# Patient Record
Sex: Male | Born: 1945 | Race: Black or African American | Hispanic: No | Marital: Single | Smoking: Former smoker
Health system: Southern US, Community
[De-identification: ages and names within clinical notes are randomized; demographics above are authoritative.]

## PROBLEM LIST (undated history)

## (undated) DIAGNOSIS — F518 Other sleep disorders not due to a substance or known physiological condition: Secondary | ICD-10-CM

## (undated) DIAGNOSIS — G20A1 Parkinson's disease without dyskinesia, without mention of fluctuations: Secondary | ICD-10-CM

## (undated) DIAGNOSIS — H8110 Benign paroxysmal vertigo, unspecified ear: Secondary | ICD-10-CM

## (undated) DIAGNOSIS — G6189 Other inflammatory polyneuropathies: Secondary | ICD-10-CM

## (undated) DIAGNOSIS — E559 Vitamin D deficiency, unspecified: Secondary | ICD-10-CM

## (undated) DIAGNOSIS — E785 Hyperlipidemia, unspecified: Secondary | ICD-10-CM

## (undated) DIAGNOSIS — M719 Bursopathy, unspecified: Secondary | ICD-10-CM

## (undated) DIAGNOSIS — M67919 Unspecified disorder of synovium and tendon, unspecified shoulder: Secondary | ICD-10-CM

## (undated) DIAGNOSIS — G4752 REM sleep behavior disorder: Secondary | ICD-10-CM

## (undated) DIAGNOSIS — R0602 Shortness of breath: Secondary | ICD-10-CM

## (undated) DIAGNOSIS — G20 Parkinson's disease: Secondary | ICD-10-CM

## (undated) DIAGNOSIS — G622 Polyneuropathy due to other toxic agents: Secondary | ICD-10-CM

## (undated) DIAGNOSIS — R739 Hyperglycemia, unspecified: Secondary | ICD-10-CM

## (undated) HISTORY — PX: ROTATOR CUFF REPAIR: SHX139

## (undated) HISTORY — DX: Benign paroxysmal vertigo, unspecified ear: H81.10

## (undated) HISTORY — DX: Hyperlipidemia, unspecified: E78.5

## (undated) HISTORY — DX: Unspecified disorder of synovium and tendon, unspecified shoulder: M67.919

## (undated) HISTORY — DX: Other inflammatory polyneuropathies: G61.89

## (undated) HISTORY — DX: Vitamin D deficiency, unspecified: E55.9

## (undated) HISTORY — DX: Other inflammatory polyneuropathies: G62.2

## (undated) HISTORY — DX: Shortness of breath: R06.02

## (undated) HISTORY — DX: Hyperglycemia, unspecified: R73.9

## (undated) HISTORY — DX: Unspecified disorder of synovium and tendon, unspecified shoulder: M71.9

## (undated) HISTORY — DX: Parkinson's disease: G20

## (undated) HISTORY — DX: Parkinson's disease without dyskinesia, without mention of fluctuations: G20.A1

## (undated) HISTORY — PX: APPENDECTOMY: SHX54

## (undated) HISTORY — DX: REM sleep behavior disorder: G47.52

## (undated) HISTORY — DX: Other sleep disorders not due to a substance or known physiological condition: F51.8

---

## 2005-12-11 ENCOUNTER — Emergency Department (HOSPITAL_COMMUNITY): Admission: EM | Admit: 2005-12-11 | Discharge: 2005-12-11 | Payer: Self-pay | Admitting: Emergency Medicine

## 2008-02-21 ENCOUNTER — Emergency Department (HOSPITAL_COMMUNITY): Admission: EM | Admit: 2008-02-21 | Discharge: 2008-02-21 | Payer: Self-pay | Admitting: Family Medicine

## 2008-10-14 ENCOUNTER — Emergency Department (HOSPITAL_COMMUNITY): Admission: EM | Admit: 2008-10-14 | Discharge: 2008-10-14 | Payer: Self-pay | Admitting: Family Medicine

## 2008-12-10 ENCOUNTER — Emergency Department (HOSPITAL_COMMUNITY): Admission: EM | Admit: 2008-12-10 | Discharge: 2008-12-10 | Payer: Self-pay | Admitting: Emergency Medicine

## 2009-03-22 ENCOUNTER — Emergency Department (HOSPITAL_COMMUNITY): Admission: EM | Admit: 2009-03-22 | Discharge: 2009-03-22 | Payer: Self-pay | Admitting: Family Medicine

## 2010-02-07 ENCOUNTER — Emergency Department (HOSPITAL_COMMUNITY): Admission: EM | Admit: 2010-02-07 | Discharge: 2010-02-07 | Payer: Self-pay | Admitting: Family Medicine

## 2010-03-24 ENCOUNTER — Emergency Department (HOSPITAL_COMMUNITY): Admission: EM | Admit: 2010-03-24 | Discharge: 2010-03-24 | Payer: Self-pay | Admitting: Emergency Medicine

## 2010-04-02 ENCOUNTER — Emergency Department (HOSPITAL_COMMUNITY): Admission: EM | Admit: 2010-04-02 | Discharge: 2010-04-02 | Payer: Self-pay | Admitting: Family Medicine

## 2010-06-18 ENCOUNTER — Encounter
Admission: RE | Admit: 2010-06-18 | Discharge: 2010-06-18 | Payer: Self-pay | Source: Home / Self Care | Attending: Sports Medicine | Admitting: Sports Medicine

## 2011-03-08 LAB — POCT URINALYSIS DIP (DEVICE)
Glucose, UA: NEGATIVE
Hgb urine dipstick: NEGATIVE
Ketones, ur: 15 — AB
Nitrite: POSITIVE — AB
Operator id: 247071
Protein, ur: >=300 — AB
Specific Gravity, Urine: 1.025
Urobilinogen, UA: 4 — ABNORMAL HIGH
pH: 5.5

## 2011-03-08 LAB — DIFFERENTIAL
Basophils Absolute: 0
Basophils Relative: 0
Eosinophils Absolute: 0
Eosinophils Relative: 0
Lymphocytes Relative: 10 — ABNORMAL LOW
Lymphs Abs: 0.3 — ABNORMAL LOW
Monocytes Absolute: 0.2
Monocytes Relative: 6
Neutro Abs: 2.2
Neutrophils Relative %: 84 — ABNORMAL HIGH

## 2011-03-08 LAB — CBC
HCT: 44.1
Hemoglobin: 15
MCHC: 34.1
MCV: 81.6
Platelets: 104 — ABNORMAL LOW
RBC: 5.41
RDW: 13
WBC: 2.7 — ABNORMAL LOW

## 2011-03-08 LAB — URINE CULTURE: Colony Count: 10000

## 2011-03-29 ENCOUNTER — Inpatient Hospital Stay (INDEPENDENT_AMBULATORY_CARE_PROVIDER_SITE_OTHER)
Admission: RE | Admit: 2011-03-29 | Discharge: 2011-03-29 | Disposition: A | Discharge status: Home / Self Care | Payer: Medicare Other | Source: Ambulatory Visit | Attending: Emergency Medicine | Admitting: Emergency Medicine

## 2011-03-29 DIAGNOSIS — R259 Unspecified abnormal involuntary movements: Secondary | ICD-10-CM

## 2011-08-09 ENCOUNTER — Other Ambulatory Visit: Payer: Self-pay | Admitting: Diagnostic Neuroimaging

## 2011-08-09 DIAGNOSIS — G20 Parkinson's disease: Secondary | ICD-10-CM

## 2011-09-09 ENCOUNTER — Ambulatory Visit
Admission: RE | Admit: 2011-09-09 | Discharge: 2011-09-09 | Disposition: A | Discharge status: Home / Self Care | Payer: Medicare Other | Source: Ambulatory Visit | Attending: Diagnostic Neuroimaging | Admitting: Diagnostic Neuroimaging

## 2011-09-09 DIAGNOSIS — G20 Parkinson's disease: Secondary | ICD-10-CM

## 2012-08-14 ENCOUNTER — Encounter: Payer: Self-pay | Admitting: Internal Medicine

## 2012-09-21 ENCOUNTER — Encounter: Payer: Self-pay | Admitting: Internal Medicine

## 2012-09-21 ENCOUNTER — Ambulatory Visit (AMBULATORY_SURGERY_CENTER): Payer: Medicare Other | Admitting: *Deleted

## 2012-09-21 VITALS — Ht 69.0 in | Wt 253.2 lb

## 2012-09-21 DIAGNOSIS — Z1211 Encounter for screening for malignant neoplasm of colon: Secondary | ICD-10-CM

## 2012-09-21 MED ORDER — PEG-KCL-NACL-NASULF-NA ASC-C 100 G PO SOLR: ORAL | Status: DC

## 2012-09-21 NOTE — Progress Notes (Signed)
No egg or soy allergy 

## 2012-10-05 ENCOUNTER — Encounter: Payer: Medicare Other | Admitting: Internal Medicine

## 2012-10-12 ENCOUNTER — Encounter: Payer: Medicare Other | Admitting: Internal Medicine

## 2012-10-18 ENCOUNTER — Encounter: Payer: Self-pay | Admitting: Internal Medicine

## 2012-10-18 ENCOUNTER — Ambulatory Visit (AMBULATORY_SURGERY_CENTER): Payer: Medicare Other | Admitting: Internal Medicine

## 2012-10-18 VITALS — BP 109/74 | HR 53 | Temp 96.2°F | Resp 13 | Ht 69.0 in | Wt 253.0 lb

## 2012-10-18 DIAGNOSIS — D126 Benign neoplasm of colon, unspecified: Secondary | ICD-10-CM

## 2012-10-18 DIAGNOSIS — Z1211 Encounter for screening for malignant neoplasm of colon: Secondary | ICD-10-CM

## 2012-10-18 MED ORDER — SODIUM CHLORIDE 0.9 % IV SOLN: 500.0000 mL | INTRAVENOUS | Status: DC

## 2012-10-18 MED ORDER — FLEET ENEMA 7-19 GM/118ML RE ENEM
1.0000 | ENEMA | Freq: Once | RECTAL | Status: AC
  Administered 2012-10-18: 1 via RECTAL

## 2012-10-18 NOTE — Progress Notes (Signed)
Patient did not have preoperative order for IV antibiotic SSI prophylaxis. (G8918)  Patient did not experience any of the following events: a burn prior to discharge; a fall within the facility; wrong site/side/patient/procedure/implant event; or a hospital transfer or hospital admission upon discharge from the facility. (G8907)  

## 2012-10-18 NOTE — Progress Notes (Signed)
Called to room to assist during endoscopic procedure.  Patient ID and intended procedure confirmed with present staff. Received instructions for my participation in the procedure from the performing physician.  

## 2012-10-18 NOTE — Progress Notes (Signed)
Pt. admininstered Fleets enema for further evacuation of intestinal contents, light green liquid expelled with no fecal matter present.

## 2012-10-18 NOTE — Patient Instructions (Signed)
YOU HAD AN ENDOSCOPIC PROCEDURE TODAY AT THE [facility]: Refer to the procedure report that was given to you for any specific questions about what was found during the examination.  If the procedure report does not answer your questions, please call your gastroenterologist to clarify.  If you requested that your care partner not be given the details of your procedure findings, then the procedure report has been included in a sealed envelope for you to review at your convenience later.  YOU SHOULD EXPECT: Some feelings of bloating in the abdomen. Passage of more gas than usual.  Walking can help get rid of the air that was put into your GI tract during the procedure and reduce the bloating. If you had a lower endoscopy (such as a colonoscopy or flexible sigmoidoscopy) you may notice spotting of blood in your stool or on the toilet paper. If you underwent a bowel prep for your procedure, then you may not have a normal bowel movement for a few days.  DIET: Your first meal following the procedure should be a light meal and then it is ok to progress to your normal diet.  A half-sandwich or bowl of soup is an example of a good first meal.  Heavy or fried foods are harder to digest and may make you feel nauseous or bloated.  Likewise meals heavy in dairy and vegetables can cause extra gas to form and this can also increase the bloating.  Drink plenty of fluids but you should avoid alcoholic beverages for 24 hours.  ACTIVITY: Your care partner should take you home directly after the procedure.  You should plan to take it easy, moving slowly for the rest of the day.  You can resume normal activity the day after the procedure however you should NOT DRIVE or use heavy machinery for 24 hours (because of the sedation medicines used during the test).    SYMPTOMS TO REPORT IMMEDIATELY: A gastroenterologist can be reached at any hour.  During normal business hours, 8:30 AM to 5:00 PM Monday through Friday,  call [phone].  After hours and on weekends, please call the GI answering service at [phone] who will take a message and have the physician on call contact you.   Following lower endoscopy (colonoscopy or flexible sigmoidoscopy):  Excessive amounts of blood in the stool  Significant tenderness or worsening of abdominal pains  Swelling of the abdomen that is new, acute  Fever of 100F or higher    FOLLOW UP: If any biopsies were taken you will be contacted by phone or by letter within the next 1-3 weeks.  Call your gastroenterologist if you have not heard about the biopsies in 3 weeks.  Our staff will call the home number listed on your records the next business day following your procedure to check on you and address any questions or concerns that you may have at that time regarding the information given to you following your procedure. This is a courtesy call and so if there is no answer at the home number and we have not heard from you through the emergency physician on call, we will assume that you have returned to your regular daily activities without incident.  SIGNATURES/CONFIDENTIALITY: You and/or your care partner have signed paperwork which will be entered into your electronic medical record.  These signatures attest to the fact that that the information above on your After Visit Summary has been reviewed and is understood.  Full responsibility of the confidentiality   of this discharge information lies with you and/or your care-partner.     Hold aspirin products and antiinflammatory products for one week  Information on polyps,diverticulosis,& high fiber diet given to you today

## 2012-10-18 NOTE — Op Note (Signed)
 [facility] [address] [place], [address]   COLONOSCOPY PROCEDURE REPORT  PATIENT: Theodore King, Theodore King  MR#: [id number] BIRTHDATE: 01-12-1946 , 67  yrs. old GENDER: Male ENDOSCOPIST: Beverley Fiedler, MD REFERRED AO:[id number] Reed, M.D. PROCEDURE DATE:  10/18/2012 PROCEDURE:   Colonoscopy with snare polypectomy ASA CLASS:   Class II INDICATIONS:average risk screening and first colonoscopy. MEDICATIONS: MAC sedation, administered by CRNA and propofol (Diprivan) 250mg  IV  DESCRIPTION OF PROCEDURE:   After the risks benefits and alternatives of the procedure were thoroughly explained, informed consent was obtained.  A digital rectal exam revealed no rectal mass.   The LB CF-[id number]  endoscope was introduced through the anus and advanced to the cecum, which was identified by both the appendix and ileocecal valve. No adverse events experienced. The quality of the prep was good, using MoviPrep  The instrument was then slowly withdrawn as the colon was fully examined.  COLON FINDINGS: Two sessile polyps measuring 8 and 4 mm in size were found in the ascending colon and at the hepatic flexure. Polypectomy was performed using cold snare.  All resections were complete and all polyp tissue was completely retrieved.   Moderate diverticulosis was noted in the descending colon and sigmoid colon. Retroflexed views revealed no abnormalities. The time to cecum=2 minutes 26 seconds.  Withdrawal time=10 minutes 00 seconds.  The scope was withdrawn and the procedure completed. COMPLICATIONS: There were no complications.  ENDOSCOPIC IMPRESSION: 1.   Two sessile polyps measuring 8 and 4 mm in size were found in the ascending colon and at the hepatic flexure; Polypectomy was performed using cold snare 2.   Moderate diverticulosis was noted in the descending colon and sigmoid colon  RECOMMENDATIONS: 1.  Hold aspirin, aspirin products, and anti-inflammatory medication for 1  week. 2.  Await pathology results 3.  High fiber diet 4.  If the polyps removed today are proven to be adenomatous (pre-cancerous) polyps, you will need a repeat colonoscopy in 5 years.  Otherwise you should continue to follow colorectal cancer screening guidelines for "routine risk" patients with colonoscopy in 10 years.  You will receive a letter within 1-2 weeks with the results of your biopsy as well as final recommendations.  Please call my office if you have not received a letter after 3 weeks.   eSigned:  Beverley Fiedler, MD 10/18/2012 10:18 AM  cc: The Patient

## 2012-10-19 ENCOUNTER — Telehealth: Payer: Self-pay | Admitting: *Deleted

## 2012-10-19 NOTE — Telephone Encounter (Signed)
  Follow up Call-  Call back number 10/18/2012  Post procedure Call Back phone  # [phone]  Permission to leave phone message Yes     Patient questions:  Do you have a fever, pain , or abdominal swelling? no Pain Score  0 *  Have you tolerated food without any problems? yes  Have you been able to return to your normal activities? yes  Do you have any questions about your discharge instructions: Diet   no Medications  no Follow up visit  no  Do you have questions or concerns about your Care? no  Actions: * If pain score is 4 or above: No action needed, pain <4.

## 2012-10-24 ENCOUNTER — Encounter: Payer: Self-pay | Admitting: Internal Medicine

## 2012-10-26 ENCOUNTER — Ambulatory Visit (INDEPENDENT_AMBULATORY_CARE_PROVIDER_SITE_OTHER): Payer: Medicare Other | Admitting: Diagnostic Neuroimaging

## 2012-10-26 ENCOUNTER — Encounter: Payer: Self-pay | Admitting: Diagnostic Neuroimaging

## 2012-10-26 VITALS — BP 151/74 | HR 71 | Temp 98.1°F | Ht 71.5 in | Wt 255.0 lb

## 2012-10-26 DIAGNOSIS — G20A1 Parkinson's disease without dyskinesia, without mention of fluctuations: Secondary | ICD-10-CM | POA: Insufficient documentation

## 2012-10-26 DIAGNOSIS — G20 Parkinson's disease: Secondary | ICD-10-CM

## 2012-10-26 NOTE — Progress Notes (Signed)
[facility]  PATIENT: Theodore King DOB: 1946/05/15  REFERRING CLINICIAN:  HISTORY FROM: patient  REASON FOR VISIT: routine follow up   HISTORICAL  CHIEF COMPLAINT:  Chief Complaint  Patient presents with  . Follow-up    tremors#6    HISTORY OF PRESENT ILLNESS:   UPDATE 10/26/12: Patient is doing extremity well since last visit. Increased carbidopa/levodopa dosing has essentially removed his tremors. He very rarely feels tremor in his left arm. Patient is competed in the senior games over past few months and won [employer] (including several gold). He won the [employer] after competing for the first time in his life. 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 Overall doing great.  UPDATE 05/10/12: Feels just a little better, continues with mild tremors.  Denies any falls.  He continues to work out 3-4 times per week.  His only complaint today is he continues to feel embarassed with his tremors.    UPDATE 03/06/12:  Feels like doing better with the Pramipixole.  Continues to have mild tremor.  Denies any falls.  Works out 3-4 days per week. He does feel that his tremors can improve more to help him to not feel embarrased.    UPDATE 01/04/12: Couldn't afford meds in end of june and for last 3 weeks of June was taking less meds than rx'd. Tremor worsened. Now has insurance and getting better tremor control, but still present. Still with int drooling. No constipation. No wearing off. No dyskinesias. No constipation.  UPDATE 10/27/11: Doing about the same. Not much benefit with carb/levo. No side effects. Still with tremor (rest and action). No falls. Works out 3x per week. Recent gold medal at [employer].  PRIOR HPI: 67 year old left-handed male with no past medical history, here for evaluation of tremor.  Since November 2012, patient has developed progressive tremor in his left hand greater than  right hand.  He also feels slower with fine finger movements and coordination.  His girlfriend is noticed short shuffling steps with walking.  Symptoms have been progressive over last few months.  Patient had an injury in 2012 with rotator cuff tear.  He thinks that since that time his symptoms have been worse.  He denies change in smell or taste, vivid dreams, hallucinations, constipation or swallowing difficulty.   REVIEW OF SYSTEMS: Full 14 system review of systems performed and notable only for nothing.  ALLERGIES: No Known Allergies  HOME MEDICATIONS: Outpatient Prescriptions Prior to Visit  Medication Sig Dispense Refill  . CARBIDOPA-LEVODOPA PO Take 1.5 tablets by mouth 3 (three) times daily.      . Cholecalciferol (VITAMIN D PO) Take by mouth daily.      Marland Kitchen PRAMIPEXOLE DIHYDROCHLORIDE PO Take by mouth. Takes 2 tablets in the a.m., 2 tablets at bedtime       No facility-administered medications prior to visit.     PAST MEDICAL HISTORY: Past Medical History  Diagnosis Date  . Parkinson's disease     PAST SURGICAL HISTORY: Past Surgical History  Procedure Laterality Date  . Appendectomy    . Rotator cuff repair      right    FAMILY HISTORY: Family History  Problem Relation Age of Onset  . Colon cancer Neg Hx   . Esophageal cancer Neg Hx   . Rectal cancer Neg Hx   . Stomach cancer Neg Hx   . Cancer Mother   . Cancer  Father   . Stroke Brother     SOCIAL HISTORY:  History   Social History  . Marital Status: Legally Separated    Spouse Name: N/A    Number of Children: N/A  . Years of Education: N/A   Occupational History  . Not on file.   Social History Main Topics  . Smoking status: Former Games developer  . Smokeless tobacco: Never Used  . Alcohol Use: No  . Drug Use: No  . Sexually Active: Not on file   Other Topics Concern  . Not on file   Social History Narrative  . No narrative on file     PHYSICAL EXAM  Filed Vitals:   10/26/12 1353  BP:  151/74  Pulse: 71  Temp: 98.1 F (36.7 C)  TempSrc: Oral  Height: 5' 11.5" (1.816 m)  Weight: 255 lb (115.667 kg)    Not recorded    Body mass index is 35.07 kg/(m^2).  GENERAL EXAM: Patient is in no distress  CARDIOVASCULAR: Regular rate and rhythm, no murmurs, no carotid bruits  NEUROLOGIC: MENTAL STATUS: awake, alert, language fluent, comprehension intact, naming intact;   MILD MASKED FACIES. BORDERLINE MYERSONS. CRANIAL NERVE: pupils equal and reactive to light, visual fields full to confrontation, extraocular muscles intact, no nystagmus, facial sensation and strength symmetric, uvula midline, shoulder shrug symmetric, tongue midline. MOTOR: NO TREMOR. MILD RIGIDITY IN LUE. NO BRADYKINESIA. Normal bulk and tone, full strength in the BUE, BLE. SENSORY: normal and symmetric to light touch. COORDINATION: finger-nose-finger, fine finger movements normal REFLEXES: deep tendon reflexes present and symmetric GAIT/STATION: SLIGHTLY STOOPED POSTURE. GOOD ARM SWING. GOOD TURN. SMOOTH. Narrow based gait.  DIAGNOSTIC DATA (LABS, IMAGING, TESTING) - I reviewed patient records, labs, notes, testing and imaging myself where available.  Lab Results  Component Value Date   WBC 2.7* 02/21/2008   HGB 15.0 02/21/2008   HCT 44.1 02/21/2008   MCV 81.6 02/21/2008   PLT 104* 02/21/2008   No results found for this basename: na,  k,  cl,  co2,  glucose,  bun,  creatinine,  calcium,  prot,  albumin,  ast,  alt,  alkphos,  bilitot,  gfrnonaa,  gfraa   No results found for this basename: CHOL,  HDL,  LDLCALC,  LDLDIRECT,  TRIG,  CHOLHDL   No results found for this basename: HGBA1C   No results found for this basename: VITAMINB12   No results found for this basename: TSH     ASSESSMENT AND PLAN  67 y.o. male with progressive resting tremor, bradykinesia, cogwheel rigidity, postural instability.  Doing well.   Dx: idiopathic Parkinson's disease.  PLAN: 1. Continue carbidopa/levodopa  25/100mg  1.5 tab PO TID 2. Continue pramipexole 2mg  BID    Suanne Marker, MD 10/26/2012, 2:35 PM Certified in Neurology, Neurophysiology and Neuroimaging  [facility] [address], [address] [place], [place] [address] [phone]

## 2012-10-26 NOTE — Patient Instructions (Signed)
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

## 2012-11-15 ENCOUNTER — Encounter: Payer: Self-pay | Admitting: *Deleted

## 2012-11-15 ENCOUNTER — Other Ambulatory Visit: Payer: Self-pay | Admitting: *Deleted

## 2012-11-15 DIAGNOSIS — G20 Parkinson's disease: Secondary | ICD-10-CM

## 2012-11-15 DIAGNOSIS — E559 Vitamin D deficiency, unspecified: Secondary | ICD-10-CM

## 2012-11-15 DIAGNOSIS — E785 Hyperlipidemia, unspecified: Secondary | ICD-10-CM

## 2012-11-16 ENCOUNTER — Other Ambulatory Visit: Payer: Medicare Other

## 2012-11-16 DIAGNOSIS — E785 Hyperlipidemia, unspecified: Secondary | ICD-10-CM

## 2012-11-16 DIAGNOSIS — G20 Parkinson's disease: Secondary | ICD-10-CM

## 2012-11-16 DIAGNOSIS — E559 Vitamin D deficiency, unspecified: Secondary | ICD-10-CM

## 2012-11-17 ENCOUNTER — Encounter: Payer: Self-pay | Admitting: *Deleted

## 2012-11-17 LAB — LIPID PANEL
Chol/HDL Ratio: 3.1 ratio units (ref 0.0–5.0)
Cholesterol, Total: 194 mg/dL (ref 100–199)
HDL: 63 mg/dL (ref >39)
LDL Calculated: 106 mg/dL — ABNORMAL HIGH (ref 0–99)
Triglycerides: 125 mg/dL (ref 0–149)
VLDL Cholesterol Cal: 25 mg/dL (ref 5–40)

## 2012-11-17 LAB — VITAMIN D 25 HYDROXY (VIT D DEFICIENCY, FRACTURES): Vit D, 25-Hydroxy: 35.5 ng/mL (ref 30.0–100.0)

## 2012-11-17 LAB — BASIC METABOLIC PANEL
BUN/Creatinine Ratio: 11 (ref 10–22)
BUN: 13 mg/dL (ref 8–27)
CO2: 27 mmol/L (ref 19–28)
Calcium: 9.5 mg/dL (ref 8.6–10.2)
Chloride: 103 mmol/L (ref 97–108)
Creatinine, Ser: 1.17 mg/dL (ref 0.76–1.27)
GFR calc Af Amer: 73 mL/min/{1.73_m2} (ref >59)
GFR calc non Af Amer: 63 mL/min/{1.73_m2} (ref >59)
Glucose: 100 mg/dL — ABNORMAL HIGH (ref 65–99)
Potassium: 4.2 mmol/L (ref 3.5–5.2)
Sodium: 141 mmol/L (ref 134–144)

## 2012-11-17 LAB — HEMOGLOBIN A1C
Est. average glucose Bld gHb Est-mCnc: 120 mg/dL
Hgb A1c MFr Bld: 5.8 % — ABNORMAL HIGH (ref 4.8–5.6)

## 2012-11-20 ENCOUNTER — Encounter: Payer: Self-pay | Admitting: Internal Medicine

## 2012-11-20 ENCOUNTER — Ambulatory Visit (INDEPENDENT_AMBULATORY_CARE_PROVIDER_SITE_OTHER): Payer: Medicare Other | Admitting: Internal Medicine

## 2012-11-20 VITALS — BP 136/82 | HR 68 | Temp 98.3°F | Resp 13 | Ht 71.5 in | Wt 251.0 lb

## 2012-11-20 DIAGNOSIS — G20A1 Parkinson's disease without dyskinesia, without mention of fluctuations: Secondary | ICD-10-CM

## 2012-11-20 DIAGNOSIS — R7309 Other abnormal glucose: Secondary | ICD-10-CM

## 2012-11-20 DIAGNOSIS — E559 Vitamin D deficiency, unspecified: Secondary | ICD-10-CM

## 2012-11-20 DIAGNOSIS — R739 Hyperglycemia, unspecified: Secondary | ICD-10-CM

## 2012-11-20 DIAGNOSIS — G20 Parkinson's disease: Secondary | ICD-10-CM

## 2012-11-20 DIAGNOSIS — E785 Hyperlipidemia, unspecified: Secondary | ICD-10-CM

## 2012-11-20 MED ORDER — GLUCOSE BLOOD VI STRP: ORAL_STRIP | Status: DC

## 2012-11-20 MED ORDER — ACCU-CHEK MULTICLIX LANCET DEV KIT: 1.0000 | PACK | Freq: Every day | Status: DC

## 2012-11-20 NOTE — Progress Notes (Signed)
Patient ID: Theodore King, male   DOB: 08-31-1945, 67 y.o.   MRN: [id number] Location:  [facility]   No Known Allergies  Chief Complaint  Patient presents with  . Medical Managment of Chronic Issues    HPI: Patient is a 67 y.o. black male seen in the office today for f/u of chronic diseases including his parkinson's and hyperglycemia. Having shakes in his left arm one time in 4-5 days Takes meds as directed Is taking vitamin D 1000 units daily.  Usually takes it in the morning with other pills.   Is watching diet and hba1c is improving.  Is going to [place].  LDL a little higher, but HDL is good.   He needs a meter to monitor his glucose.    Review of Systems:  Review of Systems  Constitutional: Negative for fever, chills and weight loss.  HENT: Negative for congestion.   Eyes: Negative for blurred vision.  Respiratory: Negative for shortness of breath.   Cardiovascular: Negative for chest pain.  Gastrointestinal: Negative for abdominal pain, constipation and blood in stool.  Genitourinary: Negative for dysuria.  Musculoskeletal: Negative for falls and myalgias.  Skin: Negative for rash.  Neurological: Positive for tremors. Negative for weakness.  Psychiatric/Behavioral: Negative for depression and memory loss. The patient is not nervous/anxious.      Past Medical History  Diagnosis Date  . Parkinson's disease   . Other and unspecified hyperlipidemia   . Unspecified vitamin D deficiency   . Other and unspecified hyperlipidemia   . 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 Unspecified vitamin D deficiency   . Disorders of bursae and tendons in shoulder region, unspecified     Past Surgical History  Procedure Laterality Date  . Appendectomy    . Rotator cuff repair      right    Social History:   reports that he has quit smoking. He has never used smokeless tobacco. He reports that he does not drink alcohol or use  illicit drugs.  Family History  Problem Relation Age of Onset  . Colon cancer Neg Hx   . Esophageal cancer Neg Hx   . Rectal cancer Neg Hx   . Stomach cancer Neg Hx   . Cancer Mother   . Cancer Father   . Stroke Brother   . Cancer Sister   . Cancer Sister     Medications: Patient's Medications  New Prescriptions   No medications on file  Previous Medications   CARBIDOPA-LEVODOPA PO    Take 1.5 tablets by mouth 3 (three) times daily.   CHOLECALCIFEROL (VITAMIN D PO)    Take by mouth daily.   PRAMIPEXOLE (MIRAPEX) 1 MG TABLET    Take 2 mg by mouth 2 (two) times daily.  Modified Medications   No medications on file  Discontinued Medications   No medications on file     Physical Exam: Filed Vitals:   11/20/12 1315  BP: 136/82  Pulse: 68  Temp: 98.3 F (36.8 C)  TempSrc: Oral  Resp: 13  Height: 5' 11.5" (1.816 m)  Weight: 251 lb (113.853 kg)   Physical Exam  Constitutional: He is oriented to person, place, and time. He appears well-developed and well-nourished. No distress.  HENT:  Head: Normocephalic and atraumatic.  Cardiovascular: Normal rate, regular rhythm, normal heart sounds and intact distal pulses.   Pulmonary/Chest: Effort normal and breath sounds normal. No respiratory distress.  Abdominal: Soft. Bowel sounds are normal.  He exhibits no distension. There is no tenderness.  Musculoskeletal: Normal range of motion. He exhibits no edema and no tenderness.  Neurological: He is alert and oriented to person, place, and time. He exhibits abnormal muscle tone.  No identifiable resting tremor today  Skin: Skin is warm and dry.     Labs reviewed: Basic Metabolic Panel:  Recent Labs  45/40/98 1104  NA 141  K 4.2  CL 103  CO2 27  GLUCOSE 100*  BUN 13  CREATININE 1.17  CALCIUM 9.5  Lipid Panel:  Recent Labs  11/16/12 1104  HDL 63  LDLCALC 106*  TRIG 125  CHOLHDL 3.1   Lab Results  Component Value Date   HGBA1C 5.8* 11/16/2012    Assessment/Plan 1. Hyperglycemia -reviewed diet and exercise recommendations - Lancets Misc. (ACCU-CHEK MULTICLIX LANCET DEV) KIT; 1 Device by Does not apply route daily with breakfast.  Dispense: 100 each; Refill: 3 - glucose blood (ACCU-CHEK AVIVA) test strip; Use as instructed  Dispense: 100 each; Refill: 12 - Hemoglobin A1c; Future - Basic metabolic panel; Future  2.  Parkinson's disease -cont sinemet through neurology -doing very well with diet, exercise and sinemet  3.  Hyperlipidemia goal LDL <100 -above goal but dieting and exercising and HDL is protective  4.  Vitamin D deficiency:  Cont vitamin D supplement  Labs/tests ordered: Orders Placed This Encounter  Procedures  . Hemoglobin A1c    Standing Status: Future     Number of Occurrences: 1     Standing Expiration Date: 08/20/2013  . Basic metabolic panel    Standing Status: Future     Number of Occurrences: 1     Standing Expiration Date: 08/20/2013   Next appt:  6 mos

## 2012-11-20 NOTE — Patient Instructions (Addendum)
2000 Calorie Diabetic Diet The 2000 calorie diabetic diet is designed for eating up to 2000 calories each day. Following this diet and making healthy meal choices can help improve overall health. It controls blood glucose (sugar) levels. It can also lower blood pressure and cholesterol. SERVING SIZES Measuring foods and serving sizes helps to make sure you are getting the right amount of food. The list below tells how big or small some common serving sizes are.  1 oz.........4 stacked dice.  3 oz.........Deck of cards.  1 tsp........Tip of little finger.  1 tbs........Thumb.  2 tbs........Golf ball.   cup.......Half of a fist.  1 cup........A fist. GUIDELINES FOR CHOOSING FOODS The goal of this diet is to eat a variety of foods and limit calories to 2000 each day. This can be done by choosing foods that are low in calories and fat. The diet also suggests eating small amounts of food often. Doing this helps control your blood glucose levels so they do not get too high or too low. Each meal or snack should contain a protein food source to help you feel more satisfied and to stabilize your blood glucose. Try to eat about the same amount of food around the same time each day. This includes weekend days, travel days, and days off work. Space your meals about 4 to 5 hours apart and add a snack between them if you wish. For example, a daily food plan could include breakfast, a morning snack, lunch, dinner, and an evening snack. Healthy meals and snacks include whole grains, vegetables, fruits, lean meats, poultry, fish, and dairy products. As you plan your meals, choose a variety of foods. Choose from the bread and starches, vegetables, fruit, dairy, and meat/protein groups. Examples of foods from each group are listed below with their suggested serving sizes. Use measuring cups and spoons to become familiar with what a healthy portion looks like. Bread and Starches Each serving equals 15 grams of  carbohydrates.  1 slice bread.   bagel.   cup or 1 cup cold cereal (unsweetened).   cup hot cereal or mashed potatoes.  1 small potato (size of a computer mouse).   cup cooked pasta or rice.   English muffin.  1 cup broth-based soup.  3 cups popcorn.  4 to 6 whole-wheat crackers.   cup cooked beans, peas, or corn. Vegetables Each serving equals 5 grams of carbohydrates.   cup cooked vegetables.  1 cup raw vegetables.   cup tomato juice. Fruit Each serving equals 15 grams of carbohydrates.  1 small apple, banana, or orange.  1  cup watermelon or strawberries.   cup applesauce (no sugar added).  2 tbs raisins.   banana.   cup unsweetened canned fruit.   cup unsweetened fruit juice. Dairy Each serving equals 12 to 15 grams of carbohydrates.  1 cup fat-free milk.  6 oz artificially sweetened yogurt.  1 cup buttermilk.  1 cup soy milk. Meat/Protein  1 large egg.  2 to 3 oz meat, poultry, or fish.   cup cottage cheese.  1 tbs peanut butter.   cup tofu.  1 oz cheese.   cup tuna canned in water. SAMPLE 2000 CALORIE DIET PLAN Breakfast  1 English muffin (2 carb servings).  Reduced fat cream cheese, 1 tbs.  1 scrambled egg.   grapefruit (1 carb serving).  Fat-free milk, 1 cup (1 carb serving). Morning Snack  Artificially sweetened yogurt, 6 oz (1 carb serving).  2 tbs chopped nuts.  1   small peach (1 carb serving). Lunch  Grilled chicken sandwich.  1 hamburger bun (2 carb servings).  2 oz chicken breast.  1 lettuce leaf.  2 slices tomato.  Reduced fat mayonnaise, 1 tbs.  Carrot sticks, 1 cup.  Celery, 1 cup.  1 small apple (1 carb serving).  Fat-free milk, 1 cup (1 carb serving). Afternoon Snack   cup low-fat cottage cheese.  1  cups strawberries (1 carb serving). Dinner  Steak fajitas.  2 oz lean steak.  1 whole-wheat tortilla, 8 inches (1  carb servings).  Shredded lettuce,   cup.  2 slices tomato.  Salsa,  cup.  Low-fat sour cream, 2 tbs.  Brown rice,  cup (1 carb serving).  1 small orange (1 carb serving). Evening Snack  4 reduced fat whole-wheat crackers (1 carb serving).  1 tbs peanut butter.  12 to 15 grapes (1 carb serving). MEAL PLAN Use this worksheet to help you make a daily meal plan based on the 2000 calorie diabetic diet suggestions. The total amount of carbohydrates in your meal or snack is more important than making sure you include all of the food groups at every meal or snack. If you are using this plan to help you control your blood glucose, you may interchange carbohydrate containing foods (dairy, starches, and fruits). Choose a variety of fresh foods of varying colors and flavors. You can choose from the following foods to build your day's meals:  11 Starches.  4 Vegetables.  3 Fruits.  3 Dairy.  8 oz Meat.  Up to 6 Fats. Your dietician can use this worksheet to help you decide how many servings and what types of foods are right for you. BREAKFAST Food Group and Servings / Food Choice Starches ___________________________________________ Dairy ______________________________________________ Fruit ______________________________________________ Meat ______________________________________________ Fat________________________________________________ LUNCH Food Group and Servings / Food Choice Starch _____________________________________________ Meat ______________________________________________ Vegetables _________________________________________ Fruit ______________________________________________ Dairy______________________________________________ Fat________________________________________________ Theodore King Food Group and Servings / Food  Choice Starch________________________________________________ Meat_________________________________________________ Fruit__________________________________________________ Theodore King Group and Servings / Food Choice Starches ____________________________________________ Meat _______________________________________________ Dairy _______________________________________________ Vegetables __________________________________________ Fruit ________________________________________________ Fat_________________________________________________ Lollie Sails Food Group and Servings / Food Choice Fruit _______________________________________________ Meat _______________________________________________ Starch ______________________________________________ DAILY TOTALS Starches ________________________ Vegetables ______________________ Fruit ___________________________ Dairy ___________________________ Meat ___________________________ Fat _____________________________ Document Released: 12/14/2004 Document Revised: 08/16/2011 Document Reviewed: 12/30/2008 [employer] Patient Information 2014 [employer]. Low Blood Sugar Low blood sugar (hypoglycemia) means that the level of sugar in your blood is lower than it should be. Signs of low blood sugar include:  Getting sweaty.  Feeling hungry.  Feeling dizzy or weak.  Feeling sleepier than normal.  Feeling nervous.  Headaches.  Having a fast heartbeat. Low blood sugar can happen fast and can be an emergency. Your doctor can do tests to check your blood sugar level. You can have low blood sugar and not have diabetes. HOME CARE  Check your blood sugar as told by your doctor. If it is less than 70 mg/dl or as told by your doctor, take 1 of the following:  3 to 4 glucose tablets.   cup clear juice.   cup soda pop, not diet.  1 cup milk.  5 to 6 hard candies.  Recheck blood sugar after 15 minutes. Repeat until it is at the right  level.  Eat a snack if it is more than 1 hour until the next meal.  Only take medicine as told by your doctor.  Do not skip meals. Eat on time.  Do not drink alcohol  except with meals.  Check your blood glucose before driving.  Check your blood glucose before and after exercise.  Always carry treatment with you, such as glucose pills.  Always wear a medical alert bracelet if you have diabetes. GET HELP RIGHT AWAY IF:   Your blood glucose goes below 70 mg/dl or as told by your doctor, and you:  Are confused.  Are not able to swallow.  Pass out (faint).  You cannot treat yourself. You may need someone to help you.  You have low blood sugar problems often.  You have problems from your medicines.  You are not feeling better after 3 to 4 days.  You have vision changes. MAKE SURE YOU:   Understand these instructions.  Will watch this condition.  Will get help right away if you are not doing well or get worse. Document Released: 08/18/2009 Document Revised: 08/16/2011 Document Reviewed: 08/18/2009 [facility] Patient Information 2014 [place], [place].

## 2013-05-17 ENCOUNTER — Other Ambulatory Visit: Payer: Medicare Other

## 2013-05-17 DIAGNOSIS — R739 Hyperglycemia, unspecified: Secondary | ICD-10-CM

## 2013-05-18 DIAGNOSIS — G20 Parkinson's disease: Secondary | ICD-10-CM | POA: Insufficient documentation

## 2013-05-18 DIAGNOSIS — E559 Vitamin D deficiency, unspecified: Secondary | ICD-10-CM | POA: Insufficient documentation

## 2013-05-18 DIAGNOSIS — G20A1 Parkinson's disease without dyskinesia, without mention of fluctuations: Secondary | ICD-10-CM

## 2013-05-18 DIAGNOSIS — R739 Hyperglycemia, unspecified: Secondary | ICD-10-CM | POA: Insufficient documentation

## 2013-05-18 DIAGNOSIS — E785 Hyperlipidemia, unspecified: Secondary | ICD-10-CM | POA: Insufficient documentation

## 2013-05-18 HISTORY — DX: Hyperlipidemia, unspecified: E78.5

## 2013-05-18 HISTORY — DX: Parkinson's disease without dyskinesia, without mention of fluctuations: G20.A1

## 2013-05-18 HISTORY — DX: Vitamin D deficiency, unspecified: E55.9

## 2013-05-18 HISTORY — DX: Parkinson's disease: G20

## 2013-05-18 HISTORY — DX: Hyperglycemia, unspecified: R73.9

## 2013-05-18 LAB — BASIC METABOLIC PANEL
BUN/Creatinine Ratio: 13 (ref 10–22)
BUN: 18 mg/dL (ref 8–27)
CO2: 23 mmol/L (ref 18–29)
Calcium: 9.6 mg/dL (ref 8.6–10.2)
Chloride: 100 mmol/L (ref 97–108)
Creatinine, Ser: 1.41 mg/dL — ABNORMAL HIGH (ref 0.76–1.27)
GFR calc Af Amer: 58 mL/min/{1.73_m2} — ABNORMAL LOW (ref >59)
GFR calc non Af Amer: 50 mL/min/{1.73_m2} — ABNORMAL LOW (ref >59)
Glucose: 104 mg/dL — ABNORMAL HIGH (ref 65–99)
Potassium: 4.8 mmol/L (ref 3.5–5.2)
Sodium: 141 mmol/L (ref 134–144)

## 2013-05-18 LAB — HEMOGLOBIN A1C
Est. average glucose Bld gHb Est-mCnc: 123 mg/dL
Hgb A1c MFr Bld: 5.9 % — ABNORMAL HIGH (ref 4.8–5.6)

## 2013-05-21 ENCOUNTER — Encounter: Payer: Self-pay | Admitting: Internal Medicine

## 2013-05-21 ENCOUNTER — Ambulatory Visit (INDEPENDENT_AMBULATORY_CARE_PROVIDER_SITE_OTHER): Payer: Medicare Other | Admitting: Internal Medicine

## 2013-05-21 VITALS — BP 134/80 | HR 76 | Temp 98.2°F | Resp 16 | Wt 246.6 lb

## 2013-05-21 DIAGNOSIS — G20 Parkinson's disease: Secondary | ICD-10-CM

## 2013-05-21 DIAGNOSIS — R0600 Dyspnea, unspecified: Secondary | ICD-10-CM

## 2013-05-21 DIAGNOSIS — R739 Hyperglycemia, unspecified: Secondary | ICD-10-CM

## 2013-05-21 DIAGNOSIS — E785 Hyperlipidemia, unspecified: Secondary | ICD-10-CM

## 2013-05-21 DIAGNOSIS — R7309 Other abnormal glucose: Secondary | ICD-10-CM

## 2013-05-21 DIAGNOSIS — R0609 Other forms of dyspnea: Secondary | ICD-10-CM

## 2013-05-21 DIAGNOSIS — Z23 Encounter for immunization: Secondary | ICD-10-CM

## 2013-05-21 DIAGNOSIS — G20A1 Parkinson's disease without dyskinesia, without mention of fluctuations: Secondary | ICD-10-CM

## 2013-05-21 MED ORDER — ALBUTEROL SULFATE HFA 108 (90 BASE) MCG/ACT IN AERS: 2.0000 | INHALATION_SPRAY | Freq: Four times a day (QID) | RESPIRATORY_TRACT | Status: DC | PRN

## 2013-05-21 MED ORDER — ZOSTER VACCINE LIVE 19400 UNT/0.65ML ~~LOC~~ SOLR: 0.6500 mL | Freq: Once | SUBCUTANEOUS | Status: DC

## 2013-05-21 NOTE — Progress Notes (Signed)
Patient ID: Theodore King, male   DOB: July 20, 1945, 67 y.o.   MRN: [id number]   Location:  [facility]  No Known Allergies  Chief Complaint  Patient presents with  . Medical Managment of Chronic Issues    6 month f/u with labs printed  . other    gets SOB when walking up hills  . Immunizations    will get RX for shingles.    HPI: Patient is a 67 y.o. black male seen in the office today for f/u of chronic medical conditions.   Moved since last visit.    Admits to dyspnea on exertion going up hills or if walks fast. This is new.  Started right before thanksgiving.  Brother uses inhaler.  Feels tight in his lungs.  No correlation with cold.  Heart pumps fast.  Still exercising daily to every other day.    No difficulty sleeping at night, but does wake up and urinate.  Gets up early am.  Has to watch tv then and can't go back to sleep.    Review of Systems:  ROS  Past Medical History  Diagnosis Date  . Parkinson's disease   . Other and unspecified hyperlipidemia   . Unspecified vitamin D deficiency   . Other and unspecified hyperlipidemia   . 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 Unspecified vitamin D deficiency   . Disorders of bursae and tendons in shoulder region, unspecified     Past Surgical History  Procedure Laterality Date  . Appendectomy    . Rotator cuff repair      right    Social History:   reports that he has quit smoking. He has never used smokeless tobacco. He reports that he does not drink alcohol or use illicit drugs.  Family History  Problem Relation Age of Onset  . Colon cancer Neg Hx   . Esophageal cancer Neg Hx   . Rectal cancer Neg Hx   . Stomach cancer Neg Hx   . Cancer Mother   . Cancer Father   . Stroke Brother   . Cancer Sister   . Cancer Sister     Medications: Patient's Medications  New Prescriptions   No medications on file  Previous Medications   CARBIDOPA-LEVODOPA PO     Take 1.5 tablets by mouth 3 (three) times daily.   CHOLECALCIFEROL (VITAMIN D PO)    Take by mouth daily.   GLUCOSE BLOOD (ACCU-CHEK AVIVA) TEST STRIP    Use as instructed   LANCETS MISC. (ACCU-CHEK MULTICLIX LANCET DEV) KIT    1 Device by Does not apply route daily with breakfast.   PRAMIPEXOLE (MIRAPEX) 1 MG TABLET    Take 2 mg by mouth 2 (two) times daily.   ZOSTER VACCINE LIVE, PF, (ZOSTAVAX) 30865 UNT/0.65ML INJECTION    Inject 0.65 mLs into the skin once.  Modified Medications   No medications on file  Discontinued Medications   No medications on file   Physical Exam: Filed Vitals:   05/21/13 1603  BP: 134/80  Pulse: 76  Temp: 98.2 F (36.8 C)  TempSrc: Oral  Resp: 16  Weight: 246 lb 9.6 oz (111.857 kg)  SpO2: 96%  Physical Exam  Labs reviewed: Basic Metabolic Panel:  Recent Labs  78/46/96 1104 05/17/13 0807  NA 141 141  K 4.2 4.8  CL 103 100  CO2 27 23  GLUCOSE 100* 104*  BUN 13 18  CREATININE 1.17 1.41*  CALCIUM 9.5 9.6  Lipid Panel:  Recent Labs  11/16/12 1104  HDL 63  LDLCALC 106*  TRIG 125  CHOLHDL 3.1   Lab Results  Component Value Date   HGBA1C 5.9* 05/17/2013   Assessment/Plan 1. Need for prophylactic vaccination and inoculation against other combinations of diseases - zoster vaccine live, PF, (ZOSTAVAX) 56213 UNT/0.65ML injection; Inject 19,400 Units into the skin once.  Dispense: 1 each  2. Paralysis agitans -tremors doing great, had a touch today in right hand--took midday pills slightly late and thinks that was why  3. Hyperlipidemia LDL goal < 100 -last LDL above goal, will recheck before next appt  4. Hyperglycemia -sugar average quite good right now, but will continue to monitor--is exercising  -renal function slightly worse, will check for protein in urine  5.  Dyspnea on exertion -try albuterol inhaler, but if not getting better, will send for stress test  Labs/tests ordered:  Urine microalbumin today;  Hba1c, bmp, FLP  before next visit in 6 mos Next appt:  6 mos

## 2013-05-22 LAB — MICROALBUMIN / CREATININE URINE RATIO
Creatinine, Ur: 158.7 mg/dL (ref 22.0–328.0)
MICROALB/CREAT RATIO: 2 mg/g creat (ref 0.0–30.0)
Microalbumin, Urine: 3.2 ug/mL (ref 0.0–17.0)

## 2013-05-24 ENCOUNTER — Encounter: Payer: Self-pay | Admitting: *Deleted

## 2013-06-04 ENCOUNTER — Other Ambulatory Visit: Payer: Self-pay | Admitting: *Deleted

## 2013-06-04 MED ORDER — ALBUTEROL SULFATE HFA 108 (90 BASE) MCG/ACT IN AERS: 2.0000 | INHALATION_SPRAY | Freq: Four times a day (QID) | RESPIRATORY_TRACT | Status: DC | PRN

## 2013-06-12 ENCOUNTER — Other Ambulatory Visit: Payer: Self-pay | Admitting: *Deleted

## 2013-06-12 DIAGNOSIS — R0609 Other forms of dyspnea: Principal | ICD-10-CM

## 2013-06-12 MED ORDER — ALBUTEROL SULFATE HFA 108 (90 BASE) MCG/ACT IN AERS: 2.0000 | INHALATION_SPRAY | Freq: Four times a day (QID) | RESPIRATORY_TRACT | Status: DC | PRN

## 2013-06-22 ENCOUNTER — Other Ambulatory Visit: Payer: Self-pay | Admitting: *Deleted

## 2013-06-22 DIAGNOSIS — R0609 Other forms of dyspnea: Principal | ICD-10-CM

## 2013-06-22 MED ORDER — ALBUTEROL SULFATE HFA 108 (90 BASE) MCG/ACT IN AERS: INHALATION_SPRAY | RESPIRATORY_TRACT | Status: DC

## 2013-07-04 ENCOUNTER — Telehealth: Payer: Self-pay | Admitting: Diagnostic Neuroimaging

## 2013-07-04 ENCOUNTER — Encounter: Payer: Self-pay | Admitting: Nurse Practitioner

## 2013-07-04 ENCOUNTER — Ambulatory Visit (INDEPENDENT_AMBULATORY_CARE_PROVIDER_SITE_OTHER): Payer: Medicare Other | Admitting: Nurse Practitioner

## 2013-07-04 VITALS — BP 130/82 | HR 62 | Temp 97.6°F | Wt 251.6 lb

## 2013-07-04 DIAGNOSIS — G20 Parkinson's disease: Secondary | ICD-10-CM

## 2013-07-04 DIAGNOSIS — G20A1 Parkinson's disease without dyskinesia, without mention of fluctuations: Secondary | ICD-10-CM

## 2013-07-04 MED ORDER — ALBUTEROL SULFATE HFA 108 (90 BASE) MCG/ACT IN AERS: 2.0000 | INHALATION_SPRAY | Freq: Four times a day (QID) | RESPIRATORY_TRACT | Status: DC | PRN

## 2013-07-04 NOTE — Telephone Encounter (Signed)
Patient's wife called to ask if patient can be seen sooner by Dr. [name] because he has been having sleeping problems. Please call patient back.

## 2013-07-04 NOTE — Telephone Encounter (Signed)
Patient's wife requesting sooner appt. Than 11/05/13, which is yearly f/u. Last OV was 10/26/12. Patient's wife states that patient is having sleeping problems. You see him for Parkinson disease.  Would you like for [place] to set up an appt.? Please advise.

## 2013-07-04 NOTE — Telephone Encounter (Signed)
Yes, please setup appt sooner with me or Jeani Hawking. -VRP

## 2013-07-04 NOTE — Patient Instructions (Signed)
May be a side effect from your neurology medications; speak with them about this at your next visit

## 2013-07-04 NOTE — Progress Notes (Signed)
Patient ID: Theodore King, male   DOB: 08-29-1945, 68 y.o.   MRN: [id number]    No Known Allergies  Chief Complaint  Patient presents with  . Acute Visit    problems with nerves that is affecting his sleep (talking in sleep & waking up during the night)    HPI: Patient is a 68 y.o. male seen in the office today for "a nervous problem" Dr Mariea Clonts is his PCP; was having problems with shortness of breath and was called in albuterol but this has not been approved from insurance  Reports he falls asleep okay but will up startled (like he has to fight someone) in the middle of the night and then he is able to go back to sleep right away-- this happens 2-3 times a week for a couple of months; thought it was going to get better but it has not.  No medication changes, no changes in routine or activities Denies anxiety or nervousness; no depression.  Drinks caffeine during the day but none in the evening; no beer or wine at night No PTSD or flashbacks Tremor is better  Review of Systems:  Review of Systems  Constitutional: Negative for fever, chills and malaise/fatigue.  Respiratory: Negative for cough.   Cardiovascular: Negative for chest pain and palpitations.  Gastrointestinal: Negative for heartburn, abdominal pain, diarrhea and constipation.  Genitourinary: Negative for dysuria, urgency and frequency.  Musculoskeletal: Negative for myalgias.  Skin: Negative.   Neurological: Negative for dizziness, tingling, weakness and headaches.  Psychiatric/Behavioral: Negative for depression. The patient is not nervous/anxious and does not have insomnia.      Past Medical History  Diagnosis Date  . Parkinson's disease   . Other and unspecified hyperlipidemia   . Unspecified vitamin D deficiency   . Other and unspecified hyperlipidemia   . 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 Unspecified vitamin D deficiency   . Disorders of bursae and tendons in shoulder region, unspecified     Past Surgical History  Procedure Laterality Date  . Appendectomy    . Rotator cuff repair      right   Social History:   reports that he has quit smoking. He has never used smokeless tobacco. He reports that he does not drink alcohol or use illicit drugs.  Family History  Problem Relation Age of Onset  . Colon cancer Neg Hx   . Esophageal cancer Neg Hx   . Rectal cancer Neg Hx   . Stomach cancer Neg Hx   . Cancer Mother   . Cancer Father   . Stroke Brother   . Cancer Sister   . Cancer Sister     Medications: Patient's Medications  New Prescriptions   No medications on file  Previous Medications   ALBUTEROL (PROAIR HFA) 108 (90 BASE) MCG/ACT INHALER    Inhale two puffs into lungs every 6 hours as needed for wheezing or shortness of breath   CARBIDOPA-LEVODOPA PO    Take 1.5 tablets by mouth 3 (three) times daily.   CHOLECALCIFEROL (VITAMIN D PO)    Take by mouth daily.   GLUCOSE BLOOD (ACCU-CHEK AVIVA) TEST STRIP    Use as instructed   LANCETS MISC. (ACCU-CHEK MULTICLIX LANCET DEV) KIT    1 Device by Does not apply route daily with breakfast.   PRAMIPEXOLE (MIRAPEX) 1 MG TABLET    Take 2 mg by mouth 2 (two) times daily.  Modified Medications   No medications on file  Discontinued Medications  ZOSTER VACCINE LIVE, PF, (ZOSTAVAX) 48185 UNT/0.65ML INJECTION    Inject 19,400 Units into the skin once.     Physical Exam:  Filed Vitals:   07/04/13 1301  BP: 130/82  Pulse: 62  Temp: 97.6 F (36.4 C)  TempSrc: Oral  Weight: 251 lb 9.6 oz (114.125 kg)  SpO2: 98%    Physical Exam  Constitutional: He is well-developed, well-nourished, and in no distress.  HENT:  Head: Normocephalic and atraumatic.  Mouth/Throat: Oropharynx is clear and moist. No oropharyngeal exudate.  Eyes: Conjunctivae and EOM are normal. Pupils are equal, round, and reactive to light.  Neck: Normal range of motion. Neck supple. No thyromegaly present.  Cardiovascular: Normal rate, regular rhythm  and normal heart sounds.   Pulmonary/Chest: Effort normal and breath sounds normal.  Abdominal: Soft. Bowel sounds are normal.  Musculoskeletal: He exhibits no edema and no tenderness.  Neurological: He is alert.  Skin: Skin is warm and dry.  Psychiatric: Affect normal.     Labs reviewed: Basic Metabolic Panel:  Recent Labs  11/16/12 1104 05/17/13 0807  NA 141 141  K 4.2 4.8  CL 103 100  CO2 27 23  GLUCOSE 100* 104*  BUN 13 18  CREATININE 1.17 1.41*  CALCIUM 9.5 9.6   Lipid Panel:  Recent Labs  11/16/12 1104  HDL 63  LDLCALC 106*  TRIG 125  CHOLHDL 3.1   TSH: No results found for this basename: TSH,  in the last 8760 hours A1C: No components found with this basename: A1C,    Assessment/Plan 1. Parkinson's disease -possible nightmares causing startle affect -not effecting every day life- able to go to sleep good and fall back asleep after episodes -question if this is a side effect from medication -will monitor -to discuss with neurologist at follow up appt or if this gets worse and effects sleep

## 2013-07-05 NOTE — Telephone Encounter (Signed)
Patient has appt on 07/06/13 with Jeani Hawking, NP.

## 2013-07-06 ENCOUNTER — Encounter: Payer: Self-pay | Admitting: Nurse Practitioner

## 2013-07-06 ENCOUNTER — Ambulatory Visit (INDEPENDENT_AMBULATORY_CARE_PROVIDER_SITE_OTHER): Payer: Medicare Other | Admitting: Nurse Practitioner

## 2013-07-06 ENCOUNTER — Encounter (INDEPENDENT_AMBULATORY_CARE_PROVIDER_SITE_OTHER): Payer: Self-pay

## 2013-07-06 VITALS — BP 130/72 | HR 60 | Ht 71.5 in | Wt 253.0 lb

## 2013-07-06 DIAGNOSIS — G4752 REM sleep behavior disorder: Secondary | ICD-10-CM

## 2013-07-06 DIAGNOSIS — G20 Parkinson's disease: Secondary | ICD-10-CM

## 2013-07-06 DIAGNOSIS — G20A1 Parkinson's disease without dyskinesia, without mention of fluctuations: Secondary | ICD-10-CM

## 2013-07-06 HISTORY — DX: REM sleep behavior disorder: G47.52

## 2013-07-06 MED ORDER — CLONAZEPAM 0.5 MG PO TABS: 0.5000 mg | ORAL_TABLET | Freq: Every day | ORAL | Status: DC

## 2013-07-06 NOTE — Patient Instructions (Signed)
Start with 1 tablet at bedtime.  If needed, after a few days you may increase to 2 tablets.  Side effects could be dizziness and morning drowsiness.  Keep follow up appointment already scheduled.

## 2013-07-06 NOTE — Progress Notes (Signed)
PATIENT: Theodore King DOB: August 22, 1945   REASON FOR VISIT: acute follow up for sleep problem HISTORY FROM: patient  HISTORY OF PRESENT ILLNESS: UPDATE 07/06/13 (LL):  Patient's gf requesting sooner appt than 11/05/13, which is yearly f/u. Last OV was 10/26/12. Patient's girlfriend states that patient is having sleeping problems, moving and talking in his sleep.  This is causing patient concern, he is worried that he may accidentally hurt girlfriend during sleep.  He would like treatment. Otherwise no changes, minor tremor in hand, not bothersome. Tolerating medications well.  UPDATE 10/26/12: Patient is doing extremity well since last visit. Increased carbidopa/levodopa dosing has essentially removed his tremors. He very rarely feels tremor in his left arm. Patient is competed in the senior games over past few months and won [employer] (including several gold). He won the [employer] after competing for the first time in his life. 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 Overall doing great.  UPDATE 05/10/12: Feels just a little better, continues with mild tremors. Denies any falls. He continues to work out 3-4 times per week. His only complaint today is he continues to feel embarassed with his tremors.  UPDATE 03/06/12: Feels like doing better with the Pramipixole. Continues to have mild tremor. Denies any falls. Works out 3-4 days per week. He does feel that his tremors can improve more to help him to not feel embarrased.  UPDATE 01/04/12: Couldn't afford meds in end of june and for last 3 weeks of June was taking less meds than rx'd. Tremor worsened. Now has insurance and getting better tremor control, but still present. Still with int drooling. No constipation. No wearing off. No dyskinesias. No constipation.  UPDATE 10/27/11: Doing about the same. Not much benefit with carb/levo. No side effects. Still with tremor (rest and  action). No falls. Works out 3x per week. Recent gold medal at [employer].  PRIOR HPI: 68 year old left-handed male with no past medical history, here for evaluation of tremor.  Since November 2012, patient has developed progressive tremor in his left hand greater than right hand. He also feels slower with fine finger movements and coordination. His girlfriend is noticed short shuffling steps with walking. Symptoms have been progressive over last few months. Patient had an injury in 2012 with rotator cuff tear. He thinks that since that time his symptoms have been worse. He denies change in smell or taste, vivid dreams, hallucinations, constipation or swallowing difficulty.   REVIEW OF SYSTEMS: Full 14 system review of systems performed and notable only for shortness of breath, apnea, snoring, sleep talking, tremors  ALLERGIES: No Known Allergies  HOME MEDICATIONS: Outpatient Prescriptions Prior to Visit  Medication Sig Dispense Refill  . albuterol (PROVENTIL HFA;VENTOLIN HFA) 108 (90 BASE) MCG/ACT inhaler Inhale 2 puffs into the lungs every 6 (six) hours as needed for wheezing or shortness of breath.  1 Inhaler  2  . CARBIDOPA-LEVODOPA PO Take 1.5 tablets by mouth 3 (three) times daily.      . Cholecalciferol (VITAMIN D PO) Take by mouth daily.      Marland Kitchen glucose blood (ACCU-CHEK AVIVA) test strip Use as instructed  100 each  12  . Lancets Misc. (ACCU-CHEK MULTICLIX LANCET DEV) KIT 1 Device by Does not apply route daily with breakfast.  100 each  3  . pramipexole (MIRAPEX) 1 MG tablet Take 2 mg by mouth 2 (two) times daily.  No facility-administered medications prior to visit.    PAST MEDICAL HISTORY: Past Medical History  Diagnosis Date  . Parkinson's disease   . Other and unspecified hyperlipidemia   . Unspecified vitamin D deficiency   . Other and unspecified hyperlipidemia   . 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 Unspecified vitamin D deficiency   . Disorders of  bursae and tendons in shoulder region, unspecified     PAST SURGICAL HISTORY: Past Surgical History  Procedure Laterality Date  . Appendectomy    . Rotator cuff repair      right    FAMILY HISTORY: Family History  Problem Relation Age of Onset  . Colon cancer Neg Hx   . Esophageal cancer Neg Hx   . Rectal cancer Neg Hx   . Stomach cancer Neg Hx   . Cancer Mother   . Cancer Father   . Stroke Brother   . Cancer Sister   . Cancer Sister     SOCIAL HISTORY: History   Social History  . Marital Status: Legally Separated    Spouse Name: N/A    Number of Children: 3  . Years of Education: 12th   Occupational History  .     Social History Main Topics  . Smoking status: Former Research scientist (life sciences)  . Smokeless tobacco: Never Used  . Alcohol Use: No  . Drug Use: No  . Sexual Activity: No   Other Topics Concern  . Not on file   Social History Narrative  . No narrative on file     PHYSICAL EXAM  Filed Vitals:   07/06/13 1126  BP: 130/72  Pulse: 60  Height: 5' 11.5" (1.816 m)  Weight: 253 lb (114.76 kg)   Body mass index is 34.8 kg/(m^2).  Generalized: Well developed, in no acute distress  Head: normocephalic and atraumatic. Oropharynx benign  Neck: Supple, no carotid bruits  Cardiac: Regular rate rhythm, no murmur  Musculoskeletal: No deformity   Neurological examination  MENTAL STATUS: awake, alert, language fluent, comprehension intact, naming intact; MILD MASKED FACIES. BORDERLINE MYERSONS.  CRANIAL NERVE: pupils equal and reactive to light, visual fields full to confrontation, extraocular muscles intact, no nystagmus, facial sensation and strength symmetric, uvula midline, shoulder shrug symmetric, tongue midline.  MOTOR: NO TREMOR. MILD RIGIDITY IN LUE. NO BRADYKINESIA. Normal bulk and tone, full strength in the BUE, BLE.  SENSORY: normal and symmetric to light touch.  COORDINATION: finger-nose-finger, fine finger movements normal  REFLEXES: deep tendon reflexes  present and symmetric  GAIT/STATION: SLIGHTLY STOOPED POSTURE. GOOD ARM SWING. GOOD TURN. SMOOTH. Narrow based gait.  ASSESSMENT AND PLAN 68 y.o. male with progressive resting tremor, bradykinesia, cogwheel rigidity, postural instability. Requested acute visit today for sleep disorder, he is worried for girlfriend's safety during sleep due to moving, kicking during sleep.  He also talks in his sleep. Dx: idiopathic Parkinson's disease.   PLAN:  Start Klonopin 0.5 mg at bedtime.  May increase to 1 mg after 1 week if needed. Continue carbidopa/levodopa 25/147m 1.5 tab PO TID  Continue pramipexole 274mBID  Keep yearly follow up appointment already scheduled.  Meds ordered this encounter  Medications  . clonazePAM (KLONOPIN) 0.5 MG tablet    Sig: Take 1 tablet (0.5 mg total) by mouth at bedtime. After 1 week you may increase to 2 tablets if needed.    Dispense:  60 tablet    Refill:  5    Order Specific Question:  Supervising Provider    Answer:  PELeta Baptist  [facility] R [[id number]]   Philmore Pali, MSN, NP-C 07/06/2013, 12:20 PM [facility] [address] [place], [place] [address] [phone]  Note: This document was prepared with digital dictation and possible smart phrase technology. Any transcriptional errors that result from this process are unintentional.

## 2013-07-23 ENCOUNTER — Other Ambulatory Visit: Payer: Self-pay | Admitting: Diagnostic Neuroimaging

## 2013-07-24 ENCOUNTER — Telehealth: Payer: Self-pay | Admitting: Diagnostic Neuroimaging

## 2013-07-24 NOTE — Telephone Encounter (Signed)
Error

## 2013-08-08 ENCOUNTER — Other Ambulatory Visit: Payer: Self-pay

## 2013-08-08 MED ORDER — PRAMIPEXOLE DIHYDROCHLORIDE 1 MG PO TABS: 2.0000 mg | ORAL_TABLET | Freq: Two times a day (BID) | ORAL | Status: DC

## 2013-09-06 ENCOUNTER — Encounter: Payer: Self-pay | Admitting: Internal Medicine

## 2013-09-06 ENCOUNTER — Ambulatory Visit (INDEPENDENT_AMBULATORY_CARE_PROVIDER_SITE_OTHER): Payer: Medicare Other | Admitting: Internal Medicine

## 2013-09-06 VITALS — BP 134/80 | HR 64 | Temp 97.2°F | Wt 248.6 lb

## 2013-09-06 DIAGNOSIS — G20A1 Parkinson's disease without dyskinesia, without mention of fluctuations: Secondary | ICD-10-CM

## 2013-09-06 DIAGNOSIS — IMO0002 Reserved for concepts with insufficient information to code with codable children: Secondary | ICD-10-CM

## 2013-09-06 DIAGNOSIS — E559 Vitamin D deficiency, unspecified: Secondary | ICD-10-CM

## 2013-09-06 DIAGNOSIS — R739 Hyperglycemia, unspecified: Secondary | ICD-10-CM

## 2013-09-06 DIAGNOSIS — R7309 Other abnormal glucose: Secondary | ICD-10-CM

## 2013-09-06 DIAGNOSIS — G20 Parkinson's disease: Secondary | ICD-10-CM

## 2013-09-06 DIAGNOSIS — F518 Other sleep disorders not due to a substance or known physiological condition: Secondary | ICD-10-CM

## 2013-09-06 DIAGNOSIS — E785 Hyperlipidemia, unspecified: Secondary | ICD-10-CM

## 2013-09-06 HISTORY — DX: Other sleep disorders not due to a substance or known physiological condition: F51.8

## 2013-09-06 NOTE — Patient Instructions (Signed)
Come in next week for your labs FASTING.  Only water or black coffee.

## 2013-09-06 NOTE — Progress Notes (Signed)
Patient ID: Theodore King, male   DOB: 1945-12-14, 68 y.o.   MRN: [id number]   Location:  [facility]  Code Status: full code   No Known Allergies  Chief Complaint  Patient presents with  . Follow-up    F/U on sleep problems, medication given at last OV is slightly helping     HPI: Patient is a 68 y.o. black male seen in the office today for medical mgt chronic diseases.  Only new problem is feeling off balance--happens every once in a while.  Is not predictable or full time.  Not lightheadedness, not a feeling of room spinning. Feels a little wobbly right when he gets started to walk.  Started on klonopin at bedtime for his anxiety and sleep by neurology--helping some.  Is up to 107m at bedtime.  Discussed trying 3 at bedtime.  Discussed that parkinson's meds may cause the dreams.  Tremors only come once in a while for a couple of mins then go away.    Still working out and playing basketball at [employer]  Still bowling also.  Says he doesn't feel a day over 89yo.  Senior games start 4/7--[facility] [employer] [employer]  Lost 5 lbs and proud of that.  Avoids salt.  BP is good.  Has gym where he lives and uses it--treadmill and weights.    Review of Systems:  Review of Systems  Constitutional: Negative for fever, chills and weight loss.  HENT: Negative for congestion.   Eyes: Negative for blurred vision.  Respiratory: Negative for shortness of breath.   Cardiovascular: Negative for chest pain.  Gastrointestinal: Negative for abdominal pain and constipation.  Genitourinary: Negative for dysuria, urgency and frequency.  Musculoskeletal: Negative for falls.  Skin: Negative for rash.  Neurological: Positive for tremors. Negative for weakness.  Psychiatric/Behavioral: Negative for depression and memory loss. The patient has insomnia.      Past Medical History  Diagnosis Date  . Parkinson's disease   . Other and unspecified  hyperlipidemia   . Unspecified vitamin D deficiency   . Other and unspecified hyperlipidemia   . 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 Unspecified vitamin D deficiency   . Disorders of bursae and tendons in shoulder region, unspecified     Past Surgical History  Procedure Laterality Date  . Appendectomy    . Rotator cuff repair      right    Social History:   reports that he has quit smoking. He has never used smokeless tobacco. He reports that he does not drink alcohol or use illicit drugs.  Family History  Problem Relation Age of Onset  . Colon cancer Neg Hx   . Esophageal cancer Neg Hx   . Rectal cancer Neg Hx   . Stomach cancer Neg Hx   . Cancer Mother   . Cancer Father   . Stroke Brother   . Cancer Sister   . Cancer Sister     Medications: Patient's Medications  New Prescriptions   No medications on file  Previous Medications   ALBUTEROL (PROVENTIL HFA;VENTOLIN HFA) 108 (90 BASE) MCG/ACT INHALER    Inhale 2 puffs into the lungs every 6 (six) hours as needed for wheezing or shortness of breath.   CARBIDOPA-LEVODOPA (SINEMET IR) 25-100 MG PER TABLET    TAKE ONE AND ONE-HALF TABLETS BY MOUTH THREE TIMES DAILY 30 MINUTES BEFORE MEALS   CARBIDOPA-LEVODOPA PO    Take 1.5 tablets  by mouth 3 (three) times daily.   CHOLECALCIFEROL (VITAMIN D PO)    Take by mouth daily.   CLONAZEPAM (KLONOPIN) 0.5 MG TABLET    Take 1 tablet (0.5 mg total) by mouth at bedtime. After 1 week you may increase to 2 tablets if needed.   GLUCOSE BLOOD (ACCU-CHEK AVIVA) TEST STRIP    Use as instructed   LANCETS MISC. (ACCU-CHEK MULTICLIX LANCET DEV) KIT    1 Device by Does not apply route daily with breakfast.   PRAMIPEXOLE (MIRAPEX) 1 MG TABLET    Take 2 tablets (2 mg total) by mouth 2 (two) times daily.  Modified Medications   No medications on file  Discontinued Medications   No medications on file     Physical Exam: Filed Vitals:   09/06/13 1049  BP: 134/80  Pulse: 64    Temp: 97.2 F (36.2 C)  TempSrc: Oral  Weight: 248 lb 9.6 oz (112.764 kg)  SpO2: 98%  Physical Exam  Constitutional: He is oriented to person, place, and time. He appears well-developed and well-nourished. No distress.  Cardiovascular: Normal rate, regular rhythm, normal heart sounds and intact distal pulses.   Pulmonary/Chest: Effort normal and breath sounds normal. No respiratory distress.  Abdominal: Soft. Bowel sounds are normal. He exhibits no distension and no mass. There is no tenderness.  Musculoskeletal: Normal range of motion.  Neurological: He is alert and oriented to person, place, and time.  cogwheeling rigidity  Psychiatric: He has a normal mood and affect.     Labs reviewed: Basic Metabolic Panel:  Recent Labs  11/16/12 1104 05/17/13 0807  NA 141 141  K 4.2 4.8  CL 103 100  CO2 27 23  GLUCOSE 100* 104*  BUN 13 18  CREATININE 1.17 1.41*  CALCIUM 9.5 9.6  Lipid Panel:  Recent Labs  11/16/12 1104  HDL 63  LDLCALC 106*  TRIG 125  CHOLHDL 3.1   Lab Results  Component Value Date   HGBA1C 5.9* 05/17/2013     Assessment/Plan 1. Parkinson's disease -continues klonopin, sinemet, mirapex for this -follows with Dr. Rexene Alberts  2. Hyperlipidemia LDL goal < 100 -cont diet and exercise for this  3. Hyperglycemia -f/u hba1c, cont diet and exercise--was counseled  4. Unspecified vitamin D deficiency -cont vitamin D  5. Abnormal dreams -suspect due to mirapex  Next appt:  3 mos

## 2013-09-12 ENCOUNTER — Other Ambulatory Visit: Payer: Medicare Other

## 2013-09-12 DIAGNOSIS — E785 Hyperlipidemia, unspecified: Secondary | ICD-10-CM

## 2013-09-12 DIAGNOSIS — R739 Hyperglycemia, unspecified: Secondary | ICD-10-CM

## 2013-09-12 DIAGNOSIS — G20 Parkinson's disease: Secondary | ICD-10-CM

## 2013-09-13 ENCOUNTER — Telehealth: Payer: Self-pay | Admitting: *Deleted

## 2013-09-13 LAB — BASIC METABOLIC PANEL
BUN/Creatinine Ratio: 15 (ref 10–22)
BUN: 15 mg/dL (ref 8–27)
CO2: 21 mmol/L (ref 18–29)
Calcium: 9.4 mg/dL (ref 8.6–10.2)
Chloride: 104 mmol/L (ref 97–108)
Creatinine, Ser: 1.03 mg/dL (ref 0.76–1.27)
GFR calc Af Amer: 85 mL/min/{1.73_m2} (ref >59)
GFR calc non Af Amer: 74 mL/min/{1.73_m2} (ref >59)
Glucose: 115 mg/dL — ABNORMAL HIGH (ref 65–99)
Potassium: 4.2 mmol/L (ref 3.5–5.2)
Sodium: 141 mmol/L (ref 134–144)

## 2013-09-13 LAB — LIPID PANEL
Chol/HDL Ratio: 3 ratio units (ref 0.0–5.0)
Cholesterol, Total: 176 mg/dL (ref 100–199)
HDL: 59 mg/dL (ref >39)
LDL Calculated: 100 mg/dL — ABNORMAL HIGH (ref 0–99)
Triglycerides: 84 mg/dL (ref 0–149)
VLDL Cholesterol Cal: 17 mg/dL (ref 5–40)

## 2013-09-13 LAB — HEMOGLOBIN A1C
Est. average glucose Bld gHb Est-mCnc: 117 mg/dL
Hgb A1c MFr Bld: 5.7 % — ABNORMAL HIGH (ref 4.8–5.6)

## 2013-09-13 NOTE — Telephone Encounter (Signed)
Spoke with patient regarding labs, informed him that labs has improved some and he stated that he did'nt have any questions at this time.

## 2013-09-13 NOTE — Telephone Encounter (Signed)
Message copied by RICE, SHARON L on Thu Sep 13, 2013  8:20 AM ------      Message from: [place], [place] L      Created: Thu Sep 13, 2013  7:59 AM       All labs have improved a little bit since last time.  I know he has lost weight which has helped him.  No changes ------

## 2013-10-30 ENCOUNTER — Ambulatory Visit: Payer: Medicare Other | Admitting: Diagnostic Neuroimaging

## 2013-11-05 ENCOUNTER — Ambulatory Visit: Payer: Medicare Other | Admitting: Diagnostic Neuroimaging

## 2013-11-15 ENCOUNTER — Encounter: Payer: Self-pay | Admitting: Diagnostic Neuroimaging

## 2013-11-15 ENCOUNTER — Telehealth: Payer: Self-pay | Admitting: Diagnostic Neuroimaging

## 2013-11-15 NOTE — Telephone Encounter (Signed)
Left message for patient regarding rescheduling 12/03/13 appointment per Dr. Gladstone Lighter schedule, printed and mailed letter with new appointment time.

## 2013-11-20 ENCOUNTER — Other Ambulatory Visit: Payer: Self-pay | Admitting: Diagnostic Neuroimaging

## 2013-11-27 ENCOUNTER — Telehealth: Payer: Self-pay | Admitting: Diagnostic Neuroimaging

## 2013-11-27 NOTE — Telephone Encounter (Signed)
Patient came to the office today requesting refills of pramipexole. He only has 6 tablets left. Please call to advise.

## 2013-11-27 NOTE — Telephone Encounter (Signed)
A one year supply was sent to the pharmacy in March.  I called the patient back.  Got no answer, left message asking that he follow up with the pharmacy regarding refill and call us back if needed.

## 2013-12-03 ENCOUNTER — Ambulatory Visit: Payer: Medicare Other | Admitting: Diagnostic Neuroimaging

## 2013-12-06 ENCOUNTER — Ambulatory Visit: Payer: Medicare Other | Admitting: Internal Medicine

## 2013-12-06 DIAGNOSIS — Z0289 Encounter for other administrative examinations: Secondary | ICD-10-CM

## 2013-12-10 ENCOUNTER — Ambulatory Visit (INDEPENDENT_AMBULATORY_CARE_PROVIDER_SITE_OTHER): Payer: Medicare Other | Admitting: Internal Medicine

## 2013-12-10 ENCOUNTER — Encounter: Payer: Self-pay | Admitting: Internal Medicine

## 2013-12-10 VITALS — BP 132/78 | HR 70 | Temp 98.1°F | Resp 10 | Wt 250.0 lb

## 2013-12-10 DIAGNOSIS — S2341XA Sprain of ribs, initial encounter: Secondary | ICD-10-CM

## 2013-12-10 DIAGNOSIS — S29011A Strain of muscle and tendon of front wall of thorax, initial encounter: Secondary | ICD-10-CM

## 2013-12-10 NOTE — Patient Instructions (Signed)
Muscle Strain A muscle strain (pulled muscle) happens when a muscle is stretched beyond normal length. It happens when a sudden, violent force stretches your muscle too far. Usually, a few of the fibers in your muscle are torn. Muscle strain is common in athletes. Recovery usually takes 1-2 weeks. Complete healing takes 5-6 weeks.  HOME CARE   Follow the PRICE method of treatment to help your injury get better. Do this the first 2-3 days after the injury:  Protect. Protect the muscle to keep it from getting injured again.  Rest. Limit your activity and rest the injured body part.  Ice. Put ice in a plastic bag. Place a towel between your skin and the bag. Then, apply the ice and leave it on from 15-20 minutes each hour. After the third day, switch to moist heat packs.  Compression. Use a splint or elastic bandage on the injured area for comfort. Do not put it on too tightly.  Elevate. Keep the injured body part above the level of your heart.  Only take medicine as told by your doctor.  Warm up before doing exercise to prevent future muscle strains. GET HELP IF:   You have more pain or puffiness (swelling) in the injured area.  You feel numbness, tingling, or notice a loss of strength in the injured area. MAKE SURE YOU:   Understand these instructions.  Will watch your condition.  Will get help right away if you are not doing well or get worse. Document Released: 03/02/2008 Document Revised: 03/14/2013 Document Reviewed: 12/21/2012 [facility] Patient Information 2015 [place], [place]. This information is not intended to replace advice given to you by your health care provider. Make sure you discuss any questions you have with your health care provider.  Other options:  Muscle rub like theragesic or generic of it.                            Aleve daily is ok for a couple of weeks, but then stop so it doesn't affect your kidneys.   Use heated rice bag or heating pad for pain as this has  helped so far.                         Doing stretches of your right side should help.

## 2013-12-10 NOTE — Progress Notes (Signed)
Patient ID: Theodore King, male   DOB: Jul 31, 1945, 68 y.o.   MRN: [id number]   Location:  [facility] [place]  No Known Allergies  Chief Complaint  Patient presents with  . Acute Visit    Right side pain x 3-4 days no known injury     HPI: Patient is a 68 y.o.  seen in the office today for acute visit right side pain.  Over right lower abdomen, worst in the morning and could hardly get out of bed.  Eased off a little today.  No known injury.  Cut back on exercise to 2-3 days per week now.  Worsened by movements side to side and when tries to get out of shower and out of chair.  Has to bend over a little before he straightens out.  Has taken some aleve for this with a little relief.  Bending over and twisting to the right side make pain worse.  No correlation with food intake.  No falls.  Not bad pain.  Labels it 4-5/10.   Did put heat on it with rice in a sock--did ease pain well.    Review of Systems:  Review of Systems  Constitutional: Negative for fever and malaise/fatigue.  HENT: Negative for congestion.   Eyes: Negative for blurred vision.  Respiratory: Negative for shortness of breath.   Cardiovascular: Negative for chest pain and leg swelling.  Gastrointestinal: Negative for heartburn.  Genitourinary: Negative for dysuria.  Musculoskeletal: Positive for myalgias. Negative for falls.       Right lower ribs  Skin: Negative for rash.  Neurological: Positive for tremors.  Endo/Heme/Allergies: Does not bruise/bleed easily.  Psychiatric/Behavioral: Negative for depression and memory loss.    Past Medical History  Diagnosis Date  . Parkinson's disease   . Other and unspecified hyperlipidemia   . Unspecified vitamin D deficiency   . Other and unspecified hyperlipidemia   . 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 Unspecified vitamin D deficiency   . Disorders of bursae and tendons in shoulder region, unspecified     Past  Surgical History  Procedure Laterality Date  . Appendectomy    . Rotator cuff repair      right    Social History:   reports that he has quit smoking. He has never used smokeless tobacco. He reports that he does not drink alcohol or use illicit drugs.  Family History  Problem Relation Age of Onset  . Colon cancer Neg Hx   . Esophageal cancer Neg Hx   . Rectal cancer Neg Hx   . Stomach cancer Neg Hx   . Cancer Mother   . Cancer Father   . Stroke Brother   . Cancer Sister   . Cancer Sister     Medications: Patient's Medications  New Prescriptions   No medications on file  Previous Medications   ALBUTEROL (PROVENTIL HFA;VENTOLIN HFA) 108 (90 BASE) MCG/ACT INHALER    Inhale 2 puffs into the lungs every 6 (six) hours as needed for wheezing or shortness of breath.   CARBIDOPA-LEVODOPA (SINEMET IR) 25-100 MG PER TABLET    TAKE ONE AND ONE-HALF TABLETS BY MOUTH THREE TIMES DAILY 30 MINUTES BEFORE MEALS   CARBIDOPA-LEVODOPA PO    Take 1.5 tablets by mouth 3 (three) times daily.   CHOLECALCIFEROL (VITAMIN D PO)    Take by mouth daily.   CLONAZEPAM (KLONOPIN) 0.5 MG TABLET    Take 1 tablet (0.5 mg total) by  mouth at bedtime. After 1 week you may increase to 2 tablets if needed.   GLUCOSE BLOOD (ACCU-CHEK AVIVA) TEST STRIP    Use as instructed   LANCETS MISC. (ACCU-CHEK MULTICLIX LANCET DEV) KIT    1 Device by Does not apply route daily with breakfast.   PRAMIPEXOLE (MIRAPEX) 1 MG TABLET    Take 2 tablets (2 mg total) by mouth 2 (two) times daily.  Modified Medications   No medications on file  Discontinued Medications   No medications on file     Physical Exam: Filed Vitals:   12/10/13 1515  BP: 132/78  Pulse: 70  Temp: 98.1 F (36.7 C)  TempSrc: Oral  Resp: 10  Weight: 250 lb (113.399 kg)  SpO2: 96%  Physical Exam  Constitutional: He is oriented to person, place, and time. He appears well-developed and well-nourished. No distress.  Cardiovascular: Normal rate, regular  rhythm, normal heart sounds and intact distal pulses.   Pulmonary/Chest: Effort normal and breath sounds normal. No respiratory distress.  Abdominal: Soft. Bowel sounds are normal. He exhibits no distension and no mass. There is no tenderness.  Musculoskeletal: He exhibits tenderness. He exhibits no edema.  Right sided rib tenderness worse with sidebending and rotation right and flexion  Neurological: He is alert and oriented to person, place, and time.  Skin: Skin is warm and dry.     Labs reviewed: Basic Metabolic Panel:  Recent Labs  05/17/13 0807 09/12/13 0952  NA 141 141  K 4.8 4.2  CL 100 104  CO2 23 21  GLUCOSE 104* 115*  BUN 18 15  CREATININE 1.41* 1.03  CALCIUM 9.6 9.4  Lipid Panel:  Recent Labs  09/12/13 0952  HDL 59  LDLCALC 100*  TRIG 84  CHOLHDL 3.0   Lab Results  Component Value Date   HGBA1C 5.7* 09/12/2013    Assessment/Plan 1. Intercostal muscle strain, initial encounter -see pt instructions -advised stretching, heat, ice, short term aleve  Labs/tests ordered:  None today Next appt:  3 mos.

## 2013-12-13 NOTE — Progress Notes (Signed)
Patient ID: Theodore King, male   DOB: 08-19-1945, 68 y.o.   MRN: [id number] CPT code was incorrect. Pt was seen in the office. Proper cpt code:  [id number].

## 2014-01-10 NOTE — Telephone Encounter (Signed)
Noted  

## 2014-01-29 ENCOUNTER — Encounter: Payer: Self-pay | Admitting: Diagnostic Neuroimaging

## 2014-01-29 ENCOUNTER — Ambulatory Visit (INDEPENDENT_AMBULATORY_CARE_PROVIDER_SITE_OTHER): Payer: Medicare Other | Admitting: Diagnostic Neuroimaging

## 2014-01-29 VITALS — BP 125/76 | HR 62 | Ht 71.5 in | Wt 247.8 lb

## 2014-01-29 DIAGNOSIS — G20 Parkinson's disease: Secondary | ICD-10-CM

## 2014-01-29 DIAGNOSIS — G4752 REM sleep behavior disorder: Secondary | ICD-10-CM

## 2014-01-29 MED ORDER — CARBIDOPA-LEVODOPA 25-100 MG PO TABS: 1.5000 | ORAL_TABLET | Freq: Three times a day (TID) | ORAL | Status: DC

## 2014-01-29 MED ORDER — CLONAZEPAM 0.5 MG PO TABS: 1.5000 mg | ORAL_TABLET | Freq: Every day | ORAL | Status: DC

## 2014-01-29 MED ORDER — PRAMIPEXOLE DIHYDROCHLORIDE 1 MG PO TABS: 2.0000 mg | ORAL_TABLET | Freq: Two times a day (BID) | ORAL | Status: DC

## 2014-01-29 NOTE — Patient Instructions (Signed)
INCREASE clonazepam to 1.5mg  at bedtime.  Continue carbidopa/levodopa and pramipexole.

## 2014-01-29 NOTE — Progress Notes (Signed)
PATIENT: Theodore King DOB: 11-21-1945   REASON FOR VISIT: acute follow up for sleep problem HISTORY FROM: patient  HISTORY OF PRESENT ILLNESS:  UPDATE 01/29/14: Since last visit, doing well. 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 Tremor is stable. Tolerating carb/levo and pramipexole.  UPDATE 07/06/13 (LL):  Patient's gf requesting sooner appt than 11/05/13, which is yearly f/u. Last OV was 10/26/12. Patient's girlfriend states that patient is having sleeping problems, moving and talking in his sleep.  This is causing patient concern, he is worried that he may accidentally hurt girlfriend during sleep.  He would like treatment. Otherwise no changes, minor tremor in hand, not bothersome. Tolerating medications well.  UPDATE 10/26/12: Patient is doing extremity well since last visit. Increased carbidopa/levodopa dosing has essentially removed his tremors. He very rarely feels tremor in his left arm. Patient is competed in the senior games over past few months and won [employer] several gold). He won the [employer] competing for the first time in his life. 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 Overall doing great.   UPDATE 05/10/12: Feels just a little better, continues with mild tremors. Denies any falls. He continues to work out 3-4 times per week. His only complaint today is he continues to feel embarassed with his tremors.   UPDATE 03/06/12: Feels like doing better with the Pramipixole. Continues to have mild tremor. Denies any falls. Works out 3-4 days per week. He does feel that his tremors can improve more to help him to not feel embarrased.   UPDATE 01/04/12: Couldn't afford meds in end of june and for last 3 weeks of June was taking less meds than rx'd. Tremor worsened. Now has insurance and getting better tremor control, but still  present. Still with int drooling. No constipation. No wearing off. No dyskinesias. No constipation.   UPDATE 10/27/11: Doing about the same. Not much benefit with carb/levo. No side effects. Still with tremor (rest and action). No falls. Works out 3x per week. Recent gold medal at [employer]  PRIOR HPI: [age]ear old left-handed male with no past medical history, here for evaluation of tremor. Since November 2012, patient has developed progressive tremor in his left hand greater than right hand. He also feels slower with fine finger movements and coordination. His girlfriend is noticed short shuffling steps with walking. Symptoms have been progressive over last few months. Patient had an injury in 2012 with rotator cuff tear. He thinks that since that time his symptoms have been worse. He denies change in smell or taste, vivid dreams, hallucinations, constipation or swallowing difficulty.   REVIEW OF SYSTEMS: Full 14 system review of systems performed and notable only for  apnea, snoring, acting out dreams shift work sleep talking.  ALLERGIES: No Known Allergies  HOME MEDICATIONS: Outpatient Prescriptions Prior to Visit  Medication Sig Dispense Refill  . albuterol (PROVENTIL HFA;VENTOLIN HFA) 108 (90 BASE) MCG/ACT inhaler Inhale 2 puffs into the lungs every 6 (six) hours as needed for wheezing or shortness of breath.  1 Inhaler  2  . Cholecalciferol (VITAMIN D PO) Take by mouth daily.      . Marland Kitchenlucose blood (ACCU-CHEK AVIVA) test strip Use as instructed  100 each  12  . Lancets Misc. (ACCU-CHEK MULTICLIX LANCET DEV) KIT 1 Device by Does not apply route daily with breakfast.  100 each  3  . carbidopa-levodopa (SINEMET IR) 25-100 MG per tablet TAKE ONE AND ONE-HALF TABLETS BY MOUTH THREE TIMES DAILY 30 MINUTES BEFORE MEALS  405 tablet  0  . CARBIDOPA-LEVODOPA PO Take 1.5 tablets by mouth 3 (three) times daily.      . clonazePAM (KLONOPIN) 0.5 MG tablet Take 1 tablet (0.5 mg total) by mouth at  bedtime. After 1 week you may increase to 2 tablets if needed.  60 tablet  5  . pramipexole (MIRAPEX) 1 MG tablet Take 2 tablets (2 mg total) by mouth 2 (two) times daily.  360 tablet  3   No facility-administered medications prior to visit.    PAST MEDICAL HISTORY: Past Medical History  Diagnosis Date  . Parkinson's disease   . Other and unspecified hyperlipidemia   . Unspecified vitamin D deficiency   . Other and unspecified hyperlipidemia   . 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 Unspecified vitamin D deficiency   . Disorders of bursae and tendons in shoulder region, unspecified     PAST SURGICAL HISTORY: Past Surgical History  Procedure Laterality Date  . Appendectomy    . Rotator cuff repair      right    FAMILY HISTORY: Family History  Problem Relation Age of Onset  . Colon cancer Neg Hx   . Esophageal cancer Neg Hx   . Rectal cancer Neg Hx   . Stomach cancer Neg Hx   . Cancer Mother   . Cancer Father   . Stroke Brother   . Cancer Sister   . Cancer Sister     SOCIAL HISTORY: History   Social History  . Marital Status: Legally Separated    Spouse Name: N/A    Number of Children: 3  . Years of Education: 12th   Occupational History  .     Social History Main Topics  . Smoking status: Former Research scientist (life sciences)  . Smokeless tobacco: Never Used  . Alcohol Use: No  . Drug Use: No  . Sexual Activity: No   Other Topics Concern  . Not on file   Social History Narrative  . No narrative on file     PHYSICAL EXAM  Filed Vitals:   01/29/14 1435  BP: 125/76  Pulse: 62  Height: 5' 11.5" (1.816 m)  Weight: 247 lb 12.8 oz (112.401 kg)   Body mass index is 34.08 kg/(m^2).  Generalized: Well developed, in no acute distress  Neck: Supple, no carotid bruits  Cardiac: Regular rate rhythm, no murmur   Neurological examination  MENTAL STATUS: awake, alert, language fluent, comprehension intact, naming intact; MASKED FACIES. BORDERLINE MYERSONS.    CRANIAL NERVE: pupils equal and reactive to light, visual fields full to confrontation, extraocular muscles intact, no nystagmus, facial sensation and strength symmetric, uvula midline, shoulder shrug symmetric, tongue midline.  MOTOR: POSTURAL TREMOR IN RUE; NO RIGIDITY. NO BRADYKINESIA. Normal bulk and tone, full strength in the BUE, BLE.  SENSORY: normal and symmetric to light touch.  COORDINATION: finger-nose-finger, fine finger movements normal  REFLEXES: deep tendon reflexes present and symmetric  GAIT/STATION: SLIGHTLY STOOPED POSTURE. DECR ARM SWING. MILD TREMOR IN LUE WITH WALKING. SMOOTH TURING. Narrow based gait.  ASSESSMENT AND PLAN 68 y.o. male with progressive resting tremor, bradykinesia, cogwheel rigidity, postural instability, consistent with parkinson's disease (with favorable response to carb/levo and pramipexole). Also with REM behavior sleep disorder sxs and some benefit with clonazepam.   PLAN:  - increase clonazepam to 1.3m qhs - continue carbidopa/levodopa  25/164m 1.5 tab PO TID  - continue pramipexole 210mBID   Meds ordered this encounter  Medications  . carbidopa-levodopa (SINEMET IR) 25-100 MG per tablet    Sig: Take 1.5 tablets by mouth 3 (three) times daily.    Dispense:  540 tablet    Refill:  4  . clonazePAM (KLONOPIN) 0.5 MG tablet    Sig: Take 3 tablets (1.5 mg total) by mouth at bedtime.    Dispense:  90 tablet    Refill:  5  . pramipexole (MIRAPEX) 1 MG tablet    Sig: Take 2 tablets (2 mg total) by mouth 2 (two) times daily.    Dispense:  360 tablet    Refill:  4   Return in about 6 months (around 08/01/2014).   VIPenni BombardMD 01/09/62/89373:3:42M Certified in Neurology, Neurophysiology and Neuroimaging  [facility] [address] River[place] [phone]

## 2014-02-07 NOTE — Patient Instructions (Signed)
Patient and spouse came in office with meds. They are confused about medication and dosage. I went over med instructions with patient and spouse per Dr. Gladstone Lighter last office notes. Spouse stated patient was taking just 1 tab of carbidopa-levodopa (SINEMET IR) 25-100 MG per tablet not 1.5 tab as instructed. Patient and spouse agreed they now understand how med should be taken.

## 2014-03-14 ENCOUNTER — Ambulatory Visit: Payer: Medicare Other | Admitting: Internal Medicine

## 2014-03-14 DIAGNOSIS — Z0289 Encounter for other administrative examinations: Secondary | ICD-10-CM

## 2014-08-01 ENCOUNTER — Ambulatory Visit: Payer: Self-pay | Admitting: Diagnostic Neuroimaging

## 2014-08-02 ENCOUNTER — Telehealth: Payer: Self-pay | Admitting: *Deleted

## 2014-08-02 ENCOUNTER — Encounter: Payer: Self-pay | Admitting: *Deleted

## 2014-08-02 NOTE — Telephone Encounter (Signed)
Called and left a message for the pt letting him know that his appt would be cancelled since Dr. [name] will not be here 08/08/14 and he needed to call back and reschedule his appt. Letter was sent as well. If he calls back, please reschedule him

## 2014-08-08 ENCOUNTER — Ambulatory Visit: Payer: Self-pay | Admitting: Diagnostic Neuroimaging

## 2014-08-13 ENCOUNTER — Encounter: Payer: Self-pay | Admitting: Diagnostic Neuroimaging

## 2014-08-13 ENCOUNTER — Ambulatory Visit (INDEPENDENT_AMBULATORY_CARE_PROVIDER_SITE_OTHER): Payer: Medicare Other | Admitting: Diagnostic Neuroimaging

## 2014-08-13 VITALS — BP 124/70 | HR 80 | Wt 253.2 lb

## 2014-08-13 DIAGNOSIS — R29898 Other symptoms and signs involving the musculoskeletal system: Secondary | ICD-10-CM

## 2014-08-13 DIAGNOSIS — R1032 Left lower quadrant pain: Secondary | ICD-10-CM

## 2014-08-13 DIAGNOSIS — G20 Parkinson's disease: Secondary | ICD-10-CM

## 2014-08-13 DIAGNOSIS — G4752 REM sleep behavior disorder: Secondary | ICD-10-CM | POA: Diagnosis not present

## 2014-08-13 DIAGNOSIS — R1030 Lower abdominal pain, unspecified: Secondary | ICD-10-CM

## 2014-08-13 MED ORDER — CARBIDOPA-LEVODOPA 25-100 MG PO TABS: 1.5000 | ORAL_TABLET | Freq: Three times a day (TID) | ORAL | Status: DC

## 2014-08-13 MED ORDER — CLONAZEPAM 0.5 MG PO TABS: 1.5000 mg | ORAL_TABLET | Freq: Every day | ORAL | Status: DC

## 2014-08-13 MED ORDER — PRAMIPEXOLE DIHYDROCHLORIDE 1 MG PO TABS: 2.0000 mg | ORAL_TABLET | Freq: Two times a day (BID) | ORAL | Status: DC

## 2014-08-13 MED ORDER — CYCLOBENZAPRINE HCL 10 MG PO TABS: 10.0000 mg | ORAL_TABLET | Freq: Three times a day (TID) | ORAL | Status: DC | PRN

## 2014-08-13 NOTE — Progress Notes (Signed)
PATIENT: Theodore King DOB: 01-11-1946   REASON FOR VISIT: follow up HISTORY FROM: patient  Chief Complaint  Patient presents with  . Follow-up    Parkinson's Disease     HISTORY OF PRESENT ILLNESS:  UPDATE 08/13/14: Since last visit, doing well from PD standpoint. Tremor and gait stable. Some new left groin pulling sensation x 3 weeks, without reported injury. Also pain/weakness in legs with standing for long time.  UPDATE 01/29/14: Since last visit, doing well. 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 Tremor is stable. Tolerating carb/levo and pramipexole.  UPDATE 07/06/13 (LL):  Patient's gf requesting sooner appt than 11/05/13, which is yearly f/u. Last OV was 10/26/12. Patient's girlfriend states that patient is having sleeping problems, moving and talking in his sleep.  This is causing patient concern, he is worried that he may accidentally hurt girlfriend during sleep.  He would like treatment. Otherwise no changes, minor tremor in hand, not bothersome. Tolerating medications well.  UPDATE 10/26/12: Patient is doing extremity well since last visit. Increased carbidopa/levodopa dosing has essentially removed his tremors. He very rarely feels tremor in his left arm. Patient is competed in the senior games over past few months and won [employer] several gold). He won the [employer] competing for the first time in his life. 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 Overall doing great.   UPDATE 05/10/12: Feels just a little better, continues with mild tremors. Denies any falls. He continues to work out 3-4 times per week. His only complaint today is he continues to feel embarassed with his tremors.   UPDATE 03/06/12: Feels like doing better with the Pramipixole. Continues to have mild tremor. Denies any falls. Works out 3-4 days per week. He  does feel that his tremors can improve more to help him to not feel embarrased.   UPDATE 01/04/12: Couldn't afford meds in end of june and for last 3 weeks of June was taking less meds than rx'd. Tremor worsened. Now has insurance and getting better tremor control, but still present. Still with int drooling. No constipation. No wearing off. No dyskinesias. No constipation.   UPDATE 10/27/11: Doing about the same. Not much benefit with carb/levo. No side effects. Still with tremor (rest and action). No falls. Works out 3x per week. Recent gold medal at [employer]  PRIOR HPI: 69-year old left-handed male with no past medical history, here for evaluation of tremor. Since November 2012, patient has developed progressive tremor in his left hand greater than right hand. He also feels slower with fine finger movements and coordination. His girlfriend is noticed short shuffling steps with walking. Symptoms have been progressive over last few months. Patient had an injury in 2012 with rotator cuff tear. He thinks that since that time his symptoms have been worse. He denies change in smell or taste, vivid dreams, hallucinations, constipation or swallowing difficulty.   REVIEW OF SYSTEMS: Full 14 system review of systems performed and notable only for as per HPI.   ALLERGIES: No Known Allergies  HOME MEDICATIONS: Outpatient Prescriptions Prior to Visit  Medication Sig Dispense Refill  . Cholecalciferol (VITAMIN D PO) Take by mouth daily.    . carbidopa-levodopa (SINEMET IR) 25-100 MG per tablet Take 1.5 tablets by mouth 3 (three) times daily. 540 tablet 4  . clonazePAM (KLONOPIN) 0.5 MG  tablet Take 3 tablets (1.5 mg total) by mouth at bedtime. 90 tablet 5  . pramipexole (MIRAPEX) 1 MG tablet Take 2 tablets (2 mg total) by mouth 2 (two) times daily. 360 tablet 4  . albuterol (PROVENTIL HFA;VENTOLIN HFA) 108 (90 BASE) MCG/ACT inhaler Inhale 2 puffs into the lungs every 6 (six) hours as needed for wheezing  or shortness of breath. (Patient not taking: Reported on 08/13/2014) 1 Inhaler 2  . glucose blood (ACCU-CHEK AVIVA) test strip Use as instructed (Patient not taking: Reported on 08/13/2014) 100 each 12  . Lancets Misc. (ACCU-CHEK MULTICLIX LANCET DEV) KIT 1 Device by Does not apply route daily with breakfast. (Patient not taking: Reported on 08/13/2014) 100 each 3   No facility-administered medications prior to visit.    PAST MEDICAL HISTORY: Past Medical History  Diagnosis Date  . Parkinson's disease   . Other and unspecified hyperlipidemia   . Unspecified vitamin D deficiency   . Other and unspecified hyperlipidemia   . 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 Unspecified vitamin D deficiency   . Disorders of bursae and tendons in shoulder region, unspecified     PAST SURGICAL HISTORY: Past Surgical History  Procedure Laterality Date  . Appendectomy    . Rotator cuff repair      right    FAMILY HISTORY: Family History  Problem Relation Age of Onset  . Colon cancer Neg Hx   . Esophageal cancer Neg Hx   . Rectal cancer Neg Hx   . Stomach cancer Neg Hx   . Cancer Mother   . Cancer Father   . Stroke Brother   . Cancer Sister   . Cancer Sister     SOCIAL HISTORY: History   Social History  . Marital Status: Legally Separated    Spouse Name: N/A  . Number of Children: 3  . Years of Education: 12th   Occupational History  .     Social History Main Topics  . Smoking status: Former Research scientist (life sciences)  . Smokeless tobacco: Never Used  . Alcohol Use: No  . Drug Use: No  . Sexual Activity: No   Other Topics Concern  . Not on file   Social History Narrative     PHYSICAL EXAM  Filed Vitals:   08/13/14 1434  BP: 124/70  Pulse: 80  Weight: 253 lb 3.2 oz (114.851 kg)   Body mass index is 34.83 kg/(m^2).  Generalized: Well developed, in no acute distress  Neck: Supple, no carotid bruits  Cardiac: Regular rate rhythm, no murmur   Neurological examination    MENTAL STATUS: awake, alert, language fluent, comprehension intact, naming intact; MASKED FACIES. BORDERLINE MYERSONS.  CRANIAL NERVE: pupils equal and reactive to light, visual fields full to confrontation, extraocular muscles intact, no nystagmus, facial sensation and strength symmetric, uvula midline, shoulder shrug symmetric, tongue midline.  MOTOR: POSTURAL TREMOR IN RUE; NO RIGIDITY. MILD BRADYKINESIA IN LUE. Normal bulk and tone, full strength in the BUE, BLE.  SENSORY: normal and symmetric to light touch.  COORDINATION: finger-nose-finger, fine finger movements normal  REFLEXES: deep tendon reflexes present and symmetric  GAIT/STATION: SLIGHTLY STOOPED POSTURE. DECR ARM SWING. MILD TREMOR IN LUE WITH WALKING. SMOOTH TURING. Narrow based gait.  ASSESSMENT AND PLAN 69 y.o. male with progressive resting tremor, bradykinesia, cogwheel rigidity, postural instability, consistent with parkinson's disease (with favorable response to carb/levo and pramipexole). Also with REM behavior sleep disorder sxs and some benefit with clonazepam.   PLAN:  - MRI lumbar spine  for LBP, left groin pain, BLE weakness/pain with standing; r/o lumbar spinal stenosis  - trial of flexeril for left groin pulling/spasms - continue clonazepam to 1.70m qhs - continue carbidopa/levodopa 25/1089m1.5 tab PO TID  - continue pramipexole 63m1mID   Meds ordered this encounter  Medications  . cyclobenzaprine (FLEXERIL) 10 MG tablet    Sig: Take 1 tablet (10 mg total) by mouth 3 (three) times daily as needed for muscle spasms.    Dispense:  30 tablet    Refill:  0  . carbidopa-levodopa (SINEMET IR) 25-100 MG per tablet    Sig: Take 1.5 tablets by mouth 3 (three) times daily.    Dispense:  540 tablet    Refill:  4  . clonazePAM (KLONOPIN) 0.5 MG tablet    Sig: Take 3 tablets (1.5 mg total) by mouth at bedtime.    Dispense:  90 tablet    Refill:  5  . pramipexole (MIRAPEX) 1 MG tablet    Sig: Take 2 tablets (2 mg  total) by mouth 2 (two) times daily.    Dispense:  360 tablet    Refill:  4   Return in about 6 months (around 02/13/2015).   VIKPenni BombardD 3/85/10/156:16:82 Certified in Neurology, Neurophysiology and Neuroimaging  [facility] [address] [phone]

## 2014-09-02 DIAGNOSIS — G20 Parkinson's disease: Secondary | ICD-10-CM | POA: Diagnosis not present

## 2014-09-05 DIAGNOSIS — M7612 Psoas tendinitis, left hip: Secondary | ICD-10-CM | POA: Diagnosis not present

## 2014-09-05 DIAGNOSIS — I10 Essential (primary) hypertension: Secondary | ICD-10-CM | POA: Diagnosis not present

## 2014-09-09 ENCOUNTER — Ambulatory Visit
Admission: RE | Admit: 2014-09-09 | Discharge: 2014-09-09 | Disposition: A | Discharge status: Home / Self Care | Payer: Medicare Other | Source: Ambulatory Visit | Attending: Diagnostic Neuroimaging | Admitting: Diagnostic Neuroimaging

## 2014-09-09 DIAGNOSIS — R29898 Other symptoms and signs involving the musculoskeletal system: Secondary | ICD-10-CM

## 2014-09-09 DIAGNOSIS — G4752 REM sleep behavior disorder: Secondary | ICD-10-CM

## 2014-09-09 DIAGNOSIS — R1030 Lower abdominal pain, unspecified: Secondary | ICD-10-CM | POA: Diagnosis not present

## 2014-09-09 DIAGNOSIS — R1032 Left lower quadrant pain: Secondary | ICD-10-CM

## 2014-09-09 DIAGNOSIS — G20 Parkinson's disease: Secondary | ICD-10-CM | POA: Diagnosis not present

## 2014-09-16 ENCOUNTER — Telehealth: Payer: Self-pay | Admitting: Diagnostic Neuroimaging

## 2014-09-16 DIAGNOSIS — M48062 Spinal stenosis, lumbar region with neurogenic claudication: Secondary | ICD-10-CM

## 2014-09-16 NOTE — Telephone Encounter (Signed)
Patient requesting MRI results.  Please call and advise. °

## 2014-09-16 NOTE — Telephone Encounter (Signed)
-----   Message from Bradly Chris, RN sent at 09/16/2014 11:36 AM EDT ----- He wants his MRI results. They were abnormal but looked like some bulging discs or age changes. What would you like me to call him and tell him? Aldona Bar

## 2014-09-16 NOTE — Telephone Encounter (Signed)
I called pt with MRI results. Advised conservative mgmt for now with PT. May consider pain mgmt / epidural injections or spine surgeon eval in future if symptoms are persistent or worsen.   Penni Bombard, MD 7/74/1287, 86:76 PM Certified in Neurology, Neurophysiology and Neuroimaging  [facility] [address], [place], [place] [address] [phone]

## 2014-09-18 DIAGNOSIS — I70219 Atherosclerosis of native arteries of extremities with intermittent claudication, unspecified extremity: Secondary | ICD-10-CM | POA: Diagnosis not present

## 2014-09-18 DIAGNOSIS — Z0000 Encounter for general adult medical examination without abnormal findings: Secondary | ICD-10-CM | POA: Diagnosis not present

## 2014-09-18 DIAGNOSIS — Z79899 Other long term (current) drug therapy: Secondary | ICD-10-CM | POA: Diagnosis not present

## 2014-09-18 DIAGNOSIS — Z131 Encounter for screening for diabetes mellitus: Secondary | ICD-10-CM | POA: Diagnosis not present

## 2014-09-18 DIAGNOSIS — Z6835 Body mass index (BMI) 35.0-35.9, adult: Secondary | ICD-10-CM | POA: Diagnosis not present

## 2014-09-18 DIAGNOSIS — I10 Essential (primary) hypertension: Secondary | ICD-10-CM | POA: Diagnosis not present

## 2014-09-18 DIAGNOSIS — R42 Dizziness and giddiness: Secondary | ICD-10-CM | POA: Diagnosis not present

## 2014-09-18 DIAGNOSIS — E784 Other hyperlipidemia: Secondary | ICD-10-CM | POA: Diagnosis not present

## 2014-09-18 DIAGNOSIS — R002 Palpitations: Secondary | ICD-10-CM | POA: Diagnosis not present

## 2014-09-18 DIAGNOSIS — R5383 Other fatigue: Secondary | ICD-10-CM | POA: Diagnosis not present

## 2014-09-18 DIAGNOSIS — E780 Pure hypercholesterolemia: Secondary | ICD-10-CM | POA: Diagnosis not present

## 2014-09-18 DIAGNOSIS — H9113 Presbycusis, bilateral: Secondary | ICD-10-CM | POA: Diagnosis not present

## 2014-09-18 DIAGNOSIS — Z23 Encounter for immunization: Secondary | ICD-10-CM | POA: Diagnosis not present

## 2014-09-18 DIAGNOSIS — Z1389 Encounter for screening for other disorder: Secondary | ICD-10-CM | POA: Diagnosis not present

## 2014-09-18 DIAGNOSIS — Z1211 Encounter for screening for malignant neoplasm of colon: Secondary | ICD-10-CM | POA: Diagnosis not present

## 2014-09-18 DIAGNOSIS — Z125 Encounter for screening for malignant neoplasm of prostate: Secondary | ICD-10-CM | POA: Diagnosis not present

## 2014-09-18 DIAGNOSIS — R1032 Left lower quadrant pain: Secondary | ICD-10-CM | POA: Diagnosis not present

## 2015-01-10 ENCOUNTER — Ambulatory Visit: Payer: Medicare Other | Attending: Diagnostic Neuroimaging

## 2015-01-10 DIAGNOSIS — M48062 Spinal stenosis, lumbar region with neurogenic claudication: Secondary | ICD-10-CM

## 2015-01-10 DIAGNOSIS — R269 Unspecified abnormalities of gait and mobility: Secondary | ICD-10-CM | POA: Insufficient documentation

## 2015-01-10 DIAGNOSIS — R2681 Unsteadiness on feet: Secondary | ICD-10-CM | POA: Diagnosis present

## 2015-01-10 DIAGNOSIS — M4806 Spinal stenosis, lumbar region: Secondary | ICD-10-CM | POA: Insufficient documentation

## 2015-01-10 NOTE — Therapy (Signed)
[facility] [address] [place], [place], [address] Phone: [phone]   Fax:  [phone]  Physical Therapy Evaluation  Patient Details  Name: Theodore King MRN: [id number] Date of Birth: 1946/02/16 Referring Provider:  Penni Bombard, MD  Encounter Date: 01/10/2015      PT End of Session - 01/10/15 1046    Visit Number 1   Number of Visits 9   Date for PT Re-Evaluation 02/09/15   Authorization Type G-code every 10th visit.   PT Start Time [phone]   PT Stop Time 0932   PT Time Calculation (min) 46 min   Equipment Utilized During Treatment Gait belt   Activity Tolerance Patient tolerated treatment well   Behavior During Therapy WFL for tasks assessed/performed      Past Medical History  Diagnosis Date  . Parkinson's disease   . Other and unspecified hyperlipidemia   . Unspecified vitamin D deficiency   . Other and unspecified hyperlipidemia   . 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 Unspecified vitamin D deficiency   . Disorders of bursae and tendons in shoulder region, unspecified     Past Surgical History  Procedure Laterality Date  . Appendectomy    . Rotator cuff repair      right    There were no vitals filed for this visit.  Visit Diagnosis:  Abnormality of gait - Plan: PT plan of care cert/re-cert  Spinal stenosis, lumbar region, with neurogenic claudication - Plan: PT plan of care cert/re-cert  Unsteadiness - Plan: PT plan of care cert/re-cert      Subjective Assessment - 01/10/15 0853    Subjective Pt was diagnosed with parkinson's disease 1.5 years ago. Pt reported his balance has become worse during ambulation and when standing he feels as though he will fall forward. Pt reports he stumbles when walking and is worried he's going to fall. Pt stumbles 1-2 times a day. Pt had imaging which found spinal stenosis, but pt reports his primary concern is his balance. Pt denied  back pain today but reports his back hurts "a little" after playing basketball. Pt reported Dr. Mariea Clonts prescribed an inhaler (he gets SOB but states he does not have lung disease), but he could not afford inhaler.   Pertinent History Parkinson's disease, history of R rotator cuff  surgery, assess SaO2 as pt reports intermittent SOB   Patient Stated Goals Pt would like to walk without fear of falling   Currently in Pain? Yes   Pain Score 4    Pain Location Groin   Pain Orientation Left   Pain Descriptors / Indicators Sore   Pain Type Chronic pain   Pain Onset More than a month ago   Pain Frequency Intermittent   Aggravating Factors  stretching   Pain Relieving Factors sit and rest            [facility] PT Assessment - 01/10/15 0902    Assessment   Medical Diagnosis Spinal stenosis   Onset Date/Surgical Date 07/08/14   Prior Therapy after R rotator cuff surgery   Precautions   Precautions Fall   Restrictions   Weight Bearing Restrictions No   Balance Screen   Has the patient fallen in the past 6 months No  but stumbles 1-2 times/day   Has the patient had a decrease in activity level because of a fear of falling?  No   Is the patient reluctant to leave their home because of a fear  of falling?  No   Home Ecologist residence   Living Arrangements Children  dtr   Available Help at Discharge Family   Type of [place] to enter   Entrance Stairs-Number of Steps 4-5   Entrance Stairs-Rails Right   [place] One level   [place] Crutches;Grab bars - tub/shower   Prior Function   Level of [place] Retired   Leisure swimming, playing basketball   Cognition   Overall Cognitive Status --   Memory Impaired   Memory Impairment Decreased short term memory   Sensation   Light Touch Appears Intact   Additional Comments No N/T    Coordination   Gross Motor Movements are Fluid and Coordinated Yes    Fine Motor Movements are Fluid and Coordinated Yes   Posture/Postural Control   Posture/Postural Control Postural limitations   Postural Limitations Forward head   Tone   Assessment Location --  no tone noted   ROM / Strength   AROM / PROM / Strength AROM;Strength   AROM   Overall AROM  Within functional limits for tasks performed   Strength   Overall Strength Within functional limits for tasks performed   Overall Strength Comments B LE WFL, but need to formally assess B hip abd next session due to occasional trendelenbury gait.   Transfers   Transfers Sit to Stand;Stand to Sit   Sit to Stand 5: Supervision;Without upper extremity assist;From chair/3-in-1   Stand to Sit 5: Supervision;Without upper extremity assist;To chair/3-in-1   Ambulation/Gait   Ambulation/Gait Yes   Ambulation/Gait Assistance 5: Supervision   Ambulation/Gait Assistance Details Pt noted to amb. with wide BOS but did experience narrow BOS when traversing turns, and had to stop to maintain balance around turn. Occasional trendelenburg gait, will formally assess B hip abd strength next session.   Ambulation Distance (Feet) 100 Feet   Assistive device None   Gait Pattern Step-through pattern;Decreased dorsiflexion - right;Decreased dorsiflexion - left;Wide base of support;Decreased trunk rotation  but narrow BOS when traversing turns   Ambulation Surface Level;Indoor   Gait velocity 3.73ft/sec.   Standardized Balance Assessment   Standardized Balance Assessment Berg Balance Test;Timed Up and Go Test   Berg Balance Test   Sit to Stand Able to stand without using hands and stabilize independently   Standing Unsupported Able to stand safely 2 minutes   Sitting with Back Unsupported but Feet Supported on Floor or Stool Able to sit safely and securely 2 minutes   Stand to Sit Sits safely with minimal use of hands   Transfers Able to transfer safely, minor use of hands   Standing Unsupported with Eyes Closed Able to  stand 10 seconds with supervision   Standing Ubsupported with Feet Together Able to place feet together independently and stand for 1 minute with supervision   From Standing, Reach Forward with Outstretched Arm Can reach confidently >25 cm (10")   From Standing Position, Pick up Object from Floor Able to pick up shoe safely and easily   From Standing Position, Turn to Look Behind Over each Shoulder Looks behind from both sides and weight shifts well   Turn 360 Degrees Able to turn 360 degrees safely one side only in 4 seconds or less   Standing Unsupported, Alternately Place Feet on Step/Stool Able to stand independently and safely and complete 8 steps in 20 seconds   Standing Unsupported, One Foot in [employer]  Able to plae foot ahead of the other independently and hold 30 seconds   Standing on One Leg Tries to lift leg/unable to hold 3 seconds but remains standing independently   Total Score 49   Timed Up and Go Test   TUG Normal TUG;Cognitive TUG   Normal TUG (seconds) 10.29   Cognitive TUG (seconds) 16.24                           PT Education - 01-20-2015 1045    Education provided Yes   Education Details Results of outcome measures. PT duration/frequency.   Person(s) Educated Patient   Methods Explanation   Comprehension Verbalized understanding          PT Short Term Goals - 20-Jan-2015 1054    PT SHORT TERM GOAL #1   Title Same as LTGs.           PT Long Term Goals - 2015/01/20 1054    PT LONG TERM GOAL #1   Title Pt will be independent in HEP to improve balance and endurance. Target date: 02/07/15   Status New   PT LONG TERM GOAL #2   Title Pt will improve BERG to >/=53/56 to decrease falls risk. Target date: 02/07/15.   Status New   PT LONG TERM GOAL #3   Title Have pt finish FOTO and write goal. Target date: 02/07/15.   Status New   PT LONG TERM GOAL #4   Title Perform FGA and write goal if appropriate. Target date: 02/07/15.   Status New   PT LONG TERM  GOAL #5   Title Pt will perform cognitive TUG in <15 seconds to decr. falls risk. Target date: 02/07/15.   Status New   Additional Long Term Goals   Additional Long Term Goals Yes   PT LONG TERM GOAL #6   Title Pt will ambulate 600' over even/uneven terrain, independently, to improve functional mobility. Target date: 02/07/15.   Status New               Plan - Jan 20, 2015 1046    Clinical Impression Statement Pt is a pleasant 69y/o male presenting to OPPT neuro with spinal stenosis and history of Parkinson's Disease. Pt presented with decreased endurance, impaired balance and risk for falls based on BERG and cognitive TUG results. PT will assess B hip abductors next session due to occasional Trendelenberg gait, which could indicate weak hip abductors. Pt also experiences pain when stretching L LE adductor muscle groups.    Pt will benefit from skilled therapeutic intervention in order to improve on the following deficits Abnormal gait;Decreased endurance;Pain;Decreased knowledge of use of DME;Decreased balance;Decreased mobility;Decreased strength;Decreased cognition;Impaired flexibility   Rehab Potential Good   PT Frequency 2x / week   PT Duration 4 weeks   PT Treatment/Interventions ADLs/Self Care Home Management;Balance training;Neuromuscular re-education;DME Instruction;Gait training;Stair training;Functional mobility training;Therapeutic activities;Therapeutic exercise;Manual techniques;Vestibular;Patient/family education;Orthotic Fit/Training   PT Next Visit Plan Initiate PWR HEP, perform FGA and write goal if appropriate.   Consulted and Agree with Plan of Care Patient          G-Codes - 01/20/15 1226    Functional Assessment Tool Used BERG: 49/56; cognitive TUG: 16.24sec. no AD; normal TUG: 10.29sec. no AD; gait speed: 3.63ft/sec. no AD   Functional Limitation Mobility: Walking and moving around   Mobility: Walking and Moving Around Current Status [phone]) At least 20 percent but  less than 40 percent impaired, limited or restricted  Mobility: Walking and Moving Around Goal Status [id number]) At least 1 percent but less than 20 percent impaired, limited or restricted       Problem List Patient Active Problem List   Diagnosis Date Noted  . Abnormal dreams 09/06/2013  . 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 Hyperglycemia 05/18/2013  . Paralysis agitans 05/18/2013  . Unspecified vitamin D deficiency 05/18/2013  . Hyperlipidemia LDL goal < 100 05/18/2013  . Parkinson's disease 10/26/2012    Yizel Canby L 01/10/2015, 12:31 PM  [facility] [address] [place], [place], [address] Phone: [phone]   Fax:  [phone]    Geoffry Paradise, PT,DPT 01/10/2015 12:31 PM Phone: [phone] Fax: [phone]

## 2015-01-16 ENCOUNTER — Ambulatory Visit: Payer: Medicare Other

## 2015-01-16 DIAGNOSIS — R269 Unspecified abnormalities of gait and mobility: Secondary | ICD-10-CM

## 2015-01-16 DIAGNOSIS — R2681 Unsteadiness on feet: Secondary | ICD-10-CM

## 2015-01-16 NOTE — Therapy (Signed)
[facility] [address] [place], [place], [address] Phone: [phone]   Fax:  [phone]  Physical Therapy Treatment  Patient Details  Name: Theodore King MRN: [id number] Date of Birth: Aug 20, 1945 Referring Provider:  [name], MD  Encounter Date: 01/16/2015      PT End of Session - 01/16/15 1133    Visit Number 2   Number of Visits 9   Date for PT Re-Evaluation 02/09/15   Authorization Type G-code every 10th visit.   PT Start Time [phone]   PT Stop Time 0927   PT Time Calculation (min) 41 min   Equipment Utilized During Treatment Gait belt   Activity Tolerance Patient tolerated treatment well   Behavior During Therapy WFL for tasks assessed/performed      Past Medical History  Diagnosis Date  . Parkinson's disease   . Other and unspecified hyperlipidemia   . Unspecified vitamin D deficiency   . Other and unspecified hyperlipidemia   . 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 Unspecified vitamin D deficiency   . Disorders of bursae and tendons in shoulder region, unspecified     Past Surgical History  Procedure Laterality Date  . Appendectomy    . Rotator cuff repair      right    There were no vitals filed for this visit.  Visit Diagnosis:  Abnormality of gait  Unsteadiness      Subjective Assessment - 01/16/15 0848    Subjective Pt denied falls or changes since last visit.    Pertinent History Parkinson's disease, history of R rotator cuff  surgery, assess SaO2 as pt reports intermittent SOB   Patient Stated Goals Pt would like to walk without fear of falling   Currently in Pain? No/denies            [facility] PT Assessment - 01/16/15 0850    Functional Gait  Assessment   Gait assessed  Yes   Gait Level Surface Walks 20 ft in less than 7 sec but greater than 5.5 sec, uses assistive device, slower speed, mild gait deviations, or deviates 6-10 in outside of the 12 in  walkway width.  6.1 seconds   Change in Gait Speed Able to change speed, demonstrates mild gait deviations, deviates 6-10 in outside of the 12 in walkway width, or no gait deviations, unable to achieve a major change in velocity, or uses a change in velocity, or uses an assistive device.   Gait with Horizontal Head Turns Performs head turns smoothly with slight change in gait velocity (eg, minor disruption to smooth gait path), deviates 6-10 in outside 12 in walkway width, or uses an assistive device.   Gait with Vertical Head Turns Performs head turns with no change in gait. Deviates no more than 6 in outside 12 in walkway width.   Gait and Pivot Turn Pivot turns safely within 3 sec and stops quickly with no loss of balance.   Step Over Obstacle Is able to step over 2 stacked shoe boxes taped together (9 in total height) without changing gait speed. No evidence of imbalance.   Gait with Narrow Base of Support Ambulates 4-7 steps.  5 without LOB   Gait with Eyes Closed Walks 20 ft, no assistive devices, good speed, no evidence of imbalance, normal gait pattern, deviates no more than 6 in outside 12 in walkway width. Ambulates 20 ft in less than 7 sec.   Ambulating Backwards Walks 20 ft, uses assistive device,  slower speed, mild gait deviations, deviates 6-10 in outside 12 in walkway width.  13.8 seconds   Steps Alternating feet, no rail.   Total Score 24         Neuro re-ed: PWR! HEP in standing (4 exercises total: posture, weight shifting, trunk rotation and transition): x25 reps/exercise. Pt required cues and demonstration for technique. Pt performed 5 reps, took a seated rest break and then performed 20 reps. Standing at counter with UE support: L adductor stretch 3x30 second hold. Cues for technique and to maintain upright posture.                    PT Education - 01/16/15 1133    Education provided Yes   Education Details L adductor stretch. PWR! HEP   Person(s)  Educated Patient   Methods Explanation;Demonstration;Tactile cues;Verbal cues;Handout   Comprehension Verbalized understanding;Returned demonstration;Need further instruction          PT Short Term Goals - 01/10/15 1054    PT SHORT TERM GOAL #1   Title Same as LTGs.           PT Long Term Goals - 01/16/15 1135    PT LONG TERM GOAL #1   Title Pt will be independent in HEP to improve balance and endurance. Target date: 02/07/15   Status On-going   PT LONG TERM GOAL #2   Title Pt will improve BERG to >/=53/56 to decrease falls risk. Target date: 02/07/15.   Status On-going   PT LONG TERM GOAL #3   Title Have pt finish FOTO and write goal. Target date: 02/07/15.   Status On-going   PT LONG TERM GOAL #4   Title Perform FGA and write goal if appropriate. Target date: 02/07/15.   Status Achieved   PT LONG TERM GOAL #5   Title Pt will perform cognitive TUG in <15 seconds to decr. falls risk. Target date: 02/07/15.   Status On-going   Additional Long Term Goals   Additional Long Term Goals Yes   PT LONG TERM GOAL #6   Title Pt will ambulate 600' over even/uneven terrain, independently, to improve functional mobility. Target date: 02/07/15.   Status On-going   PT LONG TERM GOAL #7   Title Pt will improve FGA score to >/=28/30 to decrease falls risk. Target date: 02/07/15.   Status New               Plan - 01/16/15 1133    Clinical Impression Statement Pt's FGA score of 24/30, indicates pt is at risk for falls. Pt was able to return demonstration of PWR! HEP, but required VC's and tactile cues during last 4 reps of each exercise. Pt would continue to benefit from skilled PT to improve safety during fucntional mobility. Continue with POC.   Pt will benefit from skilled therapeutic intervention in order to improve on the following deficits Abnormal gait;Decreased endurance;Pain;Decreased knowledge of use of DME;Decreased balance;Decreased mobility;Decreased strength;Decreased  cognition;Impaired flexibility   Rehab Potential Good   PT Frequency 2x / week   PT Duration 4 weeks   PT Treatment/Interventions ADLs/Self Care Home Management;Balance training;Neuromuscular re-education;DME Instruction;Gait training;Stair training;Functional mobility training;Therapeutic activities;Therapeutic exercise;Manual techniques;Vestibular;Patient/family education;Orthotic Fit/Training   PT Next Visit Plan Review PWR HEP. Dynamic gait and balance over uneven/compliant surfaces. Have pt finish FOTO survey.   PT Home Exercise Plan PWR! and adductor stretch   Consulted and Agree with Plan of Care Patient        Problem List Patient Active  Problem List   Diagnosis Date Noted  . Abnormal dreams 09/06/2013  . 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 Hyperglycemia 05/18/2013  . Paralysis agitans 05/18/2013  . Unspecified vitamin D deficiency 05/18/2013  . Hyperlipidemia LDL goal < 100 05/18/2013  . Parkinson's disease 10/26/2012    Miller,Jennifer L 01/16/2015, 11:39 AM  [facility] [address] [place], [place], [address] Phone: [phone]   Fax:  [phone]     Geoffry Paradise, PT,DPT 01/16/2015 11:39 AM Phone: [phone] Fax: [phone]

## 2015-01-16 NOTE — Patient Instructions (Signed)
Parkinson's Basic 4 PWR! Moves in standing-handout provided. Each exercise performed 20 times.  **Standing hip adductor stretch: Standing at the counter, with hands on counter, bring Left leg out to the side (while bending right knee) and hold for 20-30 seconds. Perform 2-3 reps, 3 times a day.

## 2015-01-17 ENCOUNTER — Ambulatory Visit: Payer: Medicare Other

## 2015-01-17 DIAGNOSIS — R2681 Unsteadiness on feet: Secondary | ICD-10-CM

## 2015-01-17 DIAGNOSIS — R269 Unspecified abnormalities of gait and mobility: Secondary | ICD-10-CM | POA: Diagnosis not present

## 2015-01-17 NOTE — Therapy (Addendum)
[facility] [address] [place], [place], [address] Phone: [phone]   Fax:  [phone]  Physical Therapy Treatment  Patient Details  Name: Theodore King MRN: [id number] Date of Birth: [birth year] Referring Provider:  [name], MD  Encounter Date: 01/17/2015      PT End of Session - 01/17/15 1656    Visit Number 3   Number of Visits 9   Date for PT Re-Evaluation 02/09/15   Authorization Type G-code every 10th visit.   PT Start Time 1016   PT Stop Time 1057   PT Time Calculation (min) 41 min   Equipment Utilized During Treatment Gait belt   Activity Tolerance Patient tolerated treatment well   Behavior During Therapy WFL for tasks assessed/performed      Past Medical History  Diagnosis Date  . Parkinson's disease   . Other and unspecified hyperlipidemia   . Unspecified vitamin D deficiency   . Other and unspecified hyperlipidemia   . 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 Unspecified vitamin D deficiency   . Disorders of bursae and tendons in shoulder region, unspecified     Past Surgical History  Procedure Laterality Date  . Appendectomy    . Rotator cuff repair      right    There were no vitals filed for this visit.  Visit Diagnosis:  Abnormality of gait  Unsteadiness      Subjective Assessment - 01/17/15 1018    Subjective Pt denied falls or changes since last visit.   Pertinent History Parkinson's disease, history of R rotator cuff  surgery, assess SaO2 as pt reports intermittent SOB   Patient Stated Goals Pt would like to walk without fear of falling   Currently in Pain? No/denies                         [facility] Adult PT Treatment/Exercise - 01/17/15 1652    Ambulation/Gait   Ambulation/Gait Yes   Ambulation/Gait Assistance 5: Supervision   Ambulation/Gait Assistance Details Pt ambulated over even/uneven terrain outdoors/indoors. Supervision  required during head turns outdoors due to incr. postural sway but no overt LOB. Cues for weight shifting when traversing incline/decline.   Ambulation Distance (Feet) 400 Feet   Assistive device None   Gait Pattern Step-through pattern;Decreased dorsiflexion - right;Decreased dorsiflexion - left;Wide base of support;Decreased trunk rotation   Ambulation Surface Level;Unlevel;Indoor;Outdoor;Paved;Gravel;Grass   High Level Balance   High Level Balance Activities Negotitating around obstacles;Negotiating over obstacles;Figure 8 turns   High Level Balance Comments Min guard-supervision. Pt negotiated hurdles and cones over mulch and grass, cues to improve step height over hurdles. Pt also performed cone taps in grass and over mulch: single and triple taps, knock over/right cones. Cues to improve lat. weight shifting. Min A during 2 LOB episodes.       FOTO: PT had to assist pt in completing FOTO survey, per pt request. Time not billed to pt.(11 minutes)         PT Education - 01/17/15 1656    Education provided Yes   Education Details Reviewed PWR! HEP 5-10 reps/activity with cues for technique.   Person(s) Educated Patient   Methods Explanation;Demonstration;Verbal cues;Handout   Comprehension Returned demonstration;Verbalized understanding          PT Short Term Goals - 01/10/15 1054    PT SHORT TERM GOAL #1   Title Same as LTGs.  PT Long Term Goals - 01/17/15 1658    PT LONG TERM GOAL #1   Title Pt will be independent in HEP to improve balance and endurance. Target date: 02/07/15   Status On-going   PT LONG TERM GOAL #2   Title Pt will improve BERG to >/=53/56 to decrease falls risk. Target date: 02/07/15.   Status On-going   PT LONG TERM GOAL #3   Title Have pt finish FOTO and write goal. Target date: 02/07/15.   Status On-going   PT LONG TERM GOAL #4   Title Perform FGA and write goal if appropriate. Target date: 02/07/15.   Status Achieved   PT LONG TERM GOAL  #5   Title Pt will perform cognitive TUG in <15 seconds to decr. falls risk. Target date: 02/07/15.   Status On-going   Additional Long Term Goals   Additional Long Term Goals Yes   PT LONG TERM GOAL #6   Title Pt will ambulate 600' over even/uneven terrain, independently, to improve functional mobility. Target date: 02/07/15.   Status On-going   PT LONG TERM GOAL #7   Title Pt will improve FGA score to >/=28/30 to decrease falls risk. Target date: 02/07/15.   Status On-going   PT LONG TERM GOAL #8   Title Pt will improve FOTO physical fear score by 10 points to improve quality of life. Target date: 02/07/15.   Baseline adjusted by FOTO: [place] - 01/17/15 1656    Clinical Impression Statement Pt demonstrated progress as he required less cues during PWR! exercises today. Pt continues to experience increase postural sway when ambulating and performing head turns (especially over uneven terrain). Continue with POC.   Pt will benefit from skilled therapeutic intervention in order to improve on the following deficits Abnormal gait;Decreased endurance;Pain;Decreased knowledge of use of DME;Decreased balance;Decreased mobility;Decreased strength;Decreased cognition;Impaired flexibility   Rehab Potential Good   PT Frequency 2x / week   PT Duration 4 weeks   PT Treatment/Interventions ADLs/Self Care Home Management;Balance training;Neuromuscular re-education;DME Instruction;Gait training;Stair training;Functional mobility training;Therapeutic activities;Therapeutic exercise;Manual techniques;Vestibular;Patient/family education;Orthotic Fit/Training   PT Next Visit Plan Dynamic gait and balance over uneven/compliant surfaces. Add external pertubations as appropriate.   PT Home Exercise Plan PWR! and adductor stretch   Consulted and Agree with Plan of Care Patient        Problem List Patient Active Problem List   Diagnosis Date Noted  . Abnormal dreams 09/06/2013   . 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 Hyperglycemia 05/18/2013  . Paralysis agitans 05/18/2013  . Unspecified vitamin D deficiency 05/18/2013  . Hyperlipidemia LDL goal < 100 05/18/2013  . Parkinson's disease 10/26/2012    Mikalyn Hermida L 01/17/2015, 5:00 PM  [facility] [address] [place], [place], [address] Phone: [phone]   Fax:  [phone]     Geoffry Paradise, PT,DPT 01/17/2015 5:01 PM Phone: [phone] Fax: [phone]

## 2015-01-23 ENCOUNTER — Ambulatory Visit: Payer: Medicare Other

## 2015-01-24 ENCOUNTER — Ambulatory Visit: Payer: Medicare Other | Admitting: Rehabilitative and Restorative Service Providers"

## 2015-01-28 ENCOUNTER — Ambulatory Visit: Payer: Medicare Other | Admitting: Physical Therapy

## 2015-01-28 DIAGNOSIS — R269 Unspecified abnormalities of gait and mobility: Secondary | ICD-10-CM

## 2015-01-28 NOTE — Therapy (Signed)
[facility] [address] [place], [place], [address] Phone: [phone]   Fax:  [phone]  Physical Therapy Treatment  Patient Details  Name: Theodore [place] MRN: [id number] Date of Birth: July 12, 1945 Referring Provider:  [name], MD  Encounter Date: 01/28/2015      PT End of Session - 01/29/15 0746    Visit Number 4   Number of Visits 9   Date for PT Re-Evaluation 02/09/15   Authorization Type G-code every 10th visit.   PT Start Time 0806   PT Stop Time 0845   PT Time Calculation (min) 39 min   Activity Tolerance Patient tolerated treatment well   Behavior During Therapy [facility] for tasks assessed/performed      Past Medical History  Diagnosis Date  . Parkinson's disease   . Other and unspecified hyperlipidemia   . Unspecified vitamin D deficiency   . Other and unspecified hyperlipidemia   . 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 Unspecified vitamin D deficiency   . Disorders of bursae and tendons in shoulder region, unspecified     Past Surgical History  Procedure Laterality Date  . Appendectomy    . Rotator cuff repair      right    There were no vitals filed for this visit.  Visit Diagnosis:  Abnormality of gait      Subjective Assessment - 01/28/15 0809    Subjective Doing exercises several times per day.   Currently in Pain? No/denies                            PWR [facility]) - 01/28/15 9678    PWR! exercises Moves in standing   PWR! Up x 20   PWR! Rock x 20    PWR! Twist x 20   PWR Step x 20  each:  forward, side, back   Comments Cues for technique, cues for intensity and amplitude of movement     PWR! Moves performed in standing for the following:  PWR! Up for posture, PWR! Rock for [employer], [place]! Twist for trunk rotation and PWR! Step for initial stepping.   Dynamic Balance and gait activities:  On ramp: Pt stands on solid surface on  incline/decline-Marching x 20 reps, forward step taps x 10 reps.  Stepping up onto ramp/back onto floor, x 20 reps. PT provides supervision assistance for balance and cues for wide base of support and increased amplitude. Pt performs the following dynamic balance exercises, 30 ft, 4 reps, on solid surface:  -Forward/back walking with exaggerated arm swing and step length -Forward marching with coordinated reciprocal UE movements -Sidestepping with coordinated UE movements   Pt requires cues for high amplitude, deliberate movement patterns  with the above exercises         PT Education - 01/29/15 0746    Education provided Yes   Education Details Intensity, amplitude, deliberate movement patterns with exercises   Person(s) Educated Patient   Methods Explanation;Demonstration   Comprehension Verbalized understanding;Returned demonstration          PT Short Term Goals - 01/10/15 1054    PT SHORT TERM GOAL #1   Title Same as LTGs.           PT Long Term Goals - 01/17/15 1658    PT LONG TERM GOAL #1   Title Pt will be independent in HEP to improve balance and endurance. Target date: 02/07/15   Status  On-going   PT LONG TERM GOAL #2   Title Pt will improve BERG to >/=53/56 to decrease falls risk. Target date: 02/07/15.   Status On-going   PT LONG TERM GOAL #3   Title Have pt finish FOTO and write goal. Target date: 02/07/15.   Status On-going   PT LONG TERM GOAL #4   Title Perform FGA and write goal if appropriate. Target date: 02/07/15.   Status Achieved   PT LONG TERM GOAL #5   Title Pt will perform cognitive TUG in <15 seconds to decr. falls risk. Target date: 02/07/15.   Status On-going   Additional Long Term Goals   Additional Long Term Goals Yes   PT LONG TERM GOAL #6   Title Pt will ambulate 600' over even/uneven terrain, independently, to improve functional mobility. Target date: 02/07/15.   Status On-going   PT LONG TERM GOAL #7   Title Pt will improve FGA score to  >/=28/30 to decrease falls risk. Target date: 02/07/15.   Status On-going   PT LONG TERM GOAL #8   Title Pt will improve FOTO physical fear score by 10 points to improve quality of life. Target date: 02/07/15.   Baseline adjusted by FOTO: [place] - 01/29/15 0746    Clinical Impression Statement Pt very motivated for therapy and seems to enjoy exercises.  Discussed and practiced exercises as means of improved functional mobility, including using PWR! Rock for improved weightshifting first thing in the morning.  Pt responds well to cues for the deliberate aspect of movement patterns, but he does have difficulty with coordination of reciprocal arm movement with dynamic gait and balance activities.   Pt will benefit from skilled therapeutic intervention in order to improve on the following deficits Abnormal gait;Decreased endurance;Pain;Decreased knowledge of use of DME;Decreased balance;Decreased mobility;Decreased strength;Decreased cognition;Impaired flexibility   Rehab Potential Good   PT Frequency 2x / week   PT Duration 4 weeks   PT Treatment/Interventions ADLs/Self Care Home Management;Balance training;Neuromuscular re-education;DME Instruction;Gait training;Stair training;Functional mobility training;Therapeutic activities;Therapeutic exercise;Manual techniques;Vestibular;Patient/family education;Orthotic Fit/Training   PT Next Visit Plan Dynamic gait and balance over uneven/compliant surfaces. Add external pertubations as appropriate.  Add forward/backward stepping activities to HEP   Consulted and Agree with Plan of Care Patient        Problem List Patient Active Problem List   Diagnosis Date Noted  . Abnormal dreams 09/06/2013  . 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 Hyperglycemia 05/18/2013  . Paralysis agitans 05/18/2013  . Unspecified vitamin D deficiency 05/18/2013  . Hyperlipidemia LDL goal < 100 05/18/2013  . Parkinson's disease  10/26/2012    MARRIOTT,[name] W. 01/29/2015, 7:50 AM  Frazier Butt., PT  [facility] [address] [place], [place], [address] Phone: [phone]   Fax:  [phone]

## 2015-01-30 ENCOUNTER — Ambulatory Visit: Payer: Medicare Other | Admitting: Physical Therapy

## 2015-01-30 DIAGNOSIS — R269 Unspecified abnormalities of gait and mobility: Secondary | ICD-10-CM

## 2015-01-30 NOTE — Therapy (Signed)
[facility] [address] [place], [place], [address] Phone: [phone]   Fax:  [phone]  Physical Therapy Treatment  Patient Details  Name: [place] MRN: [id number] Date of Birth: [birth year] Referring Provider:  [name], MD  Encounter Date: 01/30/2015      PT End of Session - 01/31/15 0850    Visit Number 5   Number of Visits 9   Date for PT Re-Evaluation 02/09/15   Authorization Type G-code every 10th visit.   PT Start Time 0805   PT Stop Time 0845   PT Time Calculation (min) 40 min   Activity Tolerance Patient tolerated treatment well   Behavior During Therapy [facility] for tasks assessed/performed      Past Medical History  Diagnosis Date  . Parkinson's disease   . Other and unspecified hyperlipidemia   . Unspecified vitamin D deficiency   . Other and unspecified hyperlipidemia   . 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 Unspecified vitamin D deficiency   . Disorders of bursae and tendons in shoulder region, unspecified     Past Surgical History  Procedure Laterality Date  . Appendectomy    . Rotator cuff repair      right    There were no vitals filed for this visit.  Visit Diagnosis:  Abnormality of gait      Subjective Assessment - 01/30/15 0808    Subjective No changes since last visit   Currently in Pain? No/denies          Neuro Re-education: Dynamic gait and balance activities:   Pt performs the following dynamic balance exercises, 30 ft, 4 reps, on solid surface:  -Forward/back walking with exaggerated arm swing and step length -Forward marching with coordinated reciprocal UE movements -Sidestepping with coordinated UE movements -Cues provided for deliberate movement patterns; pt has initial difficulty sequencing reciprocal arm movements.  Forward step and weightshift, backward step and weightshift (added to HEP)  On ramp: Pt stands on solid  surface on incline/decline-Marching x 10 reps, forward step taps x 10 reps. Standing with feet apart,  head turns x  10 reps, head nods x 10 reps, with eyes open.  PT provides supervision assistance for balance and cues for posture.  Trunk rotation activities in corner, 2 sets of 10 reps, reaching across body to touch wall, then UE diagonal lifts x 10 reps, all for improved trunk rotation in standing and gait.  Sit<>stand from 20 inch mat surface with cues for improved momentum and forward lean as well as upright best posture in standing.  Gait training:  Gait x 400 ft around gym, indoor surfaces with cues for upright posture, large amplitude arm swing and step length.  Attempted gait training with walking poles; however, with multiple attempts, pt is not able to sequence reciprocal pattern of arm movement.                     PT Education - 01/31/15 0849    Education provided Yes   Education Details HEP addition-forward step and weigthshift, back step and weightshift   Person(s) Educated Patient   Methods Explanation;Demonstration;Handout   Comprehension Verbalized understanding          PT Short Term Goals - 01/10/15 1054    PT SHORT TERM GOAL #1   Title Same as LTGs.           PT Long Term Goals - 01/17/15 1658    PT LONG  TERM GOAL #1   Title Pt will be independent in HEP to improve balance and endurance. Target date: 02/07/15   Status On-going   PT LONG TERM GOAL #2   Title Pt will improve BERG to >/=53/56 to decrease falls risk. Target date: 02/07/15.   Status On-going   PT LONG TERM GOAL #3   Title Have pt finish FOTO and write goal. Target date: 02/07/15.   Status On-going   PT LONG TERM GOAL #4   Title Perform FGA and write goal if appropriate. Target date: 02/07/15.   Status Achieved   PT LONG TERM GOAL #5   Title Pt will perform cognitive TUG in <15 seconds to decr. falls risk. Target date: 02/07/15.   Status On-going   Additional Long Term Goals    Additional Long Term Goals Yes   PT LONG TERM GOAL #6   Title Pt will ambulate 600' over even/uneven terrain, independently, to improve functional mobility. Target date: 02/07/15.   Status On-going   PT LONG TERM GOAL #7   Title Pt will improve FGA score to >/=28/30 to decrease falls risk. Target date: 02/07/15.   Status On-going   PT LONG TERM GOAL #8   Title Pt will improve FOTO physical fear score by 10 points to improve quality of life. Target date: 02/07/15.   Baseline adjusted by FOTO: [place] - 01/31/15 0851    Clinical Impression Statement Pt continues to have difficulty with coordination of reciprocal movement patterns between upper and lower extremity with dynamic balance and gait activities.  Pt continues to benefit from skilled PT to address balance, gait, functional strength and mobiltiy activities.   Pt will benefit from skilled therapeutic intervention in order to improve on the following deficits Abnormal gait;Decreased endurance;Pain;Decreased knowledge of use of DME;Decreased balance;Decreased mobility;Decreased strength;Decreased cognition;Impaired flexibility   Rehab Potential Good   PT Frequency 2x / week   PT Duration 4 weeks   PT Treatment/Interventions ADLs/Self Care Home Management;Balance training;Neuromuscular re-education;DME Instruction;Gait training;Stair training;Functional mobility training;Therapeutic activities;Therapeutic exercise;Manual techniques;Vestibular;Patient/family education;Orthotic Fit/Training   PT Next Visit Plan Dynamic gait and balance over uneven/compliant surfaces. Add external pertubations as appropriate.  Review forward/backward stepping activities to HEP   Consulted and Agree with Plan of Care Patient        Problem List Patient Active Problem List   Diagnosis Date Noted  . Abnormal dreams 09/06/2013  . 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 Hyperglycemia 05/18/2013  . Paralysis agitans 05/18/2013   . Unspecified vitamin D deficiency 05/18/2013  . Hyperlipidemia LDL goal < 100 05/18/2013  . Parkinson's disease 10/26/2012    Rigel Filsinger W. 01/31/2015, 8:53 AM  Frazier Butt., PT [facility] [address] [place], [place], [address] Phone: [phone]   Fax:  [phone]

## 2015-01-31 NOTE — Patient Instructions (Signed)
Provided patient with handout for large amplitude step and weightshift in forward direction, then in backward direction -To be performed 1-2 times per day -10 reps each side

## 2015-02-04 ENCOUNTER — Ambulatory Visit: Payer: Medicare Other

## 2015-02-04 DIAGNOSIS — R2681 Unsteadiness on feet: Secondary | ICD-10-CM

## 2015-02-04 DIAGNOSIS — R269 Unspecified abnormalities of gait and mobility: Secondary | ICD-10-CM | POA: Diagnosis not present

## 2015-02-04 DIAGNOSIS — M48062 Spinal stenosis, lumbar region with neurogenic claudication: Secondary | ICD-10-CM

## 2015-02-04 NOTE — Patient Instructions (Signed)
Pt performed large amplitude step and weightshift in forward direction, then in backward direction -To be performed 1-2 times per day -10 reps each side

## 2015-02-04 NOTE — Therapy (Signed)
[facility] [address] [place], [place], [address] Phone: [phone]   Fax:  [phone]  Physical Therapy Treatment  Patient Details  Name: [place] MRN: [id number] Date of Birth: 12-May-1946 Referring Provider:  [name], MD  Encounter Date: 02/04/2015      PT End of Session - 02/04/15 0853    Visit Number 6   Number of Visits 9   Date for PT Re-Evaluation 02/09/15   Authorization Type G-code every 10th visit.   PT Start Time 0803   PT Stop Time 0846   PT Time Calculation (min) 43 min   Equipment Utilized During Treatment Gait belt   Activity Tolerance Patient tolerated treatment well   Behavior During Therapy WFL for tasks assessed/performed      Past Medical History  Diagnosis Date  . Parkinson's disease   . Other and unspecified hyperlipidemia   . Unspecified vitamin D deficiency   . Other and unspecified hyperlipidemia   . 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 Unspecified vitamin D deficiency   . Disorders of bursae and tendons in shoulder region, unspecified     Past Surgical History  Procedure Laterality Date  . Appendectomy    . Rotator cuff repair      right    There were no vitals filed for this visit.  Visit Diagnosis:  Abnormality of gait  Unsteadiness  Spinal stenosis, lumbar region, with neurogenic claudication      Subjective Assessment - 02/04/15 0807    Subjective Pt denied falls or changes since last visit. Pt reported he performed exercises about 2-3 times this weekend.   Pertinent History Parkinson's disease, history of R rotator cuff  surgery, assess SaO2 as pt reports intermittent SOB   Patient Stated Goals Pt would like to walk without fear of falling   Currently in Pain? No/denies                         [facility] Adult PT Treatment/Exercise - 02/04/15 0834    Ambulation/Gait   Ambulation/Gait Yes   Ambulation/Gait  Assistance 5: Supervision   Ambulation/Gait Assistance Details Pt ambulated with pertubation belt placed around torso, with PT providing resistance and intermittent pertubations. VC's and demonstration for improved reciprocal arm swing, erect posture, and stride length.   Ambulation Distance (Feet) 345 Feet   Assistive device None   Gait Pattern Step-through pattern;Decreased dorsiflexion - right;Decreased dorsiflexion - left;Wide base of support;Decreased trunk rotation;Decreased arm swing - right;Decreased arm swing - left   Ambulation Surface Level;Indoor   Standardized Balance Assessment   Standardized Balance Assessment Berg Balance Test   Berg Balance Test   Sit to Stand Able to stand without using hands and stabilize independently   Standing Unsupported Able to stand safely 2 minutes   Sitting with Back Unsupported but Feet Supported on Floor or Stool Able to sit safely and securely 2 minutes   Stand to Sit Sits safely with minimal use of hands   Transfers Able to transfer safely, minor use of hands   Standing Unsupported with Eyes Closed Able to stand 10 seconds safely   Standing Ubsupported with Feet Together Able to place feet together independently and stand 1 minute safely   From Standing, Reach Forward with Outstretched Arm Can reach confidently >25 cm (10")   From Standing Position, Pick up Object from Floor Able to pick up shoe safely and easily   From Standing Position, Turn  to Look Behind Over each Shoulder Looks behind from both sides and weight shifts well   Turn 360 Degrees Able to turn 360 degrees safely in 4 seconds or less   Standing Unsupported, Alternately Place Feet on Step/Stool Able to stand independently and safely and complete 8 steps in 20 seconds   Standing Unsupported, One Foot in Front Able to plae foot ahead of the other independently and hold 30 seconds   Standing on One Leg Able to lift leg independently and hold equal to or more than 3 seconds   Total  Score 53                PT Education - 02/04/15 2694    Education provided Yes   Education Details PT reviewed stepping HEP. PT provided pt with PWR! exercise class information and registration.   Person(s) Educated Patient   Methods Explanation;Demonstration;Verbal cues;Handout;Tactile cues   Comprehension Verbalized understanding;Returned demonstration          PT Short Term Goals - 01/10/15 1054    PT SHORT TERM GOAL #1   Title Same as LTGs.           PT Long Term Goals - 02/04/15 0855    PT LONG TERM GOAL #1   Title Pt will be independent in HEP to improve balance and endurance. Target date: 02/07/15   Status On-going   PT LONG TERM GOAL #2   Title Pt will improve BERG to >/=53/56 to decrease falls risk. Target date: 02/07/15.   Status Achieved   PT LONG TERM GOAL #3   Title Have pt finish FOTO and write goal. Target date: 02/07/15.   Status On-going   PT LONG TERM GOAL #4   Title Perform FGA and write goal if appropriate. Target date: 02/07/15.   Status Achieved   PT LONG TERM GOAL #5   Title Pt will perform cognitive TUG in <15 seconds to decr. falls risk. Target date: 02/07/15.   Status On-going   PT LONG TERM GOAL #6   Title Pt will ambulate 600' over even/uneven terrain, independently, to improve functional mobility. Target date: 02/07/15.   Status On-going   PT LONG TERM GOAL #7   Title Pt will improve FGA score to >/=28/30 to decrease falls risk. Target date: 02/07/15.   Status On-going   PT LONG TERM GOAL #8   Title Pt will improve FOTO physical fear score by 10 points to improve quality of life. Target date: 02/07/15.   Baseline adjusted by FOTO: 57   Status On-going               Plan - 02/04/15 0853    Clinical Impression Statement Pt progressing towards goals, as he met LTG 2. Pt is now at low falls risk according to improved BERG score of 53/56. Pt continues to experience difficulty with reciprocal arm swing during gait. Pt would continue to  benefit from skilled PT to improve safety during functional mobility.   Pt will benefit from skilled therapeutic intervention in order to improve on the following deficits Abnormal gait;Decreased endurance;Pain;Decreased knowledge of use of DME;Decreased balance;Decreased mobility;Decreased strength;Decreased cognition;Impaired flexibility   Rehab Potential Good   PT Frequency 2x / week   PT Duration 4 weeks   PT Treatment/Interventions ADLs/Self Care Home Management;Balance training;Neuromuscular re-education;DME Instruction;Gait training;Stair training;Functional mobility training;Therapeutic activities;Therapeutic exercise;Manual techniques;Vestibular;Patient/family education;Orthotic Fit/Training   PT Next Visit Plan G-CODE. Finish assessing LTGs and d/c pt.   PT Home Exercise Plan PWR! and adductor  stretch   Consulted and Agree with Plan of Care Patient        Problem List Patient Active Problem List   Diagnosis Date Noted  . Abnormal dreams 09/06/2013  . 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 Hyperglycemia 05/18/2013  . Paralysis agitans 05/18/2013  . Unspecified vitamin D deficiency 05/18/2013  . Hyperlipidemia LDL goal < 100 05/18/2013  . Parkinson's disease 10/26/2012    Sahil Milner L 02/04/2015, 8:59 AM  [facility] [address] [place], [place], [address] Phone: [phone]   Fax:  [phone]     Geoffry Paradise, PT,DPT 02/04/2015 8:59 AM Phone: [phone] Fax: [phone]

## 2015-02-06 ENCOUNTER — Ambulatory Visit: Payer: Medicare Other | Attending: Diagnostic Neuroimaging

## 2015-02-06 DIAGNOSIS — R269 Unspecified abnormalities of gait and mobility: Secondary | ICD-10-CM | POA: Diagnosis not present

## 2015-02-06 DIAGNOSIS — M48062 Spinal stenosis, lumbar region with neurogenic claudication: Secondary | ICD-10-CM

## 2015-02-06 DIAGNOSIS — R2681 Unsteadiness on feet: Secondary | ICD-10-CM

## 2015-02-06 DIAGNOSIS — M4806 Spinal stenosis, lumbar region: Secondary | ICD-10-CM | POA: Insufficient documentation

## 2015-02-06 NOTE — Therapy (Signed)
[facility] [address] [place], [place], [address] Phone: [phone]   Fax:  [phone]  Physical Therapy Treatment  Patient Details  Name: Theodore King MRN: [id number] Date of Birth: [birth year] Referring Provider:  [name], MD  Encounter Date: 02/06/2015      PT End of Session - 02/06/15 0833    Visit Number 7   Number of Visits 9   Date for PT Re-Evaluation 02/09/15   Authorization Type G-code every 10th visit.   PT Start Time 0802   PT Stop Time 0829   PT Time Calculation (min) 27 min   Activity Tolerance Patient tolerated treatment well   Behavior During Therapy [facility] for tasks assessed/performed      Past Medical History  Diagnosis Date  . Parkinson's disease   . Other and unspecified hyperlipidemia   . Unspecified vitamin D deficiency   . Other and unspecified hyperlipidemia   . 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 Unspecified vitamin D deficiency   . Disorders of bursae and tendons in shoulder region, unspecified     Past Surgical History  Procedure Laterality Date  . Appendectomy    . Rotator cuff repair      right    There were no vitals filed for this visit.  Visit Diagnosis:  Abnormality of gait  Unsteadiness  Spinal stenosis, lumbar region, with neurogenic claudication      Subjective Assessment - 02/06/15 0805    Subjective Pt denied falls or changes. Pt reported he can't participate in the Parkinson's class at this time but would like to participate in November.    Pertinent History Parkinson's disease, history of R rotator cuff  surgery, assess SaO2 as pt reports intermittent SOB   Patient Stated Goals Pt would like to walk without fear of falling   Currently in Pain? No/denies            [facility] PT Assessment - 02/06/15 0809    Functional Gait  Assessment   Gait assessed  Yes   Gait Level Surface Walks 20 ft in less than 5.5 sec, no assistive  devices, good speed, no evidence for imbalance, normal gait pattern, deviates no more than 6 in outside of the 12 in walkway width.  5.2 seconds   Change in Gait Speed Able to smoothly change walking speed without loss of balance or gait deviation. Deviate no more than 6 in outside of the 12 in walkway width.   Gait with Horizontal Head Turns Performs head turns smoothly with no change in gait. Deviates no more than 6 in outside 12 in walkway width   Gait with Vertical Head Turns Performs head turns with no change in gait. Deviates no more than 6 in outside 12 in walkway width.   Gait and Pivot Turn Pivot turns safely within 3 sec and stops quickly with no loss of balance.   Step Over Obstacle Is able to step over 2 stacked shoe boxes taped together (9 in total height) without changing gait speed. No evidence of imbalance.   Gait with Narrow Base of Support Ambulates 7-9 steps.   Gait with Eyes Closed Walks 20 ft, no assistive devices, good speed, no evidence of imbalance, normal gait pattern, deviates no more than 6 in outside 12 in walkway width. Ambulates 20 ft in less than 7 sec.   Ambulating Backwards Walks 20 ft, uses assistive device, slower speed, mild gait deviations, deviates 6-10 in outside 12 in walkway  width.   Steps Alternating feet, no rail.   Total Score 28                     [facility] Adult PT Treatment/Exercise - 25-Feb-2015 0809    Ambulation/Gait   Ambulation/Gait Yes   Ambulation/Gait Assistance 7: Independent   Ambulation/Gait Assistance Details Pt was able to amb. over even/uneven terrain without LOB.   Ambulation Distance (Feet) 700 Feet   Assistive device None   Gait Pattern Step-through pattern;Decreased dorsiflexion - right;Decreased dorsiflexion - left;Wide base of support;Decreased trunk rotation  improved arm swing   Ambulation Surface Level;Unlevel;Indoor;Outdoor;Paved;Grass   Gait velocity 3.56f/sec.  no AD   Curb 7: Independent   Standardized  Balance Assessment   Standardized Balance Assessment Timed Up and Go Test   Timed Up and Go Test   TUG Normal TUG;Cognitive TUG   Normal TUG (seconds) 8.6  no AD   Cognitive TUG (seconds) 10.7  no AD     GROC: +7, pt reports "I feel better than 7, I feel fantastic and can sleep at night and I feel more positive that things will be all right".           PT Education - [id number]   Education provided Yes   Education Details PT educated pt on the importance of continuing to perform HEP and to join [facility]! class when he is able to.   Person(s) Educated Patient   Methods Explanation   Comprehension Verbalized understanding          PT Short Term Goals - 01/10/15 1054    PT SHORT TERM GOAL #1   Title Same as LTGs.           PT Long Term Goals - 0Sep 20, [id number]    PT LONG TERM GOAL #1   Title Pt will be independent in HEP to improve balance and endurance. Target date: 02/07/15   Status Achieved   PT LONG TERM GOAL #2   Title Pt will improve BERG to >/=53/56 to decrease falls risk. Target date: 02/07/15.   Status Achieved   PT LONG TERM GOAL #3   Title Have pt finish FOTO and write goal. Target date: 02/07/15.   Status Achieved   PT LONG TERM GOAL #4   Title Perform FGA and write goal if appropriate. Target date: 02/07/15.   Status Achieved   PT LONG TERM GOAL #5   Title Pt will perform cognitive TUG in <15 seconds to decr. falls risk. Target date: 02/07/15.   Status Achieved   PT LONG TERM GOAL #6   Title Pt will ambulate 600' over even/uneven terrain, independently, to improve functional mobility. Target date: 02/07/15.   Status Achieved   PT LONG TERM GOAL #7   Title Pt will improve FGA score to >/=28/30 to decrease falls risk. Target date: 02/07/15.   Status Achieved   PT LONG TERM GOAL #8   Title Pt will improve FOTO physical fear score by 10 points to improve quality of life. Target date: 02/07/15.   Baseline adjusted by FOTO: 57   Status Deferred  pt had difficulty  completing on tablet               Plan - [id number]   Clinical Impression Statement Pt met all LTGs. Pt is discharging, please see d/c summary for details.          G-Codes - [id number]   Functional Assessment Tool Used  BERG: 53/56; cognitive TUG: 10.7 sec. no AD; normal TUG: 8.6 sec. no AD; gait speed: 3.37f/sec. no AD   Functional Limitation Mobility: Walking and moving around   Mobility: Walking and Moving Around Goal Status [phone] At least 1 percent but less than 20 percent impaired, limited or restricted   Mobility: Walking and Moving Around Discharge Status [phone] At least 1 percent but less than 20 percent impaired, limited or restricted      Problem List Patient Active Problem List   Diagnosis Date Noted  . Abnormal dreams 09/06/2013  . 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 Hyperglycemia 05/18/2013  . Paralysis agitans 05/18/2013  . Unspecified vitamin D deficiency 05/18/2013  . Hyperlipidemia LDL goal < 100 05/18/2013  . Parkinson's disease 10/26/2012    Miller,Jennifer L 02/06/2015, 8:36 AM  [place] [place] [place] [address]Phone: [phone]  Fax:  [phone] PHYSICAL THERAPY DISCHARGE SUMMARY  Visits from Start of Care: 7  Current functional level related to goals / functional outcomes:     PT Long Term Goals - 02/06/15 0834    PT LONG TERM GOAL #1   Title Pt will be independent in HEP to improve balance and endurance. Target date: 02/07/15   Status Achieved   PT LONG TERM GOAL #2   Title Pt will improve BERG to >/=53/56 to decrease falls risk. Target date: 02/07/15.   Status Achieved   PT LONG TERM GOAL #3   Title Have pt finish FOTO and write goal. Target date: 02/07/15.   Status Achieved   PT LONG TERM GOAL #4   Title Perform FGA and write goal if appropriate. Target date: 02/07/15.   Status Achieved   PT LONG TERM GOAL #5   Title Pt will perform  cognitive TUG in <15 seconds to decr. falls risk. Target date: 02/07/15.   Status Achieved   PT LONG TERM GOAL #6   Title Pt will ambulate 600' over even/uneven terrain, independently, to improve functional mobility. Target date: 02/07/15.   Status Achieved   PT LONG TERM GOAL #7   Title Pt will improve FGA score to >/=28/30 to decrease falls risk. Target date: 02/07/15.   Status Achieved   PT LONG TERM GOAL #8   Title Pt will improve FOTO physical fear score by 10 points to improve quality of life. Target date: 02/07/15.   Baseline adjusted by FOTO: 57   Status Deferred  pt had difficulty completing on tablet        Remaining deficits: Decreased trunk rotation   Education / Equipment: HEP and information on how to join Parkinson's class  Plan: Patient agrees to discharge.  Patient goals were not met. Patient is being discharged due to meeting the stated rehab goals.  ?????        JGeoffry Paradise PT,DPT 02/06/2015 8:36 AM Phone: [phone]: [phone]

## 2015-02-17 ENCOUNTER — Encounter: Payer: Self-pay | Admitting: Diagnostic Neuroimaging

## 2015-02-17 ENCOUNTER — Ambulatory Visit (INDEPENDENT_AMBULATORY_CARE_PROVIDER_SITE_OTHER): Payer: Medicare Other | Admitting: Diagnostic Neuroimaging

## 2015-02-17 VITALS — BP 129/70 | HR 65 | Ht 71.5 in | Wt 245.0 lb

## 2015-02-17 DIAGNOSIS — G4752 REM sleep behavior disorder: Secondary | ICD-10-CM

## 2015-02-17 DIAGNOSIS — M48062 Spinal stenosis, lumbar region with neurogenic claudication: Secondary | ICD-10-CM

## 2015-02-17 DIAGNOSIS — G20 Parkinson's disease: Secondary | ICD-10-CM

## 2015-02-17 DIAGNOSIS — M4806 Spinal stenosis, lumbar region: Secondary | ICD-10-CM | POA: Diagnosis not present

## 2015-02-17 MED ORDER — CARBIDOPA-LEVODOPA 25-100 MG PO TABS: 1.5000 | ORAL_TABLET | Freq: Three times a day (TID) | ORAL | Status: DC

## 2015-02-17 MED ORDER — PRAMIPEXOLE DIHYDROCHLORIDE 1 MG PO TABS: 2.0000 mg | ORAL_TABLET | Freq: Two times a day (BID) | ORAL | Status: DC

## 2015-02-17 NOTE — Patient Instructions (Signed)
Try melatonin at bedtime to help sleep.  Continue carbidopa and pramipexole.

## 2015-02-17 NOTE — Progress Notes (Signed)
PATIENT: Theodore King DOB: [birth year]   REASON FOR VISIT: follow up HISTORY FROM: patient and GF Romie Minus)  Chief Complaint  Patient presents with  . Parkinson's Disease    rm 6, girl friend - Romie Minus  . Follow-up    6 month    HISTORY OF PRESENT ILLNESS:  UPDATE 02/17/15: RBD symptoms worsening; has stopped clonazepam because it is not working. Left groin pull is better. PD overall stable. No wearing off. Tremor stable. No fall.s  UPDATE 08/13/14: Since last visit, doing well from PD standpoint. Tremor and gait stable. Some new left groin pulling sensation x 3 weeks, without reported injury. Also pain/weakness in legs with standing for long time.  UPDATE 01/29/14: Since last visit, doing well. Clonazepam is helping his RBD symptoms, but he would like to to increase from 67m qhs up to 1.567mqhs. Tremor is stable. Tolerating carb/levo and pramipexole.  UPDATE 07/06/13 (LL):  Patient's gf requesting sooner appt than 11/05/13, which is yearly f/u. Last OV was 10/26/12. Patient's girlfriend states that patient is having sleeping problems, moving and talking in his sleep.  This is causing patient concern, he is worried that he may accidentally hurt girlfriend during sleep.  He would like treatment. Otherwise no changes, minor tremor in hand, not bothersome. Tolerating medications well.  UPDATE 10/26/12: Patient is doing extremity well since last visit. Increased carbidopa/levodopa dosing has essentially removed his tremors. He very rarely feels tremor in his left arm. Patient is competed in the senior games over past few months and won [employer] several gold). He won the [employer] competing for the first time in his life. 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 Overall doing great.   UPDATE 05/10/12: Feels just a little better, continues with mild tremors. Denies any falls. He continues to work out 3-4 times  per week. His only complaint today is he continues to feel embarassed with his tremors.   UPDATE 03/06/12: Feels like doing better with the Pramipixole. Continues to have mild tremor. Denies any falls. Works out 3-4 days per week. He does feel that his tremors can improve more to help him to not feel embarrased.   UPDATE 01/04/12: Couldn't afford meds in end of june and for last 3 weeks of June was taking less meds than rx'd. Tremor worsened. Now has insurance and getting better tremor control, but still present. Still with int drooling. No constipation. No wearing off. No dyskinesias. No constipation.   UPDATE 10/27/11: Doing about the same. Not much benefit with carb/levo. No side effects. Still with tremor (rest and action). No falls. Works out 3x per week. Recent gold medal at [employer]  PRIOR HPI: [age]ear old left-handed male with no past medical history, here for evaluation of tremor. Since November 2012, patient has developed progressive tremor in his left hand greater than right hand. He also feels slower with fine finger movements and coordination. His girlfriend is noticed short shuffling steps with walking. Symptoms have been progressive over last few months. Patient had an injury in 2012 with rotator cuff tear. He thinks that since that time his symptoms have been worse. He denies change in smell or taste, vivid dreams, hallucinations, constipation or swallowing difficulty.    REVIEW OF SYSTEMS: Full 14 system review of systems performed and notable only for as per HPI.   ALLERGIES: No Known Allergies  HOME MEDICATIONS: Outpatient Prescriptions Prior to  Visit  Medication Sig Dispense Refill  . albuterol (PROVENTIL HFA;VENTOLIN HFA) 108 (90 BASE) MCG/ACT inhaler Inhale 2 puffs into the lungs every 6 (six) hours as needed for wheezing or shortness of breath. (Patient not taking: Reported on 02/17/2015) 1 Inhaler 2  . carbidopa-levodopa (SINEMET IR) 25-100 MG per tablet Take 1.5 tablets  by mouth 3 (three) times daily. 540 tablet 4  . Cholecalciferol (VITAMIN D PO) Take by mouth daily.    . clonazePAM (KLONOPIN) 0.5 MG tablet Take 3 tablets (1.5 mg total) by mouth at bedtime. (Patient not taking: Reported on 02/17/2015) 90 tablet 5  . cyclobenzaprine (FLEXERIL) 10 MG tablet Take 1 tablet (10 mg total) by mouth 3 (three) times daily as needed for muscle spasms. (Patient not taking: Reported on 02/17/2015) 30 tablet 0  . glucose blood (ACCU-CHEK AVIVA) test strip Use as instructed 100 each 12  . Lancets Misc. (ACCU-CHEK MULTICLIX LANCET DEV) KIT 1 Device by Does not apply route daily with breakfast. 100 each 3  . pramipexole (MIRAPEX) 1 MG tablet Take 2 tablets (2 mg total) by mouth 2 (two) times daily. 360 tablet 4   No facility-administered medications prior to visit.    PAST MEDICAL HISTORY: Past Medical History  Diagnosis Date  . Parkinson's disease   . Other and unspecified hyperlipidemia   . Unspecified vitamin D deficiency   . Other and unspecified hyperlipidemia   . 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 Unspecified vitamin D deficiency   . Disorders of bursae and tendons in shoulder region, unspecified     PAST SURGICAL HISTORY: Past Surgical History  Procedure Laterality Date  . Appendectomy    . Rotator cuff repair      right    FAMILY HISTORY: Family History  Problem Relation Age of Onset  . Colon cancer Neg Hx   . Esophageal cancer Neg Hx   . Rectal cancer Neg Hx   . Stomach cancer Neg Hx   . Cancer Mother   . Cancer Father   . Stroke Brother   . Cancer Sister   . Cancer Sister     SOCIAL HISTORY: Social History   Social History  . Marital Status: Legally Separated    Spouse Name: N/A  . Number of Children: 3  . Years of Education: 12th   Occupational History  .     Social History Main Topics  . Smoking status: Former Research scientist (life sciences)  . Smokeless tobacco: Never Used  . Alcohol Use: Yes     Comment: occasionally ("a sip every  now and then")  . Drug Use: No  . Sexual Activity: No   Other Topics Concern  . Not on file   Social History Narrative     PHYSICAL EXAM  Filed Vitals:   02/17/15 1557  BP: 129/70  Pulse: 65  Height: 5' 11.5" (1.816 m)  Weight: 245 lb (111.131 kg)   Body mass index is 33.7 kg/(m^2).  Generalized: Well developed, in no acute distress  Neck: Supple, no carotid bruits  Cardiac: Regular rate rhythm, no murmur   Neurological examination  MENTAL STATUS: awake, alert, language fluent, comprehension intact, naming intact; MASKED FACIES. BORDERLINE MYERSONS.  CRANIAL NERVE: pupils equal and reactive to light, visual fields full to confrontation, extraocular muscles intact, no nystagmus, facial sensation and strength symmetric, uvula midline, shoulder shrug symmetric, tongue midline. HOARSE VOICE  MOTOR: POSTURAL TREMOR IN RUE; NO RIGIDITY. MILD BRADYKINESIA IN LUE. Normal bulk and tone, full strength in the BUE,  BLE.  SENSORY: normal and symmetric to light touch.  COORDINATION: finger-nose-finger, fine finger movements normal  REFLEXES: deep tendon reflexes present and symmetric  GAIT/STATION: SLIGHTLY STOOPED POSTURE. DECR ARM SWING. MILD TREMOR IN LUE WITH WALKING. SMOOTH TURING. Narrow based gait.    DIAGNOSTICS  09/09/14 MRI lumbar  1. At L4-5: facet hypertrophy, left facet bone spurring, disc bulging with mild spinal stenosis and mild biforaminal stenosis; left lateral recess stenosis noted as well. 2. At L5-S1: facet hypertrophy and spondylitic spurring with mild biforaminal stenosis. 3. Multi-level degenerative changes from L3-4 to L5-S1, including mild endplate marrow edema at L4-5 on degenerative basis. 4. Sacralization of L5 with transitional features. If surgical/procedural intervention     ASSESSMENT AND PLAN 69 y.o. male with progressive resting tremor, bradykinesia, cogwheel rigidity, postural instability, consistent with parkinson's disease (with favorable response  to carb/levo and pramipexole). Also with REM behavior sleep disorder sxs but not good benefit with clonazepam.   PLAN:  - continue carbidopa/levodopa 25/146m 1.5 tab PO TID  - continue pramipexole 258mBID  - trial of melatonin  Return in about 6 months (around 08/17/2015).     VIPenni BombardMD 02/13/06/07214:7:11M Certified in Neurology, Neurophysiology and Neuroimaging  [facility] [id number]SuDe [place]rer[place] [phone]

## 2015-06-04 ENCOUNTER — Ambulatory Visit: Payer: Self-pay | Admitting: Diagnostic Neuroimaging

## 2015-06-23 ENCOUNTER — Ambulatory Visit: Payer: Self-pay | Admitting: Diagnostic Neuroimaging

## 2015-06-24 ENCOUNTER — Encounter: Payer: Self-pay | Admitting: Diagnostic Neuroimaging

## 2015-07-07 ENCOUNTER — Ambulatory Visit: Payer: Medicare Other | Admitting: Diagnostic Neuroimaging

## 2015-07-15 ENCOUNTER — Ambulatory Visit: Payer: Self-pay | Admitting: Diagnostic Neuroimaging

## 2015-07-16 ENCOUNTER — Encounter: Payer: Self-pay | Admitting: Diagnostic Neuroimaging

## 2015-07-23 ENCOUNTER — Ambulatory Visit: Payer: Self-pay | Admitting: Diagnostic Neuroimaging

## 2015-07-24 ENCOUNTER — Encounter: Payer: Self-pay | Admitting: Diagnostic Neuroimaging

## 2015-08-18 ENCOUNTER — Ambulatory Visit (INDEPENDENT_AMBULATORY_CARE_PROVIDER_SITE_OTHER): Payer: Medicare Other | Admitting: Diagnostic Neuroimaging

## 2015-08-18 ENCOUNTER — Encounter: Payer: Self-pay | Admitting: Diagnostic Neuroimaging

## 2015-08-18 VITALS — BP 150/86 | HR 85 | Ht 71.0 in | Wt 253.0 lb

## 2015-08-18 DIAGNOSIS — G20 Parkinson's disease: Secondary | ICD-10-CM | POA: Diagnosis not present

## 2015-08-18 DIAGNOSIS — M4806 Spinal stenosis, lumbar region: Secondary | ICD-10-CM | POA: Diagnosis not present

## 2015-08-18 DIAGNOSIS — M48062 Spinal stenosis, lumbar region with neurogenic claudication: Secondary | ICD-10-CM

## 2015-08-18 MED ORDER — RASAGILINE MESYLATE 0.5 MG PO TABS: 0.5000 mg | ORAL_TABLET | Freq: Every day | ORAL | Status: DC

## 2015-08-18 MED ORDER — PRAMIPEXOLE DIHYDROCHLORIDE 1 MG PO TABS: 2.0000 mg | ORAL_TABLET | Freq: Two times a day (BID) | ORAL | Status: DC

## 2015-08-18 MED ORDER — CARBIDOPA-LEVODOPA 25-100 MG PO TABS: 2.0000 | ORAL_TABLET | Freq: Three times a day (TID) | ORAL | Status: DC

## 2015-08-18 NOTE — Patient Instructions (Signed)
-   INCREASE carbidopa/levodopa 25/100mg  to 2 tab three times per day - continue pramipexole 2mg  twice a day - ADD azilect 0.5mg  daily - physical therapy evaluation - use rollator walker with bench and brakes  - consider deep brain stimulation for parkinson's disease - see websites: https://long-stone.com/ or [email or web address]

## 2015-08-18 NOTE — Progress Notes (Signed)
PATIENT: Theodore King DOB: 07/04/1945   REASON FOR VISIT: follow up HISTORY FROM: patient and GF Romie Minus)  Chief Complaint  Patient presents with  . Parkinson's disease    rm 7, girlfriend - Kirk Ruths, "tremors are worse in arms/hands, mostly on L side"  . Follow-up    6 month    HISTORY OF PRESENT ILLNESS:  UPDATE 08/18/15: Since last visit, has had decline in movement and walking in last 1 month. More bradykinesia, gait diff and tremors. Meds not working as well as before. More back pain.   UPDATE 02/17/15: RBD symptoms worsening; has stopped clonazepam because it is not working. Left groin pull is better. PD overall stable. No wearing off. Tremor stable. No falls.  UPDATE 08/13/14: Since last visit, doing well from PD standpoint. Tremor and gait stable. Some new left groin pulling sensation x 3 weeks, without reported injury. Also pain/weakness in legs with standing for long time.  UPDATE 01/29/14: Since last visit, doing well. Clonazepam is helping his RBD symptoms, but he would like to to increase from 1mg  qhs up to 1.5mg  qhs. Tremor is stable. Tolerating carb/levo and pramipexole.  UPDATE 07/06/13 (LL):  Patient's gf requesting sooner appt than 11/05/13, which is yearly f/u. Last OV was 10/26/12. Patient's girlfriend states that patient is having sleeping problems, moving and talking in his sleep.  This is causing patient concern, he is worried that he may accidentally hurt girlfriend during sleep.  He would like treatment. Otherwise no changes, minor tremor in hand, not bothersome. Tolerating medications well.  UPDATE 10/26/12: Patient is doing extremity well since last visit. Increased carbidopa/levodopa dosing has essentially removed his tremors. He very rarely feels tremor in his left arm. Patient is competed in the senior games over past few months and won [employer] (including several gold). He won the [employer] after competing for the first time in his life. 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 Overall doing great.   UPDATE 05/10/12: Feels just a little better, continues with mild tremors. Denies any falls. He continues to work out 3-4 times per week. His only complaint today is he continues to feel embarassed with his tremors.   UPDATE 03/06/12: Feels like doing better with the Pramipixole. Continues to have mild tremor. Denies any falls. Works out 3-4 days per week. He does feel that his tremors can improve more to help him to not feel embarrased.   UPDATE 01/04/12: Couldn't afford meds in end of june and for last 3 weeks of June was taking less meds than rx'd. Tremor worsened. Now has insurance and getting better tremor control, but still present. Still with int drooling. No constipation. No wearing off. No dyskinesias. No constipation.   UPDATE 10/27/11: Doing about the same. Not much benefit with carb/levo. No side effects. Still with tremor (rest and action). No falls. Works out 3x per week. Recent gold medal at [employer].   PRIOR HPI: 70 year old left-handed male with no past medical history, here for evaluation of tremor. Since November 2012, patient has developed progressive tremor in his left hand greater than right hand. He also feels slower with fine finger movements and coordination. His girlfriend is noticed short shuffling steps with walking. Symptoms have been progressive over last few months. Patient had an injury in 2012 with rotator cuff tear. He thinks that since that time his symptoms have been worse. He denies change in  smell or taste, vivid dreams, hallucinations, constipation or swallowing difficulty.    REVIEW OF SYSTEMS: Full 14 system review of systems performed and negative except for eye itchign fatigue pain tremors sleep talking nervous.    ALLERGIES: No Known Allergies  HOME MEDICATIONS: Outpatient Prescriptions Prior to Visit  Medication Sig Dispense Refill    . carbidopa-levodopa (SINEMET IR) 25-100 MG per tablet Take 1.5 tablets by mouth 3 (three) times daily. 540 tablet 4  . Cholecalciferol (VITAMIN D PO) Take by mouth daily.    . pramipexole (MIRAPEX) 1 MG tablet Take 2 tablets (2 mg total) by mouth 2 (two) times daily. 360 tablet 4  . albuterol (PROVENTIL HFA;VENTOLIN HFA) 108 (90 BASE) MCG/ACT inhaler Inhale 2 puffs into the lungs every 6 (six) hours as needed for wheezing or shortness of breath. (Patient not taking: Reported on 02/17/2015) 1 Inhaler 2   No facility-administered medications prior to visit.    PAST MEDICAL HISTORY: Past Medical History  Diagnosis Date  . Parkinson's disease ([place])   . Other and unspecified hyperlipidemia   . Unspecified vitamin D deficiency   . Other and unspecified hyperlipidemia   . 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 Unspecified vitamin D deficiency   . Disorders of bursae and tendons in shoulder region, unspecified     PAST SURGICAL HISTORY: Past Surgical History  Procedure Laterality Date  . Appendectomy    . Rotator cuff repair      right    FAMILY HISTORY: Family History  Problem Relation Age of Onset  . Colon cancer Neg Hx   . Esophageal cancer Neg Hx   . Rectal cancer Neg Hx   . Stomach cancer Neg Hx   . Cancer Mother   . Cancer Father   . Stroke Brother   . Cancer Sister   . Cancer Sister     SOCIAL HISTORY: Social History   Social History  . Marital Status: Legally Separated    Spouse Name: N/A  . Number of Children: 3  . Years of Education: 12th   Occupational History  .     Social History Main Topics  . Smoking status: Former Research scientist (life sciences)  . Smokeless tobacco: Never Used  . Alcohol Use: Yes     Comment: occasionally ("a sip every now and then")  . Drug Use: No  . Sexual Activity: No   Other Topics Concern  . Not on file   Social History Narrative     PHYSICAL EXAM  Filed Vitals:   08/18/15 1253  BP: 150/86  Pulse: 85  Height: 5\' 11"   (1.803 m)  Weight: 253 lb (114.76 kg)   Body mass index is 35.3 kg/(m^2).  Generalized: Well developed, in no acute distress  Neck: Supple, no carotid bruits  Cardiac: Regular rate rhythm, no murmur   Neurological examination  MENTAL STATUS: awake, alert, language fluent, comprehension intact, naming intact; MASKED FACIES. BORDERLINE MYERSONS.  CRANIAL NERVE: pupils equal and reactive to light, visual fields full to confrontation, extraocular muscles intact, no nystagmus, facial sensation and strength symmetric, uvula midline, shoulder shrug symmetric, tongue midline. HOARSE VOICE  MOTOR: POSTURAL AND RESTING TREMOR IN RUE; NO RIGIDITY. MODERATE BRADYKINESIA IN BLE > BUE. Normal bulk and tone, full strength in the BUE, BLE.  SENSORY: normal and symmetric to light touch.  COORDINATION: finger-nose-finger, fine finger movements normal  REFLEXES: deep tendon reflexes present and symmetric  GAIT/STATION: SLIGHTLY STOOPED POSTURE. DECR ARM SWING. MILD TREMOR IN LUE WITH WALKING. SMOOTH TURING.  IN WHEEL CHAIR; DIFF TIME STANDING; STOOPED, FALLING BACK; LEFT HAND REST TREMOR WITH STANDING    DIAGNOSTICS  Lab Results  Component Value Date   WBC 2.7* 02/21/2008   HGB 15.0 02/21/2008   HCT 44.1 02/21/2008   MCV 81.6 02/21/2008   PLT 104* 02/21/2008   CMP Latest Ref Rng 09/12/2013 05/17/2013 11/16/2012  Glucose 65 - 99 mg/dL 115(H) 104(H) 100(H)  BUN 8 - 27 mg/dL 15 18 13   Creatinine 0.76 - 1.27 mg/dL 1.03 1.41(H) 1.17  Sodium 134 - 144 mmol/L 141 141 141  Potassium 3.5 - 5.2 mmol/L 4.2 4.8 4.2  Chloride 97 - 108 mmol/L 104 100 103  CO2 18 - 29 mmol/L 21 23 27   Calcium 8.6 - 10.2 mg/dL 9.4 9.6 9.5    09/09/14 MRI lumbar  1. At L4-5: facet hypertrophy, left facet bone spurring, disc bulging with mild spinal stenosis and mild biforaminal stenosis; left lateral recess stenosis noted as well. 2. At L5-S1: facet hypertrophy and spondylitic spurring with mild biforaminal stenosis. 3.  Multi-level degenerative changes from L3-4 to L5-S1, including mild endplate marrow edema at L4-5 on degenerative basis. 4. Sacralization of L5 with transitional features. If surgical/procedural intervention     ASSESSMENT AND PLAN 70 y.o. male with progressive resting tremor, bradykinesia, cogwheel rigidity, postural instability, consistent with parkinson's disease (with favorable response to carb/levo and pramipexole). Also with REM behavior sleep disorder sxs but not good benefit with clonazepam.   Dx:  Parkinson's disease ([place]) - Plan: For home use only DME 4 wheeled rolling walker with seat, Ambulatory referral to Physical Therapy  Spinal stenosis, lumbar region, with neurogenic claudication - Plan: For home use only DME 4 wheeled rolling walker with seat, Ambulatory referral to Physical Therapy    PLAN:  - INCREASE carbidopa/levodopa 25/100mg  to 2 tab PO TID  - continue pramipexole 2mg  BID  - ADD azilect 0.5mg  daily - PT eval - rollator walker  Orders Placed This Encounter  Procedures  . For home use only DME 4 wheeled rolling walker with seat  . Ambulatory referral to Physical Therapy   Meds ordered this encounter  Medications  . pramipexole (MIRAPEX) 1 MG tablet    Sig: Take 2 tablets (2 mg total) by mouth 2 (two) times daily.    Dispense:  360 tablet    Refill:  4  . carbidopa-levodopa (SINEMET IR) 25-100 MG tablet    Sig: Take 2 tablets by mouth 3 (three) times daily.    Dispense:  540 tablet    Refill:  4  . rasagiline (AZILECT) 0.5 MG TABS tablet    Sig: Take 1 tablet (0.5 mg total) by mouth daily.    Dispense:  30 tablet    Refill:  12   Return in about 6 weeks (around 09/29/2015).     Penni Bombard, MD [id number], [id number] PM Certified in Neurology, Neurophysiology and Neuroimaging  [facility] [address], [place], [place] [address] [phone]

## 2015-08-24 ENCOUNTER — Emergency Department (HOSPITAL_COMMUNITY): Payer: Medicare Other

## 2015-08-24 ENCOUNTER — Encounter (HOSPITAL_COMMUNITY): Payer: Self-pay | Admitting: Emergency Medicine

## 2015-08-24 ENCOUNTER — Emergency Department (HOSPITAL_COMMUNITY)
Admission: EM | Admit: 2015-08-24 | Discharge: 2015-08-24 | Disposition: A | Discharge status: Home / Self Care | Payer: Medicare Other | Attending: Emergency Medicine | Admitting: Emergency Medicine

## 2015-08-24 DIAGNOSIS — G20 Parkinson's disease: Secondary | ICD-10-CM | POA: Diagnosis not present

## 2015-08-24 DIAGNOSIS — M545 Low back pain, unspecified: Secondary | ICD-10-CM

## 2015-08-24 DIAGNOSIS — Z8739 Personal history of other diseases of the musculoskeletal system and connective tissue: Secondary | ICD-10-CM | POA: Insufficient documentation

## 2015-08-24 DIAGNOSIS — E559 Vitamin D deficiency, unspecified: Secondary | ICD-10-CM | POA: Diagnosis not present

## 2015-08-24 DIAGNOSIS — Z79899 Other long term (current) drug therapy: Secondary | ICD-10-CM | POA: Insufficient documentation

## 2015-08-24 DIAGNOSIS — Z87891 Personal history of nicotine dependence: Secondary | ICD-10-CM | POA: Insufficient documentation

## 2015-08-24 MED ORDER — OXYCODONE-ACETAMINOPHEN 5-325 MG PO TABS
1.0000 | ORAL_TABLET | Freq: Once | ORAL | Status: AC
  Administered 2015-08-24: 1 via ORAL
  Filled 2015-08-24: qty 1

## 2015-08-24 NOTE — ED Provider Notes (Signed)
CSN: [id number]     Arrival date & time 08/24/15  1009 History   First MD Initiated Contact with Patient 08/24/15 1156     Chief Complaint  Patient presents with  . Back Pain     (Consider location/radiation/quality/duration/timing/severity/associated sxs/prior Treatment) HPI   Patient is a 70 year old male with past medical history of Parkinson's presents to the ED with complaint of low back pain, onset 2 weeks. Patient reports having waxing and waning sharp/burning pain to his low back that radiates down both legs with associated tingly. He notes the pain is worse with walking or bending. Patient states he has been taking oxycodone at home intermittently with relief. Denies any recent fall, trauma, injury. Pt denies fever, numbness, saddle anesthesia, loss of bowel or bladder, urinary retention, abdominal pain, nausea, vomiting, diarrhea, chest pain, shortness of breath, cough weakness, IVDU, cancer or recent spinal manipulation.   Past Medical History  Diagnosis Date  . Parkinson's disease ([place])   . Other and unspecified hyperlipidemia   . Unspecified vitamin D deficiency   . Other and unspecified hyperlipidemia   . 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 Unspecified vitamin D deficiency   . Disorders of bursae and tendons in shoulder region, unspecified    Past Surgical History  Procedure Laterality Date  . Appendectomy    . Rotator cuff repair      right   Family History  Problem Relation Age of Onset  . Colon cancer Neg Hx   . Esophageal cancer Neg Hx   . Rectal cancer Neg Hx   . Stomach cancer Neg Hx   . Cancer Mother   . Cancer Father   . Stroke Brother   . Cancer Sister   . Cancer Sister    Social History  Substance Use Topics  . Smoking status: Former Research scientist (life sciences)  . Smokeless tobacco: Never Used  . Alcohol Use: Yes     Comment: occasionally ("a sip every now and then")    Review of Systems  Musculoskeletal: Positive for back pain.  All other  systems reviewed and are negative.     Allergies  Review of patient's allergies indicates no known allergies.  Home Medications   Prior to Admission medications   Medication Sig Start Date End Date Taking? Authorizing Provider  carbidopa-levodopa (SINEMET IR) 25-100 MG tablet Take 2 tablets by mouth 3 (three) times daily. Patient taking differently: Take 1.5 tablets by mouth 3 (three) times daily.  08/18/15  Yes Penni Bombard, MD  Cholecalciferol (VITAMIN D PO) Take 1,000 Units by mouth daily.    Yes Historical Provider, MD  diazepam (VALIUM) 5 MG tablet Take 5 mg by mouth 2 (two) times daily as needed for anxiety.  06/25/15  Yes Historical Provider, MD  oxyCODONE-acetaminophen (PERCOCET/ROXICET) 5-325 MG tablet Take 1 tablet by mouth every 6 (six) hours as needed. pain 08/19/15  Yes Historical Provider, MD  pramipexole (MIRAPEX) 1 MG tablet Take 2 tablets (2 mg total) by mouth 2 (two) times daily. 08/18/15  Yes Penni Bombard, MD  predniSONE (DELTASONE) 10 MG tablet Take 10 mg by mouth 2 (two) times daily. 15 Day Course: Start Date 08/12/15 & End Date 08/27/15. 08/12/15  Yes Historical Provider, MD  rasagiline (AZILECT) 0.5 MG TABS tablet Take 1 tablet (0.5 mg total) by mouth daily. 08/18/15  Yes Penni Bombard, MD  thiamine (VITAMIN B-1) 100 MG tablet Take 100 mg by mouth daily.   Yes Historical Provider, MD  albuterol (PROVENTIL HFA;VENTOLIN  HFA) 108 (90 BASE) MCG/ACT inhaler Inhale 2 puffs into the lungs every 6 (six) hours as needed for wheezing or shortness of breath. Patient not taking: Reported on 02/17/2015 07/04/13   Lauree Chandler, NP   BP 123/56 mmHg  Pulse 59  Temp(Src) 97.7 F (36.5 C) (Oral)  Resp 18  SpO2 97% Physical Exam  Constitutional: He is oriented to person, place, and time. He appears well-developed and well-nourished. No distress.  HENT:  Head: Normocephalic and atraumatic.  Mouth/Throat: Oropharynx is clear and moist. No oropharyngeal exudate.  Eyes:  Conjunctivae and EOM are normal. Right eye exhibits no discharge. Left eye exhibits no discharge. No scleral icterus.  Neck: Normal range of motion. Neck supple.  Cardiovascular: Normal rate, regular rhythm, normal heart sounds and intact distal pulses.   Pulmonary/Chest: Effort normal and breath sounds normal. No respiratory distress. He has no wheezes. He has no rales. He exhibits no tenderness.  Abdominal: Soft. Bowel sounds are normal. He exhibits no distension and no mass. There is no tenderness. There is no rebound and no guarding.  Musculoskeletal: He exhibits no edema.  No midline C, T tenderness. Midline lumbar tenderness. Full range of motion of neck and back. Full range of motion of bilateral upper and lower extremities, with 5/5 strength. Sensation intact. 2+ radial and PT pulses. Cap refill <2 seconds. Patient able to stand and ambulate without assistance.  Negative straight leg raise bilaterally.  Neurological: He is alert and oriented to person, place, and time. He has normal strength and normal reflexes. No sensory deficit.  Skin: Skin is warm and dry. He is not diaphoretic.  Nursing note and vitals reviewed.   ED Course  Procedures (including critical care time) Labs Review Labs Reviewed - No data to display  Imaging Review Dg Lumbar Spine Complete  08/24/2015  CLINICAL DATA:  Low back pain radiating into both lower extremities. No reported injury. EXAM: LUMBAR SPINE - COMPLETE 4+ VIEW COMPARISON:  09/09/2014 lumbar spine MRI FINDINGS: This report assumes 5 non rib-bearing lumbar vertebrae. Please note that the L5 level is transitional and sacralized. This numbering system is different than the numbering system used on the 09/09/2014 lumbar spine MRI report (the L1 level described on the 09/09/2014 lumbar spine MRI report demonstrates bilateral ribs on these radiographs and is considered the T12 level on this report) . Lumbar vertebral body heights are preserved, with no fracture.  Moderate to severe degenerative disc disease at L3-4 and L4-5, unchanged. Mild-to-moderate spondylosis throughout the remaining lumbar levels. No spondylolisthesis. Mild bilateral facet arthropathy in the lower lumbar spine. Probable moderate foraminal stenosis bilaterally at L4-5. No aggressive appearing focal osseous lesions. Atherosclerotic abdominal aorta. IMPRESSION: Moderate to severe degenerative disc disease at L3-4 and L4-5, unchanged. Probable moderate foraminal stenosis bilaterally at L4-5. Please see comments regarding lumbar level numbering. Electronically Signed   By: Ilona Sorrel M.D.   On: 08/24/2015 12:57   I have personally reviewed and evaluated these images and lab results as part of my medical decision-making.   EKG Interpretation None      MDM   Final diagnoses:  Bilateral low back pain without sciatica    Patient with back pain.  No neurological deficits and normal neuro exam.  Patient can walk but states is painful.  No loss of bowel or bladder control.  No concern for cauda equina.  No fever, night sweats, weight loss, h/o cancer, IVDU.  Lumbar spine x-ray revealed degenerative disc disease, no acute abnormalities. Patient given  Percocet in the ED. On reevaluation patient reports his pain has improved. Discussed results and plan for discharge with patient. RICE protocol and pain medicine indicated and discussed with patient.  Advised patient to follow up with his PCP.    Chesley Noon [place], [place] 08/24/15 1433  Nat Christen, MD 08/24/15 1524

## 2015-08-24 NOTE — ED Notes (Signed)
Pt c/o low back pain radiating down both legs, onset x 2 weeks ago without injury. Pt states he saw his doctor last Monday and was given oxycodone for pain but pain persists.

## 2015-08-24 NOTE — Discharge Instructions (Signed)
Continue taking her 5 mg of Percocet every 6 hours as needed for pain relief. I recommend refraining from doing any heavy lifting, bending or repetitive movements that exacerbate your pain for the next few days. Follow-up with your primary care provider in 4-5 days. Please return to the Emergency Department if symptoms worsen or new onset of fever, numbness, tingling, groin anesthesia, loss of bowel or bladder, weakness.

## 2015-08-24 NOTE — ED Notes (Signed)
Pt states lower back pain beginning x 2 weeks ago radiating down right leg, denies any difficulty urinating. States he wasn't doing anything specific when it started, did not injure it that he's aware of.

## 2015-09-30 ENCOUNTER — Ambulatory Visit: Payer: Medicare Other | Admitting: Diagnostic Neuroimaging

## 2016-01-08 ENCOUNTER — Ambulatory Visit: Payer: Self-pay | Admitting: Neurology

## 2016-01-09 ENCOUNTER — Encounter: Payer: Self-pay | Admitting: Neurology

## 2016-02-23 ENCOUNTER — Other Ambulatory Visit: Payer: Self-pay | Admitting: Diagnostic Neuroimaging

## 2016-02-29 ENCOUNTER — Other Ambulatory Visit: Payer: Self-pay | Admitting: Diagnostic Neuroimaging

## 2016-03-01 ENCOUNTER — Telehealth: Payer: Self-pay | Admitting: Diagnostic Neuroimaging

## 2016-03-01 NOTE — Telephone Encounter (Signed)
Received incoming call from Kirk Ruths who stated she was with patient this morning when he came in asking for medication refills. Advised her of refills available today at [facility], [place] , also gave her all information regarding Azilect. Advised she speak with Oley Balm to advise where pt wants to get Azilect refill. Advised she have patient call and schedule follow up in this office with Dr [name]. She verbalized understanding, appreciation.

## 2016-03-01 NOTE — Telephone Encounter (Signed)
LVM advising patient he needs to call back and schedule follow up. He was last seen in March 2017 and was to return for 6 week follow up.  Called patient's pharmacy, [facility], spoke with Langston. She stated patient has refills until March 2018 for carbidopa-levodopa and mirapex; she will refill today. She stated the patient transfered out the prescription for azilect. She requested the patient call her to let her know if he wants azilect transferred back to [facility] or if he will request refill from the other pharmacy.  Called patient back and left detailed VM informing him to call Dominique at [facility] re: where he wants to get refill of  Azilect. Gave him her phone number.  Also advised him Oley Balm is refilling the other two medications he requested, carbi-levo, pramipexole. Advised he call this office to schedule follow up with Dr [name], left number again.

## 2016-03-01 NOTE — Telephone Encounter (Signed)
Patient came in saying that he needs a refill on 3 of his medications.  Pramipexole 1 mg, Carbidopa 25-100 mg and Azilect 0.5 mg. The best number to contact patient is [phone]

## 2016-04-26 ENCOUNTER — Other Ambulatory Visit: Payer: Self-pay | Admitting: Internal Medicine

## 2016-04-26 DIAGNOSIS — R1030 Lower abdominal pain, unspecified: Secondary | ICD-10-CM

## 2016-04-27 ENCOUNTER — Ambulatory Visit
Admission: RE | Admit: 2016-04-27 | Discharge: 2016-04-27 | Disposition: A | Discharge status: Home / Self Care | Payer: Medicare HMO | Source: Ambulatory Visit | Attending: Internal Medicine | Admitting: Internal Medicine

## 2016-04-27 DIAGNOSIS — R1030 Lower abdominal pain, unspecified: Secondary | ICD-10-CM

## 2016-06-13 ENCOUNTER — Encounter (HOSPITAL_COMMUNITY): Payer: Self-pay | Admitting: Emergency Medicine

## 2016-06-13 ENCOUNTER — Emergency Department (HOSPITAL_COMMUNITY): Payer: Medicare HMO

## 2016-06-13 ENCOUNTER — Emergency Department (HOSPITAL_COMMUNITY)
Admission: EM | Admit: 2016-06-13 | Discharge: 2016-06-13 | Disposition: A | Discharge status: Home / Self Care | Payer: Medicare HMO | Attending: Emergency Medicine | Admitting: Emergency Medicine

## 2016-06-13 DIAGNOSIS — M4807 Spinal stenosis, lumbosacral region: Secondary | ICD-10-CM | POA: Diagnosis not present

## 2016-06-13 DIAGNOSIS — Z79899 Other long term (current) drug therapy: Secondary | ICD-10-CM | POA: Diagnosis not present

## 2016-06-13 DIAGNOSIS — Z7982 Long term (current) use of aspirin: Secondary | ICD-10-CM | POA: Insufficient documentation

## 2016-06-13 DIAGNOSIS — Z7984 Long term (current) use of oral hypoglycemic drugs: Secondary | ICD-10-CM | POA: Insufficient documentation

## 2016-06-13 DIAGNOSIS — M5440 Lumbago with sciatica, unspecified side: Secondary | ICD-10-CM | POA: Diagnosis not present

## 2016-06-13 DIAGNOSIS — Z87891 Personal history of nicotine dependence: Secondary | ICD-10-CM | POA: Insufficient documentation

## 2016-06-13 DIAGNOSIS — G20 Parkinson's disease: Secondary | ICD-10-CM | POA: Diagnosis not present

## 2016-06-13 DIAGNOSIS — M545 Low back pain: Secondary | ICD-10-CM | POA: Diagnosis present

## 2016-06-13 MED ORDER — NAPROXEN 375 MG PO TABS: 375.0000 mg | ORAL_TABLET | Freq: Two times a day (BID) | ORAL | 0 refills | Status: AC | PRN

## 2016-06-13 MED ORDER — HYDROMORPHONE HCL 1 MG/ML IJ SOLN
1.0000 mg | Freq: Once | INTRAMUSCULAR | Status: AC
  Administered 2016-06-13: 1 mg via INTRAMUSCULAR
  Filled 2016-06-13: qty 1

## 2016-06-13 MED ORDER — HYDROCODONE-ACETAMINOPHEN 5-325 MG PO TABS: 1.0000 | ORAL_TABLET | ORAL | 0 refills | Status: DC | PRN

## 2016-06-13 MED ORDER — KETOROLAC TROMETHAMINE 60 MG/2ML IM SOLN
60.0000 mg | Freq: Once | INTRAMUSCULAR | Status: AC
  Administered 2016-06-13: 60 mg via INTRAMUSCULAR
  Filled 2016-06-13: qty 2

## 2016-06-13 MED ORDER — PREDNISONE 20 MG PO TABS: 40.0000 mg | ORAL_TABLET | Freq: Every day | ORAL | 0 refills | Status: AC

## 2016-06-13 NOTE — ED Triage Notes (Signed)
Patient c/o lower back pain x 2 weeks. Patient taking OTC and Dr Mancel Bale prescribed him something for the pain but not working. Patient states that pain is worse with movement.

## 2016-06-13 NOTE — ED Provider Notes (Signed)
[facility] DEPT Provider Note   CSN: [id number] Arrival date & time: 06/13/16  [id number]     History   Chief Complaint Chief Complaint  Patient presents with  . Back Pain    HPI Theodore King is a 71 y.o. male.  HPI   71 yo M with PMHx as below here with back pain x several weeks. Pt states he was bending down several weeks ago when he developed an aching, throbbing, cramping b/l lower back and paraspinal pain. Pain made worse with movement, palpation, and lifting. Pan is alleviated by leaning forward and lying flat. No direct trauma. No fever, chills, weight loss, or night sweats. No loss of bowel or bladder function. No hematuria, urinary frequency. No abdominal pain.  Past Medical History:  Diagnosis Date  . Disorders of bursae and tendons in shoulder region, unspecified   . Other and unspecified hyperlipidemia   . Other and unspecified hyperlipidemia   . 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 Parkinson's disease ([place])   . Unspecified vitamin D deficiency   . Unspecified vitamin D deficiency     Patient Active Problem List   Diagnosis Date Noted  . Abnormal dreams 09/06/2013  . 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 Hyperglycemia 05/18/2013  . Paralysis agitans ([place]) 05/18/2013  . Unspecified vitamin D deficiency 05/18/2013  . Hyperlipidemia LDL goal < 100 05/18/2013  . Parkinson's disease ([place]) 10/26/2012    Past Surgical History:  Procedure Laterality Date  . APPENDECTOMY    . ROTATOR CUFF REPAIR     right       Home Medications    Prior to Admission medications   Medication Sig Start Date End Date Taking? Authorizing Provider  albuterol (PROVENTIL HFA;VENTOLIN HFA) 108 (90 BASE) MCG/ACT inhaler Inhale 2 puffs into the lungs every 6 (six) hours as needed for wheezing or shortness of breath. 07/04/13  Yes Lauree Chandler, NP  aspirin 325 MG tablet Take 325 mg by mouth daily.   Yes Historical Provider, MD  carbidopa-levodopa (SINEMET  IR) 25-100 MG tablet Take 2 tablets by mouth 3 (three) times daily. 08/18/15  Yes Penni Bombard, MD  Cholecalciferol (VITAMIN D PO) Take 1,000 Units by mouth daily.    Yes Historical Provider, MD  diazepam (VALIUM) 5 MG tablet Take 5 mg by mouth 2 (two) times daily as needed for anxiety.  06/25/15  Yes Historical Provider, MD  ibuprofen (ADVIL,MOTRIN) 200 MG tablet Take 200 mg by mouth every 6 (six) hours as needed.   Yes Historical Provider, MD  meclizine (ANTIVERT) 25 MG tablet Take 25 mg by mouth daily. 05/11/16  Yes Historical Provider, MD  metFORMIN (GLUCOPHAGE) 500 MG tablet Take 500 mg by mouth daily. 03/24/16  Yes Historical Provider, MD  oxyCODONE-acetaminophen (PERCOCET/ROXICET) 5-325 MG tablet Take 1 tablet by mouth every 6 (six) hours as needed. pain 08/19/15  Yes Historical Provider, MD  pramipexole (MIRAPEX) 1 MG tablet Take 2 tablets (2 mg total) by mouth 2 (two) times daily. 08/18/15  Yes Penni Bombard, MD  rasagiline (AZILECT) 0.5 MG TABS tablet Take 1 tablet (0.5 mg total) by mouth daily. 08/18/15  Yes Penni Bombard, MD  thiamine (VITAMIN B-1) 100 MG tablet Take 100 mg by mouth daily.   Yes Historical Provider, MD  HYDROcodone-acetaminophen (NORCO/VICODIN) 5-325 MG tablet Take 1-2 tablets by mouth every 4 (four) hours as needed for severe pain. 06/13/16   Duffy Bruce, MD  naproxen (NAPROSYN) 375 MG tablet Take 1 tablet (  375 mg total) by mouth 2 (two) times daily as needed for moderate pain. 06/13/16 06/20/16  Duffy Bruce, MD  predniSONE (DELTASONE) 20 MG tablet Take 2 tablets (40 mg total) by mouth daily. 06/13/16 06/18/16  Duffy Bruce, MD    Family History Family History  Problem Relation Age of Onset  . Cancer Mother   . Cancer Father   . Stroke Brother   . Cancer Sister   . Cancer Sister   . Colon cancer Neg Hx   . Esophageal cancer Neg Hx   . Rectal cancer Neg Hx   . Stomach cancer Neg Hx     Social History Social History  Substance Use Topics  .  Smoking status: Former Research scientist (life sciences)  . Smokeless tobacco: Never Used  . Alcohol use Yes     Comment: occasionally ("a sip every now and then")     Allergies   Patient has no known allergies.   Review of Systems Review of Systems  Constitutional: Negative for chills, fatigue and fever.  HENT: Negative for congestion and rhinorrhea.   Eyes: Negative for visual disturbance.  Respiratory: Negative for cough, shortness of breath and wheezing.   Cardiovascular: Negative for chest pain and leg swelling.  Gastrointestinal: Negative for abdominal pain, diarrhea, nausea and vomiting.  Genitourinary: Negative for dysuria and flank pain.  Musculoskeletal: Positive for back pain. Negative for neck pain and neck stiffness.  Skin: Negative for rash and wound.  Allergic/Immunologic: Negative for immunocompromised state.  Neurological: Negative for syncope, weakness and headaches.  All other systems reviewed and are negative.    Physical Exam Updated Vital Signs BP 145/74 (BP Location: Left Arm)   Pulse (!) 56   Temp 97.4 F (36.3 C) (Oral)   Resp 15   Ht 5' 11.5" (1.816 m)   Wt 245 lb (111.1 kg)   SpO2 100%   BMI 33.69 kg/m   Physical Exam  Constitutional: He is oriented to person, place, and time. He appears well-developed and well-nourished. No distress.  HENT:  Head: Normocephalic and atraumatic.  Eyes: Conjunctivae are normal.  Neck: Neck supple.  Cardiovascular: Normal rate, regular rhythm and normal heart sounds.   Pulmonary/Chest: Effort normal. No respiratory distress. He has no wheezes.  Abdominal: He exhibits no distension.  Musculoskeletal: He exhibits no edema.  Neurological: He is alert and oriented to person, place, and time. He exhibits normal muscle tone.  Skin: Skin is warm. Capillary refill takes less than 2 seconds. No rash noted.  Nursing note and vitals reviewed.   Spine Exam: Inspection/Palpation: Bilateral moderate TTP along lower lumbar spine, with no  erythema, midline stepoffs/deformity, or other abnormalities. Strength: 5/5 throughout LE bilaterally (hip flexion/extension, adduction/abduction; knee flexion/extension; foot dorsiflexion/plantarflexion, inversion/eversion; great toe inversion) Sensation: Intact to light touch in proximal and distal LE bilaterally Reflexes: 2+ quadriceps and achilles reflexes  ED Treatments / Results  Labs (all labs ordered are listed, but only abnormal results are displayed) Labs Reviewed - No data to display  EKG  EKG Interpretation None       Radiology Dg Lumbar Spine Complete  Result Date: 06/13/2016 CLINICAL DATA:  Lower back pain for 2 weeks EXAM: LUMBAR SPINE - COMPLETE 4+ VIEW COMPARISON:  08/24/2015 FINDINGS: No acute fracture, endplate erosion, or evidence of focal bone lesion. Transitional lumbosacral anatomy with rudimentary L5-S1 disc based on the lowest visible ribs. Advanced L3-4 and L4-5 disc narrowing and endplate ridging. Facet arthropathy with spurring from L2-3 to L5-S1, greater inferiorly. Extensive atherosclerotic calcification of  the aorta and branch vessels. IMPRESSION: 1. Transitional lumbosacral anatomy based on the lowest ribs, with rudimentary L5-S1 disc. This numbering differs from MRI numbering 09/09/2014. 2. No acute finding. 3. Advanced disc degeneration at L3-4 and L4-5. Lower lumbar facet arthropathy. Electronically Signed   By: Monte Fantasia M.D.   On: 06/13/2016 10:56    Procedures Procedures (including critical care time)  Medications Ordered in ED Medications  HYDROmorphone (DILAUDID) injection 1 mg (1 mg Intramuscular Given 06/13/16 1029)  ketorolac (TORADOL) injection 60 mg (60 mg Intramuscular Given 06/13/16 1030)    EMERGENCY DEPARTMENT [place] ABD/AORTA EXAM Study: Limited Ultrasound of the Abdominal Aorta.  INDICATIONS:Back pain and Age>55 Indication: Multiple views of the abdominal aorta are obtained from the diaphragmatic hiatus to the aortic bifurcation in  transverse and sagittal planes with a multi- Frequency probe.  PERFORMED BY: Myself  IMAGES ARCHIVED?: Yes  FINDINGS: Maximum aortic dimensions are <3 x 3 cm  LIMITATIONS:  Body habitus and Bowel gas  INTERPRETATION:  No abdominal aortic aneurysm  COMMENT:  Exam somewhat limited 2/2 bowel gas as well as abdominal scarring from previous laparotomy.  Initial Impression / Assessment and Plan / ED Course  I have reviewed the triage vital signs and the nursing notes.  Pertinent labs & imaging results that were available during my care of the patient were reviewed by me and considered in my medical decision making (see chart for details).  Clinical Course     71 yo M with PMHx as above here with positional, reproducible midline and paraspinal LBP. History, exam as above and is most c/w likely mild spinal stenosis versus lumbar radiculopathy. Paraspinal strain also on ddx. Pt has no leg weakness/numbness, loss of bowel or bladder fxn, or signs of cauda equina or significant radiculopathy. No fever, chills, or signs of epidural abscess or osteo. Plain films without significant abnormality. BSUS shows no AAA as possible referred etiology of pain. No urinary symptoms. Will treat with brief course of analgesia, advise PT/OT and PCP f/u.  Final Clinical Impressions(s) / ED Diagnoses   Final diagnoses:  Spinal stenosis of lumbosacral region  Acute bilateral low back pain with sciatica, sciatica laterality unspecified    New Prescriptions Discharge Medication List as of 06/13/2016  1:06 PM    START taking these medications   Details  HYDROcodone-acetaminophen (NORCO/VICODIN) 5-325 MG tablet Take 1-2 tablets by mouth every 4 (four) hours as needed for severe pain., Starting Sun 06/13/2016, Print    naproxen (NAPROSYN) 375 MG tablet Take 1 tablet (375 mg total) by mouth 2 (two) times daily as needed for moderate pain., Starting Sun 06/13/2016, Until Sun 06/20/2016, Print         Duffy Bruce,  MD 06/14/16 0700

## 2016-09-05 ENCOUNTER — Emergency Department (HOSPITAL_COMMUNITY)
Admission: EM | Admit: 2016-09-05 | Discharge: 2016-09-05 | Disposition: A | Discharge status: Home / Self Care | Payer: Medicare HMO | Attending: Emergency Medicine | Admitting: Emergency Medicine

## 2016-09-05 ENCOUNTER — Encounter (HOSPITAL_COMMUNITY): Payer: Self-pay

## 2016-09-05 DIAGNOSIS — G20 Parkinson's disease: Secondary | ICD-10-CM | POA: Diagnosis not present

## 2016-09-05 DIAGNOSIS — Z87891 Personal history of nicotine dependence: Secondary | ICD-10-CM | POA: Insufficient documentation

## 2016-09-05 DIAGNOSIS — Z79899 Other long term (current) drug therapy: Secondary | ICD-10-CM | POA: Diagnosis not present

## 2016-09-05 DIAGNOSIS — Z7982 Long term (current) use of aspirin: Secondary | ICD-10-CM | POA: Insufficient documentation

## 2016-09-05 DIAGNOSIS — M79604 Pain in right leg: Secondary | ICD-10-CM | POA: Diagnosis present

## 2016-09-05 DIAGNOSIS — M5410 Radiculopathy, site unspecified: Secondary | ICD-10-CM

## 2016-09-05 MED ORDER — HYDROCODONE-ACETAMINOPHEN 5-325 MG PO TABS: 1.0000 | ORAL_TABLET | ORAL | 0 refills | Status: DC | PRN

## 2016-09-05 NOTE — ED Provider Notes (Signed)
[facility] DEPT Provider Note   CSN: [id number] Arrival date & time: 09/05/16  1337     History   Chief Complaint Chief Complaint  Patient presents with  . Leg Pain  . Leg Swelling    HPI Theodore King is a 71 y.o. male man who presents to the emergency department with chief complaint of right leg pain. Patient has a past medical history of spinal stenosis, Parkinson's disease, hyperglycemia and hyperlipidemia. History is limited secondary to patient's insight and patient is a poor historian. Apparently this is a chronic problem. Patient states that he was "on some kind of medicine" given by his primary care doctor that was helpful but he is unsure what it was. Patient went to [facility] services this morning but states he came immediately to the emergency department because of the pain in his leg. He describes "stinging" pain in the right frontal thigh. He cannot think of any movements or positions that make it worse. The patient also has chronic swelling in the right knee which is old. Patient is able to ambulate he did not take anything for the pain prior to arrival. Review of the MR shows previous history of lumbar pain and previous MRI that showed bulging disc and foraminal stenosis at L4-5 and L5-S1. Patient denies weakness, seizure. He denies urinary symptoms.       Leg Pain      Past Medical History:  Diagnosis Date  . Disorders of bursae and tendons in shoulder region, unspecified   . Other and unspecified hyperlipidemia   . Other and unspecified hyperlipidemia   . 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 Parkinson's disease ([place])   . Unspecified vitamin D deficiency   . Unspecified vitamin D deficiency     Patient Active Problem List   Diagnosis Date Noted  . Abnormal dreams 09/06/2013  . 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 Hyperglycemia 05/18/2013  . Paralysis agitans ([place]) 05/18/2013  . Unspecified vitamin D deficiency 05/18/2013  .  Hyperlipidemia LDL goal < 100 05/18/2013  . Parkinson's disease ([place]) 10/26/2012    Past Surgical History:  Procedure Laterality Date  . APPENDECTOMY    . ROTATOR CUFF REPAIR     right       Home Medications    Prior to Admission medications   Medication Sig Start Date End Date Taking? Authorizing Provider  albuterol (PROVENTIL HFA;VENTOLIN HFA) 108 (90 BASE) MCG/ACT inhaler Inhale 2 puffs into the lungs every 6 (six) hours as needed for wheezing or shortness of breath. 07/04/13   Lauree Chandler, NP  aspirin 325 MG tablet Take 325 mg by mouth daily.    Historical Provider, MD  carbidopa-levodopa (SINEMET IR) 25-100 MG tablet Take 2 tablets by mouth 3 (three) times daily. 08/18/15   Penni Bombard, MD  Cholecalciferol (VITAMIN D PO) Take 1,000 Units by mouth daily.     Historical Provider, MD  diazepam (VALIUM) 5 MG tablet Take 5 mg by mouth 2 (two) times daily as needed for anxiety.  06/25/15   Historical Provider, MD  HYDROcodone-acetaminophen (NORCO/VICODIN) 5-325 MG tablet Take 1-2 tablets by mouth every 4 (four) hours as needed for severe pain. 06/13/16   Duffy Bruce, MD  ibuprofen (ADVIL,MOTRIN) 200 MG tablet Take 200 mg by mouth every 6 (six) hours as needed.    Historical Provider, MD  meclizine (ANTIVERT) 25 MG tablet Take 25 mg by mouth daily. 05/11/16   Historical Provider, MD  metFORMIN (GLUCOPHAGE) 500 MG tablet  Take 500 mg by mouth daily. 03/24/16   Historical Provider, MD  oxyCODONE-acetaminophen (PERCOCET/ROXICET) 5-325 MG tablet Take 1 tablet by mouth every 6 (six) hours as needed. pain 08/19/15   Historical Provider, MD  pramipexole (MIRAPEX) 1 MG tablet Take 2 tablets (2 mg total) by mouth 2 (two) times daily. 08/18/15   Penni Bombard, MD  rasagiline (AZILECT) 0.5 MG TABS tablet Take 1 tablet (0.5 mg total) by mouth daily. 08/18/15   Penni Bombard, MD  thiamine (VITAMIN B-1) 100 MG tablet Take 100 mg by mouth daily.    Historical Provider, MD    Family  History Family History  Problem Relation Age of Onset  . Cancer Mother   . Cancer Father   . Stroke Brother   . Cancer Sister   . Cancer Sister   . Colon cancer Neg Hx   . Esophageal cancer Neg Hx   . Rectal cancer Neg Hx   . Stomach cancer Neg Hx     Social History Social History  Substance Use Topics  . Smoking status: Former Research scientist (life sciences)  . Smokeless tobacco: Never Used  . Alcohol use Yes     Comment: occasionally ("a sip every now and then")     Allergies   Patient has no known allergies.   Review of Systems Review of Systems   Physical Exam Updated Vital Signs BP (!) 145/71 (BP Location: Left Arm)   Pulse 68   Temp 97.7 F (36.5 C) (Oral)   Resp 17   Ht 6' (1.829 m)   Wt 108.9 kg   SpO2 95%   BMI 32.55 kg/m   Physical Exam  Constitutional: He appears well-developed and well-nourished. No distress.  HENT:  Head: Normocephalic and atraumatic.  Eyes: Conjunctivae are normal. No scleral icterus.  Neck: Normal range of motion. Neck supple.  Cardiovascular: Normal rate, regular rhythm, normal heart sounds and intact distal pulses.   Tabs are equal and sore conference bilaterally, bilateral pitting edema noted in the feet however this is equal bilaterally. Strong DP and PT pulses palpable.  Pulmonary/Chest: Effort normal and breath sounds normal. No respiratory distress.  Abdominal: Soft. There is no tenderness.  Musculoskeletal: Normal range of motion. He exhibits no edema.  Full passive range of motion of the right hip without pain, normal strength. Full active range of motion of the right knee without pain. Mild effusion is present.    Neurological: He is alert.  Tremors, bradykinesia   Normal strength bilaterally with hip flexion and extension abduction and abduction. Plantar and dorsiflexion is normal bilaterally patient has a shuffling gait consistent with parkinsonism  Skin: Skin is warm and dry. He is not diaphoretic.  Psychiatric: His behavior is  normal.  Nursing note and vitals reviewed.   Ten systems reviewed and are negative for acute change, except as noted in the HPI.   ED Treatments / Results  Labs (all labs ordered are listed, but only abnormal results are displayed) Labs Reviewed - No data to display  EKG  EKG Interpretation None       Radiology No results found.  Procedures Procedures (including critical care time)  Medications Ordered in ED Medications - No data to display   Initial Impression / Assessment and Plan / ED Course  I have reviewed the triage vital signs and the nursing notes.  Pertinent labs & imaging results that were available during my care of the patient were reviewed by me and considered in my medical decision making (  see chart for details).    The patient was seen in service with Dr. Bobby Rumpf. Patient has no red flag symptoms. There is no evidence of vascular disease or blood clot in that leg. He has a chronic right knee effusion. The patient's girlfriend is present and gives added history stating that this has been present since January of this year and he has a history of chronic back pain. Given his MRI results this is most likely radiculopathy of the right leg. I see no evidence of rashes suggestive of new zoster infection. I discussed reasons to seek immediate medical care and have asked the patient to keep an eye on the area for any lesion development. He is to follow-up with his PCP for further management and evaluation. We'll give the patient a prescription for hydrocodone for pain management at this time.  Final Clinical Impressions(s) / ED Diagnoses   Final diagnoses:  Radiculopathy of leg    New Prescriptions New Prescriptions   No medications on file     Margarita Mail, PA-C 09/05/16 [place], MD 09/05/16 [phone]

## 2016-09-05 NOTE — ED Triage Notes (Signed)
Patient c/o right thigh pain and states right leg "feels tighter" x 1 week. Patient states OTC pain meds with no relief.

## 2016-09-05 NOTE — ED Provider Notes (Addendum)
Medical screening examination/treatment/procedure(s) were conducted as a shared visit with non-physician practitioner(s) and myself.  I personally evaluated the patient during the encounter.   EKG Interpretation None        The patient seen by me along with a a physician assistant. Patient with several week to months complaint of right anterior thigh pain. No history of fall. Patient's wife states that he was started on pain medication for this by his primary care doctor back in January. That this complaint is not a new complaint.  On exam right thigh without any muscle spasm no significant swelling. No redness. Good range of motion at the knee and hip without pain. No sensory deficits distally to the right foot. No sensory deficits to the right thigh.  Capillary refill to the right foot is less than 2 seconds.  The patient does have a history of Parkinson's. But this should not cause pain is probably responsible for his unstable gait.  Patient will be treated with pain medicine and follow-up with primary care doctor they can decide whether they want to do an MRI.  Patient symptoms may very well be a femoral nerve radiculopathy.   Fredia Sorrow, MD 09/05/16 [place], MD 09/05/16 1705

## 2016-09-05 NOTE — Discharge Instructions (Signed)
Contact a health care provider if: °Your pain and other symptoms get worse. °Your pain medicine is not helping. °Your pain has not improved after a few weeks of home care. °You have a fever. °Get help right away if: °You have severe pain, weakness, or numbness. °You have difficulty with bladder or bowel control. °

## 2016-09-09 ENCOUNTER — Telehealth: Payer: Self-pay | Admitting: Diagnostic Neuroimaging

## 2016-09-09 NOTE — Telephone Encounter (Signed)
pls inform pt of policy. Ok to allow patient 1 more trial to come in and see me or NP. -VRP

## 2016-09-09 NOTE — Telephone Encounter (Signed)
Patient has 3 no shows for 2017 are we able to schedule an appointment.

## 2016-09-09 NOTE — Telephone Encounter (Addendum)
This pt has a balance, $200.00 per Elson Clan in billing.  3 no shows then one left.  Give one more try? Or dismiss, these were back in 09/30/15

## 2016-09-15 ENCOUNTER — Encounter (HOSPITAL_COMMUNITY): Payer: Self-pay | Admitting: *Deleted

## 2016-09-15 ENCOUNTER — Emergency Department (HOSPITAL_COMMUNITY)
Admission: EM | Admit: 2016-09-15 | Discharge: 2016-09-15 | Disposition: A | Discharge status: Home / Self Care | Payer: Medicare HMO | Attending: Emergency Medicine | Admitting: Emergency Medicine

## 2016-09-15 DIAGNOSIS — Y929 Unspecified place or not applicable: Secondary | ICD-10-CM | POA: Diagnosis not present

## 2016-09-15 DIAGNOSIS — Z87891 Personal history of nicotine dependence: Secondary | ICD-10-CM | POA: Insufficient documentation

## 2016-09-15 DIAGNOSIS — G20 Parkinson's disease: Secondary | ICD-10-CM | POA: Insufficient documentation

## 2016-09-15 DIAGNOSIS — Y9389 Activity, other specified: Secondary | ICD-10-CM | POA: Diagnosis not present

## 2016-09-15 DIAGNOSIS — M62838 Other muscle spasm: Secondary | ICD-10-CM | POA: Diagnosis not present

## 2016-09-15 DIAGNOSIS — X509XXA Other and unspecified overexertion or strenuous movements or postures, initial encounter: Secondary | ICD-10-CM | POA: Diagnosis not present

## 2016-09-15 DIAGNOSIS — Y999 Unspecified external cause status: Secondary | ICD-10-CM | POA: Insufficient documentation

## 2016-09-15 LAB — I-STAT CHEM 8, ED
BUN: 13 mg/dL (ref 6–20)
Calcium, Ion: 1.22 mmol/L (ref 1.15–1.40)
Chloride: 104 mmol/L (ref 101–111)
Creatinine, Ser: 1.1 mg/dL (ref 0.61–1.24)
Glucose, Bld: 64 mg/dL — ABNORMAL LOW (ref 65–99)
HCT: 38 % — ABNORMAL LOW (ref 39.0–52.0)
Hemoglobin: 12.9 g/dL — ABNORMAL LOW (ref 13.0–17.0)
Potassium: 4 mmol/L (ref 3.5–5.1)
Sodium: 143 mmol/L (ref 135–145)
TCO2: 30 mmol/L (ref 0–100)

## 2016-09-15 MED ORDER — METHOCARBAMOL 500 MG PO TABS: 500.0000 mg | ORAL_TABLET | Freq: Every day | ORAL | 0 refills | Status: AC

## 2016-09-15 NOTE — ED Notes (Signed)
Bed: WA09 Expected date:  Expected time:  Means of arrival:  Comments: No bed or monitor

## 2016-09-15 NOTE — ED Triage Notes (Addendum)
Per EMS, pt complains of muscle spasms. Pt was playing horse shoes at the time spasms occured. Pt complains of muscle spasms bilaterally in legs. Pt was seen 4 days ago for same. Pt has hx of Parkinson's. Pt spasms went away en route to hospital.

## 2016-09-15 NOTE — ED Provider Notes (Signed)
[facility] DEPT Provider Note   CSN: [id number] Arrival date & time: 09/15/16  1420     History   Chief Complaint Chief Complaint  Patient presents with  . Spasms    HPI Theodore King is a 71 y.o. male.  The history is provided by the patient.   CC: bilateral leg cramps  Onset/Duration: approx 1 month Timing: intermittent Location: bilateral medial thigh Quality: cramps Severity: moderate Modifying Factors:  Improved by: self resolving  Worsened by: nothing Associated Signs/Symptoms:  Pertinent (+): at times slowed to initiate ambulation in the morning; this resolves after ambulating for a while and warming up muscles.  Pertinent (-): fall, back pain, bowel or bladder incontinence Context: h/o Parkinson's   Past Medical History:  Diagnosis Date  . Disorders of bursae and tendons in shoulder region, unspecified   . Other and unspecified hyperlipidemia   . Other and unspecified hyperlipidemia   . 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 Parkinson's disease ([place])   . Unspecified vitamin D deficiency   . Unspecified vitamin D deficiency     Patient Active Problem List   Diagnosis Date Noted  . Abnormal dreams 09/06/2013  . 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 Hyperglycemia 05/18/2013  . Paralysis agitans ([place]) 05/18/2013  . Unspecified vitamin D deficiency 05/18/2013  . Hyperlipidemia LDL goal < 100 05/18/2013  . Parkinson's disease ([place]) 10/26/2012    Past Surgical History:  Procedure Laterality Date  . APPENDECTOMY    . ROTATOR CUFF REPAIR     right       Home Medications    Prior to Admission medications   Medication Sig Start Date End Date Taking? Authorizing Provider  albuterol (PROVENTIL HFA;VENTOLIN HFA) 108 (90 BASE) MCG/ACT inhaler Inhale 2 puffs into the lungs every 6 (six) hours as needed for wheezing or shortness of breath. 07/04/13  Yes Lauree Chandler, NP  carbidopa-levodopa (SINEMET IR) 25-100 MG  tablet Take 2 tablets by mouth 3 (three) times daily. 08/18/15  Yes Penni Bombard, MD  Cholecalciferol (VITAMIN D PO) Take 1,000 Units by mouth daily.    Yes Historical Provider, MD  HYDROcodone-acetaminophen (NORCO/VICODIN) 5-325 MG tablet Take 1-2 tablets by mouth every 4 (four) hours as needed for severe pain. 09/05/16  Yes Margarita Mail, PA-C  ibuprofen (ADVIL,MOTRIN) 200 MG tablet Take 200 mg by mouth every 6 (six) hours as needed for headache, mild pain or moderate pain.    Yes Historical Provider, MD  meclizine (ANTIVERT) 25 MG tablet Take 25 mg by mouth daily. 05/11/16  Yes Historical Provider, MD  oxyCODONE-acetaminophen (PERCOCET/ROXICET) 5-325 MG tablet Take 1 tablet by mouth every 6 (six) hours as needed for moderate pain or severe pain. pain 08/19/15  Yes Historical Provider, MD  pramipexole (MIRAPEX) 1 MG tablet Take 2 tablets (2 mg total) by mouth 2 (two) times daily. 08/18/15  Yes Penni Bombard, MD  methocarbamol (ROBAXIN) 500 MG tablet Take 1 tablet (500 mg total) by mouth at bedtime. 09/15/16 09/29/16  Fatima Blank, MD  rasagiline (AZILECT) 0.5 MG TABS tablet Take 1 tablet (0.5 mg total) by mouth daily. Patient not taking: Reported on 09/05/2016 08/18/15   Penni Bombard, MD    Family History Family History  Problem Relation Age of Onset  . Cancer Mother   . Cancer Father   . Stroke Brother   . Cancer Sister   . Cancer Sister   . Colon cancer Neg Hx   . Esophageal cancer Neg  Hx   . Rectal cancer Neg Hx   . Stomach cancer Neg Hx     Social History Social History  Substance Use Topics  . Smoking status: Former Research scientist (life sciences)  . Smokeless tobacco: Never Used  . Alcohol use Yes     Comment: occasionally ("a sip every now and then")     Allergies   Patient has no known allergies.   Review of Systems Review of Systems All other systems are reviewed and are negative for acute change except as noted in the HPI   Physical Exam Updated Vital Signs BP 137/62  (BP Location: Right Arm)   Pulse 62   Temp 97.6 F (36.4 C) (Oral)   Resp 16   Ht 5' 11.5" (1.816 m)   Wt 230 lb (104.3 kg)   SpO2 99%   BMI 31.63 kg/m   Physical Exam  Constitutional: He is oriented to person, place, and time. He appears well-developed and well-nourished. No distress.  HENT:  Head: Normocephalic and atraumatic.  Nose: Nose normal.  Eyes: Conjunctivae and EOM are normal. Pupils are equal, round, and reactive to light. Right eye exhibits no discharge. Left eye exhibits no discharge. No scleral icterus.  Neck: Normal range of motion. Neck supple.  Cardiovascular: Normal rate and regular rhythm.  Exam reveals no gallop and no friction rub.   No murmur heard. Pulmonary/Chest: Effort normal and breath sounds normal. No stridor. No respiratory distress. He has no rales.  Abdominal: Soft. He exhibits no distension. There is no tenderness.  Musculoskeletal: He exhibits no edema or tenderness.  Neurological: He is alert and oriented to person, place, and time.   Strength: 5/5 throughout LE bilaterally (hip flexion/extension, adduction/abduction; knee flexion/extension; foot dorsiflexion/plantarflexion, inversion/eversion; great toe inversion) Sensation: Intact to light touch in proximal and distal LE bilaterally Reflexes: 1+ quadriceps and achilles reflexes    Skin: Skin is warm and dry. No rash noted. He is not diaphoretic. No erythema.  Psychiatric: He has a normal mood and affect.  Vitals reviewed.    ED Treatments / Results  Labs (all labs ordered are listed, but only abnormal results are displayed) Labs Reviewed  I-STAT CHEM 8, ED - Abnormal; Notable for the following:       Result Value   Glucose, Bld 64 (*)    Hemoglobin 12.9 (*)    HCT 38.0 (*)    All other components within normal limits    EKG  EKG Interpretation None       Radiology No results found.  Procedures Procedures (including critical care time)  Medications Ordered in  ED Medications - No data to display   Initial Impression / Assessment and Plan / ED Course  I have reviewed the triage vital signs and the nursing notes.  Pertinent labs & imaging results that were available during my care of the patient were reviewed by me and considered in my medical decision making (see chart for details).     No suspicion for cauda equina. Possible parkinson's recurrence after surgical intervention. Possible polymyalgia rheumatica. Will assess potassium and calcium for possible derangement.  Calcium and potassium within normal limits. He is currently asymptomatic at this time.  The patient is safe for discharge with strict return precautions.   Final Clinical Impressions(s) / ED Diagnoses   Final diagnoses:  Muscle spasm   Disposition: Discharge  Condition: Good  I have discussed the results, Dx and Tx plan with the patient who expressed understanding and agree(s) with the plan. Discharge  instructions discussed at great length. The patient was given strict return precautions who verbalized understanding of the instructions. No further questions at time of discharge.    New Prescriptions   METHOCARBAMOL (ROBAXIN) 500 MG TABLET    Take 1 tablet (500 mg total) by mouth at bedtime.    Follow Up: [name], MD [address] [place]  [id number] [phone]  Schedule an appointment as soon as possible for a visit  in 5-7 days, If symptoms do not improve or  worsen      Fatima Blank, MD 09/15/16 [phone]

## 2016-10-01 ENCOUNTER — Telehealth: Payer: Self-pay | Admitting: *Deleted

## 2016-10-01 NOTE — Telephone Encounter (Signed)
Needs refill of azilect.  I attempted to call pt - busy. Will try later.

## 2016-10-04 ENCOUNTER — Other Ambulatory Visit: Payer: Self-pay | Admitting: Diagnostic Neuroimaging

## 2016-10-04 NOTE — Telephone Encounter (Signed)
Attempted to call and busy, home/mobile #.

## 2016-10-06 ENCOUNTER — Other Ambulatory Visit: Payer: Self-pay | Admitting: Diagnostic Neuroimaging

## 2016-10-07 NOTE — Telephone Encounter (Signed)
Attempted to call pt from his cell/home # and busy concerning refill x 3.

## 2016-10-15 ENCOUNTER — Encounter (HOSPITAL_COMMUNITY): Payer: Self-pay

## 2016-10-15 ENCOUNTER — Emergency Department (HOSPITAL_COMMUNITY)
Admission: EM | Admit: 2016-10-15 | Discharge: 2016-10-15 | Disposition: A | Discharge status: Home / Self Care | Payer: Medicare HMO | Attending: Emergency Medicine | Admitting: Emergency Medicine

## 2016-10-15 DIAGNOSIS — Z87891 Personal history of nicotine dependence: Secondary | ICD-10-CM | POA: Insufficient documentation

## 2016-10-15 DIAGNOSIS — M79605 Pain in left leg: Secondary | ICD-10-CM | POA: Diagnosis not present

## 2016-10-15 DIAGNOSIS — M4800 Spinal stenosis, site unspecified: Secondary | ICD-10-CM | POA: Diagnosis not present

## 2016-10-15 DIAGNOSIS — G20 Parkinson's disease: Secondary | ICD-10-CM | POA: Diagnosis not present

## 2016-10-15 DIAGNOSIS — M79604 Pain in right leg: Secondary | ICD-10-CM | POA: Insufficient documentation

## 2016-10-15 DIAGNOSIS — Z79899 Other long term (current) drug therapy: Secondary | ICD-10-CM | POA: Insufficient documentation

## 2016-10-15 LAB — CBC
HCT: 40.3 % (ref 39.0–52.0)
Hemoglobin: 13.4 g/dL (ref 13.0–17.0)
MCH: 27.3 pg (ref 26.0–34.0)
MCHC: 33.3 g/dL (ref 30.0–36.0)
MCV: 82.2 fL (ref 78.0–100.0)
Platelets: 168 10*3/uL (ref 150–400)
RBC: 4.9 MIL/uL (ref 4.22–5.81)
RDW: 13.3 % (ref 11.5–15.5)
WBC: 6.9 10*3/uL (ref 4.0–10.5)

## 2016-10-15 LAB — BASIC METABOLIC PANEL
Anion gap: 9 (ref 5–15)
BUN: 17 mg/dL (ref 6–20)
CO2: 23 mmol/L (ref 22–32)
Calcium: 9.3 mg/dL (ref 8.9–10.3)
Chloride: 108 mmol/L (ref 101–111)
Creatinine, Ser: 1.13 mg/dL (ref 0.61–1.24)
GFR calc Af Amer: >60 mL/min (ref >60)
GFR calc non Af Amer: >60 mL/min (ref >60)
Glucose, Bld: 156 mg/dL — ABNORMAL HIGH (ref 65–99)
Potassium: 3.8 mmol/L (ref 3.5–5.1)
Sodium: 140 mmol/L (ref 135–145)

## 2016-10-15 MED ORDER — KETOROLAC TROMETHAMINE 30 MG/ML IJ SOLN
30.0000 mg | Freq: Once | INTRAMUSCULAR | Status: AC
  Administered 2016-10-15: 30 mg via INTRAMUSCULAR
  Filled 2016-10-15: qty 1

## 2016-10-15 MED ORDER — IBUPROFEN 400 MG PO TABS: 600.0000 mg | ORAL_TABLET | Freq: Four times a day (QID) | ORAL | 0 refills | Status: DC | PRN

## 2016-10-15 NOTE — ED Notes (Signed)
Pt c/o acute on chronic leg pain, R worse than left. Pt states pain worse last 5-6 days. Pt states he has been taking medication as prescribed. Pt was advised to have outpatient MRI for spinal stenosis but has not had follow up.

## 2016-10-15 NOTE — ED Provider Notes (Signed)
[facility] DEPT Provider Note   CSN: [id number] Arrival date & time: 10/15/16  1257  By signing my name below, I, Evelene Croon, attest that this documentation has been prepared under the direction and in the presence of Eliezer Mccoy, PA-C. Electronically Signed: Evelene Croon, Scribe. 10/15/2016. 4:04 PM.  History   Chief Complaint Chief Complaint  Patient presents with  . Leg Pain   The history is provided by the patient. No language interpreter was used.   Subjective:  Theodore King is a 71 y.o. male with a history of Parkinson's disease, who presents to the Emergency Department complaining of chronic BLE  pain x ~ 1 year. His pain has worsened in the last 3-4 months, states it was the worse its been this AM. He notes his pain is often exacerated after he exercises. It is also worse with standing up. He takes percocet for his pain with mild relief. He reports associated swelling to the legs, back pain, and intermittent tingling in the extremities. He has been evaluated in the past for the same and was advised to follow up and get an outpatient MRI which he has not done. He denies bowel/bladder incontinence or saddle anesthesia. No other acute complaints at this time. Patient denies any chest pain, shortness of breath, abdominal pain, nausea, vomiting, recent travel or injury, urinary symptoms.   Past Medical History:  Diagnosis Date  . Disorders of bursae and tendons in shoulder region, unspecified   . Other and unspecified hyperlipidemia   . Other and unspecified hyperlipidemia   . 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 Parkinson's disease ([place])   . Unspecified vitamin D deficiency   . Unspecified vitamin D deficiency     Patient Active Problem List   Diagnosis Date Noted  . Abnormal dreams 09/06/2013  . 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 Hyperglycemia 05/18/2013  . Paralysis agitans ([place]) 05/18/2013  . Unspecified vitamin D deficiency  05/18/2013  . Hyperlipidemia LDL goal < 100 05/18/2013  . Parkinson's disease ([place]) 10/26/2012    Past Surgical History:  Procedure Laterality Date  . APPENDECTOMY    . ROTATOR CUFF REPAIR     right       Home Medications    Prior to Admission medications   Medication Sig Start Date End Date Taking? Authorizing Provider  albuterol (PROVENTIL HFA;VENTOLIN HFA) 108 (90 BASE) MCG/ACT inhaler Inhale 2 puffs into the lungs every 6 (six) hours as needed for wheezing or shortness of breath. 07/04/13   Lauree Chandler, NP  carbidopa-levodopa (SINEMET IR) 25-100 MG tablet Take 2 tablets by mouth 3 (three) times daily. 08/18/15   Penumalli, Earlean Polka, MD  Cholecalciferol (VITAMIN D PO) Take 1,000 Units by mouth daily.     [provider]  HYDROcodone-acetaminophen (NORCO/VICODIN) 5-325 MG tablet Take 1-2 tablets by mouth every 4 (four) hours as needed for severe pain. 09/05/16   Margarita Mail, PA-C  ibuprofen (ADVIL,MOTRIN) 400 MG tablet Take 1.5 tablets (600 mg total) by mouth every 6 (six) hours as needed. 10/15/16   Lucciano Vitali, Bea Graff, PA-C  meclizine (ANTIVERT) 25 MG tablet Take 25 mg by mouth daily. 05/11/16   [provider]  oxyCODONE-acetaminophen (PERCOCET/ROXICET) 5-325 MG tablet Take 1 tablet by mouth every 6 (six) hours as needed for moderate pain or severe pain. pain 08/19/15   [provider]  pramipexole (MIRAPEX) 1 MG tablet Take 2 tablets (2 mg total) by mouth 2 (two) times daily. 08/18/15   Penumalli, Bonnita Levan  R, MD  rasagiline (AZILECT) 0.5 MG TABS tablet Take 1 tablet (0.5 mg total) by mouth daily. Patient not taking: Reported on 09/05/2016 08/18/15   Penni Bombard, MD    Family History Family History  Problem Relation Age of Onset  . Cancer Mother   . Cancer Father   . Stroke Brother   . Cancer Sister   . Cancer Sister   . Colon cancer Neg Hx   . Esophageal cancer Neg Hx   . Rectal cancer Neg Hx   . Stomach cancer Neg Hx     Social  History Social History  Substance Use Topics  . Smoking status: Former Research scientist (life sciences)  . Smokeless tobacco: Never Used  . Alcohol use Yes     Comment: occasionally ("a sip every now and then")     Allergies   Patient has no known allergies.   Review of Systems Review of Systems  Constitutional: Negative for chills and fever.  Respiratory: Negative for shortness of breath.   Cardiovascular: Negative for chest pain.  Musculoskeletal: Positive for back pain and myalgias.  All other systems reviewed and are negative.    Physical Exam Updated Vital Signs BP 140/63 (BP Location: Right Arm)   Pulse 91   Temp 98.6 F (37 C) (Oral)   Resp 18   SpO2 96%   Physical Exam  Constitutional: He appears well-developed and well-nourished. No distress.  HENT:  Head: Normocephalic and atraumatic.  Mouth/Throat: Oropharynx is clear and moist. No oropharyngeal exudate.  Eyes: Conjunctivae are normal. Pupils are equal, round, and reactive to light. Right eye exhibits no discharge. Left eye exhibits no discharge. No scleral icterus.  Neck: Normal range of motion. Neck supple. No thyromegaly present.  Cardiovascular: Normal rate, regular rhythm, normal heart sounds and intact distal pulses.  Exam reveals no gallop and no friction rub.   No murmur heard. Pulmonary/Chest: Effort normal and breath sounds normal. No stridor. No respiratory distress. He has no wheezes. He has no rales.  Abdominal: Soft. Bowel sounds are normal. He exhibits no distension. There is no tenderness. There is no rebound and no guarding.  Musculoskeletal: He exhibits no edema.  Paraspinal lumbar and midline tenderness. Right gluteal tenderness.mild tenderness to quadriceps. No calf tenderness bilaterally  Lymphadenopathy:    He has no cervical adenopathy.  Neurological: He is alert. Coordination normal.  Normal sensation to lower extremities. 5/5 strength to lower extremities.  Skin: Skin is warm and dry. No rash noted. He is  not diaphoretic. No pallor.  Psychiatric: He has a normal mood and affect.  Nursing note and vitals reviewed.    ED Treatments / Results  DIAGNOSTIC STUDIES:  Oxygen Saturation is 96% on RA, normal by my interpretation.    COORDINATION OF CARE:  4:00 PM Discussed treatment plan with pt at bedside and pt agreed to plan.  Labs (all labs ordered are listed, but only abnormal results are displayed) Labs Reviewed  BASIC METABOLIC PANEL - Abnormal; Notable for the following:       Result Value   Glucose, Bld 156 (*)    All other components within normal limits  CBC    EKG  EKG Interpretation None       Radiology No results found.  Procedures Procedures (including critical care time)  Medications Ordered in ED Medications  ketorolac (TORADOL) 30 MG/ML injection 30 mg (30 mg Intramuscular Given 10/15/16 1640)     Initial Impression / Assessment and Plan / ED Course  I have  reviewed the triage vital signs and the nursing notes.  Pertinent labs & imaging results that were available during my care of the patient were reviewed by me and considered in my medical decision making (see chart for details).     Patient with known spinal stenosis. CBC, BMP unremarkable. Patient's pain is chronic. No signs of emergent need for MRI or surgery today. Neuro exam is unremarkable, showing no focal deficits. No bowel or bladder incontinence or saddle anesthesia. We will treat patient's pain with Toradol in the ED. We will initiate ibuprofen in addition to Percocet. Patient to follow-up with neurosurgery for further evaluation and potential outpatient MRI. Return precautions discussed. Patient understands and agrees with plan. Patient vitals stable throughout ED course and discharged in satisfactory condition. Patient also evaluated by Dr. Billy Fischer who guided the patient's management and agrees with plan.  Final Clinical Impressions(s) / ED Diagnoses   Final diagnoses:  Bilateral leg  pain  Spinal stenosis, unspecified spinal region    New Prescriptions New Prescriptions   IBUPROFEN (ADVIL,MOTRIN) 400 MG TABLET    Take 1.5 tablets (600 mg total) by mouth every 6 (six) hours as needed.   I personally performed the services described in this documentation, which was scribed in my presence. The recorded information has been reviewed and is accurate.     Frederica Kuster, PA-C 10/15/16 1642    Gareth Morgan, MD 10/15/16 2248

## 2016-10-15 NOTE — ED Triage Notes (Signed)
Pt reports he has had leg pain and swelling X3 weeks. Pt states this morning the pain was so bad he could barely stand. Pt denies any injury or long travel.

## 2016-10-15 NOTE — Discharge Instructions (Signed)
Medications: Ibuprofen  Treatment: Take ibuprofen every 4-6 hours as needed for your back pain. Take this instead of Naprosyn. Do not combine. You can continue taking your Percocet at home as prescribed.  Follow-up: Please follow-up with Dr. Cyndy Freeze, a spine doctor, for further evaluation and treatment of your back pain. Please follow-up with your primary care provider as well for further evaluation and treatment of your pain. Please return to emergency department if you develop any new or worsening symptoms including loss of bowel or bladder control, numbness in your groin, inability to walk, or any other new or concerning symptoms.

## 2016-10-18 ENCOUNTER — Emergency Department (HOSPITAL_COMMUNITY)
Admission: EM | Admit: 2016-10-18 | Discharge: 2016-10-20 | Disposition: A | Discharge status: Home / Self Care | Payer: Medicare HMO | Attending: Emergency Medicine | Admitting: Emergency Medicine

## 2016-10-18 ENCOUNTER — Encounter (HOSPITAL_COMMUNITY): Payer: Self-pay | Admitting: Emergency Medicine

## 2016-10-18 DIAGNOSIS — G20 Parkinson's disease: Secondary | ICD-10-CM | POA: Insufficient documentation

## 2016-10-18 DIAGNOSIS — R531 Weakness: Secondary | ICD-10-CM | POA: Diagnosis not present

## 2016-10-18 DIAGNOSIS — Z87891 Personal history of nicotine dependence: Secondary | ICD-10-CM | POA: Insufficient documentation

## 2016-10-18 DIAGNOSIS — R200 Anesthesia of skin: Secondary | ICD-10-CM | POA: Diagnosis present

## 2016-10-18 DIAGNOSIS — R1310 Dysphagia, unspecified: Secondary | ICD-10-CM | POA: Diagnosis not present

## 2016-10-18 DIAGNOSIS — Z8669 Personal history of other diseases of the nervous system and sense organs: Secondary | ICD-10-CM

## 2016-10-18 LAB — BASIC METABOLIC PANEL
Anion gap: 9 (ref 5–15)
BUN: 18 mg/dL (ref 6–20)
CO2: 23 mmol/L (ref 22–32)
Calcium: 9.6 mg/dL (ref 8.9–10.3)
Chloride: 108 mmol/L (ref 101–111)
Creatinine, Ser: 1.22 mg/dL (ref 0.61–1.24)
GFR calc Af Amer: >60 mL/min (ref >60)
GFR calc non Af Amer: 57 mL/min — ABNORMAL LOW (ref >60)
Glucose, Bld: 94 mg/dL (ref 65–99)
Potassium: 3.7 mmol/L (ref 3.5–5.1)
Sodium: 140 mmol/L (ref 135–145)

## 2016-10-18 LAB — CBC
HCT: 41.5 % (ref 39.0–52.0)
Hemoglobin: 14.1 g/dL (ref 13.0–17.0)
MCH: 27.9 pg (ref 26.0–34.0)
MCHC: 34 g/dL (ref 30.0–36.0)
MCV: 82.2 fL (ref 78.0–100.0)
Platelets: 166 10*3/uL (ref 150–400)
RBC: 5.05 MIL/uL (ref 4.22–5.81)
RDW: 13.5 % (ref 11.5–15.5)
WBC: 6.5 10*3/uL (ref 4.0–10.5)

## 2016-10-18 NOTE — ED Triage Notes (Signed)
Pt and his gf report numbness in bilateral legs that has been ongoing for the past few months, however, pts gf states numbness in legs got significantly worse on Saturday, pt also began having difficulty swallowing. Pt a/ox4. Hx of parkinsons.

## 2016-10-18 NOTE — ED Notes (Addendum)
Pt states that his body was trembling and he could not control it. Pt also can not form words well. Pt speech sounds different but cannot note any slurring, pt states he speech is different. Pt reports this all started about 3 days ago. Pt also reports that he has been having problems swallowing food.

## 2016-10-19 ENCOUNTER — Emergency Department (HOSPITAL_COMMUNITY): Payer: Medicare HMO

## 2016-10-19 LAB — URINALYSIS, ROUTINE W REFLEX MICROSCOPIC
Bilirubin Urine: NEGATIVE
Glucose, UA: NEGATIVE mg/dL
Hgb urine dipstick: NEGATIVE
Ketones, ur: NEGATIVE mg/dL
Nitrite: NEGATIVE
Protein, ur: NEGATIVE mg/dL
Specific Gravity, Urine: 1.016 (ref 1.005–1.030)
pH: 5 (ref 5.0–8.0)

## 2016-10-19 MED ORDER — METHOCARBAMOL 500 MG PO TABS: 500.0000 mg | ORAL_TABLET | Freq: Once | ORAL | Status: DC

## 2016-10-19 NOTE — ED Notes (Signed)
Waiting on SW

## 2016-10-19 NOTE — ED Notes (Signed)
SW at bedside.

## 2016-10-19 NOTE — Progress Notes (Signed)
CSW consult acknowledged re: "Patient having progressive decreased mobility secondary to Parkinson's, culminating in being unable to ambulate yesterday stating he had no strength to be able to stand."  CSW engaged with Patient at his bedside. CSW introduced role of CSW and intiated discussion on discharge plan. Patient reports concern with what has caused him to decline and be unable to ambulate since yesterday. Patient requesting more information regarding his medical condition and reports feeling like no one has explained to him what happened. CSW to staff with attending and RN regarding Patient's medical concerns.   Patient with [employer] [facility] HMO which requires prior authorization before coverage of SNF placement. CSW discussed the process of obtaining authorization for SNF including that Patient must make his decision on where he wants to go to SNF prior to authorization and that CSW will fax appropriate clinicals to insurance company for review. Per [facility], authorization can take 24-48 hours. Private pay or home health care would be required until insurance approves authorization as Patient cannot remain in the Emergency Department awaiting insurance authorization once medically stable for discharge to next point of care.   CSW provided Patient with list of SNF facilities that are in-network with [facility]. Patient would like [facility] if he needs SNF placement. CSW requested RN place a PT order for PT evaluation. CSW to make referral to local SNFs. CSW continues to follow for disposition.    Lorrine Kin, MSW, LCSW [facility] ED/23M Clinical Social Worker [phone]

## 2016-10-19 NOTE — NC FL2 (Signed)
[place] LEVEL OF CARE SCREENING TOOL     IDENTIFICATION  Patient Name: Theodore King Birthdate: Nov 12, 1945 Sex: male Admission Date (Current Location): 10/18/2016  [facility] and [place] Number:  Herbalist and Address:  [facility], [place] [address], [place], [place] [address]      Provider Number: [id number]  Attending Physician Name and Address:  Default, Provider, MD  Relative Name and Phone Number:  Daughter ([name] Howard-Wickham)     Current Level of Care: Hospital Recommended Level of Care: [place] Prior Approval Number:    Date Approved/Denied:   PASRR Number:   [id number]  Discharge Plan: SNF    Current Diagnoses: Patient Active Problem List   Diagnosis Date Noted  . Abnormal dreams 09/06/2013  . 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 Hyperglycemia 05/18/2013  . Paralysis agitans ([place]) 05/18/2013  . Unspecified vitamin D deficiency 05/18/2013  . Hyperlipidemia LDL goal < 100 05/18/2013  . Parkinson's disease ([place]) 10/26/2012    Orientation RESPIRATION BLADDER Height & Weight     Self, Time, Situation, Place  Normal Continent Weight:   Height:     BEHAVIORAL SYMPTOMS/MOOD NEUROLOGICAL BOWEL NUTRITION STATUS   (NONE)   Continent  (regular diet, thin liquids)  AMBULATORY STATUS COMMUNICATION OF NEEDS Skin   Extensive Assist Verbally Normal                       Personal Care Assistance Level of Assistance  Bathing, Feeding, Dressing Bathing Assistance: Maximum assistance Feeding assistance: Independent Dressing Assistance: Maximum assistance     Functional Limitations Info  Sight Sight Info: Adequate Hearing Info: Adequate Speech Info: Adequate    SPECIAL CARE FACTORS FREQUENCY  PT (By licensed PT)     PT Frequency: MIN 5X/WEEK               Contractures Contractures Info: Not present    Additional Factors Info  Code Status, Allergies Code Status Info:  Not on File Allergies Info: No Known Allergies           Current Medications (10/19/2016):  This is the current hospital active medication list Current Facility-Administered Medications  Medication Dose Route Frequency Provider Last Rate Last Dose  . methocarbamol (ROBAXIN) tablet 500 mg  500 mg Oral Once Charlann Lange, PA-C       Current Outpatient Prescriptions  Medication Sig Dispense Refill  . albuterol (PROVENTIL HFA;VENTOLIN HFA) 108 (90 BASE) MCG/ACT inhaler Inhale 2 puffs into the lungs every 6 (six) hours as needed for wheezing or shortness of breath. 1 Inhaler 2  . carbidopa-levodopa (SINEMET IR) 25-100 MG tablet Take 2 tablets by mouth 3 (three) times daily. 540 tablet 4  . Cholecalciferol (VITAMIN D PO) Take 1,000 Units by mouth daily.     Marland Kitchen HYDROcodone-acetaminophen (NORCO/VICODIN) 5-325 MG tablet Take 1-2 tablets by mouth every 4 (four) hours as needed for severe pain. 16 tablet 0  . ibuprofen (ADVIL,MOTRIN) 400 MG tablet Take 1.5 tablets (600 mg total) by mouth every 6 (six) hours as needed. 30 tablet 0  . meclizine (ANTIVERT) 25 MG tablet Take 25 mg by mouth daily.    . Multiple Vitamins-Minerals (MULTIVITAMIN WITH MINERALS) tablet Take 1 tablet by mouth daily.    Marland Kitchen oxyCODONE-acetaminophen (PERCOCET/ROXICET) 5-325 MG tablet Take 1 tablet by mouth every 6 (six) hours as needed for moderate pain or severe pain. pain    . pramipexole (MIRAPEX) 1 MG tablet Take  2 tablets (2 mg total) by mouth 2 (two) times daily. 360 tablet 4  . rasagiline (AZILECT) 0.5 MG TABS tablet Take 1 tablet (0.5 mg total) by mouth daily. (Patient not taking: Reported on 09/05/2016) 30 tablet 12     Discharge Medications: Please see discharge summary for a list of discharge medications.  Relevant Imaging Results:  Relevant Lab Results:   Additional Information SS [id number]  DRAKE, Miachel Roux, LCSW

## 2016-10-19 NOTE — ED Notes (Signed)
Waiting on Speech Therapy. Speech therapy contacted and states they will assign the case.

## 2016-10-19 NOTE — Evaluation (Signed)
Physical Therapy Evaluation Patient Details Name: Theodore King MRN: [id number] DOB: 02-Dec-1945 Today's Date: 10/19/2016   History of Present Illness  71 yo presented to the ED with progressive weakness and numbness bil LE with inability to walk. MRI with L4-S1 stenosis. PMHx: Parkinson's, Right RCR  Clinical Impression  Pt pleasant, soft spoken with difficulty recalling events immediately preceding presentation to ER. Pt and girlfriend state he was walking, working and caring for himself until only a few days ago. Pt currently very slow to respond to commands and initiate movement with assist needed for all transfers and gait. Pt with decreased balance, strength, functional mobility and gait who does not have 24hr assist and will benefit from acute therapy to maximize mobility, gait and independence to decrease burden of care and return pt to mod I level.   HR 71 sats 96% on RA    Follow Up Recommendations SNF;Supervision/Assistance - 24 hour    Equipment Recommendations  Wheelchair (measurements PT);Wheelchair cushion (measurements PT);3in1 (PT)    Recommendations for Other Services       Precautions / Restrictions Precautions Precautions: Fall      Mobility  Bed Mobility Overal bed mobility: Needs Assistance Bed Mobility: Supine to Sit;Sit to Supine     Supine to sit: Mod assist;HOB elevated Sit to supine: Mod assist;+2 for safety/equipment   General bed mobility comments: cues for sequence with decreased initiation and assist to bring both legs off of bed and fully elevate trunk. with return to bed assist to lift legs to surface and control trunk  Transfers Overall transfer level: Needs assistance   Transfers: Sit to/from Stand Sit to Stand: Min assist;+2 safety/equipment         General transfer comment: cues for hand placement and foot positioning prior to standing  Ambulation/Gait Ambulation/Gait assistance: Mod assist;+2  safety/equipment Ambulation Distance (Feet): 10 Feet Assistive device: Rolling walker (2 wheeled) Gait Pattern/deviations: Shuffle;Narrow base of support;Trunk flexed   Gait velocity interpretation: Below normal speed for age/gender General Gait Details: pt with difficulty advancing feet and requiring assist grossly 20% of the time to shift weight to advance feet. Required sequential cues, max assist to direct and manage RW movement. Pt x 3 stepping on left foot with right foot, unaware. Cues for posture and looking up as pt maintaining flexed posture  Stairs            Wheelchair Mobility    Modified Rankin (Stroke Patients Only)       Balance Overall balance assessment: Needs assistance   Sitting balance-Leahy Scale: Fair       Standing balance-Leahy Scale: Poor                               Pertinent Vitals/Pain Pain Assessment: No/denies pain    Home Living Family/patient expects to be discharged to:: Private residence Living Arrangements: Children Available Help at Discharge: Family;Friend(s);Available PRN/intermittently Type of Home: House Home Access: Level entry     Home Layout: One level Home Equipment: Walker - 2 wheels      Prior Function Level of Independence: Independent         Comments: pt reports he cares for himself and 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 Girlfriend present and confirms he has only required assist the last few days     Hand Dominance        Extremity/Trunk Assessment   Upper Extremity Assessment Upper  Extremity Assessment: Generalized weakness (grossly 2/5)    Lower Extremity Assessment Lower Extremity Assessment: Generalized weakness (grossly 2/5)    Cervical / Trunk Assessment Cervical / Trunk Assessment: Kyphotic  Communication   Communication: Expressive difficulties  Cognition Arousal/Alertness: Awake/alert Behavior During Therapy: Flat affect Overall Cognitive Status:  Impaired/Different from baseline Area of Impairment: Memory;Following commands;Safety/judgement                     Memory: Decreased short-term memory Following Commands: Follows one step commands inconsistently;Follows one step commands with increased time Safety/Judgement: Decreased awareness of deficits     General Comments: pt with slow speech with difficulty getting words out at times and deferring some questions to his girlfriend as he has difficulty answering      General Comments      Exercises     Assessment/Plan    PT Assessment Patient needs continued PT services  PT Problem List Decreased strength;Decreased mobility;Decreased safety awareness;Decreased coordination;Decreased activity tolerance;Decreased cognition;Decreased balance;Decreased knowledge of use of DME       PT Treatment Interventions Gait training;Therapeutic exercise;Patient/family education;Cognitive remediation;Therapeutic activities;DME instruction;Balance training;Functional mobility training;Neuromuscular re-education    PT Goals (Current goals can be found in the Care Plan section)  Acute Rehab PT Goals Patient Stated Goal: return home and to work PT Goal Formulation: With patient/family Time For Goal Achievement: 11/02/16 Potential to Achieve Goals: Fair    Frequency Min 3X/week   Barriers to discharge Decreased caregiver support girlfriend reports all family and she work and cannot provide 24hr care    Co-evaluation               AM-PAC PT "6 Clicks" Daily Activity  Outcome Measure Difficulty turning over in bed (including adjusting bedclothes, sheets and blankets)?: Total Difficulty moving from lying on back to sitting on the side of the bed? : Total Difficulty sitting down on and standing up from a chair with arms (e.g., wheelchair, bedside commode, etc,.)?: Total Help needed moving to and from a bed to chair (including a wheelchair)?: A Lot Help needed walking in  hospital room?: A Lot Help needed climbing 3-5 steps with a railing? : Total 6 Click Score: 8    End of Session Equipment Utilized During Treatment: Gait belt Activity Tolerance: Patient tolerated treatment well Patient left: in bed;with call bell/phone within reach;with family/visitor present Nurse Communication: Mobility status PT Visit Diagnosis: Unsteadiness on feet (R26.81);Other abnormalities of gait and mobility (R26.89);Difficulty in walking, not elsewhere classified (R26.2);Muscle weakness (generalized) (M62.81)    Time: 2800-3491 PT Time Calculation (min) (ACUTE ONLY): 24 min   Charges:   PT Evaluation $PT Eval Moderate Complexity: 1 Procedure     PT G Codes:   PT G-Codes **NOT FOR INPATIENT CLASS** Functional Assessment Tool Used: AM-PAC 6 Clicks Basic Mobility Functional Limitation: Mobility: Walking and moving around Mobility: Walking and Moving Around Current Status (P9150): At least 80 percent but less than 100 percent impaired, limited or restricted Mobility: Walking and Moving Around Goal Status [phone]): At least 40 percent but less than 60 percent impaired, limited or restricted    Elwyn Reach, [place]   Sandy Salaam Charmaine Placido 10/19/2016, 2:08 PM

## 2016-10-19 NOTE — Progress Notes (Addendum)
Bed search has been initiated for pt.  Dayshift CSW will facilitate insurance authorization and SNF tx.

## 2016-10-19 NOTE — ED Notes (Signed)
Speech therapy at bedside.

## 2016-10-19 NOTE — ED Notes (Addendum)
Pt stated he needed to have a bm, pt assisted onto bedpan.

## 2016-10-19 NOTE — ED Notes (Signed)
Pt unable to ambulate. While getting out of wheelchair to get to the bed pt required maximum assistance to transfer from chair to bed. Pt unable to lift legs or turn. Pt required EMT to lift legs for him and bare all of his weight on this EMT. RN and MD notified.

## 2016-10-19 NOTE — ED Notes (Signed)
PT at bedside.

## 2016-10-19 NOTE — ED Provider Notes (Signed)
[facility] DEPT Provider Note   CSN: [id number] Arrival date & time: 10/18/16  1823     History   Chief Complaint Chief Complaint  Patient presents with  . Numbness  . Dysphagia    HPI Theodore King is a 70 y.o. male.  Patient with a history of Parkinson's presents with progressive weakness, numbness and pain in bilateral LE's, now to the point where he cannot ambulate. He has used a walker in the past but states he is unsafe ambulating with it as well. When he could walk, he reports getting to a point where if he leaned forward he would not be able to stop himself from falling forward. No injuries, fever, vomiting, diarrhea. Per girlfriend he is having difficulty swallowing, however, the patient denies this. The patient also complains of spasm type pain in bilateral anterior thighs.    The history is provided by the patient and a significant other. No language interpreter was used.    Past Medical History:  Diagnosis Date  . Disorders of bursae and tendons in shoulder region, unspecified   . Other and unspecified hyperlipidemia   . Other and unspecified hyperlipidemia   . 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 Parkinson's disease ([place])   . Unspecified vitamin D deficiency   . Unspecified vitamin D deficiency     Patient Active Problem List   Diagnosis Date Noted  . Abnormal dreams 09/06/2013  . 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 Hyperglycemia 05/18/2013  . Paralysis agitans ([place]) 05/18/2013  . Unspecified vitamin D deficiency 05/18/2013  . Hyperlipidemia LDL goal < 100 05/18/2013  . Parkinson's disease ([place]) 10/26/2012    Past Surgical History:  Procedure Laterality Date  . APPENDECTOMY    . ROTATOR CUFF REPAIR     right       Home Medications    Prior to Admission medications   Medication Sig Start Date End Date Taking? Authorizing Provider  albuterol (PROVENTIL HFA;VENTOLIN HFA) 108 (90 BASE) MCG/ACT inhaler Inhale 2  puffs into the lungs every 6 (six) hours as needed for wheezing or shortness of breath. 07/04/13   Lauree Chandler, NP  carbidopa-levodopa (SINEMET IR) 25-100 MG tablet Take 2 tablets by mouth 3 (three) times daily. 08/18/15   Penumalli, Earlean Polka, MD  Cholecalciferol (VITAMIN D PO) Take 1,000 Units by mouth daily.     [provider]  HYDROcodone-acetaminophen (NORCO/VICODIN) 5-325 MG tablet Take 1-2 tablets by mouth every 4 (four) hours as needed for severe pain. 09/05/16   Margarita Mail, PA-C  ibuprofen (ADVIL,MOTRIN) 400 MG tablet Take 1.5 tablets (600 mg total) by mouth every 6 (six) hours as needed. 10/15/16   Law, Bea Graff, PA-C  meclizine (ANTIVERT) 25 MG tablet Take 25 mg by mouth daily. 05/11/16   [provider]  oxyCODONE-acetaminophen (PERCOCET/ROXICET) 5-325 MG tablet Take 1 tablet by mouth every 6 (six) hours as needed for moderate pain or severe pain. pain 08/19/15   [provider]  pramipexole (MIRAPEX) 1 MG tablet Take 2 tablets (2 mg total) by mouth 2 (two) times daily. 08/18/15   Penumalli, Earlean Polka, MD  rasagiline (AZILECT) 0.5 MG TABS tablet Take 1 tablet (0.5 mg total) by mouth daily. Patient not taking: Reported on 09/05/2016 08/18/15   Penni Bombard, MD    Family History Family History  Problem Relation Age of Onset  . Cancer Mother   . Cancer Father   . Stroke Brother   . Cancer Sister   .  Cancer Sister   . Colon cancer Neg Hx   . Esophageal cancer Neg Hx   . Rectal cancer Neg Hx   . Stomach cancer Neg Hx     Social History Social History  Substance Use Topics  . Smoking status: Former Research scientist (life sciences)  . Smokeless tobacco: Never Used  . Alcohol use Yes     Comment: occasionally ("a sip every now and then")     Allergies   Patient has no known allergies.   Review of Systems Review of Systems  Constitutional: Negative for chills and fever.  HENT: Negative.   Respiratory: Negative.   Cardiovascular: Negative.     Gastrointestinal: Negative.   Musculoskeletal:       See HPI.  Skin: Negative.   Neurological:       See HPI.     Physical Exam Updated Vital Signs BP (!) 144/80   Pulse 65   Temp 98.4 F (36.9 C)   Resp 15   SpO2 99%   Physical Exam  Constitutional: He is oriented to person, place, and time. He appears well-developed and well-nourished.  HENT:  Head: Normocephalic.  Neck: Normal range of motion. Neck supple.  Cardiovascular: Normal rate and regular rhythm.   Pulmonary/Chest: Effort normal and breath sounds normal. He has no wheezes. He has no rales.  Abdominal: Soft. Bowel sounds are normal. There is no tenderness. There is no rebound and no guarding.  Musculoskeletal: Normal range of motion.  Neurological: He is alert and oriented to person, place, and time.  The patient has delayed, slow movements without significant tremor. His speech is clear, oriented but delayed. Full strength of bilateral LE's while supine.   Skin: Skin is warm and dry. No rash noted.  Psychiatric: He has a normal mood and affect.     ED Treatments / Results  Labs (all labs ordered are listed, but only abnormal results are displayed) Labs Reviewed  BASIC METABOLIC PANEL - Abnormal; Notable for the following:       Result Value   GFR calc non Af Amer 57 (*)    All other components within normal limits  URINALYSIS, ROUTINE W REFLEX MICROSCOPIC - Abnormal; Notable for the following:    Leukocytes, UA SMALL (*)    Bacteria, UA RARE (*)    Squamous Epithelial / LPF 0-5 (*)    All other components within normal limits  CBC   Results for orders placed or performed during the hospital encounter of 24/23/53  Basic metabolic panel  Result Value Ref Range   Sodium 140 135 - 145 mmol/L   Potassium 3.7 3.5 - 5.1 mmol/L   Chloride 108 101 - 111 mmol/L   CO2 23 22 - 32 mmol/L   Glucose, Bld 94 65 - 99 mg/dL   BUN 18 6 - 20 mg/dL   Creatinine, Ser 1.22 0.61 - 1.24 mg/dL   Calcium 9.6 8.9 - 10.3  mg/dL   GFR calc non Af Amer 57 (L) >60 mL/min   GFR calc Af Amer >60 >60 mL/min   Anion gap 9 5 - 15  CBC  Result Value Ref Range   WBC 6.5 4.0 - 10.5 K/uL   RBC 5.05 4.22 - 5.81 MIL/uL   Hemoglobin 14.1 13.0 - 17.0 g/dL   HCT 41.5 39.0 - 52.0 %   MCV 82.2 78.0 - 100.0 fL   MCH 27.9 26.0 - 34.0 pg   MCHC 34.0 30.0 - 36.0 g/dL   RDW 13.5 11.5 - 15.5 %  Platelets 166 150 - 400 K/uL  Urinalysis, Routine w reflex microscopic  Result Value Ref Range   Color, Urine YELLOW YELLOW   APPearance CLEAR CLEAR   Specific Gravity, Urine 1.016 1.005 - 1.030   pH 5.0 5.0 - 8.0   Glucose, UA NEGATIVE NEGATIVE mg/dL   Hgb urine dipstick NEGATIVE NEGATIVE   Bilirubin Urine NEGATIVE NEGATIVE   Ketones, ur NEGATIVE NEGATIVE mg/dL   Protein, ur NEGATIVE NEGATIVE mg/dL   Nitrite NEGATIVE NEGATIVE   Leukocytes, UA SMALL (A) NEGATIVE   RBC / HPF 0-5 0 - 5 RBC/hpf   WBC, UA 0-5 0 - 5 WBC/hpf   Bacteria, UA RARE (A) NONE SEEN   Squamous Epithelial / LPF 0-5 (A) NONE SEEN   Mucous PRESENT    Hyaline Casts, UA PRESENT    EKG  EKG Interpretation None       Radiology No results found.  Procedures Procedures (including critical care time)  Medications Ordered in ED Medications  methocarbamol (ROBAXIN) tablet 500 mg (500 mg Oral Not Given 10/19/16 0059)     Initial Impression / Assessment and Plan / ED Course  I have reviewed the triage vital signs and the nursing notes.  Pertinent labs & imaging results that were available during my care of the patient were reviewed by me and considered in my medical decision making (see chart for details).     Patient with a history of Parkinson Disease with progressive movement difficulty and weakness of LE's. He described numbness in lower legs and pain in anterior thighs bilaterally. Per patient and girlfriend, he has gradually become weaker over the last 2 weeks to where he cannot ambulate now.   MR lumbar spine done to determine cause of  weakness. MR negative for acute changes. He is examined by Dr. Christy Gentles and symptoms felt more likely to represent progression of Parkinson's. Social work consultation will be requested to evaluate the patient for either rehab placement for strength therapy, or assistance for in-home help - nursing, PT, OT.   Of note the patient did not pass a swallow screen. Swallow evaluation ordered and is pending.     Final Clinical Impressions(s) / ED Diagnoses   Final diagnoses:  None   1. Progressive weakness 2. Dysphagia 3. History of parkinson's disease.   New Prescriptions New Prescriptions   No medications on file     Charlann Lange, Hershal Coria 10/19/16 [id number]    Ripley Fraise, MD 10/19/16 [phone]

## 2016-10-19 NOTE — ED Provider Notes (Signed)
Patient seen/examined in the Emergency Department in conjunction with Midlevel Provider [place] Patient reports progressive dysphagia, generalized weakness, bilateral LE pain and difficulty walking Exam : awake/alert, no distress, no focal extremity weakness Plan: pt unable to ambulate He has known h/o spinal stenosis with increasing weakness Plan to get MRI lumbar spine to compare to prior If no emergent findings, plan to consult social work/case management      Ripley Fraise, MD 10/19/16 [phone]

## 2016-10-19 NOTE — Evaluation (Signed)
Clinical/Bedside Swallow Evaluation Patient Details  Name: Theodore King MRN: [id number] Date of Birth: [birth year]  Today's Date: 10/19/2016 Time: SLP Start Time (ACUTE ONLY): 1430 SLP Stop Time (ACUTE ONLY): 1450 SLP Time Calculation (min) (ACUTE ONLY): 20 min  Past Medical History:  Past Medical History:  Diagnosis Date  . Disorders of bursae and tendons in shoulder region, unspecified   . Other and unspecified hyperlipidemia   . Other and unspecified hyperlipidemia   . 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 Parkinson's disease ([place])   . Unspecified vitamin D deficiency   . Unspecified vitamin D deficiency    Past Surgical History:  Past Surgical History:  Procedure Laterality Date  . APPENDECTOMY    . ROTATOR CUFF REPAIR     right   HPI:  71 y.o. male withhistory of Parkinson Disease with progressive movement difficulty and weakness of LEs, presented to ED with inability to walk.  MRI with L4-S1 stenosis.  Per chart, girlfriend reports difficulty swallowing.  Followed by [facility].  Pt denies any issues with swallowing.    Assessment / Plan / Recommendation Clinical Impression  Pt presents with s/s of a Parkinson's-based dysphagia.  Phonation c/b hoarse, wet quality; low volume.  Hypomimia.  Consumption of water led to coughing on initial trials, but subsequent swallows (the remainder of six oz cup) did not elicit cough.  Pt needed assist with self-feeding.  He consumed pureed and regular solids with slowed but functional mastication.  Per RN, pt is likely to D/C to SNF.  Pt would benefit from an MBS, but scheduling in radiology would not allow for the procedure to be completed today. For now, recommend resuming a regular diet, thin liquids; give meds whole in puree.  Please order an outpatient MBS to objectively assess function, determine baseline performance.  D/W pt, RN.    SLP Visit Diagnosis: Dysphagia, unspecified (R13.10)    Aspiration Risk  Mild  aspiration risk    Diet Recommendation   regular diet, thin liquids  Medication Administration: Whole meds with puree    Other  Recommendations Oral Care Recommendations: Oral care BID   Follow up Recommendations        Frequency and Duration            Prognosis        Swallow Study   General HPI: 71 y.o. male withhistory of Parkinson Disease with progressive movement difficulty and weakness of LEs, presented to ED with inability to walk.  MRI with L4-S1 stenosis.  Per chart, girlfriend reports difficulty swallowing.  Followed by [facility].  Pt denies any issues with swallowing.  Type of Study: Bedside Swallow Evaluation Previous Swallow Assessment: no Diet Prior to this Study: NPO Temperature Spikes Noted: No Respiratory Status: Room air History of Recent Intubation: No Behavior/Cognition: Alert;Cooperative Oral Cavity Assessment: Within Functional Limits Oral Care Completed by SLP: No Oral Cavity - Dentition: Adequate natural dentition Vision: Functional for self-feeding Self-Feeding Abilities: Able to feed self;Needs assist Patient Positioning: Upright in bed Baseline Vocal Quality: Wet;Hoarse Volitional Cough: Weak Volitional Swallow: Able to elicit    Oral/Motor/Sensory Function Overall Oral Motor/Sensory Function: Generalized oral weakness (hypomimia)   Ice Chips Ice chips: Within functional limits   Thin Liquid Thin Liquid: Impaired Presentation: Cup;Straw Pharyngeal  Phase Impairments: Wet Vocal Quality;Cough - Immediate (coughed initial two boluses; no further coughing)    Nectar Thick Nectar Thick Liquid: Not tested   Honey Thick Honey Thick Liquid: Not tested   Puree Puree: Within functional  limits   Solid   GO   Solid: Within functional limits        Juan Quam Laurice 10/19/2016,3:17 PM  Estill Bamberg L. Tivis Ringer, [place] CCC/SLP Pager [phone]

## 2016-10-20 MED ORDER — CARBIDOPA-LEVODOPA 25-100 MG PO TABS
2.0000 | ORAL_TABLET | Freq: Three times a day (TID) | ORAL | Status: DC
  Administered 2016-10-20 (×2): 2 via ORAL
  Filled 2016-10-20 (×4): qty 2

## 2016-10-20 MED ORDER — ALBUTEROL SULFATE HFA 108 (90 BASE) MCG/ACT IN AERS: 2.0000 | INHALATION_SPRAY | Freq: Four times a day (QID) | RESPIRATORY_TRACT | Status: DC | PRN

## 2016-10-20 MED ORDER — HYDROCODONE-ACETAMINOPHEN 5-325 MG PO TABS: 1.0000 | ORAL_TABLET | Freq: Four times a day (QID) | ORAL | Status: DC | PRN

## 2016-10-20 MED ORDER — PRAMIPEXOLE DIHYDROCHLORIDE 1 MG PO TABS
2.0000 mg | ORAL_TABLET | Freq: Two times a day (BID) | ORAL | Status: DC
  Administered 2016-10-20: 2 mg via ORAL
  Filled 2016-10-20 (×2): qty 2

## 2016-10-20 NOTE — ED Notes (Signed)
Meal tray ordered by Network engineer. Case worker speaking with facility about placement.

## 2016-10-20 NOTE — Progress Notes (Signed)
CSW has initiated insurance authorization and faxed over clinicals. CSW has also contacted Admissions Director at facility of Patient's choice regarding bed availability. HIPAA compliant voice message left requesting return phone call. CSW continues to follow for SNF placement.    Lorrine Kin, MSW, LCSW [facility] ED/60M Clinical Social Worker [phone]

## 2016-10-20 NOTE — ED Notes (Signed)
Pt finished breakfast, pt accepted to [facility] per SW at bedside, awaiting insurance acceptance per SW, family & pt up to date on plan

## 2016-10-20 NOTE — Discharge Instructions (Signed)
Family will transfer patient to nursing facility. Patient has been accepted at nursing facility.

## 2016-10-20 NOTE — ED Notes (Signed)
Pt sitting in recliner in room with education about call bell. Also spoke with pt about waiting for placement for rehab center.

## 2016-10-20 NOTE — Progress Notes (Signed)
Speech Pathology Late entry from 10/19/16  10/19/16 1500  SLP Visit Information  SLP Received On 10/19/16  Subjective  Subjective communicative; no family present  General Information  HPI 71 y.o. male withhistory of Parkinson Disease with progressive movement difficulty and weakness of LEs, presented to ED with inability to walk.  MRI with L4-S1 stenosis.  Per chart, girlfriend reports difficulty swallowing.  Followed by [facility].  Pt denies any issues with swallowing.   Type of Study Bedside Swallow Evaluation  Previous Swallow Assessment no  Diet Prior to this Study NPO  Temperature Spikes Noted No  Respiratory Status Room air  History of Recent Intubation No  Behavior/Cognition Alert;Cooperative  Oral Cavity Assessment WFL  Oral Care Completed by SLP No  Oral Cavity - Dentition Adequate natural dentition  Vision Functional for self-feeding  Self-Feeding Abilities Able to feed self;Needs assist  Patient Positioning Upright in bed  Baseline Vocal Quality Wet;Hoarse  Volitional Cough Weak  Volitional Swallow Able to elicit  Oral Motor/Sensory Function  Overall Oral Motor/Sensory Function Generalized oral weakness (hypomimia)  Ice Chips  Ice chips WFL  Thin Liquid  Thin Liquid Impaired  Presentation Cup;Straw  Pharyngeal  Phase Impairments Wet Vocal Quality;Cough - Immediate (coughed initial two boluses; no further coughing)  Nectar Thick Liquid  Nectar Thick Liquid NT  Honey Thick Liquid  Honey Thick Liquid NT  Puree  Puree WFL  Solid  Solid WFL  SLP - End of Session  Patient left in bed;with call bell/phone within reach  Nurse Communication Diet recommendation  SLP Assessment  Clinical Impression Statement (ACUTE ONLY) Pt presents with s/s of a Parkinson's-based dysphagia.  Phonation c/b hoarse, wet quality; low volume.  Hypomimia.  Consumption of water led to coughing on initial trials, but subsequent swallows (the remainder of six oz cup) did not elicit cough.  Pt needed  assist with self-feeding.  He consumed pureed and regular solids with slowed but functional mastication.  For now, recommend resuming a regular diet, thin liquids; give meds whole in puree.  Please order an outpatient MBS to objectively assess function, determine baseline performance.  D/W pt, RN.   SLP Visit Diagnosis Dysphagia, unspecified (R13.10)  Impact on safety and function Mild aspiration risk  Swallow Evaluation Recommendations  SLP Diet Recommendations Thin;Age appropriate regular  Liquid Administration via Cup  Medication Administration Whole meds with puree  Supervision Full assist for feeding  Compensations Slow rate;Small sips/bites  Postural Changes Seated upright at 90 degrees  Treatment Plan  Oral Care Recommendations Oral care BID  Treatment Recommendations Other (Comment) (return for OP MBS)  Individuals Consulted  Consulted and Agree with Results and Recommendations Patient;RN  SLP Time Calculation  SLP Start Time (ACUTE ONLY) 1430  SLP Stop Time (ACUTE ONLY) 1450  SLP Time Calculation (min) (ACUTE ONLY) 20 min  SLP G-Codes **NOT FOR INPATIENT CLASS**  Functional Assessment Tool Used clinical judgment  Functional Limitations Swallowing  Swallow Current Status (X2119) CI  Swallow Goal Status (E1740) CI  Swallow Discharge Status (C1448) CI  SLP Evaluations  $ SLP Speech Visit 1 Procedure  SLP Evaluations  $BSS Swallow 1 Procedure

## 2016-10-20 NOTE — Progress Notes (Addendum)
Authorization received. Reference number [id number]; [place]- RUB 601 minutes. Next review date 10/22/2016.   Patient will DC to: [facility] Anticipated DC date: 10/20/2016 Family notified: Patient and daughter Shamond Skelton at bedside Transport by: Research officer, political party to call report to: [phone]  CSW signing off. Please contact if new need(s) arise.   Lorrine Kin, MSW, LCSW [facility] ED/31M Clinical Social Worker [phone]

## 2016-10-21 ENCOUNTER — Non-Acute Institutional Stay (SKILLED_NURSING_FACILITY): Payer: Medicare HMO | Admitting: Internal Medicine

## 2016-10-21 ENCOUNTER — Encounter: Payer: Self-pay | Admitting: Internal Medicine

## 2016-10-21 DIAGNOSIS — G20 Parkinson's disease: Secondary | ICD-10-CM | POA: Diagnosis not present

## 2016-10-21 DIAGNOSIS — M5442 Lumbago with sciatica, left side: Secondary | ICD-10-CM

## 2016-10-21 DIAGNOSIS — H8113 Benign paroxysmal vertigo, bilateral: Secondary | ICD-10-CM

## 2016-10-21 DIAGNOSIS — R531 Weakness: Secondary | ICD-10-CM | POA: Diagnosis not present

## 2016-10-21 DIAGNOSIS — E559 Vitamin D deficiency, unspecified: Secondary | ICD-10-CM

## 2016-10-21 DIAGNOSIS — M5441 Lumbago with sciatica, right side: Secondary | ICD-10-CM

## 2016-10-21 NOTE — Progress Notes (Signed)
LOCATION: [facility]  PCP: [name], MD   Code Status: DNR  Goals of care: Advanced Directive information Advanced Directives 10/15/2016  Does Patient Have a Medical Advance Directive? No  Would patient like information on creating a medical advance directive? No - Patient declined       Extended Emergency Contact Information Primary Emergency Contact: March [place] Mobile Phone: [phone] Relation: Daughter Secondary Emergency Contact: Kreamer,Betty Address: [place] [address] [address] of [place] Phone: [phone] Work Phone: [phone] Relation: Sister   No Known Allergies  Chief Complaint  Patient presents with  . New Admit To SNF    New Admission Visit      HPI:  Patient is a 71 y.o. male seen today for short term rehabilitation post ED visit for generalized weakness with difficulty to ambulate. He has medical history of parkinson's disease, vitamin d def, neuropathy. He is seen in his room today. Of note, he had a near fall 2 weeks back following which he started having back and leg pain and weakness that progressed. He was not using any assistive device prior to this.   Review of Systems:  Constitutional: Negative for fever, chills.  HENT: Negative for headache, congestion, nasal discharge, sore throat, difficulty swallowing.   Eyes: Negative for eye pain, blurred vision, double vision and discharge. Wears glasses. Respiratory: Negative for cough, shortness of breath and wheezing.   Cardiovascular: Negative for chest pain, palpitations, leg swelling.  Gastrointestinal: Negative for heartburn, nausea, vomiting, abdominal pain, loss of appetite. Last bowel movement was yesterday.  Genitourinary: Negative for dysuria and flank pain.  Musculoskeletal: Negative for fall in the facility. Positive for lower back pain radiating to his groin and leg. Positive for lower extremity weakness. Skin: Negative  for itching, rash.  Neurological: Negative for dizziness. Positive for tingling to his legs.  Psychiatric/Behavioral: Negative for depression.   Past Medical History:  Diagnosis Date  . Disorders of bursae and tendons in shoulder region, unspecified   . Other and unspecified hyperlipidemia   . Other and unspecified hyperlipidemia   . 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 Parkinson's disease ([facility])   . Unspecified vitamin D deficiency   . Unspecified vitamin D deficiency    Past Surgical History:  Procedure Laterality Date  . APPENDECTOMY    . ROTATOR CUFF REPAIR     right   Social History:   reports that he has quit smoking. He has never used smokeless tobacco. He reports that he drinks alcohol. He reports that he does not use drugs.  Family History  Problem Relation Age of Onset  . Cancer Mother   . Cancer Father   . Stroke Brother   . Cancer Sister   . Cancer Sister   . Colon cancer Neg Hx   . Esophageal cancer Neg Hx   . Rectal cancer Neg Hx   . Stomach cancer Neg Hx     Medications: Allergies as of 10/21/2016   No Known Allergies     Medication List       Accurate as of 10/21/16  9:34 AM. Always use your most recent med list.          albuterol 108 (90 Base) MCG/ACT inhaler Commonly known as:  PROVENTIL HFA;VENTOLIN HFA Inhale 2 puffs into the lungs every 6 (six) hours as needed for wheezing or shortness of breath.   carbidopa-levodopa 25-100 MG tablet Commonly known as:  SINEMET IR Take 2  tablets by mouth 3 (three) times daily.   HYDROcodone-acetaminophen 5-325 MG tablet Commonly known as:  NORCO/VICODIN Take 1-2 tablets by mouth every 4 (four) hours as needed for severe pain.   ibuprofen 400 MG tablet Commonly known as:  ADVIL,MOTRIN Take 1.5 tablets (600 mg total) by mouth every 6 (six) hours as needed.   meclizine 25 MG tablet Commonly known as:  ANTIVERT Take 25 mg by mouth daily.   multivitamin with minerals tablet Take 1  tablet by mouth daily.   oxyCODONE-acetaminophen 5-325 MG tablet Commonly known as:  PERCOCET/ROXICET Take 1 tablet by mouth every 6 (six) hours as needed for moderate pain or severe pain. pain   pramipexole 1 MG tablet Commonly known as:  MIRAPEX Take 2 tablets (2 mg total) by mouth 2 (two) times daily.   rasagiline 0.5 MG Tabs tablet Commonly known as:  AZILECT Take 1 tablet (0.5 mg total) by mouth daily.   VITAMIN D PO Take 1,000 Units by mouth daily.       Immunizations: Immunization History  Administered Date(s) Administered  . Influenza Whole 03/07/2010, 03/29/2013  . Influenza-Unspecified 02/10/2014  . PPD Test 10/20/2016  . Pneumococcal Polysaccharide-23 07/12/2011  . Tdap 07/12/2011     Physical Exam: Vitals:   10/21/16 0922  BP: 128/77  Pulse: 69  Resp: 18  Temp: (!) 96.3 F (35.7 C)  TempSrc: Oral  SpO2: 97%  Weight: 230 lb (104.3 kg)  Height: 5' 11.5" (1.816 m)   Body mass index is 31.63 kg/m.  General- elderly male, obese, in no acute distress Head- normocephalic, atraumatic Nose- no nasal discharge Throat- moist mucus membrane, normal oropharynx  Eyes- PERRLA, EOMI, no pallor, no icterus, no discharge, normal conjunctiva, normal sclera Neck- no cervical lymphadenopathy Cardiovascular- normal s1,s2, no murmur Respiratory- bilateral clear to auscultation, no wheeze, no rhonchi, no crackles, no use of accessory muscles Abdomen- bowel sounds present, soft, non tender, no guarding or rigidity Musculoskeletal- able to move all 4 extremities, weakness to his lower extremities Neurological- alert and oriented to person, place and time, resting tremor Skin- warm and dry Psychiatry- normal mood and affect    Labs reviewed: Basic Metabolic Panel:  Recent Labs  09/15/16 1854 10/15/16 1307 10/18/16 1909  NA 143 140 140  K 4.0 3.8 3.7  CL 104 108 108  CO2  --  23 23  GLUCOSE 64* 156* 94  BUN 13 17 18   CREATININE 1.10 1.13 1.22  CALCIUM  --   9.3 9.6   CBC:  Recent Labs  09/15/16 1854 10/15/16 1307 10/18/16 1909  WBC  --  6.9 6.5  HGB 12.9* 13.4 14.1  HCT 38.0* 40.3 41.5  MCV  --  82.2 82.2  PLT  --  168 166     Radiological Exams: Mr Lumbar Spine Wo Contrast  Result Date: 10/19/2016 CLINICAL DATA:  Bilateral leg numbness EXAM: MRI LUMBAR SPINE WITHOUT CONTRAST TECHNIQUE: Multiplanar, multisequence MR imaging of the lumbar spine was performed. No intravenous contrast was administered. 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 Numbering is preserved from the prior studies. Alignment:  No static subluxation. Vertebrae: No acute compression fracture. Unchanged hemangioma within the L2 vertebral body. Conus medullaris: Extends to the L2 level and appears normal. Paraspinal and other soft tissues: The visualized aorta, IVC and iliac vessels are normal. The visualized retroperitoneal organs and paraspinal soft tissues are normal. Disc levels: T12-L1: Normal disc space and facets. No  spinal canal or neuroforaminal stenosis. L1-L2: Normal disc space and facets. No spinal canal or neuroforaminal stenosis. L2-L3: Normal disc space and facets. No spinal canal or neuroforaminal stenosis. L3-L4: There is disc desiccation with a small bulge. No spinal canal stenosis or neural foraminal stenosis. Mild facet arthropathy. L4-L5: Disc desiccation and diffuse bulge. No spinal canal stenosis. Mild bilateral neural foraminal stenosis, slightly worse on the left. Moderate facet hypertrophy. L5-S1: Severe disc space narrowing with endplate spurring. Moderate facet hypertrophy. No spinal canal stenosis. Bilateral moderate foraminal stenosis, unchanged. Visualized sacrum: Normal. IMPRESSION: 1. No acute abnormality of the lumbar spine. 2. Moderate bilateral L5-S1 neural foraminal stenosis, unchanged. 3. Bilateral mild L4-L5 foraminal stenosis, left  slightly worse than right, unchanged. 4. No spinal canal stenosis. 5. Re-demonstration of transitional lumbosacral anatomy. Electronically Signed   By: Ulyses Jarred M.D.   On: 10/19/2016 04:23    Assessment/Plan  Generalized weakness To his lower extremities. Will have him work with physical therapy and occupational therapy team to help with gait training and muscle strengthening exercises.fall precautions. Skin care. Encourage to be out of bed.   Low back pain with radiculopathy From Lumbar foraminal stenosis noted on MRI lumbar spine. His trauma could have caused some inflammation as well. Continue ibuprofen 600 mg q6h prn pain, hydrocodone-apap 5-325 mg 1-2 tab q4h prn and oxycodone-acetaminophen 5-325 mg q6h prn pain. PMR consult. D/c oxycodone apap for now. Will have patient work with PT/OT as tolerated to regain strength and restore function.  Fall precautions are in place.   Parkinson's disease Continue pramipexole bid, rasagiline daily with carbidopa-levodopa tid.   BPV On meclizine on daily basis, denies dizziness, monitor  Vit d def Continue vit d supplement   Goals of care: short term rehabilitation   Labs/tests ordered: cbc, cmp 10/25/16  Family/ staff Communication: reviewed care plan with patient and nursing supervisor    Blanchie Serve, MD Internal Medicine [address] [place], [place] [address] Cell Phone (Monday-Friday 8 am - 5 pm): [phone] On Call: [phone] and follow prompts after 5 pm and on weekends Office Phone: [phone] Office Fax: [phone]

## 2016-10-22 LAB — HEPATIC FUNCTION PANEL
ALT: 17 U/L (ref 10–40)
AST: 16 U/L (ref 14–40)
Alkaline Phosphatase: 62 U/L (ref 25–125)
Bilirubin, Total: 1.5 mg/dL

## 2016-10-22 LAB — BASIC METABOLIC PANEL
BUN: 14 mg/dL (ref 4–21)
Creatinine: 0.9 mg/dL (ref 0.6–1.3)
Glucose: 112 mg/dL
Potassium: 3.9 mmol/L (ref 3.4–5.3)
Sodium: 141 mmol/L (ref 137–147)

## 2016-10-22 LAB — CBC AND DIFFERENTIAL
HCT: 39 % — AB (ref 41–53)
Hemoglobin: 13.2 g/dL — AB (ref 13.5–17.5)
Platelets: 151 10*3/uL (ref 150–399)
WBC: 6.6 10^3/mL

## 2016-11-02 ENCOUNTER — Telehealth: Payer: Self-pay | Admitting: Diagnostic Neuroimaging

## 2016-11-02 MED ORDER — PRAMIPEXOLE DIHYDROCHLORIDE 1 MG PO TABS: 2.0000 mg | ORAL_TABLET | Freq: Two times a day (BID) | ORAL | 0 refills | Status: DC

## 2016-11-02 NOTE — Telephone Encounter (Signed)
One month's refill sent to pharmacy with note that patient must come in for FU on 12/03/16 to receive further refills. Last seen 08/08/15.

## 2016-11-02 NOTE — Telephone Encounter (Signed)
Pt. Requesting refill of Mirapex. Best call back [phone]

## 2016-11-04 ENCOUNTER — Non-Acute Institutional Stay (SKILLED_NURSING_FACILITY): Payer: Medicare HMO | Admitting: Family

## 2016-11-04 ENCOUNTER — Encounter: Payer: Self-pay | Admitting: Family

## 2016-11-04 DIAGNOSIS — E559 Vitamin D deficiency, unspecified: Secondary | ICD-10-CM | POA: Diagnosis not present

## 2016-11-04 DIAGNOSIS — G8929 Other chronic pain: Secondary | ICD-10-CM | POA: Diagnosis not present

## 2016-11-04 DIAGNOSIS — G20 Parkinson's disease: Secondary | ICD-10-CM | POA: Diagnosis not present

## 2016-11-04 DIAGNOSIS — M545 Low back pain: Secondary | ICD-10-CM | POA: Diagnosis not present

## 2016-11-04 DIAGNOSIS — H8110 Benign paroxysmal vertigo, unspecified ear: Secondary | ICD-10-CM | POA: Diagnosis not present

## 2016-11-04 DIAGNOSIS — R2681 Unsteadiness on feet: Secondary | ICD-10-CM | POA: Diagnosis not present

## 2016-11-04 NOTE — Progress Notes (Signed)
Location:  [facility] Room Number: [phone] Place of Service:  SNF [phone])  Provider: Marlowe Sax FNP-C   PCP: [name], MD Patient Care Team: [name], MD as PCP - General (Internal Medicine)  Extended Emergency Contact Information Primary Emergency Contact: [place] of [place] Mobile Phone: [phone] Relation: Daughter Secondary Emergency Contact: Schake,Betty Address: [place] [address] [address] of [place] Phone: [phone] Work Phone: [phone] Relation: Sister  Code Status: Full code  Goals of care:  Advanced Directive information Advanced Directives 11/04/2016  Does Patient Have a Medical Advance Directive? No  Would patient like information on creating a medical advance directive? -     No Known Allergies  Chief Complaint  Patient presents with  . Discharge Note    Discharge from [facility]    HPI:  71 y.o. male seen today at [facility] for discharge home. He was here for short term rehabilitation for post ED visit for generalized weakness with difficulty to ambulate. He has a medical history of Parkinson's disease, Neuropathy and Vit D deficiency. He is seen in his room today. He denies any acute issues. He states ready to go home today. He has had unremarkable stay here in rehab.He has worked well with PT/OT now stable for discharge home.He will be discharged home with Home health PT/OT to continue with ROM, Exercise, Gait stability and muscle strengthening. He does not require any DME he states has own FWW at home. Home health services will be arranged by facility social worker prior to discharge.He will be discharged home with his medication from the facility.He will discharge home with his medications from the facility. Prescription medication will be written x 1 month then patient to follow up with PCP in 1-2 weeks.Facility staff report no new  concerns.   Past Medical History:  Diagnosis Date  . Disorders of bursae and tendons in shoulder region, unspecified   . Other and unspecified hyperlipidemia   . Other and unspecified hyperlipidemia   . 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 Parkinson's disease ([facility])   . Unspecified vitamin D deficiency   . Unspecified vitamin D deficiency     Past Surgical History:  Procedure Laterality Date  . APPENDECTOMY    . ROTATOR CUFF REPAIR     right      reports that he has quit smoking. He has never used smokeless tobacco. He reports that he drinks alcohol. He reports that he does not use drugs. Social History   Social History  . Marital status: Legally Separated    Spouse name: N/A  . Number of children: 3  . Years of education: 12th   Occupational History  .  Retired   Social History Main Topics  . Smoking status: Former Research scientist (life sciences)  . Smokeless tobacco: Never Used  . Alcohol use Yes     Comment: occasionally ("a sip every now and then")  . Drug use: No  . Sexual activity: No   Other Topics Concern  . Not on file   Social History Narrative  . No narrative on file   No Known Allergies  Pertinent  Health Maintenance Due  Topic Date Due  . PNA vac Low Risk Adult (2 of 2 - PCV13) 07/11/2012  . INFLUENZA VACCINE  01/05/2017  . COLONOSCOPY  10/18/2017    Medications: Allergies as of 11/04/2016   No Known Allergies     Medication List  Accurate as of 11/04/16  5:54 PM. Always use your most recent med list.          albuterol 108 (90 Base) MCG/ACT inhaler Commonly known as:  PROVENTIL HFA;VENTOLIN HFA Inhale 2 puffs into the lungs every 6 (six) hours as needed for wheezing or shortness of breath.   carbidopa-levodopa 25-100 MG tablet Commonly known as:  SINEMET IR Take 2 tablets by mouth 3 (three) times daily.   HYDROcodone-acetaminophen 5-325 MG tablet Commonly known as:  NORCO/VICODIN Take 1-2 tablets by mouth every 4 (four) hours as  needed for severe pain.   ibuprofen 400 MG tablet Commonly known as:  ADVIL,MOTRIN Take 1.5 tablets (600 mg total) by mouth every 6 (six) hours as needed.   meclizine 25 MG tablet Commonly known as:  ANTIVERT Take 25 mg by mouth daily.   multivitamin with minerals tablet Take 1 tablet by mouth daily.   pramipexole 1 MG tablet Commonly known as:  MIRAPEX Take 2 tablets (2 mg total) by mouth 2 (two) times daily.   rasagiline 0.5 MG Tabs tablet Commonly known as:  AZILECT Take 1 tablet (0.5 mg total) by mouth daily.   VITAMIN D PO Take 1,000 Units by mouth daily.       Review of Systems  Constitutional: Negative for activity change, appetite change, chills, fatigue and fever.  HENT: Negative for congestion, rhinorrhea, sinus pain, sinus pressure, sneezing and sore throat.   Eyes: Negative.   Respiratory: Negative for cough, chest tightness, shortness of breath and wheezing.   Cardiovascular: Negative for chest pain, palpitations and leg swelling.  Gastrointestinal: Negative for abdominal pain, constipation, diarrhea, nausea and vomiting.  Endocrine: Negative.   Genitourinary: Negative for dysuria, flank pain, frequency and urgency.  Musculoskeletal: Positive for back pain and gait problem.  Skin: Negative for color change, pallor, rash and wound.  Neurological: Positive for tremors. Negative for dizziness, seizures, syncope and light-headedness.  Hematological: Does not bruise/bleed easily.  Psychiatric/Behavioral: Negative for agitation, confusion, hallucinations and sleep disturbance. The patient is not nervous/anxious.     Vitals:   11/04/16 1029  BP: (!) 156/81  Pulse: 74  Resp: 19  Temp: 98.4 F (36.9 C)  SpO2: 99%  Weight: 230 lb (104.3 kg)  Height: 5' 11.5" (1.816 m)   Body mass index is 31.63 kg/m. Physical Exam  Constitutional: He is oriented to person, place, and time. He appears well-developed and well-nourished. No distress.  HENT:  Head:  Normocephalic.  Mouth/Throat: Oropharynx is clear and moist. No oropharyngeal exudate.  Eyes: Conjunctivae and EOM are normal. Pupils are equal, round, and reactive to light. Right eye exhibits no discharge. Left eye exhibits no discharge. No scleral icterus.  Neck: Normal range of motion. No JVD present. No thyromegaly present.  Cardiovascular: Normal rate, regular rhythm, normal heart sounds and intact distal pulses.  Exam reveals no gallop and no friction rub.   No murmur heard. Pulmonary/Chest: Effort normal and breath sounds normal. No respiratory distress. He has no wheezes. He has no rales.  Abdominal: Soft. Bowel sounds are normal. He exhibits no distension. There is no tenderness. There is no rebound and no guarding.  Musculoskeletal: He exhibits no edema, tenderness or deformity.  Moves x 4 extremities. Decreased strength to lower extremities.   Lymphadenopathy:    He has no cervical adenopathy.  Neurological: He is oriented to person, place, and time.  Resting tremors on both hands  Skin: Skin is warm and dry. No rash noted. No erythema. No pallor.  Psychiatric: He has a normal mood and affect.    Labs reviewed: Basic Metabolic Panel:  Recent Labs  09/15/16 1854 10/15/16 1307 10/18/16 1909 10/22/16  NA 143 140 140 141  K 4.0 3.8 3.7 3.9  CL 104 108 108  --   CO2  --  23 23  --   GLUCOSE 64* 156* 94  --   BUN 13 17 18 14   CREATININE 1.10 1.13 1.22 0.9  CALCIUM  --  9.3 9.6  --    Liver Function Tests:  Recent Labs  10/22/16  AST 16  ALT 17  ALKPHOS 62   CBC:  Recent Labs  10/15/16 1307 10/18/16 1909 10/22/16  WBC 6.9 6.5 6.6  HGB 13.4 14.1 13.2*  HCT 40.3 41.5 39*  MCV 82.2 82.2  --   PLT 168 166 151   Assessment/Plan:   1. Unsteady gait   Has worked well with PT/ OT.Will discharge home PT/OT to continue with ROM, Exercise, Gait stability and muscle strengthening.No DME required has own FWW. Fall and safety precautions.   2. Parkinson's  disease Continue on Rasagiline 0.5 mg daily,pramipexole 2 mg twice daily and carbidopa-levodopa 25-100 mg 2 tablets three times daily. Continue to follow up with Neuro as directed.   3. Vitamin D deficiency Continue vit D supplements.   4. Vertigo Asymptomatic during rehab stay.continue on meclizine 25 mg tablet daily  5. Lumbago  Pain under control with current regimen. Continue on Hydrocodone/APAP 5/325 mg tablet 1-2 tablets every 4 hours as needed. Wean off narcotic as tolerated. Fall and safety precautions.   Patient is being discharged with the following home health services:   -PT/OT for ROM, exercise, gait stability and muscle strengthening  Patient is being discharged with the following durable medical equipment:   - None required  Patient has been advised to f/u with their PCP in 1-2 weeks to for a transitions of care visit.Social services at their facility was responsible for arranging this appointment.  Pt was provided with adequate prescriptions of noncontrolled medications to reach the scheduled appointment.For controlled substances, a limited supply was provided as appropriate for the individual patient. If the pt normally receives these medications from a pain clinic or has a contract with another physician, these medications should be received from that clinic or physician only).    Future labs/tests needed:  CBC, BMP in 1-2 weeks PCP

## 2016-11-05 DIAGNOSIS — H8110 Benign paroxysmal vertigo, unspecified ear: Secondary | ICD-10-CM | POA: Insufficient documentation

## 2016-11-05 HISTORY — DX: Benign paroxysmal vertigo, unspecified ear: H81.10

## 2016-11-08 ENCOUNTER — Other Ambulatory Visit: Payer: Self-pay | Admitting: *Deleted

## 2016-11-08 MED ORDER — RASAGILINE MESYLATE 0.5 MG PO TABS: 0.5000 mg | ORAL_TABLET | Freq: Every day | ORAL | 12 refills | Status: DC

## 2016-12-03 ENCOUNTER — Encounter: Payer: Self-pay | Admitting: Diagnostic Neuroimaging

## 2016-12-03 ENCOUNTER — Ambulatory Visit (INDEPENDENT_AMBULATORY_CARE_PROVIDER_SITE_OTHER): Payer: Medicare HMO | Admitting: Diagnostic Neuroimaging

## 2016-12-03 VITALS — BP 123/62 | HR 59 | Wt 240.0 lb

## 2016-12-03 DIAGNOSIS — G20 Parkinson's disease: Secondary | ICD-10-CM

## 2016-12-03 DIAGNOSIS — G4752 REM sleep behavior disorder: Secondary | ICD-10-CM

## 2016-12-03 DIAGNOSIS — M48062 Spinal stenosis, lumbar region with neurogenic claudication: Secondary | ICD-10-CM | POA: Diagnosis not present

## 2016-12-03 MED ORDER — RASAGILINE MESYLATE 0.5 MG PO TABS: 0.5000 mg | ORAL_TABLET | Freq: Every day | ORAL | 4 refills | Status: DC

## 2016-12-03 MED ORDER — CLONAZEPAM 0.5 MG PO TABS: 0.5000 mg | ORAL_TABLET | Freq: Every day | ORAL | 5 refills | Status: DC

## 2016-12-03 MED ORDER — PRAMIPEXOLE DIHYDROCHLORIDE 1 MG PO TABS: 2.0000 mg | ORAL_TABLET | Freq: Two times a day (BID) | ORAL | 4 refills | Status: DC

## 2016-12-03 MED ORDER — CARBIDOPA-LEVODOPA 25-100 MG PO TABS: 2.0000 | ORAL_TABLET | Freq: Three times a day (TID) | ORAL | 4 refills | Status: DC

## 2016-12-03 MED ORDER — PRAMIPEXOLE DIHYDROCHLORIDE 1 MG PO TABS: 2.0000 mg | ORAL_TABLET | Freq: Two times a day (BID) | ORAL | 0 refills | Status: DC

## 2016-12-03 NOTE — Progress Notes (Signed)
PATIENT: Theodore King DOB: 17-Nov-1945   REASON FOR VISIT: follow up HISTORY FROM: patient and GF Romie Minus)  Chief Complaint  Patient presents with  . Parkinson's disease    rm 7, sig other- Romie Minus, "need refill of Azilect; hospital- couldn't walk, went to rehab x 10 days"     HISTORY OF PRESENT ILLNESS:  UPDATE 12/03/16: Since last visit, had progressive decline in walking, increased back pain, increased right leg pain. Had multiple ER visits (May 2018), then admitted, then went to rehab ([place] x 8 days). Also had run out of rasagiline in April 2018. Also had missed appts here due to financial limitations.   UPDATE 08/18/15: Since last visit, has had decline in movement and walking in last 1 month. More bradykinesia, gait diff and tremors. Meds not working as well as before. More back pain.   UPDATE 02/17/15: RBD symptoms worsening; has stopped clonazepam because it is not working. Left groin pull is better. PD overall stable. No wearing off. Tremor stable. No falls.  UPDATE 08/13/14: Since last visit, doing well from PD standpoint. Tremor and gait stable. Some new left groin pulling sensation x 3 weeks, without reported injury. Also pain/weakness in legs with standing for long time.  UPDATE 01/29/14: Since last visit, doing well. Clonazepam is helping his RBD symptoms, but he would like to to increase from 1mg  qhs up to 1.5mg  qhs. Tremor is stable. Tolerating carb/levo and pramipexole.  UPDATE 07/06/13 (LL):  Patient's gf requesting sooner appt than 11/05/13, which is yearly f/u. Last OV was 10/26/12. Patient's girlfriend states that patient is having sleeping problems, moving and talking in his sleep.  This is causing patient concern, he is worried that he may accidentally hurt girlfriend during sleep.  He would like treatment. Otherwise no changes, minor tremor in hand, not bothersome. Tolerating medications well.  UPDATE 10/26/12: Patient is doing extremity well since last  visit. Increased carbidopa/levodopa dosing has essentially removed his tremors. He very rarely feels tremor in his left arm. Patient is competed in the senior games over past few months and won [employer] (including several gold). He won the [employer] after competing for the first time in his life. 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 Overall doing great.   UPDATE 05/10/12: Feels just a little better, continues with mild tremors. Denies any falls. He continues to work out 3-4 times per week. His only complaint today is he continues to feel embarassed with his tremors.   UPDATE 03/06/12: Feels like doing better with the Pramipixole. Continues to have mild tremor. Denies any falls. Works out 3-4 days per week. He does feel that his tremors can improve more to help him to not feel embarrased.   UPDATE 01/04/12: Couldn't afford meds in end of june and for last 3 weeks of June was taking less meds than rx'd. Tremor worsened. Now has insurance and getting better tremor control, but still present. Still with int drooling. No constipation. No wearing off. No dyskinesias. No constipation.   UPDATE 10/27/11: Doing about the same. Not much benefit with carb/levo. No side effects. Still with tremor (rest and action). No falls. Works out 3x per week. Recent gold medal at [employer].   PRIOR HPI: 71 year old left-handed male with no past medical history, here for evaluation of tremor. Since November 2012, patient has developed progressive tremor in his left hand greater than right hand. He also  feels slower with fine finger movements and coordination. His girlfriend is noticed short shuffling steps with walking. Symptoms have been progressive over last few months. Patient had an injury in 2012 with rotator cuff tear. He thinks that since that time his symptoms have been worse. He denies change in smell or taste, vivid dreams, hallucinations,  constipation or swallowing difficulty.    REVIEW OF SYSTEMS: Full 14 system review of systems performed and negative except: freq urination speech diff weakness restless legs joint pain speech diff weakness snoring back pain.    ALLERGIES: No Known Allergies  HOME MEDICATIONS: Outpatient Medications Prior to Visit  Medication Sig Dispense Refill  . albuterol (PROVENTIL HFA;VENTOLIN HFA) 108 (90 BASE) MCG/ACT inhaler Inhale 2 puffs into the lungs every 6 (six) hours as needed for wheezing or shortness of breath. 1 Inhaler 2  . carbidopa-levodopa (SINEMET IR) 25-100 MG tablet Take 2 tablets by mouth 3 (three) times daily. 540 tablet 4  . Cholecalciferol (VITAMIN D PO) Take 1,000 Units by mouth daily.     . pramipexole (MIRAPEX) 1 MG tablet Take 2 tablets (2 mg total) by mouth 2 (two) times daily. 120 tablet 0  . HYDROcodone-acetaminophen (NORCO/VICODIN) 5-325 MG tablet Take 1-2 tablets by mouth every 4 (four) hours as needed for severe pain. (Patient not taking: Reported on 12/03/2016) 16 tablet 0  . ibuprofen (ADVIL,MOTRIN) 400 MG tablet Take 1.5 tablets (600 mg total) by mouth every 6 (six) hours as needed. (Patient not taking: Reported on 12/03/2016) 30 tablet 0  . meclizine (ANTIVERT) 25 MG tablet Take 25 mg by mouth daily.    . Multiple Vitamins-Minerals (MULTIVITAMIN WITH MINERALS) tablet Take 1 tablet by mouth daily.    . rasagiline (AZILECT) 0.5 MG TABS tablet Take 1 tablet (0.5 mg total) by mouth daily. (Patient not taking: Reported on 12/03/2016) 30 tablet 12   No facility-administered medications prior to visit.     PAST MEDICAL HISTORY: Past Medical History:  Diagnosis Date  . Disorders of bursae and tendons in shoulder region, unspecified   . Other and unspecified hyperlipidemia   . Other and unspecified hyperlipidemia   . 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 Parkinson's disease ([place])   . Unspecified vitamin D deficiency   . Unspecified vitamin D deficiency       PAST SURGICAL HISTORY: Past Surgical History:  Procedure Laterality Date  . APPENDECTOMY    . ROTATOR CUFF REPAIR     right    FAMILY HISTORY: Family History  Problem Relation Age of Onset  . Cancer Mother   . Cancer Father   . Stroke Brother   . Cancer Sister   . Cancer Sister   . Colon cancer Neg Hx   . Esophageal cancer Neg Hx   . Rectal cancer Neg Hx   . Stomach cancer Neg Hx     SOCIAL HISTORY: Social History   Social History  . Marital status: Legally Separated    Spouse name: N/A  . Number of children: 3  . Years of education: 12th   Occupational History  .  Retired   Social History Main Topics  . Smoking status: Former Research scientist (life sciences)  . Smokeless tobacco: Never Used  . Alcohol use Yes     Comment: occasionally ("a sip every now and then")  . Drug use: No  . Sexual activity: No   Other Topics Concern  . Not on file   Social History Narrative  . No narrative on file  PHYSICAL EXAM  Vitals:   12/03/16 1005  BP: 123/62  Pulse: (!) 59  Weight: 240 lb (108.9 kg)   Body mass index is 33.01 kg/m.  Generalized: Well developed, in no acute distress  Neck: Supple, no carotid bruits  Cardiac: Regular rate rhythm, no murmur   Neurological examination  MENTAL STATUS: awake, alert, language fluent, comprehension intact, naming intact; MASKED FACIES. BORDERLINE MYERSONS.  CRANIAL NERVE: pupils equal and reactive to light, visual fields full to confrontation, extraocular muscles intact, no nystagmus, facial sensation and strength symmetric, uvula midline, shoulder shrug symmetric, tongue midline. HOARSE VOICE  MOTOR: NO TREMOR. NO RIGIDITY. MILD BRADYKINESIA IN BLE > BUE. Normal bulk and tone, full strength in the BUE, BLE.  SENSORY: normal and symmetric to light touch.  COORDINATION: finger-nose-finger, fine finger movements normal  REFLEXES: deep tendon reflexes present and symmetric  GAIT/STATION: SLIGHTLY STOOPED POSTURE. DECR ARM LEFT ARM SWING.  ABLE TO WALK INDEPENDENTLY.    DIAGNOSTICS  Lab Results  Component Value Date   WBC 6.6 10/22/2016   HGB 13.2 (A) 10/22/2016   HCT 39 (A) 10/22/2016   MCV 82.2 10/18/2016   PLT 151 10/22/2016   CMP Latest Ref Rng & Units 10/22/2016 10/18/2016 10/15/2016  Glucose 65 - 99 mg/dL - 94 156(H)  BUN 4 - 21 mg/dL 14 18 17   Creatinine 0.6 - 1.3 mg/dL 0.9 1.22 1.13  Sodium 137 - 147 mmol/L 141 140 140  Potassium 3.4 - 5.3 mmol/L 3.9 3.7 3.8  Chloride 101 - 111 mmol/L - 108 108  CO2 22 - 32 mmol/L - 23 23  Calcium 8.9 - 10.3 mg/dL - 9.6 9.3  Alkaline Phos 25 - 125 U/L 62 - -  AST 14 - 40 U/L 16 - -  ALT 10 - 40 U/L 17 - -    09/09/14 MRI lumbar [I reviewed images myself and agree with interpretation. -VRP]  1. At L4-5: facet hypertrophy, left facet bone spurring, disc bulging with mild spinal stenosis and mild biforaminal stenosis; left lateral recess stenosis noted as well. 2. At L5-S1: facet hypertrophy and spondylitic spurring with mild biforaminal stenosis. 3. Multi-level degenerative changes from L3-4 to L5-S1, including mild endplate marrow edema at L4-5 on degenerative basis. 4. Sacralization of L5 with transitional features. If surgical/procedural intervention   10/19/16 MRI lumbar [I reviewed images myself and disagree with interpretation. Overall stable imaging, but there is mild spinal stenosis at L4-5. -VRP]  1. No acute abnormality of the lumbar spine. 2. Moderate bilateral L5-S1 neural foraminal stenosis, unchanged. 3. Bilateral mild L4-L5 foraminal stenosis, left slightly worse than right, unchanged. 4. No spinal canal stenosis. 5. Re-demonstration of transitional lumbosacral anatomy.   ASSESSMENT AND PLAN 71 y.o. male with progressive resting tremor, bradykinesia, cogwheel rigidity, postural instability, consistent with parkinson's disease (with favorable response to carb/levo and pramipexole). Also with REM behavior sleep disorder sxs.  Now with progression of PD and  lumbar spine symptoms in 2017-2018.   Dx:  Parkinson's disease ([place])  Spinal stenosis, lumbar region, with neurogenic claudication  REM sleep behavior disorder    PLAN:   PARKINSON'S DISEASE (established problem, worsening) - continue carbidopa/levodopa 25/100mg  to 2 tabs three times per day - continue pramipexole 2mg  twice a day - continue rasagiline 0.5mg  daily - discussed DBS options; patient will think about it, but not ready to try at this time due to financial and logistics considerations.  REM BEHAVIOR SLEEP DISORDER (established problem, worsening) - trial of clonazepam 0.5mg  at bedtime  LUMBAR  DEGENERATIVE SPINE, LUMBAR SPINAL STENOSIS, LUMBAR RADICULOPATHY (established problem, worsening) - consider pain mgmt referral for consideration of epidural steroid injection and other therapies - use cane or rollator walker as needed  Meds ordered this encounter  Medications  . rasagiline (AZILECT) 0.5 MG TABS tablet    Sig: Take 1 tablet (0.5 mg total) by mouth daily.    Dispense:  90 tablet    Refill:  4  . DISCONTD: pramipexole (MIRAPEX) 1 MG tablet    Sig: Take 2 tablets (2 mg total) by mouth 2 (two) times daily.    Dispense:  120 tablet    Refill:  0  . carbidopa-levodopa (SINEMET IR) 25-100 MG tablet    Sig: Take 2 tablets by mouth 3 (three) times daily.    Dispense:  540 tablet    Refill:  4  . pramipexole (MIRAPEX) 1 MG tablet    Sig: Take 2 tablets (2 mg total) by mouth 2 (two) times daily.    Dispense:  360 tablet    Refill:  4  . clonazePAM (KLONOPIN) 0.5 MG tablet    Sig: Take 1 tablet (0.5 mg total) by mouth at bedtime.    Dispense:  30 tablet    Refill:  5   Return in about 3 months (around 03/05/2017).     Penni Bombard, MD 4/70/9295, 74:73 AM Certified in Neurology, Neurophysiology and Neuroimaging  [facility] [address], [place], [place] [address] [phone]

## 2017-02-23 ENCOUNTER — Telehealth: Payer: Self-pay | Admitting: Diagnostic Neuroimaging

## 2017-02-23 NOTE — Telephone Encounter (Signed)
Pt's "friend"(she is not on DPR but was at the last OV with him) called request refill for rasagiline (AZILECT) 0.5 MG TABS tablet to be sent to [facility]. Pt has been out of the medication for 3 weeks.

## 2017-02-24 MED ORDER — RASAGILINE MESYLATE 0.5 MG PO TABS: 0.5000 mg | ORAL_TABLET | Freq: Every day | ORAL | 3 refills | Status: DC

## 2017-02-24 NOTE — Telephone Encounter (Signed)
Done

## 2017-03-30 ENCOUNTER — Ambulatory Visit: Payer: Medicare HMO | Admitting: Diagnostic Neuroimaging

## 2017-03-30 ENCOUNTER — Telehealth: Payer: Self-pay

## 2017-03-30 NOTE — Telephone Encounter (Signed)
Patient no showed his OV with Dr. [name] 03/30/17

## 2017-03-31 ENCOUNTER — Encounter: Payer: Self-pay | Admitting: Diagnostic Neuroimaging

## 2017-04-04 ENCOUNTER — Telehealth: Payer: Self-pay | Admitting: Diagnostic Neuroimaging

## 2017-04-04 NOTE — Telephone Encounter (Addendum)
Note: patient was no show for follow up on 03/30/17.  Attempted to reach patient to discuss call from Ms Evette Doffing. Phone immediately rang busy. Per Dr Gladstone Lighter last office note, for his lumbar issues, the patient was to consider pain management referral for consideration of epidural steroid injection and other therapies. He is to use a cane or rollator walker as needed. Will attempt to reach patient at later time.

## 2017-04-04 NOTE — Telephone Encounter (Signed)
Pt friend(Ms Evette Doffing, not on DPR) calling asking if there is any way for pt to be seen before March due to pt not being able to properly move.  Pt wife also states she does not feel pt medication is not working. If there is a way for pt to be seen before March 19th, please call pt @ [phone].  Ms Evette Doffing has already been made aware of not being on [facility]

## 2017-04-05 NOTE — Telephone Encounter (Signed)
LVM on patient's mobile # advising him this office received a call yesterday from Ms Evette Doffing. Advised this office cannot speak with her because she is not listed on his DPR. Advised if he would like to discuss her call, please call this RN; left number.

## 2017-05-08 ENCOUNTER — Emergency Department (HOSPITAL_COMMUNITY): Payer: Medicare HMO

## 2017-05-08 ENCOUNTER — Emergency Department (HOSPITAL_COMMUNITY)
Admission: EM | Admit: 2017-05-08 | Discharge: 2017-05-09 | Disposition: A | Discharge status: Home / Self Care | Payer: Medicare HMO | Attending: Emergency Medicine | Admitting: Emergency Medicine

## 2017-05-08 ENCOUNTER — Encounter (HOSPITAL_COMMUNITY): Payer: Self-pay

## 2017-05-08 DIAGNOSIS — G20 Parkinson's disease: Secondary | ICD-10-CM | POA: Diagnosis not present

## 2017-05-08 DIAGNOSIS — Z87891 Personal history of nicotine dependence: Secondary | ICD-10-CM | POA: Insufficient documentation

## 2017-05-08 DIAGNOSIS — Z79899 Other long term (current) drug therapy: Secondary | ICD-10-CM | POA: Diagnosis not present

## 2017-05-08 DIAGNOSIS — M62838 Other muscle spasm: Secondary | ICD-10-CM | POA: Diagnosis present

## 2017-05-08 LAB — BASIC METABOLIC PANEL
Anion gap: 5 (ref 5–15)
BUN: 15 mg/dL (ref 6–20)
CO2: 28 mmol/L (ref 22–32)
Calcium: 9.4 mg/dL (ref 8.9–10.3)
Chloride: 106 mmol/L (ref 101–111)
Creatinine, Ser: 1.09 mg/dL (ref 0.61–1.24)
GFR calc Af Amer: >60 mL/min (ref >60)
GFR calc non Af Amer: >60 mL/min (ref >60)
Glucose, Bld: 101 mg/dL — ABNORMAL HIGH (ref 65–99)
Potassium: 4.2 mmol/L (ref 3.5–5.1)
Sodium: 139 mmol/L (ref 135–145)

## 2017-05-08 LAB — CBC
HCT: 43.2 % (ref 39.0–52.0)
Hemoglobin: 14.4 g/dL (ref 13.0–17.0)
MCH: 27.9 pg (ref 26.0–34.0)
MCHC: 33.3 g/dL (ref 30.0–36.0)
MCV: 83.7 fL (ref 78.0–100.0)
Platelets: 137 10*3/uL — ABNORMAL LOW (ref 150–400)
RBC: 5.16 MIL/uL (ref 4.22–5.81)
RDW: 14.1 % (ref 11.5–15.5)
WBC: 5.9 10*3/uL (ref 4.0–10.5)

## 2017-05-08 LAB — BRAIN NATRIURETIC PEPTIDE: B Natriuretic Peptide: 6 pg/mL (ref 0.0–100.0)

## 2017-05-08 MED ORDER — PRAMIPEXOLE DIHYDROCHLORIDE 1 MG PO TABS
2.0000 mg | ORAL_TABLET | Freq: Two times a day (BID) | ORAL | Status: DC
Start: 2017-05-08 — End: 2017-05-10
  Administered 2017-05-08 – 2017-05-09 (×2): 2 mg via ORAL
  Filled 2017-05-08 (×3): qty 2

## 2017-05-08 MED ORDER — RASAGILINE MESYLATE 0.5 MG PO TABS
0.5000 mg | ORAL_TABLET | Freq: Every day | ORAL | Status: DC
  Administered 2017-05-08 – 2017-05-09 (×2): 0.5 mg via ORAL
  Filled 2017-05-08 (×2): qty 1

## 2017-05-08 MED ORDER — CLONAZEPAM 0.5 MG PO TABS
0.5000 mg | ORAL_TABLET | Freq: Every day | ORAL | Status: DC
  Administered 2017-05-08: 0.5 mg via ORAL
  Filled 2017-05-08: qty 1

## 2017-05-08 MED ORDER — METHOCARBAMOL 500 MG PO TABS
500.0000 mg | ORAL_TABLET | Freq: Once | ORAL | Status: AC
  Administered 2017-05-08: 500 mg via ORAL
  Filled 2017-05-08: qty 1

## 2017-05-08 MED ORDER — CARBIDOPA-LEVODOPA 25-100 MG PO TABS
2.0000 | ORAL_TABLET | Freq: Three times a day (TID) | ORAL | Status: DC
  Administered 2017-05-08 – 2017-05-09 (×2): 2 via ORAL
  Filled 2017-05-08 (×5): qty 2

## 2017-05-08 NOTE — ED Notes (Signed)
Pt transferred over to hospital bed.  

## 2017-05-08 NOTE — ED Provider Notes (Cosign Needed)
Theodore King Provider Note   CSN: [id number] Arrival date & time: 05/08/17  4627     History   Chief Complaint Chief Complaint  Patient presents with  . Spasms    HPI Theodore King is a 70 y.o. male with past medical history significant for Parkinson's disease presenting with 3-4 months of leg spasm/ pain which he reports has been getting worse over the last couple of days.  He reports that it is worse when he attempts to put weight on it and has tremors which make it difficult for him to ambulate.  States that the pain is from his hip to his knee anterior and posterior.  He has been followed by Neurology and seen by PCP about this wand  prescribed baclofen at the last visit. He started taking it 2 days ago and has not noticed improvement yet. He has not taken his medications today.  HPI  Past Medical History:  Diagnosis Date  . Disorders of bursae and tendons in shoulder region, unspecified   . Other and unspecified hyperlipidemia   . Other and unspecified hyperlipidemia   . 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 Parkinson's disease ([place])   . Unspecified vitamin D deficiency   . Unspecified vitamin D deficiency     Patient Active Problem List   Diagnosis Date Noted  . Vertigo, benign paroxysmal 11/05/2016  . Abnormal dreams 09/06/2013  . 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 Hyperglycemia 05/18/2013  . Paralysis agitans ([place]) 05/18/2013  . Vitamin D deficiency 05/18/2013  . Hyperlipidemia LDL goal < 100 05/18/2013  . Parkinson's disease ([place]) 10/26/2012    Past Surgical History:  Procedure Laterality Date  . APPENDECTOMY    . ROTATOR CUFF REPAIR     right       Home Medications    Prior to Admission medications   Medication Sig Start Date End Date Taking? Authorizing Provider  albuterol (PROVENTIL HFA;VENTOLIN HFA) 108 (90 BASE) MCG/ACT inhaler Inhale 2 puffs into the lungs every 6 (six) hours  as needed for wheezing or shortness of breath. 07/04/13  Yes Lauree Chandler, NP  baclofen (LIORESAL) 20 MG tablet Take 20 mg by mouth 4 (four) times daily.   Yes [provider]  carbidopa-levodopa (SINEMET IR) 25-100 MG tablet Take 2 tablets by mouth 3 (three) times daily. 12/03/16  Yes Penumalli, Earlean Polka, MD  Cholecalciferol (VITAMIN D PO) Take 1,000 Units by mouth daily.    Yes [provider]  clonazePAM (KLONOPIN) 0.5 MG tablet Take 1 tablet (0.5 mg total) by mouth at bedtime. 12/03/16  Yes Penumalli, Earlean Polka, MD  meclizine (ANTIVERT) 25 MG tablet Take 25 mg by mouth daily as needed for dizziness.  05/11/16  Yes [provider]  Multiple Vitamins-Minerals (MULTIVITAMIN WITH MINERALS) tablet Take 1 tablet by mouth daily.   Yes [provider]  pramipexole (MIRAPEX) 1 MG tablet Take 2 tablets (2 mg total) by mouth 2 (two) times daily. 12/03/16  Yes Penumalli, Earlean Polka, MD  rasagiline (AZILECT) 0.5 MG TABS tablet Take 1 tablet (0.5 mg total) by mouth daily. 02/24/17  Yes Penumalli, Earlean Polka, MD  HYDROcodone-acetaminophen (NORCO/VICODIN) 5-325 MG tablet Take 1-2 tablets by mouth every 4 (four) hours as needed for severe pain. Patient not taking: Reported on 12/03/2016 09/05/16   Margarita Mail, PA-C  ibuprofen (ADVIL,MOTRIN) 400 MG tablet Take 1.5 tablets (600 mg total) by mouth every 6 (six) hours as needed. Patient not taking:  Reported on 12/03/2016 10/15/16   Frederica Kuster, PA-C    Family History Family History  Problem Relation Age of Onset  . Cancer Mother   . Cancer Father   . Stroke Brother   . Cancer Sister   . Cancer Sister   . Colon cancer Neg Hx   . Esophageal cancer Neg Hx   . Rectal cancer Neg Hx   . Stomach cancer Neg Hx     Social History Social History   Tobacco Use  . Smoking status: Former Research scientist (life sciences)  . Smokeless tobacco: Never Used  Substance Use Topics  . Alcohol use: Yes    Comment: occasionally ("a sip every now and then")  .  Drug use: No     Allergies   Patient has no known allergies.   Review of Systems Review of Systems  Constitutional: Negative for chills and fever.  Respiratory: Negative for cough, shortness of breath, wheezing and stridor.   Cardiovascular: Negative for chest pain, palpitations and leg swelling.  Gastrointestinal: Negative for abdominal pain, nausea and vomiting.       No loss of bowel function  Genitourinary: Negative for difficulty urinating, dysuria, flank pain, frequency and hematuria.       No loss of bladder function  Musculoskeletal: Positive for gait problem and myalgias. Negative for arthralgias, back pain, joint swelling, neck pain and neck stiffness.  Skin: Negative for color change, pallor, rash and wound.  Neurological: Positive for tremors. Negative for dizziness, seizures, syncope, facial asymmetry, speech difficulty, light-headedness, numbness and headaches.     Physical Exam Updated Vital Signs BP 135/73   Pulse 74   Temp 98 F (36.7 C) (Oral)   Resp 19   SpO2 98%   Physical Exam  Constitutional: He appears well-developed and well-nourished. No distress.  Afebrile, nontoxic, lying comfortably in bed in no acute distress  HENT:  Head: Normocephalic and atraumatic.  Eyes: Conjunctivae and EOM are normal.  Neck: Normal range of motion. Neck supple.  Cardiovascular: Normal rate, regular rhythm, normal heart sounds and intact distal pulses.  No murmur heard. Pulmonary/Chest: Effort normal and breath sounds normal. No stridor. No respiratory distress. He has no wheezes. He has no rales.  Musculoskeletal: Normal range of motion. He exhibits no edema, tenderness or deformity.  No ttp of lower extremities bilaterally, no edema, full rom.  Neurological: He is alert. No sensory deficit. He exhibits normal muscle tone.  4/5 strength to lower extremities bilaterally with poor effort  Skin: Skin is warm and dry. No rash noted. He is not diaphoretic. No erythema. No  pallor.  Psychiatric: He has a normal mood and affect.  Nursing note and vitals reviewed.    ED Treatments / Results  Labs (all labs ordered are listed, but only abnormal results are displayed) Labs Reviewed  BASIC METABOLIC PANEL - Abnormal; Notable for the following components:      Result Value   Glucose, Bld 101 (*)    All other components within normal limits  CBC - Abnormal; Notable for the following components:   Platelets 137 (*)    All other components within normal limits  BRAIN NATRIURETIC PEPTIDE    EKG  EKG Interpretation  Date/Time:  Sunday May 08 2017 11:34:31 EST Ventricular Rate:  73 PR Interval:    QRS Duration: 96 QT Interval:  376 QTC Calculation: 415 R Axis:   15 Text Interpretation:  Sinus rhythm Atrial premature complexes Prolonged PR interval No significant change since last tracing Confirmed  by Addison Lank [phone]) on 05/08/2017 3:12:11 PM       Radiology Dg Chest 2 View  Result Date: 05/08/2017 CLINICAL DATA:  Dry cough for 2 days EXAM: CHEST  2 VIEW COMPARISON:  None. FINDINGS: Mild elevation of the left hemidiaphragm. Cardiomegaly. Left base atelectasis. No effusions or edema. No acute bony abnormality. IMPRESSION: Cardiomegaly. Mild elevation of the left hemidiaphragm with left base atelectasis. Electronically Signed   By: Rolm Baptise M.D.   On: 05/08/2017 14:09    Procedures Procedures (including critical care time)  Medications Ordered in ED Medications  carbidopa-levodopa (SINEMET IR) 25-100 MG per tablet immediate release 2 tablet (not administered)  clonazePAM (KLONOPIN) tablet 0.5 mg (not administered)  pramipexole (MIRAPEX) tablet 2 mg (not administered)  rasagiline (AZILECT) tablet 0.5 mg (not administered)  methocarbamol (ROBAXIN) tablet 500 mg (500 mg Oral Given 05/08/17 1804)     Initial Impression / Assessment and Plan / ED Course  I have reviewed the triage vital signs and the nursing notes.  Pertinent labs &  imaging results that were available during my care of the patient were reviewed by me and considered in my medical decision making (see chart for details).    During exam patient reporting that he doesn't have pain but more of a trembling tremor when putting weight on his right leg. No resting tremor. No ttp.   Reviewed Neurology notes from Dr. [name] (last visit 12/03/16) Describing progression of disease and worsening symptoms with right leg pain and low back pain as well as increased difficulties with ambulation.  Patient also has history of noncompliance with medications and poor reliability for follow up appointments.  Patient lives with family member but she is at school during the day.  Consulted case management for assistance with patient disposition and possibly home health.   Will need PT consult to evaluate patient's mobility support needs.  Patient appears to be in denial related to disease progression and may have unrealistic expectations regarding treatment.  I had a long discussion with patient regarding the progressive nature of his disease and likely requirement for increased assistance in the near future. Family appear unable to care for patient during the day at home.  Patient discussed with Dr. Leonette Monarch who has also seen patient and agrees with assessment and plan. Family member mentionned to him that he had been experiencing a dry cough over the last month. CXR negative for pneumonia or infiltrate. Negative BNP. EKG unchanged. Labs unremarkable.  Reordered patient's home medications while in ED. Patient was non-compliant with his medications today as he wanted to come to the ED and did not take any medications prior to arrival.  Transferred patient care at end of shift to Dr Johnney Killian and Wyn Quaker, PA-C pending PT eval, case management and social work input for disposition.  Final Clinical Impressions(s) / ED Diagnoses   Final diagnoses:  Parkinson's disease  (tremor, stiffness, slow motion, unstable posture) [facility])    ED Discharge Orders    None       Alexandru, [place] 05/08/17 2009

## 2017-05-08 NOTE — ED Notes (Signed)
Dinner tray provided

## 2017-05-08 NOTE — ED Notes (Signed)
Hospital bed ordered for pt.

## 2017-05-08 NOTE — ED Triage Notes (Signed)
Patient complains of 2 weeks of right leg muscles, has seen MD for same and took meds as prescribed with no relief. This am reports difficulty ambulating, alert and oriented, denies trauma

## 2017-05-08 NOTE — ED Notes (Signed)
Pt used urinal at 14:32 and a UA was collected just in case.

## 2017-05-08 NOTE — Care Management Note (Signed)
Case Management Note  Patient Details  Name: Theodore King MRN: [id number] Date of Birth: 06-Feb-1946  Subjective/Objective: CM received consult for this 71 y.o. M who suffers from Parkinson's Dz  And has had difficulty walking today. Tells me he was in the hospital in June of this yr, received steroids and went to [facility] for [employer]. Girlfriend at bedside says he "just cannot walk" and needs rehab. PA-C finds no acute issue warranting admit but has ordered PT eval to direct care; [place] vs SNF. CM spoke frankly to pt and GF about need to begin to plan for long term effects of Dz process. Both agree yet have made no plans.CM suggested assist of family, friends, faith community to assist with this patient until more permanent plans can be put into place. Will hand off to [facility] CM. Alerted CSW to possible placement needs.                 Action/Plan:CM will sign off for now but will be available should additional discharge needs arise or disposition change.    Expected Discharge Date:                  Expected Discharge Plan:     In-House Referral:  Clinical Social Work  Discharge planning Services  CM Consult  Post Acute Care Choice:  Durable Medical Equipment, Home Health Choice offered to:  Patient(Girlfriend at Bedside)  DME Arranged:    DME Agency:     HH Arranged:    [place] Agency:     Status of Service:  In process, will continue to follow  If discussed at Long Length of Stay Meetings, dates discussed:    Additional Comments:  Delrae Sawyers, RN 05/08/2017, 4:21 PM

## 2017-05-08 NOTE — ED Provider Notes (Signed)
Patient non-complaint parkinsons, disease progression, social issues, boarder,  [id number] ordered, Case management has seen him.   Clinical Course as of May 10 611  Nancy Fetter May 08, 2017  2255 I introduced myself to patient, and explained my role in his care.  He is currently resting comfortably in no distress.  No needs at this time.  Will continue to monitor.   [EH]  Mon May 09, 2017  0553 Patient re-evaluated, he notes significant improvement in his symptoms and is better able to lift his legs, most likely to being re-started on his home meds.   [EH]    Clinical Course User Index [EH] Lorin Glass, PA-C     Patient is here for progression of his parkinson's and non compliance with medications.  His home meds were ordered and re-started by Avie Echevaria PA-C, see her note for full history and physical. Patient was monitored while in the department.  He had no needs or no complaints.  At time of shift change he was resting comfortably, awake and alert with his motor function reportedly significantly improved from when he came in.  He is awake and alert and able to carry on a normal conversation.  No obvious respiratory distress.  Patient will be signed out to [employer].  He is pending PT evaluation.  No unaddressed needs were discovered over night.  Patient remained hemodynamically stable.     Lorin Glass, PA-C 05/09/17 3254    Charlesetta Shanks, MD 05/12/17 [phone]

## 2017-05-09 MED ORDER — CARBIDOPA-LEVODOPA 25-100 MG PO TABS
2.0000 | ORAL_TABLET | Freq: Three times a day (TID) | ORAL | Status: DC
  Filled 2017-05-09: qty 2

## 2017-05-09 NOTE — Evaluation (Signed)
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  Clinical Impression   Pt presents with above. Pt currently with functional limitations due to the deficits listed below (see PT Problem List). 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       Follow Up Recommendations SNF;Supervision/Assistance - 24 hour;Other (comment)(Has had good results at [facility])    Equipment Recommendations  Rolling walker with 5" wheels;3in1 (PT)    Recommendations for Other Services OT consult     Precautions / Restrictions Precautions Precautions: Fall      Mobility  Bed Mobility Overal bed mobility: Needs Assistance Bed Mobility: Supine to Sit     Supine to sit: Mod assist     General bed mobility comments: Very slow moving; mod assist, handheld assist to pull to sit; once sitting, noted difficulty reciprocally scooting to EOB; very inefficient scoot with decr ability to weight shift smoothly  Transfers Overall transfer level: Needs assistance Equipment used:  Rolling walker (2 wheeled) Transfers: Sit to/from Stand Sit to Stand: +2 physical assistance;Mod assist         General transfer comment: Difficulty moving feet to directly underneath himself in prep for standing up; Heavy mod assist to power up; slow anterior weight shift  Ambulation/Gait Ambulation/Gait assistance: +2 safety/equipment;Mod assist Ambulation Distance (Feet): 5 Feet Assistive device: Rolling walker (2 wheeled) Gait Pattern/deviations: Trunk flexed;Festinating     General Gait Details: Decr weight shift fully into single limb stance R and L resulting in short, staccato steps and festination  Stairs            Wheelchair Mobility    Modified Rankin (Stroke Patients Only)       Balance Overall balance assessment: Needs assistance Sitting-balance support: Bilateral upper extremity supported;No upper extremity supported Sitting balance-Leahy Scale: Fair       Standing balance-Leahy Scale: Poor                               Pertinent Vitals/Pain Pain Assessment: No/denies pain    Home Living Family/patient expects to be discharged to:: Private residence Living Arrangements: Children Available Help at Discharge: Family;Friend(s);Available PRN/intermittently Type of Home: House Home Access: Level entry     Home Layout: One level Home Equipment: Walker - 2 wheels      Prior Function Level of Independence: Independent         Comments: Usually doesn't need RW, but has one     Hand Dominance        Extremity/Trunk Assessment   Upper Extremity Assessment Upper Extremity Assessment: (Bradykinetic)    Lower Extremity Assessment Lower Extremity Assessment: Generalized weakness(Bilaterally with  bradykinetic, uncoordinated movements)       Communication   Communication: Expressive difficulties(slow to respond)  Cognition Arousal/Alertness: Awake/alert Behavior During Therapy: WFL for tasks assessed/performed Overall  Cognitive Status: Within Functional Limits for tasks assessed                                        General Comments      Exercises     Assessment/Plan    PT Assessment Patient needs continued PT services  PT Problem List Decreased strength;Decreased range of motion;Decreased activity tolerance;Decreased balance;Decreased mobility;Decreased coordination;Decreased knowledge of use of DME;Decreased safety awareness;Decreased knowledge of precautions;Impaired tone       PT Treatment Interventions DME instruction;Gait training;Functional mobility training;Therapeutic activities;Therapeutic exercise;Balance training;Neuromuscular re-education;Cognitive remediation;Patient/family education    PT Goals (Current goals can be found in the Care Plan section)  Acute Rehab PT Goals Patient Stated Goal: Wants to walk better and be safe and independent at home PT Goal Formulation: With patient Time For Goal Achievement: 05/23/17 Potential to Achieve Goals: Good    Frequency Min 3X/week   Barriers to discharge Decreased caregiver support Is at home alone large portions of the day    Co-evaluation               AM-PAC PT "6 Clicks" Daily Activity  Outcome Measure Difficulty turning over in bed (including adjusting bedclothes, sheets and blankets)?: A Lot Difficulty moving from lying on back to sitting on the side of the bed? : Unable Difficulty sitting down on and standing up from a chair with arms (e.g., wheelchair, bedside commode, etc,.)?: Unable Help needed moving to and from a bed to chair (including a wheelchair)?: A Lot Help needed walking in hospital room?: A Lot Help needed climbing 3-5 steps with a railing? : Total 6 Click Score: 9    End of Session Equipment Utilized During Treatment: Gait belt Activity Tolerance: Patient tolerated treatment well Patient left: in chair;with call bell/phone within reach;with family/visitor present Nurse Communication:  Mobility status PT Visit Diagnosis: Unsteadiness on feet (R26.81);Other abnormalities of gait and mobility (R26.89);Muscle weakness (generalized) (M62.81)    Time: 8341-9622 PT Time Calculation (min) (ACUTE ONLY): 28 min   Charges:   PT Evaluation $PT Eval Moderate Complexity: 1 Mod PT Treatments $Gait Training: 8-22 mins   PT G Codes:        Roney Marion, PT  Acute Rehabilitation Services Pager [phone] Office [phone]   Colletta [place] 05/09/2017, 11:37 AM

## 2017-05-09 NOTE — ED Notes (Signed)
Pt able to stand with one assist to use urinal, tolerated well.

## 2017-05-09 NOTE — ED Notes (Signed)
Pt eating dinner at this time

## 2017-05-09 NOTE — ED Provider Notes (Signed)
Bed available abdomen pelvis.  Patient brought back down to the emergency department and upon my reassessment he is hemodynamically stable to be  discharge and taken to [facility].  His daughter is here for transportation.   Fatima Blank, MD 05/09/17 2026

## 2017-05-09 NOTE — ED Notes (Signed)
Pt stea that he is ready t return to bed. Assisted to bed.

## 2017-05-09 NOTE — ED Notes (Signed)
Pt able to use bedside toilet, tolerated well with one assist.

## 2017-05-09 NOTE — Clinical Social Work Note (Signed)
Clinical Social Worker facilitated patient discharge including contacting patient family and facility to confirm patient discharge plans.  Clinical information faxed to facility and family agreeable with plan.  RN arranged transportation with patient daughter to [place].  RN to call report prior to discharge.  Clinical Social Worker will sign off for now as social work intervention is no longer needed. Please consult [place] again if new need arises.  Barbette Or, [place]

## 2017-05-09 NOTE — ED Notes (Signed)
Patient assisted with dinner tray, urinal emptied, bedside cleaning done

## 2017-05-09 NOTE — ED Notes (Signed)
Pt able to stand and use urinal with one assist, tolerated well.

## 2017-05-09 NOTE — NC FL2 (Signed)
[place] LEVEL OF CARE SCREENING TOOL     IDENTIFICATION  Patient Name: Theodore King Birthdate: 07-10-1945 Sex: male Admission Date (Current Location): 05/08/2017  [facility] and [place] Number:  Herbalist and Address:  [facility], [place] [address] [place], [place] [address]      Provider Number: [id number]  Attending Physician Name and Address:  Default, Provider, MD  Relative Name and Phone Number:       Current Level of Care: Hospital Recommended Level of Care: [place] Prior Approval Number:    Date Approved/Denied:   PASRR Number: [id number]  Discharge Plan: SNF    Current Diagnoses: Patient Active Problem List   Diagnosis Date Noted  . Vertigo, benign paroxysmal 11/05/2016  . Abnormal dreams 09/06/2013  . 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 Hyperglycemia 05/18/2013  . Paralysis agitans ([place]) 05/18/2013  . Vitamin D deficiency 05/18/2013  . Hyperlipidemia LDL goal < 100 05/18/2013  . Parkinson's disease ([place]) 10/26/2012    Orientation RESPIRATION BLADDER Height & Weight     Self, Time, Situation, Place  Normal Continent Weight:   Height:     BEHAVIORAL SYMPTOMS/MOOD NEUROLOGICAL BOWEL NUTRITION STATUS      Continent Diet  AMBULATORY STATUS COMMUNICATION OF NEEDS Skin   Limited Assist Verbally Normal                       Personal Care Assistance Level of Assistance  Bathing, Dressing Bathing Assistance: Limited assistance   Dressing Assistance: Limited assistance     Functional Limitations Info             SPECIAL CARE FACTORS FREQUENCY  PT (By licensed PT), OT (By licensed OT)     PT Frequency: 5/wk OT Frequency: 5/wk            Contractures      Additional Factors Info  Code Status, Allergies, Psychotropic Code Status Info: FULL Allergies Info: NKA Psychotropic Info: klonopin         Current Medications (05/09/2017):  This is the current  hospital active medication list Current Facility-Administered Medications  Medication Dose Route Frequency Provider Last Rate Last Dose  . carbidopa-levodopa (SINEMET IR) 25-100 MG per tablet immediate release 2 tablet  2 tablet Oral TID Avie Echevaria B, PA-C   2 tablet at 05/08/17 2123  . clonazePAM (KLONOPIN) tablet 0.5 mg  0.5 mg Oral QHS Jolon, [place] B, PA-C   0.5 mg at 05/08/17 2123  . pramipexole (MIRAPEX) tablet 2 mg  2 mg Oral BID Garvey, [place] B, PA-C   2 mg at 05/08/17 2122  . rasagiline (AZILECT) tablet 0.5 mg  0.5 mg Oral Daily Kaysan, [place] B, PA-C   0.5 mg at 05/08/17 2778   Current Outpatient Medications  Medication Sig Dispense Refill  . albuterol (PROVENTIL HFA;VENTOLIN HFA) 108 (90 BASE) MCG/ACT inhaler Inhale 2 puffs into the lungs every 6 (six) hours as needed for wheezing or shortness of breath. 1 Inhaler 2  . baclofen (LIORESAL) 20 MG tablet Take 20 mg by mouth 4 (four) times daily.    . carbidopa-levodopa (SINEMET IR) 25-100 MG tablet Take 2 tablets by mouth 3 (three) times daily. 540 tablet 4  . Cholecalciferol (VITAMIN D PO) Take 1,000 Units by mouth daily.     . clonazePAM (KLONOPIN) 0.5 MG tablet Take 1 tablet (0.5 mg total) by mouth at bedtime. 30 tablet 5  . meclizine (ANTIVERT) 25  MG tablet Take 25 mg by mouth daily as needed for dizziness.     . Multiple Vitamins-Minerals (MULTIVITAMIN WITH MINERALS) tablet Take 1 tablet by mouth daily.    . pramipexole (MIRAPEX) 1 MG tablet Take 2 tablets (2 mg total) by mouth 2 (two) times daily. 360 tablet 4  . rasagiline (AZILECT) 0.5 MG TABS tablet Take 1 tablet (0.5 mg total) by mouth daily. 90 tablet 3  . HYDROcodone-acetaminophen (NORCO/VICODIN) 5-325 MG tablet Take 1-2 tablets by mouth every 4 (four) hours as needed for severe pain. (Patient not taking: Reported on 12/03/2016) 16 tablet 0  . ibuprofen (ADVIL,MOTRIN) 400 MG tablet Take 1.5 tablets (600 mg total) by mouth every 6 (six) hours as needed.  (Patient not taking: Reported on 12/03/2016) 30 tablet 0     Discharge Medications: Please see discharge summary for a list of discharge medications.  Relevant Imaging Results:  Relevant Lab Results:   Additional Information SS # [id number]  Jorge Ny, LCSW

## 2017-05-09 NOTE — ED Notes (Signed)
PT at bedside.

## 2017-05-09 NOTE — Progress Notes (Signed)
Will DC to [facility] under LOG- pt dtr informed of DC  [facility] to fax paperwork to CSW to complete with pt prior to transfer  After hours CSW to follow up with DC [phone] or [phone]  Jorge Ny, [place] Social Worker [phone]

## 2017-05-09 NOTE — ED Notes (Signed)
Pt eating breakfast 

## 2017-05-09 NOTE — ED Provider Notes (Signed)
PROGRESS NOTE                                                                                                                 This is a sign-out from [place]  at shift change: Theodore King is a 71 y.o. male presenting with guilty walking and lower extremity weakness, he has been noncompliant with his Parkinson's medication and feels much improved after taking his normal dosage.  Patient is pending PT OT evaluation in the a.m. Please refer to previous note for full HPI, ROS, PMH and PE.   PT recommends rehab for deconditioning.  Discussed with social worker who is aware and is trying to find appropriate placement for this gentleman.  Patient has been moved to [facility], holding area outside of the ED.     Waynetta Pean 05/09/17 1154    Jola Schmidt, MD 05/09/17 [phone]

## 2017-05-11 NOTE — Progress Notes (Signed)
Physical Therapy Note  (Late entry for G Code correction)    05/22/2017 1100  PT G-Codes **NOT FOR INPATIENT CLASS**  Functional Assessment Tool Used Clinical judgement  Functional Limitation Mobility: Walking and moving around  Mobility: Walking and Moving Around Current Status (M3846) CK  Mobility: Walking and Moving Around Goal Status (K5993) CI   Roney Marion, [place] Pager [phone] Office [phone]

## 2017-06-25 ENCOUNTER — Encounter (HOSPITAL_COMMUNITY): Payer: Self-pay

## 2017-06-25 ENCOUNTER — Emergency Department (HOSPITAL_COMMUNITY): Payer: Medicare HMO

## 2017-06-25 ENCOUNTER — Emergency Department (HOSPITAL_COMMUNITY)
Admission: EM | Admit: 2017-06-25 | Discharge: 2017-06-25 | Disposition: A | Discharge status: Home / Self Care | Payer: Medicare HMO | Attending: Emergency Medicine | Admitting: Emergency Medicine

## 2017-06-25 ENCOUNTER — Other Ambulatory Visit: Payer: Self-pay

## 2017-06-25 DIAGNOSIS — M5441 Lumbago with sciatica, right side: Secondary | ICD-10-CM | POA: Insufficient documentation

## 2017-06-25 DIAGNOSIS — M5442 Lumbago with sciatica, left side: Secondary | ICD-10-CM | POA: Diagnosis not present

## 2017-06-25 DIAGNOSIS — G8929 Other chronic pain: Secondary | ICD-10-CM

## 2017-06-25 DIAGNOSIS — Z79899 Other long term (current) drug therapy: Secondary | ICD-10-CM | POA: Diagnosis not present

## 2017-06-25 DIAGNOSIS — Z87891 Personal history of nicotine dependence: Secondary | ICD-10-CM | POA: Insufficient documentation

## 2017-06-25 DIAGNOSIS — G20 Parkinson's disease: Secondary | ICD-10-CM | POA: Diagnosis not present

## 2017-06-25 DIAGNOSIS — M6281 Muscle weakness (generalized): Secondary | ICD-10-CM | POA: Diagnosis present

## 2017-06-25 LAB — CBC WITH DIFFERENTIAL/PLATELET
Basophils Absolute: 0 10*3/uL (ref 0.0–0.1)
Basophils Relative: 0 %
Eosinophils Absolute: 0 10*3/uL (ref 0.0–0.7)
Eosinophils Relative: 1 %
HCT: 41.4 % (ref 39.0–52.0)
Hemoglobin: 13.9 g/dL (ref 13.0–17.0)
Lymphocytes Relative: 29 %
Lymphs Abs: 1.6 10*3/uL (ref 0.7–4.0)
MCH: 28 pg (ref 26.0–34.0)
MCHC: 33.6 g/dL (ref 30.0–36.0)
MCV: 83.3 fL (ref 78.0–100.0)
Monocytes Absolute: 0.3 10*3/uL (ref 0.1–1.0)
Monocytes Relative: 5 %
Neutro Abs: 3.6 10*3/uL (ref 1.7–7.7)
Neutrophils Relative %: 65 %
Platelets: 142 10*3/uL — ABNORMAL LOW (ref 150–400)
RBC: 4.97 MIL/uL (ref 4.22–5.81)
RDW: 13.5 % (ref 11.5–15.5)
WBC: 5.6 10*3/uL (ref 4.0–10.5)

## 2017-06-25 LAB — BASIC METABOLIC PANEL
Anion gap: 5 (ref 5–15)
BUN: 17 mg/dL (ref 6–20)
CO2: 29 mmol/L (ref 22–32)
Calcium: 9.4 mg/dL (ref 8.9–10.3)
Chloride: 106 mmol/L (ref 101–111)
Creatinine, Ser: 0.98 mg/dL (ref 0.61–1.24)
GFR calc Af Amer: >60 mL/min (ref >60)
GFR calc non Af Amer: >60 mL/min (ref >60)
Glucose, Bld: 88 mg/dL (ref 65–99)
Potassium: 4 mmol/L (ref 3.5–5.1)
Sodium: 140 mmol/L (ref 135–145)

## 2017-06-25 MED ORDER — OXYCODONE-ACETAMINOPHEN 5-325 MG PO TABS
2.0000 | ORAL_TABLET | Freq: Once | ORAL | Status: AC
  Administered 2017-06-25: 2 via ORAL
  Filled 2017-06-25: qty 2

## 2017-06-25 NOTE — ED Provider Notes (Signed)
[facility] DEPT Provider Note   CSN: [id number] Arrival date & time: 06/25/17  6333     History   Chief Complaint Chief Complaint  Patient presents with  . Leg Pain  . Leg Swelling    HPI Theodore King is a 72 y.o. male.  Patient is a 72 year old male who presents with generalized weakness.  He has a history of Parkinson's.  He states that he feels weak all over and is having trouble walking and sitting up.  He was here about a month ago for similar symptoms.  He had a PT and OT evaluation and was transferred to [facility] for rehabilitation.  He states he was doing better and went home about 2 weeks ago.  He states over the last few days he feels like he has been getting weak again.  He has not made any changes to his medications.  He states that he has been taking his medications.  He does have some chronic low back pain that radiates to both of his thighs, more on the right.  He states he is having some pain currently.  He denies any change in symptoms.  He denies any recent injuries.  On the triage note it was documented that he hurt it while bowling although he tells me that he has not been bowling for a long time.  He denies any numbness to his legs.  He denies any problems with bowel or bladder control.  He has a mild nonproductive cough.  No fevers.  No other recent illnesses.      Past Medical History:  Diagnosis Date  . Disorders of bursae and tendons in shoulder region, unspecified   . Other and unspecified hyperlipidemia   . Other and unspecified hyperlipidemia   . 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 Parkinson's disease ([place])   . Unspecified vitamin D deficiency   . Unspecified vitamin D deficiency     Patient Active Problem List   Diagnosis Date Noted  . Vertigo, benign paroxysmal 11/05/2016  . Abnormal dreams 09/06/2013  . 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 Hyperglycemia 05/18/2013  .  Paralysis agitans ([place]) 05/18/2013  . Vitamin D deficiency 05/18/2013  . Hyperlipidemia LDL goal < 100 05/18/2013  . Parkinson's disease ([place]) 10/26/2012    Past Surgical History:  Procedure Laterality Date  . APPENDECTOMY    . ROTATOR CUFF REPAIR     right       Home Medications    Prior to Admission medications   Medication Sig Start Date End Date Taking? Authorizing Provider  albuterol (PROVENTIL HFA;VENTOLIN HFA) 108 (90 BASE) MCG/ACT inhaler Inhale 2 puffs into the lungs every 6 (six) hours as needed for wheezing or shortness of breath. 07/04/13  Yes Lauree Chandler, NP  baclofen (LIORESAL) 20 MG tablet Take 20 mg by mouth 4 (four) times daily.   Yes [provider]  carbidopa-levodopa (SINEMET IR) 25-100 MG tablet Take 2 tablets by mouth 3 (three) times daily. 12/03/16  Yes Penumalli, Earlean Polka, MD  Cholecalciferol (VITAMIN D PO) Take 1,000 Units by mouth daily.    Yes [provider]  clonazePAM (KLONOPIN) 0.5 MG tablet Take 1 tablet (0.5 mg total) by mouth at bedtime. 12/03/16  Yes Penumalli, Earlean Polka, MD  meclizine (ANTIVERT) 25 MG tablet Take 25 mg by mouth daily as needed for dizziness.  05/11/16  Yes [provider]  Multiple Vitamins-Minerals (MULTIVITAMIN WITH MINERALS) tablet Take 1 tablet  by mouth daily.   Yes [provider]  pramipexole (MIRAPEX) 1 MG tablet Take 2 tablets (2 mg total) by mouth 2 (two) times daily. 12/03/16  Yes Penumalli, Earlean Polka, MD  rasagiline (AZILECT) 0.5 MG TABS tablet Take 1 tablet (0.5 mg total) by mouth daily. 02/24/17  Yes Penumalli, Earlean Polka, MD    Family History Family History  Problem Relation Age of Onset  . Cancer Mother   . Cancer Father   . Stroke Brother   . Cancer Sister   . Cancer Sister   . Colon cancer Neg Hx   . Esophageal cancer Neg Hx   . Rectal cancer Neg Hx   . Stomach cancer Neg Hx     Social History Social History   Tobacco Use  . Smoking status: Former Research scientist (life sciences)  .  Smokeless tobacco: Never Used  Substance Use Topics  . Alcohol use: Yes    Comment: occasionally ("a sip every now and then")  . Drug use: No     Allergies   Patient has no known allergies.   Review of Systems Review of Systems  Constitutional: Negative for chills, diaphoresis, fatigue and fever.  HENT: Negative for congestion, rhinorrhea and sneezing.   Eyes: Negative.   Respiratory: Positive for cough. Negative for chest tightness and shortness of breath.   Cardiovascular: Negative for chest pain and leg swelling.  Gastrointestinal: Negative for abdominal pain, blood in stool, diarrhea, nausea and vomiting.  Genitourinary: Negative for difficulty urinating, flank pain, frequency and hematuria.  Musculoskeletal: Positive for back pain and myalgias. Negative for arthralgias.  Skin: Negative for rash.  Neurological: Positive for weakness (Generalized). Negative for dizziness, speech difficulty, numbness and headaches.     Physical Exam Updated Vital Signs BP (!) 141/78 (BP Location: Left Arm)   Pulse (!) 58   Temp (!) 97.4 F (36.3 C) (Oral)   Resp 18   SpO2 100%   Physical Exam  Constitutional: He is oriented to person, place, and time. He appears well-developed and well-nourished.  HENT:  Head: Normocephalic and atraumatic.  Eyes: Pupils are equal, round, and reactive to light.  Neck: Normal range of motion. Neck supple.  Cardiovascular: Normal rate, regular rhythm and normal heart sounds.  Pulmonary/Chest: Effort normal and breath sounds normal. No respiratory distress. He has no wheezes. He has no rales. He exhibits no tenderness.  Abdominal: Soft. Bowel sounds are normal. There is no tenderness. There is no rebound and no guarding.  Musculoskeletal: Normal range of motion. He exhibits no edema.  Positive mild tenderness to the lower lumbar spine.  No step-offs or deformities.  Negative straight leg raise bilaterally.  Patellar reflex is symmetric bilaterally.    Lymphadenopathy:    He has no cervical adenopathy.  Neurological: He is alert and oriented to person, place, and time.  Patient has 5 out of 5 strength in all extremities.  He is able to raise both legs off the bed.  He has normal flexion and extension.  Normal dorsiflexion and plantarflexion of the feet.  He has normal sensation to light touch in both lower extremities.  Pedal pulses are intact.  No swelling of the legs.  Skin: Skin is warm and dry. No rash noted.  Psychiatric: He has a normal mood and affect.     ED Treatments / Results  Labs (all labs ordered are listed, but only abnormal results are displayed) Labs Reviewed  CBC WITH DIFFERENTIAL/PLATELET - Abnormal; Notable for the following components:  Result Value   Platelets 142 (*)    All other components within normal limits  BASIC METABOLIC PANEL    EKG  EKG Interpretation None       Radiology Dg Chest 2 View  Result Date: 06/25/2017 CLINICAL DATA:  Cough for 3 weeks. EXAM: CHEST  2 VIEW COMPARISON:  05/08/2017 FINDINGS: Enlarged cardiac silhouette. Calcific atherosclerotic disease and tortuosity of the aorta. Marked elevation of the left hemidiaphragm There is no evidence of focal airspace consolidation, pleural effusion or pneumothorax. Left lung base atelectasis. Osseous structures are without acute abnormality. Soft tissues are grossly normal. IMPRESSION: No evidence of lobar consolidation. Marked elevation of the left hemidiaphragm with left lower lobe atelectasis. Enlarged cardiac silhouette and calcific atherosclerotic disease of the aorta. Electronically Signed   By: Fidela Salisbury M.D.   On: 06/25/2017 18:19    Procedures Procedures (including critical care time)  Medications Ordered in ED Medications  oxyCODONE-acetaminophen (PERCOCET/ROXICET) 5-325 MG per tablet 2 tablet (2 tablets Oral Given 06/25/17 1807)     Initial Impression / Assessment and Plan / ED Course  I have reviewed the triage  vital signs and the nursing notes.  Pertinent labs & imaging results that were available during my care of the patient were reviewed by me and considered in my medical decision making (see chart for details).     Patient is a 72 year old male who has a history of Parkinson's who presents with weakness.  He has generalized weakness.  There is no unilateral weakness or other suggestions of an acute stroke.  He has a slight tremor which seems to be baseline for him.  He does have good strength in all extremities.  He is able to ambulate in the room without assistance.  He does seem to have some general decline related to his Parkinson's and he also has some chronic low back pain that radiates to both of his legs, more commonly on the right.  He has been seen for this multiple times in the past.  He had an MRI several months ago for the symptoms.  There is no suggestions of cauda equina.  It does not seem to be any significant change in his baseline.  He is continuing to get home physical therapy.  His pain improved after 1 dose of pain medication in the emergency room.  I feel that he is safe for discharge.  He seems to be at his baseline.  He was encouraged to follow-up with his primary care physician regarding ongoing care for his Parkinson's.  I did give him a prescription for a walker as he does not have a walker at home to use.  I also gave him a referral to follow-up with an orthopedist regarding his back pain.  He has hydrocodone at home to use for the pain so I did not give him a different prescription.  Return precautions were given.  Final Clinical Impressions(s) / ED Diagnoses   Final diagnoses:  Parkinson disease ([place])  Chronic midline low back pain with bilateral sciatica    ED Discharge Orders        Ordered    Walker standard     06/25/17 2236       Malvin Johns, MD 06/25/17 2241

## 2017-06-25 NOTE — ED Triage Notes (Addendum)
Pt c/o anterior R upper leg pain and swelling starting last night while bowling.  Pain score 7/10.  Pt reports taking prescription pain medication w/o relief.  Currently, no redness, warmth or swelling noted.    Pt walked into and out of triage w/o difficulty.

## 2017-07-04 ENCOUNTER — Encounter: Payer: Self-pay | Admitting: Neurology

## 2017-07-08 NOTE — Progress Notes (Deleted)
Theodore King was seen today in the movement disorders clinic for neurologic consultation at the request of [name], MD.  The consultation is for the evaluation of PD.  Pt currently under the care of Dr. [name].  The records that were made available to me were reviewed.  His first symptom was left greater than right upper extremity tremor in 2012.  It started after a tear in the rotator cuff, which he thought was the initial cause.  Around that time, he also developed shuffling gait.  It appears that he started levodopa around that time and was not sure that it was beneficial.  In 2013, he started on pramipexole and did think it was beneficial for tremor.  He did notice, however, that with the course of time when he increased levodopa that it did seem to be helpful.  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  He last saw Dr. [name] in June, 2018.  He has been seen at the emergency room several times since then.  He went to the emergency room on December 2 with complaints of leg spasm and increasing tremors.  He admitted he had not taken his Parkinson's medication the day he was seen in the emergency room (seen around noon).  He had also been given baclofen by his primary care physician, but has not picked that up from the pharmacy.  The biggest issue, according to the emergency room, was noncompliance with medication (also noted in Dr. Gladstone Lighter notes).  Once given medication in the emergency room he felt better.  He was discharged to [facility].  He came back to the emergency room on June 25, 2017 for similar complaints of generalized weakness.  No weakness was found on examination and he was discharged home.  He was given a walker.   Specific Symptoms:  Tremor: {yes no:314532} Family hx of similar:  {yes no:314532} Voice: *** Sleep: ***  Vivid Dreams:  {yes no:314532}  Acting out dreams:  {yes no:314532} Wet Pillows: {yes no:314532} Postural  symptoms:  {yes no:314532}  Falls?  {yes no:314532} Bradykinesia symptoms: {parkinson brady:18041} Loss of smell:  {yes no:314532} Loss of taste:  {yes no:314532} Urinary Incontinence:  {yes no:314532} Difficulty Swallowing:  {yes no:314532} Handwriting, micrographia: {yes no:314532} Trouble with ADL's:  {yes no:314532}  Trouble buttoning clothing: {yes no:314532} Depression:  {yes no:314532} Memory changes:  {yes no:314532} Hallucinations:  {yes no:314532}  visual distortions: {yes no:314532} N/V:  {yes no:314532} Lightheaded:  {yes no:314532}  Syncope: {yes no:314532} Diplopia:  {yes no:314532} Dyskinesia:  {yes no:314532}  Neuroimaging of the brain has *** previously been performed.  It *** available for my review today.  PREVIOUS MEDICATIONS: {Parkinson's RX:18200}  ALLERGIES:  No Known Allergies  CURRENT MEDICATIONS:  Outpatient Encounter Medications as of 07/12/2017  Medication Sig  . albuterol (PROVENTIL HFA;VENTOLIN HFA) 108 (90 BASE) MCG/ACT inhaler Inhale 2 puffs into the lungs every 6 (six) hours as needed for wheezing or shortness of breath.  . baclofen (LIORESAL) 20 MG tablet Take 20 mg by mouth 4 (four) times daily.  . carbidopa-levodopa (SINEMET IR) 25-100 MG tablet Take 2 tablets by mouth 3 (three) times daily.  . Cholecalciferol (VITAMIN D PO) Take 1,000 Units by mouth daily.   . clonazePAM (KLONOPIN) 0.5 MG tablet Take 1 tablet (0.5 mg total) by mouth at bedtime.  . meclizine (ANTIVERT) 25 MG tablet Take 25 mg by mouth daily as needed for dizziness.   . Multiple Vitamins-Minerals (MULTIVITAMIN WITH MINERALS) tablet  Take 1 tablet by mouth daily.  . pramipexole (MIRAPEX) 1 MG tablet Take 2 tablets (2 mg total) by mouth 2 (two) times daily.  . rasagiline (AZILECT) 0.5 MG TABS tablet Take 1 tablet (0.5 mg total) by mouth daily.   No facility-administered encounter medications on file as of 07/12/2017.     PAST MEDICAL HISTORY:   Past Medical History:  Diagnosis  Date  . Disorders of bursae and tendons in shoulder region, unspecified   . Other and unspecified hyperlipidemia   . Other and unspecified hyperlipidemia   . 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 Parkinson's disease ([place])   . Unspecified vitamin D deficiency   . Unspecified vitamin D deficiency     PAST SURGICAL HISTORY:   Past Surgical History:  Procedure Laterality Date  . APPENDECTOMY    . ROTATOR CUFF REPAIR     right    SOCIAL HISTORY:   Social History   Socioeconomic History  . Marital status: Legally Separated    Spouse name: Not on file  . Number of children: 3  . Years of education: 12th  . Highest education level: Not on file  Social Needs  . Financial resource strain: Not on file  . Food insecurity - worry: Not on file  . Food insecurity - inability: Not on file  . Transportation needs - medical: Not on file  . Transportation needs - non-medical: Not on file  Occupational History    Employer: RETIRED  Tobacco Use  . Smoking status: Former Research scientist (life sciences)  . Smokeless tobacco: Never Used  Substance and Sexual Activity  . Alcohol use: Yes    Comment: occasionally ("a sip every now and then")  . Drug use: No  . Sexual activity: No  Other Topics Concern  . Not on file  Social History Narrative  . Not on file    FAMILY HISTORY:   Family Status  Relation Name Status  . Mother  Deceased  . Father  Deceased  . Brother Collins [place] Deceased  . Sister Kyung Bacca Deceased  . Daughter [employer]  . Son Tessa Lerner  . Sister Hulda Marin Deceased  . Brother [employer]  . Brother [employer]  . Brother [employer]  . Brother [employer]  . Brother [employer]  . Brother [employer]  . Daughter [name] Alive  . Neg Hx  (Not Specified)    ROS:  A complete 10 system review of systems was obtained and was unremarkable apart from what is mentioned above.  PHYSICAL EXAMINATION:    VITALS:  There were no vitals filed for this visit.  GEN:  The  patient appears stated age and is in NAD. HEENT:  Normocephalic, atraumatic.  The mucous membranes are moist. The superficial temporal arteries are without ropiness or tenderness. CV:  RRR Lungs:  CTAB Neck/HEME:  There are no carotid bruits bilaterally.  Neurological examination:  Orientation: The patient is alert and oriented x3. Fund of knowledge is appropriate.  Recent and remote memory are intact.  Attention and concentration are normal.    Able to name objects and repeat phrases. Cranial nerves: There is good facial symmetry. Pupils are equal round and reactive to light bilaterally. Fundoscopic exam reveals clear margins bilaterally. Extraocular muscles are intact. The visual fields are full to confrontational testing. The speech is fluent and clear. Soft palate rises symmetrically and there is no tongue deviation. Hearing is intact to conversational tone. Sensation: Sensation is intact to light and pinprick  throughout (facial, trunk, extremities). Vibration is intact at the bilateral big toe. There is no extinction with double simultaneous stimulation. There is no sensory dermatomal level identified. Motor: Strength is 5/5 in the bilateral upper and lower extremities.   Shoulder shrug is equal and symmetric.  There is no pronator drift. Deep tendon reflexes: Deep tendon reflexes are 2/4 at the bilateral biceps, triceps, brachioradialis, patella and achilles. Plantar responses are downgoing bilaterally.  Movement examination: Tone: There is ***tone in the bilateral upper extremities.  The tone in the lower extremities is ***.  Abnormal movements: *** Coordination:  There is *** decremation with RAM's, *** Gait and Station: The patient has *** difficulty arising out of a deep-seated chair without the use of the hands. The patient's stride length is ***.  The patient has a *** pull test.      ASSESSMENT/PLAN:  ***Idiopathic Parkinson's disease.    -We discussed the diagnosis as well as  pathophysiology of the disease.  We discussed treatment options as well as prognostic indicators.  Patient education was provided.  -We discussed that it used to be thought that levodopa would increase risk of melanoma but now it is believed that Parkinsons itself likely increases risk of melanoma. he is to get regular skin checks.  -Greater than 50% of the 80 minute visit was spent in counseling answering questions and talking about what to expect now as well as in the future.  We talked about medication options as well as potential future surgical options.  We talked about safety in the home.  -I will refer the patient to the Parkinson's program [facility], for PT/OT and ST.  We talked about the importance of safe, cardiovascular exercise in Parkinson's disease.  -We discussed community resources in the area including patient support groups and community exercise programs for PD and pt education was provided to the patient.  -Discussed the importance of compliance with medication as well as with visits.  I think that support is going to be a huge issue here, and I am not sure that he has the support necessary.  He met with our social worker today and we will see what we can do from our end to provide him a support network.  ***This did not include the 40 min of record review which was detailed above, which was non face to face time.   Cc:  [name], MD

## 2017-07-12 ENCOUNTER — Ambulatory Visit: Payer: Self-pay | Admitting: Neurology

## 2017-07-12 ENCOUNTER — Telehealth: Payer: Self-pay | Admitting: Neurology

## 2017-07-12 NOTE — Telephone Encounter (Signed)
Received call from patient at 8:37 am that too sick to come to 8:45 am appointment and needed to r/s.  Family called back not long thereafter (called at his appt time) and said they would be here but would be at least 15 min more.  Told to go ahead and r/s appointment.

## 2017-07-15 NOTE — Progress Notes (Signed)
Theodore King was seen today in the movement disorders clinic for neurologic consultation at the request of [name], MD.  The consultation is for the evaluation of PD.  Pt currently under the care of Dr. [name].  The records that were made available to me were reviewed.  Pts girlfriend in the waiting room but he refused to allow her in (I asked few times).  His first symptom was left greater than right upper extremity tremor in 2012.  It started after a tear in the rotator cuff, which he thought was the initial cause.  Around that time, he also developed shuffling gait.  It appears that he started levodopa around that time and was not sure that it was beneficial.  In 2013, he started on pramipexole and did think it was beneficial for tremor.  He did notice, however, that with the course of time when he increased levodopa that it did seem to be helpful.  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  He last saw Dr. [name] in June, 2018.  He has been seen at the emergency room several times since then.  He went to the emergency room on December 2 with complaints of leg spasm and increasing tremors.  He admitted he had not taken his Parkinson's medication the day he was seen in the emergency room (seen around noon).  He had also been given baclofen by his primary care physician, but has not picked that up from the pharmacy.  The biggest issue, according to the emergency room, was noncompliance with medication (also noted in Dr. Gladstone Lighter notes).  Once given medication in the emergency room he felt better.  He was discharged to [facility].  He came back to the emergency room on June 25, 2017 for similar complaints of generalized weakness.  No weakness was found on examination and he was discharged home.  He was given a walker.  Pt reports that he is on carbidopa/levodopa 25/100, 1 three times per day.  He is supposed to be on 2 po tid.  He doesn't know other  meds as daughter takes care of them.  Pramipexole and azilect are listed.    Specific Symptoms:  Tremor: Yes.   Family hx of similar:  No. Voice: "I have a tremor in my voice." Sleep: trouble getting to sleep  Vivid Dreams:  Yes.   - "Its like I see things"  Acting out dreams:  No. Wet Pillows: Yes.   Postural symptoms:  Yes.    Falls?  Yes.  , states that he has had 2 falls.  Last fall was about 2 weeks ago.  He was bending over to put on shoes and he kept going.  Didn't get hurt Bradykinesia symptoms: shuffling gait, slow movements and difficulty getting out of a chair Loss of smell:  No. Loss of taste:  No. Urinary Incontinence:  yes but has urinary urgency and occasionally will wear depends if goes out of town Difficulty Swallowing:  No. Handwriting, micrographia: Yes.   Trouble with ADL's:  No.  Trouble buttoning clothing: No. Depression:  No., but "I get angry at myself" Memory changes:  No. - patient denies memory change.  Daughter (23y/o) lives with the patient.  Has a girlfriend.  Puts med in pillbox and girlfriend prepares it.  Daughter does grocery shopping and pays bills.  Pt does not "but I haven't gotten my license back yet."  He cannot tell me why he lost it but may  be from memory. Hallucinations:  Yes (initially denies and then says "I see people but I know there are not people."  visual distortions: yes - "I think something is crawling on the floor and then I realize nothing is there" N/V:  No. Lightheaded:  No.  Syncope: No. Diplopia:  Yes.   Dyskinesia:  No.  Neuroimaging of the brain has previously been performed.  It is available for my review today.  Had MRI brain in 2013 and it showed rare T2 hyperintensity.    PREVIOUS MEDICATIONS: Sinemet  ALLERGIES:  No Known Allergies  CURRENT MEDICATIONS:  Outpatient Encounter Medications as of 07/19/2017  Medication Sig  . albuterol (PROVENTIL HFA;VENTOLIN HFA) 108 (90 BASE) MCG/ACT inhaler Inhale 2 puffs into the  lungs every 6 (six) hours as needed for wheezing or shortness of breath.  . baclofen (LIORESAL) 20 MG tablet Take 20 mg by mouth 4 (four) times daily.  . carbidopa-levodopa (SINEMET IR) 25-100 MG tablet Take 2 tablets by mouth 3 (three) times daily. (Patient taking differently: Take 1 tablet by mouth 3 (three) times daily. )  . Cholecalciferol (VITAMIN D PO) Take 1,000 Units by mouth daily.   . clonazePAM (KLONOPIN) 0.5 MG tablet Take 1 tablet (0.5 mg total) by mouth at bedtime.  Marland Kitchen HYDROcodone-acetaminophen (NORCO/VICODIN) 5-325 MG tablet Take 1 tablet by mouth every 6 (six) hours as needed for moderate pain.  . meclizine (ANTIVERT) 25 MG tablet Take 25 mg by mouth daily as needed for dizziness.   . Multiple Vitamins-Minerals (MULTIVITAMIN WITH MINERALS) tablet Take 1 tablet by mouth daily.  . pramipexole (MIRAPEX) 1 MG tablet Take 2 tablets (2 mg total) by mouth 2 (two) times daily.  . rasagiline (AZILECT) 0.5 MG TABS tablet Take 1 tablet (0.5 mg total) by mouth daily.  Marland Kitchen Pimavanserin Tartrate (NUPLAZID) 34 MG CAPS Take 1 capsule by mouth daily.   No facility-administered encounter medications on file as of 07/19/2017.     PAST MEDICAL HISTORY:   Past Medical History:  Diagnosis Date  . Disorders of bursae and tendons in shoulder region, unspecified   . Other and unspecified hyperlipidemia   . Other and unspecified hyperlipidemia   . 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 Parkinson's disease ([place])   . Unspecified vitamin D deficiency   . Unspecified vitamin D deficiency     PAST SURGICAL HISTORY:   Past Surgical History:  Procedure Laterality Date  . APPENDECTOMY    . ROTATOR CUFF REPAIR     right    SOCIAL HISTORY:   Social History   Socioeconomic History  . Marital status: Legally Separated    Spouse name: Not on file  . Number of children: 3  . Years of education: 12th  . Highest education level: Not on file  Social Needs  . Financial resource strain: Not  on file  . Food insecurity - worry: Not on file  . Food insecurity - inability: Not on file  . Transportation needs - medical: Not on file  . Transportation needs - non-medical: Not on file  Occupational History    Employer: RETIRED  Tobacco Use  . Smoking status: Former Research scientist (life sciences)  . Smokeless tobacco: Never Used  Substance and Sexual Activity  . Alcohol use: Yes    Comment: occasionally ("a sip every now and then")  . Drug use: No  . Sexual activity: No  Other Topics Concern  . Not on file  Social History Narrative  . Not on file  FAMILY HISTORY:   Family Status  Relation Name Status  . Mother  Deceased  . Father  Deceased  . Brother Collins Scotland Deceased  . Sister Kyung Bacca Deceased  . Daughter [employer]  . Son Tessa Lerner  . Sister Hulda Marin Deceased  . Brother [employer]  . Brother [employer]  . Brother [employer]  . Brother [employer]  . Brother [employer]  . Brother [employer]  . Daughter [name] Alive  . Neg Hx  (Not Specified)    ROS:  A complete 10 system review of systems was obtained and was unremarkable apart from what is mentioned above.  PHYSICAL EXAMINATION:    VITALS:   Vitals:   07/19/17 0825  BP: 124/66  Pulse: 60  SpO2: 97%  Weight: 235 lb (106.6 kg)  Height: 5' 11.5" (1.816 m)    GEN:  The patient appears stated age and is in NAD. HEENT:  Normocephalic, atraumatic.  The mucous membranes are moist. The superficial temporal arteries are without ropiness or tenderness. CV:  RRR Lungs:  CTAB Neck/HEME:  There are no carotid bruits bilaterally.  Neurological examination:  Orientation:  Montreal Cognitive Assessment  07/19/2017  Visuospatial/ Executive (0/5) 1  Naming (0/3) 2  Attention: Read list of digits (0/2) 1  Attention: Read list of letters (0/1) 0  Attention: Serial 7 subtraction starting at 100 (0/3) 1  Language: Repeat phrase (0/2) 2  Language : Fluency (0/1) 1  Abstraction (0/2) 0  Delayed Recall (0/5) 0    Orientation (0/6) 4  Total 12  Adjusted Score (based on education) 13   Cranial nerves: There is good facial symmetry. Pupils are equal round and reactive to light bilaterally. Fundoscopic exam is attempted but the disc margins are not well visualized bilaterally. Extraocular muscles are intact. The visual fields are full to confrontational testing. The speech is fluent and clear. Soft palate rises symmetrically and there is no tongue deviation. Hearing is intact to conversational tone. Sensation: Sensation is intact to light and pinprick throughout (facial, trunk, extremities). Vibration is absent at the right big toe and decreased at the L big toe.  Vibration is decreased distally. There is no extinction with double simultaneous stimulation. There is no sensory dermatomal level identified. Motor: Strength is 5/5 in the bilateral upper and lower extremities.   Shoulder shrug is equal and symmetric.  There is no pronator drift.  Is some apraxia when asked to perform motor commands Deep tendon reflexes: Deep tendon reflexes are 0/4 at the bilateral biceps, triceps, brachioradialis, patella and achilles. Plantar responses are downgoing bilaterally.  Movement examination: Tone: There is normal tone in the bilateral upper extremities.  The tone in the lower extremities is normal.  Abnormal movements: no tremor noted today.   Coordination:  There is no decremation with RAM's, with any form of RAMS, including alternating supination and pronation of the forearm, hand opening and closing, finger taps, heel taps and toe taps. Gait and Station: The patient has mild difficulty arising out of a deep-seated chair without the use of the hands. The patient's stride length is good. No shuffling.  He is just mildly unstable without the cane.       ASSESSMENT/PLAN:  1.  Idiopathic Parkinson's disease, by history  -He does not appear particularly parkinsonian today, but is on medication which may be covering up  symptoms.  He does report he has tremor if he does not take medication.  The only medication that he felt  confident he was on was carbidopa/levodopa 25/100 and is only taking 1 tablet 3 times per day.  He reports taking the last one at bedtime.  I told him to instead take 1 tablet at 7 AM/11 AM/4 PM.  -Patient is unsure of other medications.  Pramipexole is listed at a total of 4 mg daily (2 mg twice per day).  This is somewhat of an odd dosing regimen and I am not sure if he is on this.  If so, the pramipexole needs to be tapered because of memory change.  -He reports he is not sure if he is still on the Azilect.  We will need to check on this as well.  -We discussed the diagnosis as well as pathophysiology of the disease.  We discussed treatment options as well as prognostic indicators.  Patient education was provided.  -We discussed that it used to be thought that levodopa would increase risk of melanoma but now it is believed that Parkinsons itself likely increases risk of melanoma. he is to get regular skin checks.  -Greater than 50% of the 60 minute visit was spent in counseling answering questions and talking about what to expect now as well as in the future.  We talked about medication options as well as potential future surgical options.  We talked about safety in the home.  I did have him sign a release regarding his daughter so we can see if she will come in to see social work with the patient.  -We discussed community resources in the area including patient support groups and community exercise programs for PD and pt education was provided to the patient.  -Discussed the importance of compliance with medication as well as with visits.  I think that support is going to be a huge issue here, and I am not sure that he has the support necessary.  He was given patient literature to read and we will follow-up with him with our Education officer, museum.  2.  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  This is a common part of Parkinson's disease and we discussed this today.  We talked about the nature, etiology and pathophysiology as well as the degenerative nature.  We discussed medications for this and what the expectations with medication would be.  The patient ultimately decided to add Nuplazid for the hallucinations.  We discussed the black box warning.  We discussed risks, benefits, and side effects.    We offered our PD social worker as part of a support system.  3.  Follow up is anticipated in the next few months, sooner should new neurologic issues arise. Visit time did not include the 40 min of record review which was detailed above, which was non face to face time.   Cc:  [name], MD

## 2017-07-19 ENCOUNTER — Encounter: Payer: Self-pay | Admitting: Neurology

## 2017-07-19 ENCOUNTER — Ambulatory Visit (INDEPENDENT_AMBULATORY_CARE_PROVIDER_SITE_OTHER): Payer: Medicare HMO | Admitting: Neurology

## 2017-07-19 VITALS — BP 124/66 | HR 60 | Ht 71.5 in | Wt 235.0 lb

## 2017-07-19 DIAGNOSIS — G20 Parkinson's disease: Secondary | ICD-10-CM | POA: Diagnosis not present

## 2017-07-19 DIAGNOSIS — R441 Visual hallucinations: Secondary | ICD-10-CM

## 2017-07-19 DIAGNOSIS — F0280 Dementia in other diseases classified elsewhere without behavioral disturbance: Secondary | ICD-10-CM | POA: Diagnosis not present

## 2017-07-19 MED ORDER — PIMAVANSERIN TARTRATE 34 MG PO CAPS: 1.0000 | ORAL_CAPSULE | Freq: Every day | ORAL | 0 refills | Status: DC

## 2017-07-19 NOTE — Patient Instructions (Signed)
1. Change Carbidopa Levodopa 25/100 to one tablet at 7 am, one tablet at 11 am, and one tablet at 4 pm.   2. We will send information to Nupl[facility]. They will contact you directly about starting medication. They will call from a "1-800" number. Make sure you answer this call. Samples given.   3. See Janett Billow next week.   4. Follow up 3 months.

## 2017-07-22 ENCOUNTER — Telehealth: Payer: Self-pay | Admitting: Psychology

## 2017-07-22 NOTE — Telephone Encounter (Signed)
Telephone call with patient's daughter to schedule a meeting with both she and the patient to come up with strategies to help patient live well with his disease including medication compliance.  She shared that she is in school full-time but has availability on Monday Wednesday Friday between 1 and 3 PM.  She lives with her father full-time.  However, he has a girlfriend that he goes and stays with for several days at a time.  From a medication compliance perspective it will be important to account that he lives in multiple places when we are discussing strategies.    We scheduled time to meet on Friday, February 22 at 1:30pm.

## 2017-07-29 ENCOUNTER — Ambulatory Visit (INDEPENDENT_AMBULATORY_CARE_PROVIDER_SITE_OTHER): Payer: Medicare HMO | Admitting: Psychology

## 2017-07-29 ENCOUNTER — Other Ambulatory Visit: Payer: Self-pay

## 2017-07-29 DIAGNOSIS — G20 Parkinson's disease: Secondary | ICD-10-CM

## 2017-07-29 NOTE — Progress Notes (Signed)
I met with the patient and his daughter today with the purpose of talking about Parkinson's disease in general, medication compliance and resources and services in the area that he can participate in.  It was important for his daughter to be included as the patient has some memory issues and his daughter is involved in his care.  The patient's girlfriend, Romie Minus, was not present.  The patient reports that he is not sleeping well.  I will make sure that the doctor gets this information.  However, we discussed sleep hygiene and things that he could do that would help create better sleep habits.  We identified that the patient will wake up and eat snacks, that he goes to sleep at various hours of the evening ranging from 7 PM and as late as midnight and he sometimes will take long naps during the day.  We talked about some strategies to help him in terms of developing a sleep routine, reducing or eliminating naps during the day, exercise (which is recommended for his Parkinson's disease) and not eating when he wakes up.  We also talked about resources that he would benefit from.  The patient and his daughter both expressed interest in rock steady boxing.  I will start the referral for this program in addition to getting in touch with the program that manages scholarships for this exercise.  In addition, I will start to complete a SCAT application for the patient.  He relies on transportation from his daughter and/or girlfriend.  I provided information about the senior wheels program as they may help with transportation.   Patient and patient's daughter were interested in learning more about the healthcare power of attorney process.  I printed off information and gave them that with also resources for legal assistance for seniors.  We discussed that it would be good that going forward that the patient has a care partner with him at his medical appointments.  This was mostly discussed due to the concern that the  patient may not retain all the information and the visit by himself.  Over 60 minutes was spent with the patient and his daughter on the above items.

## 2017-08-01 ENCOUNTER — Telehealth: Payer: Self-pay | Admitting: Neurology

## 2017-08-01 MED ORDER — PIMAVANSERIN TARTRATE 34 MG PO CAPS: 1.0000 | ORAL_CAPSULE | Freq: Every day | ORAL | 0 refills | Status: DC

## 2017-08-01 NOTE — Telephone Encounter (Signed)
Patient daughter called and states that patient is out of the Nuplazid and would like [place] to call it in to the [facility] [place]

## 2017-08-01 NOTE — Telephone Encounter (Signed)
Spoke with daughter. Made aware Nuplazid has to be shipped from a mail order pharmacy. Given the number to [facility] to contact them (she states they have not called). Samples given.

## 2017-08-02 ENCOUNTER — Telehealth: Payer: Self-pay | Admitting: Diagnostic Neuroimaging

## 2017-08-02 NOTE — Telephone Encounter (Signed)
Theodore King called again checking on status. She is aware RN was in a meeting and she has not spoken with pharmacy. She was appreciative

## 2017-08-02 NOTE — Telephone Encounter (Signed)
Discussed with Dr [name] re: patient has seen another neurologist and has FU with her.  Dr [name] advised that the patient may continue care here or with Dr Tat, and see one neurologist  to be consistent with his care. Dr [name] will be glad to continue to see patient, or he may see Dr Tat to get stabilized and come back to be followed here if he wishes. Called patient to discuss. Advised him of above and then he put his friend, Romie Minus on phone. Advised her of the same. She was concerned about his rasagiline. Informed her that this RN will have to reach [facility] to see why they are not refilling rasagaline. Romie Minus stated he has been out of medication for a week. This RN advised will call pharmacy again tomorrow. Informed her that patient has FU here in March and with Dr Tat in May. Advised her again that he would be best served to consolidate his care under one neurologist.   She stated okay and kept repeating "okay".  She then ended the call. Will try to reach [facility] tomorrow.

## 2017-08-02 NOTE — Telephone Encounter (Signed)
Romie Minus called checking on status of refill. She is aware of the msg below and RN will try back again today

## 2017-08-02 NOTE — Telephone Encounter (Signed)
Pts friend Romie Minus calling stating the pharmacy cant refill rasagiline (AZILECT) 0.5 MG TABS tablet till the 9th of March but pt has been out of the medication for about a week. Romie Minus isnt sure why he isnt able to get them now since he has been taking 1 a day since receiving medication

## 2017-08-02 NOTE — Telephone Encounter (Addendum)
Called [facility], unable to continue holding. Will call back later.   Attempted to reach [facility] again, on hold 10 minutes, will have to call back.

## 2017-08-03 ENCOUNTER — Telehealth: Payer: Self-pay | Admitting: Neurology

## 2017-08-03 NOTE — Telephone Encounter (Addendum)
Called [facility], spoke with Earline to discuss medication issue patient's friend reported. Earline stated the medication was last refilled on 05/30/17 for 90 days. Therefore the patient should have enough medication until August 28, 2017. Insurance will not allow a refill until Aug 13, 2017. She also stated that when patient refills it will generate a PA.   This RN thanked her for her assistance. Called and spoke with patient and advised him of above. He then put his daughter, [name] on phone and this RN advised her of conversation with pharmacist. Also advised her of conversation with patient and Romie Minus yesterday re: seeing two neurologists. [name] stated she is trying to get POA for her dad. She stated she puts mediations in pill boxes, but Romie Minus keeps all bottles at her home. [name] was unaware that he was out of rasagiline. She will discuss medication shortage with her father and Romie Minus, and will also discuss which neurologist he would like to have manage his care. She is aware he has FU with Dr [name] 08/23/17 and FU with Dr Carles Collet, in May. She will call this office back when a decision is made about his FU next month. She understands that he cannot get medication refilled until 08/13/17 and at that time a PA will be required. This RN advised that patient and family talk about the best way to manage his medications and care.  She verbalized understanding of call, appreciation.            kim

## 2017-08-03 NOTE — Telephone Encounter (Signed)
Received approval from [facility] for Nuplazid valid through 06/06/18.

## 2017-08-10 ENCOUNTER — Telehealth: Payer: Self-pay | Admitting: Neurology

## 2017-08-10 NOTE — Telephone Encounter (Signed)
Patient called and needed to let you know that he will be running out of his Nuplazid medication and needs a refill. Please Call. Thanks

## 2017-08-10 NOTE — Telephone Encounter (Signed)
Patient should be getting through [facility] pharmacy. I spoke with daughter about this a couple weeks ago. He should have plenty of refills. Called back to find out how I could help- but patient is the only one home and he wasn't aware someone called me. Will await call back.

## 2017-08-11 ENCOUNTER — Telehealth: Payer: Self-pay | Admitting: Neurology

## 2017-08-11 MED ORDER — PIMAVANSERIN TARTRATE 34 MG PO CAPS: 34.0000 mg | ORAL_CAPSULE | Freq: Every day | ORAL | 0 refills | Status: DC

## 2017-08-11 NOTE — Telephone Encounter (Signed)
Patient still has not received Nuplazid. Made him aware that I spoke with daughter about contacting specialty pharmacy/[facility]. They will follow up with that, but aware I can not keep giving samples. Gave two more weeks.

## 2017-08-17 ENCOUNTER — Encounter: Payer: Self-pay | Admitting: Psychology

## 2017-08-17 NOTE — Progress Notes (Signed)
Theodore King has finished his [employer] assessment(accompanied by his daughter, [name]).  [name] has already applied for SCAT transportation(shared ride service operated by [employer] authority)-once this is approved, she will be able to schedule his rides to class. He will need a corner person during class- and the RSB coach thinks that they can find someone, but first he has to be cleared for exercise(he just had cataract surgery) and transportation established. Lots of hurdles, but he really wants to participate. Daughter hopes to bring him and be his corner this summer. He is ready to go once he has cleared the logistical hurdles. He shoul have no problems with obtaining a scholarship for this program.

## 2017-08-23 ENCOUNTER — Ambulatory Visit: Payer: Medicare HMO | Admitting: Diagnostic Neuroimaging

## 2017-08-31 ENCOUNTER — Telehealth: Payer: Self-pay | Admitting: Neurology

## 2017-08-31 NOTE — Telephone Encounter (Signed)
Spoke with Romie Minus.   She states patient is doing a lot worse since he was seen in the office. He is having a lot more episodes of freezing (that's what it sounds like). He is using a walker, which doesn't seem to be helping the walking. Having trouble getting out of a chair. Also complaints of no appetite - she is unsure if having issues with swallowing.   She isn't sure what medications he is taking and when. She will get a list of medications and times he is taking them and call me back so we can go over this information.

## 2017-08-31 NOTE — Telephone Encounter (Signed)
Romie Minus pt's girlfriend called and said pt's medication that Dr [name] prescribed is not working and is requesting a sooner appointment with Dr Tat to discuss medication, pt is having trouble walking and moving, not eating,coughing a lot at night, complaining of legs, pt not doing well CB# [phone]

## 2017-09-01 NOTE — Telephone Encounter (Signed)
Spoke with Dr. Carles Collet - she states patient should take medication as follows:  Pramipexole - decrease to 1 tablet BID for one week Then one tablet daily for one week Then stop medication  At the same time increase Carbidopa Levodopa 25/100 IR to: 2 in the morning, one in the afternoon, one in the evening for one week Then 2 in the morning, 2 in the afternoon, one in the evening and stay there.  LMOM for Romie Minus to call me back to give her medication directions.

## 2017-09-01 NOTE — Telephone Encounter (Signed)
Find out if still on pramipexole.  If so, dosage?  I may try to decrease that and increase carbidopa/levodopa

## 2017-09-01 NOTE — Telephone Encounter (Signed)
Is he taking proper times of day (last one he was taking at bedtime).  Is someone else distributing meds?  It would be helpful to him and me if she (or whomever managing meds) could come back next visit

## 2017-09-01 NOTE — Telephone Encounter (Signed)
Correct.  carbidopa/levodopa 25/100 1 po tid

## 2017-09-01 NOTE — Telephone Encounter (Signed)
Patient's girlfriend came into the office this morning to go over medication list. When I printed list it stated patient was to be taking the following:  Carbidopa Levodopa 25/100 IR - 2 tablets TID Pramipexole 1 mg - 2 tablets BID Azilect - 0.5 mg - 1 tablet daily Nuplazid 34 mg - 1 daily Clonazepam 0.5 mg - 1 daily  She stated that he was taking the above except only taking Carbidopa Levodopa 1 - TID and not taking Clonazepam.   I advised them to take 2 tablets TID as instructed to help with symptoms of rigidity and freezing. After she left I reviewed the last office note and realized it does not match medication list and it does state patient should take Carbidopa Levodopa 1 TID. Please advise dosage patient should be taking given symptoms.

## 2017-09-01 NOTE — Telephone Encounter (Signed)
Please advise on patient's symptoms and what I should instruct family.

## 2017-09-01 NOTE — Telephone Encounter (Signed)
Yes, they are being given at appropriate times - AM, noon, 4-5 pm. Patient has someone administering medications.

## 2017-09-02 NOTE — Addendum Note (Signed)
Addended byAnnamaria Helling on: 09/02/2017 09:56 AM   Modules accepted: Orders

## 2017-09-02 NOTE — Telephone Encounter (Signed)
Spoke with Romie Minus and gave her directions for medication. She was confused, so I made a calendar for his titration scheduled and left it at the front for her to pick up.

## 2017-09-18 ENCOUNTER — Other Ambulatory Visit: Payer: Self-pay

## 2017-09-18 ENCOUNTER — Emergency Department (HOSPITAL_COMMUNITY)
Admission: EM | Admit: 2017-09-18 | Discharge: 2017-09-19 | Disposition: A | Discharge status: Home / Self Care | Payer: Medicare HMO | Attending: Physician Assistant | Admitting: Physician Assistant

## 2017-09-18 DIAGNOSIS — M79605 Pain in left leg: Secondary | ICD-10-CM

## 2017-09-18 DIAGNOSIS — Z87891 Personal history of nicotine dependence: Secondary | ICD-10-CM | POA: Insufficient documentation

## 2017-09-18 DIAGNOSIS — Z79899 Other long term (current) drug therapy: Secondary | ICD-10-CM | POA: Diagnosis not present

## 2017-09-18 DIAGNOSIS — M79669 Pain in unspecified lower leg: Secondary | ICD-10-CM

## 2017-09-18 DIAGNOSIS — M79604 Pain in right leg: Secondary | ICD-10-CM | POA: Diagnosis present

## 2017-09-19 ENCOUNTER — Emergency Department (HOSPITAL_BASED_OUTPATIENT_CLINIC_OR_DEPARTMENT_OTHER)
Admit: 2017-09-19 | Discharge: 2017-09-19 | Disposition: A | Discharge status: Home / Self Care | Payer: Medicare HMO | Attending: Emergency Medicine | Admitting: Emergency Medicine

## 2017-09-19 ENCOUNTER — Emergency Department (HOSPITAL_COMMUNITY): Payer: Medicare HMO

## 2017-09-19 ENCOUNTER — Encounter (HOSPITAL_COMMUNITY): Payer: Self-pay | Admitting: Emergency Medicine

## 2017-09-19 DIAGNOSIS — M79609 Pain in unspecified limb: Secondary | ICD-10-CM | POA: Diagnosis not present

## 2017-09-19 MED ORDER — ONDANSETRON 4 MG PO TBDP
4.0000 mg | ORAL_TABLET | Freq: Once | ORAL | Status: AC
Start: 2017-09-19 — End: 2017-09-19
  Administered 2017-09-19: 4 mg via ORAL
  Filled 2017-09-19: qty 1

## 2017-09-19 MED ORDER — HYDROCODONE-ACETAMINOPHEN 5-325 MG PO TABS: 1.0000 | ORAL_TABLET | Freq: Four times a day (QID) | ORAL | 0 refills | Status: DC | PRN

## 2017-09-19 MED ORDER — HYDROCODONE-ACETAMINOPHEN 5-325 MG PO TABS
2.0000 | ORAL_TABLET | Freq: Once | ORAL | Status: AC
  Administered 2017-09-19: 2 via ORAL
  Filled 2017-09-19: qty 2

## 2017-09-19 NOTE — ED Triage Notes (Signed)
Pt c/o R knee pain/swelling x 1 week radiating up mid thigh, denies injury.  Pt also c/o muscle spasms to L hand for "a long time" pt states he has been seen for this a few times but felt he would not be able to sleep tonight.

## 2017-09-19 NOTE — ED Provider Notes (Signed)
TIME SEEN: 4:08 AM  CHIEF COMPLAINT: Right leg pain  HPI: Patient is a 72 year old male with history of Parkinson's disease, hyperlipidemia who presents to the emergency department several days of right leg pain.  Describes it as an achy pain is worse with walking.  He feels his right leg is more swollen compared to the left.  No history of PE or DVT.  Denies chest pain or shortness of breath.  No redness, warmth.  No numbness or tingling.  Able to ambulate with a cane.  ROS: See HPI Constitutional: no fever  Eyes: no drainage  ENT: no runny nose   Cardiovascular:  no chest pain  Resp: no SOB  GI: no vomiting GU: no dysuria Integumentary: no rash  Allergy: no hives  Musculoskeletal: no leg swelling  Neurological: no slurred speech ROS otherwise negative  PAST MEDICAL HISTORY/PAST SURGICAL HISTORY:  Past Medical History:  Diagnosis Date  . Disorders of bursae and tendons in shoulder region, unspecified   . Other and unspecified hyperlipidemia   . Other and unspecified hyperlipidemia   . 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 Parkinson's disease ([place])   . Unspecified vitamin D deficiency   . Unspecified vitamin D deficiency     MEDICATIONS:  Prior to Admission medications   Medication Sig Start Date End Date Taking? Authorizing Provider  albuterol (PROVENTIL HFA;VENTOLIN HFA) 108 (90 BASE) MCG/ACT inhaler Inhale 2 puffs into the lungs every 6 (six) hours as needed for wheezing or shortness of breath. 07/04/13   Lauree Chandler, NP  baclofen (LIORESAL) 20 MG tablet Take 20 mg by mouth 4 (four) times daily.    [provider]  carbidopa-levodopa (SINEMET IR) 25-100 MG tablet Take 2 tablets by mouth 3 (three) times daily. Patient taking differently: 2 in the morning, 2 in the afternoon, 1 in the evening 12/03/16   Penumalli, Earlean Polka, MD  Cholecalciferol (VITAMIN D PO) Take 1,000 Units by mouth daily.     [provider]  clonazePAM (KLONOPIN) 0.5 MG  tablet Take 1 tablet (0.5 mg total) by mouth at bedtime. 12/03/16   Penumalli, Earlean Polka, MD  HYDROcodone-acetaminophen (NORCO/VICODIN) 5-325 MG tablet Take 1 tablet by mouth every 6 (six) hours as needed for moderate pain.    [provider]  meclizine (ANTIVERT) 25 MG tablet Take 25 mg by mouth daily as needed for dizziness.  05/11/16   [provider]  Multiple Vitamins-Minerals (MULTIVITAMIN WITH MINERALS) tablet Take 1 tablet by mouth daily.    [provider]  Pimavanserin Tartrate (NUPLAZID) 34 MG CAPS Take 34 mg by mouth daily. 08/11/17   Tat, Eustace Quail, DO  rasagiline (AZILECT) 0.5 MG TABS tablet Take 1 tablet (0.5 mg total) by mouth daily. 02/24/17   Penumalli, Earlean Polka, MD    ALLERGIES:  No Known Allergies  SOCIAL HISTORY:  Social History   Tobacco Use  . Smoking status: Former Research scientist (life sciences)  . Smokeless tobacco: Never Used  Substance Use Topics  . Alcohol use: Yes    Comment: occasionally ("a sip every now and then")    FAMILY HISTORY: Family History  Problem Relation Age of Onset  . Cancer Mother   . Cancer Father   . Stroke Brother   . Cancer Sister   . Cancer Sister   . Colon cancer Neg Hx   . Esophageal cancer Neg Hx   . Rectal cancer Neg Hx   . Stomach cancer Neg Hx     EXAM: BP Marland Kitchen)  141/68 (BP Location: Right Arm)   Pulse 72   Temp 98.6 F (37 C) (Oral)   Resp 18   Wt 106.6 kg (235 lb)   SpO2 100%   BMI 32.32 kg/m  CONSTITUTIONAL: Alert and oriented and responds appropriately to questions. Well-appearing; well-nourished HEAD: Normocephalic EYES: Conjunctivae clear, pupils appear equal, EOMI ENT: normal nose; moist mucous membranes NECK: Supple, no meningismus, no nuchal rigidity, no LAD  CARD: RRR; S1 and S2 appreciated; no murmurs, no clicks, no rubs, no gallops RESP: Normal chest excursion without splinting or tachypnea; breath sounds clear and equal bilaterally; no wheezes, no rhonchi, no rales, no hypoxia or respiratory distress,  speaking full sentences ABD/GI: Normal bowel sounds; non-distended; soft, non-tender, no rebound, no guarding, no peritoneal signs, no hepatosplenomegaly BACK:  The back appears normal and is non-tender to palpation, there is no CVA tenderness EXT: Tender to palpation over the right anterior and lateral thigh without redness, warmth.  I do not notice any appreciable swelling compared to the left side.  No tenderness over the right knee, tibia or fibula, ankle, foot.  2+ DP pulses bilaterally.  Normal ROM in all joints; otherwise extremities are non-tender to palpation; no edema; normal capillary refill; no cyanosis, no calf tenderness or swelling    SKIN: Normal color for age and race; warm; no rash NEURO: Moves all extremities equally PSYCH: The patient's mood and manner are appropriate. Grooming and personal hygiene are appropriate.  MEDICAL DECISION MAKING: Patient here with right leg pain.  Will obtain x-rays to rule out any occult fracture given he is elderly.  We will also obtain venous Doppler to rule out DVT.  Nothing to suggest cellulitis, arterial obstruction, gout at this time.  No signs of septic arthritis.  Will give Vicodin for pain control and reassess.  ED PROGRESS: Pain is improved.  X-rays show no acute abnormality.  Doppler pending.  Signed out to oncoming EDP to follow up on [place] results.  If negative, patient to follow up with PCP for further evaluation.   I reviewed all nursing notes, vitals, pertinent previous records, EKGs, lab and urine results, imaging (as available).      Amica Harron, Delice Bison, DO 09/19/17 [phone]

## 2017-09-19 NOTE — Progress Notes (Signed)
VASCULAR LAB PRELIMINARY  PRELIMINARY  PRELIMINARY  PRELIMINARY  Right lower extremity venous duplex completed.    Preliminary report:  There is no DVT or SVT noted in the right lower extremity.   Adriana Lina, RVT 09/19/2017, 8:36 AM

## 2017-09-19 NOTE — ED Notes (Signed)
Dr. Thomasene Lot at bedside.

## 2017-09-19 NOTE — ED Notes (Signed)
Male in room is stating that patient needs to be admitted to facility, and that he is unable to "do for himself." Notified EDP.

## 2017-09-19 NOTE — Progress Notes (Signed)
CSW spoke with pt and friend at bedside. CSW explained to both that CSW would have RNCM to set up [facility] services for pt. Pt's friend expressed that she needed to be somewhere else at this time therefore CSW took information down to expedite the process. CSW to speak with RNCM about [facility] services.     Virgie Dad Cathyann Kilfoyle, MSW, [place] Clinical Social Worker [phone]

## 2017-09-19 NOTE — Discharge Planning (Signed)
Theodore Winnick J. Clydene Laming, RN, BSN, [employer] [phone] Spoke with pt at bedside regarding discharge planning for [facility]. Offered pt list of home health agencies to choose from.  Pt chose [facility] to render services. Theodore King of [facility] notified. Patient made aware that [facility] will be in contact in 24-48 hours.  No DME needs identified at this time.

## 2017-09-19 NOTE — ED Provider Notes (Signed)
Signed out pending DVT study.  DVT study was negative.  Amatory with normal vital signs at time of discharge.  Will discharge home.   Macarthur Critchley, MD 09/19/17 [phone]

## 2017-09-19 NOTE — Discharge Instructions (Signed)
Very happy to report that your DVT study and your x-rays were normal.  Please follow-up with your primary care for further concerns.  Please keep a close eye on your symptoms and return with any change, concerning, increased pain, swelling, or other concerns.

## 2017-09-27 NOTE — Progress Notes (Signed)
Theodore King was seen today in the movement disorders clinic for neurologic consultation at the request of Theodore Dy, MD.  The consultation is for the evaluation of PD.  Pt currently under the care of Dr. Leta King.  The records that were made available to me were reviewed.  Pts girlfriend in the waiting room but he refused to allow her in (I asked few times).  His first symptom was left greater than right upper extremity tremor in 2012.  It started after a tear in the rotator cuff, which he thought was the initial cause.  Around that time, he also developed shuffling gait.  It appears that he started levodopa around that time and was not sure that it was beneficial.  In 2013, he started on pramipexole and did think it was beneficial for tremor.  He did notice, however, that with the course of time when he increased levodopa that it did seem to be helpful.  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  He last saw Dr. Leta King in June, 2018.  He has been seen at the emergency room several times since then.  He went to the emergency room on December 2 with complaints of leg spasm and increasing tremors.  He admitted he had not taken his Parkinson's medication the day he was seen in the emergency room (seen around noon).  He had also been given baclofen by his primary care physician, but has not picked that up from the pharmacy.  The biggest issue, according to the emergency room, was noncompliance with medication (also noted in Theodore King notes).  Once given medication in the emergency room he felt better.  He was discharged to Theodore King.  He came back to the emergency room on June 25, 2017 for similar complaints of generalized weakness.  No weakness was found on examination and he was discharged home.  He was given a walker.  Pt reports that he is on carbidopa/levodopa 25/100, 1 three times per day.  He is supposed to be on 2 po tid.  He doesn't know other  meds as daughter takes care of them.  Pramipexole and azilect are listed.    Specific Symptoms:  Tremor: Yes.   Family hx of similar:  No. Voice: "I have a tremor in my voice." Sleep: trouble getting to sleep  Vivid Dreams:  Yes.   - "Its like I see things"  Acting out dreams:  No. Wet Pillows: Yes.   Postural symptoms:  Yes.    Falls?  Yes.  , states that he has had 2 falls.  Last fall was about 2 weeks ago.  He was bending over to put on shoes and he kept going.  Didn't get hurt Bradykinesia symptoms: shuffling gait, slow movements and difficulty getting out of a chair Loss of smell:  No. Loss of taste:  No. Urinary Incontinence:  yes but has urinary urgency and occasionally will wear depends if goes out of town Difficulty Swallowing:  No. Handwriting, micrographia: Yes.   Trouble with ADL's:  No.  Trouble buttoning clothing: No. Depression:  No., but "I get angry at myself" Memory changes:  No. - patient denies memory change.  Daughter (23y/o) lives with the patient.  Has a girlfriend.  Puts med in pillbox and girlfriend prepares it.  Daughter does grocery shopping and pays bills.  Pt does not "but I haven't gotten my license back yet."  He cannot tell me why he lost it but may  be from memory. Hallucinations:  Yes (initially denies and then says "I see people but I know there are not people."  visual distortions: yes - "I think something is crawling on the floor and then I realize nothing is there" N/V:  No. Lightheaded:  No.  Syncope: No. Diplopia:  Yes.   Dyskinesia:  No.  Neuroimaging of the brain has previously been performed.  It is available for my review today.  Had MRI brain in 2013 and it showed rare T2 hyperintensity.    09/29/17 update: Patient was worked in today for a follow-up.  He is seen sooner than expected.  He is accompanied by his girlfriend who supplements the history.  He is also on Azilect.  Nuplazid was started last visit.  There has been much confusion over  this medication and he/his daughter were both advised to call Theodore King and we have given the many samples.  I received a call at the end of March complaining about more difficulty walking and episodes of freezing.  Confusion about what he was actually taking and how it was prescribed persisted.  Ultimately, it turns out that the patient was still on pramipexole 1 mg, 2 tablets twice per day.  I decreased that to 1 tablet twice a day for a week and then once per day for a week and then discontinued that medication. He is off of that and has been for 2 weeks.   At the same time, I increased his carbidopa/levodopa 25/1 100 to 2 tablets in the morning, 2 in the afternoon (having trouble remembering that and taking it at 2-3pm instead of 11am-12pm)  and 1 in the evening.  Girlfriend reports that he couldn't even get dressed on his own this AM but now that medication has kicked in he can walk normally.  He has trouble at night and she has to lift his legs in the bed.  Sounds like he has wearing off of medication.  He went into a restaurant bathroom and then could not get out of the restaurant bathroom and she did not know what to do since it was a men's bathroom.  She calls these "spells."  Reports that he is taking baclofen 4 times per day, 20 mg each.  Been on that for "at least 2-3 months."  He is having hallucination once a day or once every 2 days.  He never mentions it to his girlfriend.  He recently had a hallucination when he looked out the window and thought someone was outside taking something apart.  He is very active at night.  Supposed to be on clonazepam, but really has not been taking it.  He is screaming at night and jerking.  Lives with daughter who is at school but stays with girlfriend 3-4 days per week.     He was seen in the emergency room on September 19, 2017.  Those records are reviewed.  He was seen for leg pain and swelling.  Ultrasound was negative for DVT and he was discharged home.    PREVIOUS  MEDICATIONS: Sinemet  ALLERGIES:  No Known Allergies  CURRENT MEDICATIONS:  Outpatient Encounter Medications as of 09/29/2017  Medication Sig  . albuterol (PROVENTIL HFA;VENTOLIN HFA) 108 (90 BASE) MCG/ACT inhaler Inhale 2 puffs into the lungs every 6 (six) hours as needed for wheezing or shortness of breath.  . Aspirin-Acetaminophen (GOODYS BODY PAIN PO) Take by mouth.  . baclofen (LIORESAL) 20 MG tablet Take 20 mg by mouth 4 (four) times daily  as needed for muscle spasms.   . carbidopa-levodopa (SINEMET IR) 25-100 MG tablet Take 2 tablets by mouth 3 (three) times daily. (Patient taking differently: Take 1-2 tablets by mouth 3 (three) times daily. 2 in the morning, 2 in the afternoon, 1 in the evening)  . HYDROcodone-acetaminophen (NORCO/VICODIN) 5-325 MG tablet Take 1 tablet by mouth every 6 (six) hours as needed.  . meclizine (ANTIVERT) 25 MG tablet Take 25 mg by mouth daily as needed for dizziness.   . metFORMIN (GLUCOPHAGE) 500 MG tablet Take 500 mg by mouth daily with breakfast.   . Pimavanserin Tartrate (NUPLAZID) 34 MG CAPS Take 34 mg by mouth daily.  . rasagiline (AZILECT) 0.5 MG TABS tablet Take 1 tablet (0.5 mg total) by mouth daily.  . [DISCONTINUED] Cholecalciferol (VITAMIN D PO) Take 1,000 Units by mouth daily.   . [DISCONTINUED] clonazePAM (KLONOPIN) 0.5 MG tablet Take 1 tablet (0.5 mg total) by mouth at bedtime.  . [DISCONTINUED] Multiple Vitamins-Minerals (MULTIVITAMIN WITH MINERALS) tablet Take 1 tablet by mouth daily.  . carbidopa-levodopa (SINEMET CR) 50-200 MG tablet Take 1 tablet by mouth at bedtime.   No facility-administered encounter medications on file as of 09/29/2017.     PAST MEDICAL HISTORY:   Past Medical History:  Diagnosis Date  . Disorders of bursae and tendons in shoulder region, unspecified   . Other and unspecified hyperlipidemia   . Other and unspecified hyperlipidemia   . 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 Parkinson's disease ([place])    . Unspecified vitamin D deficiency   . Unspecified vitamin D deficiency     PAST SURGICAL HISTORY:   Past Surgical History:  Procedure Laterality Date  . APPENDECTOMY    . ROTATOR CUFF REPAIR     right    SOCIAL HISTORY:   Social History   Socioeconomic History  . Marital status: Legally Separated    Spouse name: Not on file  . Number of children: 3  . Years of education: 12th  . Highest education level: Not on file  Occupational History    Employer: RETIRED  Social Needs  . Financial resource strain: Not on file  . Food insecurity:    Worry: Not on file    Inability: Not on file  . Transportation needs:    Medical: Not on file    Non-medical: Not on file  Tobacco Use  . Smoking status: Former Research scientist (life sciences)  . Smokeless tobacco: Never Used  Substance and Sexual Activity  . Alcohol use: Yes    Comment: occasionally ("a sip every now and then")  . Drug use: No  . Sexual activity: Never  Lifestyle  . Physical activity:    Days per week: Not on file    Minutes per session: Not on file  . Stress: Not on file  Relationships  . Social connections:    Talks on phone: Not on file    Gets together: Not on file    Attends religious service: Not on file    Active member of club or organization: Not on file    Attends meetings of clubs or organizations: Not on file    Relationship status: Not on file  . Intimate partner violence:    Fear of current or ex partner: Not on file    Emotionally abused: Not on file    Physically abused: Not on file    Forced sexual activity: Not on file  Other Topics Concern  . Not on file  Social History Narrative  .  Not on file    FAMILY HISTORY:   Family Status  Relation Name Status  . Mother  Deceased  . Father  Deceased  . Brother Collins [place] Deceased  . Sister Kyung Bacca Deceased  . Daughter [employer]  . Son Tessa Lerner  . Sister Hulda Marin Deceased  . Brother [employer]  . Brother [employer]  . Brother [employer]    . Brother [employer]  . Brother [employer]  . Brother [employer]  . Daughter [name] Alive  . Neg Hx  (Not Specified)    ROS:  A complete 10 system review of systems was obtained and was unremarkable apart from what is mentioned above.  PHYSICAL EXAMINATION:    VITALS:   Vitals:   09/29/17 0848  BP: 132/66  Pulse: 74  SpO2: 94%  Weight: 227 lb (103 kg)  Height: 6' (1.829 m)    GEN:  The patient appears stated age and is in NAD. HEENT:  Normocephalic, atraumatic.  The mucous membranes are moist. The superficial temporal arteries are without ropiness or tenderness. CV:  RRR Lungs:  CTAB Neck/HEME:  There are no carotid bruits bilaterally.  Neurological examination:  Orientation: The patient is alert and oriented to person and place. Montreal Cognitive Assessment  07/19/2017  Visuospatial/ Executive (0/5) 1  Naming (0/3) 2  Attention: Read list of digits (0/2) 1  Attention: Read list of letters (0/1) 0  Attention: Serial 7 subtraction starting at 100 (0/3) 1  Language: Repeat phrase (0/2) 2  Language : Fluency (0/1) 1  Abstraction (0/2) 0  Delayed Recall (0/5) 0  Orientation (0/6) 4  Total 12  Adjusted Score (based on education) 13   Cranial nerves: There is good facial symmetry. Extraocular muscles are intact. The visual fields are full to confrontational testing. The speech is fluent and clear. Soft palate rises symmetrically and there is no tongue deviation. Hearing is intact to conversational tone. Sensation: Sensation is intact to light and pinprick throughout (facial, trunk, extremities). Vibration is absent at the right big toe and decreased at the L big toe.  Vibration is decreased distally. There is no extinction with double simultaneous stimulation. There is no sensory dermatomal level identified. Motor: Strength is at least antigravity x4.  Movement examination: Tone: There is normal tone in the bilateral upper extremities.  The tone in the lower extremities  is normal.  Abnormal movements: no tremor noted today.   Coordination:  There is decremation, with any form of RAMS, including alternating supination and pronation of the forearm, hand opening and closing, finger taps, heel taps and toe taps on the right Gait and Station: The patient has mild difficulty arising out of a deep-seated chair without the use of the hands. The patient's stride length is good. No shuffling.  He is just mildly unstable without the cane.     He, however, reports that he had much more trouble walking earlier this morning and he feels great right now.  ASSESSMENT/PLAN:  1.  Idiopathic Parkinson's disease, by history  -He and I discussed the importance of taking his medication on time.  We set an alarm on his phone.  He will take carbidopa/levodopa 25/100, 2 tablets at 7 AM, 2 tablets at 11 AM and 1 tablet at 4 PM.  -We will add carbidopa/levodopa 50/200 at bedtime.  -he is having freezing.  He may be an Inbrija candidate, but I am changing some of the other things that I do not want to  change this.  I did tell the patient's girlfriend that she can have him chew an extra carbidopa/levodopa 25/100 prn  -Patient is unsure of other medications.  Pramipexole is listed at a total of 4 mg daily (2 mg twice per day).  This is somewhat of an odd dosing regimen and I am not sure if he is on this.  If so, the pramipexole needs to be tapered because of memory change.  -continue azilect for now  -Discussed social situation again.  He should not be left alone.  He needs absolute assistance with medications.  He does not live with his girlfriend, but stays with her a few nights per week.  She works 2 days at a week and cannot help him on those days.  His daughter does live with him, but she and the girlfriend do not necessarily get along manage things together.  Daughter also goes to college.  Met with our social worker today to help see if we can navigate the situation.  2.  PDD with  hallucinations  -continue Nuplazid, 34 mg daily.  Risks, benefits, side effects and alternative therapies were discussed.  The opportunity to ask questions was given and they were answered to the best of my ability.  The patient expressed understanding and willingness to follow the outlined treatment protocols.  -going to decrease the bacofen.  He is on 20 mg qid.  Will contribute to confusion/hallucinations.  Decrease to bid for 2 weeks and the q hs for 2 weeks and then d/c.  I think he was using this for rigidity.  3.  REM behavior disorder  -This is commonly associated with PD and the patient is experiencing this.  We discussed that this can be very serious and even harmful.  We talked about medications as well as physical barriers to put in the bed (particularly soft bed rails, pillow barriers).  We talked about moving the night stand so that it is not so close to the side of the bed.  He has Klonopin but turns out not taking this.  Told him to take it regularly.  Told him to try 0.25 mg and see if that works.  Risks, benefits, side effects and alternative therapies were discussed.  The opportunity to ask questions was given and they were answered to the best of my ability.  The patient expressed understanding and willingness to follow the outlined treatment protocols.  4.  I will see him in about 6 weeks, sooner should new neurologic issues arise.  Much greater than 50% of this visit was spent in counseling and coordinating care.  Total face to face time:  25 min   Cc:  Theodore Dy, MD

## 2017-09-29 ENCOUNTER — Encounter: Payer: Self-pay | Admitting: Neurology

## 2017-09-29 ENCOUNTER — Encounter: Payer: Self-pay | Admitting: Psychology

## 2017-09-29 ENCOUNTER — Ambulatory Visit (INDEPENDENT_AMBULATORY_CARE_PROVIDER_SITE_OTHER): Payer: Medicare HMO | Admitting: Neurology

## 2017-09-29 VITALS — BP 132/66 | HR 74 | Ht 72.0 in | Wt 227.0 lb

## 2017-09-29 DIAGNOSIS — G20 Parkinson's disease: Secondary | ICD-10-CM

## 2017-09-29 DIAGNOSIS — F0280 Dementia in other diseases classified elsewhere without behavioral disturbance: Secondary | ICD-10-CM

## 2017-09-29 DIAGNOSIS — G4752 REM sleep behavior disorder: Secondary | ICD-10-CM | POA: Diagnosis not present

## 2017-09-29 DIAGNOSIS — R441 Visual hallucinations: Secondary | ICD-10-CM

## 2017-09-29 MED ORDER — CARBIDOPA-LEVODOPA ER 50-200 MG PO TBCR: 1.0000 | EXTENDED_RELEASE_TABLET | Freq: Every day | ORAL | 1 refills | Status: DC

## 2017-09-29 NOTE — Patient Instructions (Addendum)
1. Decrease Baclofen as follows:   Take 20 mg twice daily for two weeks.  Then 20 mg at night for two weeks.   Then stop.   2. Change Carbidopa Levodopa 25/100 IR to: 2 tablets at 7 am, 2 tablets at 11 am, 1 tablet at 4 pm.   3. Add Carbidopa Levodopa 50/200 CR- 1 tablet one hour before bedtime.   4. Restart Clonazepam 0.5 mg tablets - 1/2 tablet at bedtime.

## 2017-09-29 NOTE — Progress Notes (Signed)
I met with the patient and his girlfriend today while they were in the clinic.  We worked on creating a reminder system using his cell phone and setting alarms so he had the opportunity to be more compliant with his carbidopa levodopa.  He reports that he is went to rock steady boxing 3 times and was planning to go today.  The patient has an adult daughter that he lives with some of the time and the alternate time he stays with his girlfriend.    There seems to be some discord between the girlfriend and the patient's daughter which is leading to some uncoordinated care for the patient.  The patient could benefit from having some with him every day during the day.  This is not possible if the daughter goes to school full-time and works.  In addition, his girlfriend works 2 times a week.  I will continue to look into other alternatives to help the patient with medication compliance and coordination of care.

## 2017-10-03 ENCOUNTER — Other Ambulatory Visit: Payer: Self-pay | Admitting: Neurology

## 2017-10-03 NOTE — Telephone Encounter (Signed)
*  STAT* If patient is at the pharmacy, call can be transferred to refill team.  1.     Which medications need to be refilled? (please list name of each medication and dose if know) clonazepam0.5mg    2.     Which pharmacy/location (including street and city if local pharmacy) is medication to be sent to? [facility] [place]   3.     Do they need a 30 or 90 day supply? [place]

## 2017-10-04 MED ORDER — CLONAZEPAM 0.5 MG PO TABS
0.2500 mg | ORAL_TABLET | Freq: Every day | ORAL | 5 refills | Status: DC
Start: 2017-10-04 — End: 2018-01-16

## 2017-10-04 NOTE — Telephone Encounter (Signed)
Dr. Carles Collet - can you send order please?

## 2017-10-12 NOTE — Progress Notes (Signed)
Theodore King was seen today in the movement disorders clinic for neurologic consultation at the request of [name], MD.  The consultation is for the evaluation of PD.  Pt currently under the care of Dr. [name].  The records that were made available to me were reviewed.  Pts girlfriend in the waiting room but he refused to allow her in (I asked few times).  His first symptom was left greater than right upper extremity tremor in 2012.  It started after a tear in the rotator cuff, which he thought was the initial cause.  Around that time, he also developed shuffling gait.  It appears that he started levodopa around that time and was not sure that it was beneficial.  In 2013, he started on pramipexole and did think it was beneficial for tremor.  He did notice, however, that with the course of time when he increased levodopa that it did seem to be helpful.  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  He last saw Dr. [name] in June, 2018.  He has been seen at the emergency room several times since then.  He went to the emergency room on December 2 with complaints of leg spasm and increasing tremors.  He admitted he had not taken his Parkinson's medication the day he was seen in the emergency room (seen around noon).  He had also been given baclofen by his primary care physician, but has not picked that up from the pharmacy.  The biggest issue, according to the emergency room, was noncompliance with medication (also noted in Dr. Gladstone Lighter notes).  Once given medication in the emergency room he felt better.  He was discharged to [facility].  He came back to the emergency room on June 25, 2017 for similar complaints of generalized weakness.  No weakness was found on examination and he was discharged home.  He was given a walker.  Pt reports that he is on carbidopa/levodopa 25/100, 1 three times per day.  He is supposed to be on 2 po tid.  He doesn't know other  meds as daughter takes care of them.  Pramipexole and azilect are listed.    Specific Symptoms:  Tremor: Yes.   Family hx of similar:  No. Voice: "I have a tremor in my voice." Sleep: trouble getting to sleep  Vivid Dreams:  Yes.   - "Its like I see things"  Acting out dreams:  No. Wet Pillows: Yes.   Postural symptoms:  Yes.    Falls?  Yes.  , states that he has had 2 falls.  Last fall was about 2 weeks ago.  He was bending over to put on shoes and he kept going.  Didn't get hurt Bradykinesia symptoms: shuffling gait, slow movements and difficulty getting out of a chair Loss of smell:  No. Loss of taste:  No. Urinary Incontinence:  yes but has urinary urgency and occasionally will wear depends if goes out of town Difficulty Swallowing:  No. Handwriting, micrographia: Yes.   Trouble with ADL's:  No.  Trouble buttoning clothing: No. Depression:  No., but "I get angry at myself" Memory changes:  No. - patient denies memory change.  Daughter (23y/o) lives with the patient.  Has a girlfriend.  Puts med in pillbox and girlfriend prepares it.  Daughter does grocery shopping and pays bills.  Pt does not "but I haven't gotten my license back yet."  He cannot tell me why he lost it but may  be from memory. Hallucinations:  Yes (initially denies and then says "I see people but I know there are not people."  visual distortions: yes - "I think something is crawling on the floor and then I realize nothing is there" N/V:  No. Lightheaded:  No.  Syncope: No. Diplopia:  Yes.   Dyskinesia:  No.  Neuroimaging of the brain has previously been performed.  It is available for my review today.  Had MRI brain in 2013 and it showed rare T2 hyperintensity.    09/29/17 update: Patient was worked in today for a follow-up.  He is seen sooner than expected.  He is accompanied by his girlfriend who supplements the history.  He is also on Azilect.  Nuplazid was started last visit.  There has been much confusion over  this medication and he/his daughter were both advised to call Val[facility] Hoar and we have given the many samples.  I received a call at the end of March complaining about more difficulty walking and episodes of freezing.  Confusion about what he was actually taking and how it was prescribed persisted.  Ultimately, it turns out that the patient was still on pramipexole 1 mg, 2 tablets twice per day.  I decreased that to 1 tablet twice a day for a week and then once per day for a week and then discontinued that medication. He is off of that and has been for 2 weeks.   At the same time, I increased his carbidopa/levodopa 25/1 100 to 2 tablets in the morning, 2 in the afternoon (having trouble remembering that and taking it at 2-3pm instead of 11am-12pm)  and 1 in the evening.  Girlfriend reports that he couldn't even get dressed on his own this AM but now that medication has kicked in he can walk normally.  He has trouble at night and she has to lift his legs in the bed.  Sounds like he has wearing off of medication.  He went into a restaurant bathroom and then could not get out of the restaurant bathroom and she did not know what to do since it was a men's bathroom.  She calls these "spells."  Reports that he is taking baclofen 4 times per day, 20 mg each.  Been on that for "at least 2-3 months."  He is having hallucination once a day or once every 2 days.  He never mentions it to his girlfriend.  He recently had a hallucination when he looked out the window and thought someone was outside taking something apart.  He is very active at night.  Supposed to be on clonazepam, but really has not been taking it.  He is screaming at night and jerking.  Lives with daughter who is at school but stays with girlfriend 3-4 days per week.     He was seen in the emergency room on September 19, 2017.  Those records are reviewed.  He was seen for leg pain and swelling.  Ultrasound was negative for DVT and he was discharged home.    10/13/17  update: The patient is seen today in follow-up.  His girlfriend drove him here but refused to come back to the room when asked by staff members.   He is on carbidopa/levodopa 25/100, 2 tablets at 7 AM/2 tablets at 11 AM and 1 tablet at 4 PM and last visit we added carbidopa/levodopa 50/200 at bedtime.  It is difficult to assess efficacy of this one.   He does state that the alarm  that we sit on his phone last visit has really helped him remember taking his medication.  I asked the patient last visit to check and see whether or not he was on pramipexole, 2 mg twice daily. He brings a med list in today and he is not on that.  He is still on Azilect.  We added very low-dose clonazepam last visit, 0.25 mg, for REM behavior disorder.  "I believe the dreams are gone."  He had one fall since our last visit where he tripped up step.  Didn't get hurt.  He states that hallucinations are gone (baclofen d/c at our last visit but he brings that bottle in today and he is not sure if he is on it).  He still has occasional freezing but not bad per patient.  He asks me about the "new medication" we discussed last visit.   He is going to [facility] 2 days per week.  States that he got in shoulder yesterday and feels better.    PREVIOUS MEDICATIONS: Sinemet  ALLERGIES:  No Known Allergies  CURRENT MEDICATIONS:  Outpatient Encounter Medications as of 10/13/2017  Medication Sig  . albuterol (PROVENTIL HFA;VENTOLIN HFA) 108 (90 BASE) MCG/ACT inhaler Inhale 2 puffs into the lungs every 6 (six) hours as needed for wheezing or shortness of breath.  . Aspirin-Acetaminophen (GOODYS BODY PAIN PO) Take by mouth.  . baclofen (LIORESAL) 20 MG tablet Take 20 mg by mouth 4 (four) times daily as needed for muscle spasms.   . carbidopa-levodopa (SINEMET CR) 50-200 MG tablet Take 1 tablet by mouth at bedtime.  . carbidopa-levodopa (SINEMET IR) 25-100 MG tablet Take 2 tablets by mouth 3 (three) times daily. (Patient taking differently: Take 1-2  tablets by mouth 3 (three) times daily. 2 in the morning, 2 in the afternoon, 1 in the evening)  . clonazePAM (KLONOPIN) 0.5 MG tablet Take 0.5 tablets (0.25 mg total) by mouth at bedtime.  Marland Kitchen HYDROcodone-acetaminophen (NORCO/VICODIN) 5-325 MG tablet Take 1 tablet by mouth every 6 (six) hours as needed.  . meclizine (ANTIVERT) 25 MG tablet Take 25 mg by mouth daily as needed for dizziness.   . metFORMIN (GLUCOPHAGE) 500 MG tablet Take 500 mg by mouth daily with breakfast.   . Pimavanserin Tartrate (NUPLAZID) 34 MG CAPS Take 34 mg by mouth daily.  Marland Kitchen Propylene Glycol-Glycerin (SOOTHE) 0.6-0.6 % SOLN Apply to eye.  . rasagiline (AZILECT) 0.5 MG TABS tablet Take 1 tablet (0.5 mg total) by mouth daily.   No facility-administered encounter medications on file as of 10/13/2017.     PAST MEDICAL HISTORY:   Past Medical History:  Diagnosis Date  . Disorders of bursae and tendons in shoulder region, unspecified   . Other and unspecified hyperlipidemia   . Other and unspecified hyperlipidemia   . 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 Parkinson's disease ([place])   . Unspecified vitamin D deficiency   . Unspecified vitamin D deficiency     PAST SURGICAL HISTORY:   Past Surgical History:  Procedure Laterality Date  . APPENDECTOMY    . ROTATOR CUFF REPAIR     right    SOCIAL HISTORY:   Social History   Socioeconomic History  . Marital status: Legally Separated    Spouse name: Not on file  . Number of children: 3  . Years of education: 12th  . Highest education level: Not on file  Occupational History    Employer: RETIRED  Social Needs  . Financial resource strain: Not on  file  . Food insecurity:    Worry: Not on file    Inability: Not on file  . Transportation needs:    Medical: Not on file    Non-medical: Not on file  Tobacco Use  . Smoking status: Former Research scientist (life sciences)  . Smokeless tobacco: Never Used  Substance and Sexual Activity  . Alcohol use: Yes    Comment:  occasionally ("a sip every now and then")  . Drug use: No  . Sexual activity: Never  Lifestyle  . Physical activity:    Days per week: Not on file    Minutes per session: Not on file  . Stress: Not on file  Relationships  . Social connections:    Talks on phone: Not on file    Gets together: Not on file    Attends religious service: Not on file    Active member of club or organization: Not on file    Attends meetings of clubs or organizations: Not on file    Relationship status: Not on file  . Intimate partner violence:    Fear of current or ex partner: Not on file    Emotionally abused: Not on file    Physically abused: Not on file    Forced sexual activity: Not on file  Other Topics Concern  . Not on file  Social History Narrative  . Not on file    FAMILY HISTORY:   Family Status  Relation Name Status  . Mother  Deceased  . Father  Deceased  . Brother Collins [place] Deceased  . Sister Kyung Bacca Deceased  . Daughter [employer]  . Son Tessa Lerner  . Sister Hulda Marin Deceased  . Brother [employer]  . Brother [employer]  . Brother [employer]  . Brother [employer]  . Brother [employer]  . Brother [employer]  . Daughter [name] Alive  . Neg Hx  (Not Specified)    ROS: Having some right proximal leg pain.  A complete 10 system review of systems was obtained and was unremarkable apart from what is mentioned above.  PHYSICAL EXAMINATION:    VITALS:   Vitals:   10/13/17 0839  BP: 124/76  Pulse: 68  SpO2: 94%  Weight: 224 lb (101.6 kg)  Height: 5' 11.5" (1.816 m)    GEN:  The patient appears stated age and is in NAD. HEENT:  Normocephalic, atraumatic.  The mucous membranes are moist. The superficial temporal arteries are without ropiness or tenderness. CV:  RRR Lungs:  CTAB Neck/HEME:  There are no carotid bruits bilaterally.  Neurological examination:  Orientation: The patient is alert and oriented to person and place. Montreal Cognitive Assessment   07/19/2017  Visuospatial/ Executive (0/5) 1  Naming (0/3) 2  Attention: Read list of digits (0/2) 1  Attention: Read list of letters (0/1) 0  Attention: Serial 7 subtraction starting at 100 (0/3) 1  Language: Repeat phrase (0/2) 2  Language : Fluency (0/1) 1  Abstraction (0/2) 0  Delayed Recall (0/5) 0  Orientation (0/6) 4  Total 12  Adjusted Score (based on education) 13   Cranial nerves: There is good facial symmetry. Extraocular muscles are intact. The visual fields are full to confrontational testing. The speech is fluent and clear. Soft palate rises symmetrically and there is no tongue deviation. Hearing is intact to conversational tone. Sensation: Sensation is intact to light and pinprick throughout (facial, trunk, extremities). Vibration is absent at the right big toe and decreased at the L  big toe.  Vibration is decreased distally. There is no extinction with double simultaneous stimulation. There is no sensory dermatomal level identified. Motor: Strength is at least antigravity x4.  GEN:  The patient appears stated age and is in NAD. HEENT:  Normocephalic, atraumatic.  The mucous membranes are moist. The superficial temporal arteries are without ropiness or tenderness. CV:  RRR Lungs:  CTAB Neck/HEME:  There are no carotid bruits bilaterally.    Movement examination: Tone: There is normal tone in the RUE.  There is normal tone in the LUE.  There is normal tone in the RLE.  There is normal tone in the LLE.  Abnormal movements: None Coordination:  There is decremation with RAM's, with any form of RAMS, including alternating supination and pronation of the forearm, hand opening and closing, finger taps, heel taps and toe taps on the right Gait and Station: The patient has mild difficulty arising out of a deep-seated chair without the use of the hands.  He has mild start hesitation.  After that, he actually walks very well down the hall with good stride length.      ASSESSMENT/PLAN:  1.  Idiopathic Parkinson's disease, by history  -Patient will continue carbidopa/levodopa, 2 tablets at 7 AM, 2 tablets at 11 AM and 1 tablet at 4 PM.  -continue Carbidopa/levodopa 50/200 at bedtime.  -he is having some freezing, but reports it is better than previous.  No family here to support that.  He and I previously discussed inbrija but given lack of social support, I'm not sure that he would be able to administer this to himself or know when to use it.  He did ask me about this drug today, however, and I told him that I am not sure that I want to start that until he has better social support.  -continue azilect for now  -he is going to [facility] two days per week.  I congratulated him and encouraged him to continue.  2.  PDD with hallucinations  -continue Nuplazid, 34 mg daily.  Risks, benefits, side effects and alternative therapies were discussed.  The opportunity to ask questions was given and they were answered to the best of my ability.  The patient expressed understanding and willingness to follow the outlined treatment protocols.  -He brings in his baclofen today with the bottle that says 20 mg 4 times per day.  I had written him a weaning schedule last visit and we will try to find out from his family if he is still on this or not.  If he is, we will wean it again.  3.  REM behavior disorder  -He reports that clonazepam 0.25 mg at night has helped and has not affected daytime function or caused sleepiness.    4.  He met with our social worker again today.  She will be calling his daughter later, as visits are very difficult when his girlfriend does not wish to come back to the room and give updates that are necessary.  5.Follow up is anticipated in the next few months, sooner should new neurologic issues arise.  Much greater than 50% of this visit was spent in counseling and coordinating care.  Total face to face time:  25 min   Cc:  [name], MD

## 2017-10-13 ENCOUNTER — Encounter: Payer: Self-pay | Admitting: Neurology

## 2017-10-13 ENCOUNTER — Ambulatory Visit (INDEPENDENT_AMBULATORY_CARE_PROVIDER_SITE_OTHER): Payer: Medicare HMO | Admitting: Neurology

## 2017-10-13 ENCOUNTER — Encounter: Payer: Self-pay | Admitting: Psychology

## 2017-10-13 VITALS — BP 124/76 | HR 68 | Ht 71.5 in | Wt 224.0 lb

## 2017-10-13 DIAGNOSIS — F0280 Dementia in other diseases classified elsewhere without behavioral disturbance: Secondary | ICD-10-CM

## 2017-10-13 DIAGNOSIS — G20 Parkinson's disease: Secondary | ICD-10-CM

## 2017-10-13 NOTE — Progress Notes (Signed)
Met with patient while he was in the clinic.  He reports that the reminder on his telephone is working well to help him take the carbidopa levodopa.  It is unclear if the patient has been taking less baclofen to wean off of it or not.  The patient's girlfriend would not come into the appointment today.  After his appointment, I contacted the patient's daughter [name].  The intention of this call was to find out if the patient had been appropriately weaned off of his baclofen.  She reports that she did not know because she did not see the last AVS from his last visit.  We discussed the need for 1 person to be the leader and making sure that the patient is taking his medications appropriately and to relay any necessary information during the patient appointments.  I reviewed the plan to wean off of baclofen and will provide her with copies of the patient's AVS that has these instructions.  The plan is that she is going to bring a copy of her healthcare power of attorney.  We will place a copy on file.  I have printed out copies of his AVS from his last patient appointment on 25 April and today's appointment so the patient's daughter is up-to-date with what the recommendations are.  In addition, I provided her with the next appointment in September.  She had requested a copy of the patient's current medication list which I provided as well.  These will be left in the envelope for her to pick up.

## 2017-10-13 NOTE — Patient Instructions (Addendum)
1.  You should be off of your baclofen but it is still in your medication bag you brought to me.  Ask your girlfriend or daughter if this med is still being given in the pill box.  It should have been weaned already.

## 2017-10-28 ENCOUNTER — Encounter

## 2017-10-28 ENCOUNTER — Ambulatory Visit: Payer: Self-pay | Admitting: Neurology

## 2017-11-02 ENCOUNTER — Ambulatory Visit: Payer: Self-pay | Admitting: Neurology

## 2017-11-18 ENCOUNTER — Ambulatory Visit: Payer: Self-pay | Admitting: Neurology

## 2017-11-25 ENCOUNTER — Encounter: Payer: Self-pay | Admitting: Internal Medicine

## 2017-12-12 ENCOUNTER — Other Ambulatory Visit: Payer: Self-pay

## 2017-12-12 ENCOUNTER — Encounter (HOSPITAL_COMMUNITY): Payer: Self-pay

## 2017-12-12 ENCOUNTER — Emergency Department (HOSPITAL_COMMUNITY)
Admission: EM | Admit: 2017-12-12 | Discharge: 2017-12-13 | Disposition: A | Discharge status: Home / Self Care | Payer: Medicare HMO | Attending: Emergency Medicine | Admitting: Emergency Medicine

## 2017-12-12 ENCOUNTER — Emergency Department (HOSPITAL_COMMUNITY): Payer: Medicare HMO

## 2017-12-12 ENCOUNTER — Telehealth: Payer: Self-pay | Admitting: Neurology

## 2017-12-12 DIAGNOSIS — Z79899 Other long term (current) drug therapy: Secondary | ICD-10-CM | POA: Insufficient documentation

## 2017-12-12 DIAGNOSIS — R4781 Slurred speech: Secondary | ICD-10-CM

## 2017-12-12 DIAGNOSIS — G20 Parkinson's disease: Secondary | ICD-10-CM | POA: Insufficient documentation

## 2017-12-12 DIAGNOSIS — Z7984 Long term (current) use of oral hypoglycemic drugs: Secondary | ICD-10-CM | POA: Insufficient documentation

## 2017-12-12 DIAGNOSIS — Z87891 Personal history of nicotine dependence: Secondary | ICD-10-CM | POA: Insufficient documentation

## 2017-12-12 DIAGNOSIS — R410 Disorientation, unspecified: Secondary | ICD-10-CM | POA: Insufficient documentation

## 2017-12-12 LAB — DIFFERENTIAL
Abs Immature Granulocytes: 0 10*3/uL (ref 0.0–0.1)
Basophils Absolute: 0 10*3/uL (ref 0.0–0.1)
Basophils Relative: 0 %
Eosinophils Absolute: 0 10*3/uL (ref 0.0–0.7)
Eosinophils Relative: 1 %
Immature Granulocytes: 1 %
Lymphocytes Relative: 23 %
Lymphs Abs: 1.4 10*3/uL (ref 0.7–4.0)
Monocytes Absolute: 0.4 10*3/uL (ref 0.1–1.0)
Monocytes Relative: 6 %
Neutro Abs: 4.1 10*3/uL (ref 1.7–7.7)
Neutrophils Relative %: 69 %

## 2017-12-12 LAB — COMPREHENSIVE METABOLIC PANEL
ALT: 6 U/L (ref 0–44)
AST: 18 U/L (ref 15–41)
Albumin: 3.8 g/dL (ref 3.5–5.0)
Alkaline Phosphatase: 59 U/L (ref 38–126)
Anion gap: 9 (ref 5–15)
BUN: 10 mg/dL (ref 8–23)
CO2: 27 mmol/L (ref 22–32)
Calcium: 9.2 mg/dL (ref 8.9–10.3)
Chloride: 103 mmol/L (ref 98–111)
Creatinine, Ser: 1.02 mg/dL (ref 0.61–1.24)
GFR calc Af Amer: >60 mL/min (ref >60)
GFR calc non Af Amer: >60 mL/min (ref >60)
Glucose, Bld: 114 mg/dL — ABNORMAL HIGH (ref 70–99)
Potassium: 4.2 mmol/L (ref 3.5–5.1)
Sodium: 139 mmol/L (ref 135–145)
Total Bilirubin: 1.7 mg/dL — ABNORMAL HIGH (ref 0.3–1.2)
Total Protein: 6.7 g/dL (ref 6.5–8.1)

## 2017-12-12 LAB — I-STAT CHEM 8, ED
BUN: 13 mg/dL (ref 8–23)
Calcium, Ion: 1.15 mmol/L (ref 1.15–1.40)
Chloride: 104 mmol/L (ref 98–111)
Creatinine, Ser: 1 mg/dL (ref 0.61–1.24)
Glucose, Bld: 118 mg/dL — ABNORMAL HIGH (ref 70–99)
HCT: 40 % (ref 39.0–52.0)
Hemoglobin: 13.6 g/dL (ref 13.0–17.0)
Potassium: 4.2 mmol/L (ref 3.5–5.1)
Sodium: 140 mmol/L (ref 135–145)
TCO2: 27 mmol/L (ref 22–32)

## 2017-12-12 LAB — CBC
HCT: 40.9 % (ref 39.0–52.0)
Hemoglobin: 13.2 g/dL (ref 13.0–17.0)
MCH: 27.6 pg (ref 26.0–34.0)
MCHC: 32.3 g/dL (ref 30.0–36.0)
MCV: 85.6 fL (ref 78.0–100.0)
Platelets: 145 10*3/uL — ABNORMAL LOW (ref 150–400)
RBC: 4.78 MIL/uL (ref 4.22–5.81)
RDW: 13.5 % (ref 11.5–15.5)
WBC: 5.9 10*3/uL (ref 4.0–10.5)

## 2017-12-12 LAB — I-STAT TROPONIN, ED: Troponin i, poc: 0 ng/mL (ref 0.00–0.08)

## 2017-12-12 NOTE — ED Provider Notes (Signed)
Patient placed in Quick Look pathway, seen and evaluated   Chief Complaint: Confusion, slurred speech  HPI:   Patient brought in by his significant other with complaint of confusion and slurred speech for 4 days.  She states that his symptoms began on 4 July and have waxed and waned since then.  He had another episode of slurred speech and confusion this morning in which he was reaching for things that were not there and he told her that they were going to the [facility] when they in fact were not.  He does have a history of Parkinson's and sometimes does not take his medications as prescribed.  She also notes that he has had some difficulty ambulating.  He denies numbness, tingling, or weakness.  ROS: Positive for confusion, dysarthric speech Negative for fevers, numbness, tingling, or weakness  Physical Exam:   Gen: No distress  Psych: appropriate mood and affect  Skin: Warm    Focused Exam: Possibly mildly dysarthric speech, mild drooping of the left lips but eyebrows raise symmetrically and tongue extension without deviation.  No fasciculations.  Sensation intact to soft touch of the face and extremities.  5/5 strength of BUE and BLE major muscle groups.  He ambulates with a steady gait.  No pronator drift noted.  EOMs normal.  Peripheral vision intact.  Alert and oriented to person, place, and time as well as events.   Initiation of care has begun. The patient has been counseled on the process, plan, and necessity for staying for the completion/evaluation, and the remainder of the medical screening examination    Debroah Baller 12/12/17 [place], Kevin, MD 12/13/17 1610

## 2017-12-12 NOTE — ED Triage Notes (Signed)
Pt in with wife reporting intermittent confusion and gait changes, last episode started this morning, went to their PCP office and was advised to come here. No focal neuro deficits noted, pt answering all questions appropriately, wife describes pt confusion as short term memory loss, triage RN notified and PA will be to bedside to screen for code stroke.

## 2017-12-12 NOTE — Telephone Encounter (Signed)
Patient and his girlfriend walked into office stating patient having slurred speech and disorientation since 12/08/2017. Got better over the weekend but then worse again today.   Per Dr. Carles Collet who was verbally made aware patient was instructed to be seen in the ER regarding his symptoms.

## 2017-12-12 NOTE — ED Triage Notes (Signed)
Pt here with wife endorses slurred speech that has been going on for 2 weeks. Wife states that he began having difficulty walking at 1000 this morning and "he was reaching for different things that weren't there and was disoriented and the slurred speech returned" Has hx of parkinsin's. VSS. Axox4 with this RN. No unilateral weakness. Slight left sided facial droop. PA in room assessing pt with this RN.

## 2017-12-12 NOTE — ED Provider Notes (Signed)
[facility] EMERGENCY DEPARTMENT Provider Note   CSN: [id number] Arrival date & time: 12/12/17  1343     History   Chief Complaint Chief Complaint  Patient presents with  . Aphasia    HPI Theodore King is a 72 y.o. male.  HPI  This is a 71 year old male with a history of Parkinson's disease who presents with confusion and slurred speech.  Patient is without complaint and denies any focal deficit.  He does report tingling over the right arm and leg which he attributes to his Parkinson's.  Per his significant other, he has had increasing confusion and slurred speech.  This began July 4 and has come and gone.  He had another episode this morning where he was confused and was reaching for things that were not there.  He also was disoriented to where they were.  She has noted some difficulty ambulating.  Patient is oriented to person and place but not time.  Denies headache, recent fever, chest pain, shortness of breath.  Past Medical History:  Diagnosis Date  . Disorders of bursae and tendons in shoulder region, unspecified   . Other and unspecified hyperlipidemia   . Other and unspecified hyperlipidemia   . 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 Parkinson's disease ([place])   . Unspecified vitamin D deficiency   . Unspecified vitamin D deficiency     Patient Active Problem List   Diagnosis Date Noted  . Vertigo, benign paroxysmal 11/05/2016  . Abnormal dreams 09/06/2013  . 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 Hyperglycemia 05/18/2013  . Paralysis agitans ([place]) 05/18/2013  . Vitamin D deficiency 05/18/2013  . Hyperlipidemia LDL goal < 100 05/18/2013  . Parkinson's disease ([place]) 10/26/2012    Past Surgical History:  Procedure Laterality Date  . APPENDECTOMY    . ROTATOR CUFF REPAIR     right        Home Medications    Prior to Admission medications   Medication Sig Start Date End Date Taking? Authorizing Provider  albuterol  (PROVENTIL HFA;VENTOLIN HFA) 108 (90 BASE) MCG/ACT inhaler Inhale 2 puffs into the lungs every 6 (six) hours as needed for wheezing or shortness of breath. 07/04/13  Yes Lauree Chandler, NP  carbidopa-levodopa (SINEMET CR) 50-200 MG tablet Take 1 tablet by mouth at bedtime. 09/29/17  Yes Tat, Eustace Quail, DO  Aspirin-Acetaminophen (GOODYS BODY PAIN PO) Take by mouth.    [provider]  baclofen (LIORESAL) 20 MG tablet Take 20 mg by mouth 4 (four) times daily as needed for muscle spasms.     [provider]  carbidopa-levodopa (SINEMET IR) 25-100 MG tablet Take 2 tablets by mouth 3 (three) times daily. Patient taking differently: Take 1-2 tablets by mouth 3 (three) times daily. 2 in the morning, 2 in the afternoon, 1 in the evening 12/03/16   Penumalli, Earlean Polka, MD  clonazePAM (KLONOPIN) 0.5 MG tablet Take 0.5 tablets (0.25 mg total) by mouth at bedtime. 10/04/17   Tat, Eustace Quail, DO  HYDROcodone-acetaminophen (NORCO/VICODIN) 5-325 MG tablet Take 1 tablet by mouth every 6 (six) hours as needed. 09/19/17   Ward, Delice Bison, DO  meclizine (ANTIVERT) 25 MG tablet Take 25 mg by mouth daily as needed for dizziness.  05/11/16   [provider]  metFORMIN (GLUCOPHAGE) 500 MG tablet Take 500 mg by mouth daily with breakfast.     [provider]  Pimavanserin Tartrate (NUPLAZID) 34 MG CAPS Take 34 mg by mouth daily.  08/11/17   Tat, Eustace Quail, DO  Propylene Glycol-Glycerin (SOOTHE) 0.6-0.6 % SOLN Apply to eye.    [provider]  rasagiline (AZILECT) 0.5 MG TABS tablet Take 1 tablet (0.5 mg total) by mouth daily. 02/24/17   Penumalli, Earlean Polka, MD    Family History Family History  Problem Relation Age of Onset  . Cancer Mother   . Cancer Father   . Stroke Brother   . Cancer Sister   . Cancer Sister   . Colon cancer Neg Hx   . Esophageal cancer Neg Hx   . Rectal cancer Neg Hx   . Stomach cancer Neg Hx     Social History Social History   Tobacco Use  .  Smoking status: Former Research scientist (life sciences)  . Smokeless tobacco: Never Used  Substance Use Topics  . Alcohol use: Yes    Comment: occasionally ("a sip every now and then")  . Drug use: No     Allergies   Patient has no known allergies.   Review of Systems Review of Systems  Constitutional: Negative for fever.  Respiratory: Negative for shortness of breath.   Cardiovascular: Negative for chest pain.  Gastrointestinal: Negative for abdominal pain, nausea and vomiting.  Genitourinary: Negative for dysuria.  Musculoskeletal:       Right arm and leg pain  Neurological: Positive for speech difficulty. Negative for headaches.  Psychiatric/Behavioral: Positive for confusion.  All other systems reviewed and are negative.    Physical Exam Updated Vital Signs BP (!) 145/75   Pulse (!) 55   Temp 98.3 F (36.8 C) (Oral)   Resp 17   Ht 6' (1.829 m)   Wt 108.2 kg (238 lb 8 oz)   SpO2 99%   BMI 32.35 kg/m   Physical Exam  Constitutional:  Chronically ill-appearing but nontoxic, no acute distress  HENT:  Head: Normocephalic and atraumatic.  Eyes: Pupils are equal, round, and reactive to light.  Neck: Neck supple.  Cardiovascular: Normal rate, regular rhythm and normal heart sounds.  No murmur heard. Pulmonary/Chest: Effort normal and breath sounds normal. No respiratory distress. He has no wheezes.  Abdominal: Soft. Bowel sounds are normal. There is no tenderness. There is no rebound.  Musculoskeletal: He exhibits edema.  Neurological: He is alert.  Oriented x2, slight left-sided facial droop noted, 5 out of 5 strength bilateral upper and lower extremities, no drift, no dysmetria to finger-nose-finger  Skin: Skin is warm and dry.  Psychiatric: He has a normal mood and affect.  Nursing note and vitals reviewed.    ED Treatments / Results  Labs (all labs ordered are listed, but only abnormal results are displayed) Labs Reviewed  CBC - Abnormal; Notable for the following components:        Result Value   Platelets 145 (*)    All other components within normal limits  COMPREHENSIVE METABOLIC PANEL - Abnormal; Notable for the following components:   Glucose, Bld 114 (*)    Total Bilirubin 1.7 (*)    All other components within normal limits  I-STAT CHEM 8, ED - Abnormal; Notable for the following components:   Glucose, Bld 118 (*)    All other components within normal limits  DIFFERENTIAL  PROTIME-INR  APTT  URINALYSIS, ROUTINE W REFLEX MICROSCOPIC  I-STAT TROPONIN, ED  CBG MONITORING, ED    EKG EKG Interpretation  Date/Time:  Monday December 12 2017 13:46:26 EDT Ventricular Rate:  69 PR Interval:  234 QRS Duration: 96 QT Interval:  384 QTC  Calculation: 411 R Axis:   13 Text Interpretation:  Sinus rhythm with 1st degree A-V block Minimal voltage criteria for LVH, may be normal variant Borderline ECG Confirmed by Thayer Jew [phone]) on 12/12/2017 9:40:46 PM   Radiology Ct Head Wo Contrast  Result Date: 12/12/2017 CLINICAL DATA:  Acute onset of confusion and slurred speech that began 4 days ago. EXAM: CT HEAD WITHOUT CONTRAST TECHNIQUE: Contiguous axial images were obtained from the base of the skull through the vertex without intravenous contrast. COMPARISON:  MRI brain 09/09/2011.  CT head 12/11/2005. FINDINGS: Brain: Ventricular system normal in size and appearance for age. Mild age-appropriate cortical atrophy. Minimal changes of small vessel disease of the periventricular white matter, unchanged from the prior MRI. No mass lesion. No midline shift. No acute hemorrhage or hematoma. No extra-axial fluid collections. No evidence of acute infarction. Vascular: Mild BILATERAL carotid siphon and LEFT vertebral artery atherosclerosis. No hyperdense vessel. Skull: No skull fracture or other focal osseous abnormality involving the skull. Sinuses/Orbits: Visualized paranasal sinuses, bilateral mastoid air cells and bilateral middle ear cavities well-aerated. Visualized  orbits and globes normal in appearance. Other: Minimal calcified pannus at the C1-C2 articulation without evidence of spinal stenosis. IMPRESSION: 1. No acute intracranial abnormality. 2. Mild age-appropriate cortical atrophy and minimal chronic microvascular ischemic changes of the periventricular white matter. Electronically Signed   By: Evangeline Dakin M.D.   On: 12/12/2017 16:34   Mr Brain Wo Contrast  Result Date: 12/12/2017 CLINICAL DATA:  Initial evaluation for acute altered mental status, slurred speech for 4 days. EXAM: MRI HEAD WITHOUT CONTRAST TECHNIQUE: Multiplanar, multiecho pulse sequences of the brain and surrounding structures were obtained without intravenous contrast. COMPARISON:  Prior CT from earlier same day. FINDINGS: Brain: Generalized age-related cerebral atrophy. No significant cerebral white matter changes for age. No abnormal foci of restricted diffusion to suggest acute or subacute ischemia. Gray-white matter differentiation maintained. No encephalomalacia to suggest chronic infarction. No abnormal foci of susceptibility artifact to suggest acute or chronic intracranial hemorrhage. No mass lesion, midline shift or mass effect. No hydrocephalus. No extra-axial fluid collection. Pituitary gland within normal limits. Vascular: Major intracranial vascular flow voids are maintained at the skull base. Skull and upper cervical spine: Degenerative thickening at the tectorial membrane noted. Craniocervical junction otherwise unremarkable. Bone marrow signal intensity within normal limits. No scalp soft tissue abnormality. Sinuses/Orbits: Globes and orbital soft tissues demonstrate no acute finding. Patient status post ocular lens replacement bilaterally. Paranasal sinuses are clear. No mastoid effusion. Inner ear structures normal. Other: None. IMPRESSION: Normal brain MRI for age. No acute intracranial abnormality identified. Electronically Signed   By: Jeannine Boga M.D.   On:  12/12/2017 23:27    Procedures Procedures (including critical care time)  Medications Ordered in ED Medications - No data to display   Initial Impression / Assessment and Plan / ED Course  I have reviewed the triage vital signs and the nursing notes.  Pertinent labs & imaging results that were available during my care of the patient were reviewed by me and considered in my medical decision making (see chart for details).     Presents with reported confusion.  He is oriented x2.  He does appear to have a slight left-sided facial droop.  Unclear whether this is acute.  He is otherwise nontoxic-appearing and vital signs are reassuring.  Otherwise he is neurologically intact.  He does have a history of Parkinson's disease.  ET scan is negative.  Lab work reviewed and largely reassuring.  MRI ordered given duration of symptoms.  MRI does not show any evidence of acute stroke.  Given the confusion is one of the prominent features, will obtain urinalysis to ensure no infection.  Additionally, advancement of Parkinson's and/or dementia is a consideration.  Analysis is pending.  Final Clinical Impressions(s) / ED Diagnoses   Final diagnoses:  None    ED Discharge Orders    None       Horton, Barbette Hair, MD 12/12/17 [phone]

## 2017-12-12 NOTE — ED Notes (Signed)
Patient transported to MRI 

## 2017-12-12 NOTE — ED Notes (Signed)
After further assessment, pt's sx began 07/04, and have continued since becoming intermittently worse at times. Not a code stroke.

## 2017-12-13 LAB — URINALYSIS, ROUTINE W REFLEX MICROSCOPIC
Bacteria, UA: NONE SEEN
Bilirubin Urine: NEGATIVE
Glucose, UA: NEGATIVE mg/dL
Ketones, ur: NEGATIVE mg/dL
Nitrite: NEGATIVE
Protein, ur: NEGATIVE mg/dL
Specific Gravity, Urine: 1.01 (ref 1.005–1.030)
pH: 5 (ref 5.0–8.0)

## 2017-12-13 LAB — CBG MONITORING, ED: Glucose-Capillary: 89 mg/dL (ref 70–99)

## 2017-12-13 NOTE — ED Notes (Signed)
Pt ambulated in hall with little assistance and no complications.

## 2017-12-13 NOTE — ED Provider Notes (Signed)
I assumed care of this patient from Dr. Dina Rich at [facility].  Please see their note for further details of Hx, PE.  Briefly patient is a 72 y.o. male who presented with confusion, aphasia, and facial droop who had negative MRI. This felt to be likely related to parkinson's, but planned to check UA to assess for infection  Current plan is to follow up UA.  UA not suspicious for UTI. Ambulated at baseline.  The patient is safe for discharge with strict return precautions.  Disposition: Discharge  Condition: Good  I have discussed the results, Dx and Tx plan with the patient and family who expressed understanding and agree(s) with the plan. Discharge instructions discussed at great length. The patient and family was given strict return precautions who verbalized understanding of the instructions. No further questions at time of discharge.    ED Discharge Orders    None       Follow Up: [name], [place], [place] [address] [phone]  Schedule an appointment as soon as possible for a visit    Neurology  Schedule an appointment as soon as possible for a visit          Cardama, Grayce Sessions, MD 12/13/17 [phone]

## 2017-12-14 LAB — URINE CULTURE

## 2017-12-19 ENCOUNTER — Telehealth: Payer: Self-pay | Admitting: Neurology

## 2017-12-19 NOTE — Telephone Encounter (Signed)
Left message on machine for patient's daughter to call back.   

## 2017-12-19 NOTE — Telephone Encounter (Signed)
Patent's daughter called and is needing a call back regarding his medications. Thanks

## 2017-12-20 NOTE — Telephone Encounter (Signed)
Received after hours note that patient's daughter called back yesterday at 5:20 pm.   "Caller states she has questions about any changes to her father's medicines. Her father told her he didn't have to take some of them anymore. She is his caregiver and needs to know."   Will call her back to discuss.

## 2017-12-20 NOTE — Telephone Encounter (Signed)
Spoke with daughter and made aware no changes in medications from [place].  She will pick up his  Medication list - this is at the front for her.

## 2017-12-26 ENCOUNTER — Other Ambulatory Visit: Payer: Self-pay | Admitting: Diagnostic Neuroimaging

## 2017-12-28 ENCOUNTER — Other Ambulatory Visit: Payer: Self-pay | Admitting: Diagnostic Neuroimaging

## 2018-01-05 ENCOUNTER — Other Ambulatory Visit: Payer: Self-pay | Admitting: Diagnostic Neuroimaging

## 2018-01-06 ENCOUNTER — Other Ambulatory Visit: Payer: Self-pay | Admitting: Diagnostic Neuroimaging

## 2018-01-09 ENCOUNTER — Other Ambulatory Visit: Payer: Self-pay | Admitting: Diagnostic Neuroimaging

## 2018-01-09 ENCOUNTER — Ambulatory Visit (HOSPITAL_COMMUNITY)
Admission: EM | Admit: 2018-01-09 | Discharge: 2018-01-09 | Disposition: A | Discharge status: Home / Self Care | Payer: Medicare HMO | Attending: Family Medicine | Admitting: Family Medicine

## 2018-01-09 ENCOUNTER — Encounter (HOSPITAL_COMMUNITY): Payer: Self-pay

## 2018-01-09 DIAGNOSIS — R531 Weakness: Secondary | ICD-10-CM | POA: Diagnosis not present

## 2018-01-09 LAB — POCT I-STAT, CHEM 8
BUN: 12 mg/dL (ref 8–23)
Calcium, Ion: 1.23 mmol/L (ref 1.15–1.40)
Chloride: 105 mmol/L (ref 98–111)
Creatinine, Ser: 0.9 mg/dL (ref 0.61–1.24)
Glucose, Bld: 93 mg/dL (ref 70–99)
HCT: 41 % (ref 39.0–52.0)
Hemoglobin: 13.9 g/dL (ref 13.0–17.0)
Potassium: 3.8 mmol/L (ref 3.5–5.1)
Sodium: 141 mmol/L (ref 135–145)
TCO2: 26 mmol/L (ref 22–32)

## 2018-01-09 MED ORDER — TIZANIDINE HCL 2 MG PO CAPS: 4.0000 mg | ORAL_CAPSULE | Freq: Three times a day (TID) | ORAL | 1 refills | Status: DC

## 2018-01-09 NOTE — Discharge Instructions (Signed)
It was nice meeting you!!   The blood test was normal.  Please follow up with PCP as discussed.  You may want to look into applying for medicaid if you want long term care placement.  Or you can call your insurance company and  check what they offer.

## 2018-01-09 NOTE — Progress Notes (Signed)
Pt's girlfriend, Romie Minus walked in to the office. She states they went to PCP due to Pt being so weak. He is with her, but unable to get out of the car. PCP's office instructed them to take Pt to [facility]. She wanted to stop here and clarify medications. I advised her Pt's daughter had recently gotten a print out. Romie Minus had a print out, but says she's confused about the last dose of Sinemet before bed because it was different. Pt taking 25/100 2-2-1, then 50/200 at bedtime. I advised her that was correct.  She said again how weak Pt was, and unable to walk. She was hoping he would be admitted and trx to [facility] again. She said it helped him greatly previously. She asked if she should take him to Urgent care or ED. I advised her if he is unable to walk, she should have him evaluated at the ED.

## 2018-01-09 NOTE — Progress Notes (Signed)
Agree.  Theodore King, Theodore King

## 2018-01-09 NOTE — ED Provider Notes (Signed)
[facility]    CSN: [id number] Arrival date & time: 01/09/18  1313     History   Chief Complaint Chief Complaint  Patient presents with  . Weakness    HPI Theodore King is a 72 y.o. male.   Pt is a 72 year old male with PMH of Parkinson's disease. He presents today with family and concern of increased weakness in the legs, difficulty walking and getting in and out of bed and cramping in the legs. Family reports that he has not been taking his medications as prescribed. He lives at home alone and forgets to take his medications. Family is requesting that he be placed in a nursing facility for rehab or have somebody come into the home to help. He was in [facility] back in December for 1 month and family said that he improved from the PT. The problem is that he is getting worse again and more forgetful. His complaint today is cramping in the right upper thigh and muscle spasm. Sometimes the same thing occurs in the left thigh.  He reports that when he stands his leg cramp up and it takes about 5 minutes until he can walk. He denies any fever, chills. He denies chest pain, SOB, dizziness, headaches or changes in vision.   ROS per HPI      Past Medical History:  Diagnosis Date  . Disorders of bursae and tendons in shoulder region, unspecified   . Other and unspecified hyperlipidemia   . Other and unspecified hyperlipidemia   . 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 Parkinson's disease ([place])   . Unspecified vitamin D deficiency   . Unspecified vitamin D deficiency     Patient Active Problem List   Diagnosis Date Noted  . Vertigo, benign paroxysmal 11/05/2016  . Abnormal dreams 09/06/2013  . 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 Hyperglycemia 05/18/2013  . Paralysis agitans ([place]) 05/18/2013  . Vitamin D deficiency 05/18/2013  . Hyperlipidemia LDL goal < 100 05/18/2013  . Parkinson's disease ([place]) 10/26/2012    Past Surgical History:    Procedure Laterality Date  . APPENDECTOMY    . ROTATOR CUFF REPAIR     right       Home Medications    Prior to Admission medications   Medication Sig Start Date End Date Taking? Authorizing Provider  albuterol (PROVENTIL HFA;VENTOLIN HFA) 108 (90 BASE) MCG/ACT inhaler Inhale 2 puffs into the lungs every 6 (six) hours as needed for wheezing or shortness of breath. 07/04/13   Lauree Chandler, NP  Aspirin-Acetaminophen (GOODYS BODY PAIN PO) Take by mouth.    [provider]  baclofen (LIORESAL) 20 MG tablet Take 20 mg by mouth 4 (four) times daily as needed for muscle spasms.     [provider]  carbidopa-levodopa (SINEMET CR) 50-200 MG tablet Take 1 tablet by mouth at bedtime. 09/29/17   Tat, Eustace Quail, DO  carbidopa-levodopa (SINEMET IR) 25-100 MG tablet Take 2 tablets by mouth 3 (three) times daily. Patient taking differently: Take 1-2 tablets by mouth 3 (three) times daily. 2 in the morning, 2 in the afternoon, 1 in the evening 12/03/16   Penumalli, Earlean Polka, MD  clonazePAM (KLONOPIN) 0.5 MG tablet Take 0.5 tablets (0.25 mg total) by mouth at bedtime. 10/04/17   Tat, Eustace Quail, DO  HYDROcodone-acetaminophen (NORCO/VICODIN) 5-325 MG tablet Take 1 tablet by mouth every 6 (six) hours as needed. 09/19/17   Ward, Delice Bison, DO  meclizine (ANTIVERT) 25  MG tablet Take 25 mg by mouth daily as needed for dizziness.  05/11/16   [provider]  metFORMIN (GLUCOPHAGE) 500 MG tablet Take 500 mg by mouth daily with breakfast.     [provider]  Pimavanserin Tartrate (NUPLAZID) 34 MG CAPS Take 34 mg by mouth daily. 08/11/17   Tat, Eustace Quail, DO  Propylene Glycol-Glycerin (SOOTHE) 0.6-0.6 % SOLN Apply to eye.    [provider]  rasagiline (AZILECT) 0.5 MG TABS tablet Take 1 tablet (0.5 mg total) by mouth daily. 02/24/17   Penumalli, Earlean Polka, MD  tizanidine (ZANAFLEX) 2 MG capsule Take 2 capsules (4 mg total) by mouth 3 (three) times daily. 01/09/18   Orvan July, NP    Family History Family History  Problem Relation Age of Onset  . Cancer Mother   . Cancer Father   . Stroke Brother   . Cancer Sister   . Cancer Sister   . Colon cancer Neg Hx   . Esophageal cancer Neg Hx   . Rectal cancer Neg Hx   . Stomach cancer Neg Hx     Social History Social History   Tobacco Use  . Smoking status: Former Research scientist (life sciences)  . Smokeless tobacco: Never Used  Substance Use Topics  . Alcohol use: Yes    Comment: occasionally ("a sip every now and then")  . Drug use: No     Allergies   Patient has no known allergies.   Review of Systems Review of Systems   Physical Exam Triage Vital Signs ED Triage Vitals [01/09/18 1408]  Enc Vitals Group     BP (!) 145/69     Pulse Rate 71     Resp 20     Temp 98.8 F (37.1 C)     Temp Source Temporal     SpO2 99 %     Weight      Height      Head Circumference      Peak Flow      Pain Score      Pain Loc      Pain Edu?      Excl. in [place]?    No data found.  Updated Vital Signs BP (!) 145/69 (BP Location: Left Arm)   Pulse 71   Temp 98.8 F (37.1 C) (Temporal)   Resp 20   SpO2 99%   Visual Acuity Right Eye Distance:   Left Eye Distance:   Bilateral Distance:    Right Eye Near:   Left Eye Near:    Bilateral Near:     Physical Exam  Constitutional: He appears well-developed and well-nourished. No distress.  HENT:  Head: Normocephalic and atraumatic.  Cardiovascular: Normal rate and regular rhythm.  Pulmonary/Chest: Effort normal and breath sounds normal.  Musculoskeletal: He exhibits no edema, tenderness or deformity.  Pt able to ambulate in the exam room using cane. Very slow gait. Limited ROM.   Neurological: He is alert. No sensory deficit.  Skin: Skin is warm and dry.  Psychiatric: He has a normal mood and affect.  Nursing note and vitals reviewed.    UC Treatments / Results  Labs (all labs ordered are listed, but only abnormal results are displayed) Labs Reviewed    POCT I-STAT, CHEM 8    EKG None  Radiology No results found.  Procedures Procedures (including critical care time)  Medications Ordered in UC Medications - No data to display  Initial Impression / Assessment and Plan / UC  Course  I have reviewed the triage vital signs and the nursing notes.  Pertinent labs & imaging results that were available during my care of the patient were reviewed by me and considered in my medical decision making (see chart for details).     Spoke with social work about pt situation and they spoke that pt was at [facility] back in December on a LOG. His insurance would not pay for the rehab. This is only allowed one time for the hospital to pay for rehab. If pt wants long term placement then he will need to apply for Medicaid. His insurance will cover someone to come out to the house a few days a week for a few hours.   Spoke with family about the options. I will check an I stat today to check electrolytes based on the patients complaints but otherwise this is a chronic issue that needs to be addressed by PCP or neurology. If he gets worse over the next few days he can go to the hospital for further evaluation.   I stat normal. Pt requesting different muscle relaxer than the one he had in the past. Will try Zanaflex to see if this helps with the muscle spasms.  Will have pt follow up with PCP or go to the ER for worsening symptoms. Pt and family agreeable to plan.  Final Clinical Impressions(s) / UC Diagnoses   Final diagnoses:  Generalized weakness     Discharge Instructions     It was nice meeting you!!   The blood test was normal.  Please follow up with PCP as discussed.  You may want to look into applying for medicaid if you want long term care placement.  Or you can call your insurance company and  check what they offer.      ED Prescriptions    Medication Sig Dispense Auth. Provider   tizanidine (ZANAFLEX) 2 MG capsule Take 2 capsules (4  mg total) by mouth 3 (three) times daily. 30 capsule Loura Halt A, NP     Controlled Substance Prescriptions [place] Controlled Substance Registry consulted? Not Applicable   Orvan July, NP 01/09/18 [phone]

## 2018-01-09 NOTE — ED Triage Notes (Signed)
Pt presents with general body and extremity weakness

## 2018-01-11 ENCOUNTER — Telehealth: Payer: Self-pay | Admitting: Psychology

## 2018-01-11 ENCOUNTER — Other Ambulatory Visit: Payer: Self-pay

## 2018-01-11 NOTE — Telephone Encounter (Signed)
TC with patient's daughter, [name].  Family meeting scheduled for 11:00 on Friday to talk about patient's needs and resources available.  Telephone call to patient to make him aware of appointment with social worker.  He stated that he will let his girlfriend know of this date and time.  Attempted to call the girlfriend could not leave a message.

## 2018-01-11 NOTE — Telephone Encounter (Signed)
Telephone call to patient's daughter Ashvin Adelson and left a message.  Attempting to talk to her in reference to the suggestion from the discharge plans of the ED of either applying for Medicaid for long-term care and/or the social work noting that his insurance would cover for someone to be in the home a couple hours a day a few days a week.  I am not sure if patient has long-term care insurance and if not I am unfamiliar with personal care service hours available through insurance if not long-term care insurance related.  I would like to talk with the patient's daughter to help coordinate any resources that will be beneficial to the patient.  I indicated on the message that I will be out of the office Wednesday afternoon and Thursday. However, will be in on Friday and able to talk with her more in detail. If I do not hear back from patient's daughter upon my return, I will contact patients girlfriend to see if I can arrange a meeting with both she and the patient together in my office.

## 2018-01-12 NOTE — Progress Notes (Signed)
Theodore King was seen today in the movement disorders clinic for neurologic consultation at the request of [name], MD.  The consultation is for the evaluation of PD.  Pt currently under the care of Dr. [name].  The records that were made available to me were reviewed.  Pts girlfriend in the waiting room but he refused to allow her in (I asked few times).  His first symptom was left greater than right upper extremity tremor in 2012.  It started after a tear in the rotator cuff, which he thought was the initial cause.  Around that time, he also developed shuffling gait.  It appears that he started levodopa around that time and was not sure that it was beneficial.  In 2013, he started on pramipexole and did think it was beneficial for tremor.  He did notice, however, that with the course of time when he increased levodopa that it did seem to be helpful.  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  He last saw Dr. [name] in June, 2018.  He has been seen at the emergency room several times since then.  He went to the emergency room on December 2 with complaints of leg spasm and increasing tremors.  He admitted he had not taken his Parkinson's medication the day he was seen in the emergency room (seen around noon).  He had also been given baclofen by his primary care physician, but has not picked that up from the pharmacy.  The biggest issue, according to the emergency room, was noncompliance with medication (also noted in Dr. Gladstone Lighter notes).  Once given medication in the emergency room he felt better.  He was discharged to [facility].  He came back to the emergency room on June 25, 2017 for similar complaints of generalized weakness.  No weakness was found on examination and he was discharged home.  He was given a walker.  Pt reports that he is on carbidopa/levodopa 25/100, 1 three times per day.  He is supposed to be on 2 po tid.  He doesn't know other  meds as daughter takes care of them.  Pramipexole and azilect are listed.    Specific Symptoms:  Tremor: Yes.   Family hx of similar:  No. Voice: "I have a tremor in my voice." Sleep: trouble getting to sleep  Vivid Dreams:  Yes.   - "Its like I see things"  Acting out dreams:  No. Wet Pillows: Yes.   Postural symptoms:  Yes.    Falls?  Yes.  , states that he has had 2 falls.  Last fall was about 2 weeks ago.  He was bending over to put on shoes and he kept going.  Didn't get hurt Bradykinesia symptoms: shuffling gait, slow movements and difficulty getting out of a chair Loss of smell:  No. Loss of taste:  No. Urinary Incontinence:  yes but has urinary urgency and occasionally will wear depends if goes out of town Difficulty Swallowing:  No. Handwriting, micrographia: Yes.   Trouble with ADL's:  No.  Trouble buttoning clothing: No. Depression:  No., but "I get angry at myself" Memory changes:  No. - patient denies memory change.  Daughter (23y/o) lives with the patient.  Has a girlfriend.  Puts med in pillbox and girlfriend prepares it.  Daughter does grocery shopping and pays bills.  Pt does not "but I haven't gotten my license back yet."  He cannot tell me why he lost it but may  be from memory. Hallucinations:  Yes (initially denies and then says "I see people but I know there are not people."  visual distortions: yes - "I think something is crawling on the floor and then I realize nothing is there" N/V:  No. Lightheaded:  No.  Syncope: No. Diplopia:  Yes.   Dyskinesia:  No.  Neuroimaging of the brain has previously been performed.  It is available for my review today.  Had MRI brain in 2013 and it showed rare T2 hyperintensity.    09/29/17 update: Patient was worked in today for a follow-up.  He is seen sooner than expected.  He is accompanied by his girlfriend who supplements the history.  He is also on Azilect.  Nuplazid was started last visit.  There has been much confusion over  this medication and he/his daughter were both advised to call [facility] Hoar and we have given the many samples.  I received a call at the end of March complaining about more difficulty walking and episodes of freezing.  Confusion about what he was actually taking and how it was prescribed persisted.  Ultimately, it turns out that the patient was still on pramipexole 1 mg, 2 tablets twice per day.  I decreased that to 1 tablet twice a day for a week and then once per day for a week and then discontinued that medication. He is off of that and has been for 2 weeks.   At the same time, I increased his carbidopa/levodopa 25/1 100 to 2 tablets in the morning, 2 in the afternoon (having trouble remembering that and taking it at 2-3pm instead of 11am-12pm)  and 1 in the evening.  Girlfriend reports that he couldn't even get dressed on his own this AM but now that medication has kicked in he can walk normally.  He has trouble at night and she has to lift his legs in the bed.  Sounds like he has wearing off of medication.  He went into a restaurant bathroom and then could not get out of the restaurant bathroom and she did not know what to do since it was a men's bathroom.  She calls these "spells."  Reports that he is taking baclofen 4 times per day, 20 mg each.  Been on that for "at least 2-3 months."  He is having hallucination once a day or once every 2 days.  He never mentions it to his girlfriend.  He recently had a hallucination when he looked out the window and thought someone was outside taking something apart.  He is very active at night.  Supposed to be on clonazepam, but really has not been taking it.  He is screaming at night and jerking.  Lives with daughter who is at school but stays with girlfriend 3-4 days per week.     He was seen in the emergency room on September 19, 2017.  Those records are reviewed.  He was seen for leg pain and swelling.  Ultrasound was negative for DVT and he was discharged home.    10/13/17  update: The patient is seen today in follow-up.  His girlfriend drove him here but refused to come back to the room when asked by staff members.   He is on carbidopa/levodopa 25/100, 2 tablets at 7 AM/2 tablets at 11 AM and 1 tablet at 4 PM and last visit we added carbidopa/levodopa 50/200 at bedtime.  It is difficult to assess efficacy of this one.   He does state that the alarm  that we sit on his phone last visit has really helped him remember taking his medication.  I asked the patient last visit to check and see whether or not he was on pramipexole, 2 mg twice daily. He brings a med list in today and he is not on that.  He is still on Azilect.  We added very low-dose clonazepam last visit, 0.25 mg, for REM behavior disorder.  "I believe the dreams are gone."  He had one fall since our last visit where he tripped up step.  Didn't get hurt.  He states that hallucinations are gone (baclofen d/c at our last visit but he brings that bottle in today and he is not sure if he is on it).  He still has occasional freezing but not bad per patient.  He asks me about the "new medication" we discussed last visit.   He is going to [facility] 2 days per week.  States that he got in shoulder yesterday and feels better.    01/16/18 update: Patient follows up today for Parkinson's disease and dementia.  Pts accompanied by girlfriend who supplements the hx.  He is supposed to be on carbidopa/levodopa 25/100, 2 tablets at 7 AM, 2 tablets at 11 AM, 1 tablet at 4 PM and carbidopa/levodopa 50/200 at bedtime.  However, girlfriend reports that pharmacy wouldn't give her the IR (pharmacy not open so didn't call) and so she is instead giving him the carbidopa/levodopa 50/200 at 7am/11am/4pm.  This has been so for about a week.  He remains on Azilect as well.  In regards REM behavior disorder, low-dose clonazepam, 0.25 mg, has helped.  He ran out of this about 2 nights ago per girlfriend. Records have been reviewed since our last visit.  There  have been several calls/correspondences with his family.  He has also been in the emergency room in July and in August.  In July, patient was in the emergency room for confusion and slurred speech.  MRI of the brain was obtained on December 12, 2017 and was unremarkable.  Patient was in the emergency room again on January 09, 2017 with generalized weakness.  Family was hoping for nursing placement for rehab.  Social work at the hospital investigated and stated that his insurance would not pay for rehab.  Zanaflex was given for him for muscle spasms. They report he is not taking the zanaflex.   However, when he describes it, he is actually having freezing of the right leg.  Having trouble turning over in the bed at night and getting out of chairs.  No hallucinations.  On nuplazid.    PREVIOUS MEDICATIONS: Sinemet  ALLERGIES:  No Known Allergies  CURRENT MEDICATIONS:  Outpatient Encounter Medications as of 01/16/2018  Medication Sig  . albuterol (PROVENTIL HFA;VENTOLIN HFA) 108 (90 BASE) MCG/ACT inhaler Inhale 2 puffs into the lungs every 6 (six) hours as needed for wheezing or shortness of breath.  . Aspirin-Acetaminophen (GOODYS BODY PAIN PO) Take by mouth.  . baclofen (LIORESAL) 20 MG tablet Take 20 mg by mouth 4 (four) times daily as needed for muscle spasms.   . carbidopa-levodopa (SINEMET CR) 50-200 MG tablet Take 1 tablet by mouth at bedtime.  . carbidopa-levodopa (SINEMET IR) 25-100 MG tablet Take 2 tablets by mouth 3 (three) times daily. (Patient taking differently: Take 1-2 tablets by mouth 3 (three) times daily. 2 in the morning, 2 in the afternoon, 1 in the evening)  . clonazePAM (KLONOPIN) 0.5 MG tablet Take 0.5 tablets (0.25  mg total) by mouth at bedtime.  Marland Kitchen HYDROcodone-acetaminophen (NORCO/VICODIN) 5-325 MG tablet Take 1 tablet by mouth every 6 (six) hours as needed.  . meclizine (ANTIVERT) 25 MG tablet Take 25 mg by mouth daily as needed for dizziness.   . metFORMIN (GLUCOPHAGE) 500 MG tablet  Take 500 mg by mouth daily with breakfast.   . Pimavanserin Tartrate (NUPLAZID) 34 MG CAPS Take 34 mg by mouth daily.  Marland Kitchen Propylene Glycol-Glycerin (SOOTHE) 0.6-0.6 % SOLN Apply to eye.  . rasagiline (AZILECT) 0.5 MG TABS tablet Take 1 tablet (0.5 mg total) by mouth daily.  . tizanidine (ZANAFLEX) 2 MG capsule Take 2 capsules (4 mg total) by mouth 3 (three) times daily.   No facility-administered encounter medications on file as of 01/16/2018.     PAST MEDICAL HISTORY:   Past Medical History:  Diagnosis Date  . Disorders of bursae and tendons in shoulder region, unspecified   . Other and unspecified hyperlipidemia   . Other and unspecified hyperlipidemia   . 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 Parkinson's disease ([place])   . Unspecified vitamin D deficiency   . Unspecified vitamin D deficiency     PAST SURGICAL HISTORY:   Past Surgical History:  Procedure Laterality Date  . APPENDECTOMY    . ROTATOR CUFF REPAIR     right    SOCIAL HISTORY:   Social History   Socioeconomic History  . Marital status: Legally Separated    Spouse name: Not on file  . Number of children: 3  . Years of education: 12th  . Highest education level: Not on file  Occupational History    Employer: RETIRED  Social Needs  . Financial resource strain: Not on file  . Food insecurity:    Worry: Not on file    Inability: Not on file  . Transportation needs:    Medical: Not on file    Non-medical: Not on file  Tobacco Use  . Smoking status: Former Research scientist (life sciences)  . Smokeless tobacco: Never Used  Substance and Sexual Activity  . Alcohol use: Yes    Comment: occasionally ("a sip every now and then")  . Drug use: No  . Sexual activity: Never  Lifestyle  . Physical activity:    Days per week: Not on file    Minutes per session: Not on file  . Stress: Not on file  Relationships  . Social connections:    Talks on phone: Not on file    Gets together: Not on file    Attends religious  service: Not on file    Active member of club or organization: Not on file    Attends meetings of clubs or organizations: Not on file    Relationship status: Not on file  . Intimate partner violence:    Fear of current or ex partner: Not on file    Emotionally abused: Not on file    Physically abused: Not on file    Forced sexual activity: Not on file  Other Topics Concern  . Not on file  Social History Narrative  . Not on file    FAMILY HISTORY:   Family Status  Relation Name Status  . Mother  Deceased  . Father  Deceased  . Brother Collins Scotland Deceased  . Sister Kyung Bacca Deceased  . Daughter [employer]  . Son Tessa Lerner  . Sister Hulda Marin Deceased  . Brother [employer]  . Brother [employer]  . Brother [employer]  .  Brother [employer]  . Brother [employer]  . Brother [employer]  . Daughter [name] Alive  . Neg Hx  (Not Specified)    ROS: Having some right proximal leg pain.  A complete 10 system review of systems was obtained and was unremarkable apart from what is mentioned above.  PHYSICAL EXAMINATION:    VITALS:   There were no vitals filed for this visit.  GEN:  The patient appears stated age and is in NAD. HEENT:  Normocephalic, atraumatic.  The mucous membranes are moist. The superficial temporal arteries are without ropiness or tenderness. CV:  RRR Lungs:  CTAB Neck/HEME:  There are no carotid bruits bilaterally.  Neurological examination:  Orientation: The patient is alert and oriented to person and place. Montreal Cognitive Assessment  07/19/2017  Visuospatial/ Executive (0/5) 1  Naming (0/3) 2  Attention: Read list of digits (0/2) 1  Attention: Read list of letters (0/1) 0  Attention: Serial 7 subtraction starting at 100 (0/3) 1  Language: Repeat phrase (0/2) 2  Language : Fluency (0/1) 1  Abstraction (0/2) 0  Delayed Recall (0/5) 0  Orientation (0/6) 4  Total 12  Adjusted Score (based on education) 13   Cranial nerves: There is  good facial symmetry. Extraocular muscles are intact. The visual fields are full to confrontational testing. The speech is fluent and clear. Soft palate rises symmetrically and there is no tongue deviation. Hearing is intact to conversational tone. Sensation: Sensation is intact to light and pinprick throughout (facial, trunk, extremities). Vibration is absent at the right big toe and decreased at the L big toe.  Vibration is decreased distally. There is no extinction with double simultaneous stimulation. There is no sensory dermatomal level identified. Motor: Strength is at least antigravity x4.  GEN:  The patient appears stated age and is in NAD. HEENT:  Normocephalic, atraumatic.  The mucous membranes are moist. The superficial temporal arteries are without ropiness or tenderness. CV:  RRR Lungs:  CTAB Neck/HEME:  There are no carotid bruits bilaterally.    Movement examination: Tone: There is normal tone in the RUE.  There is normal tone in the LUE.  There is normal tone in the RLE.  There is normal tone in the LLE.  Abnormal movements: None Coordination:  There is decremation with RAM's, with any form of RAMS, including alternating supination and pronation of the forearm, hand opening and closing, finger taps, heel taps and toe taps on the right Gait and Station: The patient has mild difficulty arising out of a deep-seated chair without the use of the hands.  He has mild start hesitation.  After that, he actually walks very well down the hall with good stride length.     ASSESSMENT/PLAN:  1.  Idiopathic Parkinson's disease, by history  -Patient will take carbidopa/levodopa, 2 tablets at 7 AM, 2 tablets at 11 AM and 1 tablet at 4 PM.  -continue Carbidopa/levodopa 50/200 at bedtime.  Reiterated not to substitute daytime pill for the nighttime and vice versa as currently on only CR as they substituted for IR  -he is having some freezing which are described as muscle spasm.  Talked to  him/girlfriend about etiology of freezing.  Can take extra carbidopa/levodopa 1/2 tablet as needed  -d/c azilect  -he is going to [facility] two days per week.  I congratulated him and encouraged him to continue.  -d/c zanaflex.  Think that this will only contribute to confusion.    -RX for lift chair  -  talked about 24 hour per day care.  He doesn't have this but he needs it.    -asks me about inbrija which we had discussed previously.  However, told him I don't think that he currently has the social support to know when or how to use it.  Will discuss in the future.  -met with our social worker today.  Decided to try [facility] pharmacy  2.  PDD with hallucinations  -continue Nuplazid, 34 mg daily.  Risks, benefits, side effects and alternative therapies were discussed.  The opportunity to ask questions was given and they were answered to the best of my ability.  The patient expressed understanding and willingness to follow the outlined treatment protocols.  3.  REM behavior disorder  -He reports that clonazepam 0.25 mg at night has helped and has not affected daytime function or caused sleepiness.  However, they stated they have run out and pharmacy wouldn't refill.  I will check on that (addendum: did check and pt picked it up 7/23 and should have enough currently.  Did send new RX for the pill pack pharmacy)  4.  Follow up is anticipated in the next few months, sooner should new neurologic issues arise.  Much greater than 50% of this visit was spent in counseling and coordinating care.  Total face to face time:  40 min   Cc:  [name], MD

## 2018-01-16 ENCOUNTER — Ambulatory Visit (INDEPENDENT_AMBULATORY_CARE_PROVIDER_SITE_OTHER): Payer: Medicare HMO | Admitting: Neurology

## 2018-01-16 ENCOUNTER — Encounter: Payer: Self-pay | Admitting: Neurology

## 2018-01-16 ENCOUNTER — Other Ambulatory Visit: Payer: Self-pay | Admitting: Diagnostic Neuroimaging

## 2018-01-16 ENCOUNTER — Telehealth: Payer: Self-pay | Admitting: Neurology

## 2018-01-16 ENCOUNTER — Encounter: Payer: Self-pay | Admitting: Psychology

## 2018-01-16 VITALS — BP 124/64 | HR 70 | Ht 71.5 in | Wt 220.0 lb

## 2018-01-16 DIAGNOSIS — G20 Parkinson's disease: Secondary | ICD-10-CM

## 2018-01-16 DIAGNOSIS — G4752 REM sleep behavior disorder: Secondary | ICD-10-CM | POA: Diagnosis not present

## 2018-01-16 DIAGNOSIS — F0280 Dementia in other diseases classified elsewhere without behavioral disturbance: Secondary | ICD-10-CM

## 2018-01-16 MED ORDER — AMBULATORY NON FORMULARY MEDICATION: 0 refills | Status: DC

## 2018-01-16 MED ORDER — CARBIDOPA-LEVODOPA 25-100 MG PO TABS: 2.0000 | ORAL_TABLET | Freq: Three times a day (TID) | ORAL | 1 refills | Status: DC

## 2018-01-16 MED ORDER — CLONAZEPAM 0.5 MG PO TABS: 0.2500 mg | ORAL_TABLET | Freq: Every day | ORAL | 1 refills | Status: DC

## 2018-01-16 MED ORDER — CARBIDOPA-LEVODOPA ER 50-200 MG PO TBCR: 1.0000 | EXTENDED_RELEASE_TABLET | Freq: Every day | ORAL | 1 refills | Status: DC

## 2018-01-16 NOTE — Telephone Encounter (Signed)
Spoke with patient/girlfriend about medication. Gave her the number to pill pack pharmacy [phone] to see how long it will take to get medication sent to them. They will let me know if I need to send a temporary supply to a local pharmacy.

## 2018-01-16 NOTE — Patient Instructions (Addendum)
1.  Stop rasagaline  2.  take carbidopa/levodopa, 2 tablets at 7 AM, 2 tablets at 11 AM and 1 tablet at 4 PM.  3.  Take carbidopa/levodopa 50/200 at bedtime.  4.  Do not intermix the daytime version of carbidopa/levodopa for the nighttime version.  These are different pills.  5.  We will check with your pharmacy to see about the issue with your meds.

## 2018-01-16 NOTE — Progress Notes (Signed)
I met with the patient and his girlfriend Sharyn Lull today while they were in the clinic. He reports that his alarm for his medications is working.  However, there is lack in continuity in his care in general and it appears that he has not been taking all medications as prescribed in the way they are prescribed. They are going to try to use [facility] to see if this helps with his PD medications.    The patient is staying with his daughter on Wednesday Thursdays and Fridays and then stays with Romie Minus Saturday Sunday Monday and Tuesday's.  When with his girlfriend he has some and there 24/7.  However when it has daughters he is by himself if his daughter is working or at school.  I am going to look at resources to see if there is any way that he can get some support on those Wednesday,  Thursdays and Fridays.  He currently is going to rock steady boxing on Tuesdays and Thursdays.  The identified plan is to take the medications as prescribed.  In addition, to explore ways that the patient can have someone with him on Wednesdays Thursdays and Fridays.  I will look into resources but this is also the patient's responsibility and his family's responsibility to explore.

## 2018-01-17 ENCOUNTER — Telehealth: Payer: Self-pay | Admitting: Neurology

## 2018-01-17 MED ORDER — CARBIDOPA-LEVODOPA 25-100 MG PO TABS: 2.0000 | ORAL_TABLET | Freq: Three times a day (TID) | ORAL | 0 refills | Status: DC

## 2018-01-17 NOTE — Telephone Encounter (Signed)
Patient is needing to [facility] in [facility]. She said he is completely out. She also said that it would take 3 to 4 weeks for the pill pack? Thanks

## 2018-01-17 NOTE — Telephone Encounter (Signed)
One month supply of Carbidopa Levodopa sent to local pharmacy.

## 2018-01-18 ENCOUNTER — Encounter: Payer: Self-pay | Admitting: Psychology

## 2018-01-18 NOTE — Progress Notes (Signed)
Information was put in the mail on senior resources of [place] for their caregiver resources.  In addition I's sent information on comfort [facility] DSS in-home aide assistance.  I am not sure if the patient will qualify but I want to make sure that they get these resources.  In addition, I provided information on a program through his health insurance carrier at that helps to connect caregivers or patients with more resources in the community.  I provided 3 sets of this information 1 for the patient went for the daughter and one for the girlfriend.  The primary issue for the patient at this time is the lack of continuity in care between the 2 different households that he resides.  This impacts medication compliance.  He would be best suited to have a in-home aide a couple hours on the day that he resides with his daughter [name].

## 2018-01-30 ENCOUNTER — Other Ambulatory Visit: Payer: Self-pay | Admitting: *Deleted

## 2018-01-30 ENCOUNTER — Telehealth: Payer: Self-pay | Admitting: Neurology

## 2018-01-30 MED ORDER — PIMAVANSERIN TARTRATE 34 MG PO CAPS: 34.0000 mg | ORAL_CAPSULE | Freq: Every day | ORAL | 1 refills | Status: DC

## 2018-01-30 NOTE — Telephone Encounter (Signed)
Patient friend Romie Minus calling stating pt needs refill on medication nuplazid, and usually gets it through the mail. Patient requesting call back to know if he needs to call mail order or not.

## 2018-01-30 NOTE — Telephone Encounter (Signed)
I called Theodore King to let her know that I have sent the Rx but she said that it should have already been ordered.  I gave her the number to [facility] to have them track down the Rx.

## 2018-01-31 ENCOUNTER — Telehealth: Payer: Self-pay | Admitting: Neurology

## 2018-01-31 NOTE — Telephone Encounter (Signed)
Patient's friend called regarding his medication not being called into [facility] [address]. She said he has been without the medicine for 2 weeks.  Thanks

## 2018-02-01 ENCOUNTER — Encounter

## 2018-02-01 ENCOUNTER — Ambulatory Visit: Payer: Self-pay | Admitting: Neurology

## 2018-02-01 NOTE — Telephone Encounter (Signed)
Left message on machine for patient to call back.  Trying to figure out what medication they need. Awaiting call back.

## 2018-02-02 NOTE — Telephone Encounter (Signed)
Nuplazid has to be received through specialty pharmacy. I have spoken to the family several times about this situation. I spoke with patient and daughter to let them know this and daughter will order medication.

## 2018-02-02 NOTE — Telephone Encounter (Signed)
Patients still not received medication nuplazid per friend and wants it sent to [facility] now.

## 2018-02-06 ENCOUNTER — Other Ambulatory Visit: Payer: Self-pay | Admitting: Neurology

## 2018-02-07 ENCOUNTER — Telehealth: Payer: Self-pay | Admitting: Neurology

## 2018-02-07 NOTE — Telephone Encounter (Signed)
*  STAT* If patient is at the pharmacy, call can be transferred to refill team.  1.     Which medications need to be refilled? (please list name of each medication and dose if know) Carbidopa levodopa 25-100mg    2.     Which pharmacy/location (including street and city if local pharmacy) is medication to be sent to? [facility] [address]    3.     Do they need a 30 or 90 day supply? 90 days   He also states that the nuplazid has not came in and he would like a RX sent to the [facility] as well

## 2018-02-07 NOTE — Telephone Encounter (Signed)
[facility] Lev[facility]opa this morning. I have talked several times to him and his daughter to let them know I can not call in Nuplazid to a local pharmacy. Patient's daughter has the phone number to contact the specialty pharmacy to get this sent.

## 2018-02-14 ENCOUNTER — Encounter (HOSPITAL_COMMUNITY): Payer: Self-pay | Admitting: Emergency Medicine

## 2018-02-14 ENCOUNTER — Other Ambulatory Visit: Payer: Self-pay

## 2018-02-14 ENCOUNTER — Emergency Department (HOSPITAL_COMMUNITY)
Admission: EM | Admit: 2018-02-14 | Discharge: 2018-02-14 | Disposition: A | Discharge status: Home / Self Care | Payer: Medicare HMO | Attending: Emergency Medicine | Admitting: Emergency Medicine

## 2018-02-14 DIAGNOSIS — M79604 Pain in right leg: Secondary | ICD-10-CM | POA: Diagnosis present

## 2018-02-14 DIAGNOSIS — Z87891 Personal history of nicotine dependence: Secondary | ICD-10-CM | POA: Insufficient documentation

## 2018-02-14 DIAGNOSIS — Z7984 Long term (current) use of oral hypoglycemic drugs: Secondary | ICD-10-CM | POA: Diagnosis not present

## 2018-02-14 DIAGNOSIS — G20 Parkinson's disease: Secondary | ICD-10-CM | POA: Insufficient documentation

## 2018-02-14 DIAGNOSIS — Z79899 Other long term (current) drug therapy: Secondary | ICD-10-CM | POA: Insufficient documentation

## 2018-02-14 MED ORDER — CYCLOBENZAPRINE HCL 10 MG PO TABS
5.0000 mg | ORAL_TABLET | Freq: Once | ORAL | Status: AC
  Administered 2018-02-14: 5 mg via ORAL
  Filled 2018-02-14: qty 1

## 2018-02-14 NOTE — Progress Notes (Signed)
CSW aware of consult. CSW spoke with EDP who feels patient would benefit from PT in the home. CSW consulted with RNCM about getting patient set up with [facility]. RNCM has agreed to assist with seeing what patient qualifies for. No further CSW needs at this time. Please reconsult if needs arise.   Ollen Barges, [place] Work Department  [employer]  [phone]

## 2018-02-14 NOTE — ED Triage Notes (Signed)
Reports around 11am today his right leg "locked up on him". Reports this is on going but got worse today.

## 2018-02-14 NOTE — ED Provider Notes (Signed)
[place] DEPT Provider Note   CSN: [id number] Arrival date & time: 02/14/18  1221     History   Chief Complaint Chief Complaint  Patient presents with  . Leg Pain    HPI Theodore King is a 72 y.o. male.  The history is provided by the patient.  Leg Pain   This is a chronic problem. The current episode started 1 to 2 hours ago. The problem occurs every several days. The problem has been gradually improving. The pain is present in the right upper leg. The quality of the pain is described as intermittent (spasm). The pain is at a severity of 0/10. The patient is experiencing no pain. Associated symptoms include stiffness. Pertinent negatives include no numbness, no tingling and no itching. The symptoms are aggravated by activity. He has tried nothing for the symptoms. The treatment provided no relief. There has been no history of extremity trauma (History of parkinsons disease).    Past Medical History:  Diagnosis Date  . Disorders of bursae and tendons in shoulder region, unspecified   . Other and unspecified hyperlipidemia   . Other and unspecified hyperlipidemia   . 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 Parkinson's disease ([place])   . Unspecified vitamin D deficiency   . Unspecified vitamin D deficiency     Patient Active Problem List   Diagnosis Date Noted  . Vertigo, benign paroxysmal 11/05/2016  . Abnormal dreams 09/06/2013  . 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 Hyperglycemia 05/18/2013  . Paralysis agitans ([place]) 05/18/2013  . Vitamin D deficiency 05/18/2013  . Hyperlipidemia LDL goal < 100 05/18/2013  . Parkinson's disease ([place]) 10/26/2012    Past Surgical History:  Procedure Laterality Date  . APPENDECTOMY    . ROTATOR CUFF REPAIR     right        Home Medications    Prior to Admission medications   Medication Sig Start Date End Date Taking? Authorizing Provider  albuterol (PROVENTIL HFA;VENTOLIN  HFA) 108 (90 BASE) MCG/ACT inhaler Inhale 2 puffs into the lungs every 6 (six) hours as needed for wheezing or shortness of breath. 07/04/13  Yes Lauree Chandler, NP  carbidopa-levodopa (SINEMET CR) 50-200 MG tablet Take 1 tablet by mouth at bedtime. 01/16/18  Yes Tat, Eustace Quail, DO  carbidopa-levodopa (SINEMET IR) 25-100 MG tablet TAKE 2 TABLETS BY MOUTH THREE TIMES DAILY AT  7AM/11AM/4PM Patient taking differently: Take 2 tablets by mouth 3 (three) times daily.  02/07/18  Yes Tat, Eustace Quail, DO  Cholecalciferol (VITAMIN D PO) Take 1 tablet by mouth daily.   Yes [provider]  clonazePAM (KLONOPIN) 0.5 MG tablet Take 0.5 tablets (0.25 mg total) by mouth at bedtime. 01/16/18  Yes Tat, Eustace Quail, DO  Menthol (BIOFREEZE) 10.5 % AERO Apply 1 spray topically. 2 to 3 times daily as needed for pain   Yes [provider]  metFORMIN (GLUCOPHAGE) 500 MG tablet Take 500 mg by mouth daily with breakfast.    Yes [provider]  OVER THE COUNTER MEDICATION Apply 1 application topically 2 (two) times daily as needed (leg pain). Anti-pain cream   Yes [provider]  Pimavanserin Tartrate (NUPLAZID) 34 MG CAPS Take 34 mg by mouth daily. 01/30/18  Yes Tat, Eustace Quail, DO  AMBULATORY NON FORMULARY MEDICATION Lift Chair 01/16/18   Tat, Eustace Quail, DO    Family History Family History  Problem Relation Age of Onset  . Cancer Mother   .  Cancer Father   . Stroke Brother   . Cancer Sister   . Cancer Sister   . Colon cancer Neg Hx   . Esophageal cancer Neg Hx   . Rectal cancer Neg Hx   . Stomach cancer Neg Hx     Social History Social History   Tobacco Use  . Smoking status: Former Research scientist (life sciences)  . Smokeless tobacco: Never Used  Substance Use Topics  . Alcohol use: Yes    Comment: occasionally ("a sip every now and then")  . Drug use: No     Allergies   Patient has no known allergies.   Review of Systems Review of Systems  Constitutional: Negative for chills and fever.   HENT: Negative for ear pain and sore throat.   Eyes: Negative for pain and visual disturbance.  Respiratory: Negative for cough and shortness of breath.   Cardiovascular: Negative for chest pain and palpitations.  Gastrointestinal: Negative for abdominal pain and vomiting.  Genitourinary: Negative for dysuria and hematuria.  Musculoskeletal: Positive for gait problem and stiffness. Negative for arthralgias and back pain.  Skin: Negative for color change, itching and rash.  Neurological: Negative for tingling, seizures, syncope and numbness.  All other systems reviewed and are negative.    Physical Exam Updated Vital Signs  ED Triage Vitals  Enc Vitals Group     BP 02/14/18 1229 (!) 147/78     Pulse Rate 02/14/18 1229 83     Resp 02/14/18 1229 18     Temp 02/14/18 1229 98.5 F (36.9 C)     Temp Source 02/14/18 1229 Oral     SpO2 02/14/18 1229 98 %     Weight 02/14/18 1229 230 lb (104.3 kg)     Height 02/14/18 1229 6\' 1"  (1.854 m)     Head Circumference --      Peak Flow --      Pain Score 02/14/18 1228 8     Pain Loc --      Pain Edu? --      Excl. in [place]? --     Physical Exam  Constitutional: He is oriented to person, place, and time. He appears well-developed and well-nourished.  HENT:  Head: Normocephalic and atraumatic.  Eyes: Pupils are equal, round, and reactive to light. Conjunctivae and EOM are normal.  Neck: Normal range of motion. Neck supple.  Cardiovascular: Normal rate and regular rhythm.  No murmur heard. Pulmonary/Chest: Effort normal and breath sounds normal. No respiratory distress.  Abdominal: Soft. There is no tenderness.  Musculoskeletal: Normal range of motion. He exhibits no edema, tenderness or deformity.  Neurological: He is alert and oriented to person, place, and time. No cranial nerve deficit.  5+ out of 5 strength throughout, normal sensation, antalgic gait/short shuffling gait with a cane  Skin: Skin is warm and dry.  Psychiatric: He has a  normal mood and affect.  Nursing note and vitals reviewed.    ED Treatments / Results  Labs (all labs ordered are listed, but only abnormal results are displayed) Labs Reviewed - No data to display  EKG None  Radiology No results found.  Procedures Procedures (including critical care time)  Medications Ordered in ED Medications  cyclobenzaprine (FLEXERIL) tablet 5 mg (5 mg Oral Given 02/14/18 1315)     Initial Impression / Assessment and Plan / ED Course  I have reviewed the triage vital signs and the nursing notes.  Pertinent labs & imaging results that were available during my care of  the patient were reviewed by me and considered in my medical decision making (see chart for details).     EINER MEALS is a 72 year old male with history of Parkinson's disease who presents to the ED with right thigh issue.  Patient with normal vitals.  No fever.  Patient states that over the last several days that his right leg, hip, thigh locks up on him when he walks.  Patient has a history of the same.  Has a history of Parkinson's.  Uses a cane to ambulate.  Overall has difficulty with ambulation.  It appears that he has required more assistance recently and has been working towards getting long-term placement in a facility that can allow for more supervised care and physical therapy.  Patient is here with his girlfriend who helps provide for him at home.  He denies any current pain in the right thigh or hip.  No new falls.  Neurologically patient appears intact.  Neurovascularly appears intact.  No concern for stroke, trauma.  Suspect that this is an ongoing chronic problem.  Suspect possible muscle spasm.  Patient given Flexeril in the ED.  Social work was engaged to help provide home health until patient could get long-term placement.  Social work came down to the ED and discuss home health, physical therapy, occupational therapy needs for the patient.  Patient does have a walker at home  and advised that he use his walker when he tries to ambulate at home.  Discharged from the ED in good condition and told to return to ED if symptoms worsen.  Final Clinical Impressions(s) / ED Diagnoses   Final diagnoses:  Right leg pain    ED Discharge Orders    None       Lennice Sites, DO 02/14/18 1423

## 2018-02-14 NOTE — Care Management Note (Signed)
Case Management Note  Patient Details  Name: Theodore King MRN: [id number] Date of Birth: 1946/06/01  CM consulted for [facility] and DME.  CM spoke at length with pt and girlfriend, Theodore King, about pt's needs.  Discussed the packet of information sent by Shelby Mattocks at Dr. Doristine Devoid office.  Theodore King was unaware of this information.  The pt reports he knows where it is.  Discussed Medicaid, Medicaid PCS, [employer] authorization process for SNF rehab, paying privately for LTC SNF, and DME.  Reinforced the most important next step is to apply for Medicaid.  Pt wants to consider a hospital bed and shower chair but did not want to make a decision toady.  Pt chose [facility] for services.  Pt and girlfriend had no further questions.  CM contacted Georgina Snell with [facility] who accepted pt for services and is aware of pt's needs.  Updated Dr. Ronnald Nian and will send a message to CSW at Dr. Doristine Devoid office.  No further CM needs noted at this time.  Expected Discharge Date:   02/14/2018               Expected Discharge Plan:  [place]  Discharge planning Services  CM Consult  Post Acute Care Choice:  Home Health Choice offered to:  Patient, Spouse  HH Arranged:  RN, PT, OT, Nurse's Aide, Speech Therapy, Social Work [employer] Agency:  [place]  Status of Service:  Completed, signed off   Apolo Cutshaw, Benjaman Lobe, RN 02/14/2018, 2:05 PM

## 2018-02-16 ENCOUNTER — Ambulatory Visit: Payer: Self-pay | Admitting: Neurology

## 2018-02-16 ENCOUNTER — Encounter

## 2018-02-23 ENCOUNTER — Other Ambulatory Visit: Payer: Self-pay | Admitting: Neurology

## 2018-02-23 MED ORDER — PIMAVANSERIN TARTRATE 34 MG PO CAPS: 34.0000 mg | ORAL_CAPSULE | Freq: Every day | ORAL | 1 refills | Status: DC

## 2018-03-11 ENCOUNTER — Emergency Department (HOSPITAL_COMMUNITY)
Admission: EM | Admit: 2018-03-11 | Discharge: 2018-03-12 | Disposition: A | Discharge status: Home / Self Care | Payer: Medicare HMO | Attending: Emergency Medicine | Admitting: Emergency Medicine

## 2018-03-11 ENCOUNTER — Other Ambulatory Visit: Payer: Self-pay

## 2018-03-11 ENCOUNTER — Other Ambulatory Visit: Payer: Self-pay | Admitting: Neurology

## 2018-03-11 ENCOUNTER — Encounter (HOSPITAL_COMMUNITY): Payer: Self-pay | Admitting: *Deleted

## 2018-03-11 ENCOUNTER — Emergency Department (HOSPITAL_COMMUNITY): Payer: Medicare HMO

## 2018-03-11 DIAGNOSIS — R5381 Other malaise: Secondary | ICD-10-CM | POA: Insufficient documentation

## 2018-03-11 DIAGNOSIS — M79604 Pain in right leg: Secondary | ICD-10-CM | POA: Diagnosis not present

## 2018-03-11 DIAGNOSIS — Z7984 Long term (current) use of oral hypoglycemic drugs: Secondary | ICD-10-CM | POA: Insufficient documentation

## 2018-03-11 DIAGNOSIS — Z79899 Other long term (current) drug therapy: Secondary | ICD-10-CM | POA: Insufficient documentation

## 2018-03-11 DIAGNOSIS — Z87891 Personal history of nicotine dependence: Secondary | ICD-10-CM | POA: Insufficient documentation

## 2018-03-11 DIAGNOSIS — R0602 Shortness of breath: Secondary | ICD-10-CM | POA: Diagnosis present

## 2018-03-11 DIAGNOSIS — G20 Parkinson's disease: Secondary | ICD-10-CM | POA: Diagnosis not present

## 2018-03-11 DIAGNOSIS — R531 Weakness: Secondary | ICD-10-CM | POA: Diagnosis not present

## 2018-03-11 LAB — D-DIMER, QUANTITATIVE: D-Dimer, Quant: 0.29 ug/mL-FEU (ref 0.00–0.50)

## 2018-03-11 LAB — CBC
HCT: 44 % (ref 39.0–52.0)
Hemoglobin: 14 g/dL (ref 13.0–17.0)
MCH: 27.5 pg (ref 26.0–34.0)
MCHC: 31.8 g/dL (ref 30.0–36.0)
MCV: 86.4 fL (ref 78.0–100.0)
Platelets: 160 10*3/uL (ref 150–400)
RBC: 5.09 MIL/uL (ref 4.22–5.81)
RDW: 12.8 % (ref 11.5–15.5)
WBC: 5.3 10*3/uL (ref 4.0–10.5)

## 2018-03-11 LAB — BASIC METABOLIC PANEL
Anion gap: 7 (ref 5–15)
BUN: 15 mg/dL (ref 8–23)
CO2: 24 mmol/L (ref 22–32)
Calcium: 9.2 mg/dL (ref 8.9–10.3)
Chloride: 109 mmol/L (ref 98–111)
Creatinine, Ser: 1.12 mg/dL (ref 0.61–1.24)
GFR calc Af Amer: >60 mL/min (ref >60)
GFR calc non Af Amer: >60 mL/min (ref >60)
Glucose, Bld: 125 mg/dL — ABNORMAL HIGH (ref 70–99)
Potassium: 4.1 mmol/L (ref 3.5–5.1)
Sodium: 140 mmol/L (ref 135–145)

## 2018-03-11 LAB — I-STAT TROPONIN, ED: Troponin i, poc: 0 ng/mL (ref 0.00–0.08)

## 2018-03-11 LAB — BRAIN NATRIURETIC PEPTIDE: B Natriuretic Peptide: 15.4 pg/mL (ref 0.0–100.0)

## 2018-03-11 NOTE — ED Notes (Signed)
Assisted patient to bathroom with wheelchair; pt c/o generalized weakness (usually uses a cane when ambulating).

## 2018-03-11 NOTE — ED Triage Notes (Signed)
Pt has been having sob on exertion for the past 2 weeks. Reports pain to R knee radiating up. Pt denies cp, only sob after walking. Pt also reports having 3 injections in his back over the past week for pain. Also reports tremors to L arm.

## 2018-03-11 NOTE — ED Provider Notes (Addendum)
[place] EMERGENCY DEPARTMENT Provider Note   CSN: [id number] Arrival date & time: 03/11/18  1930     History   Chief Complaint Chief Complaint  Patient presents with  . Shortness of Breath  . Leg Pain    HPI Theodore King is a 72 y.o. male with a PMH of Parkinson's disease who presents with shortness of breath on exertion and right leg pain.  His symptoms started around 2 weeks ago and at the same time.  He says he has trouble getting his "feet under him" when going from sitting to standing, and he has a muscle spasm going from his right hip to right knee.  He also is experiencing pins-and-needles type pain in his right thigh.  Patient is experiencing shortness of breath on exertion.  He said that he was trying to climb a hill today, and it took him much longer than usual because he had to walk so slowly due to his shortness of breath.  He denies chest pain and back pain.  He denies leg swelling and orthopnea.  He also denies cough and fever.    Past Medical History:  Diagnosis Date  . Disorders of bursae and tendons in shoulder region, unspecified   . Other and unspecified hyperlipidemia   . Other and unspecified hyperlipidemia   . 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 Parkinson's disease ([place])   . Unspecified vitamin D deficiency   . Unspecified vitamin D deficiency     Patient Active Problem List   Diagnosis Date Noted  . Vertigo, benign paroxysmal 11/05/2016  . Abnormal dreams 09/06/2013  . 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 Hyperglycemia 05/18/2013  . Paralysis agitans ([place]) 05/18/2013  . Vitamin D deficiency 05/18/2013  . Hyperlipidemia LDL goal < 100 05/18/2013  . Parkinson's disease ([place]) 10/26/2012    Past Surgical History:  Procedure Laterality Date  . APPENDECTOMY    . ROTATOR CUFF REPAIR     right        Home Medications    Prior to Admission medications   Medication Sig Start Date End Date Taking?  Authorizing Provider  albuterol (PROVENTIL HFA;VENTOLIN HFA) 108 (90 BASE) MCG/ACT inhaler Inhale 2 puffs into the lungs every 6 (six) hours as needed for wheezing or shortness of breath. 07/04/13  Yes Lauree Chandler, NP  Aspirin-Acetaminophen-Caffeine (GOODY HEADACHE PO) Take 1 packet by mouth as needed (for headaches or pain).   Yes [provider]  carbidopa-levodopa (SINEMET CR) 50-200 MG tablet Take 1 tablet by mouth at bedtime. 01/16/18  Yes Tat, Eustace Quail, DO  carbidopa-levodopa (SINEMET IR) 25-100 MG tablet TAKE 2 TABLETS BY MOUTH THREE TIMES DAILY AT  7AM/11AM/4PM Patient taking differently: Take 2 tablets by mouth See admin instructions. Take 2 tablets by mouth three times a day- 7 AM, 11 AM, and 4 PM 02/07/18  Yes Tat, Eustace Quail, DO  Cholecalciferol (VITAMIN D-3) 1000 units CAPS Take 1,000 Units by mouth 3 (three) times a week.   Yes [provider]  clonazePAM (KLONOPIN) 0.5 MG tablet Take 0.5 tablets (0.25 mg total) by mouth at bedtime. 01/16/18  Yes Tat, Eustace Quail, DO  metFORMIN (GLUCOPHAGE) 500 MG tablet Take 500 mg by mouth daily with breakfast.    Yes [provider]  Pimavanserin Tartrate (NUPLAZID) 34 MG CAPS Take 34 mg by mouth daily. 02/23/18  Yes Tat, Eustace Quail, DO  AMBULATORY NON FORMULARY MEDICATION Lift Chair 01/16/18   Tat, Eustace Quail, DO  Family History Family History  Problem Relation Age of Onset  . Cancer Mother   . Cancer Father   . Stroke Brother   . Cancer Sister   . Cancer Sister   . Colon cancer Neg Hx   . Esophageal cancer Neg Hx   . Rectal cancer Neg Hx   . Stomach cancer Neg Hx     Social History Social History   Tobacco Use  . Smoking status: Former Research scientist (life sciences)  . Smokeless tobacco: Never Used  Substance Use Topics  . Alcohol use: Yes    Comment: occasionally ("a sip every now and then")  . Drug use: No     Allergies   Patient has no known allergies.   Review of Systems Review of Systems  Constitutional: Negative  for activity change, appetite change and fever.  HENT: Negative for congestion.   Respiratory: Positive for shortness of breath. Negative for cough and chest tightness.   Cardiovascular: Negative for chest pain and leg swelling.  Gastrointestinal: Negative for abdominal pain.  Musculoskeletal: Positive for back pain.  Neurological: Positive for tremors (d/t parkinsons). Negative for dizziness.     Physical Exam Updated Vital Signs BP (!) 169/84   Pulse 67   Temp 98.8 F (37.1 C) (Oral)   Resp 18   SpO2 99%   Physical Exam  Constitutional: He is oriented to person, place, and time. He appears well-developed and well-nourished. No distress.  HENT:  Head: Normocephalic and atraumatic.  Mouth/Throat: Oropharynx is clear and moist.  Eyes: EOM are normal.  Neck: Normal range of motion.  Cardiovascular: Normal rate, regular rhythm and normal heart sounds. Exam reveals no decreased pulses.  Pulmonary/Chest: Effort normal and breath sounds normal. No respiratory distress. He exhibits no tenderness.  Abdominal: Soft. Bowel sounds are normal. He exhibits no distension. There is no tenderness.  Musculoskeletal: Normal range of motion.       Right lower leg: Normal. He exhibits no tenderness and no edema.       Left lower leg: Normal. He exhibits no tenderness and no edema.  Neurological: He is alert and oriented to person, place, and time.  Skin: Skin is warm and dry. No rash noted.  Psychiatric: He has a normal mood and affect. His behavior is normal.     ED Treatments / Results  Labs (all labs ordered are listed, but only abnormal results are displayed) Labs Reviewed  BASIC METABOLIC PANEL - Abnormal; Notable for the following components:      Result Value   Glucose, Bld 125 (*)    All other components within normal limits  CBC  D-DIMER, QUANTITATIVE (NOT AT [facility])  BRAIN NATRIURETIC PEPTIDE  I-STAT TROPONIN, ED    EKG EKG Interpretation  Date/Time:  Saturday March 11 2018 19:33:43 EDT Ventricular Rate:  79 PR Interval:  210 QRS Duration: 92 QT Interval:  370 QTC Calculation: 424 R Axis:   40 Text Interpretation:  Sinus rhythm with 1st degree A-V block Otherwise normal ECG Confirmed by Elnora Morrison [phone]) on 03/11/2018 10:35:43 PM   Radiology Dg Chest 2 View  Result Date: 03/11/2018 CLINICAL DATA:  Worsening shortness of breath EXAM: CHEST - 2 VIEW COMPARISON:  06/25/2017 FINDINGS: Stable asymmetric elevation left hemidiaphragm. Stable atelectasis left lung base. The lungs are clear without focal pneumonia, edema, pneumothorax or pleural effusion. Cardiopericardial silhouette is at upper limits of normal for size. The visualized bony structures of the thorax are intact. IMPRESSION: Stable.  No acute cardiopulmonary findings. Electronically  Signed   By: Misty Stanley M.D.   On: 03/11/2018 20:09    Procedures Procedures (including critical care time)  Medications Ordered in ED Medications - No data to display   Initial Impression / Assessment and Plan / ED Course  I have reviewed the triage vital signs and the nursing notes.  Pertinent labs & imaging results that were available during my care of the patient were reviewed by me and considered in my medical decision making (see chart for details).     Patient's right leg seems chronic according to the patient's chart.  He has been could be a combination of muscle spasm and neuropathic pain.  Unsteadiness is likely a result of patient's Parkinson's disease.  Shortness of breath is likely due to deconditioning rather than pneumonia or pulmonary embolism.  No signs of volume overload.  CBC and BMP are within normal limits.  Chest x-ray appears reassuring, as does his pulmonary exam.  Will obtain a d-dimer to rule out PE.  D-dimer and BNP are within normal limits.  Patient's troponin is normal, he can be discharged with close follow-up with his primary doctor.  I will order home health PT and nursing  to follow-up with the patient at home due to his physical impairments from Parkinson's disease.   Final Clinical Impressions(s) / ED Diagnoses   Final diagnoses:  Generalized weakness  Right leg pain  Physical deconditioning    ED Discharge Orders    None       Kathrene Alu, MD 03/11/18 2307    Kathrene Alu, MD 03/11/18 2322    Elnora Morrison, MD 03/12/18 0009

## 2018-03-11 NOTE — Discharge Instructions (Signed)
Please see your primary doctor early next week to check up on your shortness of breath and leg pain.  We have ordered

## 2018-03-13 NOTE — Telephone Encounter (Signed)
Verified directions and amounts. Please sign due to controlled substance.

## 2018-03-15 ENCOUNTER — Telehealth: Payer: Self-pay | Admitting: Neurology

## 2018-03-15 NOTE — Telephone Encounter (Signed)
Patient's daughter called and is needing to know if he should see Dr. Carles Collet or his PCP regarding him having trouble breathing and his knee. Please Advise. Thanks

## 2018-03-15 NOTE — Telephone Encounter (Signed)
Spoke with patient's daughter. Made her aware SOB would be primary care. It does look like he went to the ER for these complaints on 03/11/18 and they thought it was deconditioning.  I let her know he has been in the office several times confused about his medications, and if he is not taking his medications correctly, he could be having pain in legs due to that. She asked me to send an updated med list to her and this was mailed today.

## 2018-03-23 DIAGNOSIS — R0602 Shortness of breath: Secondary | ICD-10-CM | POA: Insufficient documentation

## 2018-03-23 HISTORY — DX: Shortness of breath: R06.02

## 2018-03-23 NOTE — Progress Notes (Deleted)
Cardiology Office Note:    Date:  03/23/2018   ID:  Theodore King, DOB 1946/02/01, MRN [id number]  PCP:  [name], MD  Cardiologist:  Shirlee More, MD   Referring MD: [name], MD  ASSESSMENT:    No diagnosis found. PLAN:    In order of problems listed above:  1. ***  Next appointment   Medication Adjustments/Labs and Tests Ordered: Current medicines are reviewed at length with the patient today.  Concerns regarding medicines are outlined above.  No orders of the defined types were placed in this encounter.  No orders of the defined types were placed in this encounter.    No chief complaint on file. ***  History of Present Illness:    Theodore King is a 72 y.o. male who is being seen today for the evaluation of SOB after ED visit 03/11/18 at the request of [name], MD. His evaluation showed the presence of a normal CBC BMP proBNP d-dimer and troponin.  His chest x-ray is normal and EKG and his EKG was normal.  Past Medical History:  Diagnosis Date  . Disorders of bursae and tendons in shoulder region, unspecified   . Other and unspecified hyperlipidemia   . Other and unspecified hyperlipidemia   . 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 Parkinson's disease ([place])   . Unspecified vitamin D deficiency   . Unspecified vitamin D deficiency     Past Surgical History:  Procedure Laterality Date  . APPENDECTOMY    . ROTATOR CUFF REPAIR     right    Current Medications: No outpatient medications have been marked as taking for the 03/24/18 encounter (Appointment) with Richardo Priest, MD.     Allergies:   Patient has no known allergies.   Social History   Socioeconomic History  . Marital status: Legally Separated    Spouse name: Not on file  . Number of children: 3  . Years of education: 12th  . Highest education level: Not on file  Occupational History    Employer: RETIRED  Social Needs  . Financial resource  strain: Not on file  . Food insecurity:    Worry: Not on file    Inability: Not on file  . Transportation needs:    Medical: Not on file    Non-medical: Not on file  Tobacco Use  . Smoking status: Former Research scientist (life sciences)  . Smokeless tobacco: Never Used  Substance and Sexual Activity  . Alcohol use: Yes    Comment: occasionally ("a sip every now and then")  . Drug use: No  . Sexual activity: Never  Lifestyle  . Physical activity:    Days per week: Not on file    Minutes per session: Not on file  . Stress: Not on file  Relationships  . Social connections:    Talks on phone: Not on file    Gets together: Not on file    Attends religious service: Not on file    Active member of club or organization: Not on file    Attends meetings of clubs or organizations: Not on file    Relationship status: Not on file  Other Topics Concern  . Not on file  Social History Narrative  . Not on file     Family History: The patient's ***family history includes Cancer in his father, mother, sister, and sister; Stroke in his brother. There is no history of Colon cancer, Esophageal cancer, Rectal cancer, or Stomach cancer.  ROS:  ROS Please see the history of present illness.    *** All other systems reviewed and are negative.  EKGs/Labs/Other Studies Reviewed:    The following studies were reviewed today: ***  EKG:  EKG is *** ordered today.  The ekg ordered today demonstrates ***  Recent Labs: 12/12/2017: ALT 6 03/11/2018: B Natriuretic Peptide 15.4; BUN 15; Creatinine, Ser 1.12; Hemoglobin 14.0; Platelets 160; Potassium 4.1; Sodium 140  Recent Lipid Panel    Component Value Date/Time   CHOL 176 09/12/2013 0952   TRIG 84 09/12/2013 0952   HDL 59 09/12/2013 0952   CHOLHDL 3.0 09/12/2013 0952   LDLCALC 100 (H) 09/12/2013 7903    Physical Exam:    VS:  There were no vitals taken for this visit.    Wt Readings from Last 3 Encounters:  02/14/18 230 lb (104.3 kg)  01/16/18 220 lb (99.8 kg)    12/12/17 238 lb 8 oz (108.2 kg)     GEN: *** Well nourished, well developed in no acute distress HEENT: Normal NECK: No JVD; No carotid bruits LYMPHATICS: No lymphadenopathy CARDIAC: ***RRR, no murmurs, rubs, gallops RESPIRATORY:  Clear to auscultation without rales, wheezing or rhonchi  ABDOMEN: Soft, non-tender, non-distended MUSCULOSKELETAL:  No edema; No deformity  SKIN: Warm and dry NEUROLOGIC:  Alert and oriented x 3 PSYCHIATRIC:  Normal affect     Signed, Shirlee More, MD  03/23/2018 1:24 PM    [facility]

## 2018-03-24 ENCOUNTER — Ambulatory Visit: Payer: Self-pay | Admitting: Cardiology

## 2018-03-27 ENCOUNTER — Emergency Department (HOSPITAL_COMMUNITY): Payer: Medicare HMO

## 2018-03-27 ENCOUNTER — Encounter (HOSPITAL_COMMUNITY): Payer: Self-pay | Admitting: Emergency Medicine

## 2018-03-27 ENCOUNTER — Emergency Department (HOSPITAL_COMMUNITY)
Admission: EM | Admit: 2018-03-27 | Discharge: 2018-03-28 | Disposition: A | Discharge status: Home / Self Care | Payer: Medicare HMO | Attending: Emergency Medicine | Admitting: Emergency Medicine

## 2018-03-27 DIAGNOSIS — Z79899 Other long term (current) drug therapy: Secondary | ICD-10-CM | POA: Insufficient documentation

## 2018-03-27 DIAGNOSIS — Z87891 Personal history of nicotine dependence: Secondary | ICD-10-CM | POA: Insufficient documentation

## 2018-03-27 DIAGNOSIS — R079 Chest pain, unspecified: Secondary | ICD-10-CM | POA: Diagnosis present

## 2018-03-27 DIAGNOSIS — R0789 Other chest pain: Secondary | ICD-10-CM

## 2018-03-27 DIAGNOSIS — Z7984 Long term (current) use of oral hypoglycemic drugs: Secondary | ICD-10-CM | POA: Insufficient documentation

## 2018-03-27 DIAGNOSIS — G20 Parkinson's disease: Secondary | ICD-10-CM | POA: Insufficient documentation

## 2018-03-27 LAB — BASIC METABOLIC PANEL
Anion gap: 9 (ref 5–15)
BUN: 13 mg/dL (ref 8–23)
CO2: 26 mmol/L (ref 22–32)
Calcium: 9.2 mg/dL (ref 8.9–10.3)
Chloride: 104 mmol/L (ref 98–111)
Creatinine, Ser: 1.03 mg/dL (ref 0.61–1.24)
GFR calc Af Amer: >60 mL/min (ref >60)
GFR calc non Af Amer: >60 mL/min (ref >60)
Glucose, Bld: 92 mg/dL (ref 70–99)
Potassium: 3.7 mmol/L (ref 3.5–5.1)
Sodium: 139 mmol/L (ref 135–145)

## 2018-03-27 LAB — CBC
HCT: 44 % (ref 39.0–52.0)
Hemoglobin: 14 g/dL (ref 13.0–17.0)
MCH: 27.2 pg (ref 26.0–34.0)
MCHC: 31.8 g/dL (ref 30.0–36.0)
MCV: 85.6 fL (ref 80.0–100.0)
Platelets: 165 10*3/uL (ref 150–400)
RBC: 5.14 MIL/uL (ref 4.22–5.81)
RDW: 13.2 % (ref 11.5–15.5)
WBC: 7 10*3/uL (ref 4.0–10.5)
nRBC: 0 % (ref 0.0–0.2)

## 2018-03-27 LAB — I-STAT TROPONIN, ED: Troponin i, poc: 0.01 ng/mL (ref 0.00–0.08)

## 2018-03-27 NOTE — ED Triage Notes (Signed)
Pt presents with sob and cp since 30 mins PTA; pt states he needs a breathing treatment; lungs clear bilat, spo2 99%; pt able to speak in complete sentences; pt states CP is central and non radiating;

## 2018-03-28 ENCOUNTER — Encounter: Payer: Self-pay | Admitting: Cardiology

## 2018-03-28 LAB — HEPATIC FUNCTION PANEL
ALT: <5 U/L (ref 0–44)
AST: 16 U/L (ref 15–41)
Albumin: 3.8 g/dL (ref 3.5–5.0)
Alkaline Phosphatase: 61 U/L (ref 38–126)
Bilirubin, Direct: 0.2 mg/dL (ref 0.0–0.2)
Indirect Bilirubin: 1.1 mg/dL — ABNORMAL HIGH (ref 0.3–0.9)
Total Bilirubin: 1.3 mg/dL — ABNORMAL HIGH (ref 0.3–1.2)
Total Protein: 6.9 g/dL (ref 6.5–8.1)

## 2018-03-28 LAB — LIPASE, BLOOD: Lipase: 40 U/L (ref 11–51)

## 2018-03-28 LAB — I-STAT TROPONIN, ED: Troponin i, poc: 0 ng/mL (ref 0.00–0.08)

## 2018-03-28 MED ORDER — PANTOPRAZOLE SODIUM 40 MG PO TBEC
40.0000 mg | DELAYED_RELEASE_TABLET | Freq: Once | ORAL | Status: AC
  Administered 2018-03-28: 40 mg via ORAL
  Filled 2018-03-28: qty 1

## 2018-03-28 MED ORDER — PANTOPRAZOLE SODIUM 40 MG PO TBEC: 40.0000 mg | DELAYED_RELEASE_TABLET | Freq: Every day | ORAL | 0 refills | Status: DC

## 2018-03-28 NOTE — ED Provider Notes (Signed)
TIME SEEN: 5:22 AM  CHIEF COMPLAINT: Chest pain  HPI: Patient is a 72 year old male with history of hyperlipidemia, Parkinson's who presents to the emergency department with burning central chest pain without radiation that started at 7:30 PM last night when he got ready to eat.  States he did have diaphoresis but no shortness of breath despite nursing notes, nausea, vomiting, dizziness.  States symptoms got better after eating meat loaf and now he only has "a tiny bit" of pain.  No bloody stools or melena.  No abdominal pain.  Pain is not exertional or pleuritic.  ROS: See HPI Constitutional: no fever  Eyes: no drainage  ENT: no runny nose   Cardiovascular:   chest pain  Resp: no SOB  GI: no vomiting GU: no dysuria Integumentary: no rash  Allergy: no hives  Musculoskeletal: no leg swelling  Neurological: no slurred speech ROS otherwise negative  PAST MEDICAL HISTORY/PAST SURGICAL HISTORY:  Past Medical History:  Diagnosis Date  . Abnormal dreams 09/06/2013  . Disorders of bursae and tendons in shoulder region, unspecified   . Hyperglycemia 05/18/2013  . Hyperlipidemia LDL goal < 100 05/18/2013  . Other and unspecified hyperlipidemia   . Other and unspecified hyperlipidemia   . 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 Paralysis agitans ([place]) 05/18/2013  . Parkinson's disease ([place])   . 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 SOB (shortness of breath) 03/23/2018  . Unspecified vitamin D deficiency   . Unspecified vitamin D deficiency   . Vertigo, benign paroxysmal 11/05/2016  . Vitamin D deficiency 05/18/2013    MEDICATIONS:  Prior to Admission medications   Medication Sig Start Date End Date Taking? Authorizing Provider  albuterol (PROVENTIL HFA;VENTOLIN HFA) 108 (90 BASE) MCG/ACT inhaler Inhale 2 puffs into the lungs every 6 (six) hours as needed for wheezing or shortness of breath. 07/04/13   Lauree Chandler, NP  AMBULATORY NON FORMULARY MEDICATION Lift Chair  01/16/18   Tat, Eustace Quail, DO  Aspirin-Acetaminophen-Caffeine (GOODY HEADACHE PO) Take 1 packet by mouth as needed (for headaches or pain).    [provider]  carbidopa-levodopa (SINEMET CR) 50-200 MG tablet Take 1 tablet by mouth at bedtime. 01/16/18   Tat, Eustace Quail, DO  carbidopa-levodopa (SINEMET CR) 50-200 MG tablet TAKE 1 TABLET BY MOUTH AT BEDTIME 03/13/18   Cameron Sprang, MD  carbidopa-levodopa (SINEMET IR) 25-100 MG tablet 2 tablets at 7 AM, 2 tablets at 11 AM and 1 tablet at 4 PM; can take extra 1/2 tab QD PRN 03/13/18   Cameron Sprang, MD  Cholecalciferol (VITAMIN D-3) 1000 units CAPS Take 1,000 Units by mouth 3 (three) times a week.    [provider]  clonazePAM (KLONOPIN) 0.5 MG tablet Take 0.5 tablets (0.25 mg total) by mouth at bedtime. 01/16/18   Tat, Eustace Quail, DO  clonazePAM (KLONOPIN) 0.5 MG tablet TAKE 1/2 (ONE-HALF) TABLET BY MOUTH AT BEDTIME 03/13/18   Cameron Sprang, MD  metFORMIN (GLUCOPHAGE) 500 MG tablet Take 500 mg by mouth daily with breakfast.     [provider]  Pimavanserin Tartrate (NUPLAZID) 34 MG CAPS Take 34 mg by mouth daily. 02/23/18   Tat, Eustace Quail, DO    ALLERGIES:  No Known Allergies  SOCIAL HISTORY:  Social History   Tobacco Use  . Smoking status: Former Research scientist (life sciences)  . Smokeless tobacco: Never Used  Substance Use Topics  . Alcohol use: Yes    Comment: occasionally ("a sip every now and then")  FAMILY HISTORY: Family History  Problem Relation Age of Onset  . Cancer Mother   . Cancer Father   . Stroke Brother   . Cancer Sister   . Cancer Sister   . Colon cancer Neg Hx   . Esophageal cancer Neg Hx   . Rectal cancer Neg Hx   . Stomach cancer Neg Hx     EXAM: BP (!) 143/69 (BP Location: Left Arm)   Pulse 64   Temp 98.6 F (37 C) (Oral)   Resp 18   Ht 6' (1.829 m)   Wt 108.9 kg   SpO2 100%   BMI 32.55 kg/m  CONSTITUTIONAL: Alert and oriented and responds appropriately to questions. Well-appearing;  well-nourished HEAD: Normocephalic EYES: Conjunctivae clear, pupils appear equal, EOMI ENT: normal nose; moist mucous membranes NECK: Supple, no meningismus, no nuchal rigidity, no LAD  CARD: RRR; S1 and S2 appreciated; no murmurs, no clicks, no rubs, no gallops RESP: Normal chest excursion without splinting or tachypnea; breath sounds clear and equal bilaterally; no wheezes, no rhonchi, no rales, no hypoxia or respiratory distress, speaking full sentences ABD/GI: Normal bowel sounds; non-distended; soft, non-tender, no rebound, no guarding, no peritoneal signs, no hepatosplenomegaly BACK:  The back appears normal and is non-tender to palpation, there is no CVA tenderness EXT: Normal ROM in all joints; non-tender to palpation; no edema; normal capillary refill; no cyanosis, no calf tenderness or swelling    SKIN: Normal color for age and race; warm; no rash NEURO: Moves all extremities equally PSYCH: The patient's mood and manner are appropriate. Grooming and personal hygiene are appropriate.  MEDICAL DECISION MAKING: Patient here with what seems to be very atypical chest pain.  EKG shows no ischemic abnormality.  Troponin negative.  Chest x-ray clear.  Plan is to give Protonix and reassess.  Will obtain second troponin and if this is negative, anticipate discharge home with outpatient follow-up.  He is comfortable with this plan.  ED PROGRESS: Patient second troponin negative.  LFTs and lipase normal.  Pain now gone after Protonix.  Suspect this is related to GERD.  Doubt ACS, dissection or PE.  Will discharge with prescription of Protonix.  I feel he is safe to be discharged home.  He is comfortable with this plan.  At this time, I do not feel there is any life-threatening condition present. I have reviewed and discussed all results (EKG, imaging, lab, urine as appropriate) and exam findings with patient/family. I have reviewed nursing notes and appropriate previous records.  I feel the patient  is safe to be discharged home without further emergent workup and can continue workup as an outpatient as needed. Discussed usual and customary return precautions. Patient/family verbalize understanding and are comfortable with this plan.  Outpatient follow-up has been provided if needed. All questions have been answered.      EKG Interpretation  Date/Time:  Monday March 27 2018 21:28:19 EDT Ventricular Rate:  68 PR Interval:  210 QRS Duration: 104 QT Interval:  384 QTC Calculation: 408 R Axis:   48 Text Interpretation:  Sinus rhythm with 1st degree A-V block Otherwise normal ECG No significant change since last tracing Confirmed by Ward, Cyril Mourning [phone]) on 03/28/2018 5:03:38 AM         Ward, Delice Bison, DO 03/28/18 [id number]

## 2018-03-28 NOTE — Progress Notes (Signed)
Theodore King was seen today in the movement disorders clinic for neurologic consultation at the request of [name], MD.  The consultation is for the evaluation of PD.  Pt currently under the care of Dr. [name].  The records that were made available to me were reviewed.  Pts girlfriend in the waiting room but he refused to allow her in (I asked few times).  His first symptom was left greater than right upper extremity tremor in 2012.  It started after a tear in the rotator cuff, which he thought was the initial cause.  Around that time, he also developed shuffling gait.  It appears that he started levodopa around that time and was not sure that it was beneficial.  In 2013, he started on pramipexole and did think it was beneficial for tremor.  He did notice, however, that with the course of time when he increased levodopa that it did seem to be helpful.  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  He last saw Dr. [name] in June, 2018.  He has been seen at the emergency room several times since then.  He went to the emergency room on December 2 with complaints of leg spasm and increasing tremors.  He admitted he had not taken his Parkinson's medication the day he was seen in the emergency room (seen around noon).  He had also been given baclofen by his primary care physician, but has not picked that up from the pharmacy.  The biggest issue, according to the emergency room, was noncompliance with medication (also noted in Dr. Gladstone Lighter notes).  Once given medication in the emergency room he felt better.  He was discharged to [facility].  He came back to the emergency room on June 25, 2017 for similar complaints of generalized weakness.  No weakness was found on examination and he was discharged home.  He was given a walker.  Pt reports that he is on carbidopa/levodopa 25/100, 1 three times per day.  He is supposed to be on 2 po tid.  He doesn't know other  meds as daughter takes care of them.  Pramipexole and azilect are listed.    Specific Symptoms:  Tremor: Yes.   Family hx of similar:  No. Voice: "I have a tremor in my voice." Sleep: trouble getting to sleep  Vivid Dreams:  Yes.   - "Its like I see things"  Acting out dreams:  No. Wet Pillows: Yes.   Postural symptoms:  Yes.    Falls?  Yes.  , states that he has had 2 falls.  Last fall was about 2 weeks ago.  He was bending over to put on shoes and he kept going.  Didn't get hurt Bradykinesia symptoms: shuffling gait, slow movements and difficulty getting out of a chair Loss of smell:  No. Loss of taste:  No. Urinary Incontinence:  yes but has urinary urgency and occasionally will wear depends if goes out of town Difficulty Swallowing:  No. Handwriting, micrographia: Yes.   Trouble with ADL's:  No.  Trouble buttoning clothing: No. Depression:  No., but "I get angry at myself" Memory changes:  No. - patient denies memory change.  Daughter (23y/o) lives with the patient.  Has a girlfriend.  Puts med in pillbox and girlfriend prepares it.  Daughter does grocery shopping and pays bills.  Pt does not "but I haven't gotten my license back yet."  He cannot tell me why he lost it but may  be from memory. Hallucinations:  Yes (initially denies and then says "I see people but I know there are not people."  visual distortions: yes - "I think something is crawling on the floor and then I realize nothing is there" N/V:  No. Lightheaded:  No.  Syncope: No. Diplopia:  Yes.   Dyskinesia:  No.  Neuroimaging of the brain has previously been performed.  It is available for my review today.  Had MRI brain in 2013 and it showed rare T2 hyperintensity.    09/29/17 update: Patient was worked in today for a follow-up.  He is seen sooner than expected.  He is accompanied by his girlfriend who supplements the history.  He is also on Azilect.  Nuplazid was started last visit.  There has been much confusion over  this medication and he/his daughter were both advised to call Val[facility] Hoar and we have given the many samples.  I received a call at the end of March complaining about more difficulty walking and episodes of freezing.  Confusion about what he was actually taking and how it was prescribed persisted.  Ultimately, it turns out that the patient was still on pramipexole 1 mg, 2 tablets twice per day.  I decreased that to 1 tablet twice a day for a week and then once per day for a week and then discontinued that medication. He is off of that and has been for 2 weeks.   At the same time, I increased his carbidopa/levodopa 25/1 100 to 2 tablets in the morning, 2 in the afternoon (having trouble remembering that and taking it at 2-3pm instead of 11am-12pm)  and 1 in the evening.  Girlfriend reports that he couldn't even get dressed on his own this AM but now that medication has kicked in he can walk normally.  He has trouble at night and she has to lift his legs in the bed.  Sounds like he has wearing off of medication.  He went into a restaurant bathroom and then could not get out of the restaurant bathroom and she did not know what to do since it was a men's bathroom.  She calls these "spells."  Reports that he is taking baclofen 4 times per day, 20 mg each.  Been on that for "at least 2-3 months."  He is having hallucination once a day or once every 2 days.  He never mentions it to his girlfriend.  He recently had a hallucination when he looked out the window and thought someone was outside taking something apart.  He is very active at night.  Supposed to be on clonazepam, but really has not been taking it.  He is screaming at night and jerking.  Lives with daughter who is at school but stays with girlfriend 3-4 days per week.     He was seen in the emergency room on September 19, 2017.  Those records are reviewed.  He was seen for leg pain and swelling.  Ultrasound was negative for DVT and he was discharged home.    10/13/17  update: The patient is seen today in follow-up.  His girlfriend drove him here but refused to come back to the room when asked by staff members.   He is on carbidopa/levodopa 25/100, 2 tablets at 7 AM/2 tablets at 11 AM and 1 tablet at 4 PM and last visit we added carbidopa/levodopa 50/200 at bedtime.  It is difficult to assess efficacy of this one.   He does state that the alarm  that we sit on his phone last visit has really helped him remember taking his medication.  I asked the patient last visit to check and see whether or not he was on pramipexole, 2 mg twice daily. He brings a med list in today and he is not on that.  He is still on Azilect.  We added very low-dose clonazepam last visit, 0.25 mg, for REM behavior disorder.  "I believe the dreams are gone."  He had one fall since our last visit where he tripped up step.  Didn't get hurt.  He states that hallucinations are gone (baclofen d/c at our last visit but he brings that bottle in today and he is not sure if he is on it).  He still has occasional freezing but not bad per patient.  He asks me about the "new medication" we discussed last visit.   He is going to [facility] 2 days per week.  States that he got in shoulder yesterday and feels better.    01/16/18 update: Patient follows up today for Parkinson's disease and dementia.  Pts accompanied by girlfriend who supplements the hx.  He is supposed to be on carbidopa/levodopa 25/100, 2 tablets at 7 AM, 2 tablets at 11 AM, 1 tablet at 4 PM and carbidopa/levodopa 50/200 at bedtime.  However, girlfriend reports that pharmacy wouldn't give her the IR (pharmacy not open so didn't call) and so she is instead giving him the carbidopa/levodopa 50/200 at 7am/11am/4pm.  This has been so for about a week.  He remains on Azilect as well.  In regards REM behavior disorder, low-dose clonazepam, 0.25 mg, has helped.  He ran out of this about 2 nights ago per girlfriend. Records have been reviewed since our last visit.  There  have been several calls/correspondences with his family.  He has also been in the emergency room in July and in August.  In July, patient was in the emergency room for confusion and slurred speech.  MRI of the brain was obtained on December 12, 2017 and was unremarkable.  Patient was in the emergency room again on January 09, 2017 with generalized weakness.  Family was hoping for nursing placement for rehab.  Social work at the hospital investigated and stated that his insurance would not pay for rehab.  Zanaflex was given for him for muscle spasms. They report he is not taking the zanaflex.   However, when he describes it, he is actually having freezing of the right leg.  Having trouble turning over in the bed at night and getting out of chairs.  No hallucinations.  On nuplazid.    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  He is accompanied by his girlfriend who supplements the history.  He was worked in today.  He and his family walked into the office 2 days ago and stated that he was worse and wanted to be worked in, and this was the appointment we were able to give him.  He is on carbidopa/levodopa 25/100, 2 tablets at 7 AM/2 tablets at 11 AM/1 tablet at 4 PM and carbidopa/levodopa 50/200 at bed time. He describes trouble with first morning on.  Girlfriend describes trouble with him getting in and out of bed.  He has an alarm set on the phone to remind him to take it and that system works well.   I stopped his Azilect and Zanaflex last visit.  He is on clonazepam 0.25 mg at  bedtime.  Records are reviewed since our last visit.  The patient has been in the emergency room several times.  He was there on September 10 with leg pain and was given Flexeril.  Reports he is not taking that.  He was there on October 5, also with leg pain.  Work-up was negative and he was sent home that day.  They called here after that visit and we told them to make  sure that he was taking medications properly because if not, this could cause leg cramping, which was our suspicion.  He was back in the emergency room on October 22 with chest pain, which was felt related to reflux.  Doing well with hallucinations with nuplazid.  PREVIOUS MEDICATIONS: Sinemet  ALLERGIES:  No Known Allergies  CURRENT MEDICATIONS:  Outpatient Encounter Medications as of 03/30/2018  Medication Sig  . albuterol (PROVENTIL HFA;VENTOLIN HFA) 108 (90 BASE) MCG/ACT inhaler Inhale 2 puffs into the lungs every 6 (six) hours as needed for wheezing or shortness of breath.  . Aspirin-Acetaminophen-Caffeine (GOODY HEADACHE PO) Take 1 packet by mouth as needed (for headaches or pain).  . carbidopa-levodopa (SINEMET CR) 50-200 MG tablet Take 1 tablet by mouth at bedtime.  . carbidopa-levodopa (SINEMET CR) 50-200 MG tablet TAKE 1 TABLET BY MOUTH AT BEDTIME  . carbidopa-levodopa (SINEMET IR) 25-100 MG tablet 2 tablets at 7 AM, 2 tablets at 11 AM and 1 tablet at 4 PM; can take extra 1/2 tab QD PRN (Patient taking differently: Take 0.5-2 tablets by mouth See admin instructions. 2 tablets at 7 AM, 2 tablets at 11 AM and 1 tablet at 4 PM; can take extra 1/2 tab QD PRN)  . Cholecalciferol (VITAMIN D-3) 1000 units CAPS Take 1,000 Units by mouth 3 (three) times a week.  . clonazePAM (KLONOPIN) 0.5 MG tablet Take 0.5 tablets (0.25 mg total) by mouth at bedtime.  . metFORMIN (GLUCOPHAGE) 500 MG tablet Take 500 mg by mouth daily with breakfast.   . pantoprazole (PROTONIX) 40 MG tablet Take 1 tablet (40 mg total) by mouth daily.  Debby Freiberg Tartrate (NUPLAZID) 34 MG CAPS Take 34 mg by mouth daily.  . predniSONE (DELTASONE) 10 MG tablet Take 10 mg by mouth 2 (two) times daily.  Marland Kitchen UNABLE TO FIND Flexogenix  . [DISCONTINUED] AMBULATORY NON FORMULARY MEDICATION Lift Chair  . [DISCONTINUED] clonazePAM (KLONOPIN) 0.5 MG tablet TAKE 1/2 (ONE-HALF) TABLET BY MOUTH AT BEDTIME (Patient not taking: Reported on  03/28/2018)   No facility-administered encounter medications on file as of 03/30/2018.     PAST MEDICAL HISTORY:   Past Medical History:  Diagnosis Date  . Abnormal dreams 09/06/2013  . Disorders of bursae and tendons in shoulder region, unspecified   . Hyperglycemia 05/18/2013  . Hyperlipidemia LDL goal < 100 05/18/2013  . Other and unspecified hyperlipidemia   . Other and unspecified hyperlipidemia   . 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 Paralysis agitans ([place]) 05/18/2013  . Parkinson's disease ([place])   . 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 SOB (shortness of breath) 03/23/2018  . Unspecified vitamin D deficiency   . Unspecified vitamin D deficiency   . Vertigo, benign paroxysmal 11/05/2016  . Vitamin D deficiency 05/18/2013    PAST SURGICAL HISTORY:   Past Surgical History:  Procedure Laterality Date  . APPENDECTOMY    . ROTATOR CUFF REPAIR     right    SOCIAL HISTORY:   Social History   Socioeconomic History  . Marital status: Legally Separated  Spouse name: Not on file  . Number of children: 3  . Years of education: 12th  . Highest education level: Not on file  Occupational History    Employer: RETIRED  Social Needs  . Financial resource strain: Not on file  . Food insecurity:    Worry: Not on file    Inability: Not on file  . Transportation needs:    Medical: Not on file    Non-medical: Not on file  Tobacco Use  . Smoking status: Former Research scientist (life sciences)  . Smokeless tobacco: Never Used  Substance and Sexual Activity  . Alcohol use: Yes    Comment: occasionally ("a sip every now and then")  . Drug use: No  . Sexual activity: Never  Lifestyle  . Physical activity:    Days per week: Not on file    Minutes per session: Not on file  . Stress: Not on file  Relationships  . Social connections:    Talks on phone: Not on file    Gets together: Not on file    Attends religious service: Not on file    Active member of club or organization: Not  on file    Attends meetings of clubs or organizations: Not on file    Relationship status: Not on file  . Intimate partner violence:    Fear of current or ex partner: Not on file    Emotionally abused: Not on file    Physically abused: Not on file    Forced sexual activity: Not on file  Other Topics Concern  . Not on file  Social History Narrative  . Not on file    FAMILY HISTORY:   Family Status  Relation Name Status  . Mother  Deceased  . Father  Deceased  . Brother Collins Scotland Deceased  . Sister Kyung Bacca Deceased  . Daughter [employer]  . Son Tessa Lerner  . Sister Hulda Marin Deceased  . Brother [employer]  . Brother [employer]  . Brother [employer]  . Brother [employer]  . Brother [employer]  . Brother [employer]  . Daughter [name] Alive  . Neg Hx  (Not Specified)    ROS: Review of Systems  Constitutional: Negative.   HENT: Negative.   Eyes: Negative.   Respiratory: Negative.   Cardiovascular: Negative.   Genitourinary: Negative.   Musculoskeletal: Negative.   Skin: Negative.     PHYSICAL EXAMINATION:    VITALS:   Vitals:   03/30/18 1347  BP: 96/60  Pulse: 72  SpO2: 96%  Weight: 212 lb (96.2 kg)  Height: 5' 11.2" (1.808 m)     Orientation: The patient is alert and oriented to person and place. Montreal Cognitive Assessment  07/19/2017  Visuospatial/ Executive (0/5) 1  Naming (0/3) 2  Attention: Read list of digits (0/2) 1  Attention: Read list of letters (0/1) 0  Attention: Serial 7 subtraction starting at 100 (0/3) 1  Language: Repeat phrase (0/2) 2  Language : Fluency (0/1) 1  Abstraction (0/2) 0  Delayed Recall (0/5) 0  Orientation (0/6) 4  Total 12  Adjusted Score (based on education) 13   GEN:  The patient appears stated age and is in NAD. HEENT:  Normocephalic, atraumatic.  The mucous membranes are moist. The superficial temporal arteries are without ropiness or tenderness. CV:  RRR Lungs:  CTAB Neck/HEME:  There are no  carotid bruits bilaterally.  Neurological examination:  Orientation: The patient is alert and oriented x3. Cranial nerves:  There is good facial symmetry. The speech is fluent and clear. Soft palate rises symmetrically and there is no tongue deviation. Hearing is intact to conversational tone. Sensation: Sensation is intact to light touch throughout Motor: Strength is 5/5 in the bilateral upper and lower extremities.   Shoulder shrug is equal and symmetric.  There is no pronator drift.  Movement examination: Tone: There is normal tone in the upper and lower extremities. Abnormal movements: No tremor. Coordination:  There is decremation with RAM's, with any form of RAMS, including alternating supination and pronation of the forearm, hand opening and closing, finger taps, heel taps and toe taps on the right Gait and Station: The patient pushes off of the chair to arise.  However, he actually walks quite well down the hall.  He carries a cane.  Stride length was good today.  He admits that he is actually doing really well right now.     ASSESSMENT/PLAN:  1.  Idiopathic Parkinson's disease, by history  -Patient actually did quite well today, but he has good and bad times.  It sounds like he really is having trouble with morning on.  I am going to increase his daytime dose and move dosages closer together.  He will take carbidopa/levodopa 25/100, 2 tablets at 7 AM, 2 tablets at 10 AM, 2 tablets at 2 PM and 1 tablet at 6 PM.  -He will continue carbidopa/levodopa 50/200 at bedtime.  -A phone alarm was sent today in the office for his dosage times.  -He has been to the emergency room several times with leg pain.  We discussed last visit that this is likely due to cramping.  I told him to try an extra half tablet of levodopa if this happens again.  I also told him to follow-up with his primary care physician to make sure that nothing is going on in the left his proximal right leg that is causing more  pain.    -will send PT and social work to the home  2.  PDD with hallucinations  -Nuplazid resolved his hallucinations.  He has girlfriend initially told me he had hallucinations, but she was not using the term correctly.  She was saying that he thought his leg was swollen when it was not and she got better hallucination.  She then was referring to his vivid dreams as hallucinations.  However, everyone agreed that he was no longer seeing things during the day.  He will continue the nuplazid  3.  REM behavior disorder  -He reports that clonazepam 0.25 mg at night has helped and has not affected daytime function or caused sleepiness.   4.  Follow up is anticipated in the next few months, sooner should new neurologic issues arise.  Much greater than 50% of this visit was spent in counseling and coordinating care.  Total face to face time:  30 min   Cc:  [name], MD

## 2018-03-28 NOTE — ED Notes (Signed)
Patient verbalizes understanding of discharge instructions. Opportunity for questioning and answers were provided. Armband removed by staff, pt discharged from ED.  

## 2018-03-29 ENCOUNTER — Telehealth: Payer: Self-pay | Admitting: Psychology

## 2018-03-29 NOTE — Telephone Encounter (Signed)
    TC to Norvel Richards the  [place] Work Librarian, academic at [employer].  I left a message in reference to wanting to referring the patient to case management services.  [facility] offer short-term case management services which may be very appropriate for the patient to help establish appropriate care and help with challenges with his medication compliance.  I left a message asking her to call me back and left my contact information. If I do not hear back from her before the patient's appointment tomorrow, I may recommend the patient to go directly to the Department of Social Services and speak to an intake worker on personal care services, short-term case management.    If needed in the future her contact information/office location:   [place], [place] [address] Phone: [phone] Fax: [phone] Email: [email or web address]

## 2018-03-30 ENCOUNTER — Ambulatory Visit (INDEPENDENT_AMBULATORY_CARE_PROVIDER_SITE_OTHER): Payer: Medicare HMO | Admitting: Neurology

## 2018-03-30 ENCOUNTER — Encounter: Payer: Self-pay | Admitting: Psychology

## 2018-03-30 ENCOUNTER — Encounter: Payer: Self-pay | Admitting: Neurology

## 2018-03-30 VITALS — BP 96/60 | HR 72 | Ht 71.2 in | Wt 212.0 lb

## 2018-03-30 DIAGNOSIS — G20 Parkinson's disease: Secondary | ICD-10-CM | POA: Diagnosis not present

## 2018-03-30 NOTE — Progress Notes (Signed)
I help the patient to reset his telephone alarms to help with medication compliance.  He reports that the previous alarms have been working well so I made the adjustments that reflected the changes in dosage from his appointment today.  The patient has been referred to physical therapy and social work via home health.  I will attempt to get in contact with the home health agency to talk with the social worker to share some of the identified needs of the patient at this time.  I will plan to call tomorrow on Friday, October 25 after the referral is received and processed.

## 2018-03-30 NOTE — Patient Instructions (Addendum)
Take carbidopa/levodopa 25/100, 2 tablets at 7am, 2 tablets at 10am, 2 tablets at 2pm, 1 tablet at 6pm  Continue carbidopa/levodopa 50/200 at bedtime (9-10pm)  I will send physical therapy to work with you at your house.

## 2018-04-05 ENCOUNTER — Telehealth: Payer: Self-pay | Admitting: Psychology

## 2018-04-05 NOTE — Telephone Encounter (Signed)
3 calls made in order as follows:   TC to [place] to get an update on social work services. There was a mistake on their end and that part of the order was missed. They put it in and patient should get social work services soon. They also asked for verbal order for PT. I confirmed the order and also notified Dr. Carles Collet of this.  Social work services should start now going forward.  [facility] has access to epic and I asked that they review the chart and specifically my notes to understand some of the complexities and needs that he has at this time.  TC to [employer].  Spoke with Hughes Better with the resources for the living department.  Their number is [phone].  Their role is to primarily provide resources for the patient's when they call in.  I shared that I need to make a referral for the patient have a clinical case manager.  She took the referral information on the phone and was going to process it with her Freight forwarder.    TC call to the Patient: Telephone call to the patient to let him know that I made a referral with his insurance company and the also [facility] social worker should be in touch with him.  I wanted him to know so he knew to return telephone calls in the case that they had to leave a message. He is now aware.

## 2018-04-06 ENCOUNTER — Telehealth: Payer: Self-pay | Admitting: Psychology

## 2018-04-06 NOTE — Telephone Encounter (Signed)
Received a telephone call from the [facility] social worker.  She shared that she had went out to patient's home the day prior.  She reports that patient is very active and engaged with both rock steady boxing and the senior center.  She does not think that there is going to be many resources available to him at this time.  I shared a little bit more in depth about the patient's social situation.  While he looks like at face value he is very independent and engaged, he struggles with being able to navigate his healthcare.  With living in between 2 households that do not work together he struggles with congruency in his care plan.  In talking with the social worker one of the things that was identified is that the patient may benefit from switching to a pharmacy that will deliver his medications at his home, be more accessible to him with questions and needs as they come up and will also prepackage his medication in a pill pack to reduce confusion and maximize medication compliance.  This may be very helpful as he goes from house to house throughout the week.  The plan is that that she is going to enroll him into that pharmacy after speaking with him getting permission.  If he is not interested she will contact me back and let me know.  I will contact her back to get the name of the pharmacy as I did not write it down.

## 2018-04-13 ENCOUNTER — Other Ambulatory Visit: Payer: Self-pay | Admitting: Neurology

## 2018-04-13 ENCOUNTER — Telehealth: Payer: Self-pay | Admitting: Neurology

## 2018-04-13 MED ORDER — CARBIDOPA-LEVODOPA 25-100 MG PO TABS: ORAL_TABLET | ORAL | 1 refills | Status: DC

## 2018-04-13 MED ORDER — CARBIDOPA-LEVODOPA ER 50-200 MG PO TBCR: 1.0000 | EXTENDED_RELEASE_TABLET | Freq: Every day | ORAL | 1 refills | Status: DC

## 2018-04-13 NOTE — Telephone Encounter (Signed)
Called back - they received electronic RX.

## 2018-04-13 NOTE — Telephone Encounter (Signed)
Pharmacy called regarding a prescription request sent to the office for Carbidopa 25/100 and 50 200. Please Call [phone] or Fax # [phone]. Thanks

## 2018-04-18 ENCOUNTER — Ambulatory Visit: Payer: Self-pay | Admitting: Neurology

## 2018-05-19 NOTE — Progress Notes (Signed)
Theodore King was seen today in the movement disorders clinic for neurologic consultation at the request of Theodore Dy, MD.  The consultation is for the evaluation of PD.  Pt currently under the care of Dr. Leta King.  The records that were made available to me were reviewed.  Pts girlfriend in the waiting room but he refused to allow her in (I asked few times).  His first symptom was left greater than right upper extremity tremor in 2012.  It started after a tear in the rotator cuff, which he thought was the initial cause.  Around that time, he also developed shuffling gait.  It appears that he started levodopa around that time and was not sure that it was beneficial.  In 2013, he started on pramipexole and did think it was beneficial for tremor.  He did notice, however, that with the course of time when he increased levodopa that it did seem to be helpful.  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  He last saw Dr. Leta King in June, 2018.  He has been seen at the emergency room several times since then.  He went to the emergency room on December 2 with complaints of leg spasm and increasing tremors.  He admitted he had not taken his Parkinson's medication the day he was seen in the emergency room (seen around noon).  He had also been given baclofen by his primary care physician, but has not picked that up from the pharmacy.  The biggest issue, according to the emergency room, was noncompliance with medication (also noted in Dr. Gladstone King notes).  Once given medication in the emergency room he felt better.  He was discharged to [facility].  He came back to the emergency room on June 25, 2017 for similar complaints of generalized weakness.  No weakness was found on examination and he was discharged home.  He was given a walker.  Pt reports that he is on carbidopa/levodopa 25/100, 1 three times per day.  He is supposed to be on 2 po tid.  He doesn't know other  meds as daughter takes care of them.  Pramipexole and azilect are listed.    Specific Symptoms:  Tremor: Yes.   Family hx of similar:  No. Voice: "I have a tremor in my voice." Sleep: trouble getting to sleep  Vivid Dreams:  Yes.   - "Its like I see things"  Acting out dreams:  No. Wet Pillows: Yes.   Postural symptoms:  Yes.    Falls?  Yes.  , states that he has had 2 falls.  Last fall was about 2 weeks ago.  He was bending over to put on shoes and he kept going.  Didn't get hurt Bradykinesia symptoms: shuffling gait, slow movements and difficulty getting out of a chair Loss of smell:  No. Loss of taste:  No. Urinary Incontinence:  yes but has urinary urgency and occasionally will wear depends if goes out of town Difficulty Swallowing:  No. Handwriting, micrographia: Yes.   Trouble with ADL's:  No.  Trouble buttoning clothing: No. Depression:  No., but "I get angry at myself" Memory changes:  No. - patient denies memory change.  Daughter (23y/o) lives with the patient.  Has a girlfriend.  Puts med in pillbox and girlfriend prepares it.  Daughter does grocery shopping and pays bills.  Pt does not "but I haven't gotten my license back yet."  He cannot tell me why he lost it but may  be from memory. Hallucinations:  Yes (initially denies and then says "I see people but I know there are not people."  visual distortions: yes - "I think something is crawling on the floor and then I realize nothing is there" N/V:  No. Lightheaded:  No.  Syncope: No. Diplopia:  Yes.   Dyskinesia:  No.  Neuroimaging of the brain has previously been performed.  It is available for my review today.  Had MRI brain in 2013 and it showed rare T2 hyperintensity.    09/29/17 update: Patient was worked in today for a follow-up.  He is seen sooner than expected.  He is accompanied by his girlfriend who supplements the history.  He is also on Azilect.  Nuplazid was started last visit.  There has been much confusion over  this medication and he/his daughter were both advised to call Theodore King and we have given the many samples.  I received a call at the end of March complaining about more difficulty walking and episodes of freezing.  Confusion about what he was actually taking and how it was prescribed persisted.  Ultimately, it turns out that the patient was still on pramipexole 1 mg, 2 tablets twice per day.  I decreased that to 1 tablet twice a day for a week and then once per day for a week and then discontinued that medication. He is off of that and has been for 2 weeks.   At the same time, I increased his carbidopa/levodopa 25/1 100 to 2 tablets in the morning, 2 in the afternoon (having trouble remembering that and taking it at 2-3pm instead of 11am-12pm)  and 1 in the evening.  Girlfriend reports that he couldn't even get dressed on his own this AM but now that medication has kicked in he can walk normally.  He has trouble at night and she has to lift his legs in the bed.  Sounds like he has wearing off of medication.  He went into a restaurant bathroom and then could not get out of the restaurant bathroom and she did not know what to do since it was a men's bathroom.  She calls these "spells."  Reports that he is taking baclofen 4 times per day, 20 mg each.  Been on that for "at least 2-3 months."  He is having hallucination once a day or once every 2 days.  He never mentions it to his girlfriend.  He recently had a hallucination when he looked out the window and thought someone was outside taking something apart.  He is very active at night.  Supposed to be on clonazepam, but really has not been taking it.  He is screaming at night and jerking.  Lives with daughter who is at school but stays with girlfriend 3-4 days per week.     He was seen in the emergency room on September 19, 2017.  Those records are reviewed.  He was seen for leg pain and swelling.  Ultrasound was negative for DVT and he was discharged home.    10/13/17  update: The patient is seen today in follow-up.  His girlfriend drove him here but refused to come back to the room when asked by staff members.   He is on carbidopa/levodopa 25/100, 2 tablets at 7 AM/2 tablets at 11 AM and 1 tablet at 4 PM and last visit we added carbidopa/levodopa 50/200 at bedtime.  It is difficult to assess efficacy of this one.   He does state that the alarm  that we sit on his phone last visit has really helped him remember taking his medication.  I asked the patient last visit to check and see whether or not he was on pramipexole, 2 mg twice daily. He brings a med list in today and he is not on that.  He is still on Azilect.  We added very low-dose clonazepam last visit, 0.25 mg, for REM behavior disorder.  "I believe the dreams are gone."  He had one fall since our last visit where he tripped up step.  Didn't get hurt.  He states that hallucinations are gone (baclofen d/c at our last visit but he brings that bottle in today and he is not sure if he is on it).  He still has occasional freezing but not bad per patient.  He asks me about the "new medication" we discussed last visit.   He is going to [facility] 2 days per week.  States that he got in shoulder yesterday and feels better.    01/16/18 update: Patient follows up today for Parkinson's disease and dementia.  Pts accompanied by girlfriend who supplements the hx.  He is supposed to be on carbidopa/levodopa 25/100, 2 tablets at 7 AM, 2 tablets at 11 AM, 1 tablet at 4 PM and carbidopa/levodopa 50/200 at bedtime.  However, girlfriend reports that pharmacy wouldn't give her the IR (pharmacy not open so didn't call) and so she is instead giving him the carbidopa/levodopa 50/200 at 7am/11am/4pm.  This has been so for about a week.  He remains on Azilect as well.  In regards REM behavior disorder, low-dose clonazepam, 0.25 mg, has helped.  He ran out of this about 2 nights ago per girlfriend. Records have been reviewed since our last visit.  There  have been several calls/correspondences with his family.  He has also been in the emergency room in July and in August.  In July, patient was in the emergency room for confusion and slurred speech.  MRI of the brain was obtained on December 12, 2017 and was unremarkable.  Patient was in the emergency room again on January 09, 2017 with generalized weakness.  Family was hoping for nursing placement for rehab.  Social work at the hospital investigated and stated that his insurance would not pay for rehab.  Zanaflex was given for him for muscle spasms. They report he is not taking the zanaflex.   However, when he describes it, he is actually having freezing of the right leg.  Having trouble turning over in the bed at night and getting out of chairs.  No hallucinations.  On nuplazid.    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  He is accompanied by his girlfriend who supplements the history.  He was worked in today.  He and his family walked into the office 2 days ago and stated that he was worse and wanted to be worked in, and this was the appointment we were able to give him.  He is on carbidopa/levodopa 25/100, 2 tablets at 7 AM/2 tablets at 11 AM/1 tablet at 4 PM and carbidopa/levodopa 50/200 at bed time. He describes trouble with first morning on.  Girlfriend describes trouble with him getting in and out of bed.  He has an alarm set on the phone to remind him to take it and that system works well.   I stopped his Azilect and Zanaflex last visit.  He is on clonazepam 0.25 mg at  bedtime.  Records are reviewed since our last visit.  The patient has been in the emergency room several times.  He was there on September 10 with leg pain and was given Flexeril.  Reports he is not taking that.  He was there on October 5, also with leg pain.  Work-up was negative and he was sent home that day.  They called here after that visit and we told them to make  sure that he was taking medications properly because if not, this could cause leg cramping, which was our suspicion.  He was back in the emergency room on October 22 with chest pain, which was felt related to reflux.  Doing well with hallucinations with nuplazid.  05/23/18 update: Patient is seen today for follow-up for Parkinson's disease and Parkinson's related dementia.  He is accompanied by his girlfriend who supplements the history.  His medication was increased last visit so that he is on carbidopa/levodopa 25/100, 2 tablets at 7 AM, 2 tablets at 10 AM, 2 tablets at 2 PM and 1 tablet at 6 PM.  He is also on carbidopa/levodopa 50/200 at bedtime. They don't know what he is taking.  They bring a bottle but it isn't the same instructions as he is supposed to be on (its an old bottle).  They think they are still taking medication at 2 at 7am/2 at 11am/1 at 4pm.   He is supposed to be on clonazepam, 0.25 mg at bedtime but they have stopped that. States that he hasn't really been having dreams.  He is also on nuplazid for hallucinations.  He is free of hallucinations - "that has been straighted out."  Girlfriend describes freezing in the day and trouble with first morning on.  Physical therapy and social work was sent to the home after our last visit. Pt states that PT only came 4 times to the home. No falls.  Dr. Mancel Bale just started patient on flomax.  Just started last night.  Has f/u appt in 2 weeks with pcp.   PREVIOUS MEDICATIONS: Sinemet  ALLERGIES:  No Known Allergies  CURRENT MEDICATIONS:  Outpatient Encounter Medications as of 05/23/2018  Medication Sig  . albuterol (PROVENTIL HFA;VENTOLIN HFA) 108 (90 BASE) MCG/ACT inhaler Inhale 2 puffs into the lungs every 6 (six) hours as needed for wheezing or shortness of breath.  . Aspirin-Acetaminophen-Caffeine (GOODY HEADACHE PO) Take 1 packet by mouth as needed (for headaches or pain).  . carbidopa-levodopa (SINEMET CR) 50-200 MG tablet TAKE 1 TABLET  BY MOUTH AT BEDTIME  . carbidopa-levodopa (SINEMET CR) 50-200 MG tablet Take 1 tablet by mouth at bedtime.  . carbidopa-levodopa (SINEMET IR) 25-100 MG tablet 2 tablets at 7AM, 2 tablets at 10AM, 2 tablets at 2PM, 1 tablet at 6pm  . Cholecalciferol (VITAMIN D-3) 1000 units CAPS Take 1,000 Units by mouth 3 (three) times a week.  . clonazePAM (KLONOPIN) 0.5 MG tablet Take 0.5 tablets (0.25 mg total) by mouth at bedtime.  . metFORMIN (GLUCOPHAGE) 500 MG tablet Take 500 mg by mouth daily with breakfast.   . pantoprazole (PROTONIX) 40 MG tablet Take 1 tablet (40 mg total) by mouth daily.  Debby Freiberg Tartrate (NUPLAZID) 34 MG CAPS Take 34 mg by mouth daily.  . predniSONE (DELTASONE) 10 MG tablet Take 10 mg by mouth 2 (two) times daily.  Marland Kitchen UNABLE TO FIND Flexogenix   No facility-administered encounter medications on file as of 05/23/2018.     PAST MEDICAL HISTORY:   Past Medical History:  Diagnosis Date  . Abnormal dreams 09/06/2013  . Disorders of bursae and tendons in shoulder region, unspecified   . Hyperglycemia 05/18/2013  . Hyperlipidemia LDL goal < 100 05/18/2013  . Other and unspecified hyperlipidemia   . Other and unspecified hyperlipidemia   . 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 Paralysis agitans ([place]) 05/18/2013  . Parkinson's disease ([place])   . 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 SOB (shortness of breath) 03/23/2018  . Unspecified vitamin D deficiency   . Unspecified vitamin D deficiency   . Vertigo, benign paroxysmal 11/05/2016  . Vitamin D deficiency 05/18/2013    PAST SURGICAL HISTORY:   Past Surgical History:  Procedure Laterality Date  . APPENDECTOMY    . ROTATOR CUFF REPAIR     right    SOCIAL HISTORY:   Social History   Socioeconomic History  . Marital status: Legally Separated    Spouse name: Not on file  . Number of children: 3  . Years of education: 12th  . Highest education level: Not on file  Occupational History    Employer:  RETIRED  Social Needs  . Financial resource strain: Not on file  . Food insecurity:    Worry: Not on file    Inability: Not on file  . Transportation needs:    Medical: Not on file    Non-medical: Not on file  Tobacco Use  . Smoking status: Former Research scientist (life sciences)  . Smokeless tobacco: Never Used  Substance and Sexual Activity  . Alcohol use: Yes    Comment: occasionally ("a sip every now and then")  . Drug use: No  . Sexual activity: Never  Lifestyle  . Physical activity:    Days per week: Not on file    Minutes per session: Not on file  . Stress: Not on file  Relationships  . Social connections:    Talks on phone: Not on file    Gets together: Not on file    Attends religious service: Not on file    Active member of club or organization: Not on file    Attends meetings of clubs or organizations: Not on file    Relationship status: Not on file  . Intimate partner violence:    Fear of current or ex partner: Not on file    Emotionally abused: Not on file    Physically abused: Not on file    Forced sexual activity: Not on file  Other Topics Concern  . Not on file  Social History Narrative  . Not on file    FAMILY HISTORY:   Family Status  Relation Name Status  . Mother  Deceased  . Father  Deceased  . Brother Collins Scotland Deceased  . Sister Kyung Bacca Deceased  . Daughter [employer]  . Son Tessa Lerner  . Sister Hulda Marin Deceased  . Brother [employer]  . Brother [employer]  . Brother [employer]  . Brother [employer]  . Brother [employer]  . Brother [employer]  . Daughter [name] Alive  . Neg Hx  (Not Specified)    ROS: Review of Systems  Constitutional: Negative.   HENT: Negative.   Eyes: Negative.   Cardiovascular: Negative.   Gastrointestinal: Negative.   Genitourinary: Positive for urgency.  Musculoskeletal: Positive for myalgias (R leg).  Skin: Negative.   Psychiatric/Behavioral: Positive for memory loss.    PHYSICAL EXAMINATION:    VITALS:     Vitals:   05/23/18 0814  BP: 118/60  Pulse: 74  SpO2: 95%  Weight: 213 lb (96.6 kg)  Height: 5' 11.5" (1.816 m)     Orientation: The patient is alert and oriented to person and place. Montreal Cognitive Assessment  07/19/2017  Visuospatial/ Executive (0/5) 1  Naming (0/3) 2  Attention: Read list of digits (0/2) 1  Attention: Read list of letters (0/1) 0  Attention: Serial 7 subtraction starting at 100 (0/3) 1  Language: Repeat phrase (0/2) 2  Language : Fluency (0/1) 1  Abstraction (0/2) 0  Delayed Recall (0/5) 0  Orientation (0/6) 4  Total 12  Adjusted Score (based on education) 13   GEN:  The patient appears stated age and is in NAD. HEENT:  Normocephalic, atraumatic.  The mucous membranes are moist. The superficial temporal arteries are without ropiness or tenderness. CV:  RRR Lungs:  CTAB Neck/HEME:  There are no carotid bruits bilaterally.  Neurological examination:  Orientation: The patient is alert and oriented x2. Cranial nerves: There is good facial symmetry. The speech is fluent and clear. Soft palate rises symmetrically and there is no tongue deviation. Hearing is intact to conversational tone. Sensation: Sensation is intact to light touch throughout Motor: Strength is 5/5 in the bilateral upper and lower extremities.   Shoulder shrug is equal and symmetric.  There is no pronator drift.  Movement examination: Tone: There is normal tone in the upper and lower extremities. Abnormal movements: No tremor. Coordination:  There is no decremation, with any form of RAMS, including alternating supination and pronation of the forearm, hand opening and closing, finger taps, heel taps and toe taps. Gait and Station: The patient pushes off of the chair to arise.  He ambulates well down the hall.  There is no shuffling.  He is not short staffed.   ASSESSMENT/PLAN:  1.  Idiopathic Parkinson's disease, by history  -Patient actually did quite well today, but he has good and  bad times.  It sounds like he really is having trouble with morning on.  He is not an apokyn candidate.  He was supposed to increase his daytime dose and move the dosages closer together last visit.  I wrote this out on his AVS, I sent a prescription and the timing of the medicine was written on the bottle and I set phone alarms for him last visit.  Despite all of this, they did not change how he took his medicine and he is still having more freezing episodes.  I resent medication to the pharmacy.  I took his old bottles and wrote the new dosages on them.  I asked the pharmacy to get rid of all prior prescriptions.  Ultimately, however, this is a social issue and we have had issues with this patient/family for quite some time.  We have had social work extensively deal with the patient, both in his home and out of the home.  Unfortunately, he does not have a strong support network.  I suspect this will be an ongoing issue.  -He will continue carbidopa/levodopa 50/200 at bedtime.   2.  PDD with hallucinations  -Nuplazid resolved his hallucinations.  He is free of hallucinations.  His girlfriend asked me why they have to keep calling the company and I explained to her that is because they are on patient assistance and would recommend that they keep up with the company so that he maintains his patient assistance.  3.  REM behavior disorder  -He was on low-dose clonazepam, 0.25 mg.  They have stopped that.  At this point, I did not restarted.  We will just need to watch him closely.  4.  Leg pain  -don't think all related to cramping.  He needs to get back to Dr. Lynann Bologna.  We discussed this today.  5.  Urinary urgency/frequency/incontinence  -Was started on Flomax yesterday by primary care.  6.  Follow-up with me in the next 4 months, sooner should new neurologic issues arise.  Much greater than 50% of this visit was spent in counseling and coordinating care.  Total face to face time:  25 min   Cc:   Theodore Dy, MD

## 2018-05-23 ENCOUNTER — Ambulatory Visit (INDEPENDENT_AMBULATORY_CARE_PROVIDER_SITE_OTHER): Payer: Medicare HMO | Admitting: Pulmonary Disease

## 2018-05-23 ENCOUNTER — Ambulatory Visit (INDEPENDENT_AMBULATORY_CARE_PROVIDER_SITE_OTHER): Payer: Medicare HMO | Admitting: Neurology

## 2018-05-23 ENCOUNTER — Encounter: Payer: Self-pay | Admitting: Neurology

## 2018-05-23 ENCOUNTER — Other Ambulatory Visit: Payer: Self-pay | Admitting: Internal Medicine

## 2018-05-23 ENCOUNTER — Encounter: Payer: Self-pay | Admitting: Pulmonary Disease

## 2018-05-23 VITALS — BP 126/58 | Ht 71.5 in | Wt 212.4 lb

## 2018-05-23 VITALS — BP 118/60 | HR 74 | Ht 71.5 in | Wt 213.0 lb

## 2018-05-23 DIAGNOSIS — R0609 Other forms of dyspnea: Secondary | ICD-10-CM | POA: Diagnosis not present

## 2018-05-23 DIAGNOSIS — G20 Parkinson's disease: Secondary | ICD-10-CM | POA: Diagnosis not present

## 2018-05-23 DIAGNOSIS — R441 Visual hallucinations: Secondary | ICD-10-CM

## 2018-05-23 DIAGNOSIS — F0280 Dementia in other diseases classified elsewhere without behavioral disturbance: Secondary | ICD-10-CM

## 2018-05-23 DIAGNOSIS — R350 Frequency of micturition: Secondary | ICD-10-CM

## 2018-05-23 MED ORDER — CARBIDOPA-LEVODOPA 25-100 MG PO TABS: ORAL_TABLET | ORAL | 1 refills | Status: DC

## 2018-05-23 NOTE — Progress Notes (Signed)
Synopsis: Referred in 05/2018 for SOB  Subjective:   PATIENT ID: Theodore King GENDER: male DOB: [birth year], MRN: [id number]   HPI  Chief Complaint  Patient presents with  . Consult    SOB and legs swelling for 2-3 weeks. mildly prod cough - white    [name] is a 72 year old male with Parkinson's disease who presents as a new consult for shortness of breath on exertion.  He reports hx of shortness of breath for the last 2-3 weeks worsened with exertion. Associated with wheezing and intermittent productive cough with white sputum. He also reports dull chest pain across his the middle of his chest that he ranks 5 out of 10. He also has shooting leg pain. Symptoms aggravated with walking uphill. Improved after resting for 10-20 minutes. He uses albuterol inhaler at least twice a day but unsure if this actually provides any symptoms relief. Denies fevers, chills. No headaches or dizziness. He needs assistance with ambulation so often does not move. He reports leg swelling that worsens by the end of the day. Denies childhood respiratory illness. No recent infection.  Social History: Former smoker. Quit 30 years ago. Smoked 8-9 years with pack lasting one week.  Environmental exposures: None  I have personally reviewed patient's past medical/family/social history, allergies, current medications.  Past Medical History:  Diagnosis Date  . Abnormal dreams 09/06/2013  . Disorders of bursae and tendons in shoulder region, unspecified   . Hyperglycemia 05/18/2013  . Hyperlipidemia LDL goal < 100 05/18/2013  . Other and unspecified hyperlipidemia   . Other and unspecified hyperlipidemia   . 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 Paralysis agitans ([place]) 05/18/2013  . Parkinson's disease ([place])   . 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 SOB (shortness of breath) 03/23/2018  . Unspecified vitamin D deficiency   . Unspecified vitamin D deficiency   . Vertigo,  benign paroxysmal 11/05/2016  . Vitamin D deficiency 05/18/2013     Family History  Problem Relation Age of Onset  . Cancer Mother   . Cancer Father   . Stroke Brother   . Cancer Sister   . Cancer Sister   . Colon cancer Neg Hx   . Esophageal cancer Neg Hx   . Rectal cancer Neg Hx   . Stomach cancer Neg Hx      Social History   Occupational History    Employer: RETIRED  Tobacco Use  . Smoking status: Former Smoker    Packs/day: 0.50    Years: 20.00    Pack years: 10.00    Last attempt to quit: 05/23/1974    Years since quitting: 44.0  . Smokeless tobacco: Never Used  Substance and Sexual Activity  . Alcohol use: Not Currently  . Drug use: No  . Sexual activity: Never    No Known Allergies   Outpatient Medications Prior to Visit  Medication Sig Dispense Refill  . albuterol (PROVENTIL HFA;VENTOLIN HFA) 108 (90 BASE) MCG/ACT inhaler Inhale 2 puffs into the lungs every 6 (six) hours as needed for wheezing or shortness of breath. 1 Inhaler 2  . Aspirin-Acetaminophen-Caffeine (GOODY HEADACHE PO) Take 1 packet by mouth as needed (for headaches or pain).    . carbidopa-levodopa (SINEMET CR) 50-200 MG tablet TAKE 1 TABLET BY MOUTH AT BEDTIME 90 tablet 1  . carbidopa-levodopa (SINEMET IR) 25-100 MG tablet 2 tablets at 7AM, 2 tablets at 10AM, 2 tablets at 2PM, 1 tablet at 6pm 630 tablet 1  .  KRILL OIL PO Take by mouth daily.    . metFORMIN (GLUCOPHAGE) 500 MG tablet Take 500 mg by mouth daily with breakfast.     . naproxen sodium (ALEVE) 220 MG tablet Take 220 mg by mouth.    Debby Freiberg Tartrate (NUPLAZID) 34 MG CAPS Take 34 mg by mouth daily. 90 capsule 1  . tamsulosin (FLOMAX) 0.4 MG CAPS capsule Take 0.4 mg by mouth daily.     No facility-administered medications prior to visit.     Review of Systems  Constitutional: Negative for chills, diaphoresis, fever, malaise/fatigue and weight loss.  HENT: Negative for congestion and sore throat.   Respiratory: Positive for  cough, sputum production, shortness of breath and wheezing. Negative for hemoptysis.   Cardiovascular: Positive for chest pain and leg swelling. Negative for orthopnea and PND.  Gastrointestinal: Negative for abdominal pain, heartburn and nausea.  Genitourinary: Negative for frequency.  Musculoskeletal: Negative for myalgias.  Skin: Negative for rash.  Neurological: Negative for dizziness, weakness and headaches.  Endo/Heme/Allergies: Does not bruise/bleed easily.     Objective:   Vitals:   05/23/18 1500  BP: (!) 126/58  SpO2: 98%  Weight: 212 lb 6.4 oz (96.3 kg)  Height: 5' 11.5" (1.816 m)   SpO2: 98 % O2 Device: None (Room air)  Physical Exam General: Well-appearing, no acute distress HENT: [place], AT Eyes: EOMI, no scleral icterus Respiratory: Decreased breath sounds bilaterally. Clear to auscultation bilaterally.  No crackles, wheezing or rales Cardiovascular: RRR, -M/R/G, no JVD GI: BS+, soft, nontender Extremities:No lower extremity swelling, distal pulses palpable Neuro: AAO x4, CNII-XII grossly intact, resting tremor in left arm  Skin: Intact, no rashes or bruising Psych: Normal mood, normal affect  Chest imaging:  CXR 03/27/18 Chronic left hemidiaphragm elevation compared to prior imaging in EMR  PFT: None on file  I have personally reviewed the above labs, images and tests noted above.    Assessment & Plan:   Discussion: 72 year old male with Parkinson's disease with presents with new onset of shortness of breath. Symptoms may be secondary to respiratory etiology. Will also consider atypical cardiac presentation of symptoms. Plan to rule out PFTs. If normal, will recommend cardiac evaluation.  Dyspnea on exertion --ORDER pulmonary function test  Follow-up in 3 weeks    Orders Placed This Encounter  Procedures  . Pulmonary function test    Standing Status:   Future    Standing Expiration Date:   05/24/2019    Scheduling Instructions:     Please  schedule PFT with ROV in 3 weeks    Order Specific Question:   Where should this test be performed?    Answer:    Pulmonary    Order Specific Question:   Full PFT: includes the following: basic spirometry, spirometry pre & post bronchodilator, diffusion capacity (DLCO), lung volumes    Answer:   Full PFT  No orders of the defined types were placed in this encounter.   Return in about 3 weeks (around 06/13/2018).  Jorell Agne Rodman Pickle, MD [facility] 05/23/2018 7:03 PM  Personal pager: [phone] If unanswered, please page CCM On-call: [phone]

## 2018-05-23 NOTE — Patient Instructions (Addendum)
Dyspnea on exertion --ORDER pulmonary function test  Follow-up in 3 wees

## 2018-05-23 NOTE — Patient Instructions (Addendum)
Take carbidopa/levodopa 25/100, 2 tablets at 7am, 2 tablets at 10am, 2 tablets at 2pm, 1 tablet at 6pm  Continue carbidopa/levodopa 50/200 at bedtime (9-10pm)  Continue Nuplazid one capsule daily.

## 2018-05-24 MED ORDER — CARBIDOPA-LEVODOPA ER 50-200 MG PO TBCR: 1.0000 | EXTENDED_RELEASE_TABLET | Freq: Every day | ORAL | 1 refills | Status: DC

## 2018-05-24 MED ORDER — PIMAVANSERIN TARTRATE 34 MG PO CAPS: 34.0000 mg | ORAL_CAPSULE | Freq: Every day | ORAL | 1 refills | Status: DC

## 2018-05-24 NOTE — Addendum Note (Signed)
Addended byAnnamaria Helling on: 05/24/2018 01:23 PM   Modules accepted: Orders

## 2018-05-29 ENCOUNTER — Ambulatory Visit
Admission: RE | Admit: 2018-05-29 | Discharge: 2018-05-29 | Disposition: A | Discharge status: Home / Self Care | Payer: Medicare HMO | Source: Ambulatory Visit | Attending: Internal Medicine | Admitting: Internal Medicine

## 2018-05-29 DIAGNOSIS — R350 Frequency of micturition: Secondary | ICD-10-CM

## 2018-06-06 ENCOUNTER — Encounter

## 2018-06-06 ENCOUNTER — Ambulatory Visit: Payer: Self-pay | Admitting: Neurology

## 2018-06-14 ENCOUNTER — Telehealth: Payer: Self-pay | Admitting: Neurology

## 2018-06-14 NOTE — Telephone Encounter (Signed)
Received note from [facility] that states, "[facility] is no longer able to fill Nuplazid prescriptions due to changes in the manufacturer's network. We have transferred the prescription to the above pharmacy and provided all information we have available. This patient has been informed of the prescription transfer and provided the pharmacy contact information for further refills and any future questions they may have."

## 2018-06-24 ENCOUNTER — Encounter (HOSPITAL_COMMUNITY): Payer: Self-pay | Admitting: Emergency Medicine

## 2018-06-24 ENCOUNTER — Other Ambulatory Visit: Payer: Self-pay

## 2018-06-24 ENCOUNTER — Emergency Department (HOSPITAL_COMMUNITY): Payer: Medicare HMO

## 2018-06-24 ENCOUNTER — Emergency Department (HOSPITAL_COMMUNITY)
Admission: EM | Admit: 2018-06-24 | Discharge: 2018-06-24 | Disposition: A | Discharge status: Home / Self Care | Payer: Medicare HMO | Attending: Emergency Medicine | Admitting: Emergency Medicine

## 2018-06-24 DIAGNOSIS — R0602 Shortness of breath: Secondary | ICD-10-CM | POA: Diagnosis not present

## 2018-06-24 DIAGNOSIS — R05 Cough: Secondary | ICD-10-CM | POA: Insufficient documentation

## 2018-06-24 DIAGNOSIS — R1032 Left lower quadrant pain: Secondary | ICD-10-CM | POA: Insufficient documentation

## 2018-06-24 DIAGNOSIS — R300 Dysuria: Secondary | ICD-10-CM | POA: Diagnosis not present

## 2018-06-24 DIAGNOSIS — Z87891 Personal history of nicotine dependence: Secondary | ICD-10-CM | POA: Diagnosis not present

## 2018-06-24 DIAGNOSIS — Z79899 Other long term (current) drug therapy: Secondary | ICD-10-CM | POA: Diagnosis not present

## 2018-06-24 DIAGNOSIS — R059 Cough, unspecified: Secondary | ICD-10-CM

## 2018-06-24 LAB — URINALYSIS, ROUTINE W REFLEX MICROSCOPIC
Bacteria, UA: NONE SEEN
Bilirubin Urine: NEGATIVE
Glucose, UA: NEGATIVE mg/dL
Hgb urine dipstick: NEGATIVE
Ketones, ur: NEGATIVE mg/dL
Nitrite: NEGATIVE
Protein, ur: NEGATIVE mg/dL
Specific Gravity, Urine: 1.01 (ref 1.005–1.030)
pH: 5 (ref 5.0–8.0)

## 2018-06-24 LAB — CBC WITH DIFFERENTIAL/PLATELET
Abs Immature Granulocytes: 0.02 K/uL (ref 0.00–0.07)
Basophils Absolute: 0 K/uL (ref 0.0–0.1)
Basophils Relative: 1 %
Eosinophils Absolute: 0.1 K/uL (ref 0.0–0.5)
Eosinophils Relative: 1 %
HCT: 44.6 % (ref 39.0–52.0)
Hemoglobin: 14.3 g/dL (ref 13.0–17.0)
Immature Granulocytes: 0 %
Lymphocytes Relative: 26 %
Lymphs Abs: 1.6 K/uL (ref 0.7–4.0)
MCH: 27.7 pg (ref 26.0–34.0)
MCHC: 32.1 g/dL (ref 30.0–36.0)
MCV: 86.4 fL (ref 80.0–100.0)
Monocytes Absolute: 0.4 K/uL (ref 0.1–1.0)
Monocytes Relative: 7 %
Neutro Abs: 4 K/uL (ref 1.7–7.7)
Neutrophils Relative %: 65 %
Platelets: 121 K/uL — ABNORMAL LOW (ref 150–400)
RBC: 5.16 MIL/uL (ref 4.22–5.81)
RDW: 13.2 % (ref 11.5–15.5)
WBC: 6.1 K/uL (ref 4.0–10.5)
nRBC: 0 % (ref 0.0–0.2)

## 2018-06-24 LAB — COMPREHENSIVE METABOLIC PANEL
ALT: 5 U/L (ref 0–44)
AST: 19 U/L (ref 15–41)
Albumin: 4 g/dL (ref 3.5–5.0)
Alkaline Phosphatase: 56 U/L (ref 38–126)
Anion gap: 10 (ref 5–15)
BUN: 13 mg/dL (ref 8–23)
CO2: 23 mmol/L (ref 22–32)
Calcium: 9.2 mg/dL (ref 8.9–10.3)
Chloride: 103 mmol/L (ref 98–111)
Creatinine, Ser: 1.05 mg/dL (ref 0.61–1.24)
GFR calc Af Amer: >60 mL/min (ref >60)
GFR calc non Af Amer: >60 mL/min (ref >60)
Glucose, Bld: 106 mg/dL — ABNORMAL HIGH (ref 70–99)
Potassium: 4.3 mmol/L (ref 3.5–5.1)
Sodium: 136 mmol/L (ref 135–145)
Total Bilirubin: 1.2 mg/dL (ref 0.3–1.2)
Total Protein: 6.9 g/dL (ref 6.5–8.1)

## 2018-06-24 LAB — I-STAT TROPONIN, ED: Troponin i, poc: 0.02 ng/mL (ref 0.00–0.08)

## 2018-06-24 LAB — BRAIN NATRIURETIC PEPTIDE: B Natriuretic Peptide: 23 pg/mL (ref 0.0–100.0)

## 2018-06-24 MED ORDER — OXYCODONE HCL 5 MG PO TABS
5.0000 mg | ORAL_TABLET | Freq: Once | ORAL | Status: AC
  Administered 2018-06-24: 5 mg via ORAL
  Filled 2018-06-24: qty 1

## 2018-06-24 MED ORDER — IOHEXOL 300 MG/ML  SOLN
100.0000 mL | Freq: Once | INTRAMUSCULAR | Status: AC | PRN
  Administered 2018-06-24: 125 mL via INTRAVENOUS

## 2018-06-24 NOTE — Discharge Instructions (Addendum)
Continue Levaquin until completed.  Please see your doctor on Monday or Tuesday for recheck.  Please return the emergency department if you develop any new or worsening symptoms in the meantime.

## 2018-06-24 NOTE — ED Triage Notes (Signed)
Patient arrived by POV, with c/o SOB since this afternoon. States he has had issues with urination and was given Levofloxacin since yesterday with no improvement. Has a difficult time getting up with hx Parkinson's. No c/o chest pain.

## 2018-06-24 NOTE — ED Notes (Signed)
Ambulated patient in hallway, SpO2 stayed at 100%, patient with no c/o SOB. Patient had a difficult time standing up and then getting back in bed. Patient back in bed, comfortable.

## 2018-06-24 NOTE — ED Notes (Signed)
Patient transported to X-ray 

## 2018-06-24 NOTE — ED Notes (Signed)
Patient verbalizes understanding of discharge instructions. Opportunity for questioning and answers were provided. Armband removed by staff, pt discharged from ED.  

## 2018-06-24 NOTE — ED Provider Notes (Signed)
[facility] Provider Note   CSN: [id number] Arrival date & time: 06/24/18  1757     History   Chief Complaint Chief Complaint  Patient presents with  . Shortness of King    HPI [facility] King is a 73 y.o. male with history of Parkinson's disease who presents with a 2-day history of shortness of King, Theodore frequency.  Patient was seen at his doctors and started on levothyroxine.  He continues to have Theodore difficulty as well as new left lower quadrant pain.  He has had associated clear, productive cough with his shortness of King.  The shortness of King is worse with exertion.  It is not worse lying down.  He denies any lower extremity edema.  He reports he has difficulty standing up from a sitting position, however this is baseline with his Parkinson's.  He denies any nausea, vomiting, diarrhea, chest pain.  HPI  Past Medical History:  Diagnosis Date  . Abnormal dreams 09/06/2013  . Disorders of bursae and tendons in shoulder region, unspecified   . Hyperglycemia 05/18/2013  . Hyperlipidemia LDL goal < 100 05/18/2013  . Other and unspecified hyperlipidemia   . Other and unspecified hyperlipidemia   . 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 Paralysis agitans ([place]) 05/18/2013  . Parkinson's disease ([place])   . 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 SOB (shortness of King) 03/23/2018  . Unspecified vitamin D deficiency   . Unspecified vitamin D deficiency   . Vertigo, benign paroxysmal 11/05/2016  . Vitamin D deficiency 05/18/2013    Patient Active Problem List   Diagnosis Date Noted  . SOB (shortness of King) 03/23/2018  . Vertigo, benign paroxysmal 11/05/2016  . Abnormal dreams 09/06/2013  . 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 Hyperglycemia 05/18/2013  . Paralysis agitans ([place]) 05/18/2013  . Vitamin D deficiency 05/18/2013  . Hyperlipidemia LDL goal < 100 05/18/2013  . Parkinson's disease ([place])  10/26/2012    Past Surgical History:  Procedure Laterality Date  . APPENDECTOMY    . ROTATOR CUFF REPAIR     right        Home Medications    Prior to Admission medications   Medication Sig Start Date End Date Taking? Authorizing Provider  albuterol (PROVENTIL HFA;VENTOLIN HFA) 108 (90 BASE) MCG/ACT inhaler Inhale 2 puffs into the lungs every 6 (six) hours as needed for wheezing or shortness of King. 07/04/13  Yes Lauree Chandler, NP  Aspirin-Acetaminophen-Caffeine (GOODY HEADACHE PO) Take 1 packet by mouth as needed (for headaches or pain).   Yes [provider]  carbidopa-levodopa (SINEMET CR) 50-200 MG tablet Take 1 tablet by mouth at bedtime. 05/24/18  Yes Tat, Eustace Quail, DO  carbidopa-levodopa (SINEMET IR) 25-100 MG tablet 2 tablets at 7AM, 2 tablets at 10AM, 2 tablets at 2PM, 1 tablet at 6pm Patient taking differently: Take 1-2 tablets by mouth See admin instructions. 2 tablets at 7AM, 2 tablets at 10AM, 2 tablets at [facility], 1 tablet at 6pm 05/23/18  Yes Tat, Eustace Quail, DO  levofloxacin (LEVAQUIN) 500 MG tablet Take 500 mg by mouth daily. For 7 days Started on 06-23-17   Yes [provider]  metFORMIN (GLUCOPHAGE) 500 MG tablet Take 500 mg by mouth daily with breakfast.    Yes [provider]  naproxen sodium (ALEVE) 220 MG tablet Take 440 mg by mouth daily as needed (pain).    Yes [provider]  Pimavanserin Tartrate (NUPLAZID) 34 MG CAPS  Take 34 mg by mouth daily. 05/24/18  Yes Tat, Eustace Quail, DO  tamsulosin (FLOMAX) 0.4 MG CAPS capsule Take 0.4 mg by mouth daily.   Yes [provider]    Family History Family History  Problem Relation Age of Onset  . Cancer Mother   . Cancer Father   . Stroke Brother   . Cancer Sister   . Cancer Sister   . Colon cancer Neg Hx   . Esophageal cancer Neg Hx   . Rectal cancer Neg Hx   . Stomach cancer Neg Hx     Social History Social History   Tobacco Use  . Smoking status: Former  Smoker    Packs/day: 0.50    Years: 20.00    Pack years: 10.00    Last attempt to quit: 05/23/1974    Years since quitting: 44.1  . Smokeless tobacco: Never Used  Substance Use Topics  . Alcohol use: Not Currently  . Drug use: No     Allergies   Patient has no known allergies.   Review of Systems Review of Systems  Constitutional: Negative for chills and fever.  HENT: Negative for facial swelling and sore throat.   Respiratory: Positive for cough and shortness of King.   Cardiovascular: Negative for chest pain.  Gastrointestinal: Positive for abdominal pain. Negative for nausea and vomiting.  Genitourinary: Positive for frequency. Negative for dysuria.  Musculoskeletal: Negative for back pain.  Skin: Negative for rash and wound.  Neurological: Negative for headaches.  Psychiatric/Behavioral: The patient is not nervous/anxious.      Physical Exam Updated Vital Signs BP (!) 143/74   Pulse 75   Temp 98.1 F (36.7 C) (Oral)   Resp 14   Ht 5\' 11"  (1.803 m)   Wt 102.5 kg   SpO2 100%   BMI 31.52 kg/m   Physical Exam Vitals signs and nursing note reviewed.  Constitutional:      General: He is not in acute distress.    Appearance: He is well-developed. He is not diaphoretic.  HENT:     Head: Normocephalic and atraumatic.     Mouth/Throat:     Pharynx: No oropharyngeal exudate.  Eyes:     General: No scleral icterus.       Right eye: No discharge.        Left eye: No discharge.     Conjunctiva/sclera: Conjunctivae normal.     Pupils: Pupils are equal, round, and reactive to light.  Neck:     Musculoskeletal: Normal range of motion and neck supple.     Thyroid: No thyromegaly.  Cardiovascular:     Rate and Rhythm: Normal rate and regular rhythm.     Heart sounds: Normal heart sounds. No murmur. No friction rub. No gallop.   Pulmonary:     Effort: Pulmonary effort is normal. No respiratory distress.     King sounds: Normal King sounds. No stridor. No  wheezing or rales.  Abdominal:     General: Bowel sounds are normal. There is no distension.     Palpations: Abdomen is soft.     Tenderness: There is abdominal tenderness in the left lower quadrant. There is no guarding or rebound.  Lymphadenopathy:     Cervical: No cervical adenopathy.  Skin:    General: Skin is warm and dry.     Coloration: Skin is not pale.     Findings: No rash.  Neurological:     Mental Status: He is alert.  Coordination: Coordination normal.     Comments: Tremor      ED Treatments / Results  Labs (all labs ordered are listed, but only abnormal results are displayed) Labs Reviewed  COMPREHENSIVE METABOLIC PANEL - Abnormal; Notable for the following components:      Result Value   Glucose, Bld 106 (*)    All other components within normal limits  CBC WITH DIFFERENTIAL/PLATELET - Abnormal; Notable for the following components:   Platelets 121 (*)    All other components within normal limits  URINALYSIS, ROUTINE W REFLEX MICROSCOPIC - Abnormal; Notable for the following components:   Leukocytes, UA TRACE (*)    All other components within normal limits  BRAIN NATRIURETIC PEPTIDE  I-STAT TROPONIN, ED    EKG EKG Interpretation  Date/Time:  Saturday June 24 2018 18:26:15 EST Ventricular Rate:  68 PR Interval:    QRS Duration: 87 QT Interval:  381 QTC Calculation: 406 R Axis:   -2 Text Interpretation:  Sinus rhythm Prolonged PR interval Borderline ST elevation, anterolateral leads Confirmed by Gerlene Fee [phone]) on 06/24/2018 8:44:58 PM   Radiology Dg Chest 2 View  Result Date: 06/24/2018 CLINICAL DATA:  Shortness of King today EXAM: CHEST - 2 VIEW COMPARISON:  March 27, 2018 FINDINGS: The heart size and mediastinal contours are stable. The aorta is tortuous. There is elevation of left hemidiaphragm unchanged. Both lungs are clear. The visualized skeletal structures are stable. IMPRESSION: No active cardiopulmonary disease.  Electronically Signed   By: Abelardo Diesel M.D.   On: 06/24/2018 19:39   Ct Abdomen Pelvis W Contrast  Result Date: 06/24/2018 CLINICAL DATA:  Left lower quadrant pain. Diverticulitis suspected. EXAM: CT ABDOMEN AND PELVIS WITH CONTRAST TECHNIQUE: Multidetector CT imaging of the abdomen and pelvis was performed using the standard protocol following bolus administration of intravenous contrast. CONTRAST:  16mL OMNIPAQUE IOHEXOL 300 MG/ML  SOLN COMPARISON:  Renal ultrasound 05/29/2018. No comparison CT. FINDINGS: Lower chest: Right lower lobe pulmonary nodules of maximally 4 mm. Left hemidiaphragm elevation. Normal heart size without pericardial or pleural effusion. Multivessel coronary artery atherosclerosis. Hepatobiliary: A too small to characterize high right a cyst hepatic lobe lesion. Normal gallbladder, without biliary ductal dilatation. Pancreas: Normal, without mass or ductal dilatation. Spleen: Normal in size, without focal abnormality. Adrenals/[facility] Tract: Normal adrenal glands. Normal kidneys, without hydronephrosis. Normal Theodore bladder. Stomach/Bowel: Proximal gastric underdistention. Normal colon and terminal ileum. Normal appendix including on coronal image 52. Normal small bowel. Vascular/Lymphatic: Advanced aortic and branch vessel atherosclerosis. No abdominopelvic adenopathy. Reproductive: Normal prostate. Other: No significant free fluid. Musculoskeletal: Advanced lumbosacral spondylosis. IMPRESSION: 1. No acute process in the abdomen or pelvis. 2. Coronary artery atherosclerosis. Aortic Atherosclerosis (ICD10-I70.0). 3. Right lung base nodules of maximally 4 mm. No follow-up needed if patient is low-risk. Non-contrast chest CT can be considered in 12 months if patient is high-risk. This recommendation follows the consensus statement: Guidelines for Management of Incidental Pulmonary Nodules Detected on CT Images: From the Fleischner Society 2017; Radiology 2017; 284:228-243.  Electronically Signed   By: Abigail Miyamoto M.D.   On: 06/24/2018 21:25    Procedures Procedures (including critical care time)  Medications Ordered in ED Medications  oxyCODONE (Oxy IR/ROXICODONE) immediate release tablet 5 mg (5 mg Oral Given 06/24/18 2050)  iohexol (OMNIPAQUE) 300 MG/ML solution 100 mL (125 mLs Intravenous Contrast Given 06/24/18 2059)     Initial Impression / Assessment and Plan / ED Course  I have reviewed the triage vital signs and the nursing notes.  Pertinent labs & imaging results that were available during my care of the patient were reviewed by me and considered in my medical decision making (see chart for details).     Patient presenting with shortness of King, worse on exertion with productive cough with clear sputum as well as some left-sided abdominal pain and Theodore frequency and dysuria.  Patient is currently taking Levaquin.  Labs are unremarkable.  Urine is clear.  CT abdomen pelvis is negative for acute process.  There are lung base nodules found, however patient is low risk and radiologist recommends no follow-up.  Chest x-ray is clear.  Patient ambulated with pulse ox with no shortness of King and saturated at 100%.  Will have patient continue Levaquin to cover UTI.  Follow-up to PCP in 3 to 4 days.  Patient and family understand agree with plan.  Patient vitals stable throughout ED course and discharged in satisfactory condition.  Patient also evaluated by my attending, Dr. Sedonia Small, who guided the patient's management and agrees with plan.  Final Clinical Impressions(s) / ED Diagnoses   Final diagnoses:  Shortness of King  Cough  Dysuria    ED Discharge Orders    None       Frederica Kuster, PA-C 06/25/18 Lewis Moccasin, MD 06/25/18 1850

## 2018-06-24 NOTE — ED Notes (Signed)
Condom Cath Placed

## 2018-06-24 NOTE — ED Notes (Signed)
Patient unable to get up and ambulate.

## 2018-06-24 NOTE — ED Notes (Signed)
Patient transported to CT 

## 2018-07-12 ENCOUNTER — Telehealth: Payer: Self-pay | Admitting: Neurology

## 2018-07-12 NOTE — Telephone Encounter (Signed)
Patient Daughter [name] called and wants to talk to someone about father Parkinsons and also has some question that she wanted to ask

## 2018-07-12 NOTE — Telephone Encounter (Signed)
Spoke with patient's daughter. She wanted to know what stage his PD was in. I made her aware Dr. Carles Collet doesn't do staging, as it doesn't help or add any information to treatment. She expressed understanding.

## 2018-07-14 ENCOUNTER — Ambulatory Visit: Payer: Self-pay | Admitting: Pulmonary Disease

## 2018-07-17 ENCOUNTER — Encounter: Payer: Self-pay | Admitting: Neurology

## 2018-08-08 ENCOUNTER — Ambulatory Visit: Payer: Self-pay | Admitting: Pulmonary Disease

## 2018-08-08 NOTE — Progress Notes (Deleted)
@Patient  ID: Theodore King, male    DOB: 12/07/1945, 73 y.o.   MRN: [id number]  No chief complaint on file.   Referring provider: [name], MD  HPI:  73 year old male former smoker initially referred to our office on 05/23/2018 for evaluation of shortness of breath and dyspnea on exertion.  PMH: Parkinson's, Parkinson's related dementia (managed by Dr. [facility]) Smoker/ Smoking History: Former smoker.  Quit 1990.  Smoked for 9 years.  With a pack lasting a week. Maintenance:   Pt of: Dr. Loanne Drilling  08/08/2018  - Visit   HPI  Tests:   06/24/2018-chest x-ray-no active cardiopulmonary disease  FENO:  No results found for: NITRICOXIDE  PFT: No flowsheet data found.  Imaging: No results found.    Specialty Problems      Pulmonary Problems   SOB (shortness of breath)      No Known Allergies  Immunization History  Administered Date(s) Administered  . Influenza Whole 03/07/2010, 03/29/2013  . Influenza, High Dose Seasonal PF 04/23/2018  . Influenza-Unspecified 02/10/2014  . PPD Test 10/20/2016  . Pneumococcal Polysaccharide-23 07/12/2011  . Tdap 07/12/2011    Past Medical History:  Diagnosis Date  . Abnormal dreams 09/06/2013  . Disorders of bursae and tendons in shoulder region, unspecified   . Hyperglycemia 05/18/2013  . Hyperlipidemia LDL goal < 100 05/18/2013  . Other and unspecified hyperlipidemia   . Other and unspecified hyperlipidemia   . 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 Paralysis agitans ([place]) 05/18/2013  . Parkinson's disease ([place])   . 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 SOB (shortness of breath) 03/23/2018  . Unspecified vitamin D deficiency   . Unspecified vitamin D deficiency   . Vertigo, benign paroxysmal 11/05/2016  . Vitamin D deficiency 05/18/2013    Tobacco History: Social History   Tobacco Use  Smoking Status Former Smoker  . Packs/day: 0.50  . Years: 20.00  . Pack years: 10.00  . Last  attempt to quit: 05/23/1974  . Years since quitting: 44.2  Smokeless Tobacco Never Used   Counseling given: Not Answered   Outpatient Encounter Medications as of 08/08/2018  Medication Sig  . albuterol (PROVENTIL HFA;VENTOLIN HFA) 108 (90 BASE) MCG/ACT inhaler Inhale 2 puffs into the lungs every 6 (six) hours as needed for wheezing or shortness of breath.  . Aspirin-Acetaminophen-Caffeine (GOODY HEADACHE PO) Take 1 packet by mouth as needed (for headaches or pain).  . carbidopa-levodopa (SINEMET CR) 50-200 MG tablet Take 1 tablet by mouth at bedtime.  . carbidopa-levodopa (SINEMET IR) 25-100 MG tablet 2 tablets at 7AM, 2 tablets at 10AM, 2 tablets at 2PM, 1 tablet at 6pm (Patient taking differently: Take 1-2 tablets by mouth See admin instructions. 2 tablets at 7AM, 2 tablets at 10AM, 2 tablets at 2PM, 1 tablet at 6pm)  . levofloxacin (LEVAQUIN) 500 MG tablet Take 500 mg by mouth daily. For 7 days Started on 06-23-17  . metFORMIN (GLUCOPHAGE) 500 MG tablet Take 500 mg by mouth daily with breakfast.   . naproxen sodium (ALEVE) 220 MG tablet Take 440 mg by mouth daily as needed (pain).   Debby Freiberg Tartrate (NUPLAZID) 34 MG CAPS Take 34 mg by mouth daily.  . tamsulosin (FLOMAX) 0.4 MG CAPS capsule Take 0.4 mg by mouth daily.   No facility-administered encounter medications on file as of 08/08/2018.      Review of Systems  Review of Systems   Physical Exam  There were no vitals taken for this  visit.  Wt Readings from Last 5 Encounters:  06/24/18 226 lb (102.5 kg)  05/23/18 212 lb 6.4 oz (96.3 kg)  05/23/18 213 lb (96.6 kg)  03/30/18 212 lb (96.2 kg)  03/27/18 240 lb (108.9 kg)     Physical Exam    Lab Results:  CBC    Component Value Date/Time   WBC 6.1 06/24/2018 1836   RBC 5.16 06/24/2018 1836   HGB 14.3 06/24/2018 1836   HCT 44.6 06/24/2018 1836   PLT 121 (L) 06/24/2018 1836   MCV 86.4 06/24/2018 1836   MCH 27.7 06/24/2018 1836   MCHC 32.1 06/24/2018 1836   RDW  13.2 06/24/2018 1836   LYMPHSABS 1.6 06/24/2018 1836   MONOABS 0.4 06/24/2018 1836   EOSABS 0.1 06/24/2018 1836   BASOSABS 0.0 06/24/2018 1836    BMET    Component Value Date/Time   NA 136 06/24/2018 1836   NA 141 10/22/2016   K 4.3 06/24/2018 1836   CL 103 06/24/2018 1836   CO2 23 06/24/2018 1836   GLUCOSE 106 (H) 06/24/2018 1836   BUN 13 06/24/2018 1836   BUN 14 10/22/2016   CREATININE 1.05 06/24/2018 1836   CALCIUM 9.2 06/24/2018 1836   GFRNONAA >60 06/24/2018 1836   GFRAA >60 06/24/2018 1836    BNP    Component Value Date/Time   BNP 23.0 06/24/2018 1836    ProBNP No results found for: PROBNP    Assessment & Plan:     No problem-specific Assessment & Plan notes found for this encounter.     Lauraine Rinne, NP 08/08/2018   This appointment was *** with over 50% of the time in direct face-to-face patient care, assessment, plan of care, and follow-up.

## 2018-08-09 NOTE — Progress Notes (Signed)
Theodore King was seen today in the movement disorders clinic for neurologic consultation at the request of [name], MD.  The consultation is for the evaluation of PD.  Pt currently under the care of Dr. [name].  The records that were made available to me were reviewed.  Pts girlfriend in the waiting room but he refused to allow her in (I asked few times).  His first symptom was left greater than right upper extremity tremor in 2012.  It started after a tear in the rotator cuff, which he thought was the initial cause.  Around that time, he also developed shuffling gait.  It appears that he started levodopa around that time and was not sure that it was beneficial.  In 2013, he started on pramipexole and did think it was beneficial for tremor.  He did notice, however, that with the course of time when he increased levodopa that it did seem to be helpful.  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  He last saw Dr. [name] in June, 2018.  He has been seen at the emergency room several times since then.  He went to the emergency room on December 2 with complaints of leg spasm and increasing tremors.  He admitted he had not taken his Parkinson's medication the day he was seen in the emergency room (seen around noon).  He had also been given baclofen by his primary care physician, but has not picked that up from the pharmacy.  The biggest issue, according to the emergency room, was noncompliance with medication (also noted in Dr. Gladstone Lighter notes).  Once given medication in the emergency room he felt better.  He was discharged to [facility].  He came back to the emergency room on June 25, 2017 for similar complaints of generalized weakness.  No weakness was found on examination and he was discharged home.  He was given a walker.  Pt reports that he is on carbidopa/levodopa 25/100, 1 three times per day.  He is supposed to be on 2 po tid.  He doesn't know other  meds as daughter takes care of them.  Pramipexole and azilect are listed.    Specific Symptoms:  Tremor: Yes.   Family hx of similar:  No. Voice: "I have a tremor in my voice." Sleep: trouble getting to sleep  Vivid Dreams:  Yes.   - "Its like I see things"  Acting out dreams:  No. Wet Pillows: Yes.   Postural symptoms:  Yes.    Falls?  Yes.  , states that he has had 2 falls.  Last fall was about 2 weeks ago.  He was bending over to put on shoes and he kept going.  Didn't get hurt Bradykinesia symptoms: shuffling gait, slow movements and difficulty getting out of a chair Loss of smell:  No. Loss of taste:  No. Urinary Incontinence:  yes but has urinary urgency and occasionally will wear depends if goes out of town Difficulty Swallowing:  No. Handwriting, micrographia: Yes.   Trouble with ADL's:  No.  Trouble buttoning clothing: No. Depression:  No., but "I get angry at myself" Memory changes:  No. - patient denies memory change.  Daughter (23y/o) lives with the patient.  Has a girlfriend.  Puts med in pillbox and girlfriend prepares it.  Daughter does grocery shopping and pays bills.  Pt does not "but I haven't gotten my license back yet."  He cannot tell me why he lost it but may  be from memory. Hallucinations:  Yes (initially denies and then says "I see people but I know there are not people."  visual distortions: yes - "I think something is crawling on the floor and then I realize nothing is there" N/V:  No. Lightheaded:  No.  Syncope: No. Diplopia:  Yes.   Dyskinesia:  No.  Neuroimaging of the brain has previously been performed.  It is available for my review today.  Had MRI brain in 2013 and it showed rare T2 hyperintensity.    09/29/17 update: Patient was worked in today for a follow-up.  He is seen sooner than expected.  He is accompanied by his girlfriend who supplements the history.  He is also on Azilect.  Nuplazid was started last visit.  There has been much confusion over  this medication and he/his daughter were both advised to call [facility] and we have given the many samples.  I received a call at the end of March complaining about more difficulty walking and episodes of freezing.  Confusion about what he was actually taking and how it was prescribed persisted.  Ultimately, it turns out that the patient was still on pramipexole 1 mg, 2 tablets twice per day.  I decreased that to 1 tablet twice a day for a week and then once per day for a week and then discontinued that medication. He is off of that and has been for 2 weeks.   At the same time, I increased his carbidopa/levodopa 25/1 100 to 2 tablets in the morning, 2 in the afternoon (having trouble remembering that and taking it at 2-3pm instead of 11am-12pm)  and 1 in the evening.  Girlfriend reports that he couldn't even get dressed on his own this AM but now that medication has kicked in he can walk normally.  He has trouble at night and she has to lift his legs in the bed.  Sounds like he has wearing off of medication.  He went into a restaurant bathroom and then could not get out of the restaurant bathroom and she did not know what to do since it was a men's bathroom.  She calls these "spells."  Reports that he is taking baclofen 4 times per day, 20 mg each.  Been on that for "at least 2-3 months."  He is having hallucination once a day or once every 2 days.  He never mentions it to his girlfriend.  He recently had a hallucination when he looked out the window and thought someone was outside taking something apart.  He is very active at night.  Supposed to be on clonazepam, but really has not been taking it.  He is screaming at night and jerking.  Lives with daughter who is at school but stays with girlfriend 3-4 days per week.     He was seen in the emergency room on September 19, 2017.  Those records are reviewed.  He was seen for leg pain and swelling.  Ultrasound was negative for DVT and he was discharged home.    10/13/17  update: The patient is seen today in follow-up.  His girlfriend drove him here but refused to come back to the room when asked by staff members.   He is on carbidopa/levodopa 25/100, 2 tablets at 7 AM/2 tablets at 11 AM and 1 tablet at 4 PM and last visit we added carbidopa/levodopa 50/200 at bedtime.  It is difficult to assess efficacy of this one.   He does state that the alarm  that we sit on his phone last visit has really helped him remember taking his medication.  I asked the patient last visit to check and see whether or not he was on pramipexole, 2 mg twice daily. He brings a med list in today and he is not on that.  He is still on Azilect.  We added very low-dose clonazepam last visit, 0.25 mg, for REM behavior disorder.  "I believe the dreams are gone."  He had one fall since our last visit where he tripped up step.  Didn't get hurt.  He states that hallucinations are gone (baclofen d/c at our last visit but he brings that bottle in today and he is not sure if he is on it).  He still has occasional freezing but not bad per patient.  He asks me about the "new medication" we discussed last visit.   He is going to [facility] 2 days per week.  States that he got in shoulder yesterday and feels better.    01/16/18 update: Patient follows up today for Parkinson's disease and dementia.  Pts accompanied by girlfriend who supplements the hx.  He is supposed to be on carbidopa/levodopa 25/100, 2 tablets at 7 AM, 2 tablets at 11 AM, 1 tablet at 4 PM and carbidopa/levodopa 50/200 at bedtime.  However, girlfriend reports that pharmacy wouldn't give her the IR (pharmacy not open so didn't call) and so she is instead giving him the carbidopa/levodopa 50/200 at 7am/11am/4pm.  This has been so for about a week.  He remains on Azilect as well.  In regards REM behavior disorder, low-dose clonazepam, 0.25 mg, has helped.  He ran out of this about 2 nights ago per girlfriend. Records have been reviewed since our last visit.  There  have been several calls/correspondences with his family.  He has also been in the emergency room in July and in August.  In July, patient was in the emergency room for confusion and slurred speech.  MRI of the brain was obtained on December 12, 2017 and was unremarkable.  Patient was in the emergency room again on January 09, 2017 with generalized weakness.  Family was hoping for nursing placement for rehab.  Social work at the hospital investigated and stated that his insurance would not pay for rehab.  Zanaflex was given for him for muscle spasms. They report he is not taking the zanaflex.   However, when he describes it, he is actually having freezing of the right leg.  Having trouble turning over in the bed at night and getting out of chairs.  No hallucinations.  On nuplazid.    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  He is accompanied by his girlfriend who supplements the history.  He was worked in today.  He and his family walked into the office 2 days ago and stated that he was worse and wanted to be worked in, and this was the appointment we were able to give him.  He is on carbidopa/levodopa 25/100, 2 tablets at 7 AM/2 tablets at 11 AM/1 tablet at 4 PM and carbidopa/levodopa 50/200 at bed time. He describes trouble with first morning on.  Girlfriend describes trouble with him getting in and out of bed.  He has an alarm set on the phone to remind him to take it and that system works well.   I stopped his Azilect and Zanaflex last visit.  He is on clonazepam 0.25 mg at  bedtime.  Records are reviewed since our last visit.  The patient has been in the emergency room several times.  He was there on September 10 with leg pain and was given Flexeril.  Reports he is not taking that.  He was there on October 5, also with leg pain.  Work-up was negative and he was sent home that day.  They called here after that visit and we told them to make  sure that he was taking medications properly because if not, this could cause leg cramping, which was our suspicion.  He was back in the emergency room on October 22 with chest pain, which was felt related to reflux.  Doing well with hallucinations with nuplazid.  05/23/18 update: Patient is seen today for follow-up for Parkinson's disease and Parkinson's related dementia.  He is accompanied by his girlfriend who supplements the history.  His medication was increased last visit so that he is on carbidopa/levodopa 25/100, 2 tablets at 7 AM, 2 tablets at 10 AM, 2 tablets at 2 PM and 1 tablet at 6 PM.  He is also on carbidopa/levodopa 50/200 at bedtime. They don't know what he is taking.  They bring a bottle but it isn't the same instructions as he is supposed to be on (its an old bottle).  They think they are still taking medication at 2 at 7am/2 at 11am/1 at 4pm.   He is supposed to be on clonazepam, 0.25 mg at bedtime but they have stopped that. States that he hasn't really been having dreams.  He is also on nuplazid for hallucinations.  He is free of hallucinations - "that has been straighted out."  Girlfriend describes freezing in the day and trouble with first morning on.  Physical therapy and social work was sent to the home after our last visit. Pt states that PT only came 4 times to the home. No falls.  Dr. Mancel Bale just started patient on flomax.  Just started last night.  Has f/u appt in 2 weeks with pcp.   08/11/18 update: Patient is seen today in follow-up for Parkinson's disease.  He is accompanied by his girlfriend who supplements history.  Girlfriend states that he is getting worse but she cannot tell me how.  Girlfriend states that "when I picked you up yesterday, you weren't doing good."   Patient is on carbidopa/levodopa 25/100, 2 tablets at 7 AM, 2 tablets at 10 AM 2 tablets at 2 PM and 1 tablet at 6 PM, in addition to carbidopa/levodopa 50/200 at bedtime.  He is on nuplazid.  He has not been  having hallucinations.  Girlfriend asks about going back on azilect - "I read it slows down the disease."  She asks about freezing and adding other medications.  States that "I would rather have him hallucinating than freezing."   Records are reviewed since last visit.  Patient was seen in the emergency room on June 24, 2018 with complaints of shortness of breath.  Work-up was unremarkable with the exception of UTI and he was placed on Levaquin for that.  His urinalysis did not reveal nitrites and there is only trace leukocytes.  There was no culture done.  PREVIOUS MEDICATIONS: Sinemet; azilect; zanaflex; pramipexole  ALLERGIES:  No Known Allergies  CURRENT MEDICATIONS:  Outpatient Encounter Medications as of 08/11/2018  Medication Sig  . carbidopa-levodopa (SINEMET CR) 50-200 MG tablet Take 1 tablet by mouth at bedtime.  . carbidopa-levodopa (SINEMET IR) 25-100 MG tablet 2 tablets at 7AM, 2 tablets  at 10AM, 2 tablets at [facility], 1 tablet at 6pm (Patient taking differently: Take 1-2 tablets by mouth See admin instructions. 2 tablets at 7AM, 2 tablets at 10AM, 2 tablets at 2PM, 1 tablet at 6pm)  . ibuprofen (ADVIL,MOTRIN) 400 MG tablet Take 400 mg by mouth every 6 (six) hours as needed.  . metFORMIN (GLUCOPHAGE) 500 MG tablet Take 500 mg by mouth daily with breakfast.   . Pimavanserin Tartrate (NUPLAZID) 34 MG CAPS Take 34 mg by mouth daily.  . [DISCONTINUED] albuterol (PROVENTIL HFA;VENTOLIN HFA) 108 (90 BASE) MCG/ACT inhaler Inhale 2 puffs into the lungs every 6 (six) hours as needed for wheezing or shortness of breath.  . [DISCONTINUED] Aspirin-Acetaminophen-Caffeine (GOODY HEADACHE PO) Take 1 packet by mouth as needed (for headaches or pain).  . [DISCONTINUED] levofloxacin (LEVAQUIN) 500 MG tablet Take 500 mg by mouth daily. For 7 days Started on 06-23-17  . [DISCONTINUED] naproxen sodium (ALEVE) 220 MG tablet Take 440 mg by mouth daily as needed (pain).   . [DISCONTINUED] tamsulosin (FLOMAX) 0.4  MG CAPS capsule Take 0.4 mg by mouth daily.   No facility-administered encounter medications on file as of 08/11/2018.     PAST MEDICAL HISTORY:   Past Medical History:  Diagnosis Date  . Abnormal dreams 09/06/2013  . Disorders of bursae and tendons in shoulder region, unspecified   . Hyperglycemia 05/18/2013  . Hyperlipidemia LDL goal < 100 05/18/2013  . Other and unspecified hyperlipidemia   . Other and unspecified hyperlipidemia   . 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 Paralysis agitans ([place]) 05/18/2013  . Parkinson's disease ([place])   . 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 SOB (shortness of breath) 03/23/2018  . Unspecified vitamin D deficiency   . Unspecified vitamin D deficiency   . Vertigo, benign paroxysmal 11/05/2016  . Vitamin D deficiency 05/18/2013    PAST SURGICAL HISTORY:   Past Surgical History:  Procedure Laterality Date  . APPENDECTOMY    . ROTATOR CUFF REPAIR     right    SOCIAL HISTORY:   Social History   Socioeconomic History  . Marital status: Legally Separated    Spouse name: Not on file  . Number of children: 3  . Years of education: 12th  . Highest education level: Not on file  Occupational History    Employer: RETIRED  Social Needs  . Financial resource strain: Not on file  . Food insecurity:    Worry: Not on file    Inability: Not on file  . Transportation needs:    Medical: Not on file    Non-medical: Not on file  Tobacco Use  . Smoking status: Former Smoker    Packs/day: 0.50    Years: 20.00    Pack years: 10.00    Last attempt to quit: 05/23/1974    Years since quitting: 44.2  . Smokeless tobacco: Never Used  Substance and Sexual Activity  . Alcohol use: Not Currently  . Drug use: No  . Sexual activity: Never  Lifestyle  . Physical activity:    Days per week: Not on file    Minutes per session: Not on file  . Stress: Not on file  Relationships  . Social connections:    Talks on phone: Not on file    Gets  together: Not on file    Attends religious service: Not on file    Active member of club or organization: Not on file    Attends meetings of clubs or organizations:  Not on file    Relationship status: Not on file  . Intimate partner violence:    Fear of current or ex partner: Not on file    Emotionally abused: Not on file    Physically abused: Not on file    Forced sexual activity: Not on file  Other Topics Concern  . Not on file  Social History Narrative  . Not on file    FAMILY HISTORY:   Family Status  Relation Name Status  . Mother  Deceased  . Father  Deceased  . Brother Collins [place] Deceased  . Sister Kyung Bacca Deceased  . Daughter [employer]  . Son Tessa Lerner  . Sister Hulda Marin Deceased  . Brother [employer]  . Brother [employer]  . Brother [employer]  . Brother [employer]  . Brother [employer]  . Brother [employer]  . Daughter [name] Alive  . Neg Hx  (Not Specified)    ROS: ROS  PHYSICAL EXAMINATION:    VITALS:   Vitals:   08/11/18 0852  BP: 140/66  Pulse: 68  SpO2: 96%  Weight: 213 lb (96.6 kg)  Height: 5' 11.5" (1.816 m)     Orientation: The patient is alert and oriented to person and place. Montreal Cognitive Assessment  07/19/2017  Visuospatial/ Executive (0/5) 1  Naming (0/3) 2  Attention: Read list of digits (0/2) 1  Attention: Read list of letters (0/1) 0  Attention: Serial 7 subtraction starting at 100 (0/3) 1  Language: Repeat phrase (0/2) 2  Language : Fluency (0/1) 1  Abstraction (0/2) 0  Delayed Recall (0/5) 0  Orientation (0/6) 4  Total 12  Adjusted Score (based on education) 13   GEN:  The patient appears stated age and is in NAD. HEENT:  Normocephalic, atraumatic.  The mucous membranes are moist. The superficial temporal arteries are without ropiness or tenderness. CV:  RRR Lungs:  CTAB Neck/HEME:  There are no carotid bruits bilaterally.  Neurological examination:  Orientation: The patient is alert and  oriented x3 today Cranial nerves: There is good facial symmetry. The speech is fluent and clear. Soft palate rises symmetrically and there is no tongue deviation. Hearing is intact to conversational tone. Sensation: Sensation is intact to light touch throughout Motor: Strength is 5/5 in the bilateral upper and lower extremities.   Shoulder shrug is equal and symmetric.  There is no pronator drift.  Movement examination: Tone: There is normal tone in the upper and lower extremities (same as prior) Abnormal movements: No tremor. Coordination:  There is no decremation, with any form of RAMS, including alternating supination and pronation of the forearm, hand opening and closing, finger taps, heel taps and toe taps (same as prior) Gait and Station: The patient pushes off of the chair to arise.  He ambulates well down the hall.  There is no shuffling.  He is not short stepped.   ASSESSMENT/PLAN:  1.  Idiopathic Parkinson's disease, by history  -Patient will continue with carbidopa/levodopa 25/100, 2 tablets at 7 AM, 2 tablets at 10 AM 2 tablets at 2 PM and 1 tablet at 6 PM, in addition to carbidopa/levodopa 50/200 at bedtime.  Girlfriend c/o off times, but didn't see that today.  Will cautiously add comtan, 200 mg with first 3 dosages of carbidopa/levodopa 25/100.  Risks, benefits, side effects and alternative therapies were discussed.  The opportunity to ask questions was given and they were answered to the best of my ability.  The patient  expressed understanding and willingness to follow the outlined treatment protocols.  -girlfriend had many questions and answered them to best of my ability   2.  PDD with hallucinations  -Nuplazid resolved his hallucinations.  He is free of hallucinations.  His girlfriend wanted me to add meds that increased hallucinations and felt that it was more important to have control of freezing (which is often not med responsive anyway) than mental control.  I explained  compromise of mental faculties is never acceptable.  He is doing very well right now and actually looked really well from a physical and mental status today.  3.  REM behavior disorder  -He was on low-dose clonazepam, 0.25 mg.  They have stopped that.  At this point, I did not restarted.  We will just need to watch him closely.  4.  Leg pain  -unrelated to PD.  Sees Dr. Lynann Bologna and has had injections.  5.  Urinary urgency/frequency/incontinence  -had just started tamsulosin last visit but they don't recall this medication at all.  They would like me to refer to urology.  Will refer.  6.  Follow up is anticipated in the next few months, sooner should new neurologic issues arise.  Much greater than 50% of this visit was spent in counseling and coordinating care.  Total face to face time:  30 min   Cc:  [name], MD

## 2018-08-11 ENCOUNTER — Telehealth: Payer: Self-pay | Admitting: Neurology

## 2018-08-11 ENCOUNTER — Ambulatory Visit: Payer: Medicare HMO | Admitting: Neurology

## 2018-08-11 ENCOUNTER — Encounter

## 2018-08-11 ENCOUNTER — Encounter: Payer: Self-pay | Admitting: Neurology

## 2018-08-11 VITALS — BP 140/66 | HR 68 | Ht 71.5 in | Wt 213.0 lb

## 2018-08-11 DIAGNOSIS — R32 Unspecified urinary incontinence: Secondary | ICD-10-CM | POA: Diagnosis not present

## 2018-08-11 DIAGNOSIS — G20 Parkinson's disease: Secondary | ICD-10-CM

## 2018-08-11 DIAGNOSIS — F0280 Dementia in other diseases classified elsewhere without behavioral disturbance: Secondary | ICD-10-CM | POA: Diagnosis not present

## 2018-08-11 DIAGNOSIS — R3915 Urgency of urination: Secondary | ICD-10-CM

## 2018-08-11 MED ORDER — ENTACAPONE 200 MG PO TABS: 200.0000 mg | ORAL_TABLET | Freq: Three times a day (TID) | ORAL | 1 refills | Status: DC

## 2018-08-11 NOTE — Telephone Encounter (Signed)
Patient's daughter is wanting to speak with you or Dr.Tat about his parkinsons. She has some questions and concerns. Please call her. Thanks!

## 2018-08-11 NOTE — Addendum Note (Signed)
Addended byAnnamaria Helling on: 08/11/2018 10:58 AM   Modules accepted: Orders

## 2018-08-11 NOTE — Telephone Encounter (Signed)
Called daughter - she couldn't talk a lot right now. I let her know about Comtan that was prescribed and what that was for. Also aware we referred to Urology. Referral faxed to [facility] at [phone] with confirmation received. They will call them to schedule this appt. She wants me to call her back next week to discuss patient further. Reminder made.

## 2018-08-11 NOTE — Patient Instructions (Addendum)
Add entacapone - 200 mg - 1 tablet at 7am, 10 am, 2pm.  It may turn your urine orange.

## 2018-08-14 NOTE — Progress Notes (Signed)
@Patient  ID: Theodore King, male    DOB: 11/15/1945, 73 y.o.   MRN: [id number]  Chief Complaint  Patient presents with  . Follow-up    DOE & PFT    Referring provider: [name], MD  HPI:  73 year old male former smoker initially referred to our office on 05/23/2018 for evaluation of shortness of breath and dyspnea on exertion.  PMH: Parkinson's, Parkinson's related dementia (managed by Dr. [facility]) Smoker/ Smoking History: Former smoker.  Quit 1975.  10-pack-year smoking history with a pack lasting a week. Maintenance:  None  Pt of: Dr. Loanne Drilling  08/15/2018  - Visit   73 year old male initially referred to our office on 05/23/2018 for evaluation of shortness of breath and dyspnea on exertion.  Patient presenting today after completing pulmonary function testing pulmonary function test results listed below:  08/15/2018-pulmonary function test-FVC 2.98 (79% predicted), postbronchodilator ratio 61, postbronchodilator FEV1 1.60 (57% predicted), no bronchodilator response, DLCO 95 >>> Inspiratory boxlike shape on PFTs  Patient denies any issues swallowing, patient does admit that he routinely hears a wheeze this comes and goes throughout the day.  Patient's only complaint today is his continued dyspnea.  MMRC - Breathlessness Score 2 - on level ground, I walk slower than people of the same age because of breathlessness, or have to stop for breathe when walking to my own pace     Tests:   06/24/2018-chest x-ray-no active cardiopulmonary disease  08/15/2018-pulmonary function test-FVC 2.98 (79% predicted), postbronchodilator ratio 61, postbronchodilator FEV1 1.60 (57% predicted), no bronchodilator response, DLCO 95 >>> Inspiratory boxlike shape on PFTs  FENO:  No results found for: NITRICOXIDE  PFT: PFT Results Latest Ref Rng & Units 08/15/2018  FVC-Pre L 2.98  FVC-Predicted Pre % 79  FVC-Post L 2.60  FVC-Predicted Post % 69  Pre FEV1/FVC % % 68  Post  FEV1/FCV % % 61  FEV1-Pre L 2.02  FEV1-Predicted Pre % 72  FEV1-Post L 1.60  DLCO UNC% % 95  DLCO COR %Predicted % 114  TLC L 5.36  TLC % Predicted % 76  RV % Predicted % 82    Imaging: No results found.    Specialty Problems      Pulmonary Problems   SOB (shortness of breath)   Abnormal pulmonary function test    08/15/2018-pulmonary function test-FVC 2.98 (79% predicted), postbronchodilator ratio 61, postbronchodilator FEV1 1.60 (57% predicted), no bronchodilator response, DLCO 95 >>> Inspiratory boxlike shape on PFTs      Mixed restrictive and obstructive lung disease ([address] [address])    08/15/2018-pulmonary function test-FVC 2.98 (79% predicted), postbronchodilator ratio 61, postbronchodilator FEV1 1.60 (57% predicted), no bronchodilator response, DLCO 95 >>> Inspiratory boxlike shape on PFTs         No Known Allergies  Immunization History  Administered Date(s) Administered  . Influenza Whole 03/07/2010, 03/29/2013  . Influenza, High Dose Seasonal PF 04/23/2018  . Influenza-Unspecified 02/10/2014  . PPD Test 10/20/2016  . Pneumococcal Polysaccharide-23 07/12/2011  . Tdap 07/12/2011    Past Medical History:  Diagnosis Date  . Abnormal dreams 09/06/2013  . Disorders of bursae and tendons in shoulder region, unspecified   . Hyperglycemia 05/18/2013  . Hyperlipidemia LDL goal < 100 05/18/2013  . Other and unspecified hyperlipidemia   . Other and unspecified hyperlipidemia   . 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 Paralysis agitans ([place]) 05/18/2013  . Parkinson's disease ([place])   . 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 SOB (shortness of breath) 03/23/2018  .  Unspecified vitamin D deficiency   . Unspecified vitamin D deficiency   . Vertigo, benign paroxysmal 11/05/2016  . Vitamin D deficiency 05/18/2013    Tobacco History: Social History   Tobacco Use  Smoking Status Former Smoker  . Packs/day: 0.50  . Years: 20.00  . Pack years: 10.00  . Start  date: 08/14/1953  . Last attempt to quit: 05/23/1974  . Years since quitting: 44.2  Smokeless Tobacco Never Used   Counseling given: Not Answered  Continue to not smoke   Outpatient Encounter Medications as of 08/15/2018  Medication Sig  . carbidopa-levodopa (SINEMET CR) 50-200 MG tablet Take 1 tablet by mouth at bedtime.  . carbidopa-levodopa (SINEMET IR) 25-100 MG tablet 2 tablets at 7AM, 2 tablets at 10AM, 2 tablets at 2PM, 1 tablet at 6pm (Patient taking differently: Take 1-2 tablets by mouth See admin instructions. 2 tablets at 7AM, 2 tablets at 10AM, 2 tablets at 2PM, 1 tablet at 6pm)  . entacapone (COMTAN) 200 MG tablet Take 1 tablet (200 mg total) by mouth 3 (three) times daily. Take 1 at 7am/10am/2pm  . ibuprofen (ADVIL,MOTRIN) 400 MG tablet Take 400 mg by mouth every 6 (six) hours as needed.  . metFORMIN (GLUCOPHAGE) 500 MG tablet Take 500 mg by mouth daily with breakfast.   . Pimavanserin Tartrate (NUPLAZID) 34 MG CAPS Take 34 mg by mouth daily.   No facility-administered encounter medications on file as of 08/15/2018.      Review of Systems  Review of Systems  Constitutional: Positive for fatigue. Negative for activity change, chills, fever and unexpected weight change.  HENT: Negative for postnasal drip, rhinorrhea, sinus pressure, sinus pain, sneezing and sore throat.   Eyes: Negative.   Respiratory: Positive for cough (dry cough ), shortness of breath and wheezing (varies, goes and comes).   Cardiovascular: Negative for chest pain and palpitations.  Gastrointestinal: Negative for constipation, diarrhea, nausea and vomiting.  Endocrine: Negative.   Musculoskeletal: Negative.   Skin: Negative.   Neurological: Positive for tremors (Occasional) and weakness.       Known diagnosis of Parkinson's  Psychiatric/Behavioral: Negative.  Negative for dysphoric mood. The patient is not nervous/anxious.   All other systems reviewed and are negative.    Physical Exam  BP (!)  118/58 (BP Location: Left Arm, Cuff Size: Normal)   Pulse 65   Ht 5\' 10"  (1.778 m)   Wt 210 lb (95.3 kg)   SpO2 98%   BMI 30.13 kg/m   Wt Readings from Last 5 Encounters:  08/15/18 210 lb (95.3 kg)  08/11/18 213 lb (96.6 kg)  06/24/18 226 lb (102.5 kg)  05/23/18 212 lb 6.4 oz (96.3 kg)  05/23/18 213 lb (96.6 kg)   Physical Exam  Constitutional: He is oriented to person, place, and time and well-developed, well-nourished, and in no distress. No distress.  HENT:  Head: Normocephalic and atraumatic.  Right Ear: Hearing, tympanic membrane, external ear and ear canal normal.  Left Ear: Hearing, tympanic membrane, external ear and ear canal normal.  Nose: Mucosal edema and rhinorrhea present. Right sinus exhibits no maxillary sinus tenderness and no frontal sinus tenderness. Left sinus exhibits no maxillary sinus tenderness and no frontal sinus tenderness.  Mouth/Throat: Uvula is midline and oropharynx is clear and moist. Abnormal dentition. No oropharyngeal exudate or tonsillar abscesses.  Eyes: Pupils are equal, round, and reactive to light.  Neck: Trachea normal and normal range of motion. Neck supple. Carotid bruit is not present. No tracheal deviation present. No  thyroid mass and no thyromegaly present.  Questionable upper airway wheeze  Cardiovascular: Normal rate, regular rhythm and normal heart sounds.  Pulmonary/Chest: Effort normal and breath sounds normal. No accessory muscle usage. No respiratory distress. He has no decreased breath sounds. He has no wheezes. He has no rhonchi. He has no rales.  Musculoskeletal: Normal range of motion.        General: Edema (Trace lower extremity edema bilaterally) present.  Lymphadenopathy:    He has no cervical adenopathy.  Neurological: He is alert and oriented to person, place, and time. Gait normal.  Skin: Skin is warm and dry. He is not diaphoretic. No erythema.  Psychiatric: Mood, memory, affect and judgment normal.  Nursing note and  vitals reviewed.    Lab Results:  CBC    Component Value Date/Time   WBC 6.1 06/24/2018 1836   RBC 5.16 06/24/2018 1836   HGB 14.3 06/24/2018 1836   HCT 44.6 06/24/2018 1836   PLT 121 (L) 06/24/2018 1836   MCV 86.4 06/24/2018 1836   MCH 27.7 06/24/2018 1836   MCHC 32.1 06/24/2018 1836   RDW 13.2 06/24/2018 1836   LYMPHSABS 1.6 06/24/2018 1836   MONOABS 0.4 06/24/2018 1836   EOSABS 0.1 06/24/2018 1836   BASOSABS 0.0 06/24/2018 1836    BMET    Component Value Date/Time   NA 136 06/24/2018 1836   NA 141 10/22/2016   K 4.3 06/24/2018 1836   CL 103 06/24/2018 1836   CO2 23 06/24/2018 1836   GLUCOSE 106 (H) 06/24/2018 1836   BUN 13 06/24/2018 1836   BUN 14 10/22/2016   CREATININE 1.05 06/24/2018 1836   CALCIUM 9.2 06/24/2018 1836   GFRNONAA >60 06/24/2018 1836   GFRAA >60 06/24/2018 1836    BNP    Component Value Date/Time   BNP 23.0 06/24/2018 1836    ProBNP No results found for: PROBNP    Assessment & Plan:     Mixed restrictive and obstructive lung disease ([place]) Assessment: mMRC 2 today Known diagnosis of Parkinson's March/2020 pulmonary function test shows restriction as well as persistent obstruction Limited smoking history 10 pack years Inspiratory boxlike shape on PFTs Lungs clear to auscultation today  Plan: Trial of Stiolto Respimat inhaler today Referral to ENT for evaluation of inspiratory boxlike pattern >>> This could also be likely related to Parkinson's could be a vocal cord tremor Due to restrictive pattern seen on PFT could consider MVV with next pulmonary function testing Follow-up with Dr. Loanne Drilling in 2 months Keep follow-up with neurology   Parkinson's disease Plan: Continue follow-up with neurology Continue medications as prescribed by neurology  Former smoker Assessment: Quit 1975 10-pack-year smoking history  Plan:  Continue to not smoke   Abnormal pulmonary function test Assessment: Inspiratory boxlike shape on  PFTs Questionable upper airway wheeze on exam today minor at best Neck exam is benign for masses Patient reports no difficulty swallowing  Plan: We will refer to ENT for evaluation for vocal cord tremors     Lauraine Rinne, NP 08/15/2018   This appointment was 32 min long with over 50% of the time in direct face-to-face patient care, assessment, plan of care, and follow-up.

## 2018-08-15 ENCOUNTER — Encounter: Payer: Self-pay | Admitting: Pulmonary Disease

## 2018-08-15 ENCOUNTER — Ambulatory Visit (INDEPENDENT_AMBULATORY_CARE_PROVIDER_SITE_OTHER): Payer: Medicare HMO | Admitting: Pulmonary Disease

## 2018-08-15 VITALS — BP 118/58 | HR 65 | Ht 70.0 in | Wt 210.0 lb

## 2018-08-15 DIAGNOSIS — G20 Parkinson's disease: Secondary | ICD-10-CM

## 2018-08-15 DIAGNOSIS — J984 Other disorders of lung: Secondary | ICD-10-CM

## 2018-08-15 DIAGNOSIS — R0609 Other forms of dyspnea: Secondary | ICD-10-CM

## 2018-08-15 DIAGNOSIS — R942 Abnormal results of pulmonary function studies: Secondary | ICD-10-CM

## 2018-08-15 DIAGNOSIS — J439 Emphysema, unspecified: Secondary | ICD-10-CM | POA: Diagnosis not present

## 2018-08-15 DIAGNOSIS — Z87891 Personal history of nicotine dependence: Secondary | ICD-10-CM | POA: Insufficient documentation

## 2018-08-15 LAB — PULMONARY FUNCTION TEST
DL/VA % pred: 114 %
DL/VA: 4.57 ml/min/mmHg/L
DLCO cor % pred: 96 %
DLCO cor: 24.31 ml/min/mmHg
DLCO unc % pred: 95 %
DLCO unc: 24.1 ml/min/mmHg
FEF 25-75 Post: 0.4 L/sec
FEF 25-75 Pre: 1.44 L/sec
FEF2575-%Change-Post: -72 %
FEF2575-%Pred-Post: 17 %
FEF2575-%Pred-Pre: 62 %
FEV1-%Change-Post: -20 %
FEV1-%Pred-Post: 57 %
FEV1-%Pred-Pre: 72 %
FEV1-Post: 1.6 L
FEV1-Pre: 2.02 L
FEV1FVC-%Change-Post: -9 %
FEV1FVC-%Pred-Pre: 89 %
FEV6-%Change-Post: -11 %
FEV6-%Pred-Post: 69 %
FEV6-%Pred-Pre: 78 %
FEV6-Post: 2.49 L
FEV6-Pre: 2.81 L
FEV6FVC-%Change-Post: -3 %
FEV6FVC-%Pred-Post: 100 %
FEV6FVC-%Pred-Pre: 104 %
FVC-%Change-Post: -12 %
FVC-%Pred-Post: 69 %
FVC-%Pred-Pre: 79 %
FVC-Post: 2.6 L
FVC-Pre: 2.98 L
Post FEV1/FVC ratio: 61 %
Post FEV6/FVC ratio: 96 %
Pre FEV1/FVC ratio: 68 %
Pre FEV6/FVC Ratio: 99 %
RV % pred: 82 %
RV: 2.08 L
TLC % pred: 76 %
TLC: 5.36 L

## 2018-08-15 NOTE — Progress Notes (Signed)
PFT done today. 

## 2018-08-15 NOTE — Assessment & Plan Note (Addendum)
Assessment: mMRC 2 today Known diagnosis of Parkinson's March/2020 pulmonary function test shows restriction as well as persistent obstruction Limited smoking history 10 pack years Inspiratory boxlike shape on PFTs Lungs clear to auscultation today  Plan: Trial of Stiolto Respimat inhaler today Referral to ENT for evaluation of inspiratory boxlike pattern >>> This could also be likely related to Parkinson's could be a vocal cord tremor Due to restrictive pattern seen on PFT could consider MVV with next pulmonary function testing Follow-up with Dr. Loanne Drilling in 2 months Keep follow-up with neurology

## 2018-08-15 NOTE — Patient Instructions (Addendum)
Trial of Stiolto Respimat inhaler >>>2 puffs daily >>>Take this no matter what >>>This is not a rescue inhaler >>>sample provided today  >>>Call on 08/22/2018 and let us know how you are doing, if you tolerate this medication well we can send in a prescription   Referral to ENT   Follow up with Dr. Loanne Drilling in 8 weeks    It is flu season:   >>> Best ways to protect herself from the flu: Receive the yearly flu vaccine, practice good hand hygiene washing with soap and also using hand sanitizer when available, eat a nutritious meals, get adequate rest, hydrate appropriately   Please contact the office if your symptoms worsen or you have concerns that you are not improving.   Thank you for choosing [facility] for your healthcare, and for allowing [place] to partner with you on your healthcare journey. I am thankful to be able to provide care to you today.   Wyn Quaker FNP-C

## 2018-08-15 NOTE — Progress Notes (Signed)
Patient seen in the office today and instructed on use of Stiolto Respimat.  Patient expressed understanding and demonstrated technique.  

## 2018-08-15 NOTE — Assessment & Plan Note (Signed)
Assessment: Quit 1975 10-pack-year smoking history  Plan:  Continue to not smoke

## 2018-08-15 NOTE — Assessment & Plan Note (Signed)
Assessment: Inspiratory boxlike shape on PFTs Questionable upper airway wheeze on exam today minor at best Neck exam is benign for masses Patient reports no difficulty swallowing  Plan: We will refer to ENT for evaluation for vocal cord tremors

## 2018-08-15 NOTE — Assessment & Plan Note (Signed)
Plan: Continue follow-up with neurology Continue medications as prescribed by neurology

## 2018-08-26 ENCOUNTER — Other Ambulatory Visit: Payer: Self-pay | Admitting: Neurology

## 2018-08-26 DIAGNOSIS — G20 Parkinson's disease: Secondary | ICD-10-CM

## 2018-08-26 DIAGNOSIS — F060 Psychotic disorder with hallucinations due to known physiological condition: Secondary | ICD-10-CM

## 2018-10-13 ENCOUNTER — Ambulatory Visit: Payer: Self-pay | Admitting: Neurology

## 2018-10-16 DIAGNOSIS — R490 Dysphonia: Secondary | ICD-10-CM | POA: Insufficient documentation

## 2018-10-17 ENCOUNTER — Ambulatory Visit: Payer: Self-pay | Admitting: Pulmonary Disease

## 2018-11-06 ENCOUNTER — Telehealth: Payer: Self-pay | Admitting: Neurology

## 2018-11-06 ENCOUNTER — Other Ambulatory Visit: Payer: Self-pay | Admitting: Neurology

## 2018-11-06 NOTE — Telephone Encounter (Signed)
Patient's friend called in for him. He is needing his Entacapone called in for him. She said he uses Product/process development scientist at [facility]. She said that this time he didn't have a refill at that [facility], it was through the mail and he didn't want to get it through the mail order. He still has not received the medication. Please Call. Thanks

## 2018-11-06 NOTE — Telephone Encounter (Signed)
Requested Prescriptions   Pending Prescriptions Disp Refills  . entacapone (COMTAN) 200 MG tablet [Pharmacy Med Name: Entacapone 200 MG Oral Tablet] 270 tablet 0    Sig: TAKE 1 TABLET BY MOUTH THREE TIMES DAILY TAKE  1  AT  7AM,  10AM,  2PM.   Rx last filled:08/11/18 #270 1 refills   Pt last seen:08/11/18  Follow up appt scheduled:01/15/19

## 2018-11-06 NOTE — Telephone Encounter (Signed)
Requested Prescriptions   Pending Prescriptions Disp Refills  . entacapone (COMTAN) 200 MG tablet 270 tablet 1    Sig: Take 1 tablet (200 mg total) by mouth 3 (three) times daily. Take 1 at 7am/10am/2pm   Rx last filled:08/11/18 #270 1 refill  Pt last seen: 08/11/18  Follow up appt scheduled:01/15/19  Pt should have refill at pharmacy. 90 supply was sent in march with 1 additional refill  Called spoke with patient he will contact pharmacy for refill. Pt was informed if any issues have pharmacy send request to our office. He states that pharmacy transferred Rx somewhere else unable to confirm where pharmacy sent rx and why

## 2018-12-15 NOTE — Progress Notes (Addendum)
Theodore King was seen today in the movement disorders clinic for neurologic consultation at the request of [name], MD.  The consultation is for the evaluation of PD.  Pt currently under the care of Dr. [name].  The records that were made available to me were reviewed.  Pts girlfriend in the waiting room but he refused to allow her in (I asked few times).  His first symptom was left greater than right upper extremity tremor in 2012.  It started after a tear in the rotator cuff, which he thought was the initial cause.  Around that time, he also developed shuffling gait.  It appears that he started levodopa around that time and was not sure that it was beneficial.  In 2013, he started on pramipexole and did think it was beneficial for tremor.  He did notice, however, that with the course of time when he increased levodopa that it did seem to be helpful.  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  He last saw Dr. [name] in June, 2018.  He has been seen at the emergency room several times since then.  He went to the emergency room on December 2 with complaints of leg spasm and increasing tremors.  He admitted he had not taken his Parkinson's medication the day he was seen in the emergency room (seen around noon).  He had also been given baclofen by his primary care physician, but has not picked that up from the pharmacy.  The biggest issue, according to the emergency room, was noncompliance with medication (also noted in Dr. Gladstone Lighter notes).  Once given medication in the emergency room he felt better.  He was discharged to [facility].  He came back to the emergency room on June 25, 2017 for similar complaints of generalized weakness.  No weakness was found on examination and he was discharged home.  He was given a walker.  Pt reports that he is on carbidopa/levodopa 25/100, 1 three times per day.  He is supposed to be on 2 po tid.  He doesn't know other  meds as daughter takes care of them.  Pramipexole and azilect are listed.    Specific Symptoms:  Tremor: Yes.   Family hx of similar:  No. Voice: "I have a tremor in my voice." Sleep: trouble getting to sleep  Vivid Dreams:  Yes.   - "Its like I see things"  Acting out dreams:  No. Wet Pillows: Yes.   Postural symptoms:  Yes.    Falls?  Yes.  , states that he has had 2 falls.  Last fall was about 2 weeks ago.  He was bending over to put on shoes and he kept going.  Didn't get hurt Bradykinesia symptoms: shuffling gait, slow movements and difficulty getting out of a chair Loss of smell:  No. Loss of taste:  No. Urinary Incontinence:  yes but has urinary urgency and occasionally will wear depends if goes out of town Difficulty Swallowing:  No. Handwriting, micrographia: Yes.   Trouble with ADL's:  No.  Trouble buttoning clothing: No. Depression:  No., but "I get angry at myself" Memory changes:  No. - patient denies memory change.  Daughter (23y/o) lives with the patient.  Has a girlfriend.  Puts med in pillbox and girlfriend prepares it.  Daughter does grocery shopping and pays bills.  Pt does not "but I haven't gotten my license back yet."  He cannot tell me why he lost it but may  be from memory. Hallucinations:  Yes (initially denies and then says "I see people but I know there are not people."  visual distortions: yes - "I think something is crawling on the floor and then I realize nothing is there" N/V:  No. Lightheaded:  No.  Syncope: No. Diplopia:  Yes.   Dyskinesia:  No.  Neuroimaging of the brain has previously been performed.  It is available for my review today.  Had MRI brain in 2013 and it showed rare T2 hyperintensity.    09/29/17 update: Patient was worked in today for a follow-up.  He is seen sooner than expected.  He is accompanied by his girlfriend who supplements the history.  He is also on Azilect.  Nuplazid was started last visit.  There has been much confusion over  this medication and he/his daughter were both advised to call [facility] and we have given the many samples.  I received a call at the end of March complaining about more difficulty walking and episodes of freezing.  Confusion about what he was actually taking and how it was prescribed persisted.  Ultimately, it turns out that the patient was still on pramipexole 1 mg, 2 tablets twice per day.  I decreased that to 1 tablet twice a day for a week and then once per day for a week and then discontinued that medication. He is off of that and has been for 2 weeks.   At the same time, I increased his carbidopa/levodopa 25/1 100 to 2 tablets in the morning, 2 in the afternoon (having trouble remembering that and taking it at 2-3pm instead of 11am-12pm)  and 1 in the evening.  Girlfriend reports that he couldn't even get dressed on his own this AM but now that medication has kicked in he can walk normally.  He has trouble at night and she has to lift his legs in the bed.  Sounds like he has wearing off of medication.  He went into a restaurant bathroom and then could not get out of the restaurant bathroom and she did not know what to do since it was a men's bathroom.  She calls these "spells."  Reports that he is taking baclofen 4 times per day, 20 mg each.  Been on that for "at least 2-3 months."  He is having hallucination once a day or once every 2 days.  He never mentions it to his girlfriend.  He recently had a hallucination when he looked out the window and thought someone was outside taking something apart.  He is very active at night.  Supposed to be on clonazepam, but really has not been taking it.  He is screaming at night and jerking.  Lives with daughter who is at school but stays with girlfriend 3-4 days per week.     He was seen in the emergency room on September 19, 2017.  Those records are reviewed.  He was seen for leg pain and swelling.  Ultrasound was negative for DVT and he was discharged home.    10/13/17  update: The patient is seen today in follow-up.  His girlfriend drove him here but refused to come back to the room when asked by staff members.   He is on carbidopa/levodopa 25/100, 2 tablets at 7 AM/2 tablets at 11 AM and 1 tablet at 4 PM and last visit we added carbidopa/levodopa 50/200 at bedtime.  It is difficult to assess efficacy of this one.   He does state that the alarm  that we sit on his phone last visit has really helped him remember taking his medication.  I asked the patient last visit to check and see whether or not he was on pramipexole, 2 mg twice daily. He brings a med list in today and he is not on that.  He is still on Azilect.  We added very low-dose clonazepam last visit, 0.25 mg, for REM behavior disorder.  "I believe the dreams are gone."  He had one fall since our last visit where he tripped up step.  Didn't get hurt.  He states that hallucinations are gone (baclofen d/c at our last visit but he brings that bottle in today and he is not sure if he is on it).  He still has occasional freezing but not bad per patient.  He asks me about the "new medication" we discussed last visit.   He is going to [facility] 2 days per week.  States that he got in shoulder yesterday and feels better.    01/16/18 update: Patient follows up today for Parkinson's disease and dementia.  Pts accompanied by girlfriend who supplements the hx.  He is supposed to be on carbidopa/levodopa 25/100, 2 tablets at 7 AM, 2 tablets at 11 AM, 1 tablet at 4 PM and carbidopa/levodopa 50/200 at bedtime.  However, girlfriend reports that pharmacy wouldn't give her the IR (pharmacy not open so didn't call) and so she is instead giving him the carbidopa/levodopa 50/200 at 7am/11am/4pm.  This has been so for about a week.  He remains on Azilect as well.  In regards REM behavior disorder, low-dose clonazepam, 0.25 mg, has helped.  He ran out of this about 2 nights ago per girlfriend. Records have been reviewed since our last visit.  There  have been several calls/correspondences with his family.  He has also been in the emergency room in July and in August.  In July, patient was in the emergency room for confusion and slurred speech.  MRI of the brain was obtained on December 12, 2017 and was unremarkable.  Patient was in the emergency room again on January 09, 2017 with generalized weakness.  Family was hoping for nursing placement for rehab.  Social work at the hospital investigated and stated that his insurance would not pay for rehab.  Zanaflex was given for him for muscle spasms. They report he is not taking the zanaflex.   However, when he describes it, he is actually having freezing of the right leg.  Having trouble turning over in the bed at night and getting out of chairs.  No hallucinations.  On nuplazid.    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  He is accompanied by his girlfriend who supplements the history.  He was worked in today.  He and his family walked into the office 2 days ago and stated that he was worse and wanted to be worked in, and this was the appointment we were able to give him.  He is on carbidopa/levodopa 25/100, 2 tablets at 7 AM/2 tablets at 11 AM/1 tablet at 4 PM and carbidopa/levodopa 50/200 at bed time. He describes trouble with first morning on.  Girlfriend describes trouble with him getting in and out of bed.  He has an alarm set on the phone to remind him to take it and that system works well.   I stopped his Azilect and Zanaflex last visit.  He is on clonazepam 0.25 mg at  bedtime.  Records are reviewed since our last visit.  The patient has been in the emergency room several times.  He was there on September 10 with leg pain and was given Flexeril.  Reports he is not taking that.  He was there on October 5, also with leg pain.  Work-up was negative and he was sent home that day.  They called here after that visit and we told them to make  sure that he was taking medications properly because if not, this could cause leg cramping, which was our suspicion.  He was back in the emergency room on October 22 with chest pain, which was felt related to reflux.  Doing well with hallucinations with nuplazid.  05/23/18 update: Patient is seen today for follow-up for Parkinson's disease and Parkinson's related dementia.  He is accompanied by his girlfriend who supplements the history.  His medication was increased last visit so that he is on carbidopa/levodopa 25/100, 2 tablets at 7 AM, 2 tablets at 10 AM, 2 tablets at 2 PM and 1 tablet at 6 PM.  He is also on carbidopa/levodopa 50/200 at bedtime. They don't know what he is taking.  They bring a bottle but it isn't the same instructions as he is supposed to be on (its an old bottle).  They think they are still taking medication at 2 at 7am/2 at 11am/1 at 4pm.   He is supposed to be on clonazepam, 0.25 mg at bedtime but they have stopped that. States that he hasn't really been having dreams.  He is also on nuplazid for hallucinations.  He is free of hallucinations - "that has been straighted out."  Girlfriend describes freezing in the day and trouble with first morning on.  Physical therapy and social work was sent to the home after our last visit. Pt states that PT only came 4 times to the home. No falls.  Dr. Mancel Bale just started patient on flomax.  Just started last night.  Has f/u appt in 2 weeks with pcp.   08/11/18 update: Patient is seen today in follow-up for Parkinson's disease.  He is accompanied by his girlfriend who supplements history.  Girlfriend states that he is getting worse but she cannot tell me how.  Girlfriend states that "when I picked you up yesterday, you weren't doing good."   Patient is on carbidopa/levodopa 25/100, 2 tablets at 7 AM, 2 tablets at 10 AM 2 tablets at 2 PM and 1 tablet at 6 PM, in addition to carbidopa/levodopa 50/200 at bedtime.  He is on nuplazid.  He has not been  having hallucinations.  Girlfriend asks about going back on azilect - "I read it slows down the disease."  She asks about freezing and adding other medications.  States that "I would rather have him hallucinating than freezing."   Records are reviewed since last visit.  Patient was seen in the emergency room on June 24, 2018 with complaints of shortness of breath.  Work-up was unremarkable with the exception of UTI and he was placed on Levaquin for that.  His urinalysis did not reveal nitrites and there is only trace leukocytes.  There was no culture done.  12/19/18 update: Patient is seen today in follow-up.  He is on carbidopa/levodopa 25/100, 2 tablets at 7 AM, 2 tablets at 10 AM, 2 tablets at 1 PM, 1 tablet at 6 PM.  Last visit, I added entacapone, 200 mg to the first 3 dosages of levodopa.  He report that it  seemed to help.  He is still on carbidopa/levodopa 50/200 at bedtime.  He is also on Nuplazid for hallucinations. Pt denies falls.  Pt denies lightheadedness, near syncope.  Mood has been good.  I did refer him to urology after our last visit.  He reports that he went and "gave me some pills to slow it down" but the medication didn't seem to help. Reports that he doesn't know the name of the medication but he is still on it.  Reports that he is spending 80% of his time at his house and 20% of time at girlfriends house.  Daughter works at [employer] but she is home almost every night.  She has now graduated from college.    PREVIOUS MEDICATIONS: Sinemet; azilect; zanaflex; pramipexole  ALLERGIES:  No Known Allergies  CURRENT MEDICATIONS:  Outpatient Encounter Medications as of 12/19/2018  Medication Sig   albuterol (VENTOLIN HFA) 108 (90 Base) MCG/ACT inhaler INHALE 2 PUFFS BY MOUTH 4 TIMES DAILY AS NEEDED   carbidopa-levodopa (SINEMET CR) 50-200 MG tablet Take 1 tablet by mouth at bedtime.   carbidopa-levodopa (SINEMET IR) 25-100 MG tablet 2 tablets at 7AM, 2 tablets at 10AM, 2 tablets at 2PM,  1 tablet at 6pm (Patient taking differently: Take 1-2 tablets by mouth See admin instructions. 2 tablets at 7AM, 2 tablets at 10AM, 2 tablets at 2PM, 1 tablet at 6pm)   entacapone (COMTAN) 200 MG tablet TAKE 1 TABLET BY MOUTH THREE TIMES DAILY TAKE  1  AT  7AM,  10AM,  2PM.   ibuprofen (ADVIL,MOTRIN) 400 MG tablet Take 400 mg by mouth every 6 (six) hours as needed.   metFORMIN (GLUCOPHAGE) 500 MG tablet Take 500 mg by mouth daily with breakfast.    NUPLAZID 34 MG CAPS TAKE 1 CAPSULE BY MOUTH ONCE DAILY   meclizine (ANTIVERT) 25 MG tablet TAKE 1 TABLET BY MOUTH TWICE DAILY AS NEEDED FOR DIZZINESS   No facility-administered encounter medications on file as of 12/19/2018.     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)    Paralysis agitans ([place]) 05/18/2013   Parkinson's disease (Home)    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   SOB (shortness of breath) 03/23/2018   Unspecified vitamin D deficiency    Unspecified vitamin D deficiency    Vertigo, benign paroxysmal 11/05/2016   Vitamin D deficiency 05/18/2013    PAST SURGICAL HISTORY:   Past Surgical History:  Procedure Laterality Date   APPENDECTOMY     ROTATOR CUFF REPAIR     right    SOCIAL HISTORY:   Social History   Socioeconomic History   Marital status: Legally Separated    Spouse name: Not on file   Number of children: 3   Years of education: 11th   Highest education level: 11th grade  Occupational History    Employer: RETIRED  Scientist, product/process development strain: Not on file   Food insecurity    Worry: Not on file    Inability: Not on file   Transportation needs    Medical:  Not on file    Non-medical: Not on file  Tobacco Use   Smoking status: Former Smoker  Packs/day: 0.50    Years: 20.00    Pack years: 10.00    Start date: 08/14/1953    Quit date: 05/23/1974    Years since quitting: 44.6   Smokeless tobacco: Never Used  Substance and Sexual Activity   Alcohol use: Not Currently   Drug use: No   Sexual activity: Never  Lifestyle   Physical activity    Days per week: Not on file    Minutes per session: Not on file   Stress: Not on file  Relationships   Social connections    Talks on phone: Not on file    Gets together: Not on file    Attends religious service: Not on file    Active member of club or organization: Not on file    Attends meetings of clubs or organizations: Not on file    Relationship status: Not on file   Intimate partner violence    Fear of current or ex partner: Not on file    Emotionally abused: Not on file    Physically abused: Not on file    Forced sexual activity: Not on file  Other Topics Concern   Not on file  Social History Narrative   Not on file    FAMILY HISTORY:   Family Status  Relation Name Status   Mother  Deceased   Father  Deceased   Brother [place] Deceased   Sister Kyung Bacca Deceased   Daughter Albina Billet Alive   Son Legrand Como Deceased   Sister Hulda Marin Deceased   Brother [place] Alive   Brother [place] Alive   Brother [place] Alive   Daughter [name] Alive   Neg Hx  (Not Specified)    ROS: Review of Systems  Constitutional: Negative.   HENT: Negative.   Eyes: Negative.   Respiratory: Positive for shortness of breath (only with exertion).   Cardiovascular: Negative.   Gastrointestinal: Negative.   Genitourinary: Positive for frequency.  Musculoskeletal: Positive for joint pain (R knee).  Skin: Negative.     PHYSICAL EXAMINATION:    VITALS:   Vitals:   12/19/18 1351  BP: 127/71  Pulse: 71    Temp: 97.8 F (36.6 C)  SpO2: 98%  Weight: 211 lb 6.4 oz (95.9 kg)  Height: 5\' 10"  (1.778 m)     Orientation: The patient is alert and oriented to person and place. Montreal Cognitive Assessment  12/19/2018 07/19/2017  Visuospatial/ Executive (0/5) 1 1  Naming (0/3) 2 2  Attention: Read list of digits (0/2) 2 1  Attention: Read list of letters (0/1) 0 0  Attention: Serial 7 subtraction starting at 100 (0/3) 0 1  Language: Repeat phrase (0/2) 0 2  Language : Fluency (0/1) 0 1  Abstraction (0/2) 0 0  Delayed Recall (0/5) 0 0  Orientation (0/6) 5 4  Total 10 12  Adjusted Score (based on education) 11 13   GEN:  The patient appears stated age and is in NAD. GEN:  The patient appears stated age and is in NAD. HEENT:  Normocephalic, atraumatic.  The mucous membranes are moist. The superficial temporal arteries are without ropiness or tenderness. CV:  RRR Lungs:  CTAB Neck/HEME:  There are no carotid bruits bilaterally.  Neurological examination:  Orientation: The patient is alert and oriented x3. Cranial nerves: There is good facial symmetry. The speech is fluent and clear. Soft palate rises symmetrically and there is no  tongue deviation. Hearing is intact to conversational tone. Sensation: Sensation is intact to light touch throughout Motor: Strength is 5/5 in the bilateral upper and lower extremities.   Shoulder shrug is equal and symmetric.  There is no pronator drift.  Movement examination: Tone: There is normal tone in the UE/LE Abnormal movements: none Coordination:  There is no decremation with RAM's, with any form of RAMS, including alternating supination and pronation of the forearm, hand opening and closing, finger taps. Gait and Station: The patient has no difficulty arising out of a deep-seated chair without the use of the hands. The patient's stride length is good.     ASSESSMENT/PLAN:  1.  Idiopathic Parkinson's disease, by history  -Patient will continue with  carbidopa/levodopa 25/100, 2 tablets at 7 AM, 2 tablets at 10 AM 2 tablets at 2 PM and 1 tablet at 6 PM, in addition to carbidopa/levodopa 50/200 at bedtime.    -continue comtan 200 mg tid with first 3 dosages of carbidopa/levodopa   2.  PDD with hallucinations  -Nuplazid resolved his hallucinations.  He is free of hallucinations.    -we have tried many times to address family/social dynamics but have not changes.  Fortunately , pt does seem to know when to take meds (relays times to me today)  3.  REM behavior disorder  -He was on low-dose clonazepam, 0.25 mg.  They have stopped that.  At this point, I did not restarted.  We will just need to watch him closely.  4.  Leg pain  -unrelated to PD.  He asks me about the knee pain today and I told him needed to f/u with Dr. Lynann Bologna  5.  Urinary urgency/frequency/incontinence  -he needs to f/u with urology.  I will also try to get a copy of those records. Addendum:  Requested records and received note that pt didn't show for his April 17 appt  6. Follow up is anticipated in the next 4-6 months, sooner should new neurologic issues arise.  Much greater than 50% of this visit was spent in counseling and coordinating care.  Total face to face time:  25 min   Cc:  [name], MD

## 2018-12-19 ENCOUNTER — Encounter: Payer: Self-pay | Admitting: Neurology

## 2018-12-19 ENCOUNTER — Other Ambulatory Visit: Payer: Self-pay

## 2018-12-19 ENCOUNTER — Ambulatory Visit (INDEPENDENT_AMBULATORY_CARE_PROVIDER_SITE_OTHER): Payer: Medicare HMO | Admitting: Neurology

## 2018-12-19 VITALS — BP 127/71 | HR 71 | Temp 97.8°F | Ht 70.0 in | Wt 211.4 lb

## 2018-12-19 DIAGNOSIS — G20 Parkinson's disease: Secondary | ICD-10-CM

## 2018-12-19 DIAGNOSIS — R3915 Urgency of urination: Secondary | ICD-10-CM | POA: Diagnosis not present

## 2018-12-19 DIAGNOSIS — F0280 Dementia in other diseases classified elsewhere without behavioral disturbance: Secondary | ICD-10-CM | POA: Diagnosis not present

## 2018-12-19 NOTE — Patient Instructions (Signed)
1.  There will be no changes to your parkinsons medications.  You look adequately treated from a Parkinsons standpoint.  Let me know when your medications need refilled  2.  You need to make a follow up appointment with your urologist  3.  You need to make a follow up with Dr. Lynann Bologna about your knee pain.

## 2019-01-10 ENCOUNTER — Telehealth: Payer: Self-pay | Admitting: Clinical

## 2019-01-10 DIAGNOSIS — G20 Parkinson's disease: Secondary | ICD-10-CM

## 2019-01-10 DIAGNOSIS — F0280 Dementia in other diseases classified elsewhere without behavioral disturbance: Secondary | ICD-10-CM

## 2019-01-10 NOTE — Telephone Encounter (Signed)
Called patient daughter no answer left message to call office back to discuss

## 2019-01-10 NOTE — Telephone Encounter (Signed)
Okay for PT.  Please order PT with [facility].

## 2019-01-10 NOTE — Telephone Encounter (Signed)
I don't have much further to add.  The biggest issue here is lack of someone consistently helping him with meds and unwillingness of daughter and girlfriend to work together to help him (he goes back and forth between homes).  We have had social work talk with them many times and I have addressed many times, but until he has someone watching him 24 hours per day and distributing his meds, this will be a consistent issue.

## 2019-01-10 NOTE — Telephone Encounter (Signed)
Pt daughter [name] (DPR) called , spoke with her and patient  Pt c/o leg muscle spasm, leg weakness, unable to move from side of bed, hard to walk  Current medication: cabidopa-levodopa 25/100 2 at 7AM 2 at 10AM 2 at 2PM 1 AT 6PM Carbidopa-levodopa 50/200 at bedtime nuplazid 34 mg daily Entacapone TID 7AM/10AM/2PM  Reviewed patient weekly pill box  with daughter sinement 25/100 and nuplazid missing from weekly tabs. She is unaware how long he has been without medication. Pt daughter said he had 2 other people helping with his medications. She will start back overseeing medication. Refills was delivered to her today.  [place] for PT for strengthen, balance training Denies falls   Call daughter back at [phone]

## 2019-01-10 NOTE — Telephone Encounter (Signed)
LCSW took call from pt's daughter [name] who reports that pt is concerned that his PD sx are worsening-weakness in muscles & voice, stiffness, trouble moving. Daughter reports pt has been much less active since being more house-bound with covid.   LCSW discussed  -potential for P/T referral following Dr TAT review, daughter interested -zoom exercise options, daughter interested  I will email her exercise  info to [email or web address]  Pt phone [phone], daughter requested nurse call pt to assess sx and see if he should go to ER.

## 2019-01-10 NOTE — Telephone Encounter (Signed)
Called patient daughter no answer left message on voice mail that provider agrees with her consistent help with his medications.  Order place for [facility]

## 2019-01-10 NOTE — Telephone Encounter (Signed)
[facility] for home health PT?

## 2019-01-15 ENCOUNTER — Ambulatory Visit: Payer: Self-pay | Admitting: Neurology

## 2019-01-16 ENCOUNTER — Telehealth: Payer: Self-pay

## 2019-01-16 NOTE — Telephone Encounter (Signed)
Received call from [facility] patient PT from [facility] Evaluation was done yesterday. Requesting verbal orders for 2 times a week for 4 weeks. 1 x a week for 4 weeks.  Verbal order given

## 2019-01-24 ENCOUNTER — Telehealth: Payer: Self-pay | Admitting: Clinical

## 2019-01-24 NOTE — Telephone Encounter (Signed)
LCSW LVM w DOP to f/u on previous concerns re med management -requested rtc at her convenience

## 2019-01-30 ENCOUNTER — Telehealth: Payer: Self-pay | Admitting: Clinical

## 2019-01-30 NOTE — Telephone Encounter (Signed)
Called patient daughter no answer left message to call office back before 4:30 pm

## 2019-01-30 NOTE — Telephone Encounter (Signed)
We have met with this family so many times regarding his medications, both the daughter and the girlfriend.  Social work has been involved for a long time with him and unfortunately, no one has consistently helped him.  I am glad to hear that right now his daughter is helping.  I would guess that the colored pill they are describing is entacapone but not sure (if his urine isn't orange, he probably isn't on it)

## 2019-01-30 NOTE — Telephone Encounter (Signed)
Daughter called, she says pt's PD sx have improved since she'd gotten him back on medication regimen.   She has some questions re what dose/s of carbidopa/levodopa pt should be taking & when and would like clarification and updated med list. She notes 'a football shaped brown pill" that she is unsure what it is for, I told her to get the numbers/letters on it so we can identify it when she calls back.   When you have a moment, will you call her back and go over med list. She can be reached at [phone]  Thank you!

## 2019-01-31 MED ORDER — CARBIDOPA-LEVODOPA ER 50-200 MG PO TBCR: 1.0000 | EXTENDED_RELEASE_TABLET | Freq: Every day | ORAL | 0 refills | Status: DC

## 2019-01-31 MED ORDER — CARBIDOPA-LEVODOPA 25-100 MG PO TABS: ORAL_TABLET | ORAL | 0 refills | Status: DC

## 2019-01-31 MED ORDER — ENTACAPONE 200 MG PO TABS: ORAL_TABLET | ORAL | 0 refills | Status: DC

## 2019-01-31 NOTE — Telephone Encounter (Signed)
Daughter [name] called back we discuss patient mediations. She states that he has change to [employer] and now receiving his medication from [facility] mail order. Pt will be out before his deliver. She is requesting a 2 week supply be sent to local pharmacy. ALL 3 Rx sent

## 2019-01-31 NOTE — Addendum Note (Signed)
Addended by: Ranae Plumber on: 01/31/2019 08:10 AM   Modules accepted: Orders

## 2019-02-21 ENCOUNTER — Telehealth: Payer: Self-pay | Admitting: Neurology

## 2019-02-21 MED ORDER — CARBIDOPA-LEVODOPA ER 50-200 MG PO TBCR: 1.0000 | EXTENDED_RELEASE_TABLET | Freq: Every day | ORAL | 0 refills | Status: DC

## 2019-02-21 NOTE — Telephone Encounter (Signed)
Called patient daughter to confirm if patient received Rx in mail back in Aug. A 2 week supply was sent in back in August for patient while he waited on his mail order refills. No answer left message for her to call office back. Pt has been having several people helping with medications. His daughter agreed to manage his medications.

## 2019-02-21 NOTE — Telephone Encounter (Signed)
Requested Prescriptions   Pending Prescriptions Disp Refills  . carbidopa-levodopa (SINEMET CR) 50-200 MG tablet 14 tablet 0    Sig: Take 1 tablet by mouth at bedtime.   Rx last filled:01/31/19 #14 0 refills  Pt last seen:12/19/18  Follow up appt scheduled: 05/22/19

## 2019-02-21 NOTE — Telephone Encounter (Signed)
error 

## 2019-02-21 NOTE — Telephone Encounter (Signed)
Patient's friend called regarding Brandy needing his Carbidopa medication that he takes at night time. She said he has not received his pill pack yet. He uses Product/process development scientist. Thanks

## 2019-02-23 ENCOUNTER — Other Ambulatory Visit: Payer: Self-pay | Admitting: Neurology

## 2019-02-23 ENCOUNTER — Telehealth: Payer: Self-pay | Admitting: Neurology

## 2019-02-23 MED ORDER — CARBIDOPA-LEVODOPA ER 50-200 MG PO TBCR: 1.0000 | EXTENDED_RELEASE_TABLET | Freq: Every day | ORAL | 1 refills | Status: DC

## 2019-02-23 MED ORDER — ENTACAPONE 200 MG PO TABS: ORAL_TABLET | ORAL | 1 refills | Status: DC

## 2019-02-23 MED ORDER — CARBIDOPA-LEVODOPA 25-100 MG PO TABS: ORAL_TABLET | ORAL | 1 refills | Status: DC

## 2019-02-23 NOTE — Telephone Encounter (Signed)
Patient daughter states that [facility] was supposed to fax over something about patient medication and they are stating that they have not received anything from [place]. She needs all of his medications sent to them now they have switched Pharmacy.   She would also like to speak to a nurse about getting a updated list of medication and how he is to take them so she makes sure she is doing it correctly. Also she has some other questions about what is going on with patient

## 2019-02-23 NOTE — Telephone Encounter (Signed)
Have not received anything from mail order pharmacy but will send all that provider prescribes.Marland Kitchen  Spoke with daughter [name] she is requesting a copy of patient medication list to follow at home.

## 2019-02-24 ENCOUNTER — Other Ambulatory Visit: Payer: Self-pay | Admitting: Neurology

## 2019-02-27 ENCOUNTER — Telehealth: Payer: Self-pay | Admitting: Neurology

## 2019-02-27 NOTE — Telephone Encounter (Signed)
Patient's daughter called regarding needing a refill on  Theodore King's medication Carbidopa Levodopa 25-100. She said he only has 3 days worth of medication. She said [employer] has just now sent out the Rx. She is asking can he have some medication sent to [facility] at [facility] for him? Please Call. Thanks

## 2019-02-27 NOTE — Telephone Encounter (Signed)
Rx was sent to pharmacy yesterday.  Pt daughter made aware

## 2019-03-09 ENCOUNTER — Telehealth: Payer: Self-pay | Admitting: Neurology

## 2019-03-09 NOTE — Telephone Encounter (Signed)
Refills on the metformin, meclizine medication to the pharm on file. He is out of medication. She is unsure of the amount he usually gets. Thanks!

## 2019-03-12 NOTE — Telephone Encounter (Signed)
Spoke with patient daughter she will check with the receptionist  at the apartment complex to see if they have medication. If not she will contact [employer] about reshipping medication.

## 2019-03-12 NOTE — Telephone Encounter (Addendum)
Patient's daughter, [name], called requesting a 90 day supply for: entacapone 200 MG and carbidopa-levedopa 25-100 MG. She said it will be easier that way.  [facility] on [address], [place]

## 2019-03-12 NOTE — Telephone Encounter (Signed)
See all other telephone notes  We sent refills for these medications to local and to mail order pharmacy. She should not be requesting more refills at this time. Not sure if she is keeping up with him medications correctly.   Will call [employer] Mail order pharmacy to see if refills that was sent 2 weeks ago was delivered to patient home.

## 2019-03-12 NOTE — Telephone Encounter (Signed)
Neither of theses medications are prescribe by Dr. Carles Collet patient daughter should contact the provider that prescribes these medications.

## 2019-03-12 NOTE — Telephone Encounter (Signed)
Called daughter [name] no answer left this message on her voice mail

## 2019-03-12 NOTE — Telephone Encounter (Signed)
Called [employer] spoke with Theodore King he states that patient 3 medications from Dr. Carles Collet was ship on 02/27/19 and delivered on 03/05/19.  Not sure why patient daughter is requesting refills at this time.  Will contact her to find out

## 2019-04-07 ENCOUNTER — Encounter (HOSPITAL_COMMUNITY): Payer: Self-pay

## 2019-04-07 ENCOUNTER — Emergency Department (HOSPITAL_COMMUNITY): Payer: Medicare HMO

## 2019-04-07 ENCOUNTER — Other Ambulatory Visit: Payer: Self-pay

## 2019-04-07 ENCOUNTER — Inpatient Hospital Stay (HOSPITAL_COMMUNITY)
Admission: EM | Admit: 2019-04-07 | Discharge: 2019-04-10 | DRG: 177 | Disposition: A | Discharge status: Home Health | Payer: Medicare HMO | Attending: Internal Medicine | Admitting: Internal Medicine

## 2019-04-07 DIAGNOSIS — J129 Viral pneumonia, unspecified: Secondary | ICD-10-CM | POA: Diagnosis not present

## 2019-04-07 DIAGNOSIS — Z7984 Long term (current) use of oral hypoglycemic drugs: Secondary | ICD-10-CM

## 2019-04-07 DIAGNOSIS — J1289 Other viral pneumonia: Secondary | ICD-10-CM | POA: Diagnosis present

## 2019-04-07 DIAGNOSIS — D72819 Decreased white blood cell count, unspecified: Secondary | ICD-10-CM | POA: Diagnosis present

## 2019-04-07 DIAGNOSIS — J189 Pneumonia, unspecified organism: Secondary | ICD-10-CM

## 2019-04-07 DIAGNOSIS — R5383 Other fatigue: Secondary | ICD-10-CM | POA: Diagnosis not present

## 2019-04-07 DIAGNOSIS — J449 Chronic obstructive pulmonary disease, unspecified: Secondary | ICD-10-CM

## 2019-04-07 DIAGNOSIS — G4752 REM sleep behavior disorder: Secondary | ICD-10-CM | POA: Diagnosis present

## 2019-04-07 DIAGNOSIS — J984 Other disorders of lung: Secondary | ICD-10-CM | POA: Diagnosis present

## 2019-04-07 DIAGNOSIS — Z823 Family history of stroke: Secondary | ICD-10-CM | POA: Diagnosis not present

## 2019-04-07 DIAGNOSIS — R7303 Prediabetes: Secondary | ICD-10-CM | POA: Diagnosis present

## 2019-04-07 DIAGNOSIS — J439 Emphysema, unspecified: Secondary | ICD-10-CM | POA: Diagnosis not present

## 2019-04-07 DIAGNOSIS — J440 Chronic obstructive pulmonary disease with acute lower respiratory infection: Secondary | ICD-10-CM | POA: Diagnosis present

## 2019-04-07 DIAGNOSIS — U071 COVID-19: Principal | ICD-10-CM | POA: Diagnosis present

## 2019-04-07 DIAGNOSIS — Z791 Long term (current) use of non-steroidal anti-inflammatories (NSAID): Secondary | ICD-10-CM | POA: Diagnosis not present

## 2019-04-07 DIAGNOSIS — E785 Hyperlipidemia, unspecified: Secondary | ICD-10-CM | POA: Diagnosis present

## 2019-04-07 DIAGNOSIS — Z87891 Personal history of nicotine dependence: Secondary | ICD-10-CM

## 2019-04-07 DIAGNOSIS — J9611 Chronic respiratory failure with hypoxia: Secondary | ICD-10-CM | POA: Diagnosis present

## 2019-04-07 DIAGNOSIS — Z801 Family history of malignant neoplasm of trachea, bronchus and lung: Secondary | ICD-10-CM

## 2019-04-07 DIAGNOSIS — R6510 Systemic inflammatory response syndrome (SIRS) of non-infectious origin without acute organ dysfunction: Principal | ICD-10-CM

## 2019-04-07 DIAGNOSIS — N400 Enlarged prostate without lower urinary tract symptoms: Secondary | ICD-10-CM | POA: Diagnosis not present

## 2019-04-07 DIAGNOSIS — G20A1 Parkinson's disease without dyskinesia, without mention of fluctuations: Secondary | ICD-10-CM | POA: Diagnosis present

## 2019-04-07 DIAGNOSIS — Z79899 Other long term (current) drug therapy: Secondary | ICD-10-CM | POA: Diagnosis not present

## 2019-04-07 DIAGNOSIS — G629 Polyneuropathy, unspecified: Secondary | ICD-10-CM | POA: Diagnosis present

## 2019-04-07 DIAGNOSIS — D696 Thrombocytopenia, unspecified: Secondary | ICD-10-CM | POA: Diagnosis present

## 2019-04-07 DIAGNOSIS — J1282 Pneumonia due to coronavirus disease 2019: Secondary | ICD-10-CM | POA: Diagnosis present

## 2019-04-07 DIAGNOSIS — G20 Parkinson's disease: Secondary | ICD-10-CM | POA: Diagnosis present

## 2019-04-07 LAB — BASIC METABOLIC PANEL
Anion gap: 11 (ref 5–15)
BUN: 20 mg/dL (ref 8–23)
CO2: 25 mmol/L (ref 22–32)
Calcium: 8.9 mg/dL (ref 8.9–10.3)
Chloride: 102 mmol/L (ref 98–111)
Creatinine, Ser: 1.15 mg/dL (ref 0.61–1.24)
GFR calc Af Amer: >60 mL/min (ref >60)
GFR calc non Af Amer: >60 mL/min (ref >60)
Glucose, Bld: 99 mg/dL (ref 70–99)
Potassium: 4.2 mmol/L (ref 3.5–5.1)
Sodium: 138 mmol/L (ref 135–145)

## 2019-04-07 LAB — I-STAT CHEM 8, ED
BUN: 19 mg/dL (ref 8–23)
Calcium, Ion: 1.15 mmol/L (ref 1.15–1.40)
Chloride: 101 mmol/L (ref 98–111)
Creatinine, Ser: 1.1 mg/dL (ref 0.61–1.24)
Glucose, Bld: 97 mg/dL (ref 70–99)
HCT: 39 % (ref 39.0–52.0)
Hemoglobin: 13.3 g/dL (ref 13.0–17.0)
Potassium: 4.2 mmol/L (ref 3.5–5.1)
Sodium: 139 mmol/L (ref 135–145)
TCO2: 27 mmol/L (ref 22–32)

## 2019-04-07 LAB — CBC
HCT: 42.8 % (ref 39.0–52.0)
Hemoglobin: 14.3 g/dL (ref 13.0–17.0)
MCH: 28 pg (ref 26.0–34.0)
MCHC: 33.4 g/dL (ref 30.0–36.0)
MCV: 83.9 fL (ref 80.0–100.0)
Platelets: 120 10*3/uL — ABNORMAL LOW (ref 150–400)
RBC: 5.1 MIL/uL (ref 4.22–5.81)
RDW: 12.3 % (ref 11.5–15.5)
WBC: 3.8 10*3/uL — ABNORMAL LOW (ref 4.0–10.5)
nRBC: 0 % (ref 0.0–0.2)

## 2019-04-07 LAB — TROPONIN I (HIGH SENSITIVITY)
Troponin I (High Sensitivity): 7 ng/L (ref <18)
Troponin I (High Sensitivity): 6 ng/L (ref <18)

## 2019-04-07 LAB — HEPATIC FUNCTION PANEL
ALT: 16 U/L (ref 0–44)
AST: 26 U/L (ref 15–41)
Albumin: 3.3 g/dL — ABNORMAL LOW (ref 3.5–5.0)
Alkaline Phosphatase: 56 U/L (ref 38–126)
Bilirubin, Direct: 0.2 mg/dL (ref 0.0–0.2)
Indirect Bilirubin: 0.7 mg/dL (ref 0.3–0.9)
Total Bilirubin: 0.9 mg/dL (ref 0.3–1.2)
Total Protein: 6.7 g/dL (ref 6.5–8.1)

## 2019-04-07 LAB — BRAIN NATRIURETIC PEPTIDE: B Natriuretic Peptide: 19.8 pg/mL (ref 0.0–100.0)

## 2019-04-07 LAB — LACTIC ACID, PLASMA: Lactic Acid, Venous: 0.9 mmol/L (ref 0.5–1.9)

## 2019-04-07 MED ORDER — ALBUTEROL SULFATE (2.5 MG/3ML) 0.083% IN NEBU
3.0000 mL | INHALATION_SOLUTION | RESPIRATORY_TRACT | Status: DC | PRN
  Filled 2019-04-07: qty 3

## 2019-04-07 MED ORDER — ENOXAPARIN SODIUM 40 MG/0.4ML ~~LOC~~ SOLN: 40.0000 mg | SUBCUTANEOUS | Status: DC

## 2019-04-07 MED ORDER — SODIUM CHLORIDE 0.9 % IV SOLN: 1.0000 g | Freq: Once | INTRAVENOUS | Status: DC

## 2019-04-07 MED ORDER — SODIUM CHLORIDE 0.9% FLUSH: 3.0000 mL | INTRAVENOUS | Status: DC | PRN

## 2019-04-07 MED ORDER — ENOXAPARIN SODIUM 40 MG/0.4ML ~~LOC~~ SOLN
40.0000 mg | Freq: Every day | SUBCUTANEOUS | Status: DC
  Administered 2019-04-08 – 2019-04-10 (×3): 40 mg via SUBCUTANEOUS
  Filled 2019-04-07 (×3): qty 0.4

## 2019-04-07 MED ORDER — METFORMIN HCL 500 MG PO TABS
500.0000 mg | ORAL_TABLET | Freq: Every day | ORAL | Status: DC
  Administered 2019-04-08: 500 mg via ORAL
  Filled 2019-04-07 (×2): qty 1

## 2019-04-07 MED ORDER — CARBIDOPA-LEVODOPA 25-100 MG PO TABS
2.0000 | ORAL_TABLET | ORAL | Status: DC
  Administered 2019-04-08 – 2019-04-10 (×9): 2 via ORAL
  Filled 2019-04-07 (×13): qty 2

## 2019-04-07 MED ORDER — SODIUM CHLORIDE 0.9 % IV SOLN: 250.0000 mL | INTRAVENOUS | Status: DC | PRN

## 2019-04-07 MED ORDER — SODIUM CHLORIDE 0.9 % IV SOLN: 500.0000 mg | Freq: Once | INTRAVENOUS | Status: DC

## 2019-04-07 MED ORDER — CARBIDOPA-LEVODOPA 25-100 MG PO TABS
1.0000 | ORAL_TABLET | Freq: Every day | ORAL | Status: DC
  Administered 2019-04-07 – 2019-04-09 (×3): 1 via ORAL
  Filled 2019-04-07 (×4): qty 1

## 2019-04-07 MED ORDER — IBUPROFEN 400 MG PO TABS
400.0000 mg | ORAL_TABLET | Freq: Four times a day (QID) | ORAL | Status: DC | PRN
  Administered 2019-04-08: 400 mg via ORAL
  Filled 2019-04-07 (×2): qty 1

## 2019-04-07 MED ORDER — PIMAVANSERIN TARTRATE 34 MG PO CAPS
1.0000 | ORAL_CAPSULE | Freq: Every day | ORAL | Status: DC
  Administered 2019-04-09 – 2019-04-10 (×2): 1 via ORAL
  Filled 2019-04-07 (×3): qty 1

## 2019-04-07 MED ORDER — ENTACAPONE 200 MG PO TABS
200.0000 mg | ORAL_TABLET | ORAL | Status: DC
  Administered 2019-04-07 – 2019-04-10 (×10): 200 mg via ORAL
  Filled 2019-04-07 (×14): qty 1

## 2019-04-07 MED ORDER — SODIUM CHLORIDE 0.9% FLUSH
3.0000 mL | Freq: Two times a day (BID) | INTRAVENOUS | Status: DC
  Administered 2019-04-08 – 2019-04-10 (×6): 3 mL via INTRAVENOUS

## 2019-04-07 MED ORDER — IOHEXOL 350 MG/ML SOLN
100.0000 mL | Freq: Once | INTRAVENOUS | Status: AC | PRN
  Administered 2019-04-07: 100 mL via INTRAVENOUS

## 2019-04-07 NOTE — H&P (Signed)
History and Physical    Theodore King [id number] DOB: 20-Sep-1945 DOA: 04/07/2019  PCP: [name], MD (Confirm with patient/family/NH records and if not entered, this has to be entered at [facility] point of entry) Patient coming from: Patient coming from home  I have personally briefly reviewed patient's old medical records in [place]  Chief Complaint: Dyspnea on exertion, shortness of breath at rest  HPI: Theodore King is a 73 y.o. male with medical history significant of progressive Parkinson's disease and a history of being short of breath.  He is followed by Dr. Carles Collet for neurology and has been seen by Dr. Ebony Hail for pulmonary.  Last pulmonary visit August 15, 2018 patient by PFTs diagnosed with combined COPD and restrictive lung disease.  Patient reports that over the last 24 to 36 hours he had increasing shortness of breath and increasing dyspnea on exertion.  He denies fevers but admits to rigors.  Patient has been out of his home moving about his apartment and parking lot.  He has had visitors at home.  Lives with his daughter who is continuing to work.  Patient does admit to myalgias.  As of his increased shortness of breath he [facility] for evaluation  ED Course: Patient is hemodynamically stable.  Oxygen saturations are normal.  Lab reveals a leukopenia and mild number cytopenia.  CTA chest revealed patchy infiltrates in the right upper lobe, right lower lobe, right middle lobe, and infiltrate with atelectasis in the left lower lobe.  Patient referred to [facility] for admission with viral pneumonia COVID-19 testing pending  Review of Systems: As per HPI otherwise 10 point review of systems negative.    Past Medical History:  Diagnosis Date  . Abnormal dreams 09/06/2013  . Disorders of bursae and tendons in shoulder region, unspecified   . Hyperglycemia 05/18/2013  . Hyperlipidemia LDL goal < 100 05/18/2013  . Other and unspecified hyperlipidemia   .  Other and unspecified hyperlipidemia   . 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 Paralysis agitans ([place]) 05/18/2013  . Parkinson's disease ([place])   . 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 SOB (shortness of breath) 03/23/2018  . Unspecified vitamin D deficiency   . Unspecified vitamin D deficiency   . Vertigo, benign paroxysmal 11/05/2016  . Vitamin D deficiency 05/18/2013    Past Surgical History:  Procedure Laterality Date  . APPENDECTOMY    . ROTATOR CUFF REPAIR     right   Social history -patient's been married twice and currently is single.  He has 2 daughters, 1 son deceased, 3 grandchildren and 4 great-grandchildren.  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  Patient lives in an apartment with his daughter.  He does have a girlfriend.   reports that he quit smoking about 44 years ago. He started smoking about 65 years ago. He has a 10.00 pack-year smoking history. He has never used smokeless tobacco. He reports previous alcohol use. He reports that he does not use drugs.  No Known Allergies  Family History  Problem Relation Age of Onset  . Cancer Mother   . Cancer Father   . Stroke Brother   . Cancer Sister   . Healthy Daughter   . Cancer Sister   . Lung cancer Brother   . Healthy Daughter   . Colon cancer Neg Hx   .  Esophageal cancer Neg Hx   . Rectal cancer Neg Hx   . Stomach cancer Neg Hx      Prior to Admission medications   Medication Sig Start Date End Date Taking? Authorizing Provider  albuterol (VENTOLIN HFA) 108 (90 Base) MCG/ACT inhaler INHALE 2 PUFFS BY MOUTH 4 TIMES DAILY AS NEEDED 10/03/18   [provider]  carbidopa-levodopa (SINEMET CR) 50-200 MG tablet Take 1 tablet by mouth at bedtime. 02/23/19   Tat, Eustace Quail, DO  carbidopa-levodopa (SINEMET IR) 25-100 MG tablet TAKE 2 TABLETS BY MOUTH AT 7 AM , 2 TABLETS AT 10 AM , 2 TABLETS AT 2  PM, 1 TABLET AT 6 PM 02/26/19   Tat, Rebecca S, DO  entacapone (COMTAN) 200 MG tablet TAKE 1 TABLET BY MOUTH THREE TIMES DAILY TAKE  1  AT  7AM,  10AM,  2PM. 02/23/19   Tat, Eustace Quail, DO  ibuprofen (ADVIL,MOTRIN) 400 MG tablet Take 400 mg by mouth every 6 (six) hours as needed.    [provider]  meclizine (ANTIVERT) 25 MG tablet TAKE 1 TABLET BY MOUTH TWICE DAILY AS NEEDED FOR DIZZINESS 10/02/18   [provider]  metFORMIN (GLUCOPHAGE) 500 MG tablet Take 500 mg by mouth daily with breakfast.     [provider]  NUPLAZID 34 MG CAPS TAKE 1 CAPSULE BY MOUTH ONCE DAILY 08/28/18   Tat, Eustace Quail, DO    Physical Exam: Vitals:   04/07/19 1930 04/07/19 2000 04/07/19 2030 04/07/19 2100  BP: (!) 168/72 (!) 159/75 (!) 155/74 138/71  Pulse:   69 81  Resp: 20 20 20 18   Temp:      TempSrc:      SpO2:   98% 96%  Weight:      Height:        Constitutional: NAD, calm, comfortable Vitals:   04/07/19 1930 04/07/19 2000 04/07/19 2030 04/07/19 2100  BP: (!) 168/72 (!) 159/75 (!) 155/74 138/71  Pulse:   69 81  Resp: 20 20 20 18   Temp:      TempSrc:      SpO2:   98% 96%  Weight:      Height:       Eyes: PERRL, lids and conjunctivae normal ENMT: Mucous membranes are moist. Posterior pharynx clear of any exudate or lesions.Normal dentition.  Neck: normal, supple, no masses, no thyromegaly Respiratory: clear to auscultation bilaterally, no wheezing, no crackles. Normal respiratory effort. No accessory muscle use.  Cardiovascular: Regular rate and rhythm, no murmurs / rubs / gallops. No extremity edema. 2+ pedal pulses. No carotid bruits.  Abdomen: no tenderness, no masses palpated. No hepatosplenomegaly. Bowel sounds positive.  Musculoskeletal: no clubbing / cyanosis. No joint deformity upper and lower extremities. Good ROM, no contractures. Normal muscle tone.  Skin: no rashes, lesions, ulcers. No induration Neurologic: CN 2-12 grossly intact. Sensation intact, DTR normal.  Strength 5/5 in all 4.  Psychiatric: Normal judgment and insight. Alert and oriented x 3. Normal mood.   (Anything < 9 systems with 2 bullets each down codes to level 1) (If patient refuses exam can't bill higher level) (Make sure to document decubitus ulcers present on admission -- if possible -- and whether patient has chronic indwelling catheter at time of admission)  Labs on Admission: I have personally reviewed following labs and imaging studies  CBC: Recent Labs  Lab 04/07/19 1740 04/07/19 1836  WBC 3.8*  --   HGB 14.3 13.3  HCT 42.8 39.0  MCV 83.9  --  PLT 120*  --    Basic Metabolic Panel: Recent Labs  Lab 04/07/19 1740 04/07/19 1836  NA 138 139  K 4.2 4.2  CL 102 101  CO2 25  --   GLUCOSE 99 97  BUN 20 19  CREATININE 1.15 1.10  CALCIUM 8.9  --    GFR: Estimated Creatinine Clearance: 63.7 mL/min (by C-G formula based on SCr of 1.1 mg/dL). Liver Function Tests: Recent Labs  Lab 04/07/19 1836  AST 26  ALT 16  ALKPHOS 56  BILITOT 0.9  PROT 6.7  ALBUMIN 3.3*   No results for input(s): LIPASE, AMYLASE in the last 168 hours. No results for input(s): AMMONIA in the last 168 hours. Coagulation Profile: No results for input(s): INR, PROTIME in the last 168 hours. Cardiac Enzymes: No results for input(s): CKTOTAL, CKMB, CKMBINDEX, TROPONINI in the last 168 hours. BNP (last 3 results) No results for input(s): PROBNP in the last 8760 hours. HbA1C: No results for input(s): HGBA1C in the last 72 hours. CBG: No results for input(s): GLUCAP in the last 168 hours. Lipid Profile: No results for input(s): CHOL, HDL, LDLCALC, TRIG, CHOLHDL, LDLDIRECT in the last 72 hours. Thyroid Function Tests: No results for input(s): TSH, T4TOTAL, FREET4, T3FREE, THYROIDAB in the last 72 hours. Anemia Panel: No results for input(s): VITAMINB12, FOLATE, FERRITIN, TIBC, IRON, RETICCTPCT in the last 72 hours. Urine analysis:    Component Value Date/Time   COLORURINE YELLOW  06/24/2018 1853   APPEARANCEUR CLEAR 06/24/2018 1853   LABSPEC 1.010 06/24/2018 1853   PHURINE 5.0 06/24/2018 1853   GLUCOSEU NEGATIVE 06/24/2018 1853   HGBUR NEGATIVE 06/24/2018 1853   BILIRUBINUR NEGATIVE 06/24/2018 1853   KETONESUR NEGATIVE 06/24/2018 1853   PROTEINUR NEGATIVE 06/24/2018 1853   UROBILINOGEN 4.0 (H) 02/21/2008 1427   NITRITE NEGATIVE 06/24/2018 1853   LEUKOCYTESUR TRACE (A) 06/24/2018 1853    Radiological Exams on Admission: Ct Angio Chest Pe W And/or Wo Contrast  Result Date: 04/07/2019 CLINICAL DATA:  Shortness of breath for 3 days, dyspnea with exertion, RIGHT-side nonradiating chest pain rated 8/10, throbbing, normal sinus rhythm, former smoker EXAM: CT ANGIOGRAPHY CHEST WITH CONTRAST TECHNIQUE: Multidetector CT imaging of the chest was performed using the standard protocol during bolus administration of intravenous contrast. Multiplanar CT image reconstructions and MIPs were obtained to evaluate the vascular anatomy. CONTRAST:  167mL OMNIPAQUE IOHEXOL 350 MG/ML SOLN IV COMPARISON:  None FINDINGS: Cardiovascular: Atherosclerotic calcifications aorta, coronary arteries, and proximal great vessels. Ascending aorta upper normal caliber 3.8 cm diameter. Cardiac chambers minimally prominent. Pulmonary arteries well opacified within large mint of central pulmonary arteries question pulmonary arterial hypertension. Pulmonary arteries well opacified and patent. No evidence of pulmonary embolism. Mediastinum/Nodes: Esophagus normal appearance. Base of cervical region normal appearance. Axilla unremarkable. Normal size mediastinal lymph nodes including para-aortic, AP window, RIGHT paratracheal and BILATERAL hilar. No definite thoracic adenopathy. Lungs/Pleura: Patchy airspace infiltrates RIGHT upper and RIGHT lower lobes consistent with pneumonia. Minimal peripheral infiltrate in RIGHT middle and LEFT upper lobes. Atelectasis and questionable mild infiltrate LEFT lower lobe. No  pleural effusion or pneumothorax. Minimal peribronchial thickening. Upper Abdomen: Unremarkable Musculoskeletal: No acute osseous findings. Review of the MIP images confirms the above findings. IMPRESSION: No evidence of pulmonary embolism. Enlarged central pulmonary arteries question pulmonary arterial hypertension. BILATERAL pulmonary infiltrates consistent with pneumonia. Atelectasis and questionable infiltrate LEFT lower lobe. Aortic Atherosclerosis (ICD10-I70.0). Electronically Signed   By: Lavonia Dana M.D.   On: 04/07/2019 19:33    EKG: Independently reviewed.  Normal  sinus rhythm with nonspecific ST-T wave changes.  No acute changes noted.  Assessment/Plan Active Problems:   Viral pneumonia, unspecified   Parkinson's disease ([place])   REM sleep behavior disorder   Mixed restrictive and obstructive lung disease ([place])   Viral pneumonia  (please populate well all problems here in Problem List. (For example, if patient is on BP meds at home and you resume or decide to hold them, it is a problem that needs to be her. Same for CAD, COPD, HLD and so on)   1.  Viral pneumonia  -patient with chronic shortness of breath now worse than his baseline.  He is diagnosed with COPD and restrictive lung disease.  He admits to a cough productive of a whitish sputum.  He admits to rigors but no fever.  Abnormal auscultation the chest with decreased breath sounds at the left base and minor adventitious sounds throughout both lung fields.  Patient said dullness to percussion at the left lung base.   Plan admit to a regular MedSurg bed  No indication for antibiotics at this time with leukopenia and no productive sputum  Will monitor pulse oximetry at rest and with ambulation.  COVID-19 test pending with treatment decisions determined by results  2.  Parkinson's disease -patient does follow regularly with Dr. Carles Collet.  We will continue present medications.    DVT prophylaxis: Lovenox  (Lovenox/Heparin/SCD's/anticoagulated/None (if comfort care) Code Status: Full code (Full/Partial (specify details) Family Communication: Spoke with patient's daughter answered all her questions.  She agrees with plan.  Overnight or longer admission (Specify name, relationship. Do not write "discussed with patient". Specify tel # if discussed over the phone) Disposition Plan: Home when medically stable (specify when and where you expect patient to be discharged) Consults called: None (with names) Admission status: Inpatient (inpatient / obs / tele / medical floor / SDU)   Adella Hare MD [facility] Pager [phone]  If 7PM-7AM, please contact night-coverage [email or web address] Password [facility]  04/07/2019, 10:37 PM

## 2019-04-07 NOTE — ED Provider Notes (Signed)
[facility] EMERGENCY DEPARTMENT Provider Note   CSN: [id number] Arrival date & time: 04/07/19  1721     History   Chief Complaint Chief Complaint  Patient presents with  . Shortness of Breath    HPI Theodore King is a 73 y.o. male.  HPI   Patient has a past medical history of Parkinson's disease, hyperlipidemia, vertigo presenting today for concerns for shortness of breath for the last 2 days.  He normally is able to have a shuffling gait, but he reports that he is so dyspneic on exertion that he is unable to ambulate.  He states exertion and activity makes his shortness of breath worse.  He denies any chest pain to me despite triage chief complaint.  Associated symptoms include generalized weakness and a dry cough that has been present for 4 days.  Patient tried his inhaler at home to little relief.  Nothing seems to make his symptoms better.  Ineffective treatments include sitting in a darkened room and resting.  Patient is oriented to person, place, month, GCS 15, hemodynamically stable at this time.  He denies any fevers, COVID-19 exposures, nausea, emesis, abdominal pain.  Past Medical History:  Diagnosis Date  . Abnormal dreams 09/06/2013  . Disorders of bursae and tendons in shoulder region, unspecified   . Hyperglycemia 05/18/2013  . Hyperlipidemia LDL goal < 100 05/18/2013  . Other and unspecified hyperlipidemia   . Other and unspecified hyperlipidemia   . 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 Paralysis agitans ([place]) 05/18/2013  . Parkinson's disease ([place])   . 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 SOB (shortness of breath) 03/23/2018  . Unspecified vitamin D deficiency   . Unspecified vitamin D deficiency   . Vertigo, benign paroxysmal 11/05/2016  . Vitamin D deficiency 05/18/2013    Patient Active Problem List   Diagnosis Date Noted  . 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 Former smoker 08/15/2018   . Abnormal pulmonary function test 08/15/2018  . SOB (shortness of breath) 03/23/2018  . Vertigo, benign paroxysmal 11/05/2016  . Abnormal dreams 09/06/2013  . 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 Hyperglycemia 05/18/2013  . Paralysis agitans ([place]) 05/18/2013  . Vitamin D deficiency 05/18/2013  . Hyperlipidemia LDL goal < 100 05/18/2013  . Parkinson's disease ([place]) 10/26/2012    Past Surgical History:  Procedure Laterality Date  . APPENDECTOMY    . ROTATOR CUFF REPAIR     right        Home Medications    Prior to Admission medications   Medication Sig Start Date End Date Taking? Authorizing Provider  albuterol (VENTOLIN HFA) 108 (90 Base) MCG/ACT inhaler INHALE 2 PUFFS BY MOUTH 4 TIMES DAILY AS NEEDED 10/03/18   [provider]  carbidopa-levodopa (SINEMET CR) 50-200 MG tablet Take 1 tablet by mouth at bedtime. 02/23/19   Tat, Eustace Quail, DO  carbidopa-levodopa (SINEMET IR) 25-100 MG tablet TAKE 2 TABLETS BY MOUTH AT 7 AM , 2 TABLETS AT 10 AM , 2 TABLETS AT 2 PM, 1 TABLET AT 6 PM 02/26/19   Tat, Rebecca S, DO  entacapone (COMTAN) 200 MG tablet TAKE 1 TABLET BY MOUTH THREE TIMES DAILY TAKE  1  AT  7AM,  10AM,  2PM. 02/23/19   Tat, Eustace Quail, DO  ibuprofen (ADVIL,MOTRIN) 400 MG tablet Take 400 mg by mouth every 6 (six) hours as needed.    [provider]  meclizine (ANTIVERT) 25 MG tablet TAKE  1 TABLET BY MOUTH TWICE DAILY AS NEEDED FOR DIZZINESS 10/02/18   [provider]  metFORMIN (GLUCOPHAGE) 500 MG tablet Take 500 mg by mouth daily with breakfast.     [provider]  NUPLAZID 34 MG CAPS TAKE 1 CAPSULE BY MOUTH ONCE DAILY 08/28/18   Tat, Eustace Quail, DO    Family History Family History  Problem Relation Age of Onset  . Cancer Mother   . Cancer Father   . Stroke Brother   . Cancer Sister   . Healthy Daughter   . Cancer Sister   . Lung cancer Brother   . Healthy Daughter   . Colon cancer Neg Hx   . Esophageal cancer Neg Hx   . Rectal  cancer Neg Hx   . Stomach cancer Neg Hx     Social History Social History   Tobacco Use  . Smoking status: Former Smoker    Packs/day: 0.50    Years: 20.00    Pack years: 10.00    Start date: 08/14/1953    Quit date: 05/23/1974    Years since quitting: 44.9  . Smokeless tobacco: Never Used  Substance Use Topics  . Alcohol use: Not Currently  . Drug use: No     Allergies   Patient has no known allergies.   Review of Systems Review of Systems  Constitutional: Negative for fever.  HENT: Negative for ear pain.   Respiratory: Positive for cough and shortness of breath.   Cardiovascular: Negative for chest pain and palpitations.  Gastrointestinal: Negative for abdominal pain, nausea and vomiting.  Skin: Negative for rash and wound.  Neurological: Positive for weakness. Negative for syncope.  All other systems reviewed and are negative.    Physical Exam Updated Vital Signs BP (!) 155/74   Pulse 69   Temp (!) 97.4 F (36.3 C) (Oral)   Resp 20   Ht 6' (1.829 m)   Wt 93 kg   SpO2 98%   BMI 27.80 kg/m   Physical Exam Vitals signs and nursing note reviewed.  Constitutional:      Appearance: He is well-developed.  HENT:     Head: Normocephalic.  Eyes:     Conjunctiva/sclera: Conjunctivae normal.  Neck:     Musculoskeletal: Neck supple.  Cardiovascular:     Rate and Rhythm: Normal rate and regular rhythm.     Heart sounds: No murmur.  Pulmonary:     Effort: Tachypnea present. No respiratory distress.     Breath sounds: Normal breath sounds. No wheezing.     Comments: Satting well on room air Chest:     Chest wall: No tenderness.  Abdominal:     Palpations: Abdomen is soft.     Tenderness: There is no abdominal tenderness.  Musculoskeletal:     Comments: No lower extremity edema, erythema, posterior calf tenderness to palpation.  Skin:    General: Skin is warm and dry.     Capillary Refill: Capillary refill takes less than 2 seconds.  Neurological:      General: No focal deficit present.     Mental Status: He is alert and oriented to person, place, and time.     Motor: No weakness.     Comments: Generalized weakness throughout body, no focal deficit      ED Treatments / Results  Labs (all labs ordered are listed, but only abnormal results are displayed) Labs Reviewed  CBC - Abnormal; Notable for the following components:      Result  Value   WBC 3.8 (*)    Platelets 120 (*)    All other components within normal limits  HEPATIC FUNCTION PANEL - Abnormal; Notable for the following components:   Albumin 3.3 (*)    All other components within normal limits  SARS CORONAVIRUS 2 (TAT 6-24 HRS)  BASIC METABOLIC PANEL  LACTIC ACID, PLASMA  BRAIN NATRIURETIC PEPTIDE  LACTIC ACID, PLASMA  I-STAT CHEM 8, ED  TROPONIN I (HIGH SENSITIVITY)  TROPONIN I (HIGH SENSITIVITY)    EKG EKG Interpretation  Date/Time:  Saturday April 07 2019 17:39:06 EDT Ventricular Rate:  70 PR Interval:    QRS Duration: 100 QT Interval:  375 QTC Calculation: 405 R Axis:   35 Text Interpretation: Normal sinus rhythm Borderline ST elevation, anterior leads Nonspecific ST and T wave abnormality Confirmed by Varney Biles [phone]) on 04/07/2019 6:18:58 PM   Radiology Ct Angio Chest Pe W And/or Wo Contrast  Result Date: 04/07/2019 CLINICAL DATA:  Shortness of breath for 3 days, dyspnea with exertion, RIGHT-side nonradiating chest pain rated 8/10, throbbing, normal sinus rhythm, former smoker EXAM: CT ANGIOGRAPHY CHEST WITH CONTRAST TECHNIQUE: Multidetector CT imaging of the chest was performed using the standard protocol during bolus administration of intravenous contrast. Multiplanar CT image reconstructions and MIPs were obtained to evaluate the vascular anatomy. CONTRAST:  160mL OMNIPAQUE IOHEXOL 350 MG/ML SOLN IV COMPARISON:  None FINDINGS: Cardiovascular: Atherosclerotic calcifications aorta, coronary arteries, and proximal great vessels. Ascending aorta  upper normal caliber 3.8 cm diameter. Cardiac chambers minimally prominent. Pulmonary arteries well opacified within large mint of central pulmonary arteries question pulmonary arterial hypertension. Pulmonary arteries well opacified and patent. No evidence of pulmonary embolism. Mediastinum/Nodes: Esophagus normal appearance. Base of cervical region normal appearance. Axilla unremarkable. Normal size mediastinal lymph nodes including para-aortic, AP window, RIGHT paratracheal and BILATERAL hilar. No definite thoracic adenopathy. Lungs/Pleura: Patchy airspace infiltrates RIGHT upper and RIGHT lower lobes consistent with pneumonia. Minimal peripheral infiltrate in RIGHT middle and LEFT upper lobes. Atelectasis and questionable mild infiltrate LEFT lower lobe. No pleural effusion or pneumothorax. Minimal peribronchial thickening. Upper Abdomen: Unremarkable Musculoskeletal: No acute osseous findings. Review of the MIP images confirms the above findings. IMPRESSION: No evidence of pulmonary embolism. Enlarged central pulmonary arteries question pulmonary arterial hypertension. BILATERAL pulmonary infiltrates consistent with pneumonia. Atelectasis and questionable infiltrate LEFT lower lobe. Aortic Atherosclerosis (ICD10-I70.0). Electronically Signed   By: Lavonia Dana M.D.   On: 04/07/2019 19:33    Procedures Procedures (including critical care time)  Medications Ordered in ED Medications  iohexol (OMNIPAQUE) 350 MG/ML injection 100 mL (100 mLs Intravenous Contrast Given 04/07/19 1916)     Initial Impression / Assessment and Plan / ED Course  I have reviewed the triage vital signs and the nursing notes.  Pertinent labs & imaging results that were available during my care of the patient were reviewed by me and considered in my medical decision making (see chart for details).     HEAR Score: 4  Theodore King is a 73 y.o. male with a past medical history of Parkinson's disease, hyperlipidemia,  vertigo presenting today for concerns for shortness of breath for the last 2 days.  Patient denies any chest pain during my exam, EKG does not show STEMI and is unchanged from previous tracings.  Labs and imaging ordered for differential diagnosis of pneumonia, PE, sepsis, electrolyte abnormality.  Labs and CT of his chest resulted and showed SIRS (leukopenia and tachypnea) without signs of severe sepsis.  Delta troponin was negative,  no significant anemia, electrolyte abnormality present.  There is no endorgan damage at this time.    He likely has a viral infection.  CT scan showed likely pneumonia.  Covid swab pending.  Considering patient is unable to ambulate due to concerns for dyspnea on exertion, plan to admit to medicine for further evaluation and management.  Final Clinical Impressions(s) / ED Diagnoses   Final diagnoses:  SIRS (systemic inflammatory response syndrome) ([place])  Community acquired pneumonia, unspecified laterality    ED Discharge Orders    None       Julianne Rice, MD 04/07/19 [id number]    Varney Biles, MD 04/08/19 1949

## 2019-04-07 NOTE — ED Notes (Signed)
Pt ambulated in room without difficulty  

## 2019-04-07 NOTE — ED Notes (Signed)
Patient transported to CT 

## 2019-04-07 NOTE — H&P (Addendum)
History and Physical    Theodore King [id number] DOB: [birth year] DOA: 04/07/2019  PCP: [name], MD (Confirm with patient/family/NH records and if not entered, this has to be entered at [facility] point of entry) Patient coming from: Patient is coming from home  I have personally briefly reviewed patient's old medical records in [place]  Chief Complaint: Increasing shortness of breath and dyspnea on exertion  HPI: Theodore King is a 73 y.o. male with medical history significant of Parkinson's disease and chronic shortness of breath.  He is followed by Dr. Carles Collet for neurology and Dr. Loanne Drilling for pulmonary.  Patient was in his usual state of health until 24 to 36 hours prior to admission when he became increasingly short of breath with exertion.  He admits to having rigors but no fever.  He admits to having myalgias.  He admits to a dry cough which occasionally is productive of a whitish sputum.  [facility] because of his shortness of breath.    ED Course: Dynamically stable.  Operatory revealed the patient to be leukopenic and thrombocytopenic.  CT a of the chest revealed the patient to have patchy infiltrates in the right upper lobe, right lower lobe, right middle lobe.  He had increased infiltrate and atelectasis at the left base.  COVID-19 testing is pending [facility] was called to admit the patient because of his shortness of breath.  Review of Systems: As per HPI otherwise 10 point review of systems negative.   Past Medical History:  Diagnosis Date  . Abnormal dreams 09/06/2013  . Disorders of bursae and tendons in shoulder region, unspecified   . Hyperglycemia 05/18/2013  . Hyperlipidemia LDL goal < 100 05/18/2013  . Other and unspecified hyperlipidemia   . Other and unspecified hyperlipidemia   . 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 Paralysis agitans ([place]) 05/18/2013  . Parkinson's disease ([place])   . 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 SOB (shortness of breath) 03/23/2018  . Unspecified vitamin D deficiency   . Unspecified vitamin D deficiency   . Vertigo, benign paroxysmal 11/05/2016  . Vitamin D deficiency 05/18/2013    Past Surgical History:  Procedure Laterality Date  . APPENDECTOMY    . ROTATOR CUFF REPAIR     right   Social history -patient has been married twice and is currently single.  Has 2 daughters, 1 son deceased, grandchildren, and 4 great-grandchildren.  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  Patient lives with his daughter.   reports that he quit smoking about 44 years ago. He started smoking about 65 years ago. He has a 10.00 pack-year smoking history. He has never used smokeless tobacco. He reports previous alcohol use. He reports that he does not use drugs.  No Known Allergies  Family History  Problem Relation Age of Onset  . Cancer Mother   . Cancer Father   . Stroke Brother   . Cancer Sister   . Healthy Daughter   . Cancer Sister   . Lung cancer Brother   . Healthy Daughter   . Colon cancer Neg Hx   . Esophageal cancer Neg Hx   . Rectal cancer Neg Hx   . Stomach cancer Neg Hx    Unacceptable: Noncontributory, unremarkable, or negative. Acceptable: Family history reviewed and not pertinent (If you reviewed it)  Prior  to Admission medications   Medication Sig Start Date End Date Taking? Authorizing Provider  albuterol (VENTOLIN HFA) 108 (90 Base) MCG/ACT inhaler INHALE 2 PUFFS BY MOUTH 4 TIMES DAILY AS NEEDED 10/03/18   [provider]  carbidopa-levodopa (SINEMET CR) 50-200 MG tablet Take 1 tablet by mouth at bedtime. 02/23/19   Tat, Eustace Quail, DO  carbidopa-levodopa (SINEMET IR) 25-100 MG tablet TAKE 2 TABLETS BY MOUTH AT 7 AM , 2 TABLETS AT 10 AM , 2 TABLETS AT 2 PM, 1 TABLET AT 6 PM 02/26/19   Tat, Rebecca S, DO  entacapone (COMTAN) 200 MG tablet TAKE 1  TABLET BY MOUTH THREE TIMES DAILY TAKE  1  AT  7AM,  10AM,  2PM. 02/23/19   Tat, Eustace Quail, DO  ibuprofen (ADVIL,MOTRIN) 400 MG tablet Take 400 mg by mouth every 6 (six) hours as needed.    [provider]  meclizine (ANTIVERT) 25 MG tablet TAKE 1 TABLET BY MOUTH TWICE DAILY AS NEEDED FOR DIZZINESS 10/02/18   [provider]  metFORMIN (GLUCOPHAGE) 500 MG tablet Take 500 mg by mouth daily with breakfast.     [provider]  NUPLAZID 34 MG CAPS TAKE 1 CAPSULE BY MOUTH ONCE DAILY 08/28/18   Tat, Eustace Quail, DO    Physical Exam: Vitals:   04/07/19 1930 04/07/19 2000 04/07/19 2030 04/07/19 2100  BP: (!) 168/72 (!) 159/75 (!) 155/74 138/71  Pulse:   69 81  Resp: 20 20 20 18   Temp:      TempSrc:      SpO2:   98% 96%  Weight:      Height:        Constitutional: NAD, calm, comfortable Vitals:   04/07/19 1930 04/07/19 2000 04/07/19 2030 04/07/19 2100  BP: (!) 168/72 (!) 159/75 (!) 155/74 138/71  Pulse:   69 81  Resp: 20 20 20 18   Temp:      TempSrc:      SpO2:   98% 96%  Weight:      Height:       General appearance:   Well-nourished well-developed elderly man in no acute distress  eyes: PERRL, lids and conjunctivae normal, senilis bilaterally  ENMT: Mucous membranes are moist. Posterior pharynx clear of any exudate or lesions.  Missing many teeth.  Gingivitis is noted.  No carious teeth are noted..  Neck: normal, supple, no masses, no thyromegaly Respiratory: Mild increased work of breathing with use of abdominal musculature.  No neck retractions are noted.  On auscultation patient has bibasilar crackles.  Decreased breath sounds at the left base.  She has dullness to percussion of the left base.   Cardiovascular: Regular rate and rhythm, no murmurs / rubs / gallops. No extremity edema. 2+ pedal pulses. No carotid bruits.  Abdomen: no tenderness, no masses palpated. No hepatosplenomegaly. Bowel sounds positive.  Musculoskeletal: no clubbing / cyanosis. No joint  deformity upper and lower extremities. Good ROM, no contractures. Normal muscle tone.  Skin: no rashes, lesions, ulcers. No induration Neurologic: CN 2-12 grossly intact. Sensation intact,  Strength 5/5 in all 4.  Mild cogwheeling noted in the left upper extremity.  No tremor is appreciated.  Patient was not stood or ambulated. Psychiatric: Normal judgment and insight. Alert and oriented x 3. Normal mood.     Labs on Admission: I have personally reviewed following labs and imaging studies  CBC: Recent Labs  Lab 04/07/19 1740 04/07/19 1836  WBC 3.8*  --   HGB 14.3 13.3  HCT 42.8 39.0  MCV 83.9  --   PLT 120*  --    Basic Metabolic Panel: Recent Labs  Lab 04/07/19 1740 04/07/19 1836  NA 138 139  K 4.2 4.2  CL 102 101  CO2 25  --   GLUCOSE 99 97  BUN 20 19  CREATININE 1.15 1.10  CALCIUM 8.9  --    GFR: Estimated Creatinine Clearance: 63.7 mL/min (by C-G formula based on SCr of 1.1 mg/dL). Liver Function Tests: Recent Labs  Lab 04/07/19 1836  AST 26  ALT 16  ALKPHOS 56  BILITOT 0.9  PROT 6.7  ALBUMIN 3.3*   No results for input(s): LIPASE, AMYLASE in the last 168 hours. No results for input(s): AMMONIA in the last 168 hours. Coagulation Profile: No results for input(s): INR, PROTIME in the last 168 hours. Cardiac Enzymes: No results for input(s): CKTOTAL, CKMB, CKMBINDEX, TROPONINI in the last 168 hours. BNP (last 3 results) No results for input(s): PROBNP in the last 8760 hours. HbA1C: No results for input(s): HGBA1C in the last 72 hours. CBG: No results for input(s): GLUCAP in the last 168 hours. Lipid Profile: No results for input(s): CHOL, HDL, LDLCALC, TRIG, CHOLHDL, LDLDIRECT in the last 72 hours. Thyroid Function Tests: No results for input(s): TSH, T4TOTAL, FREET4, T3FREE, THYROIDAB in the last 72 hours. Anemia Panel: No results for input(s): VITAMINB12, FOLATE, FERRITIN, TIBC, IRON, RETICCTPCT in the last 72 hours. Urine analysis:    Component  Value Date/Time   COLORURINE YELLOW 06/24/2018 1853   APPEARANCEUR CLEAR 06/24/2018 1853   LABSPEC 1.010 06/24/2018 1853   PHURINE 5.0 06/24/2018 1853   GLUCOSEU NEGATIVE 06/24/2018 1853   HGBUR NEGATIVE 06/24/2018 1853   BILIRUBINUR NEGATIVE 06/24/2018 1853   KETONESUR NEGATIVE 06/24/2018 1853   PROTEINUR NEGATIVE 06/24/2018 1853   UROBILINOGEN 4.0 (H) 02/21/2008 1427   NITRITE NEGATIVE 06/24/2018 1853   LEUKOCYTESUR TRACE (A) 06/24/2018 1853    Radiological Exams on Admission: Ct Angio Chest Pe W And/or Wo Contrast  Result Date: 04/07/2019 CLINICAL DATA:  Shortness of breath for 3 days, dyspnea with exertion, RIGHT-side nonradiating chest pain rated 8/10, throbbing, normal sinus rhythm, former smoker EXAM: CT ANGIOGRAPHY CHEST WITH CONTRAST TECHNIQUE: Multidetector CT imaging of the chest was performed using the standard protocol during bolus administration of intravenous contrast. Multiplanar CT image reconstructions and MIPs were obtained to evaluate the vascular anatomy. CONTRAST:  146mL OMNIPAQUE IOHEXOL 350 MG/ML SOLN IV COMPARISON:  None FINDINGS: Cardiovascular: Atherosclerotic calcifications aorta, coronary arteries, and proximal great vessels. Ascending aorta upper normal caliber 3.8 cm diameter. Cardiac chambers minimally prominent. Pulmonary arteries well opacified within large mint of central pulmonary arteries question pulmonary arterial hypertension. Pulmonary arteries well opacified and patent. No evidence of pulmonary embolism. Mediastinum/Nodes: Esophagus normal appearance. Base of cervical region normal appearance. Axilla unremarkable. Normal size mediastinal lymph nodes including para-aortic, AP window, RIGHT paratracheal and BILATERAL hilar. No definite thoracic adenopathy. Lungs/Pleura: Patchy airspace infiltrates RIGHT upper and RIGHT lower lobes consistent with pneumonia. Minimal peripheral infiltrate in RIGHT middle and LEFT upper lobes. Atelectasis and questionable mild  infiltrate LEFT lower lobe. No pleural effusion or pneumothorax. Minimal peribronchial thickening. Upper Abdomen: Unremarkable Musculoskeletal: No acute osseous findings. Review of the MIP images confirms the above findings. IMPRESSION: No evidence of pulmonary embolism. Enlarged central pulmonary arteries question pulmonary arterial hypertension. BILATERAL pulmonary infiltrates consistent with pneumonia. Atelectasis and questionable infiltrate LEFT lower lobe. Aortic Atherosclerosis (ICD10-I70.0). Electronically Signed   By: Lavonia Dana M.D.   On:  04/07/2019 19:33    EKG: Independently reviewed.  Sinus rhythm is noted with mild ST-T wave abnormality.  No acute changes are noted  Assessment/Plan Active Problems:   Viral pneumonia, unspecified   Parkinson's disease ([place])   REM sleep behavior disorder   Mixed restrictive and obstructive lung disease ([place])   Viral pneumonia  (please populate well all problems here in Problem List. (For example, if patient is on BP meds at home and you resume or decide to hold them, it is a problem that needs to be her. Same for CAD, COPD, HLD and so on)   1.  Pneumonia -patient with patchy infiltrates diffusely increased at the left lower lobe as well as atelectasis.  Abnormal chest exam and lung exam is noted.  Patient is leukopenic.  No productive cough.  Picture is suggestive of a viral pneumonia.  He has normal oxygen saturation on room air at rest. Plan MedSurg admit  COVID-19 testing is pending.  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  Explained our diagnostic interpretation.   Agrees with the plan as above.  (Specify name, relationship. Do not write "discussed with patient". Specify tel # if discussed over the phone) Disposition Plan: Home when medically stable (specify when and where you expect patient to be discharged) Consults called: None (with names) Admission status: Inpatient (inpatient / obs / tele / medical floor / SDU)  Due to clinical suspicion of Covid 19 PNA full PPE precautions were taken: double gloved, gowned, N95 mask, surgical over mask and full eye protection.  Covid 19 test did return as positive.   Adella Hare MD [facility] Pager [phone]  If 7PM-7AM, please contact night-coverage [email or web address] Password [email or web address]  04/07/2019, 10:59 PM

## 2019-04-07 NOTE — ED Triage Notes (Signed)
Pt arrived via POV for c/o SOBx3 days, dyspnea w/exertion. Pt c/o right sided 8/10 non-radiating chest pain that's throbbing. Pt is NSR on monitor. VS WNL. Pt is A&Ox4.

## 2019-04-08 ENCOUNTER — Inpatient Hospital Stay (HOSPITAL_COMMUNITY): Payer: Medicare HMO

## 2019-04-08 ENCOUNTER — Encounter (HOSPITAL_COMMUNITY): Payer: Self-pay | Admitting: *Deleted

## 2019-04-08 DIAGNOSIS — U071 COVID-19: Secondary | ICD-10-CM

## 2019-04-08 DIAGNOSIS — G4752 REM sleep behavior disorder: Secondary | ICD-10-CM

## 2019-04-08 DIAGNOSIS — J1289 Other viral pneumonia: Secondary | ICD-10-CM

## 2019-04-08 DIAGNOSIS — N400 Enlarged prostate without lower urinary tract symptoms: Secondary | ICD-10-CM

## 2019-04-08 HISTORY — DX: COVID-19: U07.1

## 2019-04-08 LAB — CBC WITH DIFFERENTIAL/PLATELET
Abs Immature Granulocytes: 0.02 10*3/uL (ref 0.00–0.07)
Basophils Absolute: 0 10*3/uL (ref 0.0–0.1)
Basophils Relative: 0 %
Eosinophils Absolute: 0 10*3/uL (ref 0.0–0.5)
Eosinophils Relative: 0 %
HCT: 42.1 % (ref 39.0–52.0)
Hemoglobin: 13.8 g/dL (ref 13.0–17.0)
Immature Granulocytes: 1 %
Lymphocytes Relative: 16 %
Lymphs Abs: 0.6 10*3/uL — ABNORMAL LOW (ref 0.7–4.0)
MCH: 27.7 pg (ref 26.0–34.0)
MCHC: 32.8 g/dL (ref 30.0–36.0)
MCV: 84.4 fL (ref 80.0–100.0)
Monocytes Absolute: 0.3 10*3/uL (ref 0.1–1.0)
Monocytes Relative: 9 %
Neutro Abs: 2.7 10*3/uL (ref 1.7–7.7)
Neutrophils Relative %: 74 %
Platelets: 135 10*3/uL — ABNORMAL LOW (ref 150–400)
RBC: 4.99 MIL/uL (ref 4.22–5.81)
RDW: 12.3 % (ref 11.5–15.5)
WBC: 3.6 10*3/uL — ABNORMAL LOW (ref 4.0–10.5)
nRBC: 0 % (ref 0.0–0.2)

## 2019-04-08 LAB — PHOSPHORUS: Phosphorus: 2.8 mg/dL (ref 2.5–4.6)

## 2019-04-08 LAB — TROPONIN I (HIGH SENSITIVITY): Troponin I (High Sensitivity): 5 ng/L (ref <18)

## 2019-04-08 LAB — COMPREHENSIVE METABOLIC PANEL
ALT: 5 U/L (ref 0–44)
AST: 23 U/L (ref 15–41)
Albumin: 3.4 g/dL — ABNORMAL LOW (ref 3.5–5.0)
Alkaline Phosphatase: 55 U/L (ref 38–126)
Anion gap: 9 (ref 5–15)
BUN: 15 mg/dL (ref 8–23)
CO2: 27 mmol/L (ref 22–32)
Calcium: 8.7 mg/dL — ABNORMAL LOW (ref 8.9–10.3)
Chloride: 102 mmol/L (ref 98–111)
Creatinine, Ser: 0.96 mg/dL (ref 0.61–1.24)
GFR calc Af Amer: >60 mL/min (ref >60)
GFR calc non Af Amer: >60 mL/min (ref >60)
Glucose, Bld: 105 mg/dL — ABNORMAL HIGH (ref 70–99)
Potassium: 4 mmol/L (ref 3.5–5.1)
Sodium: 138 mmol/L (ref 135–145)
Total Bilirubin: 0.7 mg/dL (ref 0.3–1.2)
Total Protein: 7.1 g/dL (ref 6.5–8.1)

## 2019-04-08 LAB — LIPID PANEL
Cholesterol: 125 mg/dL (ref 0–200)
HDL: 32 mg/dL — ABNORMAL LOW (ref >40)
LDL Cholesterol: 81 mg/dL (ref 0–99)
Total CHOL/HDL Ratio: 3.9 RATIO
Triglycerides: 61 mg/dL (ref <150)
VLDL: 12 mg/dL (ref 0–40)

## 2019-04-08 LAB — D-DIMER, QUANTITATIVE: D-Dimer, Quant: 1.4 ug/mL-FEU — ABNORMAL HIGH (ref 0.00–0.50)

## 2019-04-08 LAB — HEMOGLOBIN A1C
Hgb A1c MFr Bld: 5.7 % — ABNORMAL HIGH (ref 4.8–5.6)
Mean Plasma Glucose: 116.89 mg/dL

## 2019-04-08 LAB — C-REACTIVE PROTEIN: CRP: 4.7 mg/dL — ABNORMAL HIGH (ref <1.0)

## 2019-04-08 LAB — LACTATE DEHYDROGENASE: LDH: 199 U/L — ABNORMAL HIGH (ref 98–192)

## 2019-04-08 LAB — ABO/RH: ABO/RH(D): O POS

## 2019-04-08 LAB — MAGNESIUM: Magnesium: 1.8 mg/dL (ref 1.7–2.4)

## 2019-04-08 LAB — HEPATITIS B SURFACE ANTIGEN: Hepatitis B Surface Ag: NONREACTIVE

## 2019-04-08 LAB — BRAIN NATRIURETIC PEPTIDE: B Natriuretic Peptide: 29.1 pg/mL (ref 0.0–100.0)

## 2019-04-08 LAB — FERRITIN: Ferritin: 219 ng/mL (ref 24–336)

## 2019-04-08 LAB — PROCALCITONIN: Procalcitonin: <0.1 ng/mL

## 2019-04-08 LAB — LACTIC ACID, PLASMA: Lactic Acid, Venous: 1 mmol/L (ref 0.5–1.9)

## 2019-04-08 LAB — SARS CORONAVIRUS 2 (TAT 6-24 HRS): SARS Coronavirus 2: POSITIVE — AB

## 2019-04-08 MED ORDER — CARBIDOPA-LEVODOPA ER 50-200 MG PO TBCR
1.0000 | EXTENDED_RELEASE_TABLET | Freq: Every day | ORAL | Status: DC
  Administered 2019-04-08 – 2019-04-09 (×2): 1 via ORAL
  Filled 2019-04-08 (×3): qty 1

## 2019-04-08 MED ORDER — ALBUTEROL SULFATE HFA 108 (90 BASE) MCG/ACT IN AERS
2.0000 | INHALATION_SPRAY | RESPIRATORY_TRACT | Status: DC | PRN
  Filled 2019-04-08: qty 6.7

## 2019-04-08 MED ORDER — IPRATROPIUM-ALBUTEROL 20-100 MCG/ACT IN AERS
1.0000 | INHALATION_SPRAY | Freq: Four times a day (QID) | RESPIRATORY_TRACT | Status: DC
  Administered 2019-04-08 – 2019-04-10 (×10): 1 via RESPIRATORY_TRACT
  Filled 2019-04-08: qty 4

## 2019-04-08 MED ORDER — VITAMIN C 500 MG PO TABS
500.0000 mg | ORAL_TABLET | Freq: Every day | ORAL | Status: DC
  Administered 2019-04-08 – 2019-04-10 (×3): 500 mg via ORAL
  Filled 2019-04-08 (×3): qty 1

## 2019-04-08 MED ORDER — ZINC SULFATE 220 (50 ZN) MG PO CAPS
220.0000 mg | ORAL_CAPSULE | Freq: Every day | ORAL | Status: DC
  Administered 2019-04-08 – 2019-04-10 (×3): 220 mg via ORAL
  Filled 2019-04-08 (×3): qty 1

## 2019-04-08 MED ORDER — TAMSULOSIN HCL 0.4 MG PO CAPS
0.4000 mg | ORAL_CAPSULE | Freq: Every day | ORAL | Status: DC
  Administered 2019-04-08 – 2019-04-10 (×3): 0.4 mg via ORAL
  Filled 2019-04-08 (×3): qty 1

## 2019-04-08 NOTE — ED Notes (Signed)
Notified [facility] of necklace

## 2019-04-08 NOTE — Progress Notes (Signed)
Called and spoke with his daughter [name]. I updated her on the patient's condition, lab values, vital signs, medications, and plan of care. I answered all of her questions and gave information regarding the discharge process. She denied any further concerns.

## 2019-04-08 NOTE — Progress Notes (Signed)
PROGRESS NOTE    Theodore King  [id number] DOB: 1945-08-18 DOA: 04/07/2019 PCP: [name], MD   Brief Narrative:  73 y.o. BM PMHx Parkinson's disease, other inflammatory and toxic neuropathy, chronic respiratory failure with hypoxia, prediabetes (hemoglobin A1c= 5.7),  HLD, benign paroxysmal vertigo, REM sleep behavior disorder, He is followed by Dr. Carles Collet for neurology and Dr. Loanne Drilling for pulmonary.    Patient was in his usual state of health until 24 to 36 hours prior to admission when he became increasingly short of breath with exertion.  He admits to having rigors but no fever.  He admits to having myalgias.  He admits to a dry cough which occasionally is productive of a whitish sputum.  He presents to [facility] ED because of his shortness of breath.    ED Course: Dynamically stable.  Operatory revealed the patient to be leukopenic and thrombocytopenic.  CT a of the chest revealed the patient to have patchy infiltrates in the right upper lobe, right lower lobe, right middle lobe.  He had increased infiltrate and atelectasis at the left base.  COVID-19 testing is pending [facility] was called to admit the patient because of his shortness of breath.   Subjective: A/O x4, negative S OB, negative CP, negative abdominal pain.  States went to ED because of fatigued.  Difficulty getting up to dress himself or ambulating.  States mailbox approximately 40 m down the driveway and would feel that he was going to pass out by the time he walked to the mailbox and back.  Currently on room air does not feel S OB.  Does not use oxygen at home.   Assessment & Plan:   Active Problems:   Parkinson's disease ([place])   REM sleep behavior disorder   Mixed restrictive and obstructive lung disease ([place])   Viral pneumonia, unspecified   Viral pneumonia   Pneumonia due to COVID-19 virus   Covid pneumonia -Currently on room air SPO2 98%.  Unsure why patient was excepted for admission.  Awaiting PT/OT  evaluation, patient may desat with exertion. -No Covid therapy indicated at this time -Ambulatory SPO2 pending -Patient aware that he will be most likely discharged in the a.m. barring finding any significant reason to keep him here in the work-up  COVID-19 Labs  Recent Labs    04/08/19 0835  FERRITIN 219  LDH 199*  CRP 4.7*    Lab Results  Component Value Date   SARSCOV2NAA POSITIVE (A) 04/07/2019    Mixed restrictive and obstructive lung disease -Per patient does not use home O2 or CPAP. -Uses inhaler PRN -Combivent QID   REM sleep behavior disorder  Parkinson's disease -Sinemet 50-200mg  QHS -Sinemet 25-100mg  @ 1800 -Sinemet 25-100 mg 2 tablets qac -Comtan 200 mgTID  BPH -Flomax 0.4 mg daily   Goals of care -11/1 PT/OT consult placed;Evaluate for CIR vs SNF    DVT prophylaxis: Lovenox Code Status: Full Family Communication: 11/1 spoke with [name] (daughter) counseled her on plan of care answered all questions Disposition Plan: TBD, most likely discharge in the a.m.   Consultants:  None  Procedures/Significant Events:  None   I have personally reviewed and interpreted all radiology studies and my findings are as above.  VENTILATOR SETTINGS: None   Cultures 10/31 SARS coronavirus positive   Antimicrobials: Anti-infectives (From admission, onward)   Start     Stop   04/07/19 2100  cefTRIAXone (ROCEPHIN) 1 g in sodium chloride 0.9 % 100 mL IVPB  Status:  Discontinued  04/07/19 2053   04/07/19 2100  azithromycin (ZITHROMAX) 500 mg in sodium chloride 0.9 % 250 mL IVPB  Status:  Discontinued     04/07/19 2053       Devices None   LINES / TUBES:  None    Continuous Infusions: . sodium chloride       Objective: Vitals:   04/07/19 2030 04/07/19 2100 04/08/19 0244 04/08/19 0749  BP: (!) 155/74 138/71 126/70 122/67  Pulse: 69 81 72 73  Resp: 20 18 (!) 21 18  Temp:   97.7 F (36.5 C) 98.3 F (36.8 C)  TempSrc:   Oral Oral  SpO2:  98% 96% 97% 98%  Weight:   89.8 kg   Height:   6\' 1"  (1.854 m)    No intake or output data in the 24 hours ending 04/08/19 0752 Filed Weights   04/07/19 1748 04/08/19 0244  Weight: 93 kg 89.8 kg    Examination:  General: A/O x4, no acute respiratory distress Eyes: negative scleral hemorrhage, negative anisocoria, negative icterus ENT: Negative Runny nose, negative gingival bleeding, Neck:  Negative scars, masses, torticollis, lymphadenopathy, JVD Lungs: Clear to auscultation bilaterally without wheezes or crackles Cardiovascular: Regular rate and rhythm without murmur gallop or rub normal S1 and S2 Abdomen: negative abdominal pain, nondistended, positive soft, bowel sounds, no rebound, no ascites, no appreciable mass Extremities: No significant cyanosis, clubbing, or edema bilateral lower extremities Skin: Negative rashes, lesions, ulcers Psychiatric:  Negative depression, negative anxiety, negative fatigue, negative mania  Central nervous system:  Cranial nerves II through XII intact, tongue/uvula midline, all extremities muscle strength 5/5, sensation intact throughout, negative dysarthria, negative expressive aphasia, negative receptive aphasia.  .     Data Reviewed: Care during the described time interval was provided by me .  I have reviewed this patient's available data, including medical history, events of note, physical examination, and all test results as part of my evaluation.   CBC: Recent Labs  Lab 04/07/19 1740 04/07/19 1836  WBC 3.8*  --   HGB 14.3 13.3  HCT 42.8 39.0  MCV 83.9  --   PLT 120*  --    Basic Metabolic Panel: Recent Labs  Lab 04/07/19 1740 04/07/19 1836  NA 138 139  K 4.2 4.2  CL 102 101  CO2 25  --   GLUCOSE 99 97  BUN 20 19  CREATININE 1.15 1.10  CALCIUM 8.9  --    GFR: Estimated Creatinine Clearance: 65.6 mL/min (by C-G formula based on SCr of 1.1 mg/dL). Liver Function Tests: Recent Labs  Lab 04/07/19 1836  AST 26  ALT 16   ALKPHOS 56  BILITOT 0.9  PROT 6.7  ALBUMIN 3.3*   No results for input(s): LIPASE, AMYLASE in the last 168 hours. No results for input(s): AMMONIA in the last 168 hours. Coagulation Profile: No results for input(s): INR, PROTIME in the last 168 hours. Cardiac Enzymes: No results for input(s): CKTOTAL, CKMB, CKMBINDEX, TROPONINI in the last 168 hours. BNP (last 3 results) No results for input(s): PROBNP in the last 8760 hours. HbA1C: No results for input(s): HGBA1C in the last 72 hours. CBG: No results for input(s): GLUCAP in the last 168 hours. Lipid Profile: No results for input(s): CHOL, HDL, LDLCALC, TRIG, CHOLHDL, LDLDIRECT in the last 72 hours. Thyroid Function Tests: No results for input(s): TSH, T4TOTAL, FREET4, T3FREE, THYROIDAB in the last 72 hours. Anemia Panel: No results for input(s): VITAMINB12, FOLATE, FERRITIN, TIBC, IRON, RETICCTPCT in the last 72 hours.  Urine analysis:    Component Value Date/Time   COLORURINE YELLOW 06/24/2018 1853   APPEARANCEUR CLEAR 06/24/2018 1853   LABSPEC 1.010 06/24/2018 1853   PHURINE 5.0 06/24/2018 1853   GLUCOSEU NEGATIVE 06/24/2018 1853   HGBUR NEGATIVE 06/24/2018 1853   BILIRUBINUR NEGATIVE 06/24/2018 1853   KETONESUR NEGATIVE 06/24/2018 1853   PROTEINUR NEGATIVE 06/24/2018 1853   UROBILINOGEN 4.0 (H) 02/21/2008 1427   NITRITE NEGATIVE 06/24/2018 1853   LEUKOCYTESUR TRACE (A) 06/24/2018 1853   Sepsis Labs: @LABRCNTIP (procalcitonin:4,lacticidven:4)  ) Recent Results (from the past 240 hour(s))  SARS CORONAVIRUS 2 (TAT 6-24 HRS) Nasopharyngeal Nasopharyngeal Swab     Status: Abnormal   Collection Time: 04/07/19  6:30 PM   Specimen: Nasopharyngeal Swab  Result Value Ref Range Status   SARS Coronavirus 2 POSITIVE (A) NEGATIVE Final    Comment: RESULT CALLED TO, READ BACK BY AND VERIFIED WITH: Billie Lade [id number] 04/08/2019 T. TYSOR (NOTE) SARS-CoV-2 target nucleic acids are DETECTED. The SARS-CoV-2 RNA is generally  detectable in upper and lower respiratory specimens during the acute phase of infection. Positive results are indicative of active infection with SARS-CoV-2. Clinical  correlation with patient history and other diagnostic information is necessary to determine patient infection status. Positive results do  not rule out bacterial infection or co-infection with other viruses. The expected result is Negative. Fact Sheet for Patients: SugarRoll.be Fact Sheet for Healthcare Providers: https://www.woods-mathews.com/ This test is not yet approved or cleared by the [place] FDA and  has been authorized for detection and/or diagnosis of SARS-CoV-2 by FDA under an Emergency Use Authorization (EUA). This EUA will remain  in effect (meaning this test can be used)  for the duration of the COVID-19 declaration under Section 564(b)(1) of the Act, 21 U.S.C. section 360bbb-3(b)(1), unless the authorization is terminated or revoked sooner. Performed at [facility], [place] [address], [place], [place] [address]          Radiology Studies: Ct Angio Chest Pe W And/or Wo Contrast  Result Date: 04/07/2019 CLINICAL DATA:  Shortness of breath for 3 days, dyspnea with exertion, RIGHT-side nonradiating chest pain rated 8/10, throbbing, normal sinus rhythm, former smoker EXAM: CT ANGIOGRAPHY CHEST WITH CONTRAST TECHNIQUE: Multidetector CT imaging of the chest was performed using the standard protocol during bolus administration of intravenous contrast. Multiplanar CT image reconstructions and MIPs were obtained to evaluate the vascular anatomy. CONTRAST:  126mL OMNIPAQUE IOHEXOL 350 MG/ML SOLN IV COMPARISON:  None FINDINGS: Cardiovascular: Atherosclerotic calcifications aorta, coronary arteries, and proximal great vessels. Ascending aorta upper normal caliber 3.8 cm diameter. Cardiac chambers minimally prominent. Pulmonary arteries well opacified within large mint  of central pulmonary arteries question pulmonary arterial hypertension. Pulmonary arteries well opacified and patent. No evidence of pulmonary embolism. Mediastinum/Nodes: Esophagus normal appearance. Base of cervical region normal appearance. Axilla unremarkable. Normal size mediastinal lymph nodes including para-aortic, AP window, RIGHT paratracheal and BILATERAL hilar. No definite thoracic adenopathy. Lungs/Pleura: Patchy airspace infiltrates RIGHT upper and RIGHT lower lobes consistent with pneumonia. Minimal peripheral infiltrate in RIGHT middle and LEFT upper lobes. Atelectasis and questionable mild infiltrate LEFT lower lobe. No pleural effusion or pneumothorax. Minimal peribronchial thickening. Upper Abdomen: Unremarkable Musculoskeletal: No acute osseous findings. Review of the MIP images confirms the above findings. IMPRESSION: No evidence of pulmonary embolism. Enlarged central pulmonary arteries question pulmonary arterial hypertension. BILATERAL pulmonary infiltrates consistent with pneumonia. Atelectasis and questionable infiltrate LEFT lower lobe. Aortic Atherosclerosis (ICD10-I70.0). Electronically Signed   By: Lavonia Dana M.D.   On:  04/07/2019 19:33        Scheduled Meds: . carbidopa-levodopa  1 tablet Oral q1800  . carbidopa-levodopa  2 tablet Oral 3 times per day  . enoxaparin (LOVENOX) injection  40 mg Subcutaneous Daily  . entacapone  200 mg Oral 3 times per day  . metFORMIN  500 mg Oral Q breakfast  . Pimavanserin Tartrate  1 capsule Oral Daily  . sodium chloride flush  3 mL Intravenous Q12H   Continuous Infusions: . sodium chloride       LOS: 1 day   The patient is critically ill with multiple organ systems failure and requires high complexity decision making for assessment and support, frequent evaluation and titration of therapies, application of advanced monitoring technologies and extensive interpretation of multiple databases. Critical Care Time devoted to patient  care services described in this note  Time spent: 40 minutes     WOODS, Geraldo Docker, MD [facility] Pager [phone]  If 7PM-7AM, please contact night-coverage [email or web address] Password [email or web address] 04/08/2019, 7:52 AM

## 2019-04-08 NOTE — Plan of Care (Signed)

## 2019-04-08 NOTE — ED Notes (Signed)
Called [facility] for transportation to [facility]

## 2019-04-08 NOTE — ED Notes (Signed)
O[place] pts daughter

## 2019-04-08 NOTE — ED Notes (Signed)
Pt has necklace that was left at [facility], locked up with security at [facility]

## 2019-04-09 DIAGNOSIS — R5383 Other fatigue: Secondary | ICD-10-CM

## 2019-04-09 LAB — CBC WITH DIFFERENTIAL/PLATELET
Abs Immature Granulocytes: 0.02 10*3/uL (ref 0.00–0.07)
Basophils Absolute: 0 10*3/uL (ref 0.0–0.1)
Basophils Relative: 0 %
Eosinophils Absolute: 0 10*3/uL (ref 0.0–0.5)
Eosinophils Relative: 0 %
HCT: 40.1 % (ref 39.0–52.0)
Hemoglobin: 13.2 g/dL (ref 13.0–17.0)
Immature Granulocytes: 1 %
Lymphocytes Relative: 26 %
Lymphs Abs: 0.8 10*3/uL (ref 0.7–4.0)
MCH: 27.4 pg (ref 26.0–34.0)
MCHC: 32.9 g/dL (ref 30.0–36.0)
MCV: 83.4 fL (ref 80.0–100.0)
Monocytes Absolute: 0.3 10*3/uL (ref 0.1–1.0)
Monocytes Relative: 11 %
Neutro Abs: 1.8 10*3/uL (ref 1.7–7.7)
Neutrophils Relative %: 62 %
Platelets: 129 10*3/uL — ABNORMAL LOW (ref 150–400)
RBC: 4.81 MIL/uL (ref 4.22–5.81)
RDW: 12.4 % (ref 11.5–15.5)
WBC: 3 10*3/uL — ABNORMAL LOW (ref 4.0–10.5)
nRBC: 0 % (ref 0.0–0.2)

## 2019-04-09 LAB — MAGNESIUM: Magnesium: 1.7 mg/dL (ref 1.7–2.4)

## 2019-04-09 LAB — COMPREHENSIVE METABOLIC PANEL
ALT: 8 U/L (ref 0–44)
AST: 20 U/L (ref 15–41)
Albumin: 3.3 g/dL — ABNORMAL LOW (ref 3.5–5.0)
Alkaline Phosphatase: 53 U/L (ref 38–126)
Anion gap: 11 (ref 5–15)
BUN: 17 mg/dL (ref 8–23)
CO2: 24 mmol/L (ref 22–32)
Calcium: 8.7 mg/dL — ABNORMAL LOW (ref 8.9–10.3)
Chloride: 101 mmol/L (ref 98–111)
Creatinine, Ser: 0.93 mg/dL (ref 0.61–1.24)
GFR calc Af Amer: >60 mL/min (ref >60)
GFR calc non Af Amer: >60 mL/min (ref >60)
Glucose, Bld: 81 mg/dL (ref 70–99)
Potassium: 3.6 mmol/L (ref 3.5–5.1)
Sodium: 136 mmol/L (ref 135–145)
Total Bilirubin: 1.1 mg/dL (ref 0.3–1.2)
Total Protein: 6.6 g/dL (ref 6.5–8.1)

## 2019-04-09 LAB — FERRITIN: Ferritin: 208 ng/mL (ref 24–336)

## 2019-04-09 LAB — PROCALCITONIN: Procalcitonin: <0.1 ng/mL

## 2019-04-09 LAB — PHOSPHORUS: Phosphorus: 2.7 mg/dL (ref 2.5–4.6)

## 2019-04-09 LAB — C-REACTIVE PROTEIN: CRP: 4.6 mg/dL — ABNORMAL HIGH (ref <1.0)

## 2019-04-09 LAB — D-DIMER, QUANTITATIVE: D-Dimer, Quant: 0.94 ug/mL-FEU — ABNORMAL HIGH (ref 0.00–0.50)

## 2019-04-09 NOTE — Plan of Care (Signed)

## 2019-04-09 NOTE — Care Management (Addendum)
Spoke w daughter [name] briefly, she was going into appointment and said she would call me back. Tried calling back, went to VM.

## 2019-04-09 NOTE — TOC Initial Note (Signed)
Transition of Care [facility]) - Initial/Assessment Note    Patient Details  Name: Theodore King MRN: [id number] Date of Birth: 1945/09/21  [facility] CM/SW Contact:    Carles Collet, RN Phone Number: 04/09/2019, 3:15 PM  Clinical Narrative:       Damaris Schooner w patient's daughter over the phone. SHe stated that patient used [facility] several years ago. Discussed insurance coverage and participating providers and she would like to use [facility]. Referral accepted by [facility]. Daughter states he has all needed DME at home. Discussed things she could do to prevent transmission in the house. She is eager to get her dad back home.             Expected Discharge Plan: [place] Barriers to Discharge: Continued Medical Work up   Patient Goals and CMS Choice Patient states their goals for this hospitalization and ongoing recovery are:: to go home CMS Medicare.gov Compare Post Acute Care list provided to:: Other (Comment Required) Choice offered to / list presented to : Adult Children(verbally discussed ratings)  Expected Discharge Plan and Services Expected Discharge Plan: [place] Choice: Home Health                                        Prior Living Arrangements/Services                       Activities of Daily Living Home Assistive Devices/Equipment: Cane (specify quad or straight) ADL Screening (condition at time of admission) Patient's cognitive ability adequate to safely complete daily activities?: Yes Is the patient deaf or have difficulty hearing?: No Does the patient have difficulty seeing, even when wearing glasses/contacts?: No Does the patient have difficulty concentrating, remembering, or making decisions?: No Patient able to express need for assistance with ADLs?: No Does the patient have difficulty dressing or bathing?: No Independently performs ADLs?: No Communication: Independent Dressing (OT):  Needs assistance Is this a change from baseline?: Change from baseline, expected to last <3days Grooming: Needs assistance Is this a change from baseline?: Change from baseline, expected to last <3 days Feeding: Independent Bathing: Needs assistance Is this a change from baseline?: Change from baseline, expected to last <3 days Toileting: Needs assistance Is this a change from baseline?: Change from baseline, expected to last <3 days In/Out Bed: Needs assistance Is this a change from baseline?: Change from baseline, expected to last <3 days Walks in Home: Independent with device (comment) Does the patient have difficulty walking or climbing stairs?: No(Weakness d/t illness) Weakness of Legs: Both Weakness of Arms/Hands: None  Permission Sought/Granted                  Emotional Assessment              Admission diagnosis:  SIRS (systemic inflammatory response syndrome) ([place]) [R65.10] Viral pneumonia [J12.9] Community acquired pneumonia, unspecified laterality [J18.9] Pneumonia due to COVID-19 virus [U07.1, J12.89] Patient Active Problem List   Diagnosis Date Noted  . Pneumonia due to COVID-19 virus 04/08/2019  . Viral pneumonia, unspecified 04/07/2019  . Viral pneumonia 04/07/2019  . Mixed restrictive and obstructive lung disease ([place]) 08/15/2018  . Former smoker 08/15/2018  . Abnormal pulmonary function test 08/15/2018  . SOB (shortness of breath) 03/23/2018  . Vertigo, benign paroxysmal 11/05/2016  . Abnormal  dreams 09/06/2013  . 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 Hyperglycemia 05/18/2013  . Paralysis agitans ([place]) 05/18/2013  . Vitamin D deficiency 05/18/2013  . Hyperlipidemia LDL goal < 100 05/18/2013  . Parkinson's disease ([place]) 10/26/2012   PCP:  [name], MD Pharmacy:   [facility] [place] [place] - [address] [place] ([place]) [place] [address] Phone: [phone] Fax:  [phone]     Social Determinants of Health (SDOH) Interventions    Readmission Risk Interventions No flowsheet data found.

## 2019-04-09 NOTE — Progress Notes (Signed)
OT Evaluation  PTA, pt lived at home with his daughter and was modified independent with mobility @ RW level. Daughter assisted with ADL as needed, however pt was able to bath and dress himself. Pt completed ADL session after ambulating in hallway with PT with SpO2 remaining @ 97 on RA. 1/4 DOE. Pt states he feels much better. Recommend HHOT for safety eval and to reduce risk of falls. Will follow acutely to facilitate safe DC home. Pt very appreciative.     04/09/19 1300  OT Visit Information  Last OT Received On 04/09/19  Assistance Needed +1  History of Present Illness Pt is a 73 y.o. male admitted 04/07/19 with DOE, myalgias and productive cough. (+) COVID-19. PMH includes Parkinson's disease, neuropathy, chronic respiratory failure, benign paroxysmal vertigo.  Precautions  Precautions Fall  Home Living  Family/patient expects to be discharged to: Private residence  Living Arrangements Children  Available Help at Discharge Family;Available PRN/intermittently  Type of Home Apartment  Home Access Stairs to enter  Entrance Stairs-Number of Steps 5  Entrance Stairs-Rails Right  Home Layout One level  Bathroom Shower/Tub Tub/shower unit;Curtain  Corporate treasurer Yes  How Accessible Accessible via walker  [place] - 2 wheels;Cane - single point;Grab bars - tub/shower;Shower seat  Prior Function  Level of Independence Needs assistance  Gait / Transfers Assistance Needed Mod indep with RW indoors, SPC outdoors; reports he only needs help from daughter to get out of bed in the morning. Drives sometimes. Enjoys bowling, fishing  ADL's / Engineer, maintenance for showers. 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 Needed expressive difficulties - at baseline  Pain Assessment  Pain Assessment No/denies pain  Cognition  Arousal/Alertness  Awake/alert  Behavior During Therapy WFL for tasks assessed/performed  Overall Cognitive Status No family/caregiver present to determine baseline cognitive functioning (most likely baseline)  Current Attention Level Selective  Following Commands Follows one step commands with increased time  Problem Solving Slow processing  Upper Extremity Assessment  Upper Extremity Assessment Generalized weakness  Lower Extremity Assessment  Lower Extremity Assessment Defer to PT evaluation  Cervical / Trunk Assessment  Cervical / Trunk Assessment Normal  ADL  Overall ADL's  Needs assistance/impaired  Grooming Standing;Supervision/safety  Upper Body Bathing Set up;Standing;Supervision/ safety  Lower Body Bathing Supervison/ safety;Set up;Sit to/from stand  Upper Body Dressing  Supervision/safety;Set up;Sitting  Lower Body Dressing Supervision/safety;Set up;Sit to/from Retail buyer Supervision/safety;RW;Comfort height toilet;Ambulation;Grab bars  Toileting- Clothing Manipulation and Hygiene Supervision/safety;Sit to/from stand  Functional mobility during ADLs Supervision/safety;Rolling walker  General ADL Comments SpO2 97. 1/4 DOE  Vision- History  Baseline Vision/History Wears glasses  Bed Mobility  General bed mobility comments OOB in chair. States his daughter helps him get in/out of bed. May benefit form bed rails  Transfers  Overall transfer level Needs assistance  Equipment used Rolling walker (2 wheeled)  Transfers Sit to/from Stand  Sit to Stand Supervision  Balance  Overall balance assessment Needs assistance  Sitting-balance support Feet supported  Sitting balance-Leahy Scale Good  Standing balance-Leahy Scale Fair  Exercises  Exercises Other exercises  Other Exercises  Other Exercises flutter valve x 10  Other Exercises incentive spirometer x 10  OT - End of Session  Activity Tolerance Patient tolerated treatment well  Patient left in chair;with call bell/phone  within reach;with chair alarm set  Nurse Communication Mobility status  OT Assessment  OT Recommendation/Assessment Patient needs continued OT Services  OT Visit Diagnosis Unsteadiness on feet (R26.81);Muscle weakness (generalized) (M62.81)  OT Problem List Decreased activity tolerance;Decreased strength;Cardiopulmonary status limiting activity  OT Plan  OT Frequency (ACUTE ONLY) Min 2X/week  OT Treatment/Interventions (ACUTE ONLY) Self-care/ADL training;Therapeutic exercise;Neuromuscular education;DME and/or AE instruction;Therapeutic activities;Patient/family education;Balance training  AM-PAC OT "6 Clicks" Daily Activity Outcome Measure (Version 2)  Help from another person eating meals? 4  Help from another person taking care of personal grooming? 3  Help from another person toileting, which includes using toliet, bedpan, or urinal? 3  Help from another person bathing (including washing, rinsing, drying)? 3  Help from another person to put on and taking off regular upper body clothing? 3  Help from another person to put on and taking off regular lower body clothing? 3  6 Click Score 19  OT Recommendation  Follow Up Recommendations Home health OT;Supervision - Intermittent  OT Equipment None recommended by OT  Individuals Consulted  Consulted and Agree with Results and Recommendations Patient  Acute Rehab OT Goals  Patient Stated Goal Return home  OT Goal Formulation With patient  Time For Goal Achievement 04/23/19  Potential to Achieve Goals Good  OT Time Calculation  OT Start Time (ACUTE ONLY) 0933  OT Stop Time (ACUTE ONLY) 1013  OT Time Calculation (min) 40 min  OT General Charges  $OT Visit 1 Visit  OT Evaluation  $OT Eval Moderate Complexity 1 Mod  OT Treatments  $Self Care/Home Management  23-37 mins  Written Expression  Dominant Hand Right (Uses both)  Maurie Boettcher, OT/L   Acute OT Clinical Specialist [place] Pager [phone] Office  [phone]

## 2019-04-09 NOTE — Progress Notes (Signed)
Called and spoke with the daughter [name]. I updated her on the plan of care, vitals, and discharge instructions/ recommendations. I answered all of her questions, reviewing the need for 14 day quarantine after discharge. She denies any other needs or concerns at this time.

## 2019-04-09 NOTE — Progress Notes (Signed)
PROGRESS NOTE    Theodore King  [id number] DOB: 1946/03/04 DOA: 04/07/2019 PCP: [name], MD   Brief Narrative:  73 y.o. BM PMHx Parkinson's disease, other inflammatory and toxic neuropathy, chronic respiratory failure with hypoxia, prediabetes (hemoglobin A1c= 5.7),  HLD, benign paroxysmal vertigo, REM sleep behavior disorder, He is followed by Dr. Carles Collet for neurology and Dr. Loanne Drilling for pulmonary.    Patient was in his usual state of health until 24 to 36 hours prior to admission when he became increasingly short of breath with exertion.  He admits to having rigors but no fever.  He admits to having myalgias.  He admits to a dry cough which occasionally is productive of a whitish sputum.  He presents to [facility] ED because of his shortness of breath.    ED Course: Dynamically stable.  Operatory revealed the patient to be leukopenic and thrombocytopenic.  CT a of the chest revealed the patient to have patchy infiltrates in the right upper lobe, right lower lobe, right middle lobe.  He had increased infiltrate and atelectasis at the left base.  COVID-19 testing is pending [facility] was called to admit the patient because of his shortness of breath.   Subjective: 11/2 A/O x4, negative S OB, negative CP, negative abdominal pain.  Currently on room air does not feel S OB.  Positive continued fatigue.    Assessment & Plan:   Active Problems:   Parkinson's disease ([place])   REM sleep behavior disorder   Mixed restrictive and obstructive lung disease ([place])   Viral pneumonia, unspecified   Viral pneumonia   Pneumonia due to COVID-19 virus   Covid pneumonia -Currently on room air SPO2 98%.  Unsure why patient was excepted for admission.  Awaiting PT/OT evaluation, patient may desat with exertion. -No Covid therapy indicated at this time -Ambulatory SPO2 pending -Patient aware that he will be most likely discharged in the a.m. barring finding any significant reason to keep him here  in the work-up  COVID-19 Labs  Recent Labs    04/08/19 0835 04/09/19 0539  DDIMER 1.40* 0.94*  FERRITIN 219 208  LDH 199*  --   CRP 4.7* 4.6*    Lab Results  Component Value Date   SARSCOV2NAA POSITIVE (A) 04/07/2019    Mixed restrictive and obstructive lung disease -Per patient does not use home O2 or CPAP. -Uses inhaler PRN -Combivent QID   REM sleep behavior disorder  Parkinson's disease -Sinemet 50-200mg  QHS -Sinemet 25-100mg  @ 1800 -Sinemet 25-100 mg 2 tablets qac -Comtan 200 mgTID  BPH -Flomax 0.4 mg daily   Goals of care -11/1 PT/OT; recommend home health.  Consult placed on 11/2    DVT prophylaxis: Lovenox Code Status: Full Family Communication: 11/1 spoke with [name] (daughter) counseled her on plan of care answered all questions Disposition Plan: TBD, most likely discharge in the a.m.   Consultants:  None  Procedures/Significant Events:  None   I have personally reviewed and interpreted all radiology studies and my findings are as above.  VENTILATOR SETTINGS: None   Cultures 10/31 SARS coronavirus positive   Antimicrobials: Anti-infectives (From admission, onward)   Start     Stop   04/07/19 2100  cefTRIAXone (ROCEPHIN) 1 g in sodium chloride 0.9 % 100 mL IVPB  Status:  Discontinued     04/07/19 2053   04/07/19 2100  azithromycin (ZITHROMAX) 500 mg in sodium chloride 0.9 % 250 mL IVPB  Status:  Discontinued     04/07/19 2053  Devices None   LINES / TUBES:  None    Continuous Infusions: . sodium chloride       Objective: Vitals:   04/08/19 1602 04/08/19 1926 04/09/19 0412 04/09/19 0900  BP: (!) 111/59 116/62 121/65 119/63  Pulse: 76 70 66 72  Resp: 18 19 18 18   Temp: 98.2 F (36.8 C) 97.9 F (36.6 C) 97.8 F (36.6 C) 97.9 F (36.6 C)  TempSrc: Oral Oral Oral Oral  SpO2: 96% 98% 99% 98%  Weight:      Height:        Intake/Output Summary (Last 24 hours) at 04/09/2019 1439 Last data filed at 04/09/2019  0700 Gross per 24 hour  Intake -  Output 1000 ml  Net -1000 ml   Filed Weights   04/07/19 1748 04/08/19 0244  Weight: 93 kg 89.8 kg   Physical Exam:  General: A/O x4 no acute respiratory distress Eyes: negative scleral hemorrhage, negative anisocoria, negative icterus ENT: Negative Runny nose, negative gingival bleeding, Neck:  Negative scars, masses, torticollis, lymphadenopathy, JVD Lungs: Clear to auscultation bilaterally without wheezes or crackles Cardiovascular: Regular rate and rhythm without murmur gallop or rub normal S1 and S2 Abdomen: negative abdominal pain, nondistended, positive soft, bowel sounds, no rebound, no ascites, no appreciable mass Extremities: No significant cyanosis, clubbing, or edema bilateral lower extremities Skin: Negative rashes, lesions, ulcers Psychiatric:  Negative depression, negative anxiety, negative fatigue, negative mania  Central nervous system:  Cranial nerves II through XII intact, tongue/uvula midline, all extremities muscle strength 5/5, sensation intact throughout, negative dysarthria, negative expressive aphasia, negative receptive aphasia.  .     Data Reviewed: Care during the described time interval was provided by me .  I have reviewed this patient's available data, including medical history, events of note, physical examination, and all test results as part of my evaluation.   CBC: Recent Labs  Lab 04/07/19 1740 04/07/19 1836 04/08/19 0835 04/09/19 0539  WBC 3.8*  --  3.6* 3.0*  NEUTROABS  --   --  2.7 1.8  HGB 14.3 13.3 13.8 13.2  HCT 42.8 39.0 42.1 40.1  MCV 83.9  --  84.4 83.4  PLT 120*  --  135* [id number]*   Basic Metabolic Panel: Recent Labs  Lab 04/07/19 1740 04/07/19 1836 04/08/19 0835 04/09/19 0539  NA 138 139 138 136  K 4.2 4.2 4.0 3.6  CL 102 101 102 101  CO2 25  --  27 24  GLUCOSE 99 97 105* 81  BUN 20 19 15 17   CREATININE 1.15 1.10 0.96 0.93  CALCIUM 8.9  --  8.7* 8.7*  MG  --   --  1.8 1.7  PHOS  --    --  2.8 2.7   GFR: Estimated Creatinine Clearance: 77.6 mL/min (by C-G formula based on SCr of 0.93 mg/dL). Liver Function Tests: Recent Labs  Lab 04/07/19 1836 04/08/19 0835 04/09/19 0539  AST 26 23 20   ALT 16 5 8   ALKPHOS 56 55 53  BILITOT 0.9 0.7 1.1  PROT 6.7 7.1 6.6  ALBUMIN 3.3* 3.4* 3.3*   No results for input(s): LIPASE, AMYLASE in the last 168 hours. No results for input(s): AMMONIA in the last 168 hours. Coagulation Profile: No results for input(s): INR, PROTIME in the last 168 hours. Cardiac Enzymes: No results for input(s): CKTOTAL, CKMB, CKMBINDEX, TROPONINI in the last 168 hours. BNP (last 3 results) No results for input(s): PROBNP in the last 8760 hours. HbA1C: Recent Labs    04/08/19  0835  HGBA1C 5.7*   CBG: No results for input(s): GLUCAP in the last 168 hours. Lipid Profile: Recent Labs    04/08/19 0835  CHOL 125  HDL 32*  LDLCALC 81  TRIG 61  CHOLHDL 3.9   Thyroid Function Tests: No results for input(s): TSH, T4TOTAL, FREET4, T3FREE, THYROIDAB in the last 72 hours. Anemia Panel: Recent Labs    04/08/19 0835 04/09/19 0539  FERRITIN 219 208   Urine analysis:    Component Value Date/Time   COLORURINE YELLOW 06/24/2018 1853   APPEARANCEUR CLEAR 06/24/2018 1853   LABSPEC 1.010 06/24/2018 1853   PHURINE 5.0 06/24/2018 1853   GLUCOSEU NEGATIVE 06/24/2018 1853   HGBUR NEGATIVE 06/24/2018 1853   BILIRUBINUR NEGATIVE 06/24/2018 1853   KETONESUR NEGATIVE 06/24/2018 1853   PROTEINUR NEGATIVE 06/24/2018 1853   UROBILINOGEN 4.0 (H) 02/21/2008 1427   NITRITE NEGATIVE 06/24/2018 1853   LEUKOCYTESUR TRACE (A) 06/24/2018 1853   Sepsis Labs: @LABRCNTIP (procalcitonin:4,lacticidven:4)  ) Recent Results (from the past 240 hour(s))  SARS CORONAVIRUS 2 (TAT 6-24 HRS) Nasopharyngeal Nasopharyngeal Swab     Status: Abnormal   Collection Time: 04/07/19  6:30 PM   Specimen: Nasopharyngeal Swab  Result Value Ref Range Status   SARS Coronavirus 2  POSITIVE (A) NEGATIVE Final    Comment: RESULT CALLED TO, READ BACK BY AND VERIFIED WITH: Billie Lade [id number] 04/08/2019 T. TYSOR (NOTE) SARS-CoV-2 target nucleic acids are DETECTED. The SARS-CoV-2 RNA is generally detectable in upper and lower respiratory specimens during the acute phase of infection. Positive results are indicative of active infection with SARS-CoV-2. Clinical  correlation with patient history and other diagnostic information is necessary to determine patient infection status. Positive results do  not rule out bacterial infection or co-infection with other viruses. The expected result is Negative. Fact Sheet for Patients: SugarRoll.be Fact Sheet for Healthcare Providers: https://www.-mathews.com/ This test is not yet approved or cleared by the [place] FDA and  has been authorized for detection and/or diagnosis of SARS-CoV-2 by FDA under an Emergency Use Authorization (EUA). This EUA will remain  in effect (meaning this test can be used)  for the duration of the COVID-19 declaration under Section 564(b)(1) of the Act, 21 U.S.C. section 360bbb-3(b)(1), unless the authorization is terminated or revoked sooner. Performed at [facility], [place] [address] [place], [place] [address]          Radiology Studies: Ct Angio Chest Pe W And/or Wo Contrast  Result Date: 04/07/2019 CLINICAL DATA:  Shortness of breath for 3 days, dyspnea with exertion, RIGHT-side nonradiating chest pain rated 8/10, throbbing, normal sinus rhythm, former smoker EXAM: CT ANGIOGRAPHY CHEST WITH CONTRAST TECHNIQUE: Multidetector CT imaging of the chest was performed using the standard protocol during bolus administration of intravenous contrast. Multiplanar CT image reconstructions and MIPs were obtained to evaluate the vascular anatomy. CONTRAST:  148mL OMNIPAQUE IOHEXOL 350 MG/ML SOLN IV COMPARISON:  None FINDINGS: Cardiovascular:  Atherosclerotic calcifications aorta, coronary arteries, and proximal great vessels. Ascending aorta upper normal caliber 3.8 cm diameter. Cardiac chambers minimally prominent. Pulmonary arteries well opacified within large mint of central pulmonary arteries question pulmonary arterial hypertension. Pulmonary arteries well opacified and patent. No evidence of pulmonary embolism. Mediastinum/Nodes: Esophagus normal appearance. Base of cervical region normal appearance. Axilla unremarkable. Normal size mediastinal lymph nodes including para-aortic, AP window, RIGHT paratracheal and BILATERAL hilar. No definite thoracic adenopathy. Lungs/Pleura: Patchy airspace infiltrates RIGHT upper and RIGHT lower lobes consistent with pneumonia. Minimal peripheral infiltrate in RIGHT middle and LEFT  upper lobes. Atelectasis and questionable mild infiltrate LEFT lower lobe. No pleural effusion or pneumothorax. Minimal peribronchial thickening. Upper Abdomen: Unremarkable Musculoskeletal: No acute osseous findings. Review of the MIP images confirms the above findings. IMPRESSION: No evidence of pulmonary embolism. Enlarged central pulmonary arteries question pulmonary arterial hypertension. BILATERAL pulmonary infiltrates consistent with pneumonia. Atelectasis and questionable infiltrate LEFT lower lobe. Aortic Atherosclerosis (ICD10-I70.0). Electronically Signed   By: Lavonia Dana M.D.   On: 04/07/2019 19:33   Dg Chest Port 1 View  Result Date: 04/08/2019 CLINICAL DATA:  Viral pneumonia. EXAM: PORTABLE CHEST 1 VIEW COMPARISON:  06/24/2018 FINDINGS: Enlarged cardiac silhouette with an interval increase in size. Stable tortuous and calcified thoracic aorta. Stable elevation of the left hemidiaphragm. Minimal left basilar atelectasis or scarring. Mildly prominent interstitial markings without significant change. Bilateral shoulder degenerative changes and lower thoracic spine degenerative changes. IMPRESSION: 1. No acute  abnormality. 2. Stable cardiomegaly. 3. Stable mild chronic interstitial lung disease. 4. Aortic atherosclerosis. Electronically Signed   By: Claudie Revering M.D.   On: 04/08/2019 08:36        Scheduled Meds: . carbidopa-levodopa  1 tablet Oral QHS  . carbidopa-levodopa  1 tablet Oral q1800  . carbidopa-levodopa  2 tablet Oral 3 times per day  . enoxaparin (LOVENOX) injection  40 mg Subcutaneous Daily  . entacapone  200 mg Oral 3 times per day  . Ipratropium-Albuterol  1 puff Inhalation Q6H  . Pimavanserin Tartrate  1 capsule Oral Daily  . sodium chloride flush  3 mL Intravenous Q12H  . tamsulosin  0.4 mg Oral Daily  . vitamin C  500 mg Oral Daily  . zinc sulfate  220 mg Oral Daily   Continuous Infusions: . sodium chloride       LOS: 2 days   The patient is critically ill with multiple organ systems failure and requires high complexity decision making for assessment and support, frequent evaluation and titration of therapies, application of advanced monitoring technologies and extensive interpretation of multiple databases. Critical Care Time devoted to patient care services described in this note  Time spent: 40 minutes     Deneen Slager, Geraldo Docker, MD [facility] Pager [phone]  If 7PM-7AM, please contact night-coverage [email or web address] Password [facility] 04/09/2019, 2:39 PM

## 2019-04-09 NOTE — Evaluation (Addendum)
Physical Therapy Evaluation Patient Details Name: Theodore King MRN: [id number] DOB: 05-13-1946 Today's Date: 04/09/2019   History of Present Illness  Pt is a 73 y.o. male admitted 04/07/19 with DOE, myalgias and productive cough. (+) COVID-19. PMH includes Parkinson's disease, neuropathy, chronic respiratory failure, benign paroxysmal vertigo.    Clinical Impression  Pt presents with an overall decrease in functional mobility secondary to above. PTA, pt mod indep with SPC/RW, lives with daughter who works during day. Today, pt moving well at supervision-level using RW; SpO2 >92% on RA with mobility. Pt would benefit from continued acute PT services to maximize functional mobility and independence prior to d/c with HHPT services.     Follow Up Recommendations Home health PT;Supervision for mobility/OOB    Equipment Recommendations  None recommended by PT    Recommendations for Other Services       Precautions / Restrictions Precautions Precautions: Fall Restrictions Weight Bearing Restrictions: No      Mobility  Bed Mobility               General bed mobility comments: Received sitting EOB  Transfers Overall transfer level: Needs assistance Equipment used: Rolling walker (2 wheeled) Transfers: Sit to/from Stand Sit to Stand: Supervision            Ambulation/Gait Ambulation/Gait assistance: Supervision Gait Distance (Feet): 250 Feet Assistive device: Rolling walker (2 wheeled) Gait Pattern/deviations: Step-through pattern;Decreased stride length;Shuffle;Trunk flexed Gait velocity: Decreased Gait velocity interpretation: 1.31 - 2.62 ft/sec, indicative of limited community ambulator General Gait Details: Slow, steady gait with RW, pt shuffling with turns/direction changes; supervision for safety. SpO2 >92% on RA  Stairs            Wheelchair Mobility    Modified Rankin (Stroke Patients Only)       Balance Overall balance assessment: Needs  assistance   Sitting balance-Leahy Scale: Good       Standing balance-Leahy Scale: Fair Standing balance comment: Can static stand at sink to perform posterior pericare and washup without UE support; dynamic stability improved with UE support                             Pertinent Vitals/Pain Pain Assessment: No/denies pain    Home Living Family/patient expects to be discharged to:: Private residence Living Arrangements: Children Available Help at Discharge: Family;Available PRN/intermittently Type of Home: Apartment Home Access: Stairs to enter Entrance Stairs-Rails: Right Entrance Stairs-Number of Steps: 5 Home Layout: One level Home Equipment: Walker - 2 wheels;Cane - single point;Grab bars - tub/shower      Prior Function Level of Independence: Needs assistance   Gait / Transfers Assistance Needed: Mod indep with RW indoors, SPC outdoors; reports he only needs help from daughter to get out of bed in the morning. Drives sometimes. Enjoys bowling, fishing  ADL's / Homemaking Assistance Needed: Stands for showers. Does some cooking, but reports daughter will leave him food prepped if she leaves for work        Journalist, newspaper        Extremity/Trunk Assessment   Upper Extremity Assessment Upper Extremity Assessment: Overall WFL for tasks assessed    Lower Extremity Assessment Lower Extremity Assessment: Overall WFL for tasks assessed       Communication   Communication: Expressive difficulties(slower to get words out)  Cognition Arousal/Alertness: Awake/alert Behavior During Therapy: WFL for tasks assessed/performed Overall Cognitive Status: No family/caregiver present to determine baseline cognitive functioning Area of  Impairment: Attention;Following commands;Problem solving                   Current Attention Level: Sustained;Selective   Following Commands: Follows one step commands with increased time     Problem Solving: Slow  processing;Decreased initiation;Requires verbal cues        General Comments      Exercises     Assessment/Plan    PT Assessment Patient needs continued PT services  PT Problem List Decreased activity tolerance;Decreased balance;Decreased mobility       PT Treatment Interventions DME instruction;Gait training;Stair training;Functional mobility training;Therapeutic activities;Therapeutic exercise;Balance training;Patient/family education    PT Goals (Current goals can be found in the Care Plan section)  Acute Rehab PT Goals Patient Stated Goal: Return home PT Goal Formulation: With patient Time For Goal Achievement: 04/23/19 Potential to Achieve Goals: Good    Frequency Min 3X/week   Barriers to discharge        Co-evaluation               AM-PAC PT "6 Clicks" Mobility  Outcome Measure Help needed turning from your back to your side while in a flat bed without using bedrails?: A Little Help needed moving from lying on your back to sitting on the side of a flat bed without using bedrails?: A Little Help needed moving to and from a bed to a chair (including a wheelchair)?: A Little Help needed standing up from a chair using your arms (e.g., wheelchair or bedside chair)?: None Help needed to walk in hospital room?: A Little Help needed climbing 3-5 steps with a railing? : A Little 6 Click Score: 19    End of Session   Activity Tolerance: Patient tolerated treatment well Patient left: in chair;with call bell/phone within reach;with chair alarm set Nurse Communication: Mobility status PT Visit Diagnosis: Other abnormalities of gait and mobility (R26.89)    Time: [id number] PT Time Calculation (min) (ACUTE ONLY): 42 min   Charges:   PT Evaluation $PT Eval Moderate Complexity: 1 Mod PT Treatments $Gait Training: 8-22 mins $Therapeutic Activity: 8-22 mins      Mabeline Caras, PT, DPT Acute Rehabilitation Services  Pager [phone] Office  [place] 04/09/2019, 10:53 AM

## 2019-04-10 DIAGNOSIS — J189 Pneumonia, unspecified organism: Secondary | ICD-10-CM

## 2019-04-10 LAB — COMPREHENSIVE METABOLIC PANEL
ALT: 5 U/L (ref 0–44)
AST: 19 U/L (ref 15–41)
Albumin: 3.1 g/dL — ABNORMAL LOW (ref 3.5–5.0)
Alkaline Phosphatase: 55 U/L (ref 38–126)
Anion gap: 9 (ref 5–15)
BUN: 14 mg/dL (ref 8–23)
CO2: 27 mmol/L (ref 22–32)
Calcium: 8.6 mg/dL — ABNORMAL LOW (ref 8.9–10.3)
Chloride: 99 mmol/L (ref 98–111)
Creatinine, Ser: 0.96 mg/dL (ref 0.61–1.24)
GFR calc Af Amer: >60 mL/min (ref >60)
GFR calc non Af Amer: >60 mL/min (ref >60)
Glucose, Bld: 91 mg/dL (ref 70–99)
Potassium: 3.5 mmol/L (ref 3.5–5.1)
Sodium: 135 mmol/L (ref 135–145)
Total Bilirubin: 0.7 mg/dL (ref 0.3–1.2)
Total Protein: 6.6 g/dL (ref 6.5–8.1)

## 2019-04-10 LAB — CBC WITH DIFFERENTIAL/PLATELET
Abs Immature Granulocytes: 0.01 10*3/uL (ref 0.00–0.07)
Basophils Absolute: 0 10*3/uL (ref 0.0–0.1)
Basophils Relative: 0 %
Eosinophils Absolute: 0 10*3/uL (ref 0.0–0.5)
Eosinophils Relative: 0 %
HCT: 39.5 % (ref 39.0–52.0)
Hemoglobin: 12.8 g/dL — ABNORMAL LOW (ref 13.0–17.0)
Immature Granulocytes: 0 %
Lymphocytes Relative: 23 %
Lymphs Abs: 0.8 10*3/uL (ref 0.7–4.0)
MCH: 27.2 pg (ref 26.0–34.0)
MCHC: 32.4 g/dL (ref 30.0–36.0)
MCV: 83.9 fL (ref 80.0–100.0)
Monocytes Absolute: 0.4 10*3/uL (ref 0.1–1.0)
Monocytes Relative: 10 %
Neutro Abs: 2.4 10*3/uL (ref 1.7–7.7)
Neutrophils Relative %: 67 %
Platelets: 154 10*3/uL (ref 150–400)
RBC: 4.71 MIL/uL (ref 4.22–5.81)
RDW: 12.2 % (ref 11.5–15.5)
WBC: 3.6 10*3/uL — ABNORMAL LOW (ref 4.0–10.5)
nRBC: 0 % (ref 0.0–0.2)

## 2019-04-10 LAB — C-REACTIVE PROTEIN: CRP: 5.6 mg/dL — ABNORMAL HIGH (ref <1.0)

## 2019-04-10 LAB — FERRITIN: Ferritin: 215 ng/mL (ref 24–336)

## 2019-04-10 LAB — MAGNESIUM: Magnesium: 1.6 mg/dL — ABNORMAL LOW (ref 1.7–2.4)

## 2019-04-10 LAB — PROCALCITONIN: Procalcitonin: <0.1 ng/mL

## 2019-04-10 LAB — D-DIMER, QUANTITATIVE: D-Dimer, Quant: 0.96 ug/mL-FEU — ABNORMAL HIGH (ref 0.00–0.50)

## 2019-04-10 LAB — PHOSPHORUS: Phosphorus: 2.6 mg/dL (ref 2.5–4.6)

## 2019-04-10 MED ORDER — ASCORBIC ACID 500 MG PO TABS: 500.0000 mg | ORAL_TABLET | Freq: Every day | ORAL | 0 refills | Status: DC

## 2019-04-10 MED ORDER — ZINC SULFATE 220 (50 ZN) MG PO CAPS: 220.0000 mg | ORAL_CAPSULE | Freq: Every day | ORAL | 0 refills | Status: DC

## 2019-04-10 NOTE — Progress Notes (Signed)
Discharge instructions reviewed with the patient and his daughter [name]. IV was removed. Patient left the floor for discharge at this time in stable condition.

## 2019-04-10 NOTE — Care Management Important Message (Signed)
Important Message  Patient Details  Name: Theodore King MRN: [id number] Date of Birth: [birth year]   Medicare Important Message Given:  Yes - Important Message mailed due to current National Emergency  Verbal consent obtained due to current National Emergency  Relationship to patient: Child Contact Name: [name] Call Date: 04/10/19  Time: [id number] Phone: [phone] Outcome: Spoke with contact Important Message mailed to: Other (must enter comment)(emailed to [email or web address])    Amour Trigg P Mahiya Kercheval 04/10/2019, 1:49 PM

## 2019-04-10 NOTE — Plan of Care (Signed)

## 2019-04-10 NOTE — Progress Notes (Signed)
SATURATION QUALIFICATIONS: (This note is used to comply with regulatory documentation for home oxygen)  Patient Saturations on Room Air at Rest = 97%  Patient Saturations on Room Air while Ambulating = 95%   Please briefly explain why patient needs home oxygen: No home oxygen needed.

## 2019-04-10 NOTE — Discharge Summary (Signed)
Physician Discharge Summary  Theodore King [id number] DOB: 03/06/1946 DOA: 04/07/2019  PCP: [name], MD  Admit date: 04/07/2019 Discharge date: 04/10/2019  Time spent: 30 minutes  Recommendations for Outpatient   Follow-up: Covid pneumonia -Currently on room air SPO2 98%.  Unsure why patient was excepted for admission.  Awaiting PT/OT evaluation, patient may desat with exertion. -No Covid therapy indicated at this time -Ambulatory SPO2 pending -Patient aware that he will be most likely discharged in the a.m. barring finding any significant reason to keep him here in the work-up SATURATION QUALIFICATIONS: (This note is used to comply with regulatory documentation for home oxygen) Patient Saturations on Room Air at Rest = 97% Patient Saturations on Room Air while Ambulating = 95% Please briefly explain why patient needs home oxygen: No home oxygen needed. -Patient does not qualify for home O2 -Face-to-face completed on 10/2 for home health  COVID-19 Labs  Recent Labs (last 2 labs)       Recent Labs    04/08/19 0835 04/09/19 0539  DDIMER 1.40* 0.94*  FERRITIN 219 208  LDH 199*  --   CRP 4.7* 4.6*      Recent Labs       Lab Results  Component Value Date   SARSCOV2NAA POSITIVE (A) 04/07/2019      Mixed restrictive and obstructive lung disease/ILD -Per patient does not use home O2 or CPAP. -Uses inhaler PRN -Upon discharge patient will need to follow-up with Dr. Loanne Drilling (pulmonology).  His last office visit was 08/15/2018 at which time he was instructed to follow-up in 2 months does not appear he kept that appointment -Schedule follow-up with Dr. Margaretha Seeds pulmonology in 2 weeks evaluation of mixed restrictive/obstructive lung disease/ILD  REM sleep behavior disorder  Parkinson's disease -All Parkinson's medication per his MAR prior to admission.  BPH -Flomax 0.4 mg daily   Discharge Diagnoses:  Active Problems:   Parkinson's disease  ([place])   REM sleep behavior disorder   Mixed restrictive and obstructive lung disease (HCC)   Viral pneumonia, unspecified   Viral pneumonia   Pneumonia due to COVID-19 virus   Discharge Condition: Stable  Diet recommendation: Carb modified  Filed Weights   04/07/19 1748 04/08/19 0244  Weight: 93 kg 89.8 kg    History of present illness:  73 y.o.BM PMHx Parkinson's disease, other inflammatory and toxic neuropathy, chronic respiratory failure with hypoxia, prediabetes (hemoglobin A1c= 5.7), HLD, benign paroxysmal vertigo, REM sleep behavior disorder,He is followed by Dr. Carles Collet for neurology and Dr. Loanne Drilling for pulmonary.   Patient was in his usual state of health until 24 to 36 hours prior to admission when he became increasingly short of breath with exertion. He admits to having rigors but no fever. He admits to having myalgias.He admits to a dry cough which occasionally is productive of a whitish sputum. He presents to [facility] ED because of his shortness of breath.   ED Course:Dynamically stable. Operatory revealed the patient to be leukopenic and thrombocytopenic. CT a of the chest revealed the patient to have patchy infiltrates in the right upper lobe, right lower lobe, right middle lobe. He had increased infiltrate and atelectasis at the left base. COVID-19 testing is pending [facility] was called to admit the patient because of his shortness of breath.  Hospital Course:  During his hospitalization patient received no treatment for Covid related symptoms other than fatigue.  Stable for discharge  Procedures: 11/1 PCXR; negative acute abnormality -Stable mild chronic interstitial lung disease  Discharge Exam: Vitals:   04/09/19 1905 04/09/19 2025 04/10/19 0335 04/10/19 0741  BP:  (!) 143/72 131/70 (!) 143/66  Pulse: 80 78 74 70  Resp:  20 20 20   Temp:  98.2 F (36.8 C) 97.8 F (36.6 C) 98.7 F (37.1 C)  TempSrc:  Oral Oral Oral  SpO2: 96% 99% 98% 98%   Weight:      Height:        General: A/O x4 no acute respiratory distress Eyes: negative scleral hemorrhage, negative anisocoria, negative icterus ENT: Negative Runny nose, negative gingival bleeding, Neck:  Negative scars, masses, torticollis, lymphadenopathy, JVD Lungs: Clear to auscultation bilaterally without wheezes or crackles Cardiovascular: Regular rate and rhythm without murmur gallop or rub normal S1 and S2   Discharge Instructions   Allergies as of 04/10/2019   No Known Allergies     Medication List    TAKE these medications   ascorbic acid 500 MG tablet Commonly known as: VITAMIN C Take 1 tablet (500 mg total) by mouth daily. Start taking on: April 11, 2019   carbidopa-levodopa 50-200 MG tablet Commonly known as: SINEMET CR Take 1 tablet by mouth at bedtime.   carbidopa-levodopa 25-100 MG tablet Commonly known as: SINEMET IR TAKE 2 TABLETS BY MOUTH AT 7 AM , 2 TABLETS AT 10 AM , 2 TABLETS AT 2 PM, 1 TABLET AT 6 PM   entacapone 200 MG tablet Commonly known as: COMTAN TAKE 1 TABLET BY MOUTH THREE TIMES DAILY TAKE  1  AT  7AM,  10AM,  2PM. What changed: See the new instructions.   etodolac 400 MG tablet Commonly known as: LODINE Take 400 mg by mouth 2 (two) times daily as needed for pain.   ibuprofen 400 MG tablet Commonly known as: ADVIL Take 400 mg by mouth every 6 (six) hours as needed.   meclizine 25 MG tablet Commonly known as: ANTIVERT Take 25 mg by mouth 2 (two) times daily as needed for dizziness.   metFORMIN 500 MG tablet Commonly known as: GLUCOPHAGE Take 500 mg by mouth daily with breakfast.   Nuplazid 34 MG Caps Generic drug: Pimavanserin Tartrate TAKE 1 CAPSULE BY MOUTH ONCE DAILY What changed: how much to take   tamsulosin 0.4 MG Caps capsule Commonly known as: FLOMAX Take 0.4 mg by mouth daily.   Ventolin HFA 108 (90 Base) MCG/ACT inhaler Generic drug: albuterol Inhale 2 puffs into the lungs every 4 (four) hours as needed  for wheezing or shortness of breath.   zinc sulfate 220 (50 Zn) MG capsule Take 1 capsule (220 mg total) by mouth daily. Start taking on: April 11, 2019      No Known Allergies Follow-up Information    [facility] Follow up.   Specialty: Home Health Services Why: for home health services, they will call you in 1-2 days to set up your first appointment.  Contact information: [place] [address] [phone]        Margaretha Seeds, MD On 04/24/2019.   Specialty: Pulmonary Disease Why: @9 :00AM   Dr.Ellison 2 weeks evaluation of mixed restrictive/obstructive lung disease/ILD Contact information: [place] [address] [phone]            The results of significant diagnostics from this hospitalization (including imaging, microbiology, ancillary and laboratory) are listed below for reference.    Significant Diagnostic Studies: Ct Angio Chest Pe W And/or Wo Contrast  Result Date: 04/07/2019 CLINICAL DATA:  Shortness of breath for 3 days, dyspnea with exertion, RIGHT-side nonradiating chest pain rated 8/10, throbbing, normal sinus rhythm, former smoker EXAM: CT ANGIOGRAPHY CHEST WITH CONTRAST TECHNIQUE: Multidetector CT imaging of the chest was performed using the standard protocol during bolus administration of intravenous contrast. Multiplanar CT image reconstructions and MIPs were obtained to evaluate the vascular anatomy. CONTRAST:  146mL OMNIPAQUE IOHEXOL 350 MG/ML SOLN IV COMPARISON:  None FINDINGS: Cardiovascular: Atherosclerotic calcifications aorta, coronary arteries, and proximal great vessels. Ascending aorta upper normal caliber 3.8 cm diameter. Cardiac chambers minimally prominent. Pulmonary arteries well opacified within large mint of central pulmonary arteries question pulmonary arterial hypertension. Pulmonary arteries well opacified and patent. No evidence of pulmonary embolism.  Mediastinum/Nodes: Esophagus normal appearance. Base of cervical region normal appearance. Axilla unremarkable. Normal size mediastinal lymph nodes including para-aortic, AP window, RIGHT paratracheal and BILATERAL hilar. No definite thoracic adenopathy. Lungs/Pleura: Patchy airspace infiltrates RIGHT upper and RIGHT lower lobes consistent with pneumonia. Minimal peripheral infiltrate in RIGHT middle and LEFT upper lobes. Atelectasis and questionable mild infiltrate LEFT lower lobe. No pleural effusion or pneumothorax. Minimal peribronchial thickening. Upper Abdomen: Unremarkable Musculoskeletal: No acute osseous findings. Review of the MIP images confirms the above findings. IMPRESSION: No evidence of pulmonary embolism. Enlarged central pulmonary arteries question pulmonary arterial hypertension. BILATERAL pulmonary infiltrates consistent with pneumonia. Atelectasis and questionable infiltrate LEFT lower lobe. Aortic Atherosclerosis (ICD10-I70.0). Electronically Signed   By: Lavonia Dana M.D.   On: 04/07/2019 19:33   Dg Chest Port 1 View  Result Date: 04/08/2019 CLINICAL DATA:  Viral pneumonia. EXAM: PORTABLE CHEST 1 VIEW COMPARISON:  06/24/2018 FINDINGS: Enlarged cardiac silhouette with an interval increase in size. Stable tortuous and calcified thoracic aorta. Stable elevation of the left hemidiaphragm. Minimal left basilar atelectasis or scarring. Mildly prominent interstitial markings without significant change. Bilateral shoulder degenerative changes and lower thoracic spine degenerative changes. IMPRESSION: 1. No acute abnormality. 2. Stable cardiomegaly. 3. Stable mild chronic interstitial lung disease. 4. Aortic atherosclerosis. Electronically Signed   By: Claudie Revering M.D.   On: 04/08/2019 08:36    Microbiology: Recent Results (from the past 240 hour(s))  SARS CORONAVIRUS 2 (TAT 6-24 HRS) Nasopharyngeal Nasopharyngeal Swab     Status: Abnormal   Collection Time: 04/07/19  6:30 PM   Specimen:  Nasopharyngeal Swab  Result Value Ref Range Status   SARS Coronavirus 2 POSITIVE (A) NEGATIVE Final    Comment: RESULT CALLED TO, READ BACK BY AND VERIFIED WITH: Billie Lade [id number] 04/08/2019 T. TYSOR (NOTE) SARS-CoV-2 target nucleic acids are DETECTED. The SARS-CoV-2 RNA is generally detectable in upper and lower respiratory specimens during the acute phase of infection. Positive results are indicative of active infection with SARS-CoV-2. Clinical  correlation with patient history and other diagnostic information is necessary to determine patient infection status. Positive results do  not rule out bacterial infection or co-infection with other viruses. The expected result is Negative. Fact Sheet for Patients: SugarRoll.be Fact Sheet for Healthcare Providers: https://www.-mathews.com/ This test is not yet approved or cleared by the [place] FDA and  has been authorized for detection and/or diagnosis of SARS-CoV-2 by FDA under an Emergency Use Authorization (EUA). This EUA will remain  in effect (meaning this test can be used)  for the duration of the COVID-19 declaration under Section 564(b)(1) of the Act, 21 U.S.C. section 360bbb-3(b)(1), unless the authorization is terminated or revoked sooner. Performed at [facility], [place] [address], [place], [place] [address]   Culture, blood (Routine X 2) w  Reflex to ID Panel     Status: None (Preliminary result)   Collection Time: 04/08/19  8:35 AM   Specimen: BLOOD  Result Value Ref Range Status   Specimen Description   Final    BLOOD RIGHT ANTECUBITAL Performed at [facility] [address] [place], [place] [address]    Special Requests   Final    BOTTLES DRAWN AEROBIC ONLY Blood Culture adequate volume Performed at [facility] [address] [place], [place] [address]    Culture   Final    NO GROWTH < 24 HOURS Performed at [facility] [address], [place], [place] [address]    Report Status PENDING  Incomplete  Culture, blood (Routine X 2) w Reflex to ID Panel     Status: None (Preliminary result)   Collection Time: 04/08/19  8:40 AM   Specimen: BLOOD  Result Value Ref Range Status   Specimen Description   Final    BLOOD LEFT HAND Performed at [facility] [address], [place], [place] [address]    Special Requests   Final    BOTTLES DRAWN AEROBIC ONLY Blood Culture adequate volume Performed at [facility] [address] [place], [place] [address]    Culture   Final    NO GROWTH < 24 HOURS Performed at [facility] [address], [place], [place] [address]    Report Status PENDING  Incomplete     Labs: Basic Metabolic Panel: Recent Labs  Lab 04/07/19 1740 04/07/19 1836 04/08/19 0835 04/09/19 0539 04/10/19 0405  NA 138 139 138 136 135  K 4.2 4.2 4.0 3.6 3.5  CL 102 101 102 101 99  CO2 25  --  27 24 27   GLUCOSE 99 97 105* 81 91  BUN 20 19 15 17 14   CREATININE 1.15 1.10 0.96 0.93 0.96  CALCIUM 8.9  --  8.7* 8.7* 8.6*  MG  --   --  1.8 1.7 1.6*  PHOS  --   --  2.8 2.7 2.6   Liver Function Tests: Recent Labs  Lab 04/07/19 1836 04/08/19 0835 04/09/19 0539 04/10/19 0405  AST 26 23 20 19   ALT 16 5 8 5   ALKPHOS 56 55 53 55  BILITOT 0.9 0.7 1.1 0.7  PROT 6.7 7.1 6.6 6.6  ALBUMIN 3.3* 3.4* 3.3* 3.1*   No results for input(s): LIPASE, AMYLASE in the last 168 hours. No results for input(s): AMMONIA in the last 168 hours. CBC: Recent Labs  Lab 04/07/19 1740 04/07/19 1836 04/08/19 0835 04/09/19 0539 04/10/19 0405  WBC 3.8*  --  3.6* 3.0* 3.6*  NEUTROABS  --   --  2.7 1.8 2.4  HGB 14.3 13.3 13.8 13.2 12.8*  HCT 42.8 39.0 42.1 40.1 39.5  MCV 83.9  --  84.4 83.4 83.9  PLT 120*  --  135* 129* 154   Cardiac Enzymes: No results for input(s): CKTOTAL, CKMB, CKMBINDEX, TROPONINI in the last 168 hours. BNP: BNP (last 3  results) Recent Labs    06/24/18 1836 04/07/19 1838 04/08/19 0835  BNP 23.0 19.8 29.1    ProBNP (last 3 results) No results for input(s): PROBNP in the last 8760 hours.  CBG: No results for input(s): GLUCAP in the last 168 hours.     Signed:  Dia Crawford, MD [facility] [phone] pager

## 2019-04-14 LAB — CULTURE, BLOOD (ROUTINE X 2)
Culture: NO GROWTH
Culture: NO GROWTH
Special Requests: ADEQUATE
Special Requests: ADEQUATE

## 2019-04-24 ENCOUNTER — Ambulatory Visit (INDEPENDENT_AMBULATORY_CARE_PROVIDER_SITE_OTHER): Payer: Medicare HMO | Admitting: Pulmonary Disease

## 2019-04-24 ENCOUNTER — Encounter: Payer: Self-pay | Admitting: Pulmonary Disease

## 2019-04-24 ENCOUNTER — Other Ambulatory Visit: Payer: Self-pay

## 2019-04-24 ENCOUNTER — Telehealth: Payer: Self-pay | Admitting: Neurology

## 2019-04-24 DIAGNOSIS — J1289 Other viral pneumonia: Secondary | ICD-10-CM | POA: Diagnosis not present

## 2019-04-24 DIAGNOSIS — Z8616 Personal history of COVID-19: Secondary | ICD-10-CM | POA: Insufficient documentation

## 2019-04-24 DIAGNOSIS — J439 Emphysema, unspecified: Secondary | ICD-10-CM

## 2019-04-24 DIAGNOSIS — Z8619 Personal history of other infectious and parasitic diseases: Secondary | ICD-10-CM

## 2019-04-24 DIAGNOSIS — J984 Other disorders of lung: Secondary | ICD-10-CM | POA: Diagnosis not present

## 2019-04-24 DIAGNOSIS — U071 COVID-19: Secondary | ICD-10-CM

## 2019-04-24 MED ORDER — STIOLTO RESPIMAT 2.5-2.5 MCG/ACT IN AERS: 2.0000 | INHALATION_SPRAY | Freq: Every day | RESPIRATORY_TRACT | 6 refills | Status: DC

## 2019-04-24 MED ORDER — STIOLTO RESPIMAT 2.5-2.5 MCG/ACT IN AERS: 2.0000 | INHALATION_SPRAY | Freq: Every day | RESPIRATORY_TRACT | 0 refills | Status: DC

## 2019-04-24 NOTE — Telephone Encounter (Signed)
Unsure about this being as goody powder is OTC

## 2019-04-24 NOTE — Patient Instructions (Signed)
Mixed obstructive and restrictive lung defect Recent COVID-19 pneumonia --RESTART Stioloto TWO puffs ONCE a day --CONTINUE Albuterol as needed for shortness of breath or wheezing --CONTINUE being active at home to increase your heart and lung strength  We will provide you a sample inhaler and instructions on inhaler use at the office Please also remember to pick up your inhalers at the pharmacy    Tiotropium; Olodaterol respiratory inhalation spray What is this medicine? TIOTROPIUM; OLODATEROL (tee oh TRO pee um; OH loe DA ter ol) inhalation is a combination of two medicines that help to open up the airways of your lungs. It is for chronic obstructive pulmonary disease (COPD), including chronic bronchitis or emphysema. Do NOT use for asthma or an acute asthma attack. Do NOT use for a COPD attack. This medicine may be used for other purposes; ask your health care provider or pharmacist if you have questions. COMMON BRAND NAME(S): STIOLTO RESPIMAT What should I tell my health care provider before I take this medicine? They need to know if you have any of these conditions:  asthma  bladder problems or difficulty passing urine  diabetes  glaucoma  heart disease or irregular heartbeat  high blood pressure  kidney disease  pheochromocytoma  prostate disease  seizures  thyroid disease  an unusual or allergic reaction to tiotropium, olodaterol, ipratropium, atropine, other medicines, foods, dyes, or preservatives  pregnant or trying to get pregnant  breast-feeding How should I use this medicine? This medicine is inhaled through the mouth. It is used once per day. Follow the directions on the prescription label. Take your medicine at regular intervals. Do not take your medicine more often than directed. Do not stop taking except on your doctor's advice. Make sure that you are using your inhaler correctly. Ask you doctor or health care provider if you have any questions. Talk to  your pediatrician regarding the use of this medicine in children. Special care may be needed. Overdosage: If you think you have taken too much of this medicine contact a poison control center or emergency room at once. NOTE: This medicine is only for you. Do not share this medicine with others. What if I miss a dose? If you miss a dose, use it as soon as you can. If it is almost time for your next dose, use only that dose and continue with your regular schedule. Do not use double or extra doses. What may interact with this medicine? Do not take this medicine with any of the following medications:  MAOIs like Carbex, Eldepryl, Marplan, Nardil, and Parnate This medicine may also interact with the following medications:  atropine  certain medicines for allergy, cough and cold  certain medicines for bladder problems like oxybutynin, tolterodine  certain medicines for stomach problems like dicyclomine, hyoscyamine  certain medicines for travel sickness like scopolamine  certain medicines for Parkinson's disease like benztropine, trihexyphenidyl  diuretics  ipratropium  medicines for depression, anxiety, or psychotic disturbances  medicines for fungal infections like ketoconazole  medicines for weight loss including some herbal products  other medicines that prolong the QT interval (cause an abnormal heart rhythm)  procarbazine  ritonavir  some antibiotics like clarithromycin, erythromycin, levofloxacin, linezolid, and telithromycin  some heart medicines  steroid medicines like prednisone or cortisone  stimulant medicines for attention disorders, weight loss, or to stay awake  theophylline  thyroid hormones This list may not describe all possible interactions. Give your health care provider a list of all the medicines, herbs, non-prescription drugs,  or dietary supplements you use. Also tell them if you smoke, drink alcohol, or use illegal drugs. Some items may interact  with your medicine. What should I watch for while using this medicine? Visit your doctor or health care professional for regular checkups. Tell your doctor or health care professional if your symptoms do not get better. If your symptoms get worse or if you need your short-acting inhalers more often, call your doctor right away. Do not use this medicine more than once every 24 hours. Do not get the this medicine in your eyes. It can cause irritation, pain, or blurred vision. You may get dizzy. Do not drive, use machinery, or do anything that needs mental alertness until you know how this medicine affects you. Do not stand or sit up quickly, especially if you are an older patient. This reduces the risk of dizzy or fainting spells. Clean the inhaler as directed in the patient information sheet that comes with this medicine. What side effects may I notice from receiving this medicine? Side effects that you should report to your doctor or health care professional as soon as possible:  allergic reactions like skin rash or hives, swelling of the face, lips, or tongue  breathing problems right after inhaling your medicine  changes in vision  chest pain  difficulty breathing or wheezing that increases or does not go away  fast, irregular heartbeat  feeling faint or lightheaded, falls  general ill feeling or flu-like symptoms  stomach pain  trouble passing urine or change in the amount of urine  unusually weak or tired Side effects that usually do not require medical attention (report to your doctor or health care professional if they continue or are bothersome):  back pain  constipation  cough  dry mouth  nervousness  runny nose  sore throat  tremor This list may not describe all possible side effects. Call your doctor for medical advice about side effects. You may report side effects to FDA at 1-800-FDA-1088. Where should I keep my medicine? Keep out of the reach of  children. Store at room temperature between 15 and 30 degrees C (59 and 86 degrees F). Do not freeze. The inhaler should be discarded after 3 months or when the inhaler becomes locked, whichever comes first. Throw away any unopened packages after the expiration date. NOTE: This sheet is a summary. It may not cover all possible information. If you have questions about this medicine, talk to your doctor, pharmacist, or health care provider.  2020 Elsevier/Gold Standard (2017-11-07 12:36:40)

## 2019-04-24 NOTE — Telephone Encounter (Signed)
Not sure what she is talking about.  I don't prescribe any pain meds to him so will need to ask other physicians.  I did review recent pulmonology notes from today and recent hospital records and nothing about pain meds

## 2019-04-24 NOTE — Progress Notes (Signed)
Virtual Visit via Telephone Note  I connected with Theodore King on 04/24/19 at  9:00 AM EST by telephone and verified that I am speaking with the correct person using two identifiers.  Location: Patient: Home Provider: [facility] office   I discussed the limitations, risks, security and privacy concerns of performing an evaluation and management service by telephone and the availability of in person appointments. I also discussed with the patient that there may be a patient responsible charge related to this service. The patient expressed understanding and agreed to proceed.   I discussed the assessment and treatment plan with the patient. The patient was provided an opportunity to ask questions and all were answered. The patient agreed with the plan and demonstrated an understanding of the instructions.   The patient was advised to call back or seek an in-person evaluation if the symptoms worsen or if the condition fails to improve as anticipated.  I provided 25 minutes of non-face-to-face time during this encounter.   Bertice Risse Rodman Pickle, MD    Subjective:   PATIENT ID: Theodore King GENDER: male DOB: 1945/11/05, MRN: [id number]   HPI  Chief Complaint  Patient presents with  . Hospitalization Follow-up    still having shortness of breath, using IS,    Theodore King is a 73 year old male with idiopathic Parkinson's disease and recent COVID-19 pneumonia who presents for hospital follow-up. Daughter also on phone and provided additional history.  On his last visit, he was started on Stioloto for mixed obstructive and restrictive lung defect. He has not taken this after completing his sample (inhaler was also damaged per daughter). The inhaler did work when he was using it. He has been briefly hospitalized 10/31-11/3 for COVID-19 for severe dyspnea. He did not require supplemental oxygen. No steroids or investigational therapy. given. He has been receiving home  physical therapy since discharge including shuffling within the house and basic ADLs. Previously went to therapy sessions designed for patients with Parkinsons and will resume once his quarantine time is completed. He still reports fatigue and shortness of breath. When he is not doing therapy, he is sedentary in bed or couch. He requires his albuterol 2-3 times a day with some relief.  Social History: Former smoker. Quit 30 years ago. Smoked 8-9 years with pack lasting one week.  I have personally reviewed patient's past medical/family/social history/allergies/current medications.  Past Medical History:  Diagnosis Date  . Abnormal dreams 09/06/2013  . Disorders of bursae and tendons in shoulder region, unspecified   . Hyperglycemia 05/18/2013  . Hyperlipidemia LDL goal < 100 05/18/2013  . Other and unspecified hyperlipidemia   . Other and unspecified hyperlipidemia   . 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 Paralysis agitans ([place]) 05/18/2013  . Parkinson's disease ([place])   . 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 SOB (shortness of breath) 03/23/2018  . Unspecified vitamin D deficiency   . Unspecified vitamin D deficiency   . Vertigo, benign paroxysmal 11/05/2016  . Vitamin D deficiency 05/18/2013     Family History  Problem Relation Age of Onset  . Cancer Mother   . Cancer Father   . Stroke Brother   . Cancer Sister   . Healthy Daughter   . Cancer Sister   . Lung cancer Brother   . Healthy Daughter   . Colon cancer Neg Hx   . Esophageal cancer Neg Hx   . Rectal cancer Neg Hx   . Stomach  cancer Neg Hx      Social History   Occupational History    Employer: RETIRED  Tobacco Use  . Smoking status: Former Smoker    Packs/day: 0.50    Years: 20.00    Pack years: 10.00    Start date: 08/14/1953    Quit date: 05/23/1974    Years since quitting: 44.9  . Smokeless tobacco: Never Used  Substance and Sexual Activity  . Alcohol use: Not Currently  . Drug use: No   . Sexual activity: Never    No Known Allergies   Outpatient Medications Prior to Visit  Medication Sig Dispense Refill  . albuterol (VENTOLIN HFA) 108 (90 Base) MCG/ACT inhaler Inhale 2 puffs into the lungs every 4 (four) hours as needed for wheezing or shortness of breath.     . carbidopa-levodopa (SINEMET CR) 50-200 MG tablet Take 1 tablet by mouth at bedtime. 90 tablet 1  . carbidopa-levodopa (SINEMET IR) 25-100 MG tablet TAKE 2 TABLETS BY MOUTH AT 7 AM , 2 TABLETS AT 10 AM , 2 TABLETS AT 2 PM, 1 TABLET AT 6 PM 112 tablet 0  . entacapone (COMTAN) 200 MG tablet TAKE 1 TABLET BY MOUTH THREE TIMES DAILY TAKE  1  AT  7AM,  10AM,  2PM. (Patient taking differently: Take 200 mg by mouth 3 (three) times daily. TAKE 1 TABLET BY MOUTH THREE TIMES DAILY TAKE  1  AT  7AM,  10AM,  2PM.) 270 tablet 0  . etodolac (LODINE) 400 MG tablet Take 400 mg by mouth 2 (two) times daily as needed for pain.    Marland Kitchen ibuprofen (ADVIL,MOTRIN) 400 MG tablet Take 400 mg by mouth every 6 (six) hours as needed.    . meclizine (ANTIVERT) 25 MG tablet Take 25 mg by mouth 2 (two) times daily as needed for dizziness.     . metFORMIN (GLUCOPHAGE) 500 MG tablet Take 500 mg by mouth daily with breakfast.     . NUPLAZID 34 MG CAPS TAKE 1 CAPSULE BY MOUTH ONCE DAILY (Patient taking differently: Take 34 mg by mouth daily. ) 90 capsule 1  . tamsulosin (FLOMAX) 0.4 MG CAPS capsule Take 0.4 mg by mouth daily.    . vitamin C (VITAMIN C) 500 MG tablet Take 1 tablet (500 mg total) by mouth daily. 30 tablet 0  . zinc sulfate 220 (50 Zn) MG capsule Take 1 capsule (220 mg total) by mouth daily. 30 capsule 0   No facility-administered medications prior to visit.     Review of Systems  Constitutional: Positive for malaise/fatigue. Negative for chills, diaphoresis, fever and weight loss.  HENT: Negative for congestion.   Respiratory: Positive for shortness of breath. Negative for cough, hemoptysis, sputum production and wheezing.    Cardiovascular: Negative for chest pain, palpitations and leg swelling.     Objective:   There were no vitals filed for this visit.    Physical Exam: General: No acute distress on the phone Respiratory: No audible wheezing or respiratory distress noted Neuro: Slow speech Psych: Normal mood, normal affect, appropriate responses  Data Reviewed  Chest imaging: CTA 04/07/19 - Bilateral lower lobe infiltrates, chronic left hemidiaphragm elevation CXR 03/27/18 - Chronic left hemidiaphragm elevation compared to prior imaging in EMR  PFT:  08/15/18 FVC 2.6 (69%) FEV1 1.6 (57%) Ratio 68  TLC 76% DLCO 96% Interpretation: Mixed moderately severe obstructive and restrictive defect. No significant BD effect. DLCO normal. Flow-volume curve suggestive of variable extrathoracic airway obstruction  COVID-19 Labs  No results for input(s): DDIMER, FERRITIN, LDH, CRP in the last 72 hours.  Lab Results  Component Value Date   [place] (A) 04/07/2019   Imaging, labs and test noted above have been reviewed independently by me.    Assessment & Plan:   Discussion: 73 year old male with mixed obstructive and restrictive lung defect, idiopathic Parkinson's disease and recent COVID-19 pneumonia who presents for hospital follow-up. Recent admission for COVID-19 pneumonia however did not require supplemental oxygen. No steroids or investigational therapy given. Reports dyspnea on exertion requiring albuterol. Not on maintenance inhalers however benefited from [place] in the past.  Mixed obstructive and restrictive lung defect Recent COVID-19 pneumonia --RESTART Stioloto TWO puffs ONCE a day --CONTINUE Albuterol as needed for shortness of breath or wheezing --CONTINUE being active at home to increase your heart and lung strength  We will provide you a sample inhaler and instructions on inhaler use at the office Please also remember to pick up your inhalers at the pharmacy     No  orders of the defined types were placed in this encounter.  Meds ordered this encounter  Medications  . Tiotropium Bromide-Olodaterol (STIOLTO RESPIMAT) 2.5-2.5 MCG/ACT AERS    Sig: Inhale 2 puffs into the lungs daily.    Dispense:  4 g    Refill:  0    Order Specific Question:   Lot Number?    Answer:   [id number]    Order Specific Question:   Expiration Date?    Answer:   05/26/2021    Order Specific Question:   Quantity    Answer:   1    Return in about 1 month (around 05/24/2019), or Televisit.  Also schedule 3 month visit for in-person visit  Micky Overturf Rodman Pickle, MD [facility] 04/24/2019 8:17 AM  Personal pager: [phone] If unanswered, please page CCM On-call: [phone]

## 2019-04-24 NOTE — Telephone Encounter (Signed)
Theodore King is aware, and will be seeing the PCP today

## 2019-04-24 NOTE — Telephone Encounter (Signed)
Needs to speak to someone about pain medication they were told Gabriel Earing Powder was not good for him    They use the [facility] on [address]   Last seen on 12-19-18

## 2019-04-25 ENCOUNTER — Other Ambulatory Visit: Payer: Self-pay | Admitting: Neurology

## 2019-04-25 ENCOUNTER — Telehealth: Payer: Self-pay | Admitting: *Deleted

## 2019-04-25 DIAGNOSIS — G20 Parkinson's disease: Secondary | ICD-10-CM

## 2019-04-25 DIAGNOSIS — F060 Psychotic disorder with hallucinations due to known physiological condition: Secondary | ICD-10-CM

## 2019-04-25 NOTE — Telephone Encounter (Signed)
-----   Message from [place], MD sent at 04/24/2019  9:39 AM EST ----- 1 month televisit3 month in-person visit

## 2019-04-25 NOTE — Telephone Encounter (Signed)
ATC patient unable to reach LM to call back office (x1)  

## 2019-04-27 NOTE — Telephone Encounter (Signed)
Called pt to make both appointments. No answer left VM to call office to schedule appointments 11/20// km

## 2019-05-09 NOTE — Telephone Encounter (Signed)
05/09/19 - lmovm for pt to schedule appts w/Dr. Loanne Drilling.  ta

## 2019-05-18 NOTE — Progress Notes (Signed)
Virtual Visit via Video Note The purpose of this virtual visit is to provide medical care while limiting exposure to the novel coronavirus.    Consent was obtained for video visit:  Yes.   Answered questions that patient had about telehealth interaction:  Yes.   I discussed the limitations, risks, security and privacy concerns of performing an evaluation and management service by telemedicine. I also discussed with the patient that there may be a patient responsible charge related to this service. The patient expressed understanding and agreed to proceed.  Pt location: Home Physician Location: office Name of referring provider:  [name], MD I connected with Theodore King at patients initiation/request on 05/22/2019 at 10:45 AM EST by video enabled telemedicine application and verified that I am speaking with the correct person using two identifiers. Pt MRN:  [id number] Pt DOB:  1946-03-18 Video Participants:  Theodore King;  Daughter, [name]   History of Present Illness:  Patient seen today in follow-up.  His daughter accompanies him to the visit and supplements the history.  Reports that he is taking his carbidopa/levodopa 25/100, 2 tablets at 7 AM, 2 tablets at 10 AM, 2 tablets at 2 PM and 1 tablet at 6 PM.  On entacapone 200 mg 3 times per day with the first 3 dosages of levodopa.  Reports he is on carbidopa/levodopa 50/200 at bedtime.  No hallucinations with Nuplazid.  Detailed review of medical records was undertaken since last visit.  He was in the hospital at the end of October and discharged on November 3 due to Covid.  He did not go to a nursing facility after his discharge from the hospital.  He did follow-up with pulmonary afterwards and those records are reviewed.  He is just finishing PT and has done well with this.  No falls.  Daughter asks if Parkinson's is a painful condition.  She states that they have an appointment later today with primary care regarding getting  something for his back pain.  Daughter does note that the patient has better and worse days and she is not quite sure what contributes to the fluctuations.  Prior medications: Levodopa; Azilect; Zanaflex; pramipexole   Current Outpatient Medications on File Prior to Visit  Medication Sig Dispense Refill  . albuterol (VENTOLIN HFA) 108 (90 Base) MCG/ACT inhaler Inhale 2 puffs into the lungs every 4 (four) hours as needed for wheezing or shortness of breath.     . Aspirin-Acetaminophen (GOODYS BODY PAIN PO) Take 1 Package by mouth daily.    . carbidopa-levodopa (SINEMET IR) 25-100 MG tablet TAKE 2 TABLETS BY MOUTH AT 7 AM , 2 TABLETS AT 10 AM , 2 TABLETS AT 2 PM, 1 TABLET AT 6 PM 112 tablet 0  . entacapone (COMTAN) 200 MG tablet TAKE 1 TABLET BY MOUTH THREE TIMES DAILY TAKE  1  AT  7AM,  10AM,  2PM. (Patient taking differently: Take 200 mg by mouth 3 (three) times daily. TAKE 1 TABLET BY MOUTH THREE TIMES DAILY TAKE  1  AT  7AM,  10AM,  2PM.) 270 tablet 0  . etodolac (LODINE) 400 MG tablet Take 400 mg by mouth 2 (two) times daily as needed for pain.    Marland Kitchen ibuprofen (ADVIL,MOTRIN) 400 MG tablet Take 400 mg by mouth every 6 (six) hours as needed.    . meclizine (ANTIVERT) 25 MG tablet Take 25 mg by mouth 2 (two) times daily as needed for dizziness.     . metFORMIN (GLUCOPHAGE)  500 MG tablet Take 500 mg by mouth daily with breakfast.     . NUPLAZID 34 MG CAPS TAKE 1 CAPSULE BY MOUTH ONCE DAILY 30 capsule 5  . Tiotropium Bromide-Olodaterol (STIOLTO RESPIMAT) 2.5-2.5 MCG/ACT AERS Inhale 2 puffs into the lungs daily. 4 g 0  . Tiotropium Bromide-Olodaterol (STIOLTO RESPIMAT) 2.5-2.5 MCG/ACT AERS Inhale 2 puffs into the lungs daily. 4 g 6  . vitamin C (VITAMIN C) 500 MG tablet Take 1 tablet (500 mg total) by mouth daily. 30 tablet 0   No current facility-administered medications on file prior to visit.     Observations/Objective:   There were no vitals filed for this visit. GEN:  The patient appears  stated age and is in NAD.  Neurological examination:  Orientation: The patient is alert and oriented x3. Cranial nerves: There is good facial symmetry. There is mild facial hypomimia.  The speech is fluent and clear. Soft palate rises symmetrically and there is no tongue deviation. Hearing is intact to conversational tone. Motor: Strength is at least antigravity x 4.   Shoulder shrug is equal and symmetric.  There is no pronator drift.  Movement examination: Tone: unable Abnormal movements: There is left upper extremity tremor when he ambulates Coordination:  There is slowness of rapid alternating movements, but no true decremation. Gait and Station: The patient ambulates well in his home.  He slightly drags the left leg.  He has left upper extremity tremor with ambulation.    Assessment and Plan:   1.  Idiopathic Parkinson's disease, by history             -Patient will continue with carbidopa/levodopa 25/100, 2 tablets at 7 AM, 2 tablets at 10 AM 2 tablets at 2 PM and 1 tablet at 6 PM, in addition to carbidopa/levodopa 50/200 at bedtime.               -continue comtan 200 mg tid with first 3 dosages of carbidopa/levodopa  -This is the first time that I have met his daughter and we talked about nature/pathophysiology of Parkinson's disease.  We talked about regular daily schedule.  We talked about fluctuations with Parkinson's disease.  We talked about that this is generally not a painful condition.  -Patient's daughter has met with our social worker, Judson Roch, in the past.  Encouraged her to use Judson Roch as a Theatre manager.  Discussed the caregiver group as well as the online support group.   2.  PDD with hallucinations             -Nuplazid resolved his hallucinations.  He is free of hallucinations.    -Needs consistent caregiving.  Daughter states that patient has been with her consistently.  She states that she is delivering the medications.  3.  REM behavior disorder             -He was on  low-dose clonazepam, 0.25 mg.  They have stopped that.  At this point, I did not restarted.  We will just need to watch him closely.  4.  Leg pain             -Follows with Dr. Lynann Bologna  -Patient is apparently going to see primary care later today for some pain medication.  I did talk to the patient's daughter about trying to avoid too much pain medication, as oftentimes Parkinson's patients will get confusion/hallucinations with pain medication.  5.  Urinary urgency/frequency/incontinence             -he  needs to f/u with urology.  I will also try to get a copy of those records. Addendum:  Requested records and received note that pt didn't show for his April 17 appt  Follow Up Instructions:  4-6 months  -I discussed the assessment and treatment plan with the patient. The patient was provided an opportunity to ask questions and all were answered. The patient agreed with the plan and demonstrated an understanding of the instructions.   The patient was advised to call back or seek an in-person evaluation if the symptoms worsen or if the condition fails to improve as anticipated.    Total Time spent in visit with the patient was:  20 min, of which more than 50% of the time was spent in counseling.   Pt understands and agrees with the plan of care outlined.     Alonza Bogus, DO

## 2019-05-21 ENCOUNTER — Encounter: Payer: Self-pay | Admitting: Neurology

## 2019-05-22 ENCOUNTER — Telehealth (INDEPENDENT_AMBULATORY_CARE_PROVIDER_SITE_OTHER): Payer: Medicare HMO | Admitting: Neurology

## 2019-05-22 ENCOUNTER — Other Ambulatory Visit: Payer: Self-pay

## 2019-05-22 DIAGNOSIS — M79606 Pain in leg, unspecified: Secondary | ICD-10-CM

## 2019-05-22 DIAGNOSIS — G20 Parkinson's disease: Secondary | ICD-10-CM | POA: Diagnosis not present

## 2019-05-22 DIAGNOSIS — F0280 Dementia in other diseases classified elsewhere without behavioral disturbance: Secondary | ICD-10-CM

## 2019-05-22 DIAGNOSIS — R441 Visual hallucinations: Secondary | ICD-10-CM

## 2019-05-22 DIAGNOSIS — R350 Frequency of micturition: Secondary | ICD-10-CM

## 2019-05-22 DIAGNOSIS — G4752 REM sleep behavior disorder: Secondary | ICD-10-CM | POA: Diagnosis not present

## 2019-05-22 DIAGNOSIS — R443 Hallucinations, unspecified: Secondary | ICD-10-CM | POA: Diagnosis not present

## 2019-05-22 DIAGNOSIS — R32 Unspecified urinary incontinence: Secondary | ICD-10-CM

## 2019-05-28 ENCOUNTER — Other Ambulatory Visit: Payer: Self-pay

## 2019-05-28 ENCOUNTER — Telehealth: Payer: Self-pay

## 2019-05-28 ENCOUNTER — Ambulatory Visit (INDEPENDENT_AMBULATORY_CARE_PROVIDER_SITE_OTHER): Payer: Medicare HMO | Admitting: Pulmonary Disease

## 2019-05-28 DIAGNOSIS — J439 Emphysema, unspecified: Secondary | ICD-10-CM

## 2019-05-28 DIAGNOSIS — J984 Other disorders of lung: Secondary | ICD-10-CM

## 2019-05-28 NOTE — Telephone Encounter (Signed)
lmtcb X1 for pt/daughter. Per today's televisit, we need to schedule pt for an inhaler teaching on pharmacy's schedule.  Please schedule this when patient calls back.  Thanks!

## 2019-05-28 NOTE — Patient Instructions (Addendum)
Mixed obstructive and restrictive lung defect Recent COVID-19 pneumonia --STOP Breo --START Stioloto TWO puffs ONCE a day. We will schedule in-clinic inhaler teaching --CONTINUE Albuterol as needed for shortness of breath or wheezing --CONTINUE being active at home to increase your heart and lung strength  Follow-up in 3 months

## 2019-05-28 NOTE — Progress Notes (Signed)
Virtual Visit via Telephone Note  I connected with Theodore King on 05/28/19 at 11:30 AM EST by telephone and verified that I am speaking with the correct person using two identifiers.  Location: Patient: Home Provider: [facility]   I discussed the limitations, risks, security and privacy concerns of performing an evaluation and management service by telephone and the availability of in person appointments. I also discussed with the patient that there may be a patient responsible charge related to this service. The patient expressed understanding and agreed to proceed.  I discussed the assessment and treatment plan with the patient. The patient was provided an opportunity to ask questions and all were answered. The patient agreed with the plan and demonstrated an understanding of the instructions.   The patient was advised to call back or seek an in-person evaluation if the symptoms worsen or if the condition fails to improve as anticipated.  I provided 22 minutes of non-face-to-face time during this encounter.   Jess Sulak Rodman Pickle, MD  Subjective:   PATIENT ID: Theodore King GENDER: male DOB: 08-20-1945, MRN: [id number]   HPI  Chief Complaint  Patient presents with  . televisit    pt doing well per daughter, is only using albuterol inhaler.     [name] is a 73 year old male with idiopathic Parkinson's disease and recent COVID-19 pneumonia who presents for follow-up. Daughter is also on the phone during this visit.  He still has dyspnea on exertion daily. Denies associated wheezing or cough. He reports using albuterol 1-2 times a day. He was previously prescribed Stioloto but is not actively taking this. He no longer is receiving physical therapy but walking around the house independently.   Social History: Former smoker. Quit 30 years ago. Smoked 8-9 years with pack lasting one week.  I have personally reviewed patient's past medical/family/social  history/allergies/current medications.  Outpatient Medications Prior to Visit  Medication Sig Dispense Refill  . albuterol (VENTOLIN HFA) 108 (90 Base) MCG/ACT inhaler Inhale 2 puffs into the lungs every 4 (four) hours as needed for wheezing or shortness of breath.     . carbidopa-levodopa (SINEMET IR) 25-100 MG tablet TAKE 2 TABLETS BY MOUTH AT 7 AM , 2 TABLETS AT 10 AM , 2 TABLETS AT 2 PM, 1 TABLET AT 6 PM 112 tablet 0  . entacapone (COMTAN) 200 MG tablet TAKE 1 TABLET BY MOUTH THREE TIMES DAILY TAKE  1  AT  7AM,  10AM,  2PM. (Patient taking differently: Take 200 mg by mouth 3 (three) times daily. TAKE 1 TABLET BY MOUTH THREE TIMES DAILY TAKE  1  AT  7AM,  10AM,  2PM.) 270 tablet 0  . etodolac (LODINE) 400 MG tablet Take 400 mg by mouth 2 (two) times daily as needed for pain.    Marland Kitchen ibuprofen (ADVIL,MOTRIN) 400 MG tablet Take 400 mg by mouth every 6 (six) hours as needed.    . meclizine (ANTIVERT) 25 MG tablet Take 25 mg by mouth 2 (two) times daily as needed for dizziness.     . metFORMIN (GLUCOPHAGE) 500 MG tablet Take 500 mg by mouth daily with breakfast.     . NUPLAZID 34 MG CAPS TAKE 1 CAPSULE BY MOUTH ONCE DAILY 30 capsule 5  . vitamin C (VITAMIN C) 500 MG tablet Take 1 tablet (500 mg total) by mouth daily. 30 tablet 0  . Tiotropium Bromide-Olodaterol (STIOLTO RESPIMAT) 2.5-2.5 MCG/ACT AERS Inhale 2 puffs into the lungs daily. (Patient  not taking: Reported on 05/28/2019) 4 g 6   No facility-administered medications prior to visit.    Review of Systems  Constitutional: Negative for chills, diaphoresis, fever, malaise/fatigue and weight loss.  HENT: Negative for congestion.   Respiratory: Positive for shortness of breath. Negative for cough, hemoptysis, sputum production and wheezing.   Cardiovascular: Negative for chest pain, palpitations and leg swelling.     Objective:   There were no vitals filed for this visit.    Physical Exam: No distress noted on phone call  Data  Reviewed  Chest imaging: CTA 04/07/19 - Bilateral lower lobe infiltrates, chronic left hemidiaphragm elevation CXR 03/27/18 - Chronic left hemidiaphragm elevation compared to prior imaging in EMR  PFT:  08/15/18 FVC 2.6 (69%) FEV1 1.6 (57%) Ratio 68  TLC 76% DLCO 96% Interpretation: Mixed moderately severe obstructive and restrictive defect. No significant BD effect. DLCO normal. Flow-volume curve suggestive of variable extrathoracic airway obstruction  COVID-19 Labs  No results for input(s): DDIMER, FERRITIN, LDH, CRP in the last 72 hours.  Lab Results  Component Value Date   [place] (A) 04/07/2019   Imaging, labs and tests noted above have been reviewed independently by me.    Assessment & Plan:   Discussion: 73 year old male with mixed obstructive and restrictive lung defect, recent COVID-19 pneumonia and idiopathic Parkinson's disease. Has not been compliant with previously prescribed Stiolto and he continues to remain dyspneic. Daughter aware of plan.  Mixed obstructive and restrictive lung defect Recent COVID-19 pneumonia --STOP Breo --START Stioloto TWO puffs ONCE a day. We will schedule in-clinic inhaler teaching --CONTINUE Albuterol as needed for shortness of breath or wheezing --CONTINUE being active at home to increase your heart and lung strength    No orders of the defined types were placed in this encounter.  No orders of the defined types were placed in this encounter.  Return in about 3 months (around 08/26/2019).    Karsin Pesta Rodman Pickle, MD [facility] 05/28/2019 12:04 PM  Personal pager: [phone] If unanswered, please page CCM On-call: [phone]

## 2019-07-02 NOTE — Progress Notes (Signed)
Spoke with daughter Warren Lacy [phone]) in reference to item(s) in safe. She stated that she will be by today (07-02-2019) to pick up.

## 2019-08-17 ENCOUNTER — Other Ambulatory Visit: Payer: Self-pay | Admitting: Neurology

## 2019-09-03 ENCOUNTER — Other Ambulatory Visit: Payer: Self-pay

## 2019-09-03 NOTE — Telephone Encounter (Signed)
Etodolac refill refused.  Patient should have enough entacapone.  It was sent in 08/20/2019.

## 2019-09-03 NOTE — Telephone Encounter (Signed)
[facility] called stating they do not have script for 90 day supply of ETODOLAC 400 MG. Please provide [employer] with prescription & also write for pt to receive two week supply at local [facility] on [address] [facility] [phone]. RX [id number] JAZMIN.

## 2019-09-03 NOTE — Telephone Encounter (Signed)
I've never prescribed this to the patient and this medication is not something that I prescribe. Confirm this is medication they need.   Are we sure that they weren't calling for entacapone?

## 2019-09-05 ENCOUNTER — Other Ambulatory Visit: Payer: Self-pay

## 2019-09-05 NOTE — Telephone Encounter (Signed)
You sent me this RX request the other day as well.  I don't prescribe this medication for the patient.  Will need to call prescribing physician.  I have never prescribed this for him.  thanks

## 2019-09-11 ENCOUNTER — Other Ambulatory Visit: Payer: Self-pay

## 2019-09-11 NOTE — Telephone Encounter (Signed)
This has now been sent to me on 3 different occasions.  I don't prescribe this medication and never have.  Please decline.

## 2019-09-14 ENCOUNTER — Other Ambulatory Visit: Payer: Self-pay

## 2019-09-25 NOTE — Progress Notes (Signed)
Same day cx - not seen

## 2019-09-26 ENCOUNTER — Encounter (INDEPENDENT_AMBULATORY_CARE_PROVIDER_SITE_OTHER): Payer: Medicare HMO | Admitting: Neurology

## 2019-09-26 ENCOUNTER — Other Ambulatory Visit: Payer: Self-pay

## 2019-09-26 ENCOUNTER — Telehealth: Payer: Self-pay

## 2019-09-26 ENCOUNTER — Encounter: Payer: Self-pay | Admitting: Neurology

## 2019-09-26 NOTE — Telephone Encounter (Signed)
Called patient to do work up for todays virtual visit and daughter stated she would prefer to have her dad seen in the office.   Spoke with Dr Tat she is agreeable with patient rescheduling to an in office appt.   Patients daughter has been notified and voiced understanding.   Message sent to schedulers.

## 2019-10-09 ENCOUNTER — Telehealth: Payer: Self-pay

## 2019-10-09 NOTE — Telephone Encounter (Signed)
Received fax from [facility] stating they have not be able to reach the patient to mail him the rx for Nuplazid.   Spoke with patients daughter [name] and she stated she has not received a phone call from [facility]. She states she will give them a call once she gets settled at work. Offer [employer] phone number but she states she already has it. She thanked me for calling.

## 2019-10-26 ENCOUNTER — Other Ambulatory Visit: Payer: Self-pay | Admitting: Neurology

## 2019-10-26 NOTE — Telephone Encounter (Signed)
We are not going to be able to do this.  See multiple other previous messages (for years).  He doesn't do well when his girlfriend and daughter bounce patient back and forth between houses (they don't get along and don't communicate about his care).   Pt does well when he stays in one place (usually with daughter) and actually gets his medication on time and properly.  When he doesn't, he doesn't do well.  When he misses appointments, he also doesn't do well.  This unfortunately is a social issue that we have tried through multiple social workers to help them work out

## 2019-10-26 NOTE — Telephone Encounter (Signed)
Spoke with Girlfriend and she states the patient wants a change or increase in his meds because he does not feel like its working. He has not seen his pcp since April. She requested we get the patient in sooner. Informed her that we can not get the patient in sooner because he missed his last appt. She voiced understanding.

## 2019-10-26 NOTE — Telephone Encounter (Signed)
Patient's girlfriend called with concerns the patient is not doing well. She's like to speak with a nurse to give more information.   Patient added to wait list per request. He missed his appointment with Dr. Carles Collet in April 2021.

## 2019-10-29 ENCOUNTER — Other Ambulatory Visit: Payer: Self-pay | Admitting: Neurology

## 2019-10-30 ENCOUNTER — Other Ambulatory Visit: Payer: Self-pay | Admitting: Internal Medicine

## 2019-10-30 ENCOUNTER — Ambulatory Visit
Admission: RE | Admit: 2019-10-30 | Discharge: 2019-10-30 | Disposition: A | Discharge status: Home / Self Care | Payer: Medicare HMO | Source: Ambulatory Visit | Attending: Internal Medicine | Admitting: Internal Medicine

## 2019-10-30 DIAGNOSIS — M25561 Pain in right knee: Secondary | ICD-10-CM

## 2019-10-30 DIAGNOSIS — R609 Edema, unspecified: Secondary | ICD-10-CM

## 2019-10-30 DIAGNOSIS — M25661 Stiffness of right knee, not elsewhere classified: Secondary | ICD-10-CM

## 2019-10-30 NOTE — Telephone Encounter (Signed)
Spoke with patients daughter and advised her to send rx request to patients pcp. She voiced understanding.

## 2019-10-30 NOTE — Telephone Encounter (Addendum)
Patient stopped by the office to request refills on his Etodolac 400 MG. He said he is completely out of the medication.   Please call [phone] about this, per patient today.  [facility] in [address]

## 2019-11-06 ENCOUNTER — Telehealth: Payer: Self-pay | Admitting: Neurology

## 2019-11-06 ENCOUNTER — Other Ambulatory Visit: Payer: Self-pay | Admitting: Neurology

## 2019-11-06 NOTE — Telephone Encounter (Signed)
Patient called in wanting to talk with a nurse about his medications and needing a refill.

## 2019-11-06 NOTE — Telephone Encounter (Signed)
Spoke with Theodore King and informed her that I spoke with patients daughter last month and she stated she was going to contact [facility] about her dad's medication. I advised Theodore King to speak with the patients daughter to see what is going on with the patients medication. I explained that its hard for our office to be in the middle that its best for them to communicate with each other to give the patient the best care. She voiced understanding and thanked me for calling.

## 2019-11-06 NOTE — Telephone Encounter (Signed)
See prior message.  Looks like they couldn't get ahold of them for the nuplazid.  It has to come through mail order.

## 2019-11-06 NOTE — Telephone Encounter (Signed)
Patient needs refill of carbidopa-levodopa, entacapone, and nuplazid. Caller states they're supposed to come through the mail but they haven't come yet. Please call.

## 2019-11-07 NOTE — Telephone Encounter (Signed)
Left message for patient to contact office.

## 2019-11-08 NOTE — Telephone Encounter (Signed)
Attempted to call patient but kept getting hung up on!   Finally reached patient and advised patient that he needs to speak with his daughter. Informed him that [employer] tried to reach him but was unbable to and that's why they are not mailing his rx's. Informed him to follow up with his daughter as I spoke with her last month and gave her the number to contact the insurance. He voiced understanning and stated he would reach out to his daughter with in the next hour.

## 2019-11-13 ENCOUNTER — Telehealth: Payer: Self-pay | Admitting: Neurology

## 2019-11-13 NOTE — Telephone Encounter (Signed)
Patient called and said his carbidopa-levodopa tablets for his bedtime dosage have expired. He said he needs a new prescription sent in.  [facility] [address]

## 2019-11-14 ENCOUNTER — Other Ambulatory Visit: Payer: Self-pay

## 2019-11-14 MED ORDER — CARBIDOPA-LEVODOPA 25-100 MG PO TABS: ORAL_TABLET | ORAL | 0 refills | Status: DC

## 2019-11-14 NOTE — Telephone Encounter (Signed)
Refilled x one no refills, patient was seen in dec.2000, has appt with Dr. Carles Collet 02/06/2020.

## 2019-11-19 ENCOUNTER — Telehealth: Payer: Self-pay | Admitting: Neurology

## 2019-11-19 NOTE — Telephone Encounter (Signed)
Left message with the after hour service on 09-21-19 -@ 12:10 pm   Caller states she is with [facility] @ [phone] and that the Carbidopa levodopa has no directions and patient wife states that it is the wrong dose   Please call

## 2019-11-19 NOTE — Telephone Encounter (Signed)
Called pharmacy per last ov note dec, just fyi patient is not taken the extended relase at night per Pharmacist. He only got in filled back in September and only got 14 tablets.

## 2019-11-21 ENCOUNTER — Other Ambulatory Visit: Payer: Self-pay

## 2019-11-21 MED ORDER — CARBIDOPA-LEVODOPA ER 50-200 MG PO TBCR: 1.0000 | EXTENDED_RELEASE_TABLET | Freq: Every day | ORAL | 1 refills | Status: DC

## 2019-11-21 MED ORDER — CARBIDOPA-LEVODOPA 25-100 MG PO TABS: ORAL_TABLET | ORAL | 2 refills | Status: DC

## 2019-11-21 NOTE — Telephone Encounter (Signed)
Rx(s) sent to pharmacy electronically.  

## 2019-11-23 ENCOUNTER — Telehealth: Payer: Self-pay

## 2019-11-23 NOTE — Telephone Encounter (Signed)
Patient showed up to office with his girlfriend requesting assistance because he can not get his Carbidopa levodopa refilled. Explained to patient that the rx was sent over on 11/21/2019 for a ninety day supply. His girlfriend states she went to pick up the rx but was told it was too early to pickup. AdviTried to contact pharmacy but they were closed for lunch I will try again at 2pm.  2:03pmsed patient that I would contact his pharmacy. He voiced understanding.   Contacted pharmacy twice but no answer I will try to contact them at 2:30pm. Made patient aware that I was unable to reach the pharmacy and patient stated that it can be hard to reach pharmacy. Patient left and requested I give him a call once I hear back from the pharmacy.

## 2019-11-23 NOTE — Telephone Encounter (Signed)
Spoke with pharmacist and  [employer], the patients insurance, will not allow patient to fill rx until 01/07/2020. He states the patient needs to contact his insurance.    Patient voiced understanding.

## 2020-01-27 ENCOUNTER — Other Ambulatory Visit: Payer: Self-pay | Admitting: Neurology

## 2020-01-27 DIAGNOSIS — F060 Psychotic disorder with hallucinations due to known physiological condition: Secondary | ICD-10-CM

## 2020-01-27 DIAGNOSIS — G20 Parkinson's disease: Secondary | ICD-10-CM

## 2020-01-29 NOTE — Telephone Encounter (Signed)
Rx(s) sent to pharmacy electronically.  

## 2020-02-06 NOTE — Progress Notes (Deleted)
Assessment/Plan:    ***1.  Idiopathic Parkinson's disease, by history  -Issues continue with consistent family support, which is really the patient's biggest barrier to good care.  Have discussed this with patient's girlfriend and daughter multiple times, with several social workers over the years, but unfortunately this issue has not been resolved.  Medication management is also inconsistent because of this, as is his showing up for appointments.             -Patient is to continue carbidopa/levodopa 25/100, 2 tablets at 7 AM/2 tablets at at 10 AM/2 tablets at 2 PM/1 tablet at 6 PM   -Patient is to continue carbidopa/levodopa 50/200 at bedtime             -For now, we will continue entacapone, 200 mg 3 times per day with first 3 dosages of levodopa.  I may stop this in the future if he has more trouble with cognition.     2.  PDD with hallucinations             -Doing well with Nuplazid, 34 mg daily.   3.  REM behavior disorder             -He was on low-dose clonazepam, 0.25 mg.  They have stopped that.  At this point, I did not restarted.  We will just need to watch him closely.   4.  Leg pain             -Followed previously with Dr. Lynann Bologna      5.  Urinary urgency/frequency/incontinence             -He has a urologist, but follow-up has been inconsistent by the patient  Subjective:   Theodore King was seen today in follow up for Parkinsons disease.  ***Patient seen in follow-up for Parkinson's disease.  He is accompanied by his daughter who supplements history.  I have not seen him since December.  He had an appointment in April, which was for a video visit, but they canceled on the same day.  Since that time, multiple phone calls have been received from patient's girlfriend.  Pt denies falls.  Pt denies lightheadedness, near syncope.  No hallucinations.  Mood has been good.  I have gotten numerous requests for prescription refills for his Lodine, which I declined, as I do not  prescribe that medication for him.  His insurance company called in May stating that they could not reach them to mail out the [place].  We talked to his daughter about that and she stated that she would call the insurance company.  A month later, his girlfriend called and stated that they had not received the medication (because insurance could not get a hold of them).  Interestingly, [facility] also reached out to [place] that the patient was not taking his bedtime dose of levodopa and only filled it 1 time last September for 2 weeks worth of medication  Current movement d/o meds:  ***Carbidopa/levodopa 25/100, 2 tablets at 7 AM, 2 tablets at 10 AM, 2 tablets at 2 PM, 1 tablet at 6 PM Carbidopa/levodopa 50/200 at bedtime (patient not taking per insurance/pharmacy) Entacapone 200 mg 3 times per day with the first 3 dosages of carbidopa/levodopa Nuplazid, 34 mg daily (we received a call in May from his insurance company that they could not reach the patient to mail him the prescription, but we did reach out to the daughter about this)  Prior medications: Clonazepam 0.25  mg.  Worked well, but they stopped it.  ALLERGIES:  No Known Allergies  CURRENT MEDICATIONS:  Outpatient Encounter Medications as of 02/08/2020  Medication Sig   albuterol (VENTOLIN HFA) 108 (90 Base) MCG/ACT inhaler Inhale 2 puffs into the lungs every 4 (four) hours as needed for wheezing or shortness of breath.    carbidopa-levodopa (SINEMET CR) 50-200 MG tablet Take 1 tablet by mouth at bedtime.   carbidopa-levodopa (SINEMET IR) 25-100 MG tablet Take 2 tablets at 7 AM, 2 tablets at 10 AM, 2 tablets at 2 PM and 1 tablet at 6 PM   entacapone (COMTAN) 200 MG tablet TAKE 1 TABLET THREE TIMES DAILY AT 7AM, 10AM AND 2PM.   etodolac (LODINE) 400 MG tablet Take 400 mg by mouth 2 (two) times daily as needed for pain.   ibuprofen (ADVIL,MOTRIN) 400 MG tablet Take 400 mg by mouth every 6 (six) hours as needed.   meclizine (ANTIVERT) 25 MG  tablet Take 25 mg by mouth 2 (two) times daily as needed for dizziness.    metFORMIN (GLUCOPHAGE) 500 MG tablet Take 500 mg by mouth daily with breakfast.    NUPLAZID 34 MG CAPS TAKE 1 CAPSULE BY MOUTH ONCE DAILY   Tiotropium Bromide-Olodaterol (STIOLTO RESPIMAT) 2.5-2.5 MCG/ACT AERS Inhale 2 puffs into the lungs daily.   vitamin C (VITAMIN C) 500 MG tablet Take 1 tablet (500 mg total) by mouth daily.   No facility-administered encounter medications on file as of 02/08/2020.    Objective:   PHYSICAL EXAMINATION:    VITALS:  There were no vitals filed for this visit.  GEN:  The patient appears stated age and is in NAD. HEENT:  Normocephalic, atraumatic.  The mucous membranes are moist. The superficial temporal arteries are without ropiness or tenderness. CV:  RRR Lungs:  CTAB Neck/HEME:  There are no carotid bruits bilaterally.  Neurological examination:  Orientation: The patient is alert and oriented x3. Cranial nerves: There is good facial symmetry with*** facial hypomimia. The speech is fluent and clear. Soft palate rises symmetrically and there is no tongue deviation. Hearing is intact to conversational tone. Sensation: Sensation is intact to light touch throughout Motor: Strength is at least antigravity x4.  Movement examination: Tone: There is ***tone in the *** Abnormal movements: *** Coordination:  There is *** decremation with RAM's, *** Gait and Station: The patient has *** difficulty arising out of a deep-seated chair without the use of the hands. The patient's stride length is ***.  The patient has a *** pull test.     I have reviewed and interpreted the following labs independently    Chemistry      Component Value Date/Time   NA 135 04/10/2019 0405   NA 141 10/22/2016 0000   K 3.5 04/10/2019 0405   CL 99 04/10/2019 0405   CO2 27 04/10/2019 0405   BUN 14 04/10/2019 0405   BUN 14 10/22/2016 0000   CREATININE 0.96 04/10/2019 0405   GLU 112 10/22/2016 0000       Component Value Date/Time   CALCIUM 8.6 (L) 04/10/2019 0405   ALKPHOS 55 04/10/2019 0405   AST 19 04/10/2019 0405   ALT 5 04/10/2019 0405   BILITOT 0.7 04/10/2019 0405       Lab Results  Component Value Date   WBC 3.6 (L) 04/10/2019   HGB 12.8 (L) 04/10/2019   HCT 39.5 04/10/2019   MCV 83.9 04/10/2019   PLT 154 04/10/2019    No results found for: TSH  Total time spent on today's visit was ***30 minutes, including both face-to-face time and nonface-to-face time.  Time included that spent on review of records (prior notes available to me/labs/imaging if pertinent), discussing treatment and goals, answering patient's questions and coordinating care.  Cc:  [name], MD

## 2020-02-07 ENCOUNTER — Other Ambulatory Visit: Payer: Self-pay | Admitting: Neurology

## 2020-02-08 ENCOUNTER — Ambulatory Visit: Payer: Medicare HMO | Admitting: Neurology

## 2020-02-08 ENCOUNTER — Encounter: Payer: Self-pay | Admitting: Neurology

## 2020-02-15 ENCOUNTER — Telehealth: Payer: Self-pay | Admitting: Neurology

## 2020-02-15 NOTE — Telephone Encounter (Signed)
Per Romie Minus patient needs a refill on all of the medications that Tat writes for patient. They use the [facility], he has appt with Tat on 04-02-20

## 2020-02-15 NOTE — Telephone Encounter (Signed)
Spoke with Dr Tat and she states the patient can receive enough medication until his appt and if he no shows his next appt he will be discharged because he already has two no shows back to back.

## 2020-02-19 ENCOUNTER — Other Ambulatory Visit: Payer: Self-pay | Admitting: Internal Medicine

## 2020-02-20 ENCOUNTER — Other Ambulatory Visit: Payer: Self-pay | Admitting: Internal Medicine

## 2020-02-20 DIAGNOSIS — R1011 Right upper quadrant pain: Secondary | ICD-10-CM

## 2020-02-27 ENCOUNTER — Other Ambulatory Visit: Payer: Medicare HMO

## 2020-03-03 ENCOUNTER — Other Ambulatory Visit: Payer: Medicare HMO

## 2020-03-11 ENCOUNTER — Ambulatory Visit
Admission: RE | Admit: 2020-03-11 | Discharge: 2020-03-11 | Disposition: A | Discharge status: Home / Self Care | Payer: Medicare HMO | Source: Ambulatory Visit | Attending: Internal Medicine | Admitting: Internal Medicine

## 2020-03-11 DIAGNOSIS — R1011 Right upper quadrant pain: Secondary | ICD-10-CM

## 2020-03-31 NOTE — Progress Notes (Signed)
Assessment/Plan:   1.  Parkinsons Disease  -Patient is to continue carbidopa/levodopa 25/100, 2 tablets at 7 AM/2 tablets at at 10 AM/2 tablets at 2 PM/1 tablet at 6 PM   -Patient is to continue carbidopa/levodopa 50/200 at bedtime             -going to d/c entacapone - his pill bottle states that it was last filled 08/26/19 for 90 days so obviously not taking regularly.  It can cause confusion.  -As with many prior visits, the biggest issue with this patient continues to be lack of continuity of consistent caregiving at home, with caregiving between the daughter and the girlfriend, who do not coordinate care.  We have been addressing this for years with myself and the social workers, but unfortunately it has not changed.  In this regard, we also discussed the no-show policy here at the office, as the last 2 visits have been same-day canceled or no showed and I have not seen him now in a year.     2.  PDD with hallucinations             -Pt no longer on nuplazid.  Insurance has called here several times in the summer stating that they could not reach the family for the co-pay in order to send the medication.  Girlfriend thought that I stopped it, when in fact it was because they didn't get the medication.  Discussed this today.  Told them to call [employer].  I will call to make sure that no refill needed but suspect that a copay is what is needed.   3.  REM behavior disorder             -He was on low-dose clonazepam, 0.25 mg.  They have stopped that.  At this point, I did not restarted.  We will just need to watch him closely.   4.  Leg pain             -Previously followed with Dr. Lynann Bologna      5.  Urinary urgency/frequency/incontinence             -He has a urologist, but follow-up has been inconsistent by the patient   Subjective:   Theodore King was seen today in follow up for Parkinsons disease.  My previous records were reviewed prior to todays visit as well as outside records  available to me.  Patient has not been seen in almost a year (last December).  His daughter same-day canceled his April appointment and they no showed his September appointment. He is accompanied by his daughter who supplements history.  Pt denies falls.  Pt denies lightheadedness, near syncope.  There are some hallucinations of a person walking beside him but that isn't often.  Mood has been good.  I have gotten numerous requests for prescription refills for his Lodine, which I declined, as I do not prescribe that medication for him.  I have received calls from none his insurance [facility]) stating that they have not been able to reach him to send the Nuplazid.  We contacted his daughter about that and she stated that she never received a phone call.  [employer] called [place] again a month after that interaction and stated that the patient still had not had medication.  Girlfriend asks today why I took him off of the medication when it was working so well.  Girlfriend prepares the pill box but no one is around to help administer  them and "sometimes he forgets them."  Current movement d/o meds:  Carbidopa/levodopa 25/100, 2 tablets at 7 AM, 2 tablets at 10 AM, 2 tablets at 2 PM, 1 tablet at 6 PM Carbidopa/levodopa 50/200 at bedtime Entacapone 200 mg 3 times per day with the first 3 dosages of carbidopa/levodopa Nuplazid, 34 mg daily (pt not taking)  Prior medications: Clonazepam 0.25 mg.  Worked well, but they stopped it.    ALLERGIES:  No Known Allergies  CURRENT MEDICATIONS:  Outpatient Encounter Medications as of 04/02/2020  Medication Sig  . albuterol (VENTOLIN HFA) 108 (90 Base) MCG/ACT inhaler Inhale 2 puffs into the lungs every 4 (four) hours as needed for wheezing or shortness of breath.   . carbidopa-levodopa (SINEMET CR) 50-200 MG tablet Take 1 tablet by mouth at bedtime.  . carbidopa-levodopa (SINEMET IR) 25-100 MG tablet Take 2 tablets at 7 AM, 2 tablets at 10 AM, 2 tablets at 2 PM and 1 tablet  at 6 PM  . entacapone (COMTAN) 200 MG tablet TAKE 1 TABLET THREE TIMES DAILY AT 7AM, 10AM AND 2PM.  . ibuprofen (ADVIL,MOTRIN) 400 MG tablet Take 400 mg by mouth every 6 (six) hours as needed.  . meclizine (ANTIVERT) 25 MG tablet Take 25 mg by mouth 2 (two) times daily as needed for dizziness.   . metFORMIN (GLUCOPHAGE) 500 MG tablet Take 500 mg by mouth daily with breakfast.   . Tiotropium Bromide-Olodaterol (STIOLTO RESPIMAT) 2.5-2.5 MCG/ACT AERS Inhale 2 puffs into the lungs daily.  . vitamin C (VITAMIN C) 500 MG tablet Take 1 tablet (500 mg total) by mouth daily.  . [DISCONTINUED] etodolac (LODINE) 400 MG tablet Take 400 mg by mouth 2 (two) times daily as needed for pain. (Patient not taking: Reported on 04/02/2020)  . [DISCONTINUED] NUPLAZID 34 MG CAPS TAKE 1 CAPSULE BY MOUTH ONCE DAILY (Patient not taking: Reported on 04/02/2020)   No facility-administered encounter medications on file as of 04/02/2020.    Objective:   PHYSICAL EXAMINATION:    VITALS:   Vitals:   04/02/20 0842  BP: (!) 142/72  Pulse: 66  SpO2: 99%  Weight: 207 lb (93.9 kg)  Height: 6' (1.829 m)    GEN:  The patient appears stated age and is in NAD. HEENT:  Normocephalic, atraumatic.  The mucous membranes are moist. The superficial temporal arteries are without ropiness or tenderness. CV:  RRR Lungs:  CTAB Neck/HEME:  There are no carotid bruits bilaterally.  Neurological examination:  Orientation: The patient is alert and oriented to person and place.  He is somewhat confused Cranial nerves: There is good facial symmetry with facial hypomimia. The speech is fluent and intermittently has word finding trouble/dysphasia. Soft palate rises symmetrically and there is no tongue deviation. Hearing is intact to conversational tone. Sensation: Sensation is intact to light touch throughout Motor: Strength is at least antigravity x4.  Movement examination: Tone: There is nl tone in the UE/LE Abnormal movements:  none Coordination:  There is  decremation with RAM's, with any form of RAMS, including alternating supination and pronation of the forearm, hand opening and closing, finger taps, heel taps and toe taps. Gait and Station: Patient pushes off of the chair to arise.   The patient's stride length is fairly good.  He carries the cane but doesn't use it.     I have reviewed and interpreted the following labs independently    Chemistry      Component Value Date/Time   NA 135 04/10/2019 0405  NA 141 10/22/2016 0000   K 3.5 04/10/2019 0405   CL 99 04/10/2019 0405   CO2 27 04/10/2019 0405   BUN 14 04/10/2019 0405   BUN 14 10/22/2016 0000   CREATININE 0.96 04/10/2019 0405   GLU 112 10/22/2016 0000      Component Value Date/Time   CALCIUM 8.6 (L) 04/10/2019 0405   ALKPHOS 55 04/10/2019 0405   AST 19 04/10/2019 0405   ALT 5 04/10/2019 0405   BILITOT 0.7 04/10/2019 0405       Lab Results  Component Value Date   WBC 3.6 (L) 04/10/2019   HGB 12.8 (L) 04/10/2019   HCT 39.5 04/10/2019   MCV 83.9 04/10/2019   PLT 154 04/10/2019    No results found for: TSH   Total time spent on today's visit was 40 minutes, including both face-to-face time and nonface-to-face time.  Time included that spent on review of records (prior notes available to me/labs/imaging if pertinent), discussing treatment and goals, answering patient's questions and coordinating care.  Cc:  [name], MD

## 2020-04-02 ENCOUNTER — Other Ambulatory Visit: Payer: Self-pay

## 2020-04-02 ENCOUNTER — Encounter: Payer: Self-pay | Admitting: Neurology

## 2020-04-02 ENCOUNTER — Ambulatory Visit (INDEPENDENT_AMBULATORY_CARE_PROVIDER_SITE_OTHER): Payer: Medicare HMO | Admitting: Neurology

## 2020-04-02 VITALS — BP 142/72 | HR 66 | Ht 72.0 in | Wt 207.0 lb

## 2020-04-02 DIAGNOSIS — F0280 Dementia in other diseases classified elsewhere without behavioral disturbance: Secondary | ICD-10-CM | POA: Diagnosis not present

## 2020-04-02 DIAGNOSIS — R441 Visual hallucinations: Secondary | ICD-10-CM | POA: Diagnosis not present

## 2020-04-02 DIAGNOSIS — G20 Parkinson's disease: Secondary | ICD-10-CM | POA: Diagnosis not present

## 2020-04-02 MED ORDER — CARBIDOPA-LEVODOPA ER 50-200 MG PO TBCR
1.0000 | EXTENDED_RELEASE_TABLET | Freq: Every day | ORAL | 1 refills | Status: DC
Start: 2020-04-02 — End: 2021-09-24

## 2020-04-02 NOTE — Patient Instructions (Addendum)
1.  Take carbidopa/levodopa 25/100, 2 tablets at 7 AM/2 tablets at at 10 AM/2 tablets at 2 PM/1 tablet at 6 PM  2.   continue carbidopa/levodopa 50/200 at bedtime 3.  Restart Nuplazid - you need to call [employer] about this RX 4.  STOP entacapone  The physicians and staff at [facility] are committed to providing excellent care. You may receive a survey requesting feedback about your experience at our office. We strive to receive "very good" responses to the survey questions. If you feel that your experience would prevent you from giving the office a "very good " response, please contact our office to try to remedy the situation. We may be reached at [phone]. Thank you for taking the time out of your busy day to complete the survey.

## 2020-04-07 ENCOUNTER — Other Ambulatory Visit: Payer: Self-pay | Admitting: Neurology

## 2020-04-07 MED ORDER — NUPLAZID 34 MG PO CAPS: 34.0000 mg | ORAL_CAPSULE | Freq: Every day | ORAL | 2 refills | Status: DC

## 2020-04-07 NOTE — Telephone Encounter (Signed)
Romie Minus (unsure of relationship to patient- wouldn't specify) states that patient is out of nuplazid and doesn't know the number to call to get it refilled. Notified caller we aren't able to share any information for her but I would send the message back and that they would return the call to patient.

## 2020-04-07 NOTE — Telephone Encounter (Signed)
Patient called in stating he was out of his Nuplazid and needs it to be sent to [facility]

## 2020-04-07 NOTE — Telephone Encounter (Signed)
Rx(s) sent to pharmacy electronically.  Patient notified and voiced understanding.  

## 2020-04-11 ENCOUNTER — Telehealth: Payer: Self-pay

## 2020-04-11 NOTE — Telephone Encounter (Signed)
Received voicemail from [place] requesting a ICD code for Nuplazid.  [place] and gave him the ICD codes. He states it will be best to add the ICD code to the rx.   He states he has been trying to contact the patient to finish the welcome call.

## 2020-04-16 ENCOUNTER — Encounter (HOSPITAL_COMMUNITY): Payer: Self-pay

## 2020-04-16 ENCOUNTER — Other Ambulatory Visit: Payer: Self-pay

## 2020-04-16 ENCOUNTER — Ambulatory Visit (HOSPITAL_COMMUNITY)
Admission: EM | Admit: 2020-04-16 | Discharge: 2020-04-16 | Disposition: A | Discharge status: Home / Self Care | Payer: Medicare HMO | Attending: Family Medicine | Admitting: Family Medicine

## 2020-04-16 DIAGNOSIS — R3915 Urgency of urination: Secondary | ICD-10-CM | POA: Insufficient documentation

## 2020-04-16 DIAGNOSIS — R1011 Right upper quadrant pain: Secondary | ICD-10-CM | POA: Insufficient documentation

## 2020-04-16 LAB — POCT URINALYSIS DIPSTICK, ED / UC
Bilirubin Urine: NEGATIVE
Glucose, UA: NEGATIVE mg/dL
Hgb urine dipstick: NEGATIVE
Ketones, ur: 15 mg/dL — AB
Nitrite: NEGATIVE
Protein, ur: NEGATIVE mg/dL
Specific Gravity, Urine: 1.025 (ref 1.005–1.030)
Urobilinogen, UA: 1 mg/dL (ref 0.0–1.0)
pH: 6 (ref 5.0–8.0)

## 2020-04-16 MED ORDER — CEPHALEXIN 500 MG PO CAPS: 500.0000 mg | ORAL_CAPSULE | Freq: Two times a day (BID) | ORAL | 0 refills | Status: DC

## 2020-04-16 MED ORDER — TAMSULOSIN HCL 0.4 MG PO CAPS
0.4000 mg | ORAL_CAPSULE | Freq: Every day | ORAL | 1 refills | Status: DC
Start: 2020-04-16 — End: 2022-04-04

## 2020-04-16 NOTE — Discharge Instructions (Signed)
You have been seen today for abdominal pain. Your evaluation was not suggestive of any emergent condition requiring medical intervention at this time. However, some abdominal problems make take more time to appear. Therefore, it is very important for you to pay attention to any new symptoms or worsening of your current condition.  Please return here or to the Emergency Department immediately should you begin to feel worse in any way or have any of the following symptoms: increasing or different abdominal pain, persistent vomiting, inability to drink fluids, fevers, or shaking chills.   

## 2020-04-16 NOTE — ED Triage Notes (Signed)
Pt c/o pain to RUQ toward right right back area for approx 2 days. Pt's daughter states pt had complained of pain from knee to neck on right side. Also c/o urine stream that is intermittent in nature.  Denies n/v/d, fever, blood in urine.  Has taken Doan's today, BC powder today as well. Quit taking flomax approx 3-4 weeks ago.

## 2020-04-17 LAB — URINE CULTURE

## 2020-04-17 NOTE — ED Provider Notes (Addendum)
[place]   [id number] 04/16/20 Arrival Time: 1952  ASSESSMENT & PLAN:  1. Right upper quadrant abdominal pain   2. Urinary urgency     Different from initial history he does reports significant urge to urinate over the past 1-2 days. Will empirically treat for UTI. Urine culture sent. Also requests refill of Flomax. Benign abd exam. No indications for urgent abdominal/pelvic imaging at this time. Discussed.  Begin: Meds ordered this encounter  Medications  . tamsulosin (FLOMAX) 0.4 MG CAPS capsule    Sig: Take 1 capsule (0.4 mg total) by mouth daily.    Dispense:  30 capsule    Refill:  1  . cephALEXin (KEFLEX) 500 MG capsule    Sig: Take 1 capsule (500 mg total) by mouth 2 (two) times daily.    Dispense:  14 capsule    Refill:  0     Discharge Instructions     You have been seen today for abdominal pain. Your evaluation was not suggestive of any emergent condition requiring medical intervention at this time. However, some abdominal problems make take more time to appear. Therefore, it is very important for you to pay attention to any new symptoms or worsening of your current condition.  Please return here or to the Emergency Department immediately should you begin to feel worse in any way or have any of the following symptoms: increasing or different abdominal pain, persistent vomiting, inability to drink fluids, fevers, or shaking chills.       Follow-up Information    [name], MD.   Specialty: Internal Medicine Why: As needed. Contact information: [address]  [place] [address] [phone]        [place].   Specialty: Emergency Medicine Why: If symptoms worsen in any way. Contact information: [address] [id number] [place] [phone]              Reviewed expectations re: course of current medical issues. Questions answered. Outlined  signs and symptoms indicating need for more acute intervention. Patient verbalized understanding. After Visit Summary given.   SUBJECTIVE: History from: patient and daughter. Theodore King is a 74 y.o. male who presents with complaint of intermittent R-sided abdominal discomfort. Onset gradual, first mentioned to daughter today; questions feeling over past couple of days. Discomfort described as aching; without radiation; does not wake him at night. Reports normal flatus. Symptoms are stable since beginning. Fever: absent. Aggravating factors: have not been identified. Alleviating factors: have not been identified. Associated symptoms: mild fatigue. He denies arthralgias, belching, chills, fever, headache, hematuria and myalgias. Appetite: normal. PO intake: normal. Ambulatory without assistance. Reports normal non-bloody bowel movements. History of similar: no. OTC treatment: none reported.   Past Surgical History:  Procedure Laterality Date  . APPENDECTOMY    . ROTATOR CUFF REPAIR     right     OBJECTIVE:  Vitals:   04/16/20 2040  BP: (!) 117/56  Pulse: 62  Resp: 16  Temp: 98.2 F (36.8 C)  TempSrc: Oral  SpO2: 100%    General appearance: alert, oriented, no acute distress HEENT: [place]; AT; oropharynx moist Lungs: unlabored respirations Abdomen: soft; healed surgical scars over lower abdomen; without distention; mild  and poorly localized tenderness to palpation over RUQ; normal bowel sounds; without masses or organomegaly; without guarding or rebound tenderness Back: without reported CVA tenderness; FROM at waist Extremities: without LE edema; symmetrical; without gross deformities Skin: warm and dry Neurologic:  normal gait Psychological: alert and cooperative; normal mood and affect  Labs: Results for orders placed or performed during the hospital encounter of 04/16/20  POCT Urinalysis Dipstick (ED/UC)  Result Value Ref Range   Glucose, UA NEGATIVE NEGATIVE mg/dL    Bilirubin Urine NEGATIVE NEGATIVE   Ketones, ur 15 (A) NEGATIVE mg/dL   Specific Gravity, Urine 1.025 1.005 - 1.030   Hgb urine dipstick NEGATIVE NEGATIVE   pH 6.0 5.0 - 8.0   Protein, ur NEGATIVE NEGATIVE mg/dL   Urobilinogen, UA 1.0 0.0 - 1.0 mg/dL   Nitrite NEGATIVE NEGATIVE   Leukocytes,Ua SMALL (A) NEGATIVE   Labs Reviewed  POCT URINALYSIS DIPSTICK, ED / UC - Abnormal; Notable for the following components:      Result Value   Ketones, ur 15 (*)    Leukocytes,Ua SMALL (*)    All other components within normal limits  URINE CULTURE    Imaging: No results found.   No Known Allergies                                             Past Medical History:  Diagnosis Date  . Abnormal dreams 09/06/2013  . Disorders of bursae and tendons in shoulder region, unspecified   . Hyperglycemia 05/18/2013  . Hyperlipidemia LDL goal < 100 05/18/2013  . Other and unspecified hyperlipidemia   . Other and unspecified hyperlipidemia   . 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 Paralysis agitans ([place]) 05/18/2013  . Parkinson's disease ([place])   . Pneumonia due to COVID-19 virus 04/08/2019  . 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 SOB (shortness of breath) 03/23/2018  . Unspecified vitamin D deficiency   . Unspecified vitamin D deficiency   . Vertigo, benign paroxysmal 11/05/2016  . Vitamin D deficiency 05/18/2013    Social History   Socioeconomic History  . Marital status: Legally Separated    Spouse name: Not on file  . Number of children: 3  . Years of education: 11th  . Highest education level: 11th grade  Occupational History    Employer: RETIRED  Tobacco Use  . Smoking status: Former Smoker    Packs/day: 0.50    Years: 20.00    Pack years: 10.00    Types: Cigarettes    Start date: 08/14/1953    Quit date: 05/23/1974    Years since quitting: 45.9  . Smokeless tobacco: Never Used  Vaping Use  . Vaping Use: Never used  Substance and Sexual Activity  . Alcohol use:  Not Currently  . Drug use: No  . Sexual activity: Never  Other Topics Concern  . Not on file  Social History Narrative  . Not on file   Social Determinants of Health   Financial Resource Strain:   . Difficulty of Paying Living Expenses: Not on file  Food Insecurity:   . Worried About Charity fundraiser in the Last Year: Not on file  . Ran Out of Food in the Last Year: Not on file  Transportation Needs:   . Lack of Transportation (Medical): Not on file  . Lack of Transportation (Non-Medical): Not on file  Physical Activity:   . Days of Exercise per Week: Not on file  . Minutes of Exercise per Session: Not on file  Stress:   . Feeling of Stress : Not on file  Social Connections:   .  Frequency of Communication with Friends and Family: Not on file  . Frequency of Social Gatherings with Friends and Family: Not on file  . Attends Religious Services: Not on file  . Active Member of Clubs or Organizations: Not on file  . Attends Archivist Meetings: Not on file  . Marital Status: Not on file  Intimate Partner Violence:   . Fear of Current or Ex-Partner: Not on file  . Emotionally Abused: Not on file  . Physically Abused: Not on file  . Sexually Abused: Not on file    Family History  Problem Relation Age of Onset  . Cancer Mother   . Cancer Father   . Stroke Brother   . Cancer Sister   . Healthy Daughter   . Cancer Sister   . Lung cancer Brother   . Healthy Daughter   . Colon cancer Neg Hx   . Esophageal cancer Neg Hx   . Rectal cancer Neg Hx   . Stomach cancer Neg Hx      Vanessa Kick, MD 04/17/20 1047    Vanessa Kick, MD 04/17/20 1048

## 2020-04-20 ENCOUNTER — Emergency Department (HOSPITAL_COMMUNITY): Payer: Medicare HMO

## 2020-04-20 ENCOUNTER — Emergency Department (HOSPITAL_COMMUNITY)
Admission: EM | Admit: 2020-04-20 | Discharge: 2020-04-20 | Disposition: A | Discharge status: Home / Self Care | Payer: Medicare HMO | Attending: Emergency Medicine | Admitting: Emergency Medicine

## 2020-04-20 ENCOUNTER — Encounter (HOSPITAL_COMMUNITY): Payer: Self-pay

## 2020-04-20 ENCOUNTER — Other Ambulatory Visit: Payer: Self-pay

## 2020-04-20 DIAGNOSIS — R0602 Shortness of breath: Secondary | ICD-10-CM | POA: Diagnosis not present

## 2020-04-20 DIAGNOSIS — G20 Parkinson's disease: Secondary | ICD-10-CM | POA: Diagnosis not present

## 2020-04-20 DIAGNOSIS — R531 Weakness: Secondary | ICD-10-CM | POA: Diagnosis not present

## 2020-04-20 DIAGNOSIS — Z7984 Long term (current) use of oral hypoglycemic drugs: Secondary | ICD-10-CM | POA: Diagnosis not present

## 2020-04-20 DIAGNOSIS — Z7951 Long term (current) use of inhaled steroids: Secondary | ICD-10-CM | POA: Diagnosis not present

## 2020-04-20 DIAGNOSIS — R3911 Hesitancy of micturition: Secondary | ICD-10-CM | POA: Diagnosis not present

## 2020-04-20 DIAGNOSIS — Z8616 Personal history of COVID-19: Secondary | ICD-10-CM | POA: Diagnosis not present

## 2020-04-20 DIAGNOSIS — R251 Tremor, unspecified: Secondary | ICD-10-CM | POA: Diagnosis present

## 2020-04-20 LAB — URINALYSIS, ROUTINE W REFLEX MICROSCOPIC
Bacteria, UA: NONE SEEN
Bilirubin Urine: NEGATIVE
Glucose, UA: NEGATIVE mg/dL
Hgb urine dipstick: NEGATIVE
Ketones, ur: 5 mg/dL — AB
Nitrite: NEGATIVE
Protein, ur: NEGATIVE mg/dL
Specific Gravity, Urine: 1.025 (ref 1.005–1.030)
pH: 5 (ref 5.0–8.0)

## 2020-04-20 LAB — BASIC METABOLIC PANEL
Anion gap: 8 (ref 5–15)
BUN: 13 mg/dL (ref 8–23)
CO2: 26 mmol/L (ref 22–32)
Calcium: 9.1 mg/dL (ref 8.9–10.3)
Chloride: 108 mmol/L (ref 98–111)
Creatinine, Ser: 1.01 mg/dL (ref 0.61–1.24)
GFR, Estimated: >60 mL/min (ref >60)
Glucose, Bld: 86 mg/dL (ref 70–99)
Potassium: 4.1 mmol/L (ref 3.5–5.1)
Sodium: 142 mmol/L (ref 135–145)

## 2020-04-20 LAB — CBC
HCT: 40.1 % (ref 39.0–52.0)
Hemoglobin: 12.8 g/dL — ABNORMAL LOW (ref 13.0–17.0)
MCH: 27.1 pg (ref 26.0–34.0)
MCHC: 31.9 g/dL (ref 30.0–36.0)
MCV: 84.8 fL (ref 80.0–100.0)
Platelets: 172 10*3/uL (ref 150–400)
RBC: 4.73 MIL/uL (ref 4.22–5.81)
RDW: 13 % (ref 11.5–15.5)
WBC: 5.3 10*3/uL (ref 4.0–10.5)
nRBC: 0 % (ref 0.0–0.2)

## 2020-04-20 LAB — TROPONIN I (HIGH SENSITIVITY): Troponin I (High Sensitivity): 5 ng/L (ref <18)

## 2020-04-20 NOTE — ED Provider Notes (Signed)
[facility] EMERGENCY DEPARTMENT Provider Note   CSN: [id number] Arrival date & time: 04/20/20  1345     History No chief complaint on file.   Theodore King is a 74 y.o. male with history of Parkinson's who presents to the emergency department with concern that his tremors have changed and become more frequent, more noticeable.  Additionally he endorses hesitancy starting urine stream, discomfort when he urinates.  He was seen at urgent care on 04/16/2020 and treated presumptively for urinary tract infection with Keflex, which he states he has taken according to prescription instructions.  Culture from that day grew out multiple species, lab suggested recollection of sample.  In discussion with the patient his thoughts are tangential, he has difficulty articulating words, which he says is normal for him since his diagnosis of Parkinson's.  When asked specifically what changed and caused him to come to the emergency department today, he is unable to answer, continues to restate concern with worsening tremor, hesitancy with starting urine stream.  Patient keeps mentioning new finding of gallstones on recent abdominal ultrasound.  Denies current abdominal pain.  I personally reviewed cars.  He has history of Parkinson's, hyperlipidemia, mixed restrictive and obstructive lung disease, history of infection with COVID-19.  Collateral history obtained from daughter, who states that he was not clear for his reason for coming to the emergency department.  States that he frequently gets concerned regarding symptoms of Parkinson's potentially could be from other sources.  HPI     Past Medical History:  Diagnosis Date  . Abnormal dreams 09/06/2013  . Disorders of bursae and tendons in shoulder region, unspecified   . Hyperglycemia 05/18/2013  . Hyperlipidemia LDL goal < 100 05/18/2013  . Other and unspecified hyperlipidemia   . Other and unspecified hyperlipidemia   . Other  inflammatory and toxic neuropathy(357.89)   . Paralysis agitans ([place]) 05/18/2013  . Parkinson's disease ([place])   . Pneumonia due to COVID-19 virus 04/08/2019  . 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 SOB (shortness of breath) 03/23/2018  . Unspecified vitamin D deficiency   . Unspecified vitamin D deficiency   . Vertigo, benign paroxysmal 11/05/2016  . Vitamin D deficiency 05/18/2013    Patient Active Problem List   Diagnosis Date Noted  . History of 2019 novel coronavirus disease (COVID-19) 04/24/2019  . Dysphonia 10/16/2018  . 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 Former smoker 08/15/2018  . SOB (shortness of breath) 03/23/2018  . Vertigo, benign paroxysmal 11/05/2016  . Abnormal dreams 09/06/2013  . 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 Hyperglycemia 05/18/2013  . Paralysis agitans ([place]) 05/18/2013  . Vitamin D deficiency 05/18/2013  . Hyperlipidemia LDL goal < 100 05/18/2013  . Parkinson's disease ([place]) 10/26/2012    Past Surgical History:  Procedure Laterality Date  . APPENDECTOMY    . ROTATOR CUFF REPAIR     right       Family History  Problem Relation Age of Onset  . Cancer Mother   . Cancer Father   . Stroke Brother   . Cancer Sister   . Healthy Daughter   . Cancer Sister   . Lung cancer Brother   . Healthy Daughter   . Colon cancer Neg Hx   . Esophageal cancer Neg Hx   . Rectal cancer Neg Hx   . Stomach cancer Neg Hx     Social History   Tobacco Use  . Smoking status: Former Smoker  Packs/day: 0.50    Years: 20.00    Pack years: 10.00    Types: Cigarettes    Start date: 08/14/1953    Quit date: 05/23/1974    Years since quitting: 45.9  . Smokeless tobacco: Never Used  Vaping Use  . Vaping Use: Never used  Substance Use Topics  . Alcohol use: Not Currently  . Drug use: No    Home Medications Prior to Admission medications   Medication Sig Start Date End Date Taking? Authorizing Provider  albuterol  (VENTOLIN HFA) 108 (90 Base) MCG/ACT inhaler Inhale 2 puffs into the lungs every 4 (four) hours as needed for wheezing or shortness of breath.  10/03/18   [provider]  carbidopa-levodopa (SINEMET CR) 50-200 MG tablet Take 1 tablet by mouth at bedtime. 04/02/20   Tat, Eustace Quail, DO  carbidopa-levodopa (SINEMET IR) 25-100 MG tablet Take 2 tablets at 7 AM, 2 tablets at 10 AM, 2 tablets at 2 PM and 1 tablet at 6 PM 11/21/19   Tat, Rebecca S, DO  cephALEXin (KEFLEX) 500 MG capsule Take 1 capsule (500 mg total) by mouth 2 (two) times daily. 04/16/20   Vanessa Kick, MD  entacapone (COMTAN) 200 MG tablet TAKE 1 TABLET THREE TIMES DAILY AT 7AM, 10AM AND 2PM. 10/29/19   Tat, Eustace Quail, DO  ibuprofen (ADVIL,MOTRIN) 400 MG tablet Take 400 mg by mouth every 6 (six) hours as needed.    [provider]  meclizine (ANTIVERT) 25 MG tablet Take 25 mg by mouth 2 (two) times daily as needed for dizziness.  10/02/18   [provider]  metFORMIN (GLUCOPHAGE) 500 MG tablet Take 500 mg by mouth daily with breakfast.     [provider]  Pimavanserin Tartrate (NUPLAZID) 34 MG CAPS Take 1 capsule (34 mg total) by mouth daily. 04/07/20   Tat, Eustace Quail, DO  tamsulosin (FLOMAX) 0.4 MG CAPS capsule Take 1 capsule (0.4 mg total) by mouth daily. 04/16/20   Vanessa Kick, MD  Tiotropium Bromide-Olodaterol (STIOLTO RESPIMAT) 2.5-2.5 MCG/ACT AERS Inhale 2 puffs into the lungs daily. 04/24/19   Margaretha Seeds, MD  vitamin C (VITAMIN C) 500 MG tablet Take 1 tablet (500 mg total) by mouth daily. 04/11/19   Allie Bossier, MD    Allergies    Patient has no known allergies.  Review of Systems   Review of Systems  Constitutional: Negative for activity change, appetite change, chills, diaphoresis, fatigue and fever.  HENT: Negative for congestion, sinus pain and trouble swallowing.   Eyes: Negative for photophobia and visual disturbance.  Respiratory: Negative for chest tightness and shortness of  breath.   Cardiovascular: Negative for chest pain and palpitations.  Gastrointestinal: Negative for abdominal pain, nausea and vomiting.  Genitourinary: Positive for difficulty urinating. Negative for dysuria, frequency, hematuria, penile pain, scrotal swelling and testicular pain.  Musculoskeletal: Negative for arthralgias and myalgias.  Skin: Negative.     Physical Exam Updated Vital Signs BP (!) 183/81   Pulse 67   Temp 98.5 F (36.9 C) (Oral)   Resp (!) 21   Ht 6' (1.829 m)   Wt 92.1 kg   SpO2 100%   BMI 27.53 kg/m   Physical Exam Vitals and nursing note reviewed.  Constitutional:      Appearance: He is obese.  HENT:     Head: Normocephalic and atraumatic.     Nose: Nose normal.     Mouth/Throat:     Mouth: Mucous membranes are moist.  Pharynx: Oropharynx is clear. No oropharyngeal exudate or posterior oropharyngeal erythema.  Eyes:     General:        Right eye: No discharge.        Left eye: No discharge.     Extraocular Movements: Extraocular movements intact.     Conjunctiva/sclera: Conjunctivae normal.     Pupils: Pupils are equal, round, and reactive to light.  Cardiovascular:     Rate and Rhythm: Normal rate and regular rhythm.     Pulses: Normal pulses.     Heart sounds: Normal heart sounds. No murmur heard.   Pulmonary:     Effort: Pulmonary effort is normal. No respiratory distress.     Breath sounds: Examination of the left-lower field reveals decreased breath sounds. Decreased breath sounds present. No wheezing or rales.  Abdominal:     General: Bowel sounds are normal. There is no distension.     Palpations: Abdomen is soft.     Tenderness: There is no abdominal tenderness.  Musculoskeletal:        General: No deformity.     Cervical back: Neck supple. No rigidity.     Right lower leg: No edema.     Left lower leg: No edema.  Lymphadenopathy:     Cervical: No cervical adenopathy.  Skin:    General: Skin is warm and dry.     Capillary  Refill: Capillary refill takes less than 2 seconds.  Neurological:     General: No focal deficit present.     Mental Status: He is alert and oriented to person, place, and time. Mental status is at baseline.  Psychiatric:        Mood and Affect: Mood normal.     ED Results / Procedures / Treatments   Labs (all labs ordered are listed, but only abnormal results are displayed) Labs Reviewed  CBC - Abnormal; Notable for the following components:      Result Value   Hemoglobin 12.8 (*)    All other components within normal limits  URINALYSIS, ROUTINE W REFLEX MICROSCOPIC - Abnormal; Notable for the following components:   Ketones, ur 5 (*)    Leukocytes,Ua TRACE (*)    All other components within normal limits  URINE CULTURE  BASIC METABOLIC PANEL  TROPONIN I (HIGH SENSITIVITY)    EKG EKG Interpretation  Date/Time:  Sunday April 20 2020 13:53:32 EST Ventricular Rate:  68 PR Interval:  216 QRS Duration: 96 QT Interval:  384 QTC Calculation: 408 R Axis:   17 Text Interpretation: Sinus rhythm with 1st degree A-V block Minimal voltage criteria for LVH, may be normal variant ( R in aVL ) Borderline ECG No significant change since prior 10/20 Confirmed by Aletta Edouard [phone]) on 04/20/2020 5:51:52 PM   Radiology DG Chest 2 View  Result Date: 04/20/2020 CLINICAL DATA:  Short of breath beginning yesterday EXAM: CHEST - 2 VIEW COMPARISON:  04/08/2019 FINDINGS: Frontal and lateral views of the chest demonstrate an unremarkable cardiac silhouette. Chronic elevation of the left hemidiaphragm. No airspace disease, effusion, or pneumothorax. No acute bony abnormality. IMPRESSION: 1. No acute intrathoracic process. Electronically Signed   By: Randa Ngo M.D.   On: 04/20/2020 15:07    Procedures Procedures (including critical care time)  Medications Ordered in ED Medications - No data to display  ED Course  I have reviewed the triage vital signs and the nursing  notes.  Pertinent labs & imaging results that were available during my care of the  patient were reviewed by me and considered in my medical decision making (see chart for details).  Clinical Course as of Apr 20 2229  Sun Nov 14, 516  [id number] 74 year old male with history of Parkinson's disease here with increased trembling.  Is not a very good historian.  Does he feels generally weak.  Exam fairly unremarkable other than he is got a fine tremor.  Labs unremarkable.  Will review with family to make sure that they did not have other concerns.   [MB]    Clinical Course User Index [MB] Hayden Rasmussen, MD   MDM Rules/Calculators/A&P                         74 year old male with history of Parkinson's disease who presents to the emergency department for unclear cause.   Vital signs reassuring on intake, patient is mildly hypertensive to 144/70.  Patient is otherwise normal.  Physical exam reassuring, without acute abnormality.  EKG with sinus rhythm, first-degree AV block.  No STEMI.  Chest x-ray without acute intrathoracic process, chronic elevation of the left hemidiaphragm.  CBC with mild hemoglobin 12.8, previously 12.8.  BMP unremarkable.  Troponin negative, 5.  UA with ketones, trace leukocytes, otherwise unremarkable.  Given very reassuring physical exam and laboratory studies, as well as EKG and chest x-ray, I do not feel any further work-up is necessary in the emergency department at this time.  Regarding gallstones of the patient keeps mentioning, recommend he follows up with his primary care doctor for any intermittent pain he may be having related to this issue.  Theodore King voiced understanding of his medical evaluation and treatment plan.  His questions were answered to his expressed satisfaction. Return precautions given. Patient stable for discharge.  Final Clinical Impression(s) / ED Diagnoses Final diagnoses:  SOB (shortness of breath)    Rx / DC Orders ED  Discharge Orders    None       Aura Dials 04/20/20 2230    Hayden Rasmussen, MD 04/21/20 1223

## 2020-04-20 NOTE — ED Notes (Signed)
ED Provider at bedside. 

## 2020-04-20 NOTE — Discharge Instructions (Addendum)
Theodore King was seen in the emergency department today.  His vital signs, and physical exam are very reassuring.  There is no acute abnormality.  His blood work, urine test, chest x-ray, and EKG were also all normal. There is no sign of infection in his bladder his urine.  His kidney function is normal.  No sign of infection, or fluid in his lungs based on his chest x-ray. Is no sign of heart attack, or abnormal heart rhythm on his EKG.  I recommend he follow-up with his primary care doctor for the intermittent right sided belly pain that he has described.  He is not currently experiencing any belly pain, and there is no abnormal finding on his physical exam.  He may return to the emergency department if he develops new chest pain, difficulty breathing, heart palpitations, or other new severe symptoms.

## 2020-04-20 NOTE — ED Triage Notes (Signed)
Patient complains of sob last night and lower abdominal pain that resolved. denies sob on assessment. Patient alert and in no distress

## 2020-04-22 LAB — URINE CULTURE: Culture: >=100000 — AB

## 2020-04-23 ENCOUNTER — Telehealth: Payer: Self-pay | Admitting: *Deleted

## 2020-04-23 NOTE — Telephone Encounter (Signed)
Post ED Visit - Positive Culture Follow-up: Successful Patient Follow-Up  Culture assessed and recommendations reviewed by:  []  Elenor Quinones, Pharm.D. []  Heide Guile, Pharm.D., BCPS AQ-ID []  Parks Neptune, Pharm.D., BCPS []  Alycia Rossetti, Pharm.D., BCPS []  [place], Pharm.D., BCPS, AAHIVP []  Legrand Como, Pharm.D., BCPS, AAHIVP []  Salome Arnt, PharmD, BCPS []  Johnnette Gourd, PharmD, BCPS []  Hughes Better, PharmD, BCPS []  Leeroy Cha, PharmD  Positive urine culture  [x]  Patient discharged without antimicrobial prescription and treatment is now indicated []  Organism is resistant to prescribed ED discharge antimicrobial []  Patient with positive blood cultures  Changes discussed with ED provider:  Charlesetta Shanks, [place] antibiotic prescription Bactrim DS 1 tab IBD x 14 dyas Called to [facility], [phone]  Contacted patient, date 04/23/2020, time [place], [place] 04/23/2020, 11:32 AM

## 2020-04-23 NOTE — Progress Notes (Signed)
ED Antimicrobial Stewardship Positive Culture Follow Up   Theodore King is an 74 y.o. male who presented to [facility] on 04/20/2020 with a chief complaint of tremors and difficulty urinating.   Recent Results (from the past 720 hour(s))  Urine culture     Status: Abnormal   Collection Time: 04/16/20  9:00 PM   Specimen: Urine, Random  Result Value Ref Range Status   Specimen Description URINE, RANDOM  Final   Special Requests   Final    NONE Performed at [facility], [address], [place], [place] [address]    Culture MULTIPLE SPECIES PRESENT, SUGGEST RECOLLECTION (A)  Final   Report Status 04/17/2020 FINAL  Final  Urine culture     Status: Abnormal   Collection Time: 04/20/20  6:00 PM   Specimen: Urine, Random  Result Value Ref Range Status   Specimen Description URINE, RANDOM  Final   Special Requests   Final    NONE Performed at [facility], [place] [address], [place], [place] [address]    Culture >=100,000 COLONIES/mL PROTEUS PENNERI (A)  Final   Report Status 04/22/2020 FINAL  Final   Organism ID, Bacteria PROTEUS PENNERI (A)  Final      Susceptibility   Proteus penneri - MIC*    AMPICILLIN >=32 RESISTANT Resistant     CEFAZOLIN >=64 RESISTANT Resistant     CEFEPIME <=0.12 SENSITIVE Sensitive     CEFTRIAXONE 32 RESISTANT Resistant     CIPROFLOXACIN <=0.25 SENSITIVE Sensitive     GENTAMICIN <=1 SENSITIVE Sensitive     IMIPENEM 4 SENSITIVE Sensitive     NITROFURANTOIN 128 RESISTANT Resistant     TRIMETH/SULFA <=20 SENSITIVE Sensitive     AMPICILLIN/SULBACTAM 16 INTERMEDIATE Intermediate     PIP/TAZO <=4 SENSITIVE Sensitive     * >=100,000 COLONIES/mL PROTEUS PENNERI     [x]  Patient discharged originally without antimicrobial agent and treatment is now indicated  New antibiotic prescription: Bactrim DS 1 tablet twice daily for 14 days   ED Provider: Charlesetta Shanks, MD    Albertina Parr, PharmD., BCPS, BCCCP Clinical Pharmacist Please  refer to [facility] for unit-specific pharmacist

## 2020-05-26 ENCOUNTER — Other Ambulatory Visit: Payer: Self-pay

## 2020-05-26 ENCOUNTER — Ambulatory Visit (HOSPITAL_COMMUNITY)
Admission: EM | Admit: 2020-05-26 | Discharge: 2020-05-26 | Disposition: A | Discharge status: Home / Self Care | Payer: Medicare HMO | Attending: Family Medicine | Admitting: Family Medicine

## 2020-05-26 ENCOUNTER — Encounter (HOSPITAL_COMMUNITY): Payer: Self-pay | Admitting: Emergency Medicine

## 2020-05-26 DIAGNOSIS — M19029 Primary osteoarthritis, unspecified elbow: Secondary | ICD-10-CM

## 2020-05-26 MED ORDER — METHYLPREDNISOLONE SODIUM SUCC 125 MG IJ SOLR
80.0000 mg | Freq: Once | INTRAMUSCULAR | Status: AC
  Administered 2020-05-26: 14:00:00 80 mg via INTRAMUSCULAR

## 2020-05-26 MED ORDER — METHYLPREDNISOLONE SODIUM SUCC 125 MG IJ SOLR
INTRAMUSCULAR | Status: AC
  Filled 2020-05-26: qty 2

## 2020-05-26 NOTE — ED Triage Notes (Signed)
Patient c/o RT sided shoulder pain x 4 weeks.   Patient denies fall or trauma.   Patient endorses "fluid on my elbow".   Patient has decreased ROM upon assessment.   Patient has tried some "pain pills" with no relief of symptoms.

## 2020-05-26 NOTE — ED Provider Notes (Signed)
[place]    CSN: [id number] Arrival date & time: 05/26/20  1158      History   Chief Complaint Chief Complaint  Patient presents with  . Arm Pain    HPI Theodore King is a 74 y.o. male.   Established [facility] patient  Patient c/o RT sided shoulder pain x 4 weeks, without h/o trauma  Patient also notes "fluid on my elbow".   Patient has tried some "pain pills" with no relief of symptoms.  He indicates that his pain extends from just above the right wrist to just below the right shoulder.  He is able to raise his arm above his head and extend his elbow to almost straight, but he indicates that most of the pain is coming from the elbow.  Patient says he has had Parkinson's for the last year.     Past Medical History:  Diagnosis Date  . Abnormal dreams 09/06/2013  . Disorders of bursae and tendons in shoulder region, unspecified   . Hyperglycemia 05/18/2013  . Hyperlipidemia LDL goal < 100 05/18/2013  . Other and unspecified hyperlipidemia   . Other and unspecified hyperlipidemia   . 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 Paralysis agitans ([place]) 05/18/2013  . Parkinson's disease ([place])   . Pneumonia due to COVID-19 virus 04/08/2019  . 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 SOB (shortness of breath) 03/23/2018  . Unspecified vitamin D deficiency   . Unspecified vitamin D deficiency   . Vertigo, benign paroxysmal 11/05/2016  . Vitamin D deficiency 05/18/2013    Patient Active Problem List   Diagnosis Date Noted  . History of 2019 novel coronavirus disease (COVID-19) 04/24/2019  . Dysphonia 10/16/2018  . 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 Former smoker 08/15/2018  . SOB (shortness of breath) 03/23/2018  . Vertigo, benign paroxysmal 11/05/2016  . Abnormal dreams 09/06/2013  . 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 Hyperglycemia 05/18/2013  . Paralysis agitans ([place]) 05/18/2013  . Vitamin D deficiency  05/18/2013  . Hyperlipidemia LDL goal < 100 05/18/2013  . Parkinson's disease ([place]) 10/26/2012    Past Surgical History:  Procedure Laterality Date  . APPENDECTOMY    . ROTATOR CUFF REPAIR     right       Home Medications    Prior to Admission medications   Medication Sig Start Date End Date Taking? Authorizing Provider  albuterol (VENTOLIN HFA) 108 (90 Base) MCG/ACT inhaler Inhale 2 puffs into the lungs every 4 (four) hours as needed for wheezing or shortness of breath.  10/03/18   [provider]  carbidopa-levodopa (SINEMET CR) 50-200 MG tablet Take 1 tablet by mouth at bedtime. 04/02/20   Tat, Eustace Quail, DO  carbidopa-levodopa (SINEMET IR) 25-100 MG tablet Take 2 tablets at 7 AM, 2 tablets at 10 AM, 2 tablets at 2 PM and 1 tablet at 6 PM 11/21/19   Tat, Rebecca S, DO  cephALEXin (KEFLEX) 500 MG capsule Take 1 capsule (500 mg total) by mouth 2 (two) times daily. 04/16/20   Vanessa Kick, MD  entacapone (COMTAN) 200 MG tablet TAKE 1 TABLET THREE TIMES DAILY AT 7AM, 10AM AND 2PM. 10/29/19   Tat, Eustace Quail, DO  ibuprofen (ADVIL,MOTRIN) 400 MG tablet Take 400 mg by mouth every 6 (six) hours as needed.    [provider]  meclizine (ANTIVERT) 25 MG tablet Take 25 mg by mouth 2 (two) times daily as needed for dizziness.  10/02/18  [provider]  metFORMIN (GLUCOPHAGE) 500 MG tablet Take 500 mg by mouth daily with breakfast.     [provider]  Pimavanserin Tartrate (NUPLAZID) 34 MG CAPS Take 1 capsule (34 mg total) by mouth daily. 04/07/20   Tat, Eustace Quail, DO  tamsulosin (FLOMAX) 0.4 MG CAPS capsule Take 1 capsule (0.4 mg total) by mouth daily. 04/16/20   Vanessa Kick, MD  Tiotropium Bromide-Olodaterol (STIOLTO RESPIMAT) 2.5-2.5 MCG/ACT AERS Inhale 2 puffs into the lungs daily. 04/24/19   Margaretha Seeds, MD  vitamin C (VITAMIN C) 500 MG tablet Take 1 tablet (500 mg total) by mouth daily. 04/11/19   Allie Bossier, MD    Family History Family  History  Problem Relation Age of Onset  . Cancer Mother   . Cancer Father   . Stroke Brother   . Cancer Sister   . Healthy Daughter   . Cancer Sister   . Lung cancer Brother   . Healthy Daughter   . Colon cancer Neg Hx   . Esophageal cancer Neg Hx   . Rectal cancer Neg Hx   . Stomach cancer Neg Hx     Social History Social History   Tobacco Use  . Smoking status: Former Smoker    Packs/day: 0.50    Years: 20.00    Pack years: 10.00    Types: Cigarettes    Start date: 08/14/1953    Quit date: 05/23/1974    Years since quitting: 46.0  . Smokeless tobacco: Never Used  Vaping Use  . Vaping Use: Never used  Substance Use Topics  . Alcohol use: Not Currently  . Drug use: No     Allergies   Patient has no known allergies.   Review of Systems Review of Systems  Musculoskeletal: Positive for arthralgias.     Physical Exam Triage Vital Signs ED Triage Vitals  Enc Vitals Group     BP 05/26/20 1349 (!) 144/73     Pulse Rate 05/26/20 1349 63     Resp 05/26/20 1349 18     Temp 05/26/20 1349 (!) 97.5 F (36.4 C)     Temp Source 05/26/20 1349 Oral     SpO2 05/26/20 1349 100 %     Weight --      Height --      Head Circumference --      Peak Flow --      Pain Score 05/26/20 1347 10     Pain Loc --      Pain Edu? --      Excl. in [place]? --    No data found.  Updated Vital Signs BP (!) 144/73 (BP Location: Left Arm)   Pulse 63   Temp (!) 97.5 F (36.4 C) (Oral)   Resp 18   SpO2 100%    Physical Exam Vitals and nursing note reviewed.  Constitutional:      Appearance: Normal appearance. He is normal weight.  HENT:     Head: Normocephalic.  Eyes:     Conjunctiva/sclera: Conjunctivae normal.  Cardiovascular:     Rate and Rhythm: Normal rate.  Pulmonary:     Effort: Pulmonary effort is normal.  Musculoskeletal:        General: No swelling, tenderness, deformity or signs of injury.     Cervical back: Normal range of motion and neck supple.     Comments:  Patient can flex his elbow normally on the right but he is unable to fully extend it  lacking about 20 degrees.  He can raise his right arm over his head.  Skin:    General: Skin is warm and dry.  Neurological:     General: No focal deficit present.     Mental Status: He is alert.  Psychiatric:        Mood and Affect: Mood normal.      UC Treatments / Results  Labs (all labs ordered are listed, but only abnormal results are displayed) Labs Reviewed - No data to display  EKG   Radiology No results found.  Procedures Procedures (including critical care time)  Medications Ordered in UC Medications  methylPREDNISolone sodium succinate (SOLU-MEDROL) 125 mg/2 mL injection 80 mg (has no administration in time range)    Initial Impression / Assessment and Plan / UC Course  I have reviewed the triage vital signs and the nursing notes.  Pertinent labs & imaging results that were available during my care of the patient were reviewed by me and considered in my medical decision making (see chart for details).    Final Clinical Impressions(s) / UC Diagnoses   Final diagnoses:  Elbow arthritis   Discharge Instructions   None    ED Prescriptions    None     I have reviewed the PDMP during this encounter.   Robyn Haber, MD 05/26/20 1409

## 2020-06-04 ENCOUNTER — Other Ambulatory Visit: Payer: Self-pay

## 2020-06-04 ENCOUNTER — Telehealth: Payer: Self-pay | Admitting: Neurology

## 2020-06-04 ENCOUNTER — Encounter (HOSPITAL_COMMUNITY): Payer: Self-pay | Admitting: Emergency Medicine

## 2020-06-04 ENCOUNTER — Emergency Department (HOSPITAL_COMMUNITY)
Admission: EM | Admit: 2020-06-04 | Discharge: 2020-06-04 | Disposition: A | Discharge status: Home / Self Care | Payer: Medicare HMO | Attending: Emergency Medicine | Admitting: Emergency Medicine

## 2020-06-04 DIAGNOSIS — Z87891 Personal history of nicotine dependence: Secondary | ICD-10-CM | POA: Diagnosis not present

## 2020-06-04 DIAGNOSIS — G8929 Other chronic pain: Secondary | ICD-10-CM | POA: Insufficient documentation

## 2020-06-04 DIAGNOSIS — Z7984 Long term (current) use of oral hypoglycemic drugs: Secondary | ICD-10-CM | POA: Diagnosis not present

## 2020-06-04 DIAGNOSIS — G20 Parkinson's disease: Secondary | ICD-10-CM | POA: Insufficient documentation

## 2020-06-04 DIAGNOSIS — M25511 Pain in right shoulder: Secondary | ICD-10-CM | POA: Insufficient documentation

## 2020-06-04 DIAGNOSIS — Z79899 Other long term (current) drug therapy: Secondary | ICD-10-CM | POA: Insufficient documentation

## 2020-06-04 MED ORDER — PREDNISONE 20 MG PO TABS
60.0000 mg | ORAL_TABLET | Freq: Once | ORAL | Status: AC
  Administered 2020-06-04: 17:00:00 60 mg via ORAL
  Filled 2020-06-04: qty 3

## 2020-06-04 MED ORDER — TRAMADOL HCL 50 MG PO TABS: 50.0000 mg | ORAL_TABLET | Freq: Four times a day (QID) | ORAL | 0 refills | Status: DC | PRN

## 2020-06-04 MED ORDER — TRAMADOL HCL 50 MG PO TABS
50.0000 mg | ORAL_TABLET | Freq: Once | ORAL | Status: AC
  Administered 2020-06-04: 17:00:00 50 mg via ORAL
  Filled 2020-06-04: qty 1

## 2020-06-04 MED ORDER — PREDNISONE 10 MG PO TABS: 60.0000 mg | ORAL_TABLET | Freq: Every day | ORAL | 0 refills | Status: AC

## 2020-06-04 MED ORDER — ACETAMINOPHEN 325 MG PO TABS: 650.0000 mg | ORAL_TABLET | Freq: Four times a day (QID) | ORAL | 0 refills | Status: DC | PRN

## 2020-06-04 MED ORDER — IBUPROFEN 600 MG PO TABS: 600.0000 mg | ORAL_TABLET | Freq: Four times a day (QID) | ORAL | 0 refills | Status: AC | PRN

## 2020-06-04 MED ORDER — ACETAMINOPHEN 325 MG PO TABS
650.0000 mg | ORAL_TABLET | Freq: Once | ORAL | Status: AC
  Administered 2020-06-04: 17:00:00 650 mg via ORAL
  Filled 2020-06-04: qty 2

## 2020-06-04 MED ORDER — IBUPROFEN 400 MG PO TABS
600.0000 mg | ORAL_TABLET | Freq: Once | ORAL | Status: AC
  Administered 2020-06-04: 17:00:00 600 mg via ORAL
  Filled 2020-06-04: qty 1

## 2020-06-04 NOTE — Telephone Encounter (Signed)
Patient's daughter called in stating her dad's arm has been hurting and he cannot lift it up. She has taken him to the urgent care and the ED on 05/26/20. She said she is taking him to the Urgent care now because he says his whole right side is hurting and his feet are swollen. She states she wants Dr. Arbutus Leas to be aware of this to see if this has anything to do with his Parkinson's?

## 2020-06-04 NOTE — ED Notes (Signed)
Patient verbalizes understanding of discharge instructions. Prescriptions reviewed. Follow-up care reviewed. Opportunity for questioning and answers were provided. Armband removed by staff, pt discharged from ED via wheelchair to lobby to wait on ride.

## 2020-06-04 NOTE — Discharge Instructions (Addendum)
You are having pain in your right shoulder and arm.  I suspect this is due to inflammation around your shoulders, which may be due to frozen shoulder syndrome, or another cause.  I prescribed some anti-inflammatory medicine.  Because you have been having this pain for a long time, I would recommend that you see an orthopedic doctor at this point.  I gave you a phone number and would recommend you call for an appointment.  Unfortunately, aside from medications, there is little else that can be done for this condition in the emergency department or urgent care.  In terms of your pain management, I prescribed the following medications.  You can take Tylenol 650 mg every 6 hours at home.  You can take Motrin (ie. Ibuprofen, or advil) 600 mg every 8 hours at home.  (Always take motrin with food, and you should not be taking more than the 30 pills prescribed.  Long term motrin use can cause stomach ulcers and kidney problems.  Do NOT take aspirin or other NSAIDS while taking motrin.)  You can increase your tramadol dose to 50 mg every 6-8 hours as needed for pain.  I prescribed you a few extra tablets, but we cannot prescribe long-term narcotics from the emergency department.  Finally, I started you on another short course of prednisone, which is a steroid.  You will take another 4 days of this medication, beginning tomorrow morning.

## 2020-06-04 NOTE — ED Triage Notes (Signed)
Pt arrives to ED with c/o of right sided arm pain x1 week. Pt states his right arm hurts from his wrist to elbow. Pt able to move arm above head and squeeze hand appropriately. Pt states the pain get severe at night and doesn't allow him to sleep.

## 2020-06-04 NOTE — ED Provider Notes (Signed)
[facility] Provider Note   CSN: [id number] Arrival date & time: 06/04/20  1330     History Chief Complaint  Patient presents with  . Arm Pain    Theodore BouillonWalter H Visconti is a 74 y.o. male w/ hx of tendonitis and bursitis of the shoulder presenting to the ED with right shoulder and right arm pain.  This is been ongoing for few months, but he feels that the pain is worsening, and says has not been able to sleep for the past several days.  He has been seen in urgent cares in the emergency department several times over the past 2 months for similar complaints.  He reports he was given steroids at the start of the month which did help for a few days, but the pain then returned.  He says he has not seen an orthopedic doctor.  He describes significant pain that begins around his right shoulder radiates down through his right elbow into his right hand.  The pain is worse with overhead reaching, when he says he reaches up for a cabinet, it gets a lot worse.  The pain is constant.  It is 10 out of 10 in intensity.  He has been taking few over-the-counter Motrin, as well as tramadol once a day prescribed by his PCP, but not getting enough relief from this.  HPI     Past Medical History:  Diagnosis Date  . Abnormal dreams 09/06/2013  . Disorders of bursae and tendons in shoulder region, unspecified   . Hyperglycemia 05/18/2013  . Hyperlipidemia LDL goal < 100 05/18/2013  . Other and unspecified hyperlipidemia   . Other and unspecified hyperlipidemia   . 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 Paralysis agitans ([facility]) 05/18/2013  . Parkinson's disease ([facility])   . Pneumonia due to COVID-19 virus 04/08/2019  . 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 SOB (shortness of breath) 03/23/2018  . Unspecified vitamin D deficiency   . Unspecified vitamin D deficiency   . Vertigo, benign paroxysmal 11/05/2016  . Vitamin D deficiency 05/18/2013    Patient Active  Problem List   Diagnosis Date Noted  . History of 2019 novel coronavirus disease (COVID-19) 04/24/2019  . Dysphonia 10/16/2018  . 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 Former smoker 08/15/2018  . SOB (shortness of breath) 03/23/2018  . Vertigo, benign paroxysmal 11/05/2016  . Abnormal dreams 09/06/2013  . 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 Hyperglycemia 05/18/2013  . Paralysis agitans ([facility]) 05/18/2013  . Vitamin D deficiency 05/18/2013  . Hyperlipidemia LDL goal < 100 05/18/2013  . Parkinson's disease ([facility]) 10/26/2012    Past Surgical History:  Procedure Laterality Date  . APPENDECTOMY    . ROTATOR CUFF REPAIR     right       Family History  Problem Relation Age of Onset  . Cancer Mother   . Cancer Father   . Stroke Brother   . Cancer Sister   . Healthy Daughter   . Cancer Sister   . Lung cancer Brother   . Healthy Daughter   . Colon cancer Neg Hx   . Esophageal cancer Neg Hx   . Rectal cancer Neg Hx   . Stomach cancer Neg Hx     Social History   Tobacco Use  . Smoking status: Former Smoker    Packs/day: 0.50    Years: 20.00    Pack years: 10.00    Types: Cigarettes  Start date: 08/14/1953    Quit date: 05/23/1974    Years since quitting: 46.0  . Smokeless tobacco: Never Used  Vaping Use  . Vaping Use: Never used  Substance Use Topics  . Alcohol use: Not Currently  . Drug use: No    Home Medications Prior to Admission medications   Medication Sig Start Date End Date Taking? Authorizing Provider  acetaminophen (TYLENOL) 325 MG tablet Take 2 tablets (650 mg total) by mouth every 6 (six) hours as needed for up to 30 doses for mild pain or moderate pain. 06/04/20  Yes Terald Sleeper, MD  ibuprofen (ADVIL) 600 MG tablet Take 1 tablet (600 mg total) by mouth every 6 (six) hours as needed for moderate pain. 06/04/20 07/04/20 Yes Arnitra Sokoloski, Kermit Balo, MD  predniSONE (DELTASONE) 10 MG tablet Take 6 tablets (60 mg total) by  mouth daily for 4 days. 06/05/20 06/09/20 Yes Nikita Humble, Kermit Balo, MD  traMADol (ULTRAM) 50 MG tablet Take 1 tablet (50 mg total) by mouth every 6 (six) hours as needed for up to 12 doses for severe pain. 06/04/20  Yes Terald Sleeper, MD  albuterol (VENTOLIN HFA) 108 (90 Base) MCG/ACT inhaler Inhale 2 puffs into the lungs every 4 (four) hours as needed for wheezing or shortness of breath.  10/03/18   [provider]  carbidopa-levodopa (SINEMET CR) 50-200 MG tablet Take 1 tablet by mouth at bedtime. 04/02/20   Tat, Octaviano Batty, DO  carbidopa-levodopa (SINEMET IR) 25-100 MG tablet Take 2 tablets at 7 AM, 2 tablets at 10 AM, 2 tablets at 2 PM and 1 tablet at 6 PM 11/21/19   Tat, Rebecca S, DO  cephALEXin (KEFLEX) 500 MG capsule Take 1 capsule (500 mg total) by mouth 2 (two) times daily. 04/16/20   Mardella Layman, MD  entacapone (COMTAN) 200 MG tablet TAKE 1 TABLET THREE TIMES DAILY AT 7AM, 10AM AND 2PM. 10/29/19   Tat, Octaviano Batty, DO  ibuprofen (ADVIL,MOTRIN) 400 MG tablet Take 400 mg by mouth every 6 (six) hours as needed.    [provider]  meclizine (ANTIVERT) 25 MG tablet Take 25 mg by mouth 2 (two) times daily as needed for dizziness.  10/02/18   [provider]  metFORMIN (GLUCOPHAGE) 500 MG tablet Take 500 mg by mouth daily with breakfast.     [provider]  Pimavanserin Tartrate (NUPLAZID) 34 MG CAPS Take 1 capsule (34 mg total) by mouth daily. 04/07/20   Tat, Octaviano Batty, DO  tamsulosin (FLOMAX) 0.4 MG CAPS capsule Take 1 capsule (0.4 mg total) by mouth daily. 04/16/20   Mardella Layman, MD  Tiotropium Bromide-Olodaterol (STIOLTO RESPIMAT) 2.5-2.5 MCG/ACT AERS Inhale 2 puffs into the lungs daily. 04/24/19   Luciano Cutter, MD  vitamin C (VITAMIN C) 500 MG tablet Take 1 tablet (500 mg total) by mouth daily. 04/11/19   Drema Dallas, MD    Allergies    Patient has no known allergies.  Review of Systems   Review of Systems  Constitutional: Negative for chills and  fever.  Respiratory: Negative for cough and shortness of breath.   Cardiovascular: Negative for chest pain and palpitations.  Genitourinary: Negative for dysuria and hematuria.  Musculoskeletal: Positive for arthralgias and myalgias.  Skin: Negative for rash and wound.  Neurological: Negative for weakness and numbness.  Psychiatric/Behavioral: Negative for agitation and confusion.  All other systems reviewed and are negative.   Physical Exam Updated Vital Signs BP (!) 145/85 (BP Location: Left Arm)  Pulse 72   Temp 98.4 F (36.9 C) (Oral)   Resp 18   Ht 6' (1.829 m)   Wt 92 kg   SpO2 98%   BMI 27.51 kg/m   Physical Exam Vitals and nursing note reviewed.  Constitutional:      Appearance: He is well-developed and well-nourished.  HENT:     Head: Normocephalic and atraumatic.  Eyes:     Conjunctiva/sclera: Conjunctivae normal.  Cardiovascular:     Rate and Rhythm: Normal rate and regular rhythm.     Pulses: Normal pulses.  Pulmonary:     Effort: Pulmonary effort is normal. No respiratory distress.  Musculoskeletal:        General: No edema.     Cervical back: Neck supple.  Skin:    General: Skin is warm and dry.  Neurological:     General: No focal deficit present.     Mental Status: He is alert and oriented to person, place, and time.     Sensory: No sensory deficit.     Motor: No weakness.     Comments: Negative spurling test Shoulder and arm pain reproducible with overhead arm raise Hand grip and wrist strength intact Finger adduction and abduction intact  Psychiatric:        Mood and Affect: Mood and affect normal.     ED Results / Procedures / Treatments   Labs (all labs ordered are listed, but only abnormal results are displayed) Labs Reviewed - No data to display  EKG None  Radiology No results found.  Procedures Procedures (including critical care time)  Medications Ordered in ED Medications  predniSONE (DELTASONE) tablet 60 mg (60 mg Oral  Given 06/04/20 1656)  ibuprofen (ADVIL) tablet 600 mg (600 mg Oral Given 06/04/20 1656)  acetaminophen (TYLENOL) tablet 650 mg (650 mg Oral Given 06/04/20 1656)  traMADol (ULTRAM) tablet 50 mg (50 mg Oral Given 06/04/20 1656)    ED Course  I have reviewed the triage vital signs and the nursing notes.  Pertinent labs & imaging results that were available during my care of the patient were reviewed by me and considered in my medical decision making (see chart for details).  This is a 75-year male presented emerge department with right arm and shoulder pain ongoing for several months.  Is reproducible with overhead arm raise.  I suspect it may be an adhesive capsulitis or frozen shoulder syndrome, with possible nerve impingement.  He has no objective weakness or neurological deficits on exam.  I cannot elicit the pain with manipulation of the neck, therefore have a lower suspicion this is a cervical root impingement.  We can try him on Tylenol, Motrin, prednisone, increase his tramadol for a few days.  I will refer him to orthopedics.     Final Clinical Impression(s) / ED Diagnoses Final diagnoses:  Chronic right shoulder pain    Rx / DC Orders ED Discharge Orders         Ordered    predniSONE (DELTASONE) 10 MG tablet  Daily        06/04/20 1648    acetaminophen (TYLENOL) 325 MG tablet  Every 6 hours PRN        06/04/20 1648    ibuprofen (ADVIL) 600 MG tablet  Every 6 hours PRN        06/04/20 1648    traMADol (ULTRAM) 50 MG tablet  Every 6 hours PRN        06/04/20 1648  Wyvonnia Dusky, MD 06/04/20 Karl Bales

## 2020-06-05 NOTE — Telephone Encounter (Signed)
Left message for patient to contact office.

## 2020-06-05 NOTE — Telephone Encounter (Signed)
Daughter advised.

## 2020-06-05 NOTE — Telephone Encounter (Signed)
Per message text from heather, pt was on way yesterday to UC when they called.  Please find out the result of that.  This would not have anything to do with Parkinsons Disease, however and IF already was worked up by ED or UC, needs to f/u with PCP today

## 2020-06-05 NOTE — Telephone Encounter (Signed)
Swelling and arm pain are not from Parkinsons Disease.  Make sure that its not L arm pain (I.e. no symptoms of chest pain that could be cardiac, as that can cause L arm pain).  If so, would need urgent follow up with ER or UC or PCP.  While his Parkinsons Disease symptoms may fluctuate, these symptoms are not from Parkinsons Disease.

## 2020-06-05 NOTE — Telephone Encounter (Signed)
Daughter states the patient is having arm pain and she is frustrated.   She states she contacted the patients pcp and he can not be seen until next week.   She states the parkinson's medication is not working. She states the patients feet are swelling and she read something online that states parkinson's can cause feet swelling. She states she wants to have the patients parkinson's re-evaluated. She states she will accompany the patient to his next appointment. She states she did not come to the last patient to his last appointment because his girlfriend came with him. She states she is so frustrated because her dad's parkinson's is up and down. She states she does not know what to do and does not want to see him suffer. She states she is seeking some guidance in the right direction. And wants to know what does Dr Tat suggest she do for her dad.

## 2020-06-17 ENCOUNTER — Telehealth: Payer: Self-pay | Admitting: Neurology

## 2020-06-17 NOTE — Telephone Encounter (Signed)
Message again from [facility] that they were unable to send pt his Nuplazid as they were unable to get ahold of pt/family

## 2020-07-01 ENCOUNTER — Telehealth: Payer: Self-pay | Admitting: Neurology

## 2020-07-01 NOTE — Telephone Encounter (Signed)
Is a refill ok?

## 2020-07-01 NOTE — Telephone Encounter (Signed)
Patient's daughter, [name], called to request the patient's Nuplazid (? Dosage) sent to [facility] on [address]. He has new insurance this year.

## 2020-07-02 ENCOUNTER — Telehealth: Payer: Self-pay

## 2020-07-02 MED ORDER — NUPLAZID 34 MG PO CAPS: 34.0000 mg | ORAL_CAPSULE | Freq: Every day | ORAL | 3 refills | Status: DC

## 2020-07-02 NOTE — Telephone Encounter (Signed)
Informed patients daughter that we have some samples that we can place upfront for pick up.   Daughter voiced understanding and states she will get samples today or tomorrow. Advised her that the samples would be placed upfront for pick up.    4 boxes of samples of Nuplazid 1 box contains 7 tabs  lot: [id number]  Exp: 07/2020

## 2020-07-02 NOTE — Telephone Encounter (Signed)
You can try to send there but we have gotten multiple notifications that they have tried to send them the nuplazid but the family has not responded to notifications/phone calls.  I think that nuplazid may have to come from a central pharmacy.

## 2020-07-02 NOTE — Telephone Encounter (Signed)
Spoke with patients daughter and explained to her that the medication is so expensive it has to be sent to a specialty pharmacy. She states her dad has changed insurances and [facility] suggested they use [facility].   Advised her that I would send the rx to [facility], but it may kick it back(meaning can not be filled or will be very expensive). I asked her if [facility] had a mail order pharmacy and she states she was told that they do not. I asked her if she ever reached out to [facility] and she states she did but because her dad's insurance changed he could not get the mediation. Advised her again that we would send the rx to [facility] and if it is unable to be filled that we will send the rx to a specialty pharmacy. She voiced understanding.  Patients daughter thanked me for all my help.

## 2020-07-08 ENCOUNTER — Telehealth: Payer: Self-pay | Admitting: Neurology

## 2020-07-08 MED ORDER — CARBIDOPA-LEVODOPA 25-100 MG PO TABS: ORAL_TABLET | ORAL | 2 refills | Status: DC

## 2020-07-08 NOTE — Telephone Encounter (Signed)
Patient's daughter called in stating the patient's carbidopa-levodopa IR bottle has gone missing. She is wondering if she could get some samples for him to take until they can locate the bottle or get a refill?

## 2020-07-08 NOTE — Telephone Encounter (Signed)
There are no samples of this medication.  Will need to refill at pharmacy.  They may need to pay out of pocket for this because of insurance issues.  I'm okay with RF, Darden Dates

## 2020-07-08 NOTE — Telephone Encounter (Signed)
Spoke with patients daughter who states hr dad needs a refill on Carbidopa Levodopa sent to [facility].   Rx(s) sent to pharmacy electronically.  Patients daughter voiced understanding.

## 2020-08-08 ENCOUNTER — Telehealth: Payer: Self-pay

## 2020-08-08 NOTE — Telephone Encounter (Signed)
Received a call from [facility] and they stated they have been trying to reach the patient to send him his Nuplziad. They were calling to make our office aware.

## 2020-08-21 ENCOUNTER — Encounter (HOSPITAL_COMMUNITY): Payer: Self-pay | Admitting: Emergency Medicine

## 2020-08-21 ENCOUNTER — Other Ambulatory Visit: Payer: Self-pay

## 2020-08-21 ENCOUNTER — Emergency Department (HOSPITAL_COMMUNITY)
Admission: EM | Admit: 2020-08-21 | Discharge: 2020-08-22 | Disposition: A | Discharge status: Home / Self Care | Payer: Medicare HMO | Attending: Emergency Medicine | Admitting: Emergency Medicine

## 2020-08-21 DIAGNOSIS — N3000 Acute cystitis without hematuria: Secondary | ICD-10-CM | POA: Diagnosis not present

## 2020-08-21 DIAGNOSIS — Y9301 Activity, walking, marching and hiking: Secondary | ICD-10-CM | POA: Diagnosis not present

## 2020-08-21 DIAGNOSIS — R531 Weakness: Secondary | ICD-10-CM | POA: Diagnosis not present

## 2020-08-21 DIAGNOSIS — X58XXXA Exposure to other specified factors, initial encounter: Secondary | ICD-10-CM | POA: Diagnosis not present

## 2020-08-21 DIAGNOSIS — Z8616 Personal history of COVID-19: Secondary | ICD-10-CM | POA: Insufficient documentation

## 2020-08-21 DIAGNOSIS — R079 Chest pain, unspecified: Secondary | ICD-10-CM | POA: Insufficient documentation

## 2020-08-21 DIAGNOSIS — Z79899 Other long term (current) drug therapy: Secondary | ICD-10-CM | POA: Insufficient documentation

## 2020-08-21 DIAGNOSIS — Z87891 Personal history of nicotine dependence: Secondary | ICD-10-CM | POA: Insufficient documentation

## 2020-08-21 DIAGNOSIS — Y92003 Bedroom of unspecified non-institutional (private) residence as the place of occurrence of the external cause: Secondary | ICD-10-CM | POA: Diagnosis not present

## 2020-08-21 DIAGNOSIS — R600 Localized edema: Secondary | ICD-10-CM | POA: Diagnosis not present

## 2020-08-21 DIAGNOSIS — S50851A Superficial foreign body of right forearm, initial encounter: Secondary | ICD-10-CM | POA: Insufficient documentation

## 2020-08-21 DIAGNOSIS — G20 Parkinson's disease: Secondary | ICD-10-CM | POA: Insufficient documentation

## 2020-08-21 MED ORDER — ACETAMINOPHEN 500 MG PO TABS
1000.0000 mg | ORAL_TABLET | Freq: Once | ORAL | Status: AC
  Administered 2020-08-22: 1000 mg via ORAL
  Filled 2020-08-21: qty 2

## 2020-08-21 MED ORDER — CARBIDOPA-LEVODOPA 25-100 MG PO TABS
1.0000 | ORAL_TABLET | Freq: Three times a day (TID) | ORAL | Status: DC
  Administered 2020-08-22: 1 via ORAL
  Filled 2020-08-21 (×4): qty 1

## 2020-08-21 NOTE — ED Triage Notes (Signed)
Patient states he was walking in his bedroom today when his legs "locked up" and he was unable to move his legs for 20-30 minutes. Patient did not fall and leg function returned to normal. Patient alert, oriented, and in no apparent distress at this time.

## 2020-08-22 ENCOUNTER — Emergency Department (HOSPITAL_COMMUNITY): Payer: Medicare HMO

## 2020-08-22 DIAGNOSIS — S50851A Superficial foreign body of right forearm, initial encounter: Secondary | ICD-10-CM | POA: Diagnosis not present

## 2020-08-22 LAB — COMPREHENSIVE METABOLIC PANEL
ALT: 10 U/L (ref 0–44)
AST: 14 U/L — ABNORMAL LOW (ref 15–41)
Albumin: 3.9 g/dL (ref 3.5–5.0)
Alkaline Phosphatase: 57 U/L (ref 38–126)
Anion gap: 6 (ref 5–15)
BUN: 17 mg/dL (ref 8–23)
CO2: 29 mmol/L (ref 22–32)
Calcium: 9.4 mg/dL (ref 8.9–10.3)
Chloride: 103 mmol/L (ref 98–111)
Creatinine, Ser: 1 mg/dL (ref 0.61–1.24)
GFR, Estimated: >60 mL/min (ref >60)
Glucose, Bld: 99 mg/dL (ref 70–99)
Potassium: 3.7 mmol/L (ref 3.5–5.1)
Sodium: 138 mmol/L (ref 135–145)
Total Bilirubin: 2.2 mg/dL — ABNORMAL HIGH (ref 0.3–1.2)
Total Protein: 6.9 g/dL (ref 6.5–8.1)

## 2020-08-22 LAB — CBC WITH DIFFERENTIAL/PLATELET
Abs Immature Granulocytes: 0.01 10*3/uL (ref 0.00–0.07)
Basophils Absolute: 0 10*3/uL (ref 0.0–0.1)
Basophils Relative: 0 %
Eosinophils Absolute: 0 10*3/uL (ref 0.0–0.5)
Eosinophils Relative: 0 %
HCT: 44.4 % (ref 39.0–52.0)
Hemoglobin: 14.3 g/dL (ref 13.0–17.0)
Immature Granulocytes: 0 %
Lymphocytes Relative: 31 %
Lymphs Abs: 1.8 10*3/uL (ref 0.7–4.0)
MCH: 27.6 pg (ref 26.0–34.0)
MCHC: 32.2 g/dL (ref 30.0–36.0)
MCV: 85.5 fL (ref 80.0–100.0)
Monocytes Absolute: 0.4 10*3/uL (ref 0.1–1.0)
Monocytes Relative: 6 %
Neutro Abs: 3.6 10*3/uL (ref 1.7–7.7)
Neutrophils Relative %: 63 %
Platelets: 157 10*3/uL (ref 150–400)
RBC: 5.19 MIL/uL (ref 4.22–5.81)
RDW: 13.2 % (ref 11.5–15.5)
WBC: 5.8 10*3/uL (ref 4.0–10.5)
nRBC: 0 % (ref 0.0–0.2)

## 2020-08-22 LAB — URINALYSIS, ROUTINE W REFLEX MICROSCOPIC
Bilirubin Urine: NEGATIVE
Glucose, UA: NEGATIVE mg/dL
Hgb urine dipstick: NEGATIVE
Ketones, ur: 5 mg/dL — AB
Nitrite: NEGATIVE
Protein, ur: NEGATIVE mg/dL
Specific Gravity, Urine: 1.024 (ref 1.005–1.030)
pH: 5 (ref 5.0–8.0)

## 2020-08-22 LAB — VITAMIN D 25 HYDROXY (VIT D DEFICIENCY, FRACTURES): Vit D, 25-Hydroxy: 16.17 ng/mL — ABNORMAL LOW (ref 30–100)

## 2020-08-22 LAB — VITAMIN B12: Vitamin B-12: 348 pg/mL (ref 180–914)

## 2020-08-22 LAB — BRAIN NATRIURETIC PEPTIDE: B Natriuretic Peptide: 13.2 pg/mL (ref 0.0–100.0)

## 2020-08-22 MED ORDER — CEPHALEXIN 250 MG PO CAPS
1000.0000 mg | ORAL_CAPSULE | Freq: Once | ORAL | Status: AC
  Administered 2020-08-22: 1000 mg via ORAL
  Filled 2020-08-22: qty 4

## 2020-08-22 MED ORDER — CEPHALEXIN 500 MG PO CAPS: 1000.0000 mg | ORAL_CAPSULE | Freq: Two times a day (BID) | ORAL | 0 refills | Status: DC

## 2020-08-22 MED ORDER — CARBIDOPA-LEVODOPA ER 50-200 MG PO TBCR: 1.0000 | EXTENDED_RELEASE_TABLET | Freq: Every day | ORAL | Status: DC

## 2020-08-22 MED ORDER — IOHEXOL 350 MG/ML SOLN
100.0000 mL | Freq: Once | INTRAVENOUS | Status: AC | PRN
  Administered 2020-08-22: 100 mL via INTRAVENOUS

## 2020-08-22 MED ORDER — CARBIDOPA-LEVODOPA 25-100 MG PO TABS: 1.0000 | ORAL_TABLET | Freq: Every day | ORAL | Status: DC

## 2020-08-22 NOTE — ED Notes (Signed)
Pt to CT via stretcher

## 2020-08-22 NOTE — ED Notes (Signed)
Daughter/caregiver Medardo Hassing [phone] wants an update as soon as possible

## 2020-08-22 NOTE — ED Provider Notes (Signed)
[facility] Provider Note   CSN: [id number] Arrival date & time: 08/21/20  1550     History Chief Complaint  Patient presents with  . Extremity Weakness    Theodore King is a 75 y.o. male.  75 year old male with multimedical problems document below to include Parkinson's a presents the emergency department today with leg abnormality.  Patient states that he had difficulty moving his legs earlier today.  Stating most improved now.  States it was not really any severe pain associated with it.  Did not state that there was any one-sided was worse than other.  No trauma.  No falls.  No back pain.  No urinary symptoms.  No other complaints at this time.  Further history from the daughter states that the patient has Parkinson's and often forgets to take his medication or just refuses to.  When that happens he starts having symptoms similar to this.  She doubts there is any new issues going on.  She does want evaluated the as he demanded it.   Extremity Weakness       Past Medical History:  Diagnosis Date  . Abnormal dreams 09/06/2013  . Disorders of bursae and tendons in shoulder region, unspecified   . Hyperglycemia 05/18/2013  . Hyperlipidemia LDL goal < 100 05/18/2013  . Other and unspecified hyperlipidemia   . Other and unspecified hyperlipidemia   . 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 Paralysis agitans ([place]) 05/18/2013  . Parkinson's disease ([place])   . Pneumonia due to COVID-19 virus 04/08/2019  . 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 SOB (shortness of breath) 03/23/2018  . Unspecified vitamin D deficiency   . Unspecified vitamin D deficiency   . Vertigo, benign paroxysmal 11/05/2016  . Vitamin D deficiency 05/18/2013    Patient Active Problem List   Diagnosis Date Noted  . History of 2019 novel coronavirus disease (COVID-19) 04/24/2019  . Dysphonia 10/16/2018  . 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 Former smoker 08/15/2018  . SOB (shortness of breath) 03/23/2018  . Vertigo, benign paroxysmal 11/05/2016  . Abnormal dreams 09/06/2013  . 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 Hyperglycemia 05/18/2013  . Paralysis agitans ([place]) 05/18/2013  . Vitamin D deficiency 05/18/2013  . Hyperlipidemia LDL goal < 100 05/18/2013  . Parkinson's disease ([place]) 10/26/2012    Past Surgical History:  Procedure Laterality Date  . APPENDECTOMY    . ROTATOR CUFF REPAIR     right       Family History  Problem Relation Age of Onset  . Cancer Mother   . Cancer Father   . Stroke Brother   . Cancer Sister   . Healthy Daughter   . Cancer Sister   . Lung cancer Brother   . Healthy Daughter   . Colon cancer Neg Hx   . Esophageal cancer Neg Hx   . Rectal cancer Neg Hx   . Stomach cancer Neg Hx     Social History   Tobacco Use  . Smoking status: Former Smoker    Packs/day: 0.50    Years: 20.00    Pack years: 10.00    Types: Cigarettes    Start date: 08/14/1953    Quit date: 05/23/1974    Years since quitting: 46.2  . Smokeless tobacco: Never Used  Vaping Use  . Vaping Use: Never used  Substance Use Topics  . Alcohol use: Not Currently  . Drug use: No  Home Medications Prior to Admission medications   Medication Sig Start Date End Date Taking? Authorizing Provider  acetaminophen (TYLENOL) 325 MG tablet Take 2 tablets (650 mg total) by mouth every 6 (six) hours as needed for up to 30 doses for mild pain or moderate pain. 06/04/20   Wyvonnia Dusky, MD  albuterol (VENTOLIN HFA) 108 (90 Base) MCG/ACT inhaler Inhale 2 puffs into the lungs every 4 (four) hours as needed for wheezing or shortness of breath.  10/03/18   [provider]  carbidopa-levodopa (SINEMET CR) 50-200 MG tablet Take 1 tablet by mouth at bedtime. 04/02/20   Tat, Eustace Quail, DO  carbidopa-levodopa (SINEMET IR) 25-100 MG tablet Take 2 tablets at 7 AM, 2 tablets at 10 AM, 2 tablets at 2 PM  and 1 tablet at 6 PM 07/08/20   Tat, Rebecca S, DO  cephALEXin (KEFLEX) 500 MG capsule Take 1 capsule (500 mg total) by mouth 2 (two) times daily. 04/16/20   Vanessa Kick, MD  entacapone (COMTAN) 200 MG tablet TAKE 1 TABLET THREE TIMES DAILY AT 7AM, 10AM AND 2PM. 10/29/19   Tat, Eustace Quail, DO  ibuprofen (ADVIL,MOTRIN) 400 MG tablet Take 400 mg by mouth every 6 (six) hours as needed.    [provider]  meclizine (ANTIVERT) 25 MG tablet Take 25 mg by mouth 2 (two) times daily as needed for dizziness.  10/02/18   [provider]  metFORMIN (GLUCOPHAGE) 500 MG tablet Take 500 mg by mouth daily with breakfast.     [provider]  Pimavanserin Tartrate (NUPLAZID) 34 MG CAPS Take 1 capsule (34 mg total) by mouth daily. 07/02/20   Tat, Eustace Quail, DO  tamsulosin (FLOMAX) 0.4 MG CAPS capsule Take 1 capsule (0.4 mg total) by mouth daily. 04/16/20   Vanessa Kick, MD  Tiotropium Bromide-Olodaterol (STIOLTO RESPIMAT) 2.5-2.5 MCG/ACT AERS Inhale 2 puffs into the lungs daily. 04/24/19   Margaretha Seeds, MD  traMADol (ULTRAM) 50 MG tablet Take 1 tablet (50 mg total) by mouth every 6 (six) hours as needed for up to 12 doses for severe pain. 06/04/20   Wyvonnia Dusky, MD  vitamin C (VITAMIN C) 500 MG tablet Take 1 tablet (500 mg total) by mouth daily. 04/11/19   Allie Bossier, MD    Allergies    Patient has no known allergies.  Review of Systems   Review of Systems  Cardiovascular: Positive for leg swelling (bilateral, R>L).  Musculoskeletal: Positive for extremity weakness.  All other systems reviewed and are negative.   Physical Exam Updated Vital Signs BP (!) 165/76   Pulse 70   Temp 98.3 F (36.8 C) (Oral)   Resp 17   SpO2 100%   Physical Exam  ED Results / Procedures / Treatments   Labs (all labs ordered are listed, but only abnormal results are displayed) Labs Reviewed  CBC WITH DIFFERENTIAL/PLATELET  COMPREHENSIVE METABOLIC PANEL  URINALYSIS, ROUTINE W  REFLEX MICROSCOPIC  BRAIN NATRIURETIC PEPTIDE    EKG None  Radiology No results found.  Procedures Procedures   Medications Ordered in ED Medications  carbidopa-levodopa (SINEMET IR) 25-100 MG per tablet immediate release 1 tablet (1 tablet Oral Given 08/22/20 0116)  acetaminophen (TYLENOL) tablet 1,000 mg (1,000 mg Oral Given 08/22/20 0116)    ED Course  I have reviewed the triage vital signs and the nursing notes.  Pertinent labs & imaging results that were available during my care of the patient were reviewed by me and considered in my  medical decision making (see chart for details).    MDM Rules/Calculators/A&P                          Patient seems to be improved now.  This could very well be related to his Parkinson's.  Bladder scan was less than 250 even an hour or so after he urinated last and no midline back pain so low suspicion for cauda equina.  Will give him his medication check basic labs but she is not severely dehydrated, anemic or any other acute issues requiring emergent treatment.  Likely be able to discharge.  Patient symptom improved after his Sinemet.  We will give his morning dose as well.  Especially after discussion with his daughter I felt this is probably an acute on chronic issue.  We will still await his CT scans but I do not think he needs a large work-up or admission to the hospitalist time.  Care transferred pending CT read.  Final Clinical Impression(s) / ED Diagnoses Final diagnoses:  Generalized weakness  Parkinson disease ([place])  Acute cystitis without hematuria  Foreign body in right forearm, initial encounter    Rx / DC Orders ED Discharge Orders    None       Mesner, Corene Cornea, MD 08/23/20 2326

## 2020-08-22 NOTE — ED Notes (Signed)
Attempted to have pt stand from sitting on the bed as per MD order, but pt was unable to stand up. MD was at bedside to witness this.

## 2020-08-22 NOTE — Discharge Instructions (Addendum)
Private Pay Resources  [facility] Address: [place], [place], [place] [address] Phone: [phone]  [facility] Address: [address], [place], [place] [address] Phone: [phone]  [facility] Address: [address] [place], [place] [address] Phone: [phone]  [facility] Address: [address] [place], [place] [address] Phone: [phone]  [facility] Address: [address] [place], [place],  [address] Phone: [phone]  [email or web address]  [facility] [phone]  You have been given an antibiotic called Keflex for urinary tract infection.  Take this twice daily as prescribed.  You have a small piece of metal just under the skin in your right forearm.  There is no sign of infection or other immediate problems.  Stable pieces of material under the skin do not have to be removed, however if this is causing you persistent problems with pain, follow-up with [facility] surgery for evaluation for possible removal.  Continue to work with your primary doctor and neurologist regarding management of Parkinson's disease and additional home care.

## 2020-08-22 NOTE — ED Notes (Signed)
Patient transported to CT 

## 2020-08-22 NOTE — ED Provider Notes (Signed)
F/u CT. Case management to assist additional Home Health help. Physical Exam  BP (!) 210/94   Pulse 76   Temp 97.8 F (36.6 C) (Oral)   Resp 19   SpO2 100%   Physical Exam  ED Course/Procedures     Procedures  MDM  1.  CT scan does not show any acute findings.  2. X-ray of the forearm shows a small subcutaneous foreign body which appears metallic.  I have examined the patient and find that this is palpable but Neath the surface of the skin.  Overlying skin is normal in appearance.  There are no wounds, no swelling, no erythema.  This appears to be a well-established foreign body without complication.  His daughter reports that for 3 months since he got an injection in that area, he has complained of pain that shoots up and down his arm.  Actively there are no soft tissue or neurovascular problems.  I advised his daughter to follow-up with general surgery for potential removal of the foreign body if it is causing persistent problems and pain.  3.  Social work has provided resources to the patient's daughter for finding additional in-home assistance for managing with Parkinson's and declining independent function and gait stability.  4.  Patient is having frequent urination.  I examined his abdomen which is soft.  Bladder scan done earlier showed 140 mL.  Patient has been making urine.  I did a quick ultrasound of the bladder and there is no large volume retention despite patient having urgency to urinate.  Symptoms may very well be due to UTI.  Patient is being started on Keflex.  Patient is alert with stable vital signs.  No signs of being toxic or septic.  Stable for continued home management.  5.  I had recalled the patient's daughter and reviewed all the findings and plan.  She is agreeable for proceeding with plans as reviewed       Charlesetta Shanks, MD 08/22/20 1049

## 2020-08-22 NOTE — Progress Notes (Signed)
   08/22/20 0912  TOC ED Mini Assessment  TOC Time spent with patient (minutes): 30  PING Used in TOC Assessment No  Admission or Readmission Diverted Yes  Interventions which prevented an admission or readmission Medication Review;Home Health Consult or Services  What brought you to the Emergency Department?  tremors, pain  Means of departure Car  Key Contact 1 [name] Westerhold [phone]  Spoke with [name]  Contact Date 08/22/20  Contact time [place] Phone Number [phone]  Call outcome spoke with daughter   Jayon Matton J. Clydene Laming, RN, BSN, [place] [phone] RNCM spoke with pt at bedside and daughter via telephone regarding discharge planning for [place]. Offered pt medicare.gov list of home health agencies to choose from.  Pt chose [facility] to render services. [place] [facility] notified. Patient made aware that [facility] will be in contact in 24-48 hours.  No DME needs identified at this time.

## 2020-08-22 NOTE — ED Notes (Signed)
Patient transported to X-ray 

## 2020-08-23 LAB — URINE CULTURE: Culture: <10000 — AB

## 2020-09-16 NOTE — Progress Notes (Deleted)
Assessment/Plan:   1.  Parkinsons Disease  -Patient is to continue carbidopa/levodopa 25/100, 2 tablets at 7 AM/2 tablets at at 10 AM/2 tablets at 2 PM/1 tablet at 6 PM   -Patient is to continue carbidopa/levodopa 50/200 at bedtime  -I have noted many times that patient actually does very well with his Parkinson's disease if medicines are administered correctly and on time.  If patient could be in assisted living or have consistent caregivers, I think he would clinically do much better.  -As with many prior visits, the biggest issue with this patient continues to be lack of continuity of consistent caregiving at home, with caregiving between the daughter and the girlfriend, who do not coordinate care.  We have been addressing this for years with myself and the social workers, but unfortunately it has not changed.       2.  PDD with hallucinations             -Pt no longer on nuplazid.  This was the case previously as well.  This comes from a central pharmacy and the family has not responded to the company when they tried to send this to the family.  This happened last year and has happened this year as well.   3.  REM behavior disorder             -He was on low-dose clonazepam, 0.25 mg.  They have stopped that.  At this point, I did not restarted.  We will just need to watch him closely.   4.  Leg pain             -Previously followed with Dr. Lynann Bologna      5.  Urinary urgency/frequency/incontinence             -He has a urologist, but follow-up has been inconsistent by the patient   Subjective:   Theodore King was seen today in follow up for Parkinsons disease.  My previous records were reviewed prior to todays visit as well as outside records available to me.  As with prior visits, we have had multiple phone calls from the patient's daughter and from his girlfriend, who failed to communicate with one another.  Each of their complaints and communications tend to contradict one  another.  We also have gotten calls from [facility] that they have been unable to send his Nuplazid to him because they are unable to get a hold of his family.  This has been a problem in the past as well.  They also called at one point stating that all of his levodopa had been lost.  Patient has been to the emergency room several times since last visit with complaints of arm pain.  This was felt to represent adhesive capsulitis or frozen shoulder.  He was in the emergency room March 18 with difficulty moving, but the symptoms have not improved by the time he got to the emergency room.  Daughter was with him and stated that the patient "often forgets to take his medication or just refuses to."  While in the emergency room that day, they did note a small subcutaneous foreign body that appeared metallic in that arm and he was supposed to follow-up with general surgery to have that removed.  Current movement d/o meds:  Carbidopa/levodopa 25/100, 2 tablets at 7 AM, 2 tablets at 10 AM, 2 tablets at 2 PM, 1 tablet at 6 PM Carbidopa/levodopa 50/200 at bedtime Nuplazid, 34 mg daily (pt  not taking)  Prior medications: Clonazepam 0.25 mg.  Worked well, but they stopped it; entacapone (discontinued last visit because of memory change; Nuplazid (appears that he gets taken off and on, depending on whether or not the family response to [facility] to receive the medication)    ALLERGIES:  No Known Allergies  CURRENT MEDICATIONS:  Outpatient Encounter Medications as of 09/23/2020  Medication Sig  . acetaminophen (TYLENOL) 325 MG tablet Take 2 tablets (650 mg total) by mouth every 6 (six) hours as needed for up to 30 doses for mild pain or moderate pain.  Marland Kitchen albuterol (VENTOLIN HFA) 108 (90 Base) MCG/ACT inhaler Inhale 2 puffs into the lungs every 4 (four) hours as needed for wheezing or shortness of breath.   Marland Kitchen aspirin EC 81 MG tablet Take 81 mg by mouth in the morning and at bedtime. Swallow whole.  .  carbidopa-levodopa (SINEMET CR) 50-200 MG tablet Take 1 tablet by mouth at bedtime.  . carbidopa-levodopa (SINEMET IR) 25-100 MG tablet Take 2 tablets at 7 AM, 2 tablets at 10 AM, 2 tablets at 2 PM and 1 tablet at 6 PM  . cephALEXin (KEFLEX) 500 MG capsule Take 1 capsule (500 mg total) by mouth 2 (two) times daily. (Patient not taking: No sig reported)  . cephALEXin (KEFLEX) 500 MG capsule Take 2 capsules (1,000 mg total) by mouth 2 (two) times daily.  . entacapone (COMTAN) 200 MG tablet TAKE 1 TABLET THREE TIMES DAILY AT 7AM, 10AM AND 2PM. (Patient taking differently: Take 200 mg by mouth 3 (three) times daily. 7AM, 10AM AND 2PM.)  . ibuprofen (ADVIL,MOTRIN) 400 MG tablet Take 400 mg by mouth every 6 (six) hours as needed.  . meclizine (ANTIVERT) 25 MG tablet Take 25 mg by mouth 2 (two) times daily as needed for dizziness.   . metFORMIN (GLUCOPHAGE) 500 MG tablet Take 500 mg by mouth daily with breakfast.   . Pimavanserin Tartrate (NUPLAZID) 34 MG CAPS Take 1 capsule (34 mg total) by mouth daily.  . tamsulosin (FLOMAX) 0.4 MG CAPS capsule Take 1 capsule (0.4 mg total) by mouth daily.  . Tiotropium Bromide-Olodaterol (STIOLTO RESPIMAT) 2.5-2.5 MCG/ACT AERS Inhale 2 puffs into the lungs daily.  . traZODone (DESYREL) 50 MG tablet Take 50 mg by mouth at bedtime.  . vitamin C (VITAMIN C) 500 MG tablet Take 1 tablet (500 mg total) by mouth daily.   No facility-administered encounter medications on file as of 09/23/2020.    Objective:   PHYSICAL EXAMINATION:    VITALS:   There were no vitals filed for this visit.  GEN:  The patient appears stated age and is in NAD. HEENT:  Normocephalic, atraumatic.  The mucous membranes are moist. The superficial temporal arteries are without ropiness or tenderness. CV:  RRR Lungs:  CTAB Neck/HEME:  There are no carotid bruits bilaterally.  Neurological examination:  Orientation: The patient is alert and oriented to person and place.  He is somewhat  confused Cranial nerves: There is good facial symmetry with facial hypomimia. The speech is fluent and intermittently has word finding trouble/dysphasia. Soft palate rises symmetrically and there is no tongue deviation. Hearing is intact to conversational tone. Sensation: Sensation is intact to light touch throughout Motor: Strength is at least antigravity x4.  Movement examination: Tone: There is nl tone in the UE/LE Abnormal movements: none Coordination:  There is  decremation with RAM's, with any form of RAMS, including alternating supination and pronation of the forearm, hand opening and closing, finger taps,  heel taps and toe taps. Gait and Station: Patient pushes off of the chair to arise.   The patient's stride length is fairly good.  He carries the cane but doesn't use it.     I have reviewed and interpreted the following labs independently    Chemistry      Component Value Date/Time   NA 138 08/22/2020 0131   NA 141 10/22/2016 0000   K 3.7 08/22/2020 0131   CL 103 08/22/2020 0131   CO2 29 08/22/2020 0131   BUN 17 08/22/2020 0131   BUN 14 10/22/2016 0000   CREATININE 1.00 08/22/2020 0131   GLU 112 10/22/2016 0000      Component Value Date/Time   CALCIUM 9.4 08/22/2020 0131   ALKPHOS 57 08/22/2020 0131   AST 14 (L) 08/22/2020 0131   ALT 10 08/22/2020 0131   BILITOT 2.2 (H) 08/22/2020 0131       Lab Results  Component Value Date   WBC 5.8 08/22/2020   HGB 14.3 08/22/2020   HCT 44.4 08/22/2020   MCV 85.5 08/22/2020   PLT 157 08/22/2020    No results found for: TSH   Total time spent on today's visit was *** minutes, including both face-to-face time and nonface-to-face time.  Time included that spent on review of records (prior notes available to me/labs/imaging if pertinent), discussing treatment and goals, answering patient's questions and coordinating care.  Cc:  [name], MD

## 2020-09-23 ENCOUNTER — Ambulatory Visit: Payer: Medicare HMO | Admitting: Neurology

## 2020-09-23 ENCOUNTER — Encounter: Payer: Self-pay | Admitting: Neurology

## 2020-09-25 ENCOUNTER — Telehealth: Payer: Self-pay | Admitting: Neurology

## 2020-09-25 NOTE — Telephone Encounter (Signed)
Patient dismissed from [facility] by Wells Guiles Tat, DO, effective 09/24/20. Dismissal letter sent out by 1st class mail JG

## 2020-10-22 ENCOUNTER — Telehealth: Payer: Self-pay | Admitting: Neurology

## 2020-10-22 NOTE — Telephone Encounter (Signed)
Patient's daughter called and requested refills on the patient's carbidopa levodopa (day time dosage). He will see he PCP next week to get another referral to a new neurologist. He only has 2-3 days left.   [facility] [address]

## 2020-10-23 NOTE — Telephone Encounter (Signed)
Unfortunately, we can only provide care/meds for 30 days post dismissal, as per our letter.  He was d/c on 4/20 and if he has 2-3 days left, he will need to get from PCP or go to [facility] for refill

## 2020-10-23 NOTE — Telephone Encounter (Signed)
Please give the office a call back.

## 2020-10-24 NOTE — Telephone Encounter (Signed)
Notified pt daughter--message and instructions below. Pt daughter voiced understanding.

## 2020-11-07 ENCOUNTER — Other Ambulatory Visit: Payer: Self-pay | Admitting: Neurology

## 2020-11-12 ENCOUNTER — Other Ambulatory Visit: Payer: Self-pay

## 2020-12-26 ENCOUNTER — Other Ambulatory Visit: Payer: Self-pay

## 2020-12-26 ENCOUNTER — Emergency Department (HOSPITAL_COMMUNITY)
Admission: EM | Admit: 2020-12-26 | Discharge: 2020-12-27 | Disposition: A | Discharge status: Home / Self Care | Payer: Medicare HMO | Attending: Emergency Medicine | Admitting: Emergency Medicine

## 2020-12-26 ENCOUNTER — Emergency Department (HOSPITAL_COMMUNITY): Payer: Medicare HMO

## 2020-12-26 DIAGNOSIS — R0789 Other chest pain: Secondary | ICD-10-CM | POA: Diagnosis present

## 2020-12-26 DIAGNOSIS — Z7982 Long term (current) use of aspirin: Secondary | ICD-10-CM | POA: Insufficient documentation

## 2020-12-26 DIAGNOSIS — R0602 Shortness of breath: Secondary | ICD-10-CM | POA: Diagnosis not present

## 2020-12-26 DIAGNOSIS — Z87891 Personal history of nicotine dependence: Secondary | ICD-10-CM | POA: Diagnosis not present

## 2020-12-26 DIAGNOSIS — I10 Essential (primary) hypertension: Secondary | ICD-10-CM | POA: Insufficient documentation

## 2020-12-26 DIAGNOSIS — Z79899 Other long term (current) drug therapy: Secondary | ICD-10-CM | POA: Diagnosis not present

## 2020-12-26 DIAGNOSIS — Z7984 Long term (current) use of oral hypoglycemic drugs: Secondary | ICD-10-CM | POA: Insufficient documentation

## 2020-12-26 DIAGNOSIS — R059 Cough, unspecified: Secondary | ICD-10-CM | POA: Insufficient documentation

## 2020-12-26 DIAGNOSIS — K2100 Gastro-esophageal reflux disease with esophagitis, without bleeding: Secondary | ICD-10-CM | POA: Diagnosis not present

## 2020-12-26 DIAGNOSIS — G20 Parkinson's disease: Secondary | ICD-10-CM | POA: Diagnosis not present

## 2020-12-26 DIAGNOSIS — Z8616 Personal history of COVID-19: Secondary | ICD-10-CM | POA: Insufficient documentation

## 2020-12-26 DIAGNOSIS — R2243 Localized swelling, mass and lump, lower limb, bilateral: Secondary | ICD-10-CM | POA: Diagnosis not present

## 2020-12-26 DIAGNOSIS — K219 Gastro-esophageal reflux disease without esophagitis: Secondary | ICD-10-CM

## 2020-12-26 LAB — BASIC METABOLIC PANEL
Anion gap: 6 (ref 5–15)
BUN: 19 mg/dL (ref 8–23)
CO2: 26 mmol/L (ref 22–32)
Calcium: 9.3 mg/dL (ref 8.9–10.3)
Chloride: 107 mmol/L (ref 98–111)
Creatinine, Ser: 1.06 mg/dL (ref 0.61–1.24)
GFR, Estimated: >60 mL/min (ref >60)
Glucose, Bld: 101 mg/dL — ABNORMAL HIGH (ref 70–99)
Potassium: 4.1 mmol/L (ref 3.5–5.1)
Sodium: 139 mmol/L (ref 135–145)

## 2020-12-26 LAB — CBC
HCT: 41.7 % (ref 39.0–52.0)
Hemoglobin: 13.4 g/dL (ref 13.0–17.0)
MCH: 27.5 pg (ref 26.0–34.0)
MCHC: 32.1 g/dL (ref 30.0–36.0)
MCV: 85.6 fL (ref 80.0–100.0)
Platelets: 142 10*3/uL — ABNORMAL LOW (ref 150–400)
RBC: 4.87 MIL/uL (ref 4.22–5.81)
RDW: 13 % (ref 11.5–15.5)
WBC: 5.6 10*3/uL (ref 4.0–10.5)
nRBC: 0 % (ref 0.0–0.2)

## 2020-12-26 LAB — TROPONIN I (HIGH SENSITIVITY): Troponin I (High Sensitivity): 5 ng/L (ref <18)

## 2020-12-26 NOTE — ED Provider Notes (Addendum)
Emergency Medicine Provider Triage Evaluation Note  Theodore King , a 75 y.o. male  was evaluated in triage.  Pt complains shortness of breath and chest pain since last night.  He has resolved, but last night it was intense in the chest.  There was associated nausea.  No previous history of blood clots.  Review of Systems  Positive: Chest pain, SOB Negative: Numbness, tingling  Physical Exam  There were no vitals taken for this visit. Gen:   Awake, no distress   Resp:  Normal effort  MSK:   Moves extremities without difficulty  Other:    Medical Decision Making  Medically screening exam initiated at 9:22 PM.  Appropriate orders placed.  Theodore King was informed that the remainder of the evaluation will be completed by another provider, this initial triage assessment does not replace that evaluation, and the importance of remaining in the ED until their evaluation is complete.     Sherrill Raring, PA-C 12/26/20 2123    Sherrill Raring, PA-C [id number]    Delora Fuel, MD [phone]

## 2020-12-26 NOTE — ED Triage Notes (Signed)
Pt c/o CP and SOB acute onset yesterday while at rest. Resolved on its own and has returned today. Denies  cardiac/pulmonary hx. Pt speaks in short sentences in triage, no labored breathing, voice hoarse, airway intact, and RR WDL.

## 2020-12-27 DIAGNOSIS — K2100 Gastro-esophageal reflux disease with esophagitis, without bleeding: Secondary | ICD-10-CM | POA: Diagnosis not present

## 2020-12-27 LAB — TROPONIN I (HIGH SENSITIVITY): Troponin I (High Sensitivity): 7 ng/L (ref <18)

## 2020-12-27 MED ORDER — CARBIDOPA-LEVODOPA 25-100 MG PO TABS
2.0000 | ORAL_TABLET | Freq: Once | ORAL | Status: AC
  Administered 2020-12-27: 2 via ORAL
  Filled 2020-12-27: qty 2

## 2020-12-27 MED ORDER — AMLODIPINE BESYLATE 5 MG PO TABS
5.0000 mg | ORAL_TABLET | Freq: Once | ORAL | Status: AC
  Administered 2020-12-27: 5 mg via ORAL
  Filled 2020-12-27: qty 1

## 2020-12-27 MED ORDER — AMLODIPINE BESYLATE 5 MG PO TABS: 5.0000 mg | ORAL_TABLET | Freq: Every day | ORAL | 0 refills | Status: DC

## 2020-12-27 MED ORDER — FUROSEMIDE 20 MG PO TABS
20.0000 mg | ORAL_TABLET | Freq: Once | ORAL | Status: AC
  Administered 2020-12-27: 20 mg via ORAL
  Filled 2020-12-27: qty 1

## 2020-12-27 MED ORDER — ALBUTEROL SULFATE HFA 108 (90 BASE) MCG/ACT IN AERS
2.0000 | INHALATION_SPRAY | Freq: Once | RESPIRATORY_TRACT | Status: AC
  Administered 2020-12-27: 2 via RESPIRATORY_TRACT
  Filled 2020-12-27: qty 6.7

## 2020-12-27 MED ORDER — FUROSEMIDE 20 MG PO TABS: 20.0000 mg | ORAL_TABLET | Freq: Every day | ORAL | 0 refills | Status: DC | PRN

## 2020-12-27 MED ORDER — OMEPRAZOLE 20 MG PO CPDR
20.0000 mg | DELAYED_RELEASE_CAPSULE | Freq: Every day | ORAL | 0 refills | Status: DC
Start: 2020-12-27 — End: 2021-09-24

## 2020-12-27 MED ORDER — PANTOPRAZOLE SODIUM 20 MG PO TBEC
20.0000 mg | DELAYED_RELEASE_TABLET | Freq: Once | ORAL | Status: AC
  Administered 2020-12-27: 20 mg via ORAL
  Filled 2020-12-27: qty 1

## 2020-12-27 NOTE — ED Notes (Signed)
Pt moved top H19 awaiting ride around 12:30-1300

## 2020-12-27 NOTE — ED Provider Notes (Addendum)
[facility] EMERGENCY DEPARTMENT Provider Note   CSN: [id number] Arrival date & time: 12/26/20  2038     History Chief Complaint  Patient presents with   Shortness of Breath   Chest Pain    Theodore King is a 75 y.o. male.  HPI Patient reports for several weeks he has been feeling short of breath particular with lying flat.  He reports he needs to sleep in his recliner slightly inclined.  He reports he starts to feel like he is got a tightness through the lower part of the chest and like he is getting stuff coming up in the back of his throat with some feeling of closing and burning.  Patient reports symptoms are not that noticeable during the daytime.  He reports he has small amount of cough but has not had any fevers or general body aches.  He reports he does get some swelling around his ankles.  He particularly points out he gets a lot of swelling around the right knee.  Patient reports he did run out of his inhaler about a week ago.  He does not smoke.  Very distant smoking history greater than 40 years ago.  However treated for COPD.  Patient reports that he does eat fairly late at night.  He reports snacking with peanut butter squares and also intermittently drinking coffee and tea in the evening.    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   SOB (shortness of breath) 03/23/2018   Unspecified vitamin D deficiency    Unspecified vitamin D deficiency    Vertigo, benign paroxysmal 11/05/2016   Vitamin D deficiency  05/18/2013    Patient Active Problem List   Diagnosis Date Noted   History of 2019 novel coronavirus disease (COVID-19) 04/24/2019   Dysphonia 10/16/2018   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   Former smoker 08/15/2018   SOB (shortness of breath) 03/23/2018   Vertigo, benign paroxysmal 11/05/2016   Abnormal dreams 09/06/2013   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   Hyperglycemia 05/18/2013   Paralysis agitans ([place]) 05/18/2013   Vitamin D deficiency 05/18/2013   Hyperlipidemia LDL goal < 100 05/18/2013   Parkinson's disease ([place]) 10/26/2012    Past Surgical History:  Procedure Laterality Date   APPENDECTOMY     ROTATOR CUFF REPAIR     right       Family History  Problem Relation Age of Onset   Cancer Mother    Cancer Father    Stroke Brother    Cancer Sister    Healthy Daughter    Cancer Sister    Lung cancer Brother    Healthy Daughter    Colon cancer Neg Hx    Esophageal cancer Neg Hx    Rectal cancer Neg Hx    Stomach cancer Neg Hx     Social History   Tobacco Use   Smoking status: Former    Packs/day: 0.50    Years: 20.00    Pack years: 10.00    Types: Cigarettes  Start date: 08/14/1953    Quit date: 05/23/1974    Years since quitting: 46.6   Smokeless tobacco: Never  Vaping Use   Vaping Use: Never used  Substance Use Topics   Alcohol use: Not Currently   Drug use: No    Home Medications Prior to Admission medications   Medication Sig Start Date End Date Taking? Authorizing Provider  amLODipine (NORVASC) 5 MG tablet Take 1 tablet (5 mg total) by mouth daily. 12/27/20  Yes Charlesetta Shanks, MD  furosemide (LASIX) 20 MG tablet Take 1 tablet (20 mg total) by mouth daily as needed for edema. 12/27/20  Yes Charlesetta Shanks, MD  omeprazole (PRILOSEC) 20 MG capsule Take 1 capsule (20 mg total) by mouth daily. 12/27/20  Yes Charlesetta Shanks, MD  acetaminophen (TYLENOL) 325 MG tablet Take 2 tablets (650 mg total) by mouth every  6 (six) hours as needed for up to 30 doses for mild pain or moderate pain. 06/04/20   Wyvonnia Dusky, MD  albuterol (VENTOLIN HFA) 108 (90 Base) MCG/ACT inhaler Inhale 2 puffs into the lungs every 4 (four) hours as needed for wheezing or shortness of breath.  10/03/18   [provider]  aspirin EC 81 MG tablet Take 81 mg by mouth in the morning and at bedtime. Swallow whole.    [provider]  carbidopa-levodopa (SINEMET CR) 50-200 MG tablet Take 1 tablet by mouth at bedtime. 04/02/20   Tat, Eustace Quail, DO  carbidopa-levodopa (SINEMET IR) 25-100 MG tablet Take 2 tablets at 7 AM, 2 tablets at 10 AM, 2 tablets at 2 PM and 1 tablet at 6 PM 07/08/20   Tat, Rebecca S, DO  cephALEXin (KEFLEX) 500 MG capsule Take 1 capsule (500 mg total) by mouth 2 (two) times daily. Patient not taking: No sig reported 04/16/20   Vanessa Kick, MD  cephALEXin (KEFLEX) 500 MG capsule Take 2 capsules (1,000 mg total) by mouth 2 (two) times daily. 08/22/20   Charlesetta Shanks, MD  entacapone (COMTAN) 200 MG tablet TAKE 1 TABLET THREE TIMES DAILY AT 7AM, 10AM AND 2PM. Patient taking differently: Take 200 mg by mouth 3 (three) times daily. 7AM, 10AM AND 2PM. 10/29/19   Tat, Eustace Quail, DO  ibuprofen (ADVIL,MOTRIN) 400 MG tablet Take 400 mg by mouth every 6 (six) hours as needed.    [provider]  meclizine (ANTIVERT) 25 MG tablet Take 25 mg by mouth 2 (two) times daily as needed for dizziness.  10/02/18   [provider]  metFORMIN (GLUCOPHAGE) 500 MG tablet Take 500 mg by mouth daily with breakfast.     [provider]  Pimavanserin Tartrate (NUPLAZID) 34 MG CAPS Take 1 capsule (34 mg total) by mouth daily. 07/02/20   Tat, Eustace Quail, DO  tamsulosin (FLOMAX) 0.4 MG CAPS capsule Take 1 capsule (0.4 mg total) by mouth daily. 04/16/20   Vanessa Kick, MD  Tiotropium Bromide-Olodaterol (STIOLTO RESPIMAT) 2.5-2.5 MCG/ACT AERS Inhale 2 puffs into the lungs daily. 04/24/19   Margaretha Seeds, MD   traZODone (DESYREL) 50 MG tablet Take 50 mg by mouth at bedtime. 08/17/20   [provider]  vitamin C (VITAMIN C) 500 MG tablet Take 1 tablet (500 mg total) by mouth daily. 04/11/19   Allie Bossier, MD    Allergies    Patient has no known allergies.  Review of Systems   Review of Systems 10 systems reviewed and negative except as per HPI Physical Exam Updated Vital Signs BP (!) 174/88  Pulse 70   Temp 98.7 F (37.1 C) (Oral)   Resp 19   Ht 6' (1.829 m)   Wt 93 kg   SpO2 100%   BMI 27.80 kg/m   Physical Exam Constitutional:      Comments: Patient is alert and nontoxic.  He is interactive.  He is situationally appropriate.  Recall seems to be fairly well intact.  He does have some tremor of the extremities with movement consistent with Parkinson's  HENT:     Head: Normocephalic and atraumatic.     Mouth/Throat:     Pharynx: Oropharynx is clear.  Eyes:     Extraocular Movements: Extraocular movements intact.  Cardiovascular:     Rate and Rhythm: Normal rate and regular rhythm.  Pulmonary:     Effort: Pulmonary effort is normal.     Breath sounds: Normal breath sounds.     Comments: Lungs are clear.  Good airflow to the bases Abdominal:     General: There is no distension.     Palpations: Abdomen is soft.     Tenderness: There is no abdominal tenderness. There is no guarding.  Musculoskeletal:     Comments: Trace pitting edema at the ankles and pretibial surfaces.  No asymmetry.  Calf soft and nontender.  Slight pronounced arthritic prominence of the right knee.  No effusion  Skin:    General: Skin is warm and dry.  Neurological:     Comments: Patient is alert.  He is a fairly good historian regarding his symptoms over the past several weeks.  He does have tremor of the upper extremities with intentional activities.  Subtle pill-rolling at rest.  No focal motor deficits  Psychiatric:        Mood and Affect: Mood normal.    ED Results / Procedures /  Treatments   Labs (all labs ordered are listed, but only abnormal results are displayed) Labs Reviewed  BASIC METABOLIC PANEL - Abnormal; Notable for the following components:      Result Value   Glucose, Bld 101 (*)    All other components within normal limits  CBC - Abnormal; Notable for the following components:   Platelets 142 (*)    All other components within normal limits  TROPONIN I (HIGH SENSITIVITY)  TROPONIN I (HIGH SENSITIVITY)    EKG EKG Interpretation  Date/Time:  Friday December 26 2020 21:31:34 EDT Ventricular Rate:  73 PR Interval:  196 QRS Duration: 98 QT Interval:  382 QTC Calculation: 420 R Axis:   51 Text Interpretation: Normal sinus rhythm Normal ECG agree, no change from previous Confirmed by Charlesetta Shanks [phone]) on 12/27/2020 7:25:11 AM  Radiology DG Chest 2 View  Result Date: 12/26/2020 CLINICAL DATA:  Shortness of breath and weakness. EXAM: CHEST - 2 VIEW COMPARISON:  April 20, 2020 FINDINGS: Mild, chronic appearing increased bibasilar lung markings are seen without evidence of an acute infiltrate, pleural effusion or pneumothorax. There is stable elevation of the left hemidiaphragm. The heart size and mediastinal contours are within normal limits. Degenerative changes are seen throughout the thoracic spine. IMPRESSION: Stable exam without active cardiopulmonary disease. Electronically Signed   By: Virgina Norfolk M.D.   On: 12/26/2020 22:18    Procedures Procedures   Medications Ordered in ED Medications  carbidopa-levodopa (SINEMET IR) 25-100 MG per tablet immediate release 2 tablet (has no administration in time range)  amLODipine (NORVASC) tablet 5 mg (has no administration in time range)  furosemide (LASIX) tablet 20 mg (has no administration  in time range)  albuterol (VENTOLIN HFA) 108 (90 Base) MCG/ACT inhaler 2 puff (has no administration in time range)  pantoprazole (PROTONIX) EC tablet 20 mg (has no administration in time range)    ED  Course  I have reviewed the triage vital signs and the nursing notes.  Pertinent labs & imaging results that were available during my care of the patient were reviewed by me and considered in my medical decision making (see chart for details).    MDM Rules/Calculators/A&P                           Patient presents describing symptoms that are worse at night and in supine position.  He perceives a general tightness in his chest feeling like it is harder to get a deep breath.  He also endorses perception of some burning and fullness in his throat.  Symptoms sound suggestive of GERD mediated nighttime bronchospasm or laryngospasm.  Patient does describe snacking and consuming caffeine in the later hours of the evening.  No PPI listed in medications.  Will add daily Protonix and instructions for reflux management.  2 sets of troponins are negative.  EKG normal without ischemic changes.  At this time I have lower suspicion for cardiac ischemic presentation.  Patient is moderately hypertensive.  No signs of hypertensive emergency.  Prior vital sign review shows borderline hypertension.  Will add low-dose amlodipine for blood pressure control.  Patient has very mild peripheral edema without crackles on exam or congestion on chest x-ray.  Will give short course of low-dose Lasix.  Recommendations for follow-up with PCP within the next 5 to 7 days for recheck and determination for ongoing use of diuretic and response to amlodipine.  08:41 I reviewed the plan with the patient's daughter.  She reports he does have a follow-up appointment in August 1 for recheck as well.  She will be able to pick the patient up at about 1230.  She is at work currently in [place] and will pick him up as soon as possible. Final Clinical Impression(s) / ED Diagnoses Final diagnoses:  Gastroesophageal reflux disease, unspecified whether esophagitis present  Hypertension, unspecified type  Parkinson disease ([place])    Rx / DC  Orders ED Discharge Orders          Ordered    amLODipine (NORVASC) 5 MG tablet  Daily        12/27/20 0821    furosemide (LASIX) 20 MG tablet  Daily PRN        12/27/20 0821    omeprazole (PRILOSEC) 20 MG capsule  Daily        12/27/20 [id number]             Charlesetta Shanks, MD 12/27/20 [id number]    Charlesetta Shanks, MD 12/27/20 [phone]

## 2020-12-27 NOTE — ED Notes (Signed)
DC instructions reviewed with pt/family. Understanding was verbalized. PT DC.

## 2020-12-27 NOTE — Discharge Instructions (Addendum)
1.  You are being started on a blood pressure medication called amlodipine.  Take 1 tablet daily in the morning.  Monitor your blood pressures at home.  If your blood pressures are less than 130/80, do not take a tablet.  Keep a log of your blood pressures and when you take the medication to show your doctor at your follow-up visit. 2.  You have some swelling of your legs.  It is fairly mild.  You were given 1 dose of Lasix to help excrete fluid.  You may take a dose of Lasix, 20 mg if needed for swelling around the ankles and feet.  Try to elevate your feet when you are not walking around or actively doing something. 3.  You describe symptoms suggestive of gastroesophageal reflux disease.  Read attached information.  Try to follow dietary recommendations.  Start taking Prilosec each morning about 30 minutes before you eat anything.  It will take this medication about a week to be effective.  It is meant to be taken daily and not on an as-needed basis. 4.  Return to the emergency department if you are having any worsening or concerning symptoms.

## 2021-01-15 ENCOUNTER — Other Ambulatory Visit: Payer: Self-pay | Admitting: Neurology

## 2021-03-03 ENCOUNTER — Emergency Department (HOSPITAL_COMMUNITY)
Admission: EM | Admit: 2021-03-03 | Discharge: 2021-03-04 | Disposition: A | Discharge status: Home / Self Care | Payer: Medicare HMO | Attending: Emergency Medicine | Admitting: Emergency Medicine

## 2021-03-03 ENCOUNTER — Other Ambulatory Visit: Payer: Self-pay

## 2021-03-03 ENCOUNTER — Encounter (HOSPITAL_COMMUNITY): Payer: Self-pay

## 2021-03-03 DIAGNOSIS — G20 Parkinson's disease: Secondary | ICD-10-CM | POA: Insufficient documentation

## 2021-03-03 DIAGNOSIS — N3000 Acute cystitis without hematuria: Secondary | ICD-10-CM | POA: Insufficient documentation

## 2021-03-03 DIAGNOSIS — Z7984 Long term (current) use of oral hypoglycemic drugs: Secondary | ICD-10-CM | POA: Diagnosis not present

## 2021-03-03 DIAGNOSIS — Z87891 Personal history of nicotine dependence: Secondary | ICD-10-CM | POA: Diagnosis not present

## 2021-03-03 DIAGNOSIS — Z8616 Personal history of COVID-19: Secondary | ICD-10-CM | POA: Insufficient documentation

## 2021-03-03 DIAGNOSIS — Z7982 Long term (current) use of aspirin: Secondary | ICD-10-CM | POA: Diagnosis not present

## 2021-03-03 DIAGNOSIS — Z79899 Other long term (current) drug therapy: Secondary | ICD-10-CM | POA: Diagnosis not present

## 2021-03-03 DIAGNOSIS — R739 Hyperglycemia, unspecified: Secondary | ICD-10-CM | POA: Diagnosis not present

## 2021-03-03 LAB — URINALYSIS, ROUTINE W REFLEX MICROSCOPIC
Bilirubin Urine: NEGATIVE
Glucose, UA: NEGATIVE mg/dL
Ketones, ur: NEGATIVE mg/dL
Nitrite: NEGATIVE
Protein, ur: NEGATIVE mg/dL
Specific Gravity, Urine: 1.01 (ref 1.005–1.030)
pH: 5 (ref 5.0–8.0)

## 2021-03-03 LAB — CBC
HCT: 37.1 % — ABNORMAL LOW (ref 39.0–52.0)
Hemoglobin: 12 g/dL — ABNORMAL LOW (ref 13.0–17.0)
MCH: 27.8 pg (ref 26.0–34.0)
MCHC: 32.3 g/dL (ref 30.0–36.0)
MCV: 85.9 fL (ref 80.0–100.0)
Platelets: 144 10*3/uL — ABNORMAL LOW (ref 150–400)
RBC: 4.32 MIL/uL (ref 4.22–5.81)
RDW: 13.4 % (ref 11.5–15.5)
WBC: 4 10*3/uL (ref 4.0–10.5)
nRBC: 0 % (ref 0.0–0.2)

## 2021-03-03 LAB — COMPREHENSIVE METABOLIC PANEL
ALT: 6 U/L (ref 0–44)
AST: 15 U/L (ref 15–41)
Albumin: 3.5 g/dL (ref 3.5–5.0)
Alkaline Phosphatase: 86 U/L (ref 38–126)
Anion gap: 5 (ref 5–15)
BUN: 15 mg/dL (ref 8–23)
CO2: 27 mmol/L (ref 22–32)
Calcium: 9 mg/dL (ref 8.9–10.3)
Chloride: 109 mmol/L (ref 98–111)
Creatinine, Ser: 0.99 mg/dL (ref 0.61–1.24)
GFR, Estimated: >60 mL/min (ref >60)
Glucose, Bld: 123 mg/dL — ABNORMAL HIGH (ref 70–99)
Potassium: 4.1 mmol/L (ref 3.5–5.1)
Sodium: 141 mmol/L (ref 135–145)
Total Bilirubin: 1.2 mg/dL (ref 0.3–1.2)
Total Protein: 6.2 g/dL — ABNORMAL LOW (ref 6.5–8.1)

## 2021-03-03 LAB — CBG MONITORING, ED: Glucose-Capillary: 143 mg/dL — ABNORMAL HIGH (ref 70–99)

## 2021-03-03 LAB — LIPASE, BLOOD: Lipase: 163 U/L — ABNORMAL HIGH (ref 11–51)

## 2021-03-03 MED ORDER — CARBIDOPA-LEVODOPA 25-100 MG PO TABS
1.0000 | ORAL_TABLET | Freq: Three times a day (TID) | ORAL | Status: DC
  Administered 2021-03-03: 1 via ORAL
  Filled 2021-03-03: qty 1

## 2021-03-03 MED ORDER — CEPHALEXIN 500 MG PO CAPS: 500.0000 mg | ORAL_CAPSULE | Freq: Three times a day (TID) | ORAL | 0 refills | Status: AC

## 2021-03-03 NOTE — ED Triage Notes (Signed)
Patient arrived by [facility] from home after feeling like muscles locking up the past few days. Patient has parkinson disease and taking meds as prescribed. Patient states that neurologist aware of same and also has been having pain around his belt line.

## 2021-03-03 NOTE — ED Provider Notes (Signed)
Emergency Medicine Provider Triage Evaluation Note  Theodore King , a 75 y.o. male  was evaluated in triage.  Pt complains of muscle aches.  Patient says that throughout the past 3 weeks his muscles have been freezing.  He is unable to move them as much.  This is intermittent.  He complains of this mostly in his lower extremities.  He also says that both of his big toes are swollen and stiff. He has a history of Parkinson's and he is on carbidopa levodopa.  His neurologist is aware of the symptoms per patient.  They have made no recent medication changes.  He presents today to the symptoms have gradually been worsening.  Says he has associated abdominal pain that is present at his belt line.  He denies any chest pain, shortness of breath, nausea, vomiting.  Review of Systems  Positive: Muscle weakness, abdominal pain Negative: Chest pain, shortness of breath, nausea, vomiting, diarrhea, urinary symptoms  Physical Exam  BP 132/64 (BP Location: Left Arm)   Pulse 65   Temp 98.6 F (37 C) (Oral)   Resp 16   SpO2 100%  Gen:   Awake, no distress  Resp:  Normal effort  MSK:   Moves extremities without difficulty  Other:  Patient with slurred speech.  He has bilateral weakness to lower extremity that is about 3 out of 5 in strength.  Upper extremity is 5 out of 5 strength bilaterally.  Medical Decision Making  Medically screening exam initiated at 1:32 PM.  Appropriate orders placed.  GREGG WINCHELL was informed that the remainder of the evaluation will be completed by another provider, this initial triage assessment does not replace that evaluation, and the importance of remaining in the ED until their evaluation is complete.   Sheila Oats 03/03/21 1336    Daleen Bo, MD 03/03/21 1807

## 2021-03-03 NOTE — ED Provider Notes (Signed)
[facility] EMERGENCY DEPARTMENT Provider Note  CSN: [id number] Arrival date & time: 03/03/21 1244    History No chief complaint on file.   Theodore King is a 75 y.o. male with history of parkinson's reports he has been more stiff in recent weeks. He reports his muscles freeze and he has trouble walking. This happened today and so EMS ws called to help him up brought him to the ED for evaluation. He take Sinemet, reports compliance with meds. HE was previously seeing Dr. [facility], but was dismissed from their clinic in April 2022, unsure who he is seeing now. Attempted to call Daughter but no answer. Patient report some discomfort 'at his belt line' but no N/V/D or dysuria.    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   SOB (shortness of breath) 03/23/2018   Unspecified vitamin D deficiency    Unspecified vitamin D deficiency    Vertigo, benign paroxysmal 11/05/2016   Vitamin D deficiency 05/18/2013    Past Surgical History:  Procedure Laterality Date   APPENDECTOMY     ROTATOR CUFF REPAIR     right    Family History  Problem Relation Age of Onset   Cancer Mother    Cancer Father    Stroke Brother    Cancer Sister    Healthy Daughter    Cancer Sister    Lung cancer Brother    Healthy Daughter    Colon cancer Neg Hx    Esophageal cancer Neg Hx    Rectal cancer Neg Hx    Stomach cancer Neg Hx     Social History   Tobacco Use   Smoking status: Former    Packs/day: 0.50    Years: 20.00    Pack years: 10.00     Types: Cigarettes    Start date: 08/14/1953    Quit date: 05/23/1974    Years since quitting: 46.8   Smokeless tobacco: Never  Vaping Use   Vaping Use: Never used  Substance Use Topics   Alcohol use: Not Currently   Drug use: No     Home Medications Prior to Admission medications   Medication Sig Start Date End Date Taking? Authorizing Provider  cephALEXin (KEFLEX) 500 MG capsule Take 1 capsule (500 mg total) by mouth 3 (three) times daily for 7 days. 03/03/21 03/10/21 Yes Truddie Hidden, MD  acetaminophen (TYLENOL) 325 MG tablet Take 2 tablets (650 mg total) by mouth every 6 (six) hours as needed for up to 30 doses for mild pain or moderate pain. 06/04/20   Wyvonnia Dusky, MD  albuterol (VENTOLIN HFA) 108 (90 Base) MCG/ACT inhaler Inhale 2 puffs into the lungs every 4 (four) hours as needed for wheezing or shortness of breath.  10/03/18   [provider]  amLODipine (NORVASC) 5 MG tablet Take 1 tablet (5 mg total) by mouth daily. 12/27/20   Charlesetta Shanks, MD  aspirin EC 81 MG tablet Take 81 mg by mouth in the morning and at bedtime. Swallow whole.  [provider]  carbidopa-levodopa (SINEMET CR) 50-200 MG tablet Take 1 tablet by mouth at bedtime. 04/02/20   Tat, Eustace Quail, DO  carbidopa-levodopa (SINEMET IR) 25-100 MG tablet Take 2 tablets at 7 AM, 2 tablets at 10 AM, 2 tablets at 2 PM and 1 tablet at 6 PM 07/08/20   Tat, Rebecca S, DO  entacapone (COMTAN) 200 MG tablet TAKE 1 TABLET THREE TIMES DAILY AT 7AM, 10AM AND 2PM. Patient taking differently: Take 200 mg by mouth 3 (three) times daily. 7AM, 10AM AND 2PM. 10/29/19   Tat, Eustace Quail, DO  furosemide (LASIX) 20 MG tablet Take 1 tablet (20 mg total) by mouth daily as needed for edema. 12/27/20   Charlesetta Shanks, MD  ibuprofen (ADVIL,MOTRIN) 400 MG tablet Take 400 mg by mouth every 6 (six) hours as needed.    [provider]  meclizine (ANTIVERT) 25 MG tablet Take 25 mg by mouth 2 (two) times daily as needed  for dizziness.  10/02/18   [provider]  metFORMIN (GLUCOPHAGE) 500 MG tablet Take 500 mg by mouth daily with breakfast.     [provider]  omeprazole (PRILOSEC) 20 MG capsule Take 1 capsule (20 mg total) by mouth daily. 12/27/20   Charlesetta Shanks, MD  Pimavanserin Tartrate (NUPLAZID) 34 MG CAPS Take 1 capsule (34 mg total) by mouth daily. 07/02/20   Tat, Eustace Quail, DO  tamsulosin (FLOMAX) 0.4 MG CAPS capsule Take 1 capsule (0.4 mg total) by mouth daily. 04/16/20   Vanessa Kick, MD  Tiotropium Bromide-Olodaterol (STIOLTO RESPIMAT) 2.5-2.5 MCG/ACT AERS Inhale 2 puffs into the lungs daily. 04/24/19   Margaretha Seeds, MD  traZODone (DESYREL) 50 MG tablet Take 50 mg by mouth at bedtime. 08/17/20   [provider]  vitamin C (VITAMIN C) 500 MG tablet Take 1 tablet (500 mg total) by mouth daily. 04/11/19   Allie Bossier, MD     Allergies    Patient has no known allergies.   Review of Systems   Review of Systems A comprehensive review of systems was completed and negative except as noted in HPI.    Physical Exam BP (!) 186/82   Pulse 71   Temp 98.6 F (37 C) (Oral)   Resp 15   SpO2 99%   Physical Exam Vitals and nursing note reviewed.  Constitutional:      Appearance: Normal appearance.  HENT:     Head: Normocephalic and atraumatic.     Nose: Nose normal.     Mouth/Throat:     Mouth: Mucous membranes are moist.  Eyes:     Extraocular Movements: Extraocular movements intact.     Conjunctiva/sclera: Conjunctivae normal.  Cardiovascular:     Rate and Rhythm: Normal rate.  Pulmonary:     Effort: Pulmonary effort is normal.     Breath sounds: Normal breath sounds.  Abdominal:     General: Abdomen is flat.     Palpations: Abdomen is soft.     Tenderness: There is no abdominal tenderness. There is no guarding.  Musculoskeletal:        General: No swelling. Normal range of motion.     Cervical back: Neck supple.  Skin:    General: Skin is warm and  dry.  Neurological:     General: No focal deficit present.     Mental Status: He is alert.     Comments: Stiff muscles in all extremities but able to move everything slowly  Psychiatric:  Mood and Affect: Mood normal.     ED Results / Procedures / Treatments   Labs (all labs ordered are listed, but only abnormal results are displayed) Labs Reviewed  LIPASE, BLOOD - Abnormal; Notable for the following components:      Result Value   Lipase 163 (*)    All other components within normal limits  COMPREHENSIVE METABOLIC PANEL - Abnormal; Notable for the following components:   Glucose, Bld 123 (*)    Total Protein 6.2 (*)    All other components within normal limits  CBC - Abnormal; Notable for the following components:   Hemoglobin 12.0 (*)    HCT 37.1 (*)    Platelets 144 (*)    All other components within normal limits  URINALYSIS, ROUTINE W REFLEX MICROSCOPIC - Abnormal; Notable for the following components:   Hgb urine dipstick MODERATE (*)    Leukocytes,Ua LARGE (*)    Bacteria, UA RARE (*)    All other components within normal limits  CBG MONITORING, ED - Abnormal; Notable for the following components:   Glucose-Capillary 143 (*)    All other components within normal limits  URINE CULTURE    EKG None  Radiology No results found.  Procedures Procedures  Medications Ordered in the ED Medications  carbidopa-levodopa (SINEMET IR) 25-100 MG per tablet immediate release 1 tablet (has no administration in time range)     MDM Rules/Calculators/A&P MDM Patient with parkinson's having exacerbation of these symptoms. He had labs done in triage, mildly elevated Lipase of unclear significance, he denies any epigastric pain, back pain or vomiting. Attempted to contact daughter to clarify what he needs today but no answer, will try back again shortly.   ED Course  I have reviewed the triage vital signs and the nursing notes.  Pertinent labs & imaging results that  were available during my care of the patient were reviewed by me and considered in my medical decision making (see chart for details).  Clinical Course as of 03/03/21 2223  Tue Mar 03, 2021  1904 Attempted to call Daughter again, no answer.  [CS]  2023 Spoke with patient's daughter who reports she was at work when the patient began having trouble. He has history of same due to inconsistent compliance with his Sinemet. Often skips doses or misses the prescribed timing. She is comfortable taking him home as soon as we are able to collect a UA to eval signs of infection.  [CS]  2215 UA with likely UTI given his lower abdominal pain. Will send for culture, begin Abx and discharge home.  [CS]    Clinical Course User Index [CS] Truddie Hidden, MD    Final Clinical Impression(s) / ED Diagnoses Final diagnoses:  Parkinson's disease ([place])  Acute cystitis without hematuria    Rx / DC Orders ED Discharge Orders          Ordered    cephALEXin (KEFLEX) 500 MG capsule  3 times daily        03/03/21 2217             Truddie Hidden, MD 03/03/21 2223

## 2021-03-04 NOTE — ED Notes (Addendum)
Dc instructions reviewed with pt/family.  Both verbalized understanding.  Pt Dc.

## 2021-03-05 LAB — URINE CULTURE: Culture: NO GROWTH

## 2021-05-03 ENCOUNTER — Emergency Department (HOSPITAL_COMMUNITY)
Admission: EM | Admit: 2021-05-03 | Discharge: 2021-05-04 | Disposition: A | Discharge status: Home / Self Care | Payer: Medicare HMO | Attending: Emergency Medicine | Admitting: Emergency Medicine

## 2021-05-03 ENCOUNTER — Emergency Department (HOSPITAL_COMMUNITY): Payer: Medicare HMO

## 2021-05-03 ENCOUNTER — Encounter (HOSPITAL_COMMUNITY): Payer: Self-pay | Admitting: *Deleted

## 2021-05-03 ENCOUNTER — Other Ambulatory Visit: Payer: Self-pay

## 2021-05-03 DIAGNOSIS — Z8616 Personal history of COVID-19: Secondary | ICD-10-CM | POA: Diagnosis not present

## 2021-05-03 DIAGNOSIS — R531 Weakness: Secondary | ICD-10-CM | POA: Diagnosis not present

## 2021-05-03 DIAGNOSIS — Z7984 Long term (current) use of oral hypoglycemic drugs: Secondary | ICD-10-CM | POA: Diagnosis not present

## 2021-05-03 DIAGNOSIS — R296 Repeated falls: Secondary | ICD-10-CM | POA: Diagnosis not present

## 2021-05-03 DIAGNOSIS — G20 Parkinson's disease: Secondary | ICD-10-CM | POA: Insufficient documentation

## 2021-05-03 DIAGNOSIS — Z79899 Other long term (current) drug therapy: Secondary | ICD-10-CM | POA: Insufficient documentation

## 2021-05-03 DIAGNOSIS — Z87891 Personal history of nicotine dependence: Secondary | ICD-10-CM | POA: Insufficient documentation

## 2021-05-03 DIAGNOSIS — Z7982 Long term (current) use of aspirin: Secondary | ICD-10-CM | POA: Diagnosis not present

## 2021-05-03 LAB — COMPREHENSIVE METABOLIC PANEL
ALT: 6 U/L (ref 0–44)
AST: 12 U/L — ABNORMAL LOW (ref 15–41)
Albumin: 3.7 g/dL (ref 3.5–5.0)
Alkaline Phosphatase: 59 U/L (ref 38–126)
Anion gap: 6 (ref 5–15)
BUN: 15 mg/dL (ref 8–23)
CO2: 27 mmol/L (ref 22–32)
Calcium: 9.1 mg/dL (ref 8.9–10.3)
Chloride: 106 mmol/L (ref 98–111)
Creatinine, Ser: 1.11 mg/dL (ref 0.61–1.24)
GFR, Estimated: >60 mL/min (ref >60)
Glucose, Bld: 95 mg/dL (ref 70–99)
Potassium: 3.9 mmol/L (ref 3.5–5.1)
Sodium: 139 mmol/L (ref 135–145)
Total Bilirubin: 2 mg/dL — ABNORMAL HIGH (ref 0.3–1.2)
Total Protein: 6.7 g/dL (ref 6.5–8.1)

## 2021-05-03 LAB — CBC WITH DIFFERENTIAL/PLATELET
Abs Immature Granulocytes: 0.02 10*3/uL (ref 0.00–0.07)
Basophils Absolute: 0 10*3/uL (ref 0.0–0.1)
Basophils Relative: 0 %
Eosinophils Absolute: 0 10*3/uL (ref 0.0–0.5)
Eosinophils Relative: 1 %
HCT: 38.3 % — ABNORMAL LOW (ref 39.0–52.0)
Hemoglobin: 12.3 g/dL — ABNORMAL LOW (ref 13.0–17.0)
Immature Granulocytes: 0 %
Lymphocytes Relative: 22 %
Lymphs Abs: 1.1 10*3/uL (ref 0.7–4.0)
MCH: 27.6 pg (ref 26.0–34.0)
MCHC: 32.1 g/dL (ref 30.0–36.0)
MCV: 85.9 fL (ref 80.0–100.0)
Monocytes Absolute: 0.3 10*3/uL (ref 0.1–1.0)
Monocytes Relative: 6 %
Neutro Abs: 3.4 10*3/uL (ref 1.7–7.7)
Neutrophils Relative %: 71 %
Platelets: 125 10*3/uL — ABNORMAL LOW (ref 150–400)
RBC: 4.46 MIL/uL (ref 4.22–5.81)
RDW: 13.2 % (ref 11.5–15.5)
WBC: 4.9 10*3/uL (ref 4.0–10.5)
nRBC: 0 % (ref 0.0–0.2)

## 2021-05-03 NOTE — ED Triage Notes (Signed)
Pt fell several times in the past 2 days  he si c/o pain on his rt abd and chest

## 2021-05-03 NOTE — ED Provider Notes (Signed)
Emergency Medicine Provider Triage Evaluation Note  Theodore King , a 75 y.o. male  was evaluated in triage.  Pt complains of falls.  He has had 2 falls over the last few days.  States he "bumped" his head.  He does not think he is on any blood thinners.  He has history of Parkinson's and walks with a cane at baseline.  He has some neck pain, mild headache, right rib pain, pain at his right lower ribs, upper abdomen.  He denies any midline T/L pain.  He has been ambulatory since.  Denies any presyncope symptoms.  No new paresthesias or weakness.   Review of Systems  Positive:  Negative:   Physical Exam  BP (!) 106/58 (BP Location: Right Arm)   Pulse 64   Temp 98.6 F (37 C) (Oral)   Resp 20   Ht 6' (1.829 m)   Wt 93 kg   SpO2 98%   BMI 27.81 kg/m  Gen:   Awake, no distress   Resp:  Normal effort  Chest:  Tenderness to right ribs MSK:   Moves extremities without difficulty, no midline T/L tenderness ABD:  No rebound or guarding Other:    Medical Decision Making  Medically screening exam initiated at 8:36 PM.  Appropriate orders placed.  Theodore King was informed that the remainder of the evaluation will be completed by another provider, this initial triage assessment does not replace that evaluation, and the importance of remaining in the ED until their evaluation is complete.  Multiple falls   Yalexa Blust A, PA-C 05/03/21 2037    Wyvonnia Dusky, MD 05/03/21 2107

## 2021-05-04 ENCOUNTER — Emergency Department (HOSPITAL_COMMUNITY): Payer: Medicare HMO

## 2021-05-04 DIAGNOSIS — R531 Weakness: Secondary | ICD-10-CM | POA: Diagnosis not present

## 2021-05-04 LAB — URINALYSIS, ROUTINE W REFLEX MICROSCOPIC
Bilirubin Urine: NEGATIVE
Glucose, UA: NEGATIVE mg/dL
Hgb urine dipstick: NEGATIVE
Ketones, ur: NEGATIVE mg/dL
Nitrite: NEGATIVE
Protein, ur: NEGATIVE mg/dL
Specific Gravity, Urine: 1.017 (ref 1.005–1.030)
pH: 5 (ref 5.0–8.0)

## 2021-05-04 NOTE — Discharge Instructions (Addendum)
Please follow-up with your primary care doctor as well as your neurologist. Return here for any new/acute changes.  Additionally, I discussed with your daughter that you have a UTI on you urine sample. However, it appears that the culture ran 2 months ago did not grow bacteria. Theodore King also denies any associated symptoms related to this, therefore I have decided to defer treatment and re culture.

## 2021-05-04 NOTE — ED Notes (Addendum)
Patient sleeping

## 2021-05-04 NOTE — ED Notes (Signed)
PT assisted to bathroom with walker.  Able to ambulate with walker, though transitions are quite difficult.  Brushed teeth and given afternoon snack.

## 2021-05-04 NOTE — Consult Note (Signed)
Neurosurgery Consultation  Reason for Consult: Right sided weakness Referring Physician: Smoot  CC: Falls  HPI: This is a 75 y.o. man w/ h/o PD that presents with 1 month of falls. 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 For the past month, he's had frequent falls, prior to these he feels the room tilting or spinning, falls to the left or right, retains consciousness. He also originally said his RUE feels weaker, but then said his left. Of note, he is left handed and PD Sx first appeared and were most severe on the right. He has baseline poor gait with typical PD features that is, subjectively, stable to him. No issues w/ bowel/bladder control or new (but baseline) BUE fine motor issues. No changes in meds that he's aware of.   ROS: A 14 point ROS was performed and is negative except as noted in the HPI.   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 His smoking use included cigarettes. He started smoking about 67 years ago. He has a 10.00 pack-year smoking history. He has never used smokeless tobacco. He reports that he does not currently use alcohol. He reports that he does not use drugs.  Exam: Vital signs in last 24 hours: Temp:  [97.7 F (36.5 C)-98.6 F (37 C)] 97.8 F (36.6 C) (11/28 1344) Pulse Rate:  [62-138] 73 (11/28 1344) Resp:  [14-20] 16 (11/28 1344) BP: (105-175)/(58-86) 158/76 (11/28 1344) SpO2:  [94 %-100 %] 100 % (11/28 1344) Weight:  [93 kg] 93 kg (11/27 2021) General: Awake, alert, cooperative, lying in bed in NAD Head: Normocephalic and atruamatic HEENT: Neck supple Pulmonary: breathing room air comfortably, no evidence of increased work of breathing Cardiac: RRR Abdomen: S NT ND Extremities: Warm and well perfused x4 Neuro: AOx3, PERRL, EOMI, FS, +rigidity + bradykinesia diffusely, some mild R>L tremor but appears mixed type Strength 5/5 x4, SILTx4, no hoffman's, no clonus, reflexes 2+ diffusely   Assessment and Plan: 75 y.o. man w/ falls, NSGY consulted due to concern of cervical myelopathy due to MRI findings. MRI C-spine personally reviewed, which shows moderate canal stenosis at C3-4, no cord signal change. On exam, the patient has no signs of myelopathy and clinical history is not consistent with myelopathy,  so I am fairly confident that he does not have cervical myelopathy. His description to me was consistent with vertiginous symptoms.   -no neurosurgical intervention indicated at this time, no need for scheduled follow up with me -please call with any concerns or questions  Judith Part, MD 05/04/21 3:44 PM [facility]

## 2021-05-04 NOTE — ED Notes (Signed)
This RN spoke with pt's dtr, [name] updated her on pt's care. PA will call pt to go over MRI

## 2021-05-04 NOTE — ED Provider Notes (Signed)
[facility] EMERGENCY DEPARTMENT Provider Note   CSN: [id number] Arrival date & time: 05/03/21  1850     History Chief Complaint  Patient presents with   Theodore King is a 75 y.o. male.  The history is provided by the patient and medical records.  Fall  75 year old male with history of hyperlipidemia, Parkinson's disease, vitamin D deficiency, presenting to the ED with frequent falls.  Reports over the past month he has felt like his right side has become weak and is now to the point of "giving out" on him causing him to fall.  States this is worsened over the past few days.  He fell twice yesterday and did strike his head but denies loss of consciousness.  States he has been asking his daughter to bring him to the hospital for evaluation over the past several week, just brought him to the hospital today.  He does walk with a cane at baseline.  Denies prior hx of strokes in the past but voices that he is concerned about this.  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   SOB (shortness of breath) 03/23/2018   Unspecified vitamin D deficiency    Unspecified vitamin D deficiency    Vertigo, benign paroxysmal 11/05/2016   Vitamin D deficiency 05/18/2013    Patient Active Problem List   Diagnosis Date Noted   History of 2019 novel coronavirus disease (COVID-19) 04/24/2019   Dysphonia 10/16/2018   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   Former smoker 08/15/2018   SOB (shortness of breath)  03/23/2018   Vertigo, benign paroxysmal 11/05/2016   Abnormal dreams 09/06/2013   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   Hyperglycemia 05/18/2013   Paralysis agitans ([place]) 05/18/2013   Vitamin D deficiency 05/18/2013   Hyperlipidemia LDL goal < 100 05/18/2013   Parkinson's disease ([place]) 10/26/2012    Past Surgical History:  Procedure Laterality Date   APPENDECTOMY     ROTATOR CUFF REPAIR     right       Family History  Problem Relation Age of Onset   Cancer Mother    Cancer Father    Stroke Brother    Cancer Sister    Healthy Daughter    Cancer Sister    Lung cancer Brother    Healthy Daughter    Colon cancer Neg Hx    Esophageal cancer Neg Hx    Rectal cancer Neg Hx    Stomach cancer Neg Hx     Social History   Tobacco Use   Smoking status: Former    Packs/day: 0.50    Years: 20.00    Pack years: 10.00    Types: Cigarettes    Start date: 08/14/1953    Quit date: 05/23/1974    Years since quitting: 46.9   Smokeless tobacco: Never  Vaping Use   Vaping Use: Never used  Substance Use Topics   Alcohol use: Not Currently   Drug use: No    Home Medications Prior to Admission  medications   Medication Sig Start Date End Date Taking? Authorizing Provider  acetaminophen (TYLENOL) 325 MG tablet Take 2 tablets (650 mg total) by mouth every 6 (six) hours as needed for up to 30 doses for mild pain or moderate pain. 06/04/20  Yes Wyvonnia Dusky, MD  albuterol (VENTOLIN HFA) 108 (90 Base) MCG/ACT inhaler Inhale 2 puffs into the lungs every 4 (four) hours as needed for wheezing or shortness of breath.  10/03/18  Yes [provider]  amLODipine (NORVASC) 5 MG tablet Take 1 tablet (5 mg total) by mouth daily. 12/27/20  Yes Charlesetta Shanks, MD  aspirin EC 81 MG tablet Take 81 mg by mouth in the morning and at bedtime. Swallow whole.   Yes [provider]  carbidopa-levodopa (SINEMET IR) 25-100 MG tablet Take 2 tablets at 7 AM, 2 tablets at 10 AM, 2  tablets at 2 PM and 1 tablet at 6 PM Patient taking differently: Take 1-2 tablets by mouth See admin instructions. Take 2 tablets at 7 AM, 2 tablets at 10 AM, 2 tablets at 2 PM and 1 tablet at 6 PM 07/08/20  Yes Tat, Wells Guiles S, DO  entacapone (COMTAN) 200 MG tablet TAKE 1 TABLET THREE TIMES DAILY AT 7AM, 10AM AND 2PM. Patient taking differently: Take 200 mg by mouth 3 (three) times daily. 7AM, 10AM AND 2PM. 10/29/19  Yes Tat, Eustace Quail, DO  furosemide (LASIX) 20 MG tablet Take 1 tablet (20 mg total) by mouth daily as needed for edema. 12/27/20  Yes Charlesetta Shanks, MD  ibuprofen (ADVIL,MOTRIN) 400 MG tablet Take 400 mg by mouth every 6 (six) hours as needed for headache or moderate pain.   Yes [provider]  meclizine (ANTIVERT) 25 MG tablet Take 25 mg by mouth 2 (two) times daily as needed for dizziness.  10/02/18  Yes [provider]  metFORMIN (GLUCOPHAGE) 500 MG tablet Take 500 mg by mouth daily with breakfast.    Yes [provider]  omeprazole (PRILOSEC) 20 MG capsule Take 1 capsule (20 mg total) by mouth daily. 12/27/20  Yes Pfeiffer, Jeannie Done, MD  Pimavanserin Tartrate (NUPLAZID) 34 MG CAPS Take 1 capsule (34 mg total) by mouth daily. 07/02/20  Yes Tat, Eustace Quail, DO  tamsulosin (FLOMAX) 0.4 MG CAPS capsule Take 1 capsule (0.4 mg total) by mouth daily. 04/16/20  Yes Hagler, Aaron Edelman, MD  Tiotropium Bromide-Olodaterol (STIOLTO RESPIMAT) 2.5-2.5 MCG/ACT AERS Inhale 2 puffs into the lungs daily. 04/24/19  Yes Margaretha Seeds, MD  traZODone (DESYREL) 50 MG tablet Take 50 mg by mouth at bedtime. 08/17/20  Yes [provider]  carbidopa-levodopa (SINEMET CR) 50-200 MG tablet Take 1 tablet by mouth at bedtime. 04/02/20   Tat, Eustace Quail, DO  vitamin C (VITAMIN C) 500 MG tablet Take 1 tablet (500 mg total) by mouth daily. Patient not taking: Reported on 05/04/2021 04/11/19   Allie Bossier, MD    Allergies    Patient has no known allergies.  Review of Systems   Review  of Systems  Neurological:  Positive for weakness (right side).       Frequent falls  All other systems reviewed and are negative.  Physical Exam Updated Vital Signs BP 138/78 (BP Location: Left Arm)   Pulse 65   Temp 98.6 F (37 C) (Oral)   Resp 15   Ht 6' (1.829 m)   Wt 93 kg   SpO2 100%   BMI 27.81 kg/m   Physical Exam Vitals and nursing note  reviewed.  Constitutional:      Appearance: He is well-developed.  HENT:     Head: Normocephalic and atraumatic.  Eyes:     Conjunctiva/sclera: Conjunctivae normal.     Pupils: Pupils are equal, round, and reactive to light.  Cardiovascular:     Rate and Rhythm: Normal rate and regular rhythm.     Heart sounds: Normal heart sounds.  Pulmonary:     Effort: Pulmonary effort is normal. No respiratory distress.     Breath sounds: Normal breath sounds. No rhonchi.  Abdominal:     General: Bowel sounds are normal.     Palpations: Abdomen is soft.     Tenderness: There is no abdominal tenderness. There is no rebound.  Musculoskeletal:        General: Normal range of motion.     Cervical back: Normal range of motion.  Skin:    General: Skin is warm and dry.  Neurological:     Mental Status: He is alert and oriented to person, place, and time.     Comments: AAOx3, able to answer questions and follow commands, right upper and lower extremity strength 4/5 compared with left that is 5/5    ED Results / Procedures / Treatments   Labs (all labs ordered are listed, but only abnormal results are displayed) Labs Reviewed  CBC WITH DIFFERENTIAL/PLATELET - Abnormal; Notable for the following components:      Result Value   Hemoglobin 12.3 (*)    HCT 38.3 (*)    Platelets 125 (*)    All other components within normal limits  COMPREHENSIVE METABOLIC PANEL - Abnormal; Notable for the following components:   AST 12 (*)    Total Bilirubin 2.0 (*)    All other components within normal limits  URINALYSIS, ROUTINE W REFLEX MICROSCOPIC     EKG None  Radiology DG Ribs Unilateral W/Chest Right  Result Date: 05/03/2021 CLINICAL DATA:  Fall. EXAM: RIGHT RIBS AND CHEST - 3+ VIEW COMPARISON:  Chest x-ray 12/26/2020. FINDINGS: No fracture or other bone lesions are seen involving the ribs. There is stable elevation of the left hemidiaphragm with gaseous distention of the stomach. One there is no evidence of pneumothorax or pleural effusion. Both lungs are clear. Heart size and mediastinal contours are within normal limits. IMPRESSION: Negative. Electronically Signed   By: Ronney Asters M.D.   On: 05/03/2021 21:34   CT HEAD WO CONTRAST (5MM)  Result Date: 05/03/2021 CLINICAL DATA:  Head and neck trauma EXAM: CT HEAD WITHOUT CONTRAST CT CERVICAL SPINE WITHOUT CONTRAST TECHNIQUE: Multidetector CT imaging of the head and cervical spine was performed following the standard protocol without intravenous contrast. Multiplanar CT image reconstructions of the cervical spine were also generated. COMPARISON:  08/22/2020 CT head no prior cervical spine CT FINDINGS: CT HEAD FINDINGS Brain: No evidence of acute infarction, hemorrhage, cerebral edema, mass, mass effect, or midline shift. No hydrocephalus or extra-axial fluid collection. Vascular: No hyperdense vessel. Atherosclerotic calcifications in the intracranial carotid and vertebral arteries. Skull: Normal. Negative for fracture or focal lesion. Sinuses/Orbits: No acute finding. Status post bilateral lens replacements. Other: The mastoid air cells are well aerated. CT CERVICAL SPINE FINDINGS Alignment: No listhesis. Skull base and vertebrae: No acute fracture. No primary bone lesion or focal pathologic process. Congenitally short pedicles, which narrow the AP diameter of the spinal canal. Soft tissues and spinal canal: No prevertebral fluid or swelling. No visible canal hematoma. Disc levels: Disc height loss and endplate degenerative changes, worst at  C5-C7. Mild spinal canal stenosis at C4-C5, and  C5-C6. Multilevel uncovertebral and facet arthropathy, which causes severe neural foraminal narrowing on the right at C4-C5. Upper chest: Negative. Other: None. IMPRESSION: 1.  No acute intracranial process. 2.  No acute fracture or traumatic listhesis in the cervical spine. Electronically Signed   By: Merilyn Baba M.D.   On: 05/03/2021 22:02   CT Cervical Spine Wo Contrast  Result Date: 05/03/2021 CLINICAL DATA:  Head and neck trauma EXAM: CT HEAD WITHOUT CONTRAST CT CERVICAL SPINE WITHOUT CONTRAST TECHNIQUE: Multidetector CT imaging of the head and cervical spine was performed following the standard protocol without intravenous contrast. Multiplanar CT image reconstructions of the cervical spine were also generated. COMPARISON:  08/22/2020 CT head no prior cervical spine CT FINDINGS: CT HEAD FINDINGS Brain: No evidence of acute infarction, hemorrhage, cerebral edema, mass, mass effect, or midline shift. No hydrocephalus or extra-axial fluid collection. Vascular: No hyperdense vessel. Atherosclerotic calcifications in the intracranial carotid and vertebral arteries. Skull: Normal. Negative for fracture or focal lesion. Sinuses/Orbits: No acute finding. Status post bilateral lens replacements. Other: The mastoid air cells are well aerated. CT CERVICAL SPINE FINDINGS Alignment: No listhesis. Skull base and vertebrae: No acute fracture. No primary bone lesion or focal pathologic process. Congenitally short pedicles, which narrow the AP diameter of the spinal canal. Soft tissues and spinal canal: No prevertebral fluid or swelling. No visible canal hematoma. Disc levels: Disc height loss and endplate degenerative changes, worst at C5-C7. Mild spinal canal stenosis at C4-C5, and C5-C6. Multilevel uncovertebral and facet arthropathy, which causes severe neural foraminal narrowing on the right at C4-C5. Upper chest: Negative. Other: None. IMPRESSION: 1.  No acute intracranial process. 2.  No acute fracture or  traumatic listhesis in the cervical spine. Electronically Signed   By: Merilyn Baba M.D.   On: 05/03/2021 22:02    Procedures Procedures   Medications Ordered in ED Medications - No data to display  ED Course  I have reviewed the triage vital signs and the nursing notes.  Pertinent labs & imaging results that were available during my care of the patient were reviewed by me and considered in my medical decision making (see chart for details).    MDM Rules/Calculators/A&P                           75 y.o. M here with right sided weakness for the past month.  States now he feels like his right side is "giving out" causing him to fall.  Reports fall x2 yesterday.  Did strike head but no LOC.  Denies fever or other infectious symptoms.  Has history of parkinsons and walks with cane at baseline.  On exam, he is AAOx3, no external signs of trauma noted.  He does appear somewhat weak in his right arm and leg compared with left.  CT head/neck negative.  Rib films also negative.  Labs reassuring.  Given his weakness with frequent falls, will obtain EKG, UA, MRI  brain and c-spine.  EKG non-ischemic.  MRI's and UA still pending.  Care will be signed out to oncoming provider.  If these are reassuring, feel he can be discharged home to follow-up with his OP neurologist.  May benefit from OP PT/OT evaluation if he is amenable and feels this would help.  Final Clinical Impression(s) / ED Diagnoses Final diagnoses:  Frequent falls  Right sided weakness    Rx / DC Orders ED Discharge  Orders     None        Larene Pickett, PA-C 05/04/21 7308    Quintella Reichert, MD 05/04/21 [phone]

## 2021-05-04 NOTE — ED Provider Notes (Signed)
Briefly: Hx of Parkinson's, frequent falls recently, some right sided weakness.   Plan: CT scan negative, awaiting MRI and labs to determine dispo. If negative will d/c, mention PT/OT at home.    Patients MRI resulted, MRI brain found to be normal. MRI C spine with   1. Motion degraded, incomplete examination. 2. Multilevel cervical disc degeneration with mild-to-moderate spinal stenosis at C3-4 and mild spinal stenosis at C4-5. 3. Moderate to severe right neural foraminal stenosis at C4-5. 4. Moderate neural foraminal stenosis on the right at C3-4 and bilaterally at C5-6 and C7-T1  Consulted neurology Dr Cheral Marker who suggested neurosurgery consult to determine management and dispo.  Consulted neurosurgery Dr. Zada Finders who agrees to come see patient in the ER.  Additionally, patient noted to have a UTI. Was diagnosed with same 2 months ago and treated with Keflex without improvement. Denies flank pain or dysuria, doubt pyelonephritis at this time. Will treat for cystitis.  Discussed plan Dr. Zada Finders who does not feel that patients symptoms are related to his spinal stenosis. Given that the patient reports some dizziness prior to his falls, he suspects potential BPPV etiology. Will recommend outpatient care with neurology for Parkinson's and further evaluation and management of these symptoms. Discussed plan with daughter [name] who states that she is trying to get the patient established with [facility] neurology as he was dismissed from his previous neurologist 6 months ago due to too many missed appointments.  She states that he has outstanding bills with [facility] neurology who he was established with years ago and must pay these in order to return to them.  She has plans to do so and set up an appointment this week.  I have advised her on the importance of establishing outpatient neurology care and she is understanding.  All questions answered and education on red flag symptoms that would prompt  immediate return provided.  Patient discharged in stable condition.  Findings and plan of care discussed with supervising physician Dr. Vanita Panda who is in agreement.       Theodore King 05/04/21 1512    Carmin Muskrat, MD 05/04/21 [phone]

## 2021-05-04 NOTE — ED Notes (Signed)
Pt in MRI.

## 2021-05-07 LAB — URINE CULTURE: Culture: 30000 — AB

## 2021-05-27 ENCOUNTER — Emergency Department (HOSPITAL_COMMUNITY)
Admission: EM | Admit: 2021-05-27 | Discharge: 2021-05-28 | Disposition: A | Discharge status: Home / Self Care | Payer: Medicare HMO | Attending: Emergency Medicine | Admitting: Emergency Medicine

## 2021-05-27 ENCOUNTER — Emergency Department (HOSPITAL_COMMUNITY): Payer: Medicare HMO

## 2021-05-27 ENCOUNTER — Other Ambulatory Visit: Payer: Self-pay

## 2021-05-27 ENCOUNTER — Encounter (HOSPITAL_COMMUNITY): Payer: Self-pay | Admitting: *Deleted

## 2021-05-27 DIAGNOSIS — Z7982 Long term (current) use of aspirin: Secondary | ICD-10-CM | POA: Diagnosis not present

## 2021-05-27 DIAGNOSIS — Z20822 Contact with and (suspected) exposure to covid-19: Secondary | ICD-10-CM | POA: Diagnosis not present

## 2021-05-27 DIAGNOSIS — G20 Parkinson's disease: Secondary | ICD-10-CM | POA: Insufficient documentation

## 2021-05-27 DIAGNOSIS — Z87891 Personal history of nicotine dependence: Secondary | ICD-10-CM | POA: Diagnosis not present

## 2021-05-27 DIAGNOSIS — R410 Disorientation, unspecified: Secondary | ICD-10-CM | POA: Diagnosis not present

## 2021-05-27 DIAGNOSIS — M79671 Pain in right foot: Secondary | ICD-10-CM | POA: Insufficient documentation

## 2021-05-27 DIAGNOSIS — R0602 Shortness of breath: Secondary | ICD-10-CM | POA: Insufficient documentation

## 2021-05-27 DIAGNOSIS — M79672 Pain in left foot: Secondary | ICD-10-CM | POA: Diagnosis not present

## 2021-05-27 DIAGNOSIS — Z8616 Personal history of COVID-19: Secondary | ICD-10-CM | POA: Diagnosis not present

## 2021-05-27 NOTE — ED Triage Notes (Signed)
Pt c/o bilateral great toe pain and reports falls x 2. No obvious wounds or injury noted to feet

## 2021-05-27 NOTE — ED Provider Notes (Signed)
Emergency Medicine Provider Triage Evaluation Note  Theodore King , a 75 y.o. male  was evaluated in triage.  Pt who presents and appears confused.  States that he has big toe pain but cannot tell me which one. Reports he has had a couple of falls in the last 24 hours.  Patient with history of Parkinson's.  Seems confused, unable to provide much insight into his presentation to the ER though he does state that he has chest pain and shortness of breath at this time.  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  Physical Exam  BP (!) 117/54 (BP Location: Left Arm)    Pulse 74    Temp 98.5 F (36.9 C) (Oral)    Resp 16    SpO2 100%  Gen:   Awake, no distress   Resp:  Normal effort  MSK:   Moves extremities without difficulty  Other:  Pulmonary exam complicated by patient's poor participation.  Overall nontoxic-appearing, abdominal exam is benign.  Patient without significant lower extremity edema.  When asked what year it is he says [birth year]; when asked what month he answers "10" multiple times then asks if that is a month.  States that he lives with his daughter.  Medical Decision Making  Medically screening exam initiated at 11:05 PM.  Appropriate orders placed.  KYAN YURKOVICH was informed that the remainder of the evaluation will be completed by another provider, this initial triage assessment does not replace that evaluation, and the importance of remaining in the ED until their evaluation is complete.  Work-up initiated.  This chart was dictated using voice recognition software, Dragon. Despite the best efforts of this provider to proofread and correct errors, errors may still occur which can change documentation meaning.     Aura Dials 16/10/96 0454    Delora Fuel, MD 09/81/19  Cindie Laroche

## 2021-05-28 ENCOUNTER — Emergency Department (HOSPITAL_COMMUNITY): Payer: Medicare HMO

## 2021-05-28 DIAGNOSIS — M79671 Pain in right foot: Secondary | ICD-10-CM | POA: Diagnosis not present

## 2021-05-28 LAB — COMPREHENSIVE METABOLIC PANEL
ALT: 12 U/L (ref 0–44)
AST: 13 U/L — ABNORMAL LOW (ref 15–41)
Albumin: 3.9 g/dL (ref 3.5–5.0)
Alkaline Phosphatase: 59 U/L (ref 38–126)
Anion gap: 7 (ref 5–15)
BUN: 20 mg/dL (ref 8–23)
CO2: 27 mmol/L (ref 22–32)
Calcium: 9.3 mg/dL (ref 8.9–10.3)
Chloride: 106 mmol/L (ref 98–111)
Creatinine, Ser: 1.12 mg/dL (ref 0.61–1.24)
GFR, Estimated: >60 mL/min (ref >60)
Glucose, Bld: 102 mg/dL — ABNORMAL HIGH (ref 70–99)
Potassium: 4.1 mmol/L (ref 3.5–5.1)
Sodium: 140 mmol/L (ref 135–145)
Total Bilirubin: 2.3 mg/dL — ABNORMAL HIGH (ref 0.3–1.2)
Total Protein: 7 g/dL (ref 6.5–8.1)

## 2021-05-28 LAB — PROTIME-INR
INR: 1 (ref 0.8–1.2)
Prothrombin Time: 12.7 seconds (ref 11.4–15.2)

## 2021-05-28 LAB — URINALYSIS, ROUTINE W REFLEX MICROSCOPIC
Bilirubin Urine: NEGATIVE
Glucose, UA: NEGATIVE mg/dL
Hgb urine dipstick: NEGATIVE
Ketones, ur: 5 mg/dL — AB
Nitrite: NEGATIVE
Protein, ur: NEGATIVE mg/dL
Specific Gravity, Urine: 1.026 (ref 1.005–1.030)
pH: 5 (ref 5.0–8.0)

## 2021-05-28 LAB — RESP PANEL BY RT-PCR (FLU A&B, COVID) ARPGX2
Influenza A by PCR: NEGATIVE
Influenza B by PCR: NEGATIVE
SARS Coronavirus 2 by RT PCR: NEGATIVE

## 2021-05-28 LAB — CBC WITH DIFFERENTIAL/PLATELET
Abs Immature Granulocytes: 0.01 10*3/uL (ref 0.00–0.07)
Basophils Absolute: 0 10*3/uL (ref 0.0–0.1)
Basophils Relative: 1 %
Eosinophils Absolute: 0 10*3/uL (ref 0.0–0.5)
Eosinophils Relative: 1 %
HCT: 39 % (ref 39.0–52.0)
Hemoglobin: 12.3 g/dL — ABNORMAL LOW (ref 13.0–17.0)
Immature Granulocytes: 0 %
Lymphocytes Relative: 25 %
Lymphs Abs: 1.3 10*3/uL (ref 0.7–4.0)
MCH: 27.3 pg (ref 26.0–34.0)
MCHC: 31.5 g/dL (ref 30.0–36.0)
MCV: 86.5 fL (ref 80.0–100.0)
Monocytes Absolute: 0.3 10*3/uL (ref 0.1–1.0)
Monocytes Relative: 6 %
Neutro Abs: 3.6 10*3/uL (ref 1.7–7.7)
Neutrophils Relative %: 67 %
Platelets: 135 10*3/uL — ABNORMAL LOW (ref 150–400)
RBC: 4.51 MIL/uL (ref 4.22–5.81)
RDW: 13.1 % (ref 11.5–15.5)
WBC: 5.2 10*3/uL (ref 4.0–10.5)
nRBC: 0 % (ref 0.0–0.2)

## 2021-05-28 LAB — BRAIN NATRIURETIC PEPTIDE: B Natriuretic Peptide: 10.6 pg/mL (ref 0.0–100.0)

## 2021-05-28 LAB — LACTIC ACID, PLASMA: Lactic Acid, Venous: 1.1 mmol/L (ref 0.5–1.9)

## 2021-05-28 LAB — TROPONIN I (HIGH SENSITIVITY)
Troponin I (High Sensitivity): 6 ng/L (ref <18)
Troponin I (High Sensitivity): 6 ng/L (ref <18)

## 2021-05-28 NOTE — ED Provider Notes (Signed)
[facility] EMERGENCY DEPARTMENT Provider Note   CSN: [id number] Arrival date & time: 05/27/21  2200     History Chief Complaint  Patient presents with   Foot Pain   Altered Mental Status    Theodore King is a 75 y.o. male who presents with confusion and foot pain.  There is a level 5 caveat due to altered mental status.  Unknown baseline mental status.  Does acknowledge that his feet hurt and he thinks it may have been for 2 weeks.  He states that he has been weak and falling.  He states that he is also had a cough for 8 months.  No other information is available at this time.   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   SOB (shortness of breath) 03/23/2018   Unspecified vitamin D deficiency    Unspecified vitamin D deficiency    Vertigo, benign paroxysmal 11/05/2016   Vitamin D deficiency 05/18/2013    Patient Active Problem List   Diagnosis Date Noted   History of 2019 novel coronavirus disease (COVID-19) 04/24/2019   Dysphonia 10/16/2018   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   Former smoker 08/15/2018   SOB (shortness of breath) 03/23/2018   Vertigo, benign paroxysmal 11/05/2016   Abnormal dreams 09/06/2013   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   Hyperglycemia 05/18/2013   Paralysis agitans ([place]) 05/18/2013   Vitamin D deficiency 05/18/2013   Hyperlipidemia LDL goal < 100 05/18/2013   Parkinson's disease ([place]) 10/26/2012    Past  Surgical History:  Procedure Laterality Date   APPENDECTOMY     ROTATOR CUFF REPAIR     right       Family History  Problem Relation Age of Onset   Cancer Mother    Cancer Father    Stroke Brother    Cancer Sister    Healthy Daughter    Cancer Sister    Lung cancer Brother    Healthy Daughter    Colon cancer Neg Hx    Esophageal cancer Neg Hx    Rectal cancer Neg Hx    Stomach cancer Neg Hx     Social History   Tobacco Use   Smoking status: Former    Packs/day: 0.50    Years: 20.00    Pack years: 10.00    Types: Cigarettes    Start date: 08/14/1953    Quit date: 05/23/1974    Years since quitting: 47.0   Smokeless tobacco: Never  Vaping Use   Vaping Use: Never used  Substance Use Topics   Alcohol use: Not Currently   Drug use: No    Home Medications Prior to Admission medications   Medication Sig Start Date End Date Taking? Authorizing Provider  acetaminophen (TYLENOL) 325 MG tablet Take 2 tablets (650 mg total) by mouth every 6 (six) hours as needed for up to 30 doses for mild pain or moderate pain. 06/04/20  Yes Trifan, Carola Rhine, MD  albuterol (  VENTOLIN HFA) 108 (90 Base) MCG/ACT inhaler Inhale 2 puffs into the lungs every 4 (four) hours as needed for wheezing or shortness of breath.  10/03/18  Yes [provider]  amLODipine (NORVASC) 5 MG tablet Take 1 tablet (5 mg total) by mouth daily. 12/27/20  Yes Charlesetta Shanks, MD  aspirin EC 81 MG tablet Take 81 mg by mouth in the morning and at bedtime. Swallow whole.   Yes [provider]  carbidopa-levodopa (SINEMET CR) 50-200 MG tablet Take 1 tablet by mouth at bedtime. 04/02/20  Yes Tat, Eustace Quail, DO  carbidopa-levodopa (SINEMET IR) 25-100 MG tablet Take 2 tablets at 7 AM, 2 tablets at 10 AM, 2 tablets at 2 PM and 1 tablet at 6 PM Patient taking differently: Take 1-2 tablets by mouth See admin instructions. Take 2 tablets at 7 AM, 2 tablets at 10 AM, 2 tablets at 2 PM and 1 tablet at 6 PM 07/08/20   Yes Tat, Wells Guiles S, DO  entacapone (COMTAN) 200 MG tablet TAKE 1 TABLET THREE TIMES DAILY AT 7AM, 10AM AND 2PM. Patient taking differently: Take 200 mg by mouth 3 (three) times daily. 7AM, 10AM AND 2PM. 10/29/19  Yes Tat, Eustace Quail, DO  furosemide (LASIX) 20 MG tablet Take 1 tablet (20 mg total) by mouth daily as needed for edema. 12/27/20  Yes Charlesetta Shanks, MD  ibuprofen (ADVIL,MOTRIN) 400 MG tablet Take 400 mg by mouth every 6 (six) hours as needed for headache or moderate pain.   Yes [provider]  meclizine (ANTIVERT) 25 MG tablet Take 25 mg by mouth 2 (two) times daily as needed for dizziness.  10/02/18  Yes [provider]  metFORMIN (GLUCOPHAGE) 500 MG tablet Take 500 mg by mouth daily with breakfast.    Yes [provider]  Pimavanserin Tartrate (NUPLAZID) 34 MG CAPS Take 1 capsule (34 mg total) by mouth daily. 07/02/20  Yes Tat, Eustace Quail, DO  tamsulosin (FLOMAX) 0.4 MG CAPS capsule Take 1 capsule (0.4 mg total) by mouth daily. 04/16/20  Yes Vanessa Kick, MD  traZODone (DESYREL) 50 MG tablet Take 50 mg by mouth at bedtime. 08/17/20  Yes [provider]  omeprazole (PRILOSEC) 20 MG capsule Take 1 capsule (20 mg total) by mouth daily. Patient not taking: Reported on 05/28/2021 12/27/20   Charlesetta Shanks, MD  Tiotropium Bromide-Olodaterol (STIOLTO RESPIMAT) 2.5-2.5 MCG/ACT AERS Inhale 2 puffs into the lungs daily. Patient not taking: Reported on 05/28/2021 04/24/19   Margaretha Seeds, MD  vitamin C (VITAMIN C) 500 MG tablet Take 1 tablet (500 mg total) by mouth daily. Patient not taking: Reported on 05/04/2021 04/11/19   Allie Bossier, MD    Allergies    Patient has no known allergies.  Review of Systems   Review of Systems  Unable to perform ROS: Mental status change   Physical Exam Updated Vital Signs BP 140/70    Pulse 71    Temp 98 F (36.7 C) (Oral)    Resp 16    SpO2 100%   Physical Exam Vitals and nursing note reviewed.   Constitutional:      General: He is not in acute distress.    Appearance: He is well-developed. He is not diaphoretic.  HENT:     Head: Normocephalic and atraumatic.  Eyes:     General: No scleral icterus.    Extraocular Movements: Extraocular movements intact.     Conjunctiva/sclera: Conjunctivae normal.     Pupils: Pupils are equal, round, and  reactive to light.  Cardiovascular:     Rate and Rhythm: Normal rate and regular rhythm.     Pulses:          Dorsalis pedis pulses are 1+ on the right side and 1+ on the left side.       Posterior tibial pulses are 1+ on the right side and 1+ on the left side.     Heart sounds: Normal heart sounds.  Pulmonary:     Effort: Pulmonary effort is normal. No respiratory distress.     Breath sounds: Normal breath sounds.  Abdominal:     Palpations: Abdomen is soft.     Tenderness: There is no abdominal tenderness.  Musculoskeletal:     Cervical back: Normal range of motion and neck supple.  Skin:    General: Skin is warm and dry.  Neurological:     Mental Status: He is alert. He is disoriented.     GCS: GCS eye subscore is 4. GCS verbal subscore is 4. GCS motor subscore is 6.     Motor: Weakness and tremor present.     Comments: No focal deficits  Psychiatric:        Behavior: Behavior normal.    ED Results / Procedures / Treatments   Labs (all labs ordered are listed, but only abnormal results are displayed) Labs Reviewed  COMPREHENSIVE METABOLIC PANEL - Abnormal; Notable for the following components:      Result Value   Glucose, Bld 102 (*)    AST 13 (*)    Total Bilirubin 2.3 (*)    All other components within normal limits  CBC WITH DIFFERENTIAL/PLATELET - Abnormal; Notable for the following components:   Hemoglobin 12.3 (*)    Platelets 135 (*)    All other components within normal limits  URINALYSIS, ROUTINE W REFLEX MICROSCOPIC - Abnormal; Notable for the following components:   Color, Urine AMBER (*)    APPearance HAZY  (*)    Ketones, ur 5 (*)    Leukocytes,Ua MODERATE (*)    Bacteria, UA RARE (*)    All other components within normal limits  CULTURE, BLOOD (ROUTINE X 2)  CULTURE, BLOOD (ROUTINE X 2)  RESP PANEL BY RT-PCR (FLU A&B, COVID) ARPGX2  LACTIC ACID, PLASMA  PROTIME-INR  BRAIN NATRIURETIC PEPTIDE  TROPONIN I (HIGH SENSITIVITY)  TROPONIN I (HIGH SENSITIVITY)    EKG EKG Interpretation  Date/Time:  Wednesday May 27 2021 23:17:57 EST Ventricular Rate:  70 PR Interval:  218 QRS Duration: 94 QT Interval:  380 QTC Calculation: 410 R Axis:   55 Text Interpretation: Sinus rhythm with 1st degree A-V block Otherwise normal ECG When compared with ECG of 05/04/2021, No significant change was found Confirmed by Delora Fuel ([id number]) on 05/27/2021 11:32:06 PM  Radiology DG Chest 2 View  Result Date: 05/28/2021 CLINICAL DATA:  Altered mental status with chest pain and shortness of breath. EXAM: CHEST - 2 VIEW COMPARISON:  May 03, 2021 FINDINGS: There is no evidence of acute infiltrate, pleural effusion or pneumothorax. Stable elevation of the left hemidiaphragm is seen. The heart size and mediastinal contours are within normal limits. Degenerative changes are noted throughout the thoracic spine. IMPRESSION: No active cardiopulmonary disease. Electronically Signed   By: Virgina Norfolk M.D.   On: 05/28/2021 00:00   CT HEAD WO CONTRAST (5MM)  Result Date: 05/28/2021 CLINICAL DATA:  Recent head trauma, initial encounter EXAM: CT HEAD WITHOUT CONTRAST TECHNIQUE: Contiguous axial images were obtained from the base of  the skull through the vertex without intravenous contrast. COMPARISON:  05/03/2021 FINDINGS: Brain: No evidence of acute infarction, hemorrhage, hydrocephalus, extra-axial collection or mass lesion/mass effect. Mild atrophic changes are noted. Vascular: No hyperdense vessel or unexpected calcification. Skull: Normal. Negative for fracture or focal lesion. Sinuses/Orbits: No acute  finding. Other: None. IMPRESSION: Atrophic changes without acute abnormality. Electronically Signed   By: Inez Catalina M.D.   On: 05/28/2021 00:17    Procedures Procedures   Medications Ordered in ED Medications - No data to display  ED Course  I have reviewed the triage vital signs and the nursing notes.  Pertinent labs & imaging results that were available during my care of the patient were reviewed by me and considered in my medical decision making (see chart for details).  Clinical Course as of 05/28/21 1440  Thu May 28, 2021  9811 I attempted to call Daughter, [name] to gather add'l hx, no answer. Left VM. [AH]    Clinical Course User Index [AH] Margarita Mail, PA-C   MDM Rules/Calculators/A&P                         CC:ams VS:  Vitals:   05/28/21 1000 05/28/21 1015 05/28/21 1030 05/28/21 1104  BP: 123/66 128/66 (!) 147/69 140/70  Pulse: 69 67 73 71  Resp: 15 (!) 25 (!) 23 16  Temp:      TempSrc:      SpO2: 100% 100% 100% 100%    BJ:YNWGNFA is gathered by [place] and daughter by phone. Previous records obtained and reviewed. DDX:The patient's complaint of  involves an extensive number of diagnostic and treatment options, and is a complaint that carries with it a high risk of complications, morbidity, and potential mortality. Given the large differential diagnosis, medical decision making is of high complexity. The differential diagnosis for AMS is extensive and includes, but is not limited to: drug overdose - opioids, alcohol, sedatives, antipsychotics, drug withdrawal, others; Metabolic: hypoxia, hypoglycemia, hyperglycemia, hypercalcemia, hypernatremia, hyponatremia, uremia, hepatic encephalopathy, hypothyroidism, hyperthyroidism, vitamin B12 or thiamine deficiency, carbon monoxide poisoning, Wilson's disease, Lactic acidosis, DKA/HHOS; Infectious: meningitis, encephalitis, bacteremia/sepsis, urinary tract infection, pneumonia, neurosyphilis; Structural: Space-occupying lesion,  (brain tumor, subdural hematoma, hydrocephalus,); Vascular: stroke, subarachnoid hemorrhage, coronary ischemia, hypertensive encephalopathy, CNS vasculitis, thrombotic thrombocytopenic purpura, disseminated intravascular coagulation, hyperviscosity; Psychiatric: Schizophrenia, depression; Other: Seizure, hypothermia, heat stroke, ICU psychosis, dementia -"sundowning."  Labs: I ordered reviewed and interpreted labs which include CBC shows mild anemia of insignificant value, CMP also without significant finding.  Lactic acid within normal limits, respiratory panel negative for COVID or influenza, urine appears contaminated, troponin is negative x2 Imaging: I ordered and reviewed images which included chest x-ray and CT head. I independently visualized and interpreted all imaging. There are no acute, significant findings on today's images. EKG: Sinus rhythm at a rate of 70 without significant abnormality Consults: MDM: Patient here with confusion, complaining of foot pain.  He has no signs of infection or trauma to the feet.  Cap refill less than 2 seconds, feet are warm and well-perfused.  No redness or swelling suggestive of gout or cellulitis.  He is ambulatory on his feet.  I discussed the patient's findings and his baseline mental status with his daughter who states that he appears to be at baseline.  She is comfortable taking him home and lives with him. Patient disposition:The patient appears reasonably screened and/or stabilized for discharge and I doubt any other medical condition or other [facility] requiring  further screening, evaluation, or treatment in the ED at this time prior to discharge. I have discussed lab and/or imaging findings with the patient and answered all questions/concerns to the best of my ability.I have discussed return precautions and OP follow up.    Final Clinical Impression(s) / ED Diagnoses Final diagnoses:  Chronic confusion  Foot pain, bilateral    Rx / DC Orders ED  Discharge Orders     None        Margarita Mail, PA-C 05/28/21 1442    Carmin Muskrat, MD 05/28/21 1511

## 2021-05-28 NOTE — ED Notes (Signed)
Spoke with [name], daughter who states that his mental capacity has been declining over the last year and this seems to be his normal. She reported that he has a hx of gout. She also states that the last time he came to the hospital he was found to have a UTI.

## 2021-06-01 LAB — CULTURE, BLOOD (ROUTINE X 2)
Culture: NO GROWTH
Culture: NO GROWTH
Special Requests: ADEQUATE
Special Requests: ADEQUATE

## 2021-08-30 ENCOUNTER — Other Ambulatory Visit: Payer: Self-pay

## 2021-08-30 ENCOUNTER — Emergency Department (HOSPITAL_COMMUNITY): Payer: Medicare HMO

## 2021-08-30 ENCOUNTER — Emergency Department (HOSPITAL_COMMUNITY)
Admission: EM | Admit: 2021-08-30 | Discharge: 2021-08-30 | Disposition: A | Discharge status: Home / Self Care | Payer: Medicare HMO | Attending: Emergency Medicine | Admitting: Emergency Medicine

## 2021-08-30 DIAGNOSIS — W19XXXA Unspecified fall, initial encounter: Secondary | ICD-10-CM

## 2021-08-30 DIAGNOSIS — W01198A Fall on same level from slipping, tripping and stumbling with subsequent striking against other object, initial encounter: Secondary | ICD-10-CM | POA: Insufficient documentation

## 2021-08-30 DIAGNOSIS — Z7982 Long term (current) use of aspirin: Secondary | ICD-10-CM | POA: Diagnosis not present

## 2021-08-30 DIAGNOSIS — G20 Parkinson's disease: Secondary | ICD-10-CM | POA: Diagnosis not present

## 2021-08-30 DIAGNOSIS — M542 Cervicalgia: Secondary | ICD-10-CM | POA: Insufficient documentation

## 2021-08-30 MED ORDER — ACETAMINOPHEN 325 MG PO TABS
650.0000 mg | ORAL_TABLET | Freq: Once | ORAL | Status: AC
  Administered 2021-08-30: 650 mg via ORAL
  Filled 2021-08-30: qty 2

## 2021-08-30 NOTE — Progress Notes (Signed)
Orthopedic Tech Progress Note ?Patient Details:  ?Theodore King ?[birth year] ?[id number] ? ?Ortho Devices ?Ortho Device/Splint Location: Hard collar ?Ortho Device/Splint Interventions: Application ?  ?Post Interventions ?Patient Tolerated: Well ?Instructions Provided: Care of device, Adjustment of device ? ?[name] ?08/30/2021, 5:17 PM ? ?

## 2021-08-30 NOTE — ED Provider Notes (Signed)
?[facility] DEPT ?Provider Note ? ? ?CSN: [id number] ?Arrival date & time: 08/30/21  1535 ? ?  ? ?History ? ?Chief Complaint  ?Patient presents with  ? Fall  ? ? ?Theodore King is a 76 y.o. male. ? ?Mechanical fall today.  History of Parkinson's.  Uses a walker.  States that he tripped and fell.  Did not lose consciousness.  Not on blood thinners.  Has no extremity pain.  Has been able to ambulate since the fall.  Did hit his head.  States may be some left-sided neck pain.  Fall happened yesterday. ? ?The history is provided by the patient.  ?Fall ?This is a new problem. The current episode started yesterday. The problem has been resolved. Pertinent negatives include no chest pain, no abdominal pain, no headaches and no shortness of breath. Nothing aggravates the symptoms. Nothing relieves the symptoms. He has tried nothing for the symptoms. The treatment provided no relief.  ? ?  ? ?Home Medications ?Prior to Admission medications   ?Medication Sig Start Date End Date Taking? Authorizing Provider  ?acetaminophen (TYLENOL) 325 MG tablet Take 2 tablets (650 mg total) by mouth every 6 (six) hours as needed for up to 30 doses for mild pain or moderate pain. 06/04/20   Wyvonnia Dusky, MD  ?albuterol (VENTOLIN HFA) 108 (90 Base) MCG/ACT inhaler Inhale 2 puffs into the lungs every 4 (four) hours as needed for wheezing or shortness of breath.  10/03/18   [provider]  ?amLODipine (NORVASC) 5 MG tablet Take 1 tablet (5 mg total) by mouth daily. 12/27/20   Charlesetta Shanks, MD  ?aspirin EC 81 MG tablet Take 81 mg by mouth in the morning and at bedtime. Swallow whole.    [provider]  ?carbidopa-levodopa (SINEMET CR) 50-200 MG tablet Take 1 tablet by mouth at bedtime. 04/02/20   Tat, Eustace Quail, DO  ?carbidopa-levodopa (SINEMET IR) 25-100 MG tablet Take 2 tablets at 7 AM, 2 tablets at 10 AM, 2 tablets at 2 PM and 1 tablet at 6 PM ?Patient taking differently: Take 1-2  tablets by mouth See admin instructions. Take 2 tablets at 7 AM, 2 tablets at 10 AM, 2 tablets at 2 PM and 1 tablet at 6 PM 07/08/20   Tat, Rebecca S, DO  ?entacapone (COMTAN) 200 MG tablet TAKE 1 TABLET THREE TIMES DAILY AT 7AM, 10AM AND 2PM. ?Patient taking differently: Take 200 mg by mouth 3 (three) times daily. 7AM, 10AM AND 2PM. 10/29/19   Tat, Eustace Quail, DO  ?furosemide (LASIX) 20 MG tablet Take 1 tablet (20 mg total) by mouth daily as needed for edema. 12/27/20   Charlesetta Shanks, MD  ?ibuprofen (ADVIL,MOTRIN) 400 MG tablet Take 400 mg by mouth every 6 (six) hours as needed for headache or moderate pain.    [provider]  ?meclizine (ANTIVERT) 25 MG tablet Take 25 mg by mouth 2 (two) times daily as needed for dizziness.  10/02/18   [provider]  ?metFORMIN (GLUCOPHAGE) 500 MG tablet Take 500 mg by mouth daily with breakfast.     [provider]  ?omeprazole (PRILOSEC) 20 MG capsule Take 1 capsule (20 mg total) by mouth daily. ?Patient not taking: Reported on 05/28/2021 12/27/20   Charlesetta Shanks, MD  ?Pimavanserin Tartrate (NUPLAZID) 34 MG CAPS Take 1 capsule (34 mg total) by mouth daily. 07/02/20   TatEustace Quail, DO  ?tamsulosin (FLOMAX) 0.4 MG CAPS capsule Take 1 capsule (0.4 mg total) by  mouth daily. 04/16/20   Vanessa Kick, MD  ?Tiotropium Bromide-Olodaterol (STIOLTO RESPIMAT) 2.5-2.5 MCG/ACT AERS Inhale 2 puffs into the lungs daily. ?Patient not taking: Reported on 05/28/2021 04/24/19   Margaretha Seeds, MD  ?traZODone (DESYREL) 50 MG tablet Take 50 mg by mouth at bedtime. 08/17/20   [provider]  ?vitamin C (VITAMIN C) 500 MG tablet Take 1 tablet (500 mg total) by mouth daily. ?Patient not taking: Reported on 05/04/2021 04/11/19   Allie Bossier, MD  ?   ? ?Allergies    ?Patient has no known allergies.   ? ?Review of Systems   ?Review of Systems  ?Respiratory:  Negative for shortness of breath.   ?Cardiovascular:  Negative for chest pain.  ?Gastrointestinal:   Negative for abdominal pain.  ?Neurological:  Negative for headaches.  ? ?Physical Exam ?Updated Vital Signs ?BP (!) 123/50 (BP Location: Right Arm)   Pulse 65   Temp 97.8 ?F (36.6 ?C) (Oral)   Resp 18   SpO2 96%  ?Physical Exam ?Vitals and nursing note reviewed.  ?Constitutional:   ?   General: He is not in acute distress. ?   Appearance: He is well-developed. He is not ill-appearing.  ?HENT:  ?   Head: Normocephalic and atraumatic.  ?   Nose: Nose normal.  ?   Mouth/Throat:  ?   Mouth: Mucous membranes are moist.  ?Eyes:  ?   Extraocular Movements: Extraocular movements intact.  ?   Conjunctiva/sclera: Conjunctivae normal.  ?   Pupils: Pupils are equal, round, and reactive to light.  ?Cardiovascular:  ?   Rate and Rhythm: Normal rate and regular rhythm.  ?   Pulses: Normal pulses.  ?   Heart sounds: Normal heart sounds. No murmur heard. ?Pulmonary:  ?   Effort: Pulmonary effort is normal. No respiratory distress.  ?   Breath sounds: Normal breath sounds.  ?Abdominal:  ?   General: Abdomen is flat.  ?   Palpations: Abdomen is soft.  ?   Tenderness: There is no abdominal tenderness.  ?Musculoskeletal:     ?   General: No swelling or tenderness.  ?   Cervical back: Normal range of motion and neck supple.  ?   Comments: No midline spinal tenderness, some tenderness to the paraspinal cervical muscles on the left  ?Skin: ?   General: Skin is warm and dry.  ?   Capillary Refill: Capillary refill takes less than 2 seconds.  ?Neurological:  ?   General: No focal deficit present.  ?   Mental Status: He is alert and oriented to person, place, and time.  ?   Cranial Nerves: No cranial nerve deficit.  ?   Sensory: No sensory deficit.  ?   Motor: No weakness.  ?   Coordination: Coordination normal.  ?   Comments: 5+ out of 5 strength throughout, normal sensation, no drift, normal speech, no visual changes  ?Psychiatric:     ?   Mood and Affect: Mood normal.  ? ? ?ED Results / Procedures / Treatments   ?Labs ?(all labs  ordered are listed, but only abnormal results are displayed) ?Labs Reviewed - No data to display ? ?EKG ?None ? ?Radiology ?CT Head Wo Contrast ? ?Result Date: 08/30/2021 ?CLINICAL DATA:  Fall, head and neck trauma EXAM: CT HEAD WITHOUT CONTRAST CT CERVICAL SPINE WITHOUT CONTRAST TECHNIQUE: Multidetector CT imaging of the head and cervical spine was performed following the standard protocol without intravenous contrast. Multiplanar CT image reconstructions  of the cervical spine were also generated. RADIATION DOSE REDUCTION: This exam was performed according to the departmental dose-optimization program which includes automated exposure control, adjustment of the mA and/or kV according to patient size and/or use of iterative reconstruction technique. COMPARISON:  05/28/2021 FINDINGS: CT HEAD FINDINGS Brain: No evidence of acute infarction, hemorrhage, hydrocephalus, extra-axial collection or mass lesion/mass effect. Periventricular white matter hypodensity. Vascular: No hyperdense vessel or unexpected calcification. Skull: Normal. Negative for fracture or focal lesion. Sinuses/Orbits: No acute finding. Other: None. CT CERVICAL SPINE FINDINGS Alignment: Normal. Skull base and vertebrae: No acute fracture. No primary bone lesion or focal pathologic process. Soft tissues and spinal canal: No prevertebral fluid or swelling. No visible canal hematoma. Disc levels: Moderate to severe multilevel disc space height loss and osteophytosis throughout, with sizable anterior bridging osteophytes. Upper chest: Negative. Other: None. IMPRESSION: 1. No acute intracranial pathology. Small-vessel white matter disease. 2. No fracture or static subluxation of the cervical spine. 3. 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 Electronically Signed   By: Delanna Ahmadi M.D.   On: 08/30/2021 17:38  ? ?CT Cervical Spine Wo Contrast ? ?Result Date: 08/30/2021 ?CLINICAL DATA:  Fall, head and neck trauma EXAM: CT HEAD  WITHOUT CONTRAST CT CERVICAL SPINE WITHOUT CONTRAST TECHNIQUE: Multidetector CT imaging of the head and cervical spine was performed following the standard protocol without intravenous contrast. Multiplanar CT image reconstr

## 2021-08-30 NOTE — Discharge Instructions (Signed)
No injuries found on CT scan today.  Overall recommend 1000 mg of Tylenol every 6 hours as needed for pain.  Consider purchasing over-the-counter lidocaine patches if needed.  Follow-up with your primary care doctor. ?

## 2021-08-30 NOTE — ED Provider Triage Note (Signed)
Emergency Medicine Provider Triage Evaluation Note ? ?Theodore King , a 76 y.o. male  was evaluated in triage.  Pt complains of fall last night after his knee gave out on him. States he hit his head. Denies loc. Has some neck pain ? ?Review of Systems  ?Positive: Head injury, neck pain ?Negative: loc ? ?Physical Exam  ?BP (!) 123/50 (BP Location: Right Arm)   Pulse 65   Temp 97.8 ?F (36.6 ?C) (Oral)   Resp 18   SpO2 96%  ?Gen:   Awake, no distress   ?Resp:  Normal effort  ?MSK:   Moves extremities without difficulty  ?Other:  Clear speech ? ?Medical Decision Making  ?Medically screening exam initiated at 4:43 PM.  Appropriate orders placed.  Theodore King was informed that the remainder of the evaluation will be completed by another provider, this initial triage assessment does not replace that evaluation, and the importance of remaining in the ED until their evaluation is complete. ? ? ?  ?Rodney Booze, PA-C ?08/30/21 1645 ? ?

## 2021-08-30 NOTE — ED Triage Notes (Signed)
Pt states he was fixing a bowl of cereal and fell today. Hx of parkinson's  Uses a walker.  Struck his left head and left knee.  Pt denies use of blood thinners.  ?

## 2021-09-24 ENCOUNTER — Ambulatory Visit (INDEPENDENT_AMBULATORY_CARE_PROVIDER_SITE_OTHER): Payer: Medicare HMO | Admitting: Diagnostic Neuroimaging

## 2021-09-24 ENCOUNTER — Encounter: Payer: Self-pay | Admitting: Diagnostic Neuroimaging

## 2021-09-24 VITALS — BP 79/49 | HR 64 | Ht 71.5 in | Wt 174.6 lb

## 2021-09-24 DIAGNOSIS — G20 Parkinson's disease: Secondary | ICD-10-CM | POA: Diagnosis not present

## 2021-09-24 DIAGNOSIS — F0280 Dementia in other diseases classified elsewhere without behavioral disturbance: Secondary | ICD-10-CM | POA: Diagnosis not present

## 2021-09-24 MED ORDER — CARBIDOPA-LEVODOPA 25-100 MG PO TABS: ORAL_TABLET | ORAL | 12 refills | Status: AC

## 2021-09-24 MED ORDER — CARBIDOPA-LEVODOPA ER 50-200 MG PO TBCR: 1.0000 | EXTENDED_RELEASE_TABLET | Freq: Every day | ORAL | 4 refills | Status: DC

## 2021-09-24 MED ORDER — SERTRALINE HCL 25 MG PO TABS: 25.0000 mg | ORAL_TABLET | Freq: Every day | ORAL | 6 refills | Status: DC

## 2021-09-24 MED ORDER — GLYCOPYRROLATE 1 MG PO TABS: 1.0000 mg | ORAL_TABLET | Freq: Two times a day (BID) | ORAL | 6 refills | Status: AC

## 2021-09-24 NOTE — Progress Notes (Addendum)
? ? ?PATIENT: Theodore King ?DOB: 07-Sep-1945 ? ? ?REASON FOR VISIT: follow up ?HISTORY FROM: patient and GF Romie Minus) ? ?Chief Complaint  ?Patient presents with  ? Parkinson's disease  ?  Rm 7, est pt ,last seen 2018  dgtr- [name]  ?  ?HISTORY OF PRESENT ILLNESS: ? ?UPDATE (09/24/21, VRP): Since last visit, was seeing Dr. Carles Collet for several years. Now back to our clinic. Has had significant progression and decline. Decreased motivation. Less movement. Multiple ER visits in the last few months. More dizziness, lightheaded. ? ?UPDATE 12/03/16: Since last visit, had progressive decline in walking, increased back pain, increased right leg pain. Had multiple ER visits (May 2018), then admitted, then went to rehab ([place] x 8 days). Also had run out of rasagiline in April 2018. Also had missed appts here due to financial limitations.  ? ?UPDATE 08/18/15: Since last visit, has had decline in movement and walking in last 1 month. More bradykinesia, gait diff and tremors. Meds not working as well as before. More back pain.  ? ?UPDATE 02/17/15: RBD symptoms worsening; has stopped clonazepam because it is not working. Left groin pull is better. PD overall stable. No wearing off. Tremor stable. No falls. ? ?UPDATE 08/13/14: Since last visit, doing well from PD standpoint. Tremor and gait stable. Some new left groin pulling sensation x 3 weeks, without reported injury. Also pain/weakness in legs with standing for long time. ? ?UPDATE 01/29/14: Since last visit, doing well. Clonazepam is helping his RBD symptoms, but he would like to to increase from '1mg'$  qhs up to 1.'5mg'$  qhs. Tremor is stable. Tolerating carb/levo and pramipexole. ? ?UPDATE 07/06/13 (LL):  Patient's gf requesting sooner appt than 11/05/13, which is yearly f/u. Last OV was 10/26/12. Patient's girlfriend states that patient is having sleeping problems, moving and talking in his sleep.  This is causing patient concern, he is worried that he may accidentally hurt girlfriend  during sleep.  He would like treatment. Otherwise no changes, minor tremor in hand, not bothersome. Tolerating medications well. ? ?UPDATE 10/26/12: Patient is doing extremity well since last visit. Increased carbidopa/levodopa dosing has essentially removed his tremors. He very rarely feels tremor in his left arm. Patient is competed in the senior games over past few months and won [employer] (including several gold). He won the [employer] after competing for the first time in his life. 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 Overall doing great.  ? ?UPDATE 05/10/12: Feels just a little better, continues with mild tremors. Denies any falls. He continues to work out 3-4 times per week. His only complaint today is he continues to feel embarassed with his tremors.  ? ?UPDATE 03/06/12: Feels like doing better with the Pramipixole. Continues to have mild tremor. Denies any falls. Works out 3-4 days per week. He does feel that his tremors can improve more to help him to not feel embarrased.  ? ?UPDATE 01/04/12: Couldn't afford meds in end of june and for last 3 weeks of June was taking less meds than rx'd. Tremor worsened. Now has insurance and getting better tremor control, but still present. Still with int drooling. No constipation. No wearing off. No dyskinesias. No constipation.  ? ?UPDATE 10/27/11: Doing about the same. Not much benefit with carb/levo. No side effects. Still with tremor (rest and action). No falls. Works out 3x per week. Recent gold medal at [employer].  ? ?PRIOR HPI:  76 year old left-handed male with no past medical history, here for evaluation of tremor. Since November 2012, patient has developed progressive tremor in his left hand greater than right hand. He also feels slower with fine finger movements and coordination. His girlfriend is noticed short shuffling steps with walking. Symptoms have been progressive over last few  months. Patient had an injury in 2012 with rotator cuff tear. He thinks that since that time his symptoms have been worse. He denies change in smell or taste, vivid dreams, hallucinations, constipation or swallowing difficulty.  ? ? ?REVIEW OF SYSTEMS: Full 14 system review of systems performed and negative except: freq urination speech diff weakness restless legs joint pain speech diff weakness snoring back pain.  ? ? ?ALLERGIES: ?No Known Allergies ? ?HOME MEDICATIONS: ?Outpatient Medications Prior to Visit  ?Medication Sig Dispense Refill  ? albuterol (VENTOLIN HFA) 108 (90 Base) MCG/ACT inhaler Inhale 2 puffs into the lungs every 4 (four) hours as needed for wheezing or shortness of breath.     ? ibuprofen (ADVIL,MOTRIN) 400 MG tablet Take 400 mg by mouth every 6 (six) hours as needed for headache or moderate pain.    ? tamsulosin (FLOMAX) 0.4 MG CAPS capsule Take 1 capsule (0.4 mg total) by mouth daily. 30 capsule 1  ? traZODone (DESYREL) 50 MG tablet Take 50 mg by mouth at bedtime.    ? aspirin EC 81 MG tablet Take 81 mg by mouth in the morning and at bedtime. Swallow whole.    ? carbidopa-levodopa (SINEMET CR) 50-200 MG tablet Take 1 tablet by mouth at bedtime. 90 tablet 1  ? carbidopa-levodopa (SINEMET IR) 25-100 MG tablet Take 2 tablets at 7 AM, 2 tablets at 10 AM, 2 tablets at 2 PM and 1 tablet at 6 PM 210 tablet 2  ? acetaminophen (TYLENOL) 325 MG tablet Take 2 tablets (650 mg total) by mouth every 6 (six) hours as needed for up to 30 doses for mild pain or moderate pain. 30 tablet 0  ? amLODipine (NORVASC) 5 MG tablet Take 1 tablet (5 mg total) by mouth daily. (Patient not taking: Reported on 09/24/2021) 30 tablet 0  ? entacapone (COMTAN) 200 MG tablet TAKE 1 TABLET THREE TIMES DAILY AT 7AM, 10AM AND 2PM. (Patient not taking: Reported on 09/24/2021) 270 tablet 0  ? furosemide (LASIX) 20 MG tablet Take 1 tablet (20 mg total) by mouth daily as needed for edema. (Patient not taking: Reported on 09/24/2021) 15  tablet 0  ? meclizine (ANTIVERT) 25 MG tablet Take 25 mg by mouth 2 (two) times daily as needed for dizziness.     ? metFORMIN (GLUCOPHAGE) 500 MG tablet Take 500 mg by mouth daily with breakfast.  (Patient not taking: Reported on 09/24/2021)    ? omeprazole (PRILOSEC) 20 MG capsule Take 1 capsule (20 mg total) by mouth daily. (Patient not taking: Reported on 05/28/2021) 30 capsule 0  ? Pimavanserin Tartrate (NUPLAZID) 34 MG CAPS Take 1 capsule (34 mg total) by mouth daily. (Patient not taking: Reported on 09/24/2021) 30 capsule 3  ? Tiotropium Bromide-Olodaterol (STIOLTO RESPIMAT) 2.5-2.5 MCG/ACT AERS Inhale 2 puffs into the lungs daily. (Patient not taking: Reported on 05/28/2021) 4 g 6  ? vitamin C (VITAMIN C) 500 MG tablet Take 1 tablet (500 mg total) by mouth daily. (Patient not taking: Reported on 05/04/2021) 30 tablet 0  ? ?No facility-administered medications prior to visit.  ? ? ?PAST MEDICAL HISTORY: ?Past Medical History:  ?Diagnosis Date  ? Abnormal dreams 09/06/2013  ?  Disorders of bursae and tendons in shoulder region, unspecified   ? Hyperglycemia 05/18/2013  ? Hyperlipidemia LDL goal < 100 05/18/2013  ? Other and unspecified hyperlipidemia   ? Other and unspecified hyperlipidemia   ? 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 Paralysis agitans ([place]) 05/18/2013  ? Parkinson's disease ([place])   ? Pneumonia due to COVID-19 virus 04/08/2019  ? 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 SOB (shortness of breath) 03/23/2018  ? Unspecified vitamin D deficiency   ? Unspecified vitamin D deficiency   ? Vertigo, benign paroxysmal 11/05/2016  ? Vitamin D deficiency 05/18/2013  ? ? ?PAST SURGICAL HISTORY: ?Past Surgical History:  ?Procedure Laterality Date  ? APPENDECTOMY    ? ROTATOR CUFF REPAIR    ? right  ? ? ?FAMILY HISTORY: ?Family History  ?Problem Relation Age of Onset  ? Cancer Mother   ? Cancer Father   ? Stroke Brother   ? Cancer Sister   ? Healthy Daughter   ? Cancer Sister   ? Lung cancer Brother   ?  Healthy Daughter   ? Colon cancer Neg Hx   ? Esophageal cancer Neg Hx   ? Rectal cancer Neg Hx   ? Stomach cancer Neg Hx   ? ? ?SOCIAL HISTORY: ?Social History  ? ?Socioeconomic History  ? Marital status: S

## 2021-09-24 NOTE — Patient Instructions (Addendum)
ADVANCED PARKINSON'S DISEASE (established problem, worsening) ?- continue carbidopa/levodopa 25/'100mg'$  (Take 2 tablets at 7 AM, 2 tablets at 10 AM, 2 tablets at 2 PM and 1 tablet at 6 PM) ? ?- carb/levo 50/200  ER --> 1 tab at bedtime ? ?- [facility] to [facility] [place] ? ?- trial of sertraline '25mg'$  daily for depression ? ?- start glycopyrrolate '1mg'$  twice a day for sialorrhea ?

## 2021-10-23 ENCOUNTER — Emergency Department (HOSPITAL_COMMUNITY): Payer: Medicare HMO

## 2021-10-23 ENCOUNTER — Inpatient Hospital Stay (HOSPITAL_COMMUNITY)
Admission: EM | Admit: 2021-10-23 | Discharge: 2021-10-27 | DRG: 683 | Disposition: A | Discharge status: Home Health | Payer: Medicare HMO | Attending: Internal Medicine | Admitting: Internal Medicine

## 2021-10-23 ENCOUNTER — Other Ambulatory Visit: Payer: Self-pay

## 2021-10-23 ENCOUNTER — Encounter (HOSPITAL_COMMUNITY): Payer: Self-pay | Admitting: Internal Medicine

## 2021-10-23 DIAGNOSIS — R531 Weakness: Secondary | ICD-10-CM | POA: Diagnosis present

## 2021-10-23 DIAGNOSIS — Z8616 Personal history of COVID-19: Secondary | ICD-10-CM | POA: Diagnosis not present

## 2021-10-23 DIAGNOSIS — G629 Polyneuropathy, unspecified: Secondary | ICD-10-CM | POA: Diagnosis present

## 2021-10-23 DIAGNOSIS — D696 Thrombocytopenia, unspecified: Secondary | ICD-10-CM | POA: Diagnosis present

## 2021-10-23 DIAGNOSIS — N401 Enlarged prostate with lower urinary tract symptoms: Secondary | ICD-10-CM | POA: Diagnosis present

## 2021-10-23 DIAGNOSIS — R823 Hemoglobinuria: Secondary | ICD-10-CM | POA: Diagnosis present

## 2021-10-23 DIAGNOSIS — N320 Bladder-neck obstruction: Secondary | ICD-10-CM | POA: Diagnosis present

## 2021-10-23 DIAGNOSIS — Z823 Family history of stroke: Secondary | ICD-10-CM

## 2021-10-23 DIAGNOSIS — Z9181 History of falling: Secondary | ICD-10-CM | POA: Diagnosis not present

## 2021-10-23 DIAGNOSIS — N179 Acute kidney failure, unspecified: Principal | ICD-10-CM | POA: Diagnosis present

## 2021-10-23 DIAGNOSIS — I700 Atherosclerosis of aorta: Secondary | ICD-10-CM | POA: Diagnosis present

## 2021-10-23 DIAGNOSIS — Z801 Family history of malignant neoplasm of trachea, bronchus and lung: Secondary | ICD-10-CM

## 2021-10-23 DIAGNOSIS — G252 Other specified forms of tremor: Secondary | ICD-10-CM | POA: Diagnosis present

## 2021-10-23 DIAGNOSIS — R933 Abnormal findings on diagnostic imaging of other parts of digestive tract: Secondary | ICD-10-CM | POA: Diagnosis present

## 2021-10-23 DIAGNOSIS — N400 Enlarged prostate without lower urinary tract symptoms: Secondary | ICD-10-CM

## 2021-10-23 DIAGNOSIS — K6389 Other specified diseases of intestine: Secondary | ICD-10-CM | POA: Diagnosis not present

## 2021-10-23 DIAGNOSIS — R296 Repeated falls: Secondary | ICD-10-CM | POA: Diagnosis present

## 2021-10-23 DIAGNOSIS — K56600 Partial intestinal obstruction, unspecified as to cause: Secondary | ICD-10-CM | POA: Diagnosis present

## 2021-10-23 DIAGNOSIS — G20 Parkinson's disease: Secondary | ICD-10-CM | POA: Diagnosis present

## 2021-10-23 DIAGNOSIS — R824 Acetonuria: Secondary | ICD-10-CM | POA: Diagnosis present

## 2021-10-23 DIAGNOSIS — R197 Diarrhea, unspecified: Secondary | ICD-10-CM | POA: Diagnosis present

## 2021-10-23 DIAGNOSIS — E785 Hyperlipidemia, unspecified: Secondary | ICD-10-CM | POA: Diagnosis present

## 2021-10-23 DIAGNOSIS — K5730 Diverticulosis of large intestine without perforation or abscess without bleeding: Secondary | ICD-10-CM | POA: Diagnosis present

## 2021-10-23 DIAGNOSIS — D123 Benign neoplasm of transverse colon: Secondary | ICD-10-CM | POA: Diagnosis present

## 2021-10-23 DIAGNOSIS — Z87891 Personal history of nicotine dependence: Secondary | ICD-10-CM

## 2021-10-23 DIAGNOSIS — R338 Other retention of urine: Secondary | ICD-10-CM | POA: Diagnosis present

## 2021-10-23 DIAGNOSIS — R935 Abnormal findings on diagnostic imaging of other abdominal regions, including retroperitoneum: Secondary | ICD-10-CM

## 2021-10-23 DIAGNOSIS — G20A1 Parkinson's disease without dyskinesia, without mention of fluctuations: Secondary | ICD-10-CM | POA: Diagnosis present

## 2021-10-23 DIAGNOSIS — K635 Polyp of colon: Secondary | ICD-10-CM | POA: Diagnosis not present

## 2021-10-23 DIAGNOSIS — K56609 Unspecified intestinal obstruction, unspecified as to partial versus complete obstruction: Secondary | ICD-10-CM | POA: Diagnosis not present

## 2021-10-23 DIAGNOSIS — Z79899 Other long term (current) drug therapy: Secondary | ICD-10-CM

## 2021-10-23 DIAGNOSIS — J449 Chronic obstructive pulmonary disease, unspecified: Secondary | ICD-10-CM | POA: Diagnosis not present

## 2021-10-23 HISTORY — DX: Atherosclerosis of aorta: I70.0

## 2021-10-23 HISTORY — DX: Benign prostatic hyperplasia without lower urinary tract symptoms: N40.0

## 2021-10-23 LAB — URINALYSIS, ROUTINE W REFLEX MICROSCOPIC
Bacteria, UA: NONE SEEN
Bilirubin Urine: NEGATIVE
Glucose, UA: NEGATIVE mg/dL
Ketones, ur: 5 mg/dL — AB
Leukocytes,Ua: NEGATIVE
Nitrite: NEGATIVE
Protein, ur: NEGATIVE mg/dL
Specific Gravity, Urine: 1.018 (ref 1.005–1.030)
pH: 5 (ref 5.0–8.0)

## 2021-10-23 LAB — CBC WITH DIFFERENTIAL/PLATELET
Abs Immature Granulocytes: 0.03 10*3/uL (ref 0.00–0.07)
Basophils Absolute: 0 10*3/uL (ref 0.0–0.1)
Basophils Relative: 0 %
Eosinophils Absolute: 0 10*3/uL (ref 0.0–0.5)
Eosinophils Relative: 0 %
HCT: 41.3 % (ref 39.0–52.0)
Hemoglobin: 13.5 g/dL (ref 13.0–17.0)
Immature Granulocytes: 0 %
Lymphocytes Relative: 12 %
Lymphs Abs: 1.3 10*3/uL (ref 0.7–4.0)
MCH: 28.2 pg (ref 26.0–34.0)
MCHC: 32.7 g/dL (ref 30.0–36.0)
MCV: 86.2 fL (ref 80.0–100.0)
Monocytes Absolute: 0.7 10*3/uL (ref 0.1–1.0)
Monocytes Relative: 6 %
Neutro Abs: 9.3 10*3/uL — ABNORMAL HIGH (ref 1.7–7.7)
Neutrophils Relative %: 82 %
Platelets: 136 10*3/uL — ABNORMAL LOW (ref 150–400)
RBC: 4.79 MIL/uL (ref 4.22–5.81)
RDW: 13.9 % (ref 11.5–15.5)
WBC: 11.5 10*3/uL — ABNORMAL HIGH (ref 4.0–10.5)
nRBC: 0 % (ref 0.0–0.2)

## 2021-10-23 LAB — COMPREHENSIVE METABOLIC PANEL
ALT: 7 U/L (ref 0–44)
AST: 17 U/L (ref 15–41)
Albumin: 4.3 g/dL (ref 3.5–5.0)
Alkaline Phosphatase: 61 U/L (ref 38–126)
Anion gap: 10 (ref 5–15)
BUN: 30 mg/dL — ABNORMAL HIGH (ref 8–23)
CO2: 24 mmol/L (ref 22–32)
Calcium: 9.5 mg/dL (ref 8.9–10.3)
Chloride: 108 mmol/L (ref 98–111)
Creatinine, Ser: 1.63 mg/dL — ABNORMAL HIGH (ref 0.61–1.24)
GFR, Estimated: 43 mL/min — ABNORMAL LOW (ref >60)
Glucose, Bld: 139 mg/dL — ABNORMAL HIGH (ref 70–99)
Potassium: 4.4 mmol/L (ref 3.5–5.1)
Sodium: 142 mmol/L (ref 135–145)
Total Bilirubin: 3.1 mg/dL — ABNORMAL HIGH (ref 0.3–1.2)
Total Protein: 7.8 g/dL (ref 6.5–8.1)

## 2021-10-23 LAB — LIPASE, BLOOD: Lipase: 28 U/L (ref 11–51)

## 2021-10-23 MED ORDER — TRAZODONE HCL 100 MG PO TABS
50.0000 mg | ORAL_TABLET | Freq: Every day | ORAL | Status: DC
  Administered 2021-10-23: 50 mg via ORAL
  Filled 2021-10-23: qty 0.5

## 2021-10-23 MED ORDER — CARBIDOPA-LEVODOPA 25-100 MG PO TABS
1.0000 | ORAL_TABLET | Freq: Every day | ORAL | Status: DC
  Administered 2021-10-23: 1 via ORAL
  Filled 2021-10-23: qty 1

## 2021-10-23 MED ORDER — ONDANSETRON HCL 4 MG/2ML IJ SOLN
4.0000 mg | Freq: Once | INTRAMUSCULAR | Status: AC
Start: 2021-10-23 — End: 2021-10-23
  Administered 2021-10-23: 4 mg via INTRAVENOUS
  Filled 2021-10-23: qty 2

## 2021-10-23 MED ORDER — SODIUM CHLORIDE 0.9 % IV SOLN
1000.0000 mL | INTRAVENOUS | Status: DC
  Administered 2021-10-23 – 2021-10-25 (×3): 1000 mL via INTRAVENOUS

## 2021-10-23 MED ORDER — BOOST / RESOURCE BREEZE PO LIQD CUSTOM
1.0000 | Freq: Three times a day (TID) | ORAL | Status: DC
Start: 2021-10-23 — End: 2021-10-27
  Administered 2021-10-23 – 2021-10-27 (×7): 1 via ORAL

## 2021-10-23 MED ORDER — CARBIDOPA-LEVODOPA ER 50-200 MG PO TBCR
1.0000 | EXTENDED_RELEASE_TABLET | Freq: Every day | ORAL | Status: DC
  Administered 2021-10-23 – 2021-10-26 (×3): 1 via ORAL
  Filled 2021-10-23 (×5): qty 1

## 2021-10-23 MED ORDER — ACETAMINOPHEN 325 MG PO TABS
650.0000 mg | ORAL_TABLET | Freq: Four times a day (QID) | ORAL | Status: DC | PRN
  Administered 2021-10-26: 650 mg via ORAL
  Filled 2021-10-23: qty 2

## 2021-10-23 MED ORDER — SODIUM CHLORIDE 0.9 % IV BOLUS (SEPSIS)
1000.0000 mL | Freq: Once | INTRAVENOUS | Status: AC
  Administered 2021-10-23: 1000 mL via INTRAVENOUS

## 2021-10-23 MED ORDER — TAMSULOSIN HCL 0.4 MG PO CAPS
0.4000 mg | ORAL_CAPSULE | Freq: Every day | ORAL | Status: DC
  Administered 2021-10-24 – 2021-10-27 (×4): 0.4 mg via ORAL
  Filled 2021-10-23 (×4): qty 1

## 2021-10-23 MED ORDER — MORPHINE SULFATE (PF) 4 MG/ML IV SOLN: 4.0000 mg | INTRAVENOUS | Status: DC | PRN

## 2021-10-23 MED ORDER — ACETAMINOPHEN 650 MG RE SUPP: 650.0000 mg | Freq: Four times a day (QID) | RECTAL | Status: DC | PRN

## 2021-10-23 MED ORDER — SERTRALINE HCL 50 MG PO TABS
25.0000 mg | ORAL_TABLET | Freq: Every day | ORAL | Status: DC
  Administered 2021-10-24: 25 mg via ORAL
  Filled 2021-10-23: qty 1

## 2021-10-23 MED ORDER — MORPHINE SULFATE (PF) 4 MG/ML IV SOLN
4.0000 mg | Freq: Once | INTRAVENOUS | Status: AC
  Administered 2021-10-23: 4 mg via INTRAVENOUS
  Filled 2021-10-23: qty 1

## 2021-10-23 MED ORDER — ONDANSETRON HCL 4 MG/2ML IJ SOLN: 4.0000 mg | Freq: Four times a day (QID) | INTRAMUSCULAR | Status: DC | PRN

## 2021-10-23 MED ORDER — GLYCOPYRROLATE 1 MG PO TABS
1.0000 mg | ORAL_TABLET | Freq: Two times a day (BID) | ORAL | Status: DC
  Administered 2021-10-23 – 2021-10-24 (×2): 1 mg via ORAL
  Filled 2021-10-23 (×2): qty 1

## 2021-10-23 MED ORDER — CARBIDOPA-LEVODOPA 25-100 MG PO TABS
2.0000 | ORAL_TABLET | Freq: Three times a day (TID) | ORAL | Status: DC
  Administered 2021-10-24 (×2): 2 via ORAL
  Filled 2021-10-23 (×2): qty 2

## 2021-10-23 MED ORDER — SODIUM CHLORIDE 0.9 % IV SOLN: 1000.0000 mL | INTRAVENOUS | Status: DC

## 2021-10-23 NOTE — ED Notes (Signed)
Daughter of patient Theodore King would like to speak to the nurse to find out why he is getting admitted.

## 2021-10-23 NOTE — ED Notes (Signed)
Pt encouraged to provide urine specimen per MD order.  

## 2021-10-23 NOTE — H&P (Signed)
History and Physical    Patient: Theodore King RAX:[id number] DOB: 09-Jan-1946 DOA: 10/23/2021 DOS: the patient was seen and examined on 10/23/2021 PCP: [name], MD  Patient coming from: Home  Chief Complaint:  Chief Complaint  Patient presents with   Abdominal Pain   Fall   Diarrhea   HPI: Theodore King is a 76 y.o. male with medical history significant of REM sleep behavior disorder, shoulder bursitis, hyperglycemia, hyperlipidemia, toxic neuropathy, Parkinson's disease, COVID-19 pneumonia, vitamin D deficiency, benign paroxysmal vertigo who is brought to the emergency department due to abdominal pain, nausea, vomiting and diarrhea for the past 4 to 5 days associated with generalized weakness with history of multiple falls who was brought today to the emergency department via EMS due to his worsening usual generalized weakness.  No flank pain, dysuria, frequency or hematuria.  No fever, chills, headache, sore throat, chest pain, productive cough, wheezing, hemoptysis, dyspnea, palpitations, orthopnea, PND or lower extremity edema.  ED course: Initial vital signs were temperature 97.5 F, pulse 78, respirations 16, BP 126/63 mmHg and O2 sat 99% on room air.  The patient received 2000 mL of NS bolus, morphine 4 mg IVP and ondansetron 4 mg IVP in the emergency department.  Lab work: Urinalysis showed small hemoglobinuria and ketonuria 5 mg/dL.  CBC with a white count 11.5 with 82% neutrophils, hemoglobin 13.5 g/dL and platelets 136.  CMP showed a glucose of 139, BUN of 30, creatinine 1.63 and total bilirubin 3.1 mg/dL.  Imaging: CT abdomen/pelvis with contrast showed questionable cecal mass and aortic atherosclerosis.   Review of Systems: As mentioned in the history of present illness. All other systems reviewed and are negative. 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 His smoking use included cigarettes. He started smoking about 68 years ago. He has a 10.00 pack-year smoking history. He has never used smokeless tobacco. He reports that he does not currently use alcohol. He reports that he does not use drugs.  No Known Allergies  Family History  Problem Relation Age of Onset   Cancer Mother    Cancer Father    Stroke Brother    Cancer Sister    Healthy Daughter    Cancer Sister    Lung cancer Brother    Healthy Daughter    Colon cancer Neg Hx    Esophageal cancer Neg Hx    Rectal cancer Neg Hx    Stomach cancer Neg Hx     Prior to Admission medications   Medication Sig Start Date End Date Taking? Authorizing Provider  albuterol (VENTOLIN HFA) 108 (90 Base) MCG/ACT inhaler Inhale 2 puffs into  the lungs every 4 (four) hours as needed for wheezing or shortness of breath.  10/03/18   [provider]  carbidopa-levodopa (SINEMET CR) 50-200 MG tablet Take 1 tablet by mouth at bedtime. 09/24/21   Penumalli, Earlean Polka, MD  carbidopa-levodopa (SINEMET IR) 25-100 MG tablet Take 2 tablets at 7 AM, 2 tablets at 10 AM, 2 tablets at 2 PM and 1 tablet at 6 PM 09/24/21   Penumalli, Earlean Polka, MD  glycopyrrolate (ROBINUL) 1 MG tablet Take 1 tablet (1 mg total) by mouth 2  (two) times daily. 09/24/21   Penumalli, Earlean Polka, MD  ibuprofen (ADVIL,MOTRIN) 400 MG tablet Take 400 mg by mouth every 6 (six) hours as needed for headache or moderate pain.    [provider]  sertraline (ZOLOFT) 25 MG tablet Take 1 tablet (25 mg total) by mouth daily. 09/24/21   Penumalli, Earlean Polka, MD  tamsulosin (FLOMAX) 0.4 MG CAPS capsule Take 1 capsule (0.4 mg total) by mouth daily. 04/16/20   Vanessa Kick, MD  traZODone (DESYREL) 50 MG tablet Take 50 mg by mouth at bedtime. 08/17/20   [provider]    Physical Exam: Vitals:   10/23/21 1419 10/23/21 1445 10/23/21 1515 10/23/21 1600  BP: (!) 165/77 (!) 168/75 (!) 174/78 (!) 167/74  Pulse: 70 66 66 63  Resp: (!) '22 13 14 15  '$ Temp:      TempSrc:      SpO2: 100% 100% 100% 100%  Weight:      Height:       Physical Exam Vitals and nursing note reviewed.  Constitutional:      Appearance: He is well-developed and normal weight.  HENT:     Head: Normocephalic.  Eyes:     General: No scleral icterus.    Pupils: Pupils are equal, round, and reactive to light.  Neck:     Vascular: No JVD.  Cardiovascular:     Rate and Rhythm: Normal rate and regular rhythm.     Heart sounds: S1 normal and S2 normal.  Pulmonary:     Effort: Pulmonary effort is normal.     Breath sounds: Normal breath sounds. No wheezing, rhonchi or rales.  Abdominal:     General: Bowel sounds are normal.     Palpations: Abdomen is soft.     Tenderness: There is abdominal tenderness in the left lower quadrant. There is no right CVA tenderness or left CVA tenderness.  Musculoskeletal:     Cervical back: Neck supple.     Right lower leg: No edema.     Left lower leg: No edema.  Skin:    General: Skin is warm and dry.     Coloration: Skin is not jaundiced.  Neurological:     General: No focal deficit present.     Mental Status: He is alert and oriented to person, place, and time.     Cranial Nerves: No cranial nerve deficit.     Motor:  Tremor present.  Psychiatric:        Mood and Affect: Mood normal.        Behavior: Behavior normal.    Data Reviewed:  There are no new results to review at this time.  Assessment and Plan: Principal Problem:   AKI (acute kidney injury) ([place]) Observation/telemetry. Continue IV fluids. Avoid hypotension. Avoid nephrotoxins. Monitor intake and output. Monitor renal function electrolytes.  Active Problems:   Colonic mass GI consult appreciated. Will need colonoscopy. Analgesics as needed. Antiemetics as needed.  Parkinson's disease ([place]) Continue Sinemet. Fall precautions. Consider PT evaluation.    Thrombocytopenia ([facility]) Monitor platelet count.    BPH (benign prostatic hyperplasia) Continue tamsulosin 0.4 mg p.o. daily.    Hyperbilirubinemia Gilbert's syndrome? Follow bilirubin level..    Aortic atherosclerosis ([place])   Hyperlipidemia Currently not on medical therapy. Follow-up with primary care provider.    Advance Care Planning: Full code.  Consults: [facility].  Family Communication:   Severity of Illness: The appropriate patient status for this patient is INPATIENT. Inpatient status is judged to be reasonable and necessary in order to provide the required intensity of service to ensure the patient's safety. The patient's presenting symptoms, physical exam findings, and initial radiographic and laboratory data in the context of their chronic comorbidities is felt to place them at high risk for further clinical deterioration. Furthermore, it is not anticipated that the patient will be medically stable for discharge from the hospital within 2 midnights of admission.   * I certify that at the point of admission it is my clinical judgment that the patient will require inpatient hospital care spanning beyond 2 midnights from the point of admission due to high intensity of service, high risk for further deterioration and high frequency of surveillance  required.*  Author: Reubin Milan, MD 10/23/2021 4:36 PM  For on call review [email or web address].   This document was prepared using [employer] and may contain some unintended transcription errors.

## 2021-10-23 NOTE — ED Triage Notes (Signed)
Pt to ED via EMS from home c/o increased weakness; Pt having multiple falls witnessed by grandson; Which is apparently pt baselines.  EL:MRAJHHIDUP, and history of falls. Pt not on blood thinners, No LOC. No obvious deformity, Pt also complaining of abdominal pain., nausea and diarrhea.Orientation at baseline. A&O X 3. Last VS 132/78, P 85, RR22, 98%RA, CBG 160. No medications given by EMS.

## 2021-10-23 NOTE — ED Notes (Signed)
Patient transported to CT 

## 2021-10-23 NOTE — Consult Note (Addendum)
Referring Provider: Dr. Tomi Bamberger, [place] Primary Care Physician:  [name], MD Primary Gastroenterologist:  Dr. Hilarie Fredrickson  Reason for Consultation:  Colon mass  HPI: Theodore King is a 76 y.o. male with past medical history of severe Parkinson's disease who was brought to the emergency department at [facility] for evaluation of increasing weakness and recurrent falls.  Also was having vomiting, diarrhea, and diffuse abdominal pain.  CT scan of the abdomen and pelvis without contrast showed the following  IMPRESSION: 1. Questionable focal mass-like irregular wall thickening of the cecum beyond the ileocecal valve with mild distention of the cecum and diffusely dilated small bowel with gradual transition to non-dilated terminal ileum. 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 Recommend colonoscopy for further evaluation. 2. Aortic Atherosclerosis (ICD10-I70.0).  GI was consulted due to findings.  Patient unable to provide history.  No family at bedside and 2 family members were unable to be reached.  Colonoscopy May 2014 showed moderate diverticulosis and 2 polyps that were removed.  1. Surgical [P], ascending, polyp (frags) - FRAGMENTS OF TUBULAR ADENOMA. NO HIGH GRADE DYSPLASIA OR MALIGNANCY IDENTIFIED. 2. Surgical [P], hepatic flexure, polyp - SESSILE SERRATED ADENOMA. NO HIGH GRADE DYSPLASIA OR MALIGNANCY IDENTIFIED.  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 Authorizing Provider  albuterol (VENTOLIN HFA) 108 (90 Base) MCG/ACT inhaler Inhale 2 puffs into the lungs every 4 (four) hours as needed for wheezing or shortness of breath.  10/03/18   [provider]  carbidopa-levodopa (SINEMET CR) 50-200 MG tablet Take 1 tablet by mouth at bedtime. 09/24/21   Penumalli, Earlean Polka, MD  carbidopa-levodopa (SINEMET IR) 25-100 MG tablet Take 2 tablets at 7 AM, 2 tablets at 10 AM, 2 tablets at 2 PM and 1 tablet at 6 PM 09/24/21   Penumalli, Earlean Polka, MD  glycopyrrolate (ROBINUL) 1 MG tablet Take 1 tablet (1 mg total) by mouth 2 (two) times daily. 09/24/21   Penumalli, Earlean Polka, MD  ibuprofen (ADVIL,MOTRIN) 400 MG tablet Take 400 mg by mouth every 6 (six) hours as needed for headache or moderate pain.    [provider]  sertraline (ZOLOFT) 25 MG tablet Take 1 tablet (25 mg total) by mouth daily. 09/24/21   Penumalli, Earlean Polka, MD  tamsulosin (FLOMAX) 0.4 MG CAPS capsule Take 1 capsule (0.4 mg total) by mouth daily. 04/16/20   Vanessa Kick, MD  traZODone (DESYREL) 50 MG tablet Take 50 mg by mouth at bedtime. 08/17/20   [provider]    Current Facility-Administered Medications  Medication Dose Route Frequency Provider Last Rate Last Admin   0.9 %  sodium chloride infusion  1,000 mL Intravenous Continuous Dorie Rank, MD       0.9 %  sodium chloride infusion  1,000 mL Intravenous Continuous Dorie Rank, MD       Current Outpatient Medications  Medication Sig Dispense Refill   albuterol (VENTOLIN HFA) 108 (90 Base) MCG/ACT inhaler Inhale 2 puffs into the lungs every 4 (four) hours as needed for wheezing or shortness of breath.       carbidopa-levodopa (SINEMET CR) 50-200 MG tablet Take 1 tablet by mouth at bedtime. 90 tablet 4   carbidopa-levodopa (SINEMET IR) 25-100 MG tablet Take 2 tablets at 7 AM, 2 tablets at 10 AM, 2 tablets at 2 PM and 1 tablet at 6 PM 210 tablet 12   glycopyrrolate (ROBINUL) 1 MG tablet Take 1 tablet (1 mg total) by mouth 2 (two) times daily. 60 tablet 6   ibuprofen (ADVIL,MOTRIN) 400 MG tablet Take 400 mg by mouth every 6 (six) hours as needed for headache or moderate pain.     sertraline (ZOLOFT) 25 MG tablet Take 1 tablet (25 mg total) by mouth daily. 30 tablet 6   tamsulosin (FLOMAX) 0.4 MG CAPS capsule Take 1 capsule (0.4 mg total) by mouth daily. 30 capsule 1   traZODone (DESYREL) 50 MG tablet Take 50 mg by mouth at bedtime.      Allergies as of 10/23/2021   (No Known Allergies)    Family History  Problem Relation Age of Onset   Cancer Mother    Cancer Father    Stroke Brother    Cancer Sister    Healthy Daughter    Cancer Sister    Lung cancer Brother    Healthy Daughter    Colon cancer Neg Hx    Esophageal cancer Neg Hx    Rectal cancer Neg Hx    Stomach cancer Neg Hx     Social History   Socioeconomic History   Marital status: Single    Spouse name: Not on file   Number of children: 3   Years of education: 11th   Highest education level: 11th grade  Occupational History    Employer: RETIRED  Tobacco Use   Smoking status: Former    Packs/day: 0.50    Years: 20.00    Pack years: 10.00    Types: Cigarettes    Start date: 08/14/1953    Quit date: 05/23/1974    Years since quitting: 47.4   Smokeless tobacco: Never  Vaping Use   Vaping Use: Never used  Substance and Sexual Activity   Alcohol use: Not Currently   Drug use: No   Sexual activity: Never  Other Topics Concern   Not on file  Social History Narrative   09/24/21 lives with dgtr [name]   Social Determinants of Health   Financial Resource Strain: Not on file  Food Insecurity: Not on file   Transportation Needs: Not on file  Physical Activity: Not on file  Stress: Not on file  Social Connections: Not on file  Intimate Partner Violence: Not on file    Review of Systems: Unable to be obtained due to patient being unable to provide history with severe Parkinson's.  Physical Exam: Vital signs in last 24 hours: Temp:  [97.5 F (36.4 C)] 97.5 F (36.4 C) (05/19 1112) Pulse Rate:  [56-78] 66 (05/19 1515) Resp:  [13-22] 14 (05/19 1515) BP: (126-174)/(63-117) 174/78 (05/19 1515) SpO2:  [93 %-100 %] 100 % (05/19 1515) Weight:  [79.4 kg] 79.4 kg (  05/19 1111)   General:  Alert, chronically ill-appearing, in NAD Head:  Normocephalic and atraumatic. Eyes:  Sclera clear, no icterus.  Conjunctiva pink. Ears:  Normal auditory acuity. Mouth:  No deformity or lesions.   Lungs:  Clear throughout to auscultation.  No wheezes, crackles, or rhonchi.  Heart:  Regular rate and rhythm; no murmurs, clicks, rubs, or gallops. Abdomen:  Soft, non-distended.  BS present.  Non-tender. Msk:  Symmetrical without gross deformities. Pulses:  Normal pulses noted. Extremities:  Without clubbing or edema. Neurologic:  Alert and oriented x 4;  grossly normal neurologically. Skin:  Intact without significant lesions or rashes. Psych:  Alert and cooperative. Normal mood and affect.  Intake/Output this shift: Total I/O In: -  Out: 1350 [Urine:1350]  Lab Results: Recent Labs    10/23/21 1136  WBC 11.5*  HGB 13.5  HCT 41.3  PLT 136*   BMET Recent Labs    10/23/21 1136  NA 142  K 4.4  CL 108  CO2 24  GLUCOSE 139*  BUN 30*  CREATININE 1.63*  CALCIUM 9.5   LFT Recent Labs    10/23/21 1136  PROT 7.8  ALBUMIN 4.3  AST 17  ALT 7  ALKPHOS 61  BILITOT 3.1*    Studies/Results: CT ABDOMEN PELVIS WO CONTRAST  Result Date: 10/23/2021 CLINICAL DATA:  Abdominal pain with nausea and diarrhea. EXAM: CT ABDOMEN AND PELVIS WITHOUT CONTRAST TECHNIQUE: Multidetector CT imaging of the  abdomen and pelvis was performed following the standard protocol without IV contrast. RADIATION DOSE REDUCTION: This exam was performed according to the departmental dose-optimization program which includes automated exposure control, adjustment of the mA and/or kV according to patient size and/or use of iterative reconstruction technique. COMPARISON:  CT chest abdomen pelvis dated August 22, 2020. FINDINGS: Lower chest: No acute abnormality. Hepatobiliary: No focal liver abnormality is seen. No gallstones, gallbladder wall thickening, or biliary dilatation. Pancreas: Unremarkable. No pancreatic ductal dilatation or surrounding inflammatory changes. Spleen: Normal in size without focal abnormality. Adrenals/Urinary Tract: Adrenal glands are unremarkable. Kidneys are normal, without renal calculi, focal lesion, or hydronephrosis. Bladder is unremarkable. Stomach/Bowel: The stomach is within normal limits. There are multiple dilated loops of small bowel with gradual transition to non-dilated terminal ileum. The cecum is mildly distended too, and there is questionable mass-like irregular wall thickening of the cecum distal to the ileocecal valve (series 6, image 37). The remaining colon distal to this area is mostly decompressed. History of prior appendectomy. Vascular/Lymphatic: Aortic atherosclerosis. No enlarged abdominal or pelvic lymph nodes. Reproductive: Unchanged prostate median lobe hypertrophy indenting the bladder base. Other: No abdominal wall hernia or abnormality. Trace ascites. No pneumoperitoneum. Musculoskeletal: No acute or significant osseous findings. Mild anasarca. IMPRESSION: 1. Questionable focal mass-like irregular wall thickening of the cecum beyond the ileocecal valve with mild distention of the cecum and diffusely dilated small bowel with gradual transition to non-dilated terminal ileum. 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 Recommend colonoscopy for further evaluation. 2.  Aortic Atherosclerosis (ICD10-I70.0). Electronically Signed   By: Titus Dubin M.D.   On: 10/23/2021 14:26    IMPRESSION:  *76 year old male with severe Parkinson's disease who presented with weakness, falls at home, vomiting, diarrhea, diffuse abdominal pain.  CT scan shows concern for partially obstructing cecal mass.  PLAN: -Patient will need colonoscopy at some point, timing and tolerability to prep to be determined.  Could need NG tube to administer.  We are ordering a speech pathology evaluation to assess his swallowing.  Will follow  over the weekend.  **Dr. Carlean Purl attempted to contact the patient's daughter and sister that are listed in the chart, no answer.  Laban Emperor. Zehr  10/23/2021, 3:29 PM  Attending:  I have also evaluated the patient reviewed the images.  He needs a colonoscopy.  He is in no shape to do that yet.  We will need to get consent and determine ability to swallow prep.  I would not be surprised if we need to insert an NG tube to prep this patient.  Additionally he appears to have a partial small bowel obstruction related to the ileocecal valve/cecal mass and he could potentially need some decompression with an NG tube as well.  We will follow-up tomorrow.  Gatha Mayer, MD, [facility] See [name] on call - gastroenterology for best contact person 10/23/2021 5:02 PM

## 2021-10-23 NOTE — ED Provider Notes (Signed)
[facility] DEPT Provider Note   CSN: [id number] Arrival date & time: 10/23/21  1103     History  Chief Complaint  Patient presents with   Abdominal Pain   Fall   Diarrhea    Theodore King is a 76 y.o. male.   Abdominal Pain Associated symptoms: diarrhea   Fall Associated symptoms include abdominal pain.  Diarrhea Associated symptoms: abdominal pain    Patient has history of Parkinson's disease, hyperlipidemia, vertigo, chronic gait instability who presents to the ED for evaluation of increasing weakness recurrent falls vomiting and diarrhea.  Patient states he is had issues with vomiting and diarrhea over the last several days.  He has had several episodes of each.  He is also having diffuse abdominal pain and discomfort.  Patient states he has been feeling much weaker in his legs.  He does have Parkinson's and normally has some difficulty with gait but it is worse and he has been falling more than usual.  He denies injuring himself in the falls but today he felt like he could barely lift his legs and EMS was called to bring him to the emergency room for evaluation  Home Medications Prior to Admission medications   Medication Sig Start Date End Date Taking? Authorizing Provider  albuterol (VENTOLIN HFA) 108 (90 Base) MCG/ACT inhaler Inhale 2 puffs into the lungs every 4 (four) hours as needed for wheezing or shortness of breath.  10/03/18   [provider]  carbidopa-levodopa (SINEMET CR) 50-200 MG tablet Take 1 tablet by mouth at bedtime. 09/24/21   Penumalli, Earlean Polka, MD  carbidopa-levodopa (SINEMET IR) 25-100 MG tablet Take 2 tablets at 7 AM, 2 tablets at 10 AM, 2 tablets at 2 PM and 1 tablet at 6 PM 09/24/21   Penumalli, Earlean Polka, MD  glycopyrrolate (ROBINUL) 1 MG tablet Take 1 tablet (1 mg total) by mouth 2 (two) times daily. 09/24/21   Penumalli, Earlean Polka, MD  ibuprofen (ADVIL,MOTRIN) 400 MG tablet Take 400 mg by mouth every 6 (six)  hours as needed for headache or moderate pain.    [provider]  sertraline (ZOLOFT) 25 MG tablet Take 1 tablet (25 mg total) by mouth daily. 09/24/21   Penumalli, Earlean Polka, MD  tamsulosin (FLOMAX) 0.4 MG CAPS capsule Take 1 capsule (0.4 mg total) by mouth daily. 04/16/20   Vanessa Kick, MD  traZODone (DESYREL) 50 MG tablet Take 50 mg by mouth at bedtime. 08/17/20   [provider]      Allergies    Patient has no known allergies.    Review of Systems   Review of Systems  Gastrointestinal:  Positive for abdominal pain and diarrhea.   Physical Exam Updated Vital Signs BP (!) 174/78   Pulse 66   Temp (!) 97.5 F (36.4 C) (Oral)   Resp 14   Ht 1.829 m (6')   Wt 79.4 kg   SpO2 100%   BMI 23.73 kg/m  Physical Exam Vitals and nursing note reviewed.  Constitutional:      Appearance: He is well-developed. He is not diaphoretic.  HENT:     Head: Normocephalic and atraumatic.     Right Ear: External ear normal.     Left Ear: External ear normal.  Eyes:     General: No scleral icterus.       Right eye: No discharge.        Left eye: No discharge.     Conjunctiva/sclera: Conjunctivae normal.  Neck:     Trachea: No tracheal deviation.  Cardiovascular:     Rate and Rhythm: Normal rate and regular rhythm.  Pulmonary:     Effort: Pulmonary effort is normal. No respiratory distress.     Breath sounds: Normal breath sounds. No stridor. No wheezing or rales.  Abdominal:     General: Bowel sounds are normal. There is no distension.     Palpations: Abdomen is soft.     Tenderness: There is generalized abdominal tenderness. There is no guarding or rebound.  Musculoskeletal:        General: No tenderness or deformity.     Cervical back: Neck supple.  Skin:    General: Skin is warm and dry.     Findings: No rash.  Neurological:     Cranial Nerves: No cranial nerve deficit (no facial droop, extraocular movements intact, no slurred speech).     Sensory: No sensory  deficit.     Motor: Weakness present. No abnormal muscle tone or seizure activity.     Coordination: Coordination normal.     Comments: Movements slow but able to lift both arms and legs off the bed  Psychiatric:        Mood and Affect: Mood normal.    ED Results / Procedures / Treatments   Labs (all labs ordered are listed, but only abnormal results are displayed) Labs Reviewed  COMPREHENSIVE METABOLIC PANEL - Abnormal; Notable for the following components:      Result Value   Glucose, Bld 139 (*)    BUN 30 (*)    Creatinine, Ser 1.63 (*)    Total Bilirubin 3.1 (*)    GFR, Estimated 43 (*)    All other components within normal limits  CBC WITH DIFFERENTIAL/PLATELET - Abnormal; Notable for the following components:   WBC 11.5 (*)    Platelets 136 (*)    Neutro Abs 9.3 (*)    All other components within normal limits  URINALYSIS, ROUTINE W REFLEX MICROSCOPIC - Abnormal; Notable for the following components:   Color, Urine AMBER (*)    Hgb urine dipstick SMALL (*)    Ketones, ur 5 (*)    All other components within normal limits  LIPASE, BLOOD    EKG None  Radiology CT ABDOMEN PELVIS WO CONTRAST  Result Date: 10/23/2021 CLINICAL DATA:  Abdominal pain with nausea and diarrhea. EXAM: CT ABDOMEN AND PELVIS WITHOUT CONTRAST TECHNIQUE: Multidetector CT imaging of the abdomen and pelvis was performed following the standard protocol without IV contrast. RADIATION DOSE REDUCTION: This exam was performed according to the departmental dose-optimization program which includes automated exposure control, adjustment of the mA and/or kV according to patient size and/or use of iterative reconstruction technique. COMPARISON:  CT chest abdomen pelvis dated August 22, 2020. FINDINGS: Lower chest: No acute abnormality. Hepatobiliary: No focal liver abnormality is seen. No gallstones, gallbladder wall thickening, or biliary dilatation. Pancreas: Unremarkable. No pancreatic ductal dilatation or  surrounding inflammatory changes. Spleen: Normal in size without focal abnormality. Adrenals/Urinary Tract: Adrenal glands are unremarkable. Kidneys are normal, without renal calculi, focal lesion, or hydronephrosis. Bladder is unremarkable. Stomach/Bowel: The stomach is within normal limits. There are multiple dilated loops of small bowel with gradual transition to non-dilated terminal ileum. The cecum is mildly distended too, and there is questionable mass-like irregular wall thickening of the cecum distal to the ileocecal valve (series 6, image 37). The remaining colon distal to this area is mostly decompressed. History of prior appendectomy. Vascular/Lymphatic: Aortic atherosclerosis.  No enlarged abdominal or pelvic lymph nodes. Reproductive: Unchanged prostate median lobe hypertrophy indenting the bladder base. Other: No abdominal wall hernia or abnormality. Trace ascites. No pneumoperitoneum. Musculoskeletal: No acute or significant osseous findings. Mild anasarca. IMPRESSION: 1. Questionable focal mass-like irregular wall thickening of the cecum beyond the ileocecal valve with mild distention of the cecum and diffusely dilated small bowel with gradual transition to non-dilated terminal ileum. 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 Recommend colonoscopy for further evaluation. 2. Aortic Atherosclerosis (ICD10-I70.0). Electronically Signed   By: Titus Dubin M.D.   On: 10/23/2021 14:26    Procedures Procedures    Medications Ordered in ED Medications  sodium chloride 0.9 % bolus 1,000 mL (0 mLs Intravenous Stopped 10/23/21 1418)    Followed by  0.9 %  sodium chloride infusion (has no administration in time range)  sodium chloride 0.9 % bolus 1,000 mL (1,000 mLs Intravenous New Bag/Given 10/23/21 1419)    Followed by  0.9 %  sodium chloride infusion (has no administration in time range)  ondansetron (ZOFRAN) injection 4 mg (4 mg Intravenous Given 10/23/21 1209)  morphine (PF) 4  MG/ML injection 4 mg (4 mg Intravenous Given 10/23/21 1209)    ED Course/ Medical Decision Making/ A&P Clinical Course as of 10/23/21 1532  Fri Oct 23, 2021  1407 Urinalysis, Routine w reflex microscopic Urine, In & Out Cath(!) Hematuria noted.  No definitive signs of infection [JK]  1407 CBC with Diff(!) Leukocytosis noted [JK]  1407 Comprehensive metabolic panel(!) BUN and creatinine elevated [JK]  1448 CT ABDOMEN PELVIS WO CONTRAST CT scan findings concerning for possible obstructive neoplasm [JK]  1505 Case discussed with Dr Olevia Bowens [JK]  1531 Case discussed with Alonza Bogus [JK]    Clinical Course User Index [JK] Dorie Rank, MD                           Medical Decision Making Differential diagnosis includes but not limited to, dehydration, gastroenteritis, colitis, diverticulitis, electrolyte disturbance, anemia  Problems Addressed: Cecum mass: acute illness or injury that poses a threat to life or bodily functions Parkinson disease [facility]): chronic illness or injury Partial intestinal obstruction, unspecified cause ([place]): acute illness or injury that poses a threat to life or bodily functions Weakness: complicated acute illness or injury  Amount and/or Complexity of Data Reviewed External Data Reviewed: notes.    Details: Notes reviewed from patient's neurology office visit last month Labs: ordered. Decision-making details documented in ED Course. Radiology: ordered. Decision-making details documented in ED Course.  Risk Prescription drug management. Decision regarding hospitalization.   Patient presented with complaints of weakness in the setting of vomiting and diarrhea.  Patient has underlying Parkinson's disease affecting his mobility.  Patient's laboratory test did show dehydration with BUN and creatinine elevation.  CT scan was performed and it is concerning for cecal mass causing a partial obstruction.  I reviewed previous records and patient previously had a  colonoscopy by [facility].  I will consult with them.  With the patient's weakness and comorbidities I think he will require hospitalization for IV hydration and further evaluation        Final Clinical Impression(s) / ED Diagnoses Final diagnoses:  Cecum mass  Partial intestinal obstruction, unspecified cause ([place])  Parkinson disease ([place])  Weakness     Dorie Rank, MD 10/23/21 [phone]

## 2021-10-23 NOTE — ED Notes (Signed)
GI at bedside

## 2021-10-24 ENCOUNTER — Inpatient Hospital Stay (HOSPITAL_COMMUNITY): Payer: Medicare HMO

## 2021-10-24 DIAGNOSIS — K56609 Unspecified intestinal obstruction, unspecified as to partial versus complete obstruction: Secondary | ICD-10-CM | POA: Diagnosis not present

## 2021-10-24 DIAGNOSIS — N179 Acute kidney failure, unspecified: Secondary | ICD-10-CM | POA: Diagnosis not present

## 2021-10-24 DIAGNOSIS — K6389 Other specified diseases of intestine: Secondary | ICD-10-CM | POA: Diagnosis not present

## 2021-10-24 LAB — COMPREHENSIVE METABOLIC PANEL
ALT: 6 U/L (ref 0–44)
AST: 13 U/L — ABNORMAL LOW (ref 15–41)
Albumin: 3.4 g/dL — ABNORMAL LOW (ref 3.5–5.0)
Alkaline Phosphatase: 45 U/L (ref 38–126)
Anion gap: <3 — ABNORMAL LOW (ref 5–15)
BUN: 20 mg/dL (ref 8–23)
CO2: 28 mmol/L (ref 22–32)
Calcium: 8.6 mg/dL — ABNORMAL LOW (ref 8.9–10.3)
Chloride: 113 mmol/L — ABNORMAL HIGH (ref 98–111)
Creatinine, Ser: 1.06 mg/dL (ref 0.61–1.24)
GFR, Estimated: >60 mL/min (ref >60)
Glucose, Bld: 96 mg/dL (ref 70–99)
Potassium: 3.9 mmol/L (ref 3.5–5.1)
Sodium: 143 mmol/L (ref 135–145)
Total Bilirubin: 3.2 mg/dL — ABNORMAL HIGH (ref 0.3–1.2)
Total Protein: 6 g/dL — ABNORMAL LOW (ref 6.5–8.1)

## 2021-10-24 LAB — CBC
HCT: 36.5 % — ABNORMAL LOW (ref 39.0–52.0)
Hemoglobin: 11.7 g/dL — ABNORMAL LOW (ref 13.0–17.0)
MCH: 27.7 pg (ref 26.0–34.0)
MCHC: 32.1 g/dL (ref 30.0–36.0)
MCV: 86.5 fL (ref 80.0–100.0)
Platelets: 105 10*3/uL — ABNORMAL LOW (ref 150–400)
RBC: 4.22 MIL/uL (ref 4.22–5.81)
RDW: 14 % (ref 11.5–15.5)
WBC: 6.1 10*3/uL (ref 4.0–10.5)
nRBC: 0 % (ref 0.0–0.2)

## 2021-10-24 MED ORDER — TRAZODONE HCL 50 MG PO TABS
50.0000 mg | ORAL_TABLET | Freq: Every day | ORAL | Status: DC
Start: 2021-10-24 — End: 2021-10-27
  Administered 2021-10-24 – 2021-10-26 (×3): 50 mg
  Filled 2021-10-24 (×2): qty 0.5
  Filled 2021-10-24: qty 1

## 2021-10-24 MED ORDER — GLYCOPYRROLATE 1 MG PO TABS
1.0000 mg | ORAL_TABLET | Freq: Two times a day (BID) | ORAL | Status: DC
  Administered 2021-10-24 – 2021-10-27 (×6): 1 mg
  Filled 2021-10-24 (×6): qty 1

## 2021-10-24 MED ORDER — SERTRALINE HCL 25 MG PO TABS
25.0000 mg | ORAL_TABLET | Freq: Every day | ORAL | Status: DC
  Administered 2021-10-25 – 2021-10-27 (×3): 25 mg
  Filled 2021-10-24 (×3): qty 1

## 2021-10-24 MED ORDER — CHLORHEXIDINE GLUCONATE CLOTH 2 % EX PADS
6.0000 | MEDICATED_PAD | Freq: Every day | CUTANEOUS | Status: DC
  Administered 2021-10-24 – 2021-10-27 (×4): 6 via TOPICAL

## 2021-10-24 MED ORDER — CARBIDOPA-LEVODOPA 25-100 MG PO TABS
1.0000 | ORAL_TABLET | Freq: Every day | ORAL | Status: DC
  Administered 2021-10-24 – 2021-10-26 (×3): 1
  Filled 2021-10-24 (×4): qty 1

## 2021-10-24 MED ORDER — CARBIDOPA-LEVODOPA 25-100 MG PO TABS
2.0000 | ORAL_TABLET | Freq: Three times a day (TID) | ORAL | Status: DC
  Administered 2021-10-25 – 2021-10-27 (×6): 2
  Filled 2021-10-24 (×6): qty 2

## 2021-10-24 NOTE — Progress Notes (Signed)
   Patient Name: Theodore King Date of Encounter: 10/24/2021, 9:15 AM    Subjective  Awake and alert today - does not remember ED yesterday Has decreased urine output and suspected bladder retention and is about to get a Foley'Having lower abd pain OK to eat and drink per speech path   Objective  BP (!) 197/79 (BP Location: Right Arm)   Pulse 66   Temp 97.8 F (36.6 C) (Oral)   Resp 17   Ht 6' (1.829 m)   Wt 79.4 kg   SpO2 100%   BMI 23.73 kg/m  Chronically ill BM NAD Masked facies, bradykinetic, pill-rolling tremor and slow speeck Alert and oriented x 3  Lungs cta Cor NL Abd mod distended and tympanitic w/ some BS, not high pitched, mod tender RLQ and suprapubic     Latest Ref Rng & Units 10/23/2021   11:36 AM 05/27/2021   11:25 PM 05/03/2021    8:34 PM  CBC  WBC 4.0 - 10.5 K/uL 11.5   5.2   4.9    Hemoglobin 13.0 - 17.0 g/dL 13.5   12.3   12.3    Hematocrit 39.0 - 52.0 % 41.3   39.0   38.3    Platelets 150 - 400 K/uL 136   135   125      Recent Labs  Lab 10/23/21 1136  NA 142  K 4.4  CL 108  CO2 24  GLUCOSE 139*  BUN 30*  CREATININE 1.63*  CALCIUM 9.5   Lab Results  Component Value Date   ALT 7 10/23/2021   AST 17 10/23/2021   ALKPHOS 61 10/23/2021   BILITOT 3.1 (H) 10/23/2021       Assessment and Plan  Cecal mass and low-grade SBO  Urine retention AKI ? Related to BOO Severe Parkinson's  Check Abd film and then decide on approach - does he need NG tube - probably not Once I see films will order diet (clears) vs NG tube placement and start colonoscopy prep and try to get that done tomorrow (best case scenario)   I spoke to daughter Theodore King and explained all this AM and that we plan colonoscopy and if cancer would need surgery (also told him)   Gatha Mayer, MD, [facility] See [name] on call - gastroenterology for best contact person my pager is  [phone] I  am on AM today and tomorrow  10/24/2021 9:15 AM

## 2021-10-24 NOTE — Progress Notes (Signed)
PROGRESS NOTE    Theodore King  BJS:[id number] DOB: [birth year] DOA: 10/23/2021 PCP: [name], MD    Brief Narrative: 76 year old male admitted with increasing generalized weakness abdominal pain and diarrhea.  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  Urine was positive for ketones and hemoglobinuria.  His white count was 11.5.  KUB this morning shows dilated small bowel loops and NG tube has been ordered by GI.  Assessment & Plan:   Principal Problem:   AKI (acute kidney injury) ([place]) Active Problems:   Parkinson's disease ([place])   Hyperlipidemia   Thrombocytopenia ([facility])   BPH (benign prostatic hyperplasia)   Hyperbilirubinemia   Colonic mass   Aortic atherosclerosis ([place])   #1 cecal mass plan for colonoscopy noted.  #2 acute urinary retention bladder scan was positive for over 900 cc of urine.  Foley catheter placed.  #3 small bowel obstruction KUB shows dilated loops of small bowel.  NG tube ordered.  Follow-up KUB in a.m.  #4 Parkinson's disease continue Sinemet  #5 acute kidney injury likely secondary to bladder outlet obstruction which should improve with Foley in place now.  Recheck labs in AM.    #6 BPH continue Flomax   Estimated body mass index is 23.73 kg/m as calculated from the following:   Height as of this encounter: 6' (1.829 m).   Weight as of this encounter: 79.4 kg.  DVT prophylaxis:scd  Code Status: full Family Communication:  Disposition Plan:  Status is: Inpatient Remains inpatient appropriate because:  bowel obstruction and cecal mass   Consultants:  GI  Procedures: None Antimicrobials: None  Subjective: Patient resting in bed speaking by speech therapy had bedside swallow evaluation and recommended okay for diet.  Objective: Vitals:   10/23/21 1853 10/23/21 1954 10/24/21 0245 10/24/21 1343  BP: (!) 166/76 (!) 168/78 (!) 197/79 (!) 184/70  Pulse: 69 66 66 71  Resp: '18 19 17 20  '$ Temp: 97.8 F (36.6 C)  98.1 F (36.7 C) 97.8 F (36.6 C) 97.8 F (36.6 C)  TempSrc: Oral Oral Oral Oral  SpO2: 100% 100% 100% 97%  Weight:      Height:        Intake/Output Summary (Last 24 hours) at 10/24/2021 1425 Last data filed at 10/24/2021 1344 Gross per 24 hour  Intake 1801.96 ml  Output 950 ml  Net 851.96 ml   Filed Weights   10/23/21 1111  Weight: 79.4 kg    Examination: Foley bag is empty he has a condom cath in place and his suprapubic area is tender and distended  General exam: Appears calm and comfortable  Respiratory system: Clear to auscultation. Respiratory effort normal. Cardiovascular system: S1 & S2 heard, RRR. No JVD, murmurs, rubs, gallops or clicks. No pedal edema. Gastrointestinal system: Abdomen is distended, soft and l suprapubic tender distended no organomegaly or masses felt. Normal bowel sounds heard. Central nervous system: Alert and oriented. No focal neurological deficits. Extremities: Symmetric 5 x 5 power. Skin: No rashes, lesions or ulcers Psychiatry: Judgement and insight appear normal. Mood & affect appropriate.     Data Reviewed: I have personally reviewed following labs and imaging studies  CBC: Recent Labs  Lab 10/23/21 1136  WBC 11.5*  NEUTROABS 9.3*  HGB 13.5  HCT 41.3  MCV 86.2  PLT 607*   Basic Metabolic Panel: Recent Labs  Lab 10/23/21 1136  NA 142  K 4.4  CL 108  CO2 24  GLUCOSE 139*  BUN  30*  CREATININE 1.63*  CALCIUM 9.5   GFR: Estimated Creatinine Clearance: 41 mL/min (A) (by C-G formula based on SCr of 1.63 mg/dL (H)). Liver Function Tests: Recent Labs  Lab 10/23/21 1136  AST 17  ALT 7  ALKPHOS 61  BILITOT 3.1*  PROT 7.8  ALBUMIN 4.3   Recent Labs  Lab 10/23/21 1136  LIPASE 28   No results for input(s): AMMONIA in the last 168 hours. Coagulation Profile: No results for input(s): INR, PROTIME in the last 168 hours. Cardiac Enzymes: No results for input(s): CKTOTAL, CKMB, CKMBINDEX, TROPONINI in the last 168  hours. BNP (last 3 results) No results for input(s): PROBNP in the last 8760 hours. HbA1C: No results for input(s): HGBA1C in the last 72 hours. CBG: No results for input(s): GLUCAP in the last 168 hours. Lipid Profile: No results for input(s): CHOL, HDL, LDLCALC, TRIG, CHOLHDL, LDLDIRECT in the last 72 hours. Thyroid Function Tests: No results for input(s): TSH, T4TOTAL, FREET4, T3FREE, THYROIDAB in the last 72 hours. Anemia Panel: No results for input(s): VITAMINB12, FOLATE, FERRITIN, TIBC, IRON, RETICCTPCT in the last 72 hours. Sepsis Labs: No results for input(s): PROCALCITON, LATICACIDVEN in the last 168 hours.  No results found for this or any previous visit (from the past 240 hour(s)).       Radiology Studies: CT ABDOMEN PELVIS WO CONTRAST  Result Date: 10/23/2021 CLINICAL DATA:  Abdominal pain with nausea and diarrhea. EXAM: CT ABDOMEN AND PELVIS WITHOUT CONTRAST TECHNIQUE: Multidetector CT imaging of the abdomen and pelvis was performed following the standard protocol without IV contrast. RADIATION DOSE REDUCTION: This exam was performed according to the departmental dose-optimization program which includes automated exposure control, adjustment of the mA and/or kV according to patient size and/or use of iterative reconstruction technique. COMPARISON:  CT chest abdomen pelvis dated August 22, 2020. FINDINGS: Lower chest: No acute abnormality. Hepatobiliary: No focal liver abnormality is seen. No gallstones, gallbladder wall thickening, or biliary dilatation. Pancreas: Unremarkable. No pancreatic ductal dilatation or surrounding inflammatory changes. Spleen: Normal in size without focal abnormality. Adrenals/Urinary Tract: Adrenal glands are unremarkable. Kidneys are normal, without renal calculi, focal lesion, or hydronephrosis. Bladder is unremarkable. Stomach/Bowel: The stomach is within normal limits. There are multiple dilated loops of small bowel with gradual transition to  non-dilated terminal ileum. The cecum is mildly distended too, and there is questionable mass-like irregular wall thickening of the cecum distal to the ileocecal valve (series 6, image 37). The remaining colon distal to this area is mostly decompressed. History of prior appendectomy. Vascular/Lymphatic: Aortic atherosclerosis. No enlarged abdominal or pelvic lymph nodes. Reproductive: Unchanged prostate median lobe hypertrophy indenting the bladder base. Other: No abdominal wall hernia or abnormality. Trace ascites. No pneumoperitoneum. Musculoskeletal: No acute or significant osseous findings. Mild anasarca. IMPRESSION: 1. Questionable focal mass-like irregular wall thickening of the cecum beyond the ileocecal valve with mild distention of the cecum and diffusely dilated small bowel with gradual transition to non-dilated terminal ileum. 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 Recommend colonoscopy for further evaluation. 2. Aortic Atherosclerosis (ICD10-I70.0). Electronically Signed   By: Titus Dubin M.D.   On: 10/23/2021 14:26   DG Abd 2 Views  Result Date: 10/24/2021 CLINICAL DATA:  Small-bowel obstruction EXAM: ABDOMEN - 2 VIEW COMPARISON:  None Available. FINDINGS: Air-filled mildly dilated loops of small bowel. No free air. No acute osseous abnormality. IMPRESSION: Air-filled, mildly dilated loops of small bowel may reflect distal small bowel obstruction. Electronically Signed   By: Addison Lank.D.  On: 10/24/2021 12:02        Scheduled Meds:  carbidopa-levodopa  1 tablet Oral QHS   carbidopa-levodopa  1 tablet Oral Daily   carbidopa-levodopa  2 tablet Oral TID   Chlorhexidine Gluconate Cloth  6 each Topical Daily   feeding supplement  1 Container Oral TID BM   glycopyrrolate  1 mg Oral BID   sertraline  25 mg Oral Daily   tamsulosin  0.4 mg Oral Daily   traZODone  50 mg Oral QHS   Continuous Infusions:  sodium chloride     sodium chloride 1,000 mL (10/23/21  1835)     LOS: 1 day    Time spent: 39 min  Georgette Shell, MD  10/24/2021, 2:25 PM

## 2021-10-24 NOTE — Progress Notes (Signed)
X-ray confirmed NG tube in gastric fundus. NG tube set to low intermittent suction.

## 2021-10-24 NOTE — Progress Notes (Addendum)
Initial Nutrition Assessment  INTERVENTION:   -Will monitor for diet advancement  -Continue Boost Breeze po TID, each supplement provides 250 kcal and 9 grams of protein once diet advanced  NUTRITION DIAGNOSIS:   Increased nutrient needs related to acute illness as evidenced by estimated needs.  GOAL:   Patient will meet greater than or equal to 90% of their needs  MONITOR:   Diet advancement, Labs, Weight trends, I & O's  REASON FOR ASSESSMENT:   Malnutrition Screening Tool    ASSESSMENT:   76 year old male admitted with increasing generalized weakness abdominal pain and diarrhea.  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 KUB this morning shows dilated small bowel loops.  Patient currently NPO. GI recommending placement of NGT for suction d/t dilated loops in small bowel. Plan is for colonoscopy.   SLP had evaluated earlier today and recommended regular diet. Pt was on clears yesterday.  Will monitor for improvement and diet advancement.  Per chart review, pt was having increased weakness PTA as well as diarrhea and vomiting.   Per weight records, pt has lost 29 lbs since 12/26/20 (14% wt loss x 10 months, insignificant for time frame).   Medications reviewed.  Labs reviewed.  NUTRITION - FOCUSED PHYSICAL EXAM:  Unable to complete, RD remote  Diet Order:   Diet Order             Diet NPO time specified  Diet effective midnight                   EDUCATION NEEDS:   Not appropriate for education at this time  Skin:  Skin Assessment: Reviewed RN Assessment  Last BM:  5/20 -type 6  Height:   Ht Readings from Last 1 Encounters:  10/23/21 6' (1.829 m)    Weight:   Wt Readings from Last 1 Encounters:  10/23/21 79.4 kg    BMI:  Body mass index is 23.73 kg/m.  Estimated Nutritional Needs:   Kcal:  1900-2100  Protein:  90-100g  Fluid:  1.9L/day  Theodore Bibles, MS, RD, LDN Inpatient Clinical Dietitian Contact  information available via Amion

## 2021-10-24 NOTE — Progress Notes (Signed)
  Xray shows multiple dilated loops small bowel I have ordered NG tube LIWS and to clamp x 1 hr to give meds  Will f/u tomorrow - repeat Xray ordered

## 2021-10-24 NOTE — Progress Notes (Signed)
Patient pulled NG tube out, replaced tube. Asked Dr. Hal Hope for order for x-ray to confirm placement.

## 2021-10-24 NOTE — Evaluation (Signed)
Clinical/Bedside Swallow Evaluation Patient Details  Name: FOSTER FRERICKS MRN: [id number] Date of Birth: 1945/08/02  Today's Date: 10/24/2021 Time: SLP Start Time (ACUTE ONLY): 0810 SLP Stop Time (ACUTE ONLY): 0825 SLP Time Calculation (min) (ACUTE ONLY): 15 min  Past Medical History:  Past Medical History:  Diagnosis Date   Abnormal dreams 09/06/2013   Aortic atherosclerosis ([place]) 10/23/2021   BPH (benign prostatic hyperplasia) 10/23/2021   Disorders of bursae and tendons in shoulder region, unspecified    Hyperglycemia 05/18/2013   Hyperlipidemia LDL goal < 100 05/18/2013   Other and unspecified hyperlipidemia    Other and unspecified hyperlipidemia    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 male with PMH: Parkinson's disease, REM sleep behavior disorder, hyperglycemia, HDL, toxic neuropathy, benign paroxysmal vertigo who was brough to ED due to abdominal pain, nausea, vomiting and diarrhea for past 4-5 days associated with generalized weakness with h/o multiple falls. In ED, he was afebrile, oxygen saturations 99% on RA. CT abdomen/pelvis showed questionable cecal mass and aortic atherosclerosis. GI was consulted and recommendation was made for colonoscopy. SLP swallow evaluation ordered secondary to patient with h/o severe Parkinson's disease and to ensure he could safely swallow bowel prep solution.    Assessment / Plan / Recommendation  Clinical Impression  Patient not currently  presenting with clinical s/s of dysphagia as per this bedside/clinical swallow evaluation. His voice was clear and strong and remained so during evaluation. Swallow initiation appeared timely with liquids and puree solids with patient feeding self (drinking water (thin liquid) via straw). No overt s/s aspiration or penetration observed. Although SLP did not observe any significant, overt s/s of aspiration/penetration or oral delays, with his h/o Parkinson's disease, recommending SLP f/u at least one time to ensure diet toleration. SLP Visit Diagnosis: Dysphagia, unspecified (R13.10)    Aspiration Risk  No limitations;Mild aspiration risk    Diet Recommendation Regular;Thin liquid   Liquid Administration via: Cup;Straw Medication Administration: Whole meds with liquid Supervision: Patient able to self feed Compensations: Slow rate;Small sips/bites Postural Changes: Seated upright at 90 degrees    Other  Recommendations Oral Care Recommendations: Oral care BID    Recommendations for follow up therapy are one component of a multi-disciplinary discharge planning process, led by the attending physician.  Recommendations may be updated based on patient status, additional functional criteria and insurance authorization.  Follow up Recommendations No SLP follow up      Assistance Recommended at Discharge None  Functional Status Assessment Patient has had a recent decline in their functional status and demonstrates the ability to make significant improvements in function in a reasonable and predictable amount of time.  Frequency and Duration min 1 x/week  1 week       Prognosis Prognosis for Safe Diet Advancement: Good      Swallow Study   General Date of Onset: 10/23/21 HPI: Patient is a 76 y.o. male with PMH: Parkinson's disease, REM sleep  behavior disorder, hyperglycemia, HDL, toxic neuropathy, benign paroxysmal vertigo who was brough to ED due to abdominal pain, nausea, vomiting and  diarrhea for past 4-5 days associated with generalized weakness with h/o multiple falls. In ED, he was afebrile, oxygen saturations 99% on RA. CT abdomen/pelvis showed questionable cecal mass and aortic atherosclerosis. GI was consulted and recommendation was made for colonoscopy. SLP swallow evaluation ordered secondary to patient with h/o severe Parkinson's disease and to ensure he could safely swallow bowel prep solution. Type of Study: Bedside Swallow Evaluation Previous Swallow Assessment: remote BSE in 2018 Diet Prior to this Study: NPO Temperature Spikes Noted: No Respiratory Status: Room air History of Recent Intubation: No Behavior/Cognition: Cooperative;Pleasant mood;Alert Oral Care Completed by SLP: No Oral Cavity - Dentition: Adequate natural dentition Vision: Functional for self-feeding Self-Feeding Abilities: Able to feed self Patient Positioning: Upright in bed Baseline Vocal Quality: Normal Volitional Cough: Strong Volitional Swallow: Able to elicit    Oral/Motor/Sensory Function Overall Oral Motor/Sensory Function: Within functional limits   Ice Chips     Thin Liquid Thin Liquid: Within functional limits Presentation: Straw;Self Fed    Nectar Thick     Honey Thick     Puree Puree: Within functional limits Presentation: Self Fed;Spoon   Solid     Solid: Not tested      Sonia Baller, MA, CCC-SLP Speech Therapy

## 2021-10-25 ENCOUNTER — Inpatient Hospital Stay (HOSPITAL_COMMUNITY): Payer: Medicare HMO

## 2021-10-25 DIAGNOSIS — K56609 Unspecified intestinal obstruction, unspecified as to partial versus complete obstruction: Secondary | ICD-10-CM | POA: Diagnosis not present

## 2021-10-25 DIAGNOSIS — K6389 Other specified diseases of intestine: Secondary | ICD-10-CM | POA: Diagnosis not present

## 2021-10-25 DIAGNOSIS — N179 Acute kidney failure, unspecified: Secondary | ICD-10-CM | POA: Diagnosis not present

## 2021-10-25 MED ORDER — BISACODYL 5 MG PO TBEC
20.0000 mg | DELAYED_RELEASE_TABLET | Freq: Once | ORAL | Status: AC
  Administered 2021-10-25: 20 mg via ORAL
  Filled 2021-10-25: qty 4

## 2021-10-25 MED ORDER — AMLODIPINE BESYLATE 5 MG PO TABS
5.0000 mg | ORAL_TABLET | Freq: Every day | ORAL | Status: DC
Start: 2021-10-25 — End: 2021-10-27
  Administered 2021-10-25 – 2021-10-27 (×3): 5 mg via ORAL
  Filled 2021-10-25 (×3): qty 1

## 2021-10-25 MED ORDER — PEG-KCL-NACL-NASULF-NA ASC-C 100 G PO SOLR
0.5000 | Freq: Once | ORAL | Status: AC
  Administered 2021-10-25: 100 g via ORAL
  Filled 2021-10-25: qty 1

## 2021-10-25 MED ORDER — METOCLOPRAMIDE HCL 5 MG/ML IJ SOLN
10.0000 mg | Freq: Four times a day (QID) | INTRAMUSCULAR | Status: AC
  Administered 2021-10-25 – 2021-10-26 (×6): 10 mg via INTRAVENOUS
  Filled 2021-10-25 (×6): qty 2

## 2021-10-25 MED ORDER — PEG-KCL-NACL-NASULF-NA ASC-C 100 G PO SOLR: 0.5000 | Freq: Once | ORAL | Status: DC

## 2021-10-25 MED ORDER — PEG-KCL-NACL-NASULF-NA ASC-C 100 G PO SOLR
0.5000 | Freq: Once | ORAL | Status: AC
  Administered 2021-10-25: 100 g via ORAL

## 2021-10-25 MED ORDER — SODIUM CHLORIDE 0.9 % IV SOLN
1000.0000 mL | INTRAVENOUS | Status: DC
  Administered 2021-10-25: 1000 mL via INTRAVENOUS

## 2021-10-25 NOTE — Progress Notes (Signed)
   Patient Name: BINNIE VONDERHAAR Date of Encounter: 10/25/2021, 10:58 AM    Subjective  NG tube pulled ot x 2 Tol clears now   Objective  BP (!) 192/75 (BP Location: Right Arm)   Pulse 67   Temp 98.4 F (36.9 C) (Oral)   Resp 18   Ht 6' (1.829 m)   Wt 79.4 kg   SpO2 100%   BMI 23.73 kg/m   Elderly chron ill Masked facies, bradykinetic, pill-rolling tremor and slow sopeech Lungs cta Cor NL S1S2 Abd soft, less distended, mildly tender RLQ  Recent Labs  Lab 10/23/21 1136 10/24/21 1601  HGB 13.5 11.7*  HCT 41.3 36.5*  WBC 11.5* 6.1  PLT 136* 105*   Recent Labs  Lab 10/23/21 1136 10/24/21 1601  AST 17 13*  ALT 7 6  ALKPHOS 61 45  BILITOT 3.1* 3.2*  PROT 7.8 6.0*  ALBUMIN 4.3 3.4*   Recent Labs  Lab 10/23/21 1136 10/24/21 1601  NA 142 143  K 4.4 3.9  CL 108 113*  CO2 24 28  GLUCOSE 139* 96  BUN 30* 20  CREATININE 1.63* 1.06  CALCIUM 9.5 8.6*     DG Abd 1 View CLINICAL DATA:  76 year old male with history of intractable nausea and vomiting.  EXAM: ABDOMEN - 1 VIEW  COMPARISON:  Abdominal radiograph 10/24/2021.  FINDINGS: Nasogastric tube has been removed. Several prominent but nondilated loops of gas-filled small bowel are noted throughout the central abdomen measuring up to 2.6 cm in diameter. Gas and stool is also noted throughout the colon and distal rectum. No pneumoperitoneum.  IMPRESSION: 1. Nonspecific, nonobstructive bowel gas pattern, as above. 2. No pneumoperitoneum.  Electronically Signed   By: Vinnie Langton M.D.   On: 10/25/2021 07:46     Assessment and Plan  Cecal/IC valve mass and SBO - improved somewhat  AKI resolved Urine retention - treated with Foley  Will try to prep for colonoscopy tomorrow (Dr. Henrene Pastor) Continuous Reglan x 6 doses  The risks and benefits as well as alternatives of endoscopic procedure(s) have been discussed and reviewed. All questions answered. The patient agrees to proceed.   Gatha Mayer, MD, [facility] See [name] on call - gastroenterology for best contact person 10/25/2021 10:58 AM

## 2021-10-25 NOTE — Progress Notes (Signed)
Patient pulled NG tube out again. Will wait to see if provider would like it replaced again.

## 2021-10-25 NOTE — Progress Notes (Signed)
PROGRESS NOTE    Theodore King  AFB:[id number] DOB: 19-Nov-1945 DOA: 10/23/2021 PCP: [name], MD    Brief Narrative: 76 year old male admitted with increasing generalized weakness abdominal pain and diarrhea.  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  Urine was positive for ketones and hemoglobinuria.  His white count was 11.5.  KUB this morning shows dilated small bowel loops and NG tube has been ordered by GI.  Assessment & Plan:   Principal Problem:   AKI (acute kidney injury) ([place]) Active Problems:   Parkinson's disease ([place])   Hyperlipidemia   Thrombocytopenia (HCC)   BPH (benign prostatic hyperplasia)   Hyperbilirubinemia   Colonic mass   Aortic atherosclerosis (HCC)   #1 cecal mass plan for colonoscopy noted for tomorrow  #2 acute urinary retention bladder scan was positive for over 900 cc of urine.  Foley catheter placed.  #3 small bowel obstruction KUB shows dilated loops of small bowel.  Patient pulled out NG tube x2.  Tolerating clear liquids.   #4 Parkinson's disease continue Sinemet  #5 acute kidney injury likely secondary to bladder outlet obstruction resolved with Foley placement.    #6 BPH continue Flomax   Estimated body mass index is 23.73 kg/m as calculated from the following:   Height as of this encounter: 6' (1.829 m).   Weight as of this encounter: 79.4 kg.  DVT prophylaxis:scd  Code Status: full Family Communication:  Disposition Plan:  Status is: Inpatient Remains inpatient appropriate because:  bowel obstruction and cecal mass   Consultants:  GI  Procedures: None Antimicrobials: None  Subjective:  Pulled out NG tube x2. Urine very dark in color Foley in place He says he does not remember pulling out  NG-tube Objective: Vitals:   10/24/21 0245 10/24/21 1343 10/24/21 2002 10/25/21 0516  BP: (!) 197/79 (!) 184/70 (!) 163/77 (!) 192/75  Pulse: 66 71 72 67  Resp: _0 Temp: 97.8 F (36.6 C)  97.8 F (36.6 C) 98.2 F (36.8 C) 98.4 F (36.9 C)  TempSrc: Oral Oral Oral Oral  SpO2: 100% 97% 98% 100%  Weight:      Height:        Intake/Output Summary (Last 24 hours) at 10/25/2021 1232 Last data filed at 10/25/2021 1000 Gross per 24 hour  Intake 2566.21 ml  Output 1700 ml  Net 866.21 ml    Filed Weights   10/23/21 1111  Weight: 79.4 kg    Examination: Foley in place with dark urine  General exam: Appears calm and comfortable  Respiratory system: Clear to auscultation. Respiratory effort normal. Cardiovascular system: S1 & S2 heard, RRR. No JVD, murmurs, rubs, gallops or clicks. No pedal edema. Gastrointestinal system: Abdomen is less distended, soft and no organomegaly or masses felt. Normal bowel sounds heard. Central nervous system: Alert and oriented. No focal neurological deficits. Extremities: Symmetric 5 x 5 power. Skin: No rashes, lesions or ulcers Psychiatry: Judgement and insight appear normal. Mood & affect appropriate.     Data Reviewed: I have personally reviewed following labs and imaging studies  CBC: Recent Labs  Lab 10/23/21 1136 10/24/21 1601  WBC 11.5* 6.1  NEUTROABS 9.3*  --   HGB 13.5 11.7*  HCT 41.3 36.5*  MCV 86.2 86.5  PLT 136* 105*    Basic Metabolic Panel: Recent Labs  Lab 10/23/21 1136 10/24/21 1601  NA 142 143  K 4.4 3.9  CL 108 113*  CO2 24 28  GLUCOSE 139* 96  BUN 30* 20  CREATININE 1.63* 1.06  CALCIUM 9.5 8.6*    GFR: Estimated Creatinine Clearance: 63 mL/min (by C-G formula based on SCr of 1.06 mg/dL). Liver Function Tests: Recent Labs  Lab 10/23/21 1136 10/24/21 1601  AST 17 13*  ALT 7 6  ALKPHOS 61 45  BILITOT 3.1* 3.2*  PROT 7.8 6.0*  ALBUMIN 4.3 3.4*    Recent Labs  Lab 10/23/21 1136  LIPASE 28    No results for input(s): AMMONIA in the last 168 hours. Coagulation Profile: No results for input(s): INR, PROTIME in the last 168 hours. Cardiac Enzymes: No results for input(s): CKTOTAL,  CKMB, CKMBINDEX, TROPONINI in the last 168 hours. BNP (last 3 results) No results for input(s): PROBNP in the last 8760 hours. HbA1C: No results for input(s): HGBA1C in the last 72 hours. CBG: No results for input(s): GLUCAP in the last 168 hours. Lipid Profile: No results for input(s): CHOL, HDL, LDLCALC, TRIG, CHOLHDL, LDLDIRECT in the last 72 hours. Thyroid Function Tests: No results for input(s): TSH, T4TOTAL, FREET4, T3FREE, THYROIDAB in the last 72 hours. Anemia Panel: No results for input(s): VITAMINB12, FOLATE, FERRITIN, TIBC, IRON, RETICCTPCT in the last 72 hours. Sepsis Labs: No results for input(s): PROCALCITON, LATICACIDVEN in the last 168 hours.  No results found for this or any previous visit (from the past 240 hour(s)).       Radiology Studies: CT ABDOMEN PELVIS WO CONTRAST  Result Date: 10/23/2021 CLINICAL DATA:  Abdominal pain with nausea and diarrhea. EXAM: CT ABDOMEN AND PELVIS WITHOUT CONTRAST TECHNIQUE: Multidetector CT imaging of the abdomen and pelvis was performed following the standard protocol without IV contrast. RADIATION DOSE REDUCTION: This exam was performed according to the departmental dose-optimization program which includes automated exposure control, adjustment of the mA and/or kV according to patient size and/or use of iterative reconstruction technique. COMPARISON:  CT chest abdomen pelvis dated August 22, 2020. FINDINGS: Lower chest: No acute abnormality. Hepatobiliary: No focal liver abnormality is seen. No gallstones, gallbladder wall thickening, or biliary dilatation. Pancreas: Unremarkable. No pancreatic ductal dilatation or surrounding inflammatory changes. Spleen: Normal in size without focal abnormality. Adrenals/Urinary Tract: Adrenal glands are unremarkable. Kidneys are normal, without renal calculi, focal lesion, or hydronephrosis. Bladder is unremarkable. Stomach/Bowel: The stomach is within normal limits. There are multiple dilated loops of  small bowel with gradual transition to non-dilated terminal ileum. The cecum is mildly distended too, and there is questionable mass-like irregular wall thickening of the cecum distal to the ileocecal valve (series 6, image 37). The remaining colon distal to this area is mostly decompressed. History of prior appendectomy. Vascular/Lymphatic: Aortic atherosclerosis. No enlarged abdominal or pelvic lymph nodes. Reproductive: Unchanged prostate median lobe hypertrophy indenting the bladder base. Other: No abdominal wall hernia or abnormality. Trace ascites. No pneumoperitoneum. Musculoskeletal: No acute or significant osseous findings. Mild anasarca. IMPRESSION: 1. Questionable focal mass-like irregular wall thickening of the cecum beyond the ileocecal valve with mild distention of the cecum and diffusely dilated small bowel with gradual transition to non-dilated terminal ileum. 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 Recommend colonoscopy for further evaluation. 2. Aortic Atherosclerosis (ICD10-I70.0). Electronically Signed   By: Titus Dubin M.D.   On: 10/23/2021 14:26   DG Abd 1 View  Result Date: 10/25/2021 CLINICAL DATA:  76 year old male with history of intractable nausea and vomiting. EXAM: ABDOMEN - 1 VIEW COMPARISON:  Abdominal radiograph 10/24/2021. FINDINGS: Nasogastric tube has been removed. Several prominent but nondilated loops of gas-filled  small bowel are noted throughout the central abdomen measuring up to 2.6 cm in diameter. Gas and stool is also noted throughout the colon and distal rectum. No pneumoperitoneum. IMPRESSION: 1. Nonspecific, nonobstructive bowel gas pattern, as above. 2. No pneumoperitoneum. Electronically Signed   By: Vinnie Langton M.D.   On: 10/25/2021 07:46   DG Abd 1 View  Result Date: 10/24/2021 CLINICAL DATA:  NG tube placement. EXAM: ABDOMEN - 1 VIEW COMPARISON:  Abdominal x-ray 10/24/2021 FINDINGS: Nasogastric tube tip is in the gastric fundus.  Dilated small bowel loops are again seen diffusely. Degree of distention has mildly decreased. IMPRESSION: 1. Nasogastric tube tip in the gastric fundus. 2. Dilated small bowel loops again seen, mildly decreased in size. Electronically Signed   By: Ronney Asters M.D.   On: 10/24/2021 23:08   DG Abd 2 Views  Result Date: 10/24/2021 CLINICAL DATA:  Small-bowel obstruction EXAM: ABDOMEN - 2 VIEW COMPARISON:  None Available. FINDINGS: Air-filled mildly dilated loops of small bowel. No free air. No acute osseous abnormality. IMPRESSION: Air-filled, mildly dilated loops of small bowel may reflect distal small bowel obstruction. Electronically Signed   By: Macy Mis M.D.   On: 10/24/2021 12:02        Scheduled Meds:  carbidopa-levodopa  1 tablet Oral QHS   carbidopa-levodopa  1 tablet Per Tube Daily   carbidopa-levodopa  2 tablet Per Tube TID   Chlorhexidine Gluconate Cloth  6 each Topical Daily   feeding supplement  1 Container Oral TID BM   glycopyrrolate  1 mg Per Tube BID   metoCLOPramide (REGLAN) injection  10 mg Intravenous Q6H   peg 3350 powder  0.5 kit Oral Once   And   peg 3350 powder  0.5 kit Oral Once   sertraline  25 mg Per Tube Daily   tamsulosin  0.4 mg Oral Daily   traZODone  50 mg Per Tube QHS   Continuous Infusions:  sodium chloride     sodium chloride 125 mL/hr at 10/25/21 0752     LOS: 2 days    Time spent: 39 min  Georgette Shell, MD  10/25/2021, 12:32 PM

## 2021-10-26 ENCOUNTER — Encounter (HOSPITAL_COMMUNITY): Payer: Self-pay

## 2021-10-26 ENCOUNTER — Inpatient Hospital Stay (HOSPITAL_COMMUNITY): Payer: Medicare HMO | Admitting: Anesthesiology

## 2021-10-26 ENCOUNTER — Encounter (HOSPITAL_COMMUNITY)
Admission: EM | Disposition: A | Discharge status: Home Health | Payer: Self-pay | Source: Home / Self Care | Attending: Internal Medicine

## 2021-10-26 DIAGNOSIS — N179 Acute kidney failure, unspecified: Secondary | ICD-10-CM | POA: Diagnosis not present

## 2021-10-26 DIAGNOSIS — K635 Polyp of colon: Secondary | ICD-10-CM

## 2021-10-26 DIAGNOSIS — K5730 Diverticulosis of large intestine without perforation or abscess without bleeding: Secondary | ICD-10-CM

## 2021-10-26 DIAGNOSIS — J449 Chronic obstructive pulmonary disease, unspecified: Secondary | ICD-10-CM

## 2021-10-26 DIAGNOSIS — R935 Abnormal findings on diagnostic imaging of other abdominal regions, including retroperitoneum: Secondary | ICD-10-CM

## 2021-10-26 DIAGNOSIS — G20 Parkinson's disease: Secondary | ICD-10-CM

## 2021-10-26 DIAGNOSIS — D123 Benign neoplasm of transverse colon: Secondary | ICD-10-CM

## 2021-10-26 HISTORY — PX: POLYPECTOMY: SHX5525

## 2021-10-26 HISTORY — PX: COLONOSCOPY WITH PROPOFOL: SHX5780

## 2021-10-26 LAB — COMPREHENSIVE METABOLIC PANEL
ALT: 6 U/L (ref 0–44)
AST: 13 U/L — ABNORMAL LOW (ref 15–41)
Albumin: 2.9 g/dL — ABNORMAL LOW (ref 3.5–5.0)
Alkaline Phosphatase: 40 U/L (ref 38–126)
Anion gap: 4 — ABNORMAL LOW (ref 5–15)
BUN: 11 mg/dL (ref 8–23)
CO2: 26 mmol/L (ref 22–32)
Calcium: 8.2 mg/dL — ABNORMAL LOW (ref 8.9–10.3)
Chloride: 110 mmol/L (ref 98–111)
Creatinine, Ser: 0.86 mg/dL (ref 0.61–1.24)
GFR, Estimated: >60 mL/min (ref >60)
Glucose, Bld: 105 mg/dL — ABNORMAL HIGH (ref 70–99)
Potassium: 3.3 mmol/L — ABNORMAL LOW (ref 3.5–5.1)
Sodium: 140 mmol/L (ref 135–145)
Total Bilirubin: 2.6 mg/dL — ABNORMAL HIGH (ref 0.3–1.2)
Total Protein: 5.6 g/dL — ABNORMAL LOW (ref 6.5–8.1)

## 2021-10-26 LAB — CBC
HCT: 31.4 % — ABNORMAL LOW (ref 39.0–52.0)
Hemoglobin: 10.8 g/dL — ABNORMAL LOW (ref 13.0–17.0)
MCH: 28.9 pg (ref 26.0–34.0)
MCHC: 34.4 g/dL (ref 30.0–36.0)
MCV: 84 fL (ref 80.0–100.0)
Platelets: 108 10*3/uL — ABNORMAL LOW (ref 150–400)
RBC: 3.74 MIL/uL — ABNORMAL LOW (ref 4.22–5.81)
RDW: 13.4 % (ref 11.5–15.5)
WBC: 6.6 10*3/uL (ref 4.0–10.5)
nRBC: 0 % (ref 0.0–0.2)

## 2021-10-26 LAB — MAGNESIUM: Magnesium: 1.6 mg/dL — ABNORMAL LOW (ref 1.7–2.4)

## 2021-10-26 SURGERY — COLONOSCOPY WITH PROPOFOL
Anesthesia: Monitor Anesthesia Care

## 2021-10-26 MED ORDER — PROPOFOL 500 MG/50ML IV EMUL
INTRAVENOUS | Status: DC | PRN
  Administered 2021-10-26: 125 ug/kg/min via INTRAVENOUS

## 2021-10-26 MED ORDER — PROPOFOL 500 MG/50ML IV EMUL
INTRAVENOUS | Status: AC
  Filled 2021-10-26: qty 50

## 2021-10-26 MED ORDER — SODIUM CHLORIDE 0.9 % IV SOLN: INTRAVENOUS | Status: DC

## 2021-10-26 SURGICAL SUPPLY — 22 items

## 2021-10-26 NOTE — Progress Notes (Signed)
SLP Cancellation Note  Patient Details Name: CORDIE BUENING MRN: [id number] DOB: [birth year]   Cancelled treatment:       Reason Eval/Treat Not Completed: Other (comment) (NPO for procedure (colonoscopy). Will f/u next date)  Sonia Baller, MA, CCC-SLP Speech Therapy

## 2021-10-26 NOTE — Transfer of Care (Signed)
Immediate Anesthesia Transfer of Care Note  Patient: Theodore King  Procedure(s) Performed: COLONOSCOPY WITH PROPOFOL POLYPECTOMY  Patient Location: PACU  Anesthesia Type:MAC  Level of Consciousness: awake, alert , oriented and patient cooperative  Airway & Oxygen Therapy: Patient Spontanous Breathing and Patient connected to face mask oxygen  Post-op Assessment: Report given to RN and Post -op Vital signs reviewed and stable  Post vital signs: Reviewed and stable  Last Vitals:  Vitals Value Taken Time  BP 157/54 10/26/21 1600  Temp 36.6 C 10/26/21 1559  Pulse 59 10/26/21 1602  Resp 18 10/26/21 1602  SpO2 100 % 10/26/21 1602  Vitals shown include unvalidated device data.  Last Pain:  Vitals:   10/26/21 1559  TempSrc: Temporal  PainSc: Asleep         Complications: No notable events documented.

## 2021-10-26 NOTE — Progress Notes (Signed)
PROGRESS NOTE    Theodore King  YQM:[id number] DOB: [birth year] DOA: 10/23/2021 PCP: [name], MD    Brief Narrative: 76 year old male admitted with increasing generalized weakness abdominal pain and diarrhea.  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  Urine was positive for ketones and hemoglobinuria.  His white count was 11.5.  KUB this morning shows dilated small bowel loops and NG tube has been ordered by GI.  Assessment & Plan:   Principal Problem:   AKI (acute kidney injury) ([place]) Active Problems:   Parkinson's disease ([place])   Hyperlipidemia   Thrombocytopenia ([facility])   BPH (benign prostatic hyperplasia)   Hyperbilirubinemia   Colonic mass   Aortic atherosclerosis ([facility])   Benign neoplasm of transverse colon   Abnormal CT of the abdomen   #1 cecal mass by CT scan-colonoscopy shows no evidence of cecal mass.  5 mm polyp in the transverse colon.  Diverticulosis in the sigmoid colon.  No cecal mass.  #2 acute urinary retention bladder scan was positive for over 900 cc of urine.  Foley catheter placed.  #3 small bowel obstruction -resolving.  Advance diet as tolerated.  Consult physical therapy  #4 Parkinson's disease continue Sinemet  #5 acute kidney injury likely secondary to bladder outlet obstruction resolved with Foley placement.    #6 BPH continue Flomax   Estimated body mass index is 23.71 kg/m as calculated from the following:   Height as of this encounter: 6' (1.829 m).   Weight as of this encounter: 79.3 kg.  DVT prophylaxis:scd  Code Status: full Family Communication:  Disposition Plan:  Status is: Inpatient Remains inpatient appropriate because:  bowel obstruction and cecal mass   Consultants:  GI  Procedures: None Antimicrobials: None  Subjective:  Pulled out NG tube x2. Urine very dark in color Foley in place He says he does not remember pulling out  NG-tube Objective: Vitals:   10/26/21 1439 10/26/21 1559  10/26/21 1610 10/26/21 1620  BP: (!) 178/78 (!) 150/38 (!) 167/40 (!) 161/48  Pulse: 68 61 61 65  Resp: '17 14 18 14  '$ Temp:  97.9 F (36.6 C)    TempSrc: Temporal Temporal    SpO2: 99% 100% 100% 100%  Weight: 79.3 kg     Height: 6' (1.829 m)       Intake/Output Summary (Last 24 hours) at 10/26/2021 1650 Last data filed at 10/26/2021 1601 Gross per 24 hour  Intake 1120 ml  Output 2250 ml  Net -1130 ml    Filed Weights   10/23/21 1111 10/26/21 1439  Weight: 79.4 kg 79.3 kg    Examination: Foley in place with dark urine  General exam: Appears calm and comfortable  Respiratory system: Clear to auscultation. Respiratory effort normal. Cardiovascular system: S1 & S2 heard, RRR. No JVD, murmurs, rubs, gallops or clicks. No pedal edema. Gastrointestinal system: Abdomen is less distended, soft and no organomegaly or masses felt. Normal bowel sounds heard. Central nervous system: Alert and oriented. No focal neurological deficits. Extremities: Symmetric 5 x 5 power. Skin: No rashes, lesions or ulcers Psychiatry: Judgement and insight appear normal. Mood & affect appropriate.     Data Reviewed: I have personally reviewed following labs and imaging studies  CBC: Recent Labs  Lab 10/23/21 1136 10/24/21 1601 10/26/21 0934  WBC 11.5* 6.1 6.6  NEUTROABS 9.3*  --   --   HGB 13.5 11.7* 10.8*  HCT 41.3 36.5* 31.4*  MCV 86.2 86.5 84.0  PLT  136* 105* 108*    Basic Metabolic Panel: Recent Labs  Lab 10/23/21 1136 10/24/21 1601 10/26/21 0934  NA 142 143 140  K 4.4 3.9 3.3*  CL 108 113* 110  CO2 '24 28 26  '$ GLUCOSE 139* 96 105*  BUN 30* 20 11  CREATININE 1.63* 1.06 0.86  CALCIUM 9.5 8.6* 8.2*  MG  --   --  1.6*    GFR: Estimated Creatinine Clearance: 77.7 mL/min (by C-G formula based on SCr of 0.86 mg/dL). Liver Function Tests: Recent Labs  Lab 10/23/21 1136 10/24/21 1601 10/26/21 0934  AST 17 13* 13*  ALT '7 6 6  '$ ALKPHOS 61 45 40  BILITOT 3.1* 3.2* 2.6*  PROT 7.8  6.0* 5.6*  ALBUMIN 4.3 3.4* 2.9*    Recent Labs  Lab 10/23/21 1136  LIPASE 28    No results for input(s): AMMONIA in the last 168 hours. Coagulation Profile: No results for input(s): INR, PROTIME in the last 168 hours. Cardiac Enzymes: No results for input(s): CKTOTAL, CKMB, CKMBINDEX, TROPONINI in the last 168 hours. BNP (last 3 results) No results for input(s): PROBNP in the last 8760 hours. HbA1C: No results for input(s): HGBA1C in the last 72 hours. CBG: No results for input(s): GLUCAP in the last 168 hours. Lipid Profile: No results for input(s): CHOL, HDL, LDLCALC, TRIG, CHOLHDL, LDLDIRECT in the last 72 hours. Thyroid Function Tests: No results for input(s): TSH, T4TOTAL, FREET4, T3FREE, THYROIDAB in the last 72 hours. Anemia Panel: No results for input(s): VITAMINB12, FOLATE, FERRITIN, TIBC, IRON, RETICCTPCT in the last 72 hours. Sepsis Labs: No results for input(s): PROCALCITON, LATICACIDVEN in the last 168 hours.  No results found for this or any previous visit (from the past 240 hour(s)).       Radiology Studies: DG Abd 1 View  Result Date: 10/25/2021 CLINICAL DATA:  76 year old male with history of intractable nausea and vomiting. EXAM: ABDOMEN - 1 VIEW COMPARISON:  Abdominal radiograph 10/24/2021. FINDINGS: Nasogastric tube has been removed. Several prominent but nondilated loops of gas-filled small bowel are noted throughout the central abdomen measuring up to 2.6 cm in diameter. Gas and stool is also noted throughout the colon and distal rectum. No pneumoperitoneum. IMPRESSION: 1. Nonspecific, nonobstructive bowel gas pattern, as above. 2. No pneumoperitoneum. Electronically Signed   By: Vinnie Langton M.D.   On: 10/25/2021 07:46   DG Abd 1 View  Result Date: 10/24/2021 CLINICAL DATA:  NG tube placement. EXAM: ABDOMEN - 1 VIEW COMPARISON:  Abdominal x-ray 10/24/2021 FINDINGS: Nasogastric tube tip is in the gastric fundus. Dilated small bowel loops are again  seen diffusely. Degree of distention has mildly decreased. IMPRESSION: 1. Nasogastric tube tip in the gastric fundus. 2. Dilated small bowel loops again seen, mildly decreased in size. Electronically Signed   By: Ronney Asters M.D.   On: 10/24/2021 23:08        Scheduled Meds:  amLODipine  5 mg Oral Daily   carbidopa-levodopa  1 tablet Oral QHS   carbidopa-levodopa  1 tablet Per Tube Daily   carbidopa-levodopa  2 tablet Per Tube TID   Chlorhexidine Gluconate Cloth  6 each Topical Daily   feeding supplement  1 Container Oral TID BM   glycopyrrolate  1 mg Per Tube BID   metoCLOPramide (REGLAN) injection  10 mg Intravenous Q6H   sertraline  25 mg Per Tube Daily   tamsulosin  0.4 mg Oral Daily   traZODone  50 mg Per Tube QHS   Continuous Infusions:  sodium chloride Stopped (10/26/21 1601)     LOS: 3 days    Time spent: 25 min  Georgette Shell, MD  10/26/2021, 4:50 PM

## 2021-10-26 NOTE — Anesthesia Procedure Notes (Signed)
Procedure Name: MAC Date/Time: 10/26/2021 3:26 PM Performed by: Lollie Sails, CRNA Pre-anesthesia Checklist: Patient identified, Emergency Drugs available, Suction available, Patient being monitored and Timeout performed Oxygen Delivery Method: Simple face mask Placement Confirmation: positive ETCO2

## 2021-10-26 NOTE — Progress Notes (Addendum)
Patient ID: Theodore King, male   DOB: Jan 05, 1946, 76 y.o.   MRN: [id number]    Progress Note   Subjective   Day # 3 CC; cecum/IC valve mass, partial small bowel obstruction  No new labs  Patient says he feels weak, no complaints of abdominal pain, no vomiting, has completed bowel prep and having numerous bowel movements, nurse confirms stool is clear   Objective   Vital signs in last 24 hours: Temp:  [98.5 F (36.9 C)-99.1 F (37.3 C)] 98.5 F (36.9 C) (05/22 0412) Pulse Rate:  [65-74] 65 (05/22 0412) Resp:  [16-18] 16 (05/22 0412) BP: (139-176)/(61-72) 176/69 (05/22 0827) SpO2:  [97 %-100 %] 97 % (05/22 0412) Last BM Date : 10/25/21 General:    Elderly African-American male in NAD on bedside commode Heart:  Regular rate and rhythm; no murmurs Lungs: Respirations even and unlabored, lungs CTA bilaterally Abdomen:  Soft, nontender and nondistended. Normal bowel sounds. Extremities:  Without edema. Neurologic:  Alert and oriented,  grossly normal neurologically, speech slow but appropriate Psych:  Cooperative. Normal mood and affect.  Intake/Output from previous day: 05/21 0701 - 05/22 0700 In: 1759.1 [P.O.:600; I.V.:1159.1] Out: 1800 [Urine:1800] Intake/Output this shift: No intake/output data recorded.  Lab Results: Recent Labs    10/23/21 1136 10/24/21 1601  WBC 11.5* 6.1  HGB 13.5 11.7*  HCT 41.3 36.5*  PLT 136* 105*   BMET Recent Labs    10/23/21 1136 10/24/21 1601  NA 142 143  K 4.4 3.9  CL 108 113*  CO2 24 28  GLUCOSE 139* 96  BUN 30* 20  CREATININE 1.63* 1.06  CALCIUM 9.5 8.6*   LFT Recent Labs    10/24/21 1601  PROT 6.0*  ALBUMIN 3.4*  AST 13*  ALT 6  ALKPHOS 45  BILITOT 3.2*   PT/INR No results for input(s): LABPROT, INR in the last 72 hours.  Studies/Results: DG Abd 1 View  Result Date: 10/25/2021 CLINICAL DATA:  76 year old male with history of intractable nausea and vomiting. EXAM: ABDOMEN - 1 VIEW COMPARISON:  Abdominal  radiograph 10/24/2021. FINDINGS: Nasogastric tube has been removed. Several prominent but nondilated loops of gas-filled small bowel are noted throughout the central abdomen measuring up to 2.6 cm in diameter. Gas and stool is also noted throughout the colon and distal rectum. No pneumoperitoneum. IMPRESSION: 1. Nonspecific, nonobstructive bowel gas pattern, as above. 2. No pneumoperitoneum. Electronically Signed   By: Vinnie Langton M.D.   On: 10/25/2021 07:46   DG Abd 1 View  Result Date: 10/24/2021 CLINICAL DATA:  NG tube placement. EXAM: ABDOMEN - 1 VIEW COMPARISON:  Abdominal x-ray 10/24/2021 FINDINGS: Nasogastric tube tip is in the gastric fundus. Dilated small bowel loops are again seen diffusely. Degree of distention has mildly decreased. IMPRESSION: 1. Nasogastric tube tip in the gastric fundus. 2. Dilated small bowel loops again seen, mildly decreased in size. Electronically Signed   By: Ronney Asters M.D.   On: 10/24/2021 23:08   DG Abd 2 Views  Result Date: 10/24/2021 CLINICAL DATA:  Small-bowel obstruction EXAM: ABDOMEN - 2 VIEW COMPARISON:  None Available. FINDINGS: Air-filled mildly dilated loops of small bowel. No free air. No acute osseous abnormality. IMPRESSION: Air-filled, mildly dilated loops of small bowel may reflect distal small bowel obstruction. Electronically Signed   By: Macy Mis M.D.   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 Rule out partially obstructing colonic malignancy  #2 acute kidney injury resolved #3 urinary retention-has Foley #4 Parkinson's disease #5 mild thrombocytopenia  Plan; patient is scheduled for colonoscopy with Dr. Henrene Pastor this afternoon. Keep n.p.o. until post colonoscopy and start liquids as he has been several days without p.o. intake  Further recommendations pending findings at colonoscopy   Principal  Problem:   AKI (acute kidney injury) ([place]) Active Problems:   Parkinson's disease ([place])   Hyperlipidemia   Thrombocytopenia ([place])   BPH (benign prostatic hyperplasia)   Hyperbilirubinemia   Colonic mass   Aortic atherosclerosis ([place])     LOS: 3 days   [name] Esterwood PA-C 10/26/2021, 8:30 AM  GI ATTENDING  Interval history data reviewed.  Patient seen and examined.  Agree with interval progress.  Patient is for colonoscopy today to evaluate CT abnormality suggesting cecal/ileocecal mass with partial obstruction.  Colonoscopy from 2014 reviewed.  His procedure was set for later this afternoon.  Docia Chuck. Geri [place]in[place]., M.D. [facility] Division of Gastroenterology

## 2021-10-26 NOTE — Anesthesia Preprocedure Evaluation (Signed)
Anesthesia Evaluation  Patient identified by MRN, date of birth, ID band Patient awake    Reviewed: Allergy & Precautions, NPO status , Patient's Chart, lab work & pertinent test results  History of Anesthesia Complications Negative for: history of anesthetic complications  Airway Mallampati: II  TM Distance: >3 FB Neck ROM: Full    Dental  (+) Dental Advisory Given   Pulmonary COPD, former smoker,    Pulmonary exam normal        Cardiovascular negative cardio ROS Normal cardiovascular exam     Neuro/Psych Parkinson's disease    GI/Hepatic Neg liver ROS, Bowel prep,Cecal mass, partial bowel obstruction   Endo/Other  negative endocrine ROS  Renal/GU negative Renal ROS  negative genitourinary   Musculoskeletal negative musculoskeletal ROS (+)   Abdominal   Peds  Hematology  (+) Blood dyscrasia, anemia ,   Anesthesia Other Findings Pt presented with signs of obstruction but tolerated bowel prep well, no nausea or vomiting.  Reproductive/Obstetrics                             Anesthesia Physical Anesthesia Plan  ASA: 3  Anesthesia Plan: MAC   Post-op Pain Management: Minimal or no pain anticipated   Induction: Intravenous  PONV Risk Score and Plan: 1 and Propofol infusion, TIVA and Treatment may vary due to age or medical condition  Airway Management Planned: Natural Airway, Nasal Cannula and Simple Face Mask  Additional Equipment: None  Intra-op Plan:   Post-operative Plan:   Informed Consent: I have reviewed the patients History and Physical, chart, labs and discussed the procedure including the risks, benefits and alternatives for the proposed anesthesia with the patient or authorized representative who has indicated his/her understanding and acceptance.     Dental advisory given  Plan Discussed with:   Anesthesia Plan Comments:         Anesthesia Quick  Evaluation

## 2021-10-26 NOTE — Anesthesia Postprocedure Evaluation (Signed)
Anesthesia Post Note  Patient: Theodore King  Procedure(s) Performed: COLONOSCOPY WITH PROPOFOL POLYPECTOMY     Patient location during evaluation: PACU Anesthesia Type: MAC Level of consciousness: awake and alert Pain management: pain level controlled Vital Signs Assessment: post-procedure vital signs reviewed and stable Respiratory status: spontaneous breathing, nonlabored ventilation, respiratory function stable and patient connected to nasal cannula oxygen Cardiovascular status: stable and blood pressure returned to baseline Postop Assessment: no apparent nausea or vomiting Anesthetic complications: no   No notable events documented.  Last Vitals:  Vitals:   10/26/21 1610 10/26/21 1620  BP: (!) 167/40 (!) 161/48  Pulse: 61 65  Resp: 18 14  Temp:    SpO2: 100% 100%    Last Pain:  Vitals:   10/26/21 1620  TempSrc:   PainSc: 0-No pain                 Effie Berkshire

## 2021-10-26 NOTE — Op Note (Signed)
[facility] Patient Name: Theodore King Procedure Date: 10/26/2021 MRN: [id number] Attending MD: Docia Chuck. Henrene Pastor , MD Date of Birth: [birth year] CSN: [id number] Age: 76 Admit Type: Inpatient Procedure:                Colonoscopy with cold snare polypectomy x 1 Indications:              Abnormal CT of the GI tract. Question                            cecal/ileocecal mass. Previous colonoscopy 2014                            with polyps. Providers:                Docia Chuck. Henrene Pastor, MD, Burtis Junes, RN, [facility]                            Technician, Technician Referring MD:             [facility] hospitalist Medicines:                Monitored Anesthesia Care Complications:            No immediate complications. Estimated blood loss:                            None. Estimated Blood Loss:     Estimated blood loss: none. Procedure:                Pre-Anesthesia Assessment:                           - Prior to the procedure, a History and Physical                            was performed, and patient medications and                            allergies were reviewed. The patient's tolerance of                            previous anesthesia was also reviewed. The risks                            and benefits of the procedure and the sedation                            options and risks were discussed with the patient.                            All questions were answered, and informed consent                            was obtained. Prior Anticoagulants: The patient has  taken no previous anticoagulant or antiplatelet                            agents. ASA Grade Assessment: III - A patient with                            severe systemic disease. After reviewing the risks                            and benefits, the patient was deemed in                            satisfactory condition to undergo the procedure.                           After obtaining  informed consent, the colonoscope                            was passed under direct vision. Throughout the                            procedure, the patient's blood pressure, pulse, and                            oxygen saturations were monitored continuously. The                            CF-HQ190L ([id number] Olympus colonoscope was                            introduced through the anus and advanced to the the                            cecum, identified by appendiceal orifice and                            ileocecal valve. The ileocecal valve, appendiceal                            orifice, and rectum were photographed. The quality                            of the bowel preparation was excellent. The                            colonoscopy was performed without difficulty. The                            patient tolerated the procedure well. The bowel                            preparation used was MoviPrep via split dose  instruction. Scope In: 3:33:27 PM Scope Out: 3:53:53 PM Scope Withdrawal Time: 0 hours 15 minutes 46 seconds  Total Procedure Duration: 0 hours 20 minutes 26 seconds  Findings:      The terminal ileum was intubated for 10 to 15 cm and appeared normal.      A 5 mm polyp was found in the transverse colon. The polyp was removed       with a cold snare. Resection and retrieval were complete.      Multiple diverticula were found in the sigmoid colon.      The exam was otherwise without abnormality on direct and retroflexion       views. Impression:               - The examined portion of the ileum was normal. NO                            distal ileum or ileocecal mass.                           - One 5 mm polyp in the transverse colon, removed                            with a cold snare. Resected and retrieved.                           - Diverticulosis in the sigmoid colon.                           - The examination was otherwise normal on  direct                            and retroflexion views. NO cecal mass Moderate Sedation:      none Recommendation:           - Repeat colonoscopy is not recommended for                            surveillance.                           - Patient has a contact number available for                            emergencies. The signs and symptoms of potential                            delayed complications were discussed with the                            patient. Return to normal activities tomorrow.                            Written discharge instructions were provided to the                            patient.                           -  Resume previous diet.                           - Continue present medications.                           - Await pathology results.                           -Resume diet                           -Return to the care of primary service                           -No outpatient GI follow-up required                           GI will sign off                           Case discussed with patient. He was provided a copy                            of this report Procedure Code(s):        --- Professional ---                           [phone], Colonoscopy, flexible; with removal of                            tumor(s), polyp(s), or other lesion(s) by snare                            technique Diagnosis Code(s):        --- Professional ---                           K63.5, Polyp of colon                           K57.30, Diverticulosis of large intestine without                            perforation or abscess without bleeding                           R93.3, Abnormal findings on diagnostic imaging of                            other parts of digestive tract CPT copyright 2019 American Medical Association. All rights reserved. The codes documented in this report are preliminary and upon coder review may  be revised to meet current compliance  requirements. Docia Chuck. Henrene Pastor, MD 10/26/2021 4:06:36 PM This report has been signed electronically. Number of Addenda: 0

## 2021-10-27 ENCOUNTER — Encounter (HOSPITAL_COMMUNITY): Payer: Self-pay | Admitting: Internal Medicine

## 2021-10-27 DIAGNOSIS — N179 Acute kidney failure, unspecified: Secondary | ICD-10-CM | POA: Diagnosis not present

## 2021-10-27 LAB — COMPREHENSIVE METABOLIC PANEL
ALT: <5 U/L (ref 0–44)
AST: 10 U/L — ABNORMAL LOW (ref 15–41)
Albumin: 2.8 g/dL — ABNORMAL LOW (ref 3.5–5.0)
Alkaline Phosphatase: 47 U/L (ref 38–126)
Anion gap: 4 — ABNORMAL LOW (ref 5–15)
BUN: 9 mg/dL (ref 8–23)
CO2: 27 mmol/L (ref 22–32)
Calcium: 8.2 mg/dL — ABNORMAL LOW (ref 8.9–10.3)
Chloride: 107 mmol/L (ref 98–111)
Creatinine, Ser: 0.78 mg/dL (ref 0.61–1.24)
GFR, Estimated: >60 mL/min (ref >60)
Glucose, Bld: 127 mg/dL — ABNORMAL HIGH (ref 70–99)
Potassium: 3.4 mmol/L — ABNORMAL LOW (ref 3.5–5.1)
Sodium: 138 mmol/L (ref 135–145)
Total Bilirubin: 2.8 mg/dL — ABNORMAL HIGH (ref 0.3–1.2)
Total Protein: 5.6 g/dL — ABNORMAL LOW (ref 6.5–8.1)

## 2021-10-27 LAB — CBC
HCT: 32.7 % — ABNORMAL LOW (ref 39.0–52.0)
Hemoglobin: 11.2 g/dL — ABNORMAL LOW (ref 13.0–17.0)
MCH: 28.6 pg (ref 26.0–34.0)
MCHC: 34.3 g/dL (ref 30.0–36.0)
MCV: 83.4 fL (ref 80.0–100.0)
Platelets: 114 10*3/uL — ABNORMAL LOW (ref 150–400)
RBC: 3.92 MIL/uL — ABNORMAL LOW (ref 4.22–5.81)
RDW: 13.4 % (ref 11.5–15.5)
WBC: 8.7 10*3/uL (ref 4.0–10.5)
nRBC: 0 % (ref 0.0–0.2)

## 2021-10-27 MED ORDER — AMLODIPINE BESYLATE 5 MG PO TABS: 2.5000 mg | ORAL_TABLET | Freq: Every day | ORAL | 2 refills | Status: DC

## 2021-10-27 MED ORDER — POTASSIUM CHLORIDE CRYS ER 20 MEQ PO TBCR
40.0000 meq | EXTENDED_RELEASE_TABLET | Freq: Once | ORAL | Status: AC
  Administered 2021-10-27: 40 meq via ORAL
  Filled 2021-10-27: qty 2

## 2021-10-27 NOTE — Evaluation (Signed)
Physical Therapy Evaluation Patient Details Name: Theodore King MRN: [id number] DOB: 07/19/1945 Today's Date: 10/27/2021  History of Present Illness  Pt is a 76 y.o. male admitted with increasing generalized weakness abdominal pain and diarrhea.  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  small bowel obstruction. PMH significant for parkinson's disease, PNA due to COVID-19, vertigo, aortic atherosclerosis, Rt RTC repair.   Clinical Impression  Pt is a 76 y.o. male with above HPI resulting in the deficits listed below (see PT Problem List). Pt reports independence at baseline with intermittent use of SPC or RW- has history of knee pain. Pt required MOD A for bed mobility and repeated cuing throughout for sequencing of mobility. Pt performed sit to stand transfers with MIN A for power up, multiple attempts required before successful stand. Pt able to ambulate ~29f in room with MIN A and use of RW, noted unsteadiness on feet- pt reporting he has not been mobilizing much since hospital admission. Therapist discussed with pt's daughter via phone following session. Pt will have assist from  his daughter and son who he lives with (son is able to provide supervision while daughter is working). Pt will benefit from skilled PT to maximize functional mobility to increase independence. Discussed importance of continued OOB mobility for improvement in function with pt and RN.         Recommendations for follow up therapy are one component of a multi-disciplinary discharge planning process, led by the attending physician.  Recommendations may be updated based on patient status, additional functional criteria and insurance authorization.  Follow Up Recommendations Home health PT    Assistance Recommended at Discharge Frequent or constant Supervision/Assistance  Patient can return home with the following  A little help with walking and/or transfers;A little help with  bathing/dressing/bathroom;Assistance with cooking/housework;Assist for transportation;Help with stairs or ramp for entrance    Equipment Recommendations None recommended by PT (pt reports he owns RW)  Recommendations for Other Services       Functional Status Assessment Patient has had a recent decline in their functional status and demonstrates the ability to make significant improvements in function in a reasonable and predictable amount of time.     Precautions / Restrictions Precautions Precautions: Fall Restrictions Weight Bearing Restrictions: No      Mobility  Bed Mobility Overal bed mobility: Needs Assistance Bed Mobility: Supine to Sit     Supine to sit: Mod assist, HOB elevated     General bed mobility comments: Multimodal cues for sequencing of bed mobility and use of UEs to assist. increased time to scoot to EOB. elevated bed height to accomodate for pt's height.    Transfers Overall transfer level: Needs assistance Equipment used: Rolling walker (2 wheels) Transfers: Sit to/from Stand Sit to Stand: Min assist, From elevated surface           General transfer comment: MIN A with repeated cues for  hand placement. Multiple attempts to rise.    Ambulation/Gait Ambulation/Gait assistance: Min assist Gait Distance (Feet): 16 Feet Assistive device: Rolling walker (2 wheels) Gait Pattern/deviations: Step-to pattern, Decreased stride length Gait velocity: decreased     General Gait Details: Pt unsteady on feet reporting he has not been up out of bed/walking much since admission. Able to take lateral side steps toward chair and then ambulate forward 160fin room.  Stairs            Wheelchair Mobility    Modified Rankin (  Stroke Patients Only)       Balance Overall balance assessment: Needs assistance Sitting-balance support: Feet supported Sitting balance-Leahy Scale: Fair     Standing balance support: Bilateral upper extremity supported,  During functional activity, Reliant on assistive device for balance Standing balance-Leahy Scale: Poor                               Pertinent Vitals/Pain Pain Assessment Pain Assessment: No/denies pain    Home Living Family/patient expects to be discharged to:: Private residence Living Arrangements: Children (daughter and son) Available Help at Discharge: Family Type of Home: House Home Access: Stairs to enter Entrance Stairs-Rails: Psychiatric nurse of Steps: 3   Home Layout: One level Home Equipment: Conservation officer, nature (2 wheels);Cane - single point;Grab bars - tub/shower;BSC/3in1 Additional Comments: daughter works during day. Son assists while daughter at work.    Prior Function Prior Level of Function : Independent/Modified Independent             Mobility Comments: SPC at baseline when knee bothering him. Sometimes uses RW. Reports last fall couple weeks ago "leg gave way on me" daughter assisted pt up.       Hand Dominance        Extremity/Trunk Assessment   Upper Extremity Assessment Upper Extremity Assessment: Overall WFL for tasks assessed    Lower Extremity Assessment Lower Extremity Assessment: Generalized weakness    Cervical / Trunk Assessment Cervical / Trunk Assessment: Normal  Communication   Communication: No difficulties  Cognition Arousal/Alertness: Awake/alert Behavior During Therapy: WFL for tasks assessed/performed Overall Cognitive Status: Within Functional Limits for tasks assessed                                          General Comments      Exercises     Assessment/Plan    PT Assessment Patient needs continued PT services  PT Problem List Decreased strength;Decreased activity tolerance;Decreased balance;Decreased mobility;Decreased knowledge of use of DME       PT Treatment Interventions DME instruction;Gait training;Stair training;Functional mobility training;Therapeutic  activities;Therapeutic exercise;Balance training;Patient/family education    PT Goals (Current goals can be found in the Care Plan section)  Acute Rehab PT Goals Patient Stated Goal: Get moving better PT Goal Formulation: With patient/family Time For Goal Achievement: 11/10/21 Potential to Achieve Goals: Good    Frequency Min 3X/week     Co-evaluation               AM-PAC PT "6 Clicks" Mobility  Outcome Measure Help needed turning from your back to your side while in a flat bed without using bedrails?: A Little Help needed moving from lying on your back to sitting on the side of a flat bed without using bedrails?: A Lot Help needed moving to and from a bed to a chair (including a wheelchair)?: A Little Help needed standing up from a chair using your arms (e.g., wheelchair or bedside chair)?: A Little Help needed to walk in hospital room?: A Little Help needed climbing 3-5 steps with a railing? : A Lot 6 Click Score: 16    End of Session Equipment Utilized During Treatment: Gait belt Activity Tolerance: Patient tolerated treatment well Patient left: in chair;with call bell/phone within reach Nurse Communication: Mobility status PT Visit Diagnosis: Unsteadiness on feet (R26.81);Muscle weakness (generalized) (M62.81)  Time: 3419-3790 PT Time Calculation (min) (ACUTE ONLY): 25 min   Charges:   PT Evaluation $PT Eval Low Complexity: 1 Low PT Treatments $Therapeutic Activity: 8-22 mins       Festus Barren PT, DPT  Acute Rehabilitation Services  Office [phone]  10/27/2021, 1:20 PM

## 2021-10-27 NOTE — TOC Initial Note (Signed)
Transition of Care [facility]) - Initial/Assessment Note    Patient Details  Name: Theodore King MRN: [id number] Date of Birth: 08-Feb-1946  [facility] CM/SW Contact:    Lynnell Catalan, RN Phone Number: 10/27/2021, 2:40 PM  Clinical Narrative:                 Spoke with pt at bedside for dc planning. Pt states that he lives with his daughter and has 2 rolling walkers at home. He was offered choice for home health PT and chose [facility]. [facility] liaison contacted for referral.  Expected Discharge Plan: [place] Barriers to Discharge: No Barriers Identified   Patient Goals and CMS Choice Patient states their goals for this hospitalization and ongoing recovery are:: To go home CMS Medicare.gov Compare Post Acute Care list provided to:: Patient Choice offered to / list presented to : Patient  Expected Discharge Plan and Services Expected Discharge Plan: [place]   Discharge Planning Services: CM Consult Post Acute Care Choice: [place] arrangements for the past 2 months: Single Family Home Expected Discharge Date: 10/27/21                         HH Arranged: PT HH Agency: [place] Date [place]: 10/27/21 Time [place]: [place] Representative spoke with at [place]: [place] Arrangements/Services Living arrangements for the past 2 months: [place] with:: Adult Children Patient language and need for interpreter reviewed:: Yes Do you feel safe going back to the place where you live?: Yes      Need for Family Participation in Patient Care: Yes (Comment) Care giver support system in place?: Yes (comment)   Criminal Activity/Legal Involvement Pertinent to Current Situation/Hospitalization: No - Comment as needed  Activities of Daily Living Home Assistive Devices/Equipment: Eyeglasses, Environmental consultant (specify type), Cane (specify quad or straight), Shower chair with  back, Wheelchair (wears glasses at times) ADL Screening (condition at time of admission) Patient's cognitive ability adequate to safely complete daily activities?: No Is the patient deaf or have difficulty hearing?: No Does the patient have difficulty seeing, even when wearing glasses/contacts?: Yes (has trouble seeing at times) Does the patient have difficulty concentrating, remembering, or making decisions?: Yes (very slow talking and answering questions- hx parkinson's) Patient able to express need for assistance with ADLs?: Yes Does the patient have difficulty dressing or bathing?: Yes Independently performs ADLs?: No Communication: Independent Dressing (OT): Independent Grooming: Independent Feeding: Independent Bathing: Needs assistance Is this a change from baseline?: Pre-admission baseline Toileting: Needs assistance Is this a change from baseline?: Pre-admission baseline In/Out Bed: Needs assistance Is this a change from baseline?: Pre-admission baseline Walks in Home: Needs assistance Is this a change from baseline?: Pre-admission baseline Does the patient have difficulty walking or climbing stairs?: Yes Weakness of Legs: Both Weakness of Arms/Hands: Both  Permission Sought/Granted Permission sought to share information with : Facility Art therapist granted to share information with : Yes, Verbal Permission Granted     Permission granted to share info w AGENCY: [facility]        Emotional Assessment Appearance:: Appears stated age Attitude/Demeanor/Rapport: Gracious Affect (typically observed): Calm Orientation: : Oriented to Self, Oriented to Place, Oriented to Situation Alcohol / Substance Use: Not Applicable Psych Involvement: No (comment)  Admission diagnosis:  Parkinson disease ([place]) [G20] Weakness [R53.1] 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] AKI (acute kidney injury) ([place]) [N17.9]  Partial intestinal obstruction, unspecified cause ([place]) [K56.600] Patient  Active Problem List   Diagnosis Date Noted   Benign neoplasm of transverse colon    Abnormal CT of the abdomen    AKI (acute kidney injury) ([place]) 10/23/2021   Thrombocytopenia ([place]) 10/23/2021   BPH (benign prostatic hyperplasia) 10/23/2021   Hyperbilirubinemia 10/23/2021   Colonic mass 10/23/2021   Aortic atherosclerosis ([place]) 10/23/2021   History of 2019 novel coronavirus disease (COVID-19) 04/24/2019   Dysphonia 10/16/2018   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) [place] [address] Phone: [phone] Fax: [place], [place] [address] Phone: [phone] Fax: [phone]  [facility] [place], [place] [address] Phone: [phone] Fax: [phone]     Social Determinants of Health (SDOH) Interventions    Readmission Risk Interventions     View : No data to display.

## 2021-10-27 NOTE — Care Management Important Message (Signed)
Important Message  Patient Details IM Letter placed in Patients room. Name: Theodore King MRN: [id number] Date of Birth: 10-25-1945   Medicare Important Message Given:  Yes     Kerin Salen 10/27/2021, 11:00 AM

## 2021-10-27 NOTE — Discharge Summary (Signed)
Physician Discharge Summary  Theodore King ZOX:[id number] DOB: Dec 01, 1945 DOA: 10/23/2021  PCP: [name], MD  Admit date: 10/23/2021 Discharge date: 10/27/2021  Admitted From: Home Disposition: Home  Recommendations for Outpatient Follow-up:  Follow up with PCP in 1-2 weeks Please obtain BMP/CBC in one week Please follow up with GI  Home Health: Yes Equipment/Devices: None   Discharge Condition: Stable CODE STATUS: Full code Diet recommendation: Cardiac Brief/Interim Summary: 75 year old male admitted with increasing generalized weakness abdominal pain and diarrhea.  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  Urine was positive for ketones and hemoglobinuria.  His white count was 11.5.  KUB this morning shows dilated small bowel loops and NG tube has been ordered by GI.    Discharge Diagnoses:  Principal Problem:   AKI (acute kidney injury) ([place]) Active Problems:   Parkinson's disease ([place])   Hyperlipidemia   Thrombocytopenia (HCC)   BPH (benign prostatic hyperplasia)   Hyperbilirubinemia   Colonic mass   Aortic atherosclerosis ([facility])   Benign neoplasm of transverse colon   Abnormal CT of the abdomen     #1 cecal mass by CT scan-colonoscopy shows no evidence of cecal mass.  5 mm polyp in the transverse colon.  Diverticulosis in the sigmoid colon.  No cecal mass.   #2 acute urinary retention bladder scan was positive for over 900 cc of urine.  Foley catheter placed.  This was DC'd prior to discharge.   #3 small bowel obstruction -resolved patient was able to tolerate a diet prior to discharge.    #4 Parkinson's disease continue Sinemet   #5 acute kidney injury likely secondary to bladder outlet obstruction resolved with Foley placement.  #6 BPH continue Flomax   Nutrition Problem: Increased nutrient needs Etiology: acute illness    Signs/Symptoms: estimated needs     Interventions: Refer to RD note for recommendations, Boost  Breeze  Estimated body mass index is 23.71 kg/m as calculated from the following:   Height as of this encounter: 6' (1.829 m).   Weight as of this encounter: 79.3 kg.  Discharge Instructions  Discharge Instructions     Diet - low sodium heart healthy   Complete by: As directed    Increase activity slowly   Complete by: As directed       Allergies as of 10/27/2021   No Known Allergies      Medication List     TAKE these medications    acetaminophen 500 MG tablet Commonly known as: TYLENOL Take 500 mg by mouth every 6 (six) hours as needed for mild pain.   albuterol 108 (90 Base) MCG/ACT inhaler Commonly known as: VENTOLIN HFA Inhale 2 puffs into the lungs every 4 (four) hours as needed for wheezing or shortness of breath.   amLODipine 5 MG tablet Commonly known as: NORVASC Take 0.5 tablets (2.5 mg total) by mouth daily. Start taking on: Oct 28, 2021   carbidopa-levodopa 50-200 MG tablet Commonly known as: SINEMET CR Take 1 tablet by mouth at bedtime. What changed: Another medication with the same name was changed. Make sure you understand how and when to take each.   carbidopa-levodopa 25-100 MG tablet Commonly known as: SINEMET IR Take 2 tablets at 7 AM, 2 tablets at 10 AM, 2 tablets at 2 PM and 1 tablet at 6 PM What changed:  how much to take how to take this when to take this additional instructions   glycopyrrolate 1 MG tablet Commonly known  as: Robinul Take 1 tablet (1 mg total) by mouth 2 (two) times daily.   sertraline 25 MG tablet Commonly known as: ZOLOFT Take 1 tablet (25 mg total) by mouth daily.   tamsulosin 0.4 MG Caps capsule Commonly known as: FLOMAX Take 1 capsule (0.4 mg total) by mouth daily.   traZODone 50 MG tablet Commonly known as: DESYREL Take 50 mg by mouth at bedtime.        Follow-up Information     [name], MD Follow up.   Specialty: Internal Medicine Contact information: [place]  [id number] [phone]         [facility] Follow up.   Contact information: [place] [phone]               No Known Allergies  Consultations: GI   Procedures/Studies: CT ABDOMEN PELVIS WO CONTRAST  Result Date: 10/23/2021 CLINICAL DATA:  Abdominal pain with nausea and diarrhea. EXAM: CT ABDOMEN AND PELVIS WITHOUT CONTRAST TECHNIQUE: Multidetector CT imaging of the abdomen and pelvis was performed following the standard protocol without IV contrast. RADIATION DOSE REDUCTION: This exam was performed according to the departmental dose-optimization program which includes automated exposure control, adjustment of the mA and/or kV according to patient size and/or use of iterative reconstruction technique. COMPARISON:  CT chest abdomen pelvis dated August 22, 2020. FINDINGS: Lower chest: No acute abnormality. Hepatobiliary: No focal liver abnormality is seen. No gallstones, gallbladder wall thickening, or biliary dilatation. Pancreas: Unremarkable. No pancreatic ductal dilatation or surrounding inflammatory changes. Spleen: Normal in size without focal abnormality. Adrenals/Urinary Tract: Adrenal glands are unremarkable. Kidneys are normal, without renal calculi, focal lesion, or hydronephrosis. Bladder is unremarkable. Stomach/Bowel: The stomach is within normal limits. There are multiple dilated loops of small bowel with gradual transition to non-dilated terminal ileum. The cecum is mildly distended too, and there is questionable mass-like irregular wall thickening of the cecum distal to the ileocecal valve (series 6, image 37). The remaining colon distal to this area is mostly decompressed. History of prior appendectomy. Vascular/Lymphatic: Aortic atherosclerosis. No enlarged abdominal or pelvic lymph nodes. Reproductive: Unchanged prostate median lobe hypertrophy indenting the bladder base. Other: No abdominal wall hernia or  abnormality. Trace ascites. No pneumoperitoneum. Musculoskeletal: No acute or significant osseous findings. Mild anasarca. IMPRESSION: 1. Questionable focal mass-like irregular wall thickening of the cecum beyond the ileocecal valve with mild distention of the cecum and diffusely dilated small bowel with gradual transition to non-dilated terminal ileum. 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 Recommend colonoscopy for further evaluation. 2. Aortic Atherosclerosis (ICD10-I70.0). Electronically Signed   By: Titus Dubin M.D.   On: 10/23/2021 14:26   DG Abd 1 View  Result Date: 10/25/2021 CLINICAL DATA:  75 year old male with history of intractable nausea and vomiting. EXAM: ABDOMEN - 1 VIEW COMPARISON:  Abdominal radiograph 10/24/2021. FINDINGS: Nasogastric tube has been removed. Several prominent but nondilated loops of gas-filled small bowel are noted throughout the central abdomen measuring up to 2.6 cm in diameter. Gas and stool is also noted throughout the colon and distal rectum. No pneumoperitoneum. IMPRESSION: 1. Nonspecific, nonobstructive bowel gas pattern, as above. 2. No pneumoperitoneum. Electronically Signed   By: Vinnie Langton M.D.   On: 10/25/2021 07:46   DG Abd 1 View  Result Date: 10/24/2021 CLINICAL DATA:  NG tube placement. EXAM: ABDOMEN - 1 VIEW COMPARISON:  Abdominal x-ray 10/24/2021 FINDINGS: Nasogastric tube tip is in the gastric fundus.  Dilated small bowel loops are again seen diffusely. Degree of distention has mildly decreased. IMPRESSION: 1. Nasogastric tube tip in the gastric fundus. 2. Dilated small bowel loops again seen, mildly decreased in size. Electronically Signed   By: Ronney Asters M.D.   On: 10/24/2021 23:08   DG Abd 2 Views  Result Date: 10/24/2021 CLINICAL DATA:  Small-bowel obstruction EXAM: ABDOMEN - 2 VIEW COMPARISON:  None Available. FINDINGS: Air-filled mildly dilated loops of small bowel. No free air. No acute osseous abnormality.  IMPRESSION: Air-filled, mildly dilated loops of small bowel may reflect distal small bowel obstruction. Electronically Signed   By: Macy Mis M.D.   On: 10/24/2021 12:02   (Echo, Carotid, EGD, Colonoscopy, ERCP)    Subjective:  Patient resting in bed eating breakfast no complaints Discharge Exam: Vitals:   10/27/21 0455 10/27/21 1307  BP: (!) 132/59 (!) 141/61  Pulse: 66 70  Resp: 14 (!) 22  Temp: 99.7 F (37.6 C) 98.7 F (37.1 C)  SpO2: 98% 100%   Vitals:   10/26/21 1620 10/26/21 2042 10/27/21 0455 10/27/21 1307  BP: (!) 161/48 (!) 153/68 (!) 132/59 (!) 141/61  Pulse: 65 72 66 70  Resp: '14 14 14 '$ (!) 22  Temp:  (!) 101.1 F (38.4 C) 99.7 F (37.6 C) 98.7 F (37.1 C)  TempSrc:  Oral Oral Oral  SpO2: 100% 99% 98% 100%  Weight:      Height:        General: Pt is alert, awake, not in acute distress Cardiovascular: RRR, S1/S2 +, no rubs, no gallops Respiratory: CTA bilaterally, no wheezing, no rhonchi Abdominal: Soft, NT, ND, bowel sounds + Extremities: no edema, no cyanosis    The results of significant diagnostics from this hospitalization (including imaging, microbiology, ancillary and laboratory) are listed below for reference.     Microbiology: No results found for this or any previous visit (from the past 240 hour(s)).   Labs: BNP (last 3 results) Recent Labs    05/27/21 2326  BNP 63.8   Basic Metabolic Panel: Recent Labs  Lab 10/23/21 1136 10/24/21 1601 10/26/21 0934 10/27/21 0711  NA 142 143 140 138  K 4.4 3.9 3.3* 3.4*  CL 108 113* 110 107  CO2 '24 28 26 27  '$ GLUCOSE 139* 96 105* 127*  BUN 30* '20 11 9  '$ CREATININE 1.63* 1.06 0.86 0.78  CALCIUM 9.5 8.6* 8.2* 8.2*  MG  --   --  1.6*  --    Liver Function Tests: Recent Labs  Lab 10/23/21 1136 10/24/21 1601 10/26/21 0934 10/27/21 0711  AST 17 13* 13* 10*  ALT '7 6 6 '$ <5  ALKPHOS 61 45 40 47  BILITOT 3.1* 3.2* 2.6* 2.8*  PROT 7.8 6.0* 5.6* 5.6*  ALBUMIN 4.3 3.4* 2.9* 2.8*   Recent  Labs  Lab 10/23/21 1136  LIPASE 28   No results for input(s): AMMONIA in the last 168 hours. CBC: Recent Labs  Lab 10/23/21 1136 10/24/21 1601 10/26/21 0934 10/27/21 0711  WBC 11.5* 6.1 6.6 8.7  NEUTROABS 9.3*  --   --   --   HGB 13.5 11.7* 10.8* 11.2*  HCT 41.3 36.5* 31.4* 32.7*  MCV 86.2 86.5 84.0 83.4  PLT 136* 105* 108* 114*   Cardiac Enzymes: No results for input(s): CKTOTAL, CKMB, CKMBINDEX, TROPONINI in the last 168 hours. BNP: Invalid input(s): POCBNP CBG: No results for input(s): GLUCAP in the last 168 hours. D-Dimer No results for input(s): DDIMER in the last 72 hours. Hgb  A1c No results for input(s): HGBA1C in the last 72 hours. Lipid Profile No results for input(s): CHOL, HDL, LDLCALC, TRIG, CHOLHDL, LDLDIRECT in the last 72 hours. Thyroid function studies No results for input(s): TSH, T4TOTAL, T3FREE, THYROIDAB in the last 72 hours.  Invalid input(s): FREET3 Anemia work up No results for input(s): VITAMINB12, FOLATE, FERRITIN, TIBC, IRON, RETICCTPCT in the last 72 hours. Urinalysis    Component Value Date/Time   COLORURINE AMBER (A) 10/23/2021 1317   APPEARANCEUR CLEAR 10/23/2021 1317   LABSPEC 1.018 10/23/2021 1317   PHURINE 5.0 10/23/2021 1317   GLUCOSEU NEGATIVE 10/23/2021 1317   HGBUR SMALL (A) 10/23/2021 1317   BILIRUBINUR NEGATIVE 10/23/2021 1317   KETONESUR 5 (A) 10/23/2021 1317   PROTEINUR NEGATIVE 10/23/2021 1317   UROBILINOGEN 1.0 04/16/2020 2048   NITRITE NEGATIVE 10/23/2021 1317   LEUKOCYTESUR NEGATIVE 10/23/2021 1317   Sepsis Labs Invalid input(s): PROCALCITONIN,  WBC,  LACTICIDVEN Microbiology No results found for this or any previous visit (from the past 240 hour(s)).   Time coordinating discharge: 35 minutes  SIGNED:   Georgette Shell, MD  [facility] 10/27/2021, 3:30 PM

## 2021-10-28 LAB — SURGICAL PATHOLOGY

## 2021-11-09 ENCOUNTER — Inpatient Hospital Stay (HOSPITAL_COMMUNITY)
Admission: EM | Admit: 2021-11-09 | Discharge: 2021-11-12 | DRG: 699 | Disposition: A | Discharge status: Home Health | Payer: Medicare (Managed Care) | Attending: Internal Medicine | Admitting: Internal Medicine

## 2021-11-09 ENCOUNTER — Emergency Department (HOSPITAL_COMMUNITY): Payer: Medicare (Managed Care)

## 2021-11-09 ENCOUNTER — Other Ambulatory Visit: Payer: Self-pay

## 2021-11-09 ENCOUNTER — Encounter (HOSPITAL_COMMUNITY): Payer: Self-pay

## 2021-11-09 DIAGNOSIS — N400 Enlarged prostate without lower urinary tract symptoms: Secondary | ICD-10-CM | POA: Diagnosis present

## 2021-11-09 DIAGNOSIS — T83511A Infection and inflammatory reaction due to indwelling urethral catheter, initial encounter: Secondary | ICD-10-CM | POA: Diagnosis not present

## 2021-11-09 DIAGNOSIS — N390 Urinary tract infection, site not specified: Secondary | ICD-10-CM

## 2021-11-09 DIAGNOSIS — E785 Hyperlipidemia, unspecified: Secondary | ICD-10-CM | POA: Diagnosis present

## 2021-11-09 DIAGNOSIS — G4752 REM sleep behavior disorder: Secondary | ICD-10-CM | POA: Diagnosis present

## 2021-11-09 DIAGNOSIS — D649 Anemia, unspecified: Secondary | ICD-10-CM | POA: Diagnosis present

## 2021-11-09 DIAGNOSIS — Z8616 Personal history of COVID-19: Secondary | ICD-10-CM

## 2021-11-09 DIAGNOSIS — R319 Hematuria, unspecified: Secondary | ICD-10-CM | POA: Diagnosis present

## 2021-11-09 DIAGNOSIS — R338 Other retention of urine: Secondary | ICD-10-CM | POA: Diagnosis present

## 2021-11-09 DIAGNOSIS — Z87891 Personal history of nicotine dependence: Secondary | ICD-10-CM

## 2021-11-09 DIAGNOSIS — Z79899 Other long term (current) drug therapy: Secondary | ICD-10-CM

## 2021-11-09 DIAGNOSIS — R410 Disorientation, unspecified: Secondary | ICD-10-CM | POA: Diagnosis present

## 2021-11-09 DIAGNOSIS — E876 Hypokalemia: Secondary | ICD-10-CM | POA: Diagnosis not present

## 2021-11-09 DIAGNOSIS — G20 Parkinson's disease: Secondary | ICD-10-CM | POA: Diagnosis present

## 2021-11-09 DIAGNOSIS — R7881 Bacteremia: Secondary | ICD-10-CM | POA: Diagnosis present

## 2021-11-09 DIAGNOSIS — N179 Acute kidney failure, unspecified: Secondary | ICD-10-CM | POA: Diagnosis not present

## 2021-11-09 DIAGNOSIS — I10 Essential (primary) hypertension: Secondary | ICD-10-CM

## 2021-11-09 DIAGNOSIS — Y846 Urinary catheterization as the cause of abnormal reaction of the patient, or of later complication, without mention of misadventure at the time of the procedure: Secondary | ICD-10-CM | POA: Diagnosis present

## 2021-11-09 DIAGNOSIS — B9620 Unspecified Escherichia coli [E. coli] as the cause of diseases classified elsewhere: Secondary | ICD-10-CM | POA: Diagnosis present

## 2021-11-09 DIAGNOSIS — G20A1 Parkinson's disease without dyskinesia, without mention of fluctuations: Secondary | ICD-10-CM | POA: Diagnosis present

## 2021-11-09 LAB — CBC WITH DIFFERENTIAL/PLATELET
Abs Immature Granulocytes: 0.13 10*3/uL — ABNORMAL HIGH (ref 0.00–0.07)
Basophils Absolute: 0 10*3/uL (ref 0.0–0.1)
Basophils Relative: 0 %
Eosinophils Absolute: 0 10*3/uL (ref 0.0–0.5)
Eosinophils Relative: 0 %
HCT: 35.4 % — ABNORMAL LOW (ref 39.0–52.0)
Hemoglobin: 11.4 g/dL — ABNORMAL LOW (ref 13.0–17.0)
Immature Granulocytes: 1 %
Lymphocytes Relative: 4 %
Lymphs Abs: 0.7 10*3/uL (ref 0.7–4.0)
MCH: 27.1 pg (ref 26.0–34.0)
MCHC: 32.2 g/dL (ref 30.0–36.0)
MCV: 84.3 fL (ref 80.0–100.0)
Monocytes Absolute: 0.7 10*3/uL (ref 0.1–1.0)
Monocytes Relative: 4 %
Neutro Abs: 14.5 10*3/uL — ABNORMAL HIGH (ref 1.7–7.7)
Neutrophils Relative %: 91 %
Platelets: 299 10*3/uL (ref 150–400)
RBC: 4.2 MIL/uL — ABNORMAL LOW (ref 4.22–5.81)
RDW: 14.4 % (ref 11.5–15.5)
WBC: 15.9 10*3/uL — ABNORMAL HIGH (ref 4.0–10.5)
nRBC: 0 % (ref 0.0–0.2)

## 2021-11-09 LAB — URINALYSIS, ROUTINE W REFLEX MICROSCOPIC
Bilirubin Urine: NEGATIVE
Glucose, UA: NEGATIVE mg/dL
Ketones, ur: 5 mg/dL — AB
Nitrite: POSITIVE — AB
Protein, ur: 30 mg/dL — AB
RBC / HPF: >50 RBC/hpf — ABNORMAL HIGH (ref 0–5)
Specific Gravity, Urine: 1.009 (ref 1.005–1.030)
pH: 6 (ref 5.0–8.0)

## 2021-11-09 LAB — I-STAT CHEM 8, ED
BUN: 17 mg/dL (ref 8–23)
Calcium, Ion: 0.99 mmol/L — ABNORMAL LOW (ref 1.15–1.40)
Chloride: 103 mmol/L (ref 98–111)
Creatinine, Ser: 1.4 mg/dL — ABNORMAL HIGH (ref 0.61–1.24)
Glucose, Bld: 125 mg/dL — ABNORMAL HIGH (ref 70–99)
HCT: 33 % — ABNORMAL LOW (ref 39.0–52.0)
Hemoglobin: 11.2 g/dL — ABNORMAL LOW (ref 13.0–17.0)
Potassium: 3 mmol/L — ABNORMAL LOW (ref 3.5–5.1)
Sodium: 143 mmol/L (ref 135–145)
TCO2: 28 mmol/L (ref 22–32)

## 2021-11-09 LAB — LACTIC ACID, PLASMA: Lactic Acid, Venous: 1.9 mmol/L (ref 0.5–1.9)

## 2021-11-09 LAB — COMPREHENSIVE METABOLIC PANEL
ALT: 11 U/L (ref 0–44)
AST: 14 U/L — ABNORMAL LOW (ref 15–41)
Albumin: 2 g/dL — ABNORMAL LOW (ref 3.5–5.0)
Alkaline Phosphatase: 68 U/L (ref 38–126)
Anion gap: 7 (ref 5–15)
BUN: 16 mg/dL (ref 8–23)
CO2: 24 mmol/L (ref 22–32)
Calcium: 7 mg/dL — ABNORMAL LOW (ref 8.9–10.3)
Chloride: 112 mmol/L — ABNORMAL HIGH (ref 98–111)
Creatinine, Ser: 1.13 mg/dL (ref 0.61–1.24)
GFR, Estimated: >60 mL/min (ref >60)
Glucose, Bld: 163 mg/dL — ABNORMAL HIGH (ref 70–99)
Potassium: 3.4 mmol/L — ABNORMAL LOW (ref 3.5–5.1)
Sodium: 143 mmol/L (ref 135–145)
Total Bilirubin: 1.2 mg/dL (ref 0.3–1.2)
Total Protein: 5.2 g/dL — ABNORMAL LOW (ref 6.5–8.1)

## 2021-11-09 LAB — PHOSPHORUS: Phosphorus: 2.2 mg/dL — ABNORMAL LOW (ref 2.5–4.6)

## 2021-11-09 LAB — MAGNESIUM: Magnesium: 2 mg/dL (ref 1.7–2.4)

## 2021-11-09 MED ORDER — SODIUM CHLORIDE 0.9 % IV SOLN
INTRAVENOUS | Status: AC
  Filled 2021-11-09: qty 10

## 2021-11-09 MED ORDER — SODIUM CHLORIDE 0.9 % IV BOLUS
1000.0000 mL | Freq: Once | INTRAVENOUS | Status: AC
  Administered 2021-11-09: 1000 mL via INTRAVENOUS

## 2021-11-09 MED ORDER — ACETAMINOPHEN 325 MG PO TABS: 650.0000 mg | ORAL_TABLET | Freq: Four times a day (QID) | ORAL | Status: DC | PRN

## 2021-11-09 MED ORDER — TAMSULOSIN HCL 0.4 MG PO CAPS
0.4000 mg | ORAL_CAPSULE | Freq: Every day | ORAL | Status: DC
  Administered 2021-11-09 – 2021-11-12 (×4): 0.4 mg via ORAL
  Filled 2021-11-09 (×4): qty 1

## 2021-11-09 MED ORDER — SERTRALINE HCL 25 MG PO TABS
25.0000 mg | ORAL_TABLET | Freq: Every day | ORAL | Status: DC
  Administered 2021-11-09 – 2021-11-12 (×4): 25 mg via ORAL
  Filled 2021-11-09 (×4): qty 1

## 2021-11-09 MED ORDER — TRAZODONE HCL 50 MG PO TABS: 50.0000 mg | ORAL_TABLET | Freq: Every evening | ORAL | Status: DC | PRN

## 2021-11-09 MED ORDER — CARBIDOPA-LEVODOPA 25-100 MG PO TABS
1.0000 | ORAL_TABLET | Freq: Every day | ORAL | Status: DC
  Administered 2021-11-11: 1 via ORAL
  Filled 2021-11-09 (×3): qty 1

## 2021-11-09 MED ORDER — MAGNESIUM SULFATE 2 GM/50ML IV SOLN
2.0000 g | Freq: Once | INTRAVENOUS | Status: AC
  Administered 2021-11-09: 2 g via INTRAVENOUS
  Filled 2021-11-09: qty 50

## 2021-11-09 MED ORDER — AMLODIPINE BESYLATE 5 MG PO TABS
2.5000 mg | ORAL_TABLET | Freq: Every day | ORAL | Status: DC
  Administered 2021-11-10 – 2021-11-12 (×3): 2.5 mg via ORAL
  Filled 2021-11-09 (×3): qty 1

## 2021-11-09 MED ORDER — POTASSIUM CHLORIDE 10 MEQ/100ML IV SOLN
INTRAVENOUS | Status: AC
  Filled 2021-11-09: qty 100

## 2021-11-09 MED ORDER — AZITHROMYCIN 250 MG PO TABS
ORAL_TABLET | ORAL | Status: AC
  Filled 2021-11-09: qty 2

## 2021-11-09 MED ORDER — GLYCOPYRROLATE 1 MG PO TABS
1.0000 mg | ORAL_TABLET | Freq: Two times a day (BID) | ORAL | Status: DC
  Administered 2021-11-09 – 2021-11-12 (×6): 1 mg via ORAL
  Filled 2021-11-09 (×6): qty 1

## 2021-11-09 MED ORDER — CARBIDOPA-LEVODOPA 25-100 MG PO TABS
2.0000 | ORAL_TABLET | ORAL | Status: DC
  Administered 2021-11-10 – 2021-11-12 (×9): 2 via ORAL
  Filled 2021-11-09 (×8): qty 2

## 2021-11-09 MED ORDER — SODIUM CHLORIDE 0.9 % IV SOLN
1.0000 g | Freq: Once | INTRAVENOUS | Status: AC
  Administered 2021-11-09: 1 g via INTRAVENOUS

## 2021-11-09 MED ORDER — SODIUM CHLORIDE 0.9 % IV SOLN
INTRAVENOUS | Status: AC
Start: 2021-11-09 — End: 2021-11-09

## 2021-11-09 MED ORDER — CARBIDOPA-LEVODOPA ER 50-200 MG PO TBCR
1.0000 | EXTENDED_RELEASE_TABLET | Freq: Every day | ORAL | Status: DC
  Administered 2021-11-09 – 2021-11-11 (×3): 1 via ORAL
  Filled 2021-11-09 (×4): qty 1

## 2021-11-09 MED ORDER — SODIUM CHLORIDE 0.9 % IV SOLN: INTRAVENOUS | Status: DC

## 2021-11-09 MED ORDER — CARBIDOPA-LEVODOPA 25-100 MG PO TABS: 1.0000 | ORAL_TABLET | ORAL | Status: DC

## 2021-11-09 MED ORDER — POTASSIUM CHLORIDE CRYS ER 20 MEQ PO TBCR
EXTENDED_RELEASE_TABLET | ORAL | Status: AC
  Filled 2021-11-09: qty 2

## 2021-11-09 MED ORDER — AZITHROMYCIN 250 MG PO TABS
500.0000 mg | ORAL_TABLET | Freq: Once | ORAL | Status: AC
  Administered 2021-11-09: 500 mg via ORAL

## 2021-11-09 MED ORDER — POTASSIUM CHLORIDE 10 MEQ/100ML IV SOLN
INTRAVENOUS | Status: AC
  Administered 2021-11-09: 10 meq via INTRAVENOUS
  Filled 2021-11-09: qty 100

## 2021-11-09 MED ORDER — POTASSIUM CHLORIDE 10 MEQ/100ML IV SOLN
10.0000 meq | INTRAVENOUS | Status: AC
  Administered 2021-11-09: 10 meq via INTRAVENOUS

## 2021-11-09 MED ORDER — POTASSIUM CHLORIDE CRYS ER 20 MEQ PO TBCR
40.0000 meq | EXTENDED_RELEASE_TABLET | Freq: Once | ORAL | Status: AC
  Administered 2021-11-09: 40 meq via ORAL

## 2021-11-09 MED ORDER — ACETAMINOPHEN 650 MG RE SUPP: 650.0000 mg | Freq: Four times a day (QID) | RECTAL | Status: DC | PRN

## 2021-11-09 MED ORDER — PROCHLORPERAZINE EDISYLATE 10 MG/2ML IJ SOLN: 5.0000 mg | INTRAMUSCULAR | Status: DC | PRN

## 2021-11-09 NOTE — ED Triage Notes (Signed)
Pt BIBA from home, pulled out foley catheter d/t discomfort. Bulb still inflated, some blood noted on tube but not actively bleeding. Pt c/o some penile or abdominal discomfort prior to pulling out, difficult to clarify. Oriented to baseline, a&ox3. Pt /co some SHOB at home so EMS gave regular albuterol treatment. 20ga RAC 250cc NS  112/54 increased to 140/60 HR 72 100% RA CBG 140

## 2021-11-09 NOTE — H&P (Signed)
History and Physical    Patient: Theodore King OAC:[id number] DOB: 04-11-1946 DOA: 11/09/2021 DOS: the patient was seen and examined on 11/09/2021 PCP: [name], MD  Patient coming from: Home  Chief Complaint: No chief complaint on file.  HPI: Theodore King is a 76 y.o. male with medical history significant of abnormal drainage, aortic atherosclerosis, BPH, bursitis and tendinitis of the shoulder, hyperglycemia, hyperlipidemia, toxic neuropathy, Parkinson's disease, COVID-19 pneumonia, REM sleep behavior disorder, dyspnea, vitamin D deficiency, benign paroxysmal vertigo who was admitted on 5/19 until 5/23 last month and is returning to the emergency department after pulling his Foley catheter out.  According to EMS, there was some blood on the tube, but no active bleeding.  He knows he is in the hospital, but he is disoriented to time, date and situation.  I called his daughter who stated, that he got up on his own to go to the bathroom despite that they have asked him to get assistance from her or from his son who is also helping with his care.  He was supposed to have an appointment for home health today.  He was supposed to have a catheter removed other urologic clinic on Wednesday.  He is able to answer some simple questions.  He denied headache, chest, back or abdominal pain at this time.  Foley catheter bag with mildly hematuric urine.  ED course: Initial vital signs were temperature 97.7 F, pulse 72, respirations 22, BP 141/57 mmHg O2 sat 100% on room air.  He received Zithromax 500 mg p.o., ceftriaxone 1 g IVPB, potassium chloride 10 mEq x 2, KCl 40 mEq p.o. and 1000 mL of NS bolus.  I added magnesium sulfate 2 g IVPB.  Lab work: Urinalysis with large hemoglobinuria, ketonuria 5 and proteinuria 30 mg/dL.  Positive nitrites, moderate leukocyte esterase, more than 50 RBC, 21-50 WBC per hpf, many bacteria with presence of WBC clumps.  CBC showed a white count of 15.9, hemoglobin 11.4  g/dL platelets 299.  I-STAT Chem-8 showed a potassium of 3.0 mmol/L, creatinine 1.4, glucose 125 mg/dL.  Lactic acid was normal.  Imaging: 2 view chest radiograph shows cardiomegaly with mild pulmonary edema.  Review of Systems: As mentioned in the history of present illness. All other systems reviewed and are negative. 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)    Paralysis agitans ([place]) 05/18/2013   Parkinson's disease ([place])    Pneumonia due to COVID-19 virus 04/08/2019   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   SOB (shortness of breath) 03/23/2018   Unspecified vitamin D deficiency    Unspecified vitamin D deficiency    Vertigo, benign paroxysmal 11/05/2016   Vitamin D deficiency 05/18/2013   Past Surgical History:  Procedure Laterality Date   APPENDECTOMY     COLONOSCOPY WITH PROPOFOL N/A 10/26/2021   Procedure: COLONOSCOPY WITH PROPOFOL;  Surgeon: Irene Shipper, MD;  Location: [facility];  Service: Gastroenterology;  Laterality: N/A;   POLYPECTOMY  10/26/2021   Procedure: POLYPECTOMY;  Surgeon: Irene Shipper, MD;  Location: [facility];  Service: Gastroenterology;;   Cherre Robins CUFF REPAIR     right   Social  History:  reports that he quit smoking about 47 years ago. His smoking use included cigarettes. He started smoking about 68 years ago. He has a 10.00 pack-year smoking history. He has never used smokeless tobacco. He reports that he does not currently use alcohol. He reports that he does not use drugs.  No Known Allergies  Family History  Problem Relation Age of Onset   Cancer Mother    Cancer  Father    Stroke Brother    Cancer Sister    Healthy Daughter    Cancer Sister    Lung cancer Brother    Healthy Daughter    Colon cancer Neg Hx    Esophageal cancer Neg Hx    Rectal cancer Neg Hx    Stomach cancer Neg Hx     Prior to Admission medications   Medication Sig Start Date End Date Taking? Authorizing Provider  acetaminophen (TYLENOL) 500 MG tablet Take 500 mg by mouth every 6 (six) hours as needed for mild pain.    [provider]  albuterol (VENTOLIN HFA) 108 (90 Base) MCG/ACT inhaler Inhale 2 puffs into the lungs every 4 (four) hours as needed for wheezing or shortness of breath.  10/03/18   [provider]  amLODipine (NORVASC) 5 MG tablet Take 0.5 tablets (2.5 mg total) by mouth daily. 10/28/21   Georgette Shell, MD  carbidopa-levodopa (SINEMET CR) 50-200 MG tablet Take 1 tablet by mouth at bedtime. 09/24/21   Penumalli, Earlean Polka, MD  carbidopa-levodopa (SINEMET IR) 25-100 MG tablet Take 2 tablets at 7 AM, 2 tablets at 10 AM, 2 tablets at 2 PM and 1 tablet at 6 PM Patient taking differently: Take 1-2 tablets by mouth See admin instructions. Take 2 tablets by mouth at 7 AM, then take 2 tablets by mouth at 10 AM, take 2 tablets by mouth at 2 PM and 1 tablet at at 6 PM, then 1 tablet at by mouth at bedtime per daughter 09/24/21   Penni Bombard, MD  glycopyrrolate (ROBINUL) 1 MG tablet Take 1 tablet (1 mg total) by mouth 2 (two) times daily. 09/24/21   Penumalli, Earlean Polka, MD  sertraline (ZOLOFT) 25 MG tablet Take 1 tablet (25 mg total) by mouth daily. 09/24/21   Penumalli, Earlean Polka, MD  tamsulosin (FLOMAX) 0.4 MG CAPS capsule Take 1 capsule (0.4 mg total) by mouth daily. 04/16/20   Vanessa Kick, MD  traZODone (DESYREL) 50 MG tablet Take 50 mg by mouth at bedtime. 08/17/20   [provider]    Physical Exam: Vitals:   11/09/21 0915 11/09/21 1000 11/09/21 1015 11/09/21 1133  BP: (!) 158/65 (!) 159/60 (!) 162/64 (!) 179/64  Pulse: 71 66 87 70   Resp: 13 (!) 24 20 (!) 22  Temp:      TempSrc:      SpO2: 100% 94% 95% 100%   Physical Exam Constitutional:      General: He is awake. He is not in acute distress.    Appearance: Normal appearance.  HENT:     Head: Normocephalic.     Mouth/Throat:     Mouth: Mucous membranes are dry.  Eyes:     General: No scleral icterus.    Pupils: Pupils are equal, round, and reactive to light.  Neck:     Vascular: No JVD.  Cardiovascular:     Rate and Rhythm: Normal rate and regular rhythm.     Heart sounds: S1 normal and S2 normal.  Pulmonary:  Effort: Pulmonary effort is normal.     Breath sounds: No wheezing, rhonchi or rales.  Abdominal:     General: Bowel sounds are normal. There is no distension.     Palpations: Abdomen is soft.     Tenderness: There is no abdominal tenderness. There is no right CVA tenderness, left CVA tenderness, guarding or rebound.  Musculoskeletal:     Cervical back: Neck supple.     Right lower leg: No edema.     Left lower leg: No edema.  Skin:    General: Skin is warm and dry.  Neurological:     General: No focal deficit present.     Mental Status: He is alert. He is disoriented.  Psychiatric:        Mood and Affect: Mood normal.        Behavior: Behavior normal.   Data Reviewed:  Results are pending, will review when available.  Assessment and Plan: Principal Problem:   AKI (acute kidney injury) ([place]) Observation/telemetry. Continue IV fluids. Avoid hypotension. Avoid nephrotoxins. Monitor intake and output. Monitor renal function electrolytes.  Active Problems:   Hypokalemia Replacing.  Magnesium was supplemented. Follow-up potassium level in in AM.    Normocytic anemia Monitor hematocrit and hemoglobin.    Parkinson's disease ([place]) Continue Sinemet IR 4 times a day. Continue Sinemet CR at bedtime.    BPH (benign prostatic hyperplasia) Continue tamsulosin 0.4 mg p.o. daily.     Advance Care Planning:   Code Status: Full  Code   Consults:   Family Communication: I called and was able to speak to his daughter Seville Downs.  Severity of Illness: The appropriate patient status for this patient is OBSERVATION. Observation status is judged to be reasonable and necessary in order to provide the required intensity of service to ensure the patient's safety. The patient's presenting symptoms, physical exam findings, and initial radiographic and laboratory data in the context of their medical condition is felt to place them at decreased risk for further clinical deterioration. Furthermore, it is anticipated that the patient will be medically stable for discharge from the hospital within 2 midnights of admission.   Author: Reubin Milan, MD 11/09/2021 12:55 PM  For on call review [email or web address].   This document was prepared using [employer] and may contain some unintended transcription errors.

## 2021-11-09 NOTE — ED Provider Notes (Signed)
[facility] DEPT Provider Note   CSN: [id number] Arrival date & time: 11/09/21  0747     History  No chief complaint on file.   Theodore King is a 76 y.o. male.  The history is provided by the patient, the EMS personnel and medical records. No language interpreter was used.   76 year old male here for management of Foley cath dislodgment.  Patient has history of Parkinson's disease, paralysis agitans, brought here via EMS from home.  History obtained through EMS and family member.  Pulled out his Foley catheter this morning.  He is a poor historian however he states he has had lower abdominal discomfort for the past 3 weeks, was irritated and decided to pull out his Foley catheter.  EMS noticed blood at the tip of the penis with the Foley catheter with the bulb still inflated.  Initially was noted to have some shortness of breath so EMS gave patient albuterol treatment.  The patient denies having chest pain, trouble breathing, coughing, wheezing, nausea vomiting.  Not on blood thinner   Home Medications Prior to Admission medications   Medication Sig Start Date End Date Taking? Authorizing Provider  acetaminophen (TYLENOL) 500 MG tablet Take 500 mg by mouth every 6 (six) hours as needed for mild pain.    [provider]  albuterol (VENTOLIN HFA) 108 (90 Base) MCG/ACT inhaler Inhale 2 puffs into the lungs every 4 (four) hours as needed for wheezing or shortness of breath.  10/03/18   [provider]  amLODipine (NORVASC) 5 MG tablet Take 0.5 tablets (2.5 mg total) by mouth daily. 10/28/21   Georgette Shell, MD  carbidopa-levodopa (SINEMET CR) 50-200 MG tablet Take 1 tablet by mouth at bedtime. 09/24/21   Penumalli, Earlean Polka, MD  carbidopa-levodopa (SINEMET IR) 25-100 MG tablet Take 2 tablets at 7 AM, 2 tablets at 10 AM, 2 tablets at 2 PM and 1 tablet at 6 PM Patient taking differently: Take 1-2 tablets by mouth See admin instructions.  Take 2 tablets by mouth at 7 AM, then take 2 tablets by mouth at 10 AM, take 2 tablets by mouth at 2 PM and 1 tablet at at 6 PM, then 1 tablet at by mouth at bedtime per daughter 09/24/21   Penni Bombard, MD  glycopyrrolate (ROBINUL) 1 MG tablet Take 1 tablet (1 mg total) by mouth 2 (two) times daily. 09/24/21   Penumalli, Earlean Polka, MD  sertraline (ZOLOFT) 25 MG tablet Take 1 tablet (25 mg total) by mouth daily. 09/24/21   Penumalli, Earlean Polka, MD  tamsulosin (FLOMAX) 0.4 MG CAPS capsule Take 1 capsule (0.4 mg total) by mouth daily. 04/16/20   Vanessa Kick, MD  traZODone (DESYREL) 50 MG tablet Take 50 mg by mouth at bedtime. 08/17/20   [provider]      Allergies    Patient has no known allergies.    Review of Systems   Review of Systems  All other systems reviewed and are negative.  Physical Exam Updated Vital Signs BP (!) 141/57 (BP Location: Left Arm)   Pulse 72   Temp 97.7 F (36.5 C) (Oral)   Resp (!) 22   SpO2 100% ` Physical Exam Vitals and nursing note reviewed.  Constitutional:      General: He is not in acute distress.    Appearance: He is well-developed.     Comments: Patient appears to be in no acute discomfort  HENT:     Head: Atraumatic.  Eyes:     Conjunctiva/sclera: Conjunctivae normal.  Cardiovascular:     Rate and Rhythm: Normal rate and regular rhythm.  Pulmonary:     Breath sounds: No wheezing, rhonchi or rales.  Abdominal:     Palpations: Abdomen is soft.     Tenderness: There is no abdominal tenderness.  Genitourinary:    Comments: Chaperone present during exam.  Blood noted at tip of penis without any obvious laceration.  Penile shaft nontender.  No suprapubic tenderness. Musculoskeletal:     Cervical back: Neck supple.  Skin:    Findings: No rash.  Neurological:     Mental Status: He is alert. He is disoriented.    ED Results / Procedures / Treatments   Labs (all labs ordered are listed, but only abnormal results are  displayed) Labs Reviewed  URINALYSIS, ROUTINE W REFLEX MICROSCOPIC - Abnormal; Notable for the following components:      Result Value   APPearance HAZY (*)    Hgb urine dipstick LARGE (*)    Ketones, ur 5 (*)    Protein, ur 30 (*)    Nitrite POSITIVE (*)    Leukocytes,Ua MODERATE (*)    RBC / HPF >50 (*)    Bacteria, UA MANY (*)    All other components within normal limits  CBC WITH DIFFERENTIAL/PLATELET - Abnormal; Notable for the following components:   WBC 15.9 (*)    RBC 4.20 (*)    Hemoglobin 11.4 (*)    HCT 35.4 (*)    Neutro Abs 14.5 (*)    Abs Immature Granulocytes 0.13 (*)    All other components within normal limits  I-STAT CHEM 8, ED - Abnormal; Notable for the following components:   Potassium 3.0 (*)    Creatinine, Ser 1.40 (*)    Glucose, Bld 125 (*)    Calcium, Ion 0.99 (*)    Hemoglobin 11.2 (*)    HCT 33.0 (*)    All other components within normal limits  CULTURE, BLOOD (ROUTINE X 2)  CULTURE, BLOOD (ROUTINE X 2)  URINE CULTURE  LACTIC ACID, PLASMA    EKG None  Radiology DG Chest 2 View  Result Date: 11/09/2021 CLINICAL DATA:  76 year old male with shortness of breath. EXAM: CHEST - 2 VIEW COMPARISON:  05/27/2021 FINDINGS: The mediastinal contours are within normal limits. No cardiomegaly. Similar appearing elevation of the left hemidiaphragm. Mild cephalization of pulmonary vasculature. Hazy bibasilar pulmonary opacities. No pleural effusion or pneumothorax. No acute osseous abnormality. IMPRESSION: Mild pulmonary edema. Electronically Signed   By: Ruthann Cancer M.D.   On: 11/09/2021 09:28    Procedures Procedures    Medications Ordered in ED Medications  potassium chloride 10 mEq in 100 mL IVPB (10 mEq Intravenous New Bag/Given 11/09/21 1215)  sodium chloride 0.9 % with cefTRIAXone (ROCEPHIN) ADS Med (  Not Given 11/09/21 1223)  0.9 %  sodium chloride infusion (has no administration in time range)  sodium chloride 0.9 % bolus 1,000 mL (0 mLs  Intravenous Stopped 11/09/21 1020)  cefTRIAXone (ROCEPHIN) 1 g in sodium chloride 0.9 % 100 mL IVPB (0 g Intravenous Stopped 11/09/21 1134)  azithromycin (ZITHROMAX) tablet 500 mg (500 mg Oral Given 11/09/21 1106)  potassium chloride SA (KLOR-CON M) CR tablet 40 mEq (40 mEq Oral Given 11/09/21 1107)    ED Course/ Medical Decision Making/ A&P  Medical Decision Making Amount and/or Complexity of Data Reviewed Labs: ordered. Radiology: ordered.  Risk Prescription drug management.   BP (!) 141/57 (BP Location: Left Arm)   Pulse 72   Temp 97.7 F (36.5 C) (Oral)   Resp (!) 22   SpO2 100%   61:92 AM 76 year old male brought here from home due to pulling out his Foley catheter with the balloon still inflated.  Patient appears to be a poor historian.  Patient mention having lower abdominal discomfort for several weeks and he was frustrated therefore he removed the Foley catheter without deflating the balloon.  He does not complain of any significant pain from the process he does not endorse any fever nausea vomiting.  EMS initially evaluate patient and document that he was having some shortness of breath and therefore he received albuterol treatment.  On exam patient appears to be in no acute respiratory discomfort, lungs are clear, oxygen at 100% on room air.  I palpate abdomen without any reproducible tenderness.  I examined his penis which is remarkable for some dried blood noted at the urethral meatus but no significant shaft tenderness or suprapubic tenderness.  We will replace Foley catheter.  We will also screen for potential underlying infection which may increase risk of delirium.  9:47 AM Labs, and chest x-ray obtained independently viewed interpreted by me and I agree with radiologist interpretation.  Labs remarkable for AKI with a creatinine of 1.4, worsened compared to prior value.  Potassium is 3.0, will need replenishment.  Patient have elevated white count of 15.9  and chest x-ray shows hazy bibasilar pulmonary opacities.  Given initial report of shortness of breath, patient having a questionable pneumonia on chest x-ray as well as elevated white count, will treat with antibiotic which includes Rocephin and Zithromax.  Furthermore, due to evidence of AKI and increased confusion, will give IV fluid and have patient admitted.  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  He also had acute urinary retention and had a Foley placed which was subsequently removed prior to discharge.  However patient does have a Foley catheter here and I am unable to obtain much information from him.  Urinalysis today shows finding concerning for UTI with positive nitrite, moderate leukocyte Estrace and many WBC.  Initial lactic acid is 1.9.  Urine culture sent, will consult for admission.  12:46 PM Appreciate consultation from hospitalist Dr. Olevia Bowens who agrees to admit patient for further management of his AKI, urinary tract infection causing delirium, and hypokalemia.  This patient presents to the ED for concern of delirium, this involves an extensive number of treatment options, and is a complaint that carries with it a high risk of complications and morbidity.  The differential diagnosis includes UTI, aki, electrolytes imbalance, stroke, depression, infectious cause  Co morbidities that complicate the patient evaluation parkinson's disease  Paralysis agitans Additional history obtained:  Additional history obtained from EMS External records from outside source obtained and reviewed including notes from recent hospital admission  Lab Tests:  I Ordered, and personally interpreted labs.  The pertinent results include:  as above  Imaging Studies ordered:  I ordered imaging studies including CXR I independently visualized and interpreted imaging  which showed hazy bibasilar opacities I agree with the radiologist interpretation  Cardiac Monitoring:  The patient was maintained on a cardiac monitor.  I personally viewed and  interpreted the cardiac monitored which showed an underlying rhythm of: NSR  Medicines ordered and prescription drug management:  I ordered medication including rocephin/zithromax  for UTI and possible PNA Reevaluation of the patient after these medicines showed that the patient improved I have reviewed the patients home medicines and have made adjustments as needed  Test Considered: abd/pelvis CT.  Hx of cecal mass, however no significant reproducible abd tenderness  Critical Interventions: IVF  IV abx  Consultations Obtained:  I requested consultation with the [facility] Dr. Olevia Bowens,  and discussed lab and imaging findings as well as pertinent plan - they recommend: admission  Problem List / ED Course: AKI  UTI  Hypokalemia  Foley trauma  delirium  Reevaluation:  After the interventions noted above, I reevaluated the patient and found that they have :improved  Social Determinants of Health: tobacco use  Dispostion:  After consideration of the diagnostic results and the patients response to treatment, I feel that the patent would benefit from admission.         Final Clinical Impression(s) / ED Diagnoses Final diagnoses:  AKI (acute kidney injury) ([place])  Delirium  Urinary tract infection with hematuria, site unspecified  Hypokalemia    Rx / DC Orders ED Discharge Orders     None         Domenic Moras, PA-C 11/09/21 1253    Lennice Sites, DO 11/09/21 1344

## 2021-11-10 ENCOUNTER — Encounter (HOSPITAL_COMMUNITY): Payer: Self-pay | Admitting: Internal Medicine

## 2021-11-10 DIAGNOSIS — R7881 Bacteremia: Secondary | ICD-10-CM | POA: Diagnosis present

## 2021-11-10 DIAGNOSIS — I10 Essential (primary) hypertension: Secondary | ICD-10-CM | POA: Diagnosis present

## 2021-11-10 DIAGNOSIS — B9620 Unspecified Escherichia coli [E. coli] as the cause of diseases classified elsewhere: Secondary | ICD-10-CM | POA: Diagnosis present

## 2021-11-10 DIAGNOSIS — R410 Disorientation, unspecified: Secondary | ICD-10-CM

## 2021-11-10 DIAGNOSIS — N179 Acute kidney failure, unspecified: Secondary | ICD-10-CM | POA: Diagnosis present

## 2021-11-10 DIAGNOSIS — Y846 Urinary catheterization as the cause of abnormal reaction of the patient, or of later complication, without mention of misadventure at the time of the procedure: Secondary | ICD-10-CM | POA: Diagnosis present

## 2021-11-10 DIAGNOSIS — G20 Parkinson's disease: Secondary | ICD-10-CM | POA: Diagnosis present

## 2021-11-10 DIAGNOSIS — Z8616 Personal history of COVID-19: Secondary | ICD-10-CM | POA: Diagnosis not present

## 2021-11-10 DIAGNOSIS — N400 Enlarged prostate without lower urinary tract symptoms: Secondary | ICD-10-CM | POA: Diagnosis present

## 2021-11-10 DIAGNOSIS — Z79899 Other long term (current) drug therapy: Secondary | ICD-10-CM | POA: Diagnosis not present

## 2021-11-10 DIAGNOSIS — G4752 REM sleep behavior disorder: Secondary | ICD-10-CM | POA: Diagnosis present

## 2021-11-10 DIAGNOSIS — N390 Urinary tract infection, site not specified: Secondary | ICD-10-CM | POA: Diagnosis present

## 2021-11-10 DIAGNOSIS — R319 Hematuria, unspecified: Secondary | ICD-10-CM | POA: Diagnosis present

## 2021-11-10 DIAGNOSIS — E876 Hypokalemia: Secondary | ICD-10-CM | POA: Diagnosis present

## 2021-11-10 DIAGNOSIS — D649 Anemia, unspecified: Secondary | ICD-10-CM

## 2021-11-10 DIAGNOSIS — Z87891 Personal history of nicotine dependence: Secondary | ICD-10-CM | POA: Diagnosis not present

## 2021-11-10 DIAGNOSIS — E785 Hyperlipidemia, unspecified: Secondary | ICD-10-CM | POA: Diagnosis present

## 2021-11-10 DIAGNOSIS — T83511A Infection and inflammatory reaction due to indwelling urethral catheter, initial encounter: Secondary | ICD-10-CM | POA: Diagnosis present

## 2021-11-10 LAB — BLOOD CULTURE ID PANEL (REFLEXED) - BCID2

## 2021-11-10 LAB — CBC
HCT: 29.4 % — ABNORMAL LOW (ref 39.0–52.0)
Hemoglobin: 9.7 g/dL — ABNORMAL LOW (ref 13.0–17.0)
MCH: 27.2 pg (ref 26.0–34.0)
MCHC: 33 g/dL (ref 30.0–36.0)
MCV: 82.6 fL (ref 80.0–100.0)
Platelets: 283 10*3/uL (ref 150–400)
RBC: 3.56 MIL/uL — ABNORMAL LOW (ref 4.22–5.81)
RDW: 14.2 % (ref 11.5–15.5)
WBC: 13.5 10*3/uL — ABNORMAL HIGH (ref 4.0–10.5)
nRBC: 0 % (ref 0.0–0.2)

## 2021-11-10 LAB — COMPREHENSIVE METABOLIC PANEL
ALT: <5 U/L (ref 0–44)
AST: 13 U/L — ABNORMAL LOW (ref 15–41)
Albumin: 2 g/dL — ABNORMAL LOW (ref 3.5–5.0)
Alkaline Phosphatase: 70 U/L (ref 38–126)
Anion gap: 5 (ref 5–15)
BUN: 19 mg/dL (ref 8–23)
CO2: 27 mmol/L (ref 22–32)
Calcium: 7.7 mg/dL — ABNORMAL LOW (ref 8.9–10.3)
Chloride: 110 mmol/L (ref 98–111)
Creatinine, Ser: 1.13 mg/dL (ref 0.61–1.24)
GFR, Estimated: >60 mL/min (ref >60)
Glucose, Bld: 120 mg/dL — ABNORMAL HIGH (ref 70–99)
Potassium: 3.3 mmol/L — ABNORMAL LOW (ref 3.5–5.1)
Sodium: 142 mmol/L (ref 135–145)
Total Bilirubin: 1 mg/dL (ref 0.3–1.2)
Total Protein: 5.4 g/dL — ABNORMAL LOW (ref 6.5–8.1)

## 2021-11-10 MED ORDER — ORAL CARE MOUTH RINSE
15.0000 mL | Freq: Two times a day (BID) | OROMUCOSAL | Status: DC
  Administered 2021-11-10 – 2021-11-11 (×4): 15 mL via OROMUCOSAL

## 2021-11-10 MED ORDER — SODIUM CHLORIDE 0.9 % IV SOLN
2.0000 g | INTRAVENOUS | Status: DC
  Administered 2021-11-10 – 2021-11-11 (×2): 2 g via INTRAVENOUS
  Filled 2021-11-10 (×2): qty 20

## 2021-11-10 NOTE — Hospital Course (Addendum)
76 y.o. male with medical history significant of abnormal drainage, aortic atherosclerosis, BPH, bursitis and tendinitis of the shoulder, hyperglycemia, hyperlipidemia, toxic neuropathy, Parkinson's disease, COVID-19 pneumonia, REM sleep behavior disorder, dyspnea, vitamin D deficiency, benign paroxysmal vertigo who was admitted on 5/19 until 5/23 last month and is returning to the emergency department after pulling his Foley catheter out.  Pt later found to have evidence of UTI and ecoli bacteremia

## 2021-11-10 NOTE — Progress Notes (Signed)
  Progress Note   Patient: Theodore King QQI:[id number] DOB: August 24, 1945 DOA: 11/09/2021     0 DOS: the patient was seen and examined on 11/10/2021   Brief hospital course: 76 y.o. male with medical history significant of abnormal drainage, aortic atherosclerosis, BPH, bursitis and tendinitis of the shoulder, hyperglycemia, hyperlipidemia, toxic neuropathy, Parkinson's disease, COVID-19 pneumonia, REM sleep behavior disorder, dyspnea, vitamin D deficiency, benign paroxysmal vertigo who was admitted on 5/19 until 5/23 last month and is returning to the emergency department after pulling his Foley catheter out.  Pt later found to have evidence of UTI and ecoli bacteremia  Assessment and Plan: Ecoli UTI with ecoli bacteremia present on admit, related to foley cath -Presenting UA suggestive of UTI -Blood and urine cx both pos for ecoli, pending sensitivities -cont empiric rocephin -recheck cbc in AM    AKI (acute kidney injury) ([place]) -Cr improved to 1.13 from 1.4 with IVF -Cont to avoid nephrotoxic meds -Recheck bmet in AM    Hypokalemia Replaced -recheck bmet in AM     Normocytic anemia -Hemodynamically stable -no evidence of acute blood loss at this time -Recheck cbc in AM     Parkinson's disease (HCC) Continue Sinemet IR 4 times a day. Continue Sinemet CR at bedtime.     BPH (benign prostatic hyperplasia) Continue tamsulosin 0.4 mg p.o. daily.     Subjective: Reports feeling better today  Physical Exam: Vitals:   11/09/21 1653 11/09/21 1900 11/09/21 2203 11/10/21 0530  BP: 139/70  (!) 133/55 (!) 142/65  Pulse: 64  68 66  Resp: '16  18 18  '$ Temp: 98.1 F (36.7 C)  98.6 F (37 C) 98.4 F (36.9 C)  TempSrc: Oral  Oral Oral  SpO2: 97%  100% 97%  Weight:  82.8 kg    Height:  '6\' 1"'$  (1.854 m)     General exam: Awake, laying in bed, in nad Respiratory system: Normal respiratory effort, no wheezing Cardiovascular system: regular rate, s1, s2 Gastrointestinal system:  Soft, nondistended, positive BS Central nervous system: CN2-12 grossly intact, strength intact Extremities: Perfused, no clubbing Skin: Normal skin turgor, no notable skin lesions seen Psychiatry: Mood normal // no visual hallucinations   Data Reviewed:  Labs reviewed: K 3.3, Cr 1.13, WBC 13.5, Hgb 9.7  Family Communication: Pt in room, family not at bedside  Disposition: Status is: Observation The patient will require care spanning > 2 midnights and should be moved to inpatient because: Severity of illness  Planned Discharge Destination: Home     Author: [name], MD 11/10/2021 5:53 PM  For on call review [email or web address].

## 2021-11-10 NOTE — Progress Notes (Signed)
PHARMACY - PHYSICIAN COMMUNICATION CRITICAL VALUE ALERT - BLOOD CULTURE IDENTIFICATION (BCID)  Theodore King is an 76 y.o. male who presented to [facility] on 11/09/2021 with a chief complaint of urinary discomfort- pulled out foley.    Assessment:   Blood cx + GNR- BCID + Ecoli, no resistance.  Possible urinary source.  Name of physician (or Provider) Contacted: Clarene Essex, NP  Current antibiotics: none- although he did receive Rocephin + Zithromax in ED on 6/5  Changes to prescribed antibiotics recommended:  Rocephin 2gm IV q24h F/U cx data  Results for orders placed or performed during the hospital encounter of 11/09/21  Blood Culture ID Panel (Reflexed) (Collected: 11/09/2021 11:09 AM)  Result Value Ref Range   Enterococcus faecalis NOT DETECTED NOT DETECTED   Enterococcus Faecium NOT DETECTED NOT DETECTED   Listeria monocytogenes NOT DETECTED NOT DETECTED   Staphylococcus species NOT DETECTED NOT DETECTED   Staphylococcus aureus (BCID) NOT DETECTED NOT DETECTED   Staphylococcus epidermidis NOT DETECTED NOT DETECTED   Staphylococcus lugdunensis NOT DETECTED NOT DETECTED   Streptococcus species NOT DETECTED NOT DETECTED   Streptococcus agalactiae NOT DETECTED NOT DETECTED   Streptococcus pneumoniae NOT DETECTED NOT DETECTED   Streptococcus pyogenes NOT DETECTED NOT DETECTED   A.calcoaceticus-baumannii NOT DETECTED NOT DETECTED   Bacteroides fragilis NOT DETECTED NOT DETECTED   Enterobacterales DETECTED (A) NOT DETECTED   Enterobacter cloacae complex NOT DETECTED NOT DETECTED   Escherichia coli DETECTED (A) NOT DETECTED   Klebsiella aerogenes NOT DETECTED NOT DETECTED   Klebsiella oxytoca NOT DETECTED NOT DETECTED   Klebsiella pneumoniae NOT DETECTED NOT DETECTED   Proteus species NOT DETECTED NOT DETECTED   Salmonella species NOT DETECTED NOT DETECTED   Serratia marcescens NOT DETECTED NOT DETECTED   Haemophilus influenzae NOT DETECTED NOT DETECTED   Neisseria  meningitidis NOT DETECTED NOT DETECTED   Pseudomonas aeruginosa NOT DETECTED NOT DETECTED   Stenotrophomonas maltophilia NOT DETECTED NOT DETECTED   Candida albicans NOT DETECTED NOT DETECTED   Candida auris NOT DETECTED NOT DETECTED   Candida glabrata NOT DETECTED NOT DETECTED   Candida krusei NOT DETECTED NOT DETECTED   Candida parapsilosis NOT DETECTED NOT DETECTED   Candida tropicalis NOT DETECTED NOT DETECTED   Cryptococcus neoformans/gattii NOT DETECTED NOT DETECTED   CTX-M ESBL NOT DETECTED NOT DETECTED   Carbapenem resistance IMP NOT DETECTED NOT DETECTED   Carbapenem resistance KPC NOT DETECTED NOT DETECTED   Carbapenem resistance NDM NOT DETECTED NOT DETECTED   Carbapenem resist OXA 48 LIKE NOT DETECTED NOT DETECTED   Carbapenem resistance VIM NOT DETECTED NOT DETECTED    [name] 11/10/2021  3:32 AM

## 2021-11-11 DIAGNOSIS — N390 Urinary tract infection, site not specified: Secondary | ICD-10-CM

## 2021-11-11 DIAGNOSIS — R319 Hematuria, unspecified: Secondary | ICD-10-CM

## 2021-11-11 DIAGNOSIS — I10 Essential (primary) hypertension: Secondary | ICD-10-CM

## 2021-11-11 DIAGNOSIS — R7881 Bacteremia: Secondary | ICD-10-CM

## 2021-11-11 DIAGNOSIS — E876 Hypokalemia: Secondary | ICD-10-CM

## 2021-11-11 DIAGNOSIS — G20 Parkinson's disease: Secondary | ICD-10-CM

## 2021-11-11 DIAGNOSIS — B9620 Unspecified Escherichia coli [E. coli] as the cause of diseases classified elsewhere: Secondary | ICD-10-CM

## 2021-11-11 LAB — COMPREHENSIVE METABOLIC PANEL
ALT: <5 U/L (ref 0–44)
AST: 14 U/L — ABNORMAL LOW (ref 15–41)
Albumin: 2.1 g/dL — ABNORMAL LOW (ref 3.5–5.0)
Alkaline Phosphatase: 63 U/L (ref 38–126)
Anion gap: 6 (ref 5–15)
BUN: 17 mg/dL (ref 8–23)
CO2: 27 mmol/L (ref 22–32)
Calcium: 8.1 mg/dL — ABNORMAL LOW (ref 8.9–10.3)
Chloride: 111 mmol/L (ref 98–111)
Creatinine, Ser: 1.06 mg/dL (ref 0.61–1.24)
GFR, Estimated: >60 mL/min (ref >60)
Glucose, Bld: 100 mg/dL — ABNORMAL HIGH (ref 70–99)
Potassium: 3.5 mmol/L (ref 3.5–5.1)
Sodium: 144 mmol/L (ref 135–145)
Total Bilirubin: 0.9 mg/dL (ref 0.3–1.2)
Total Protein: 5.6 g/dL — ABNORMAL LOW (ref 6.5–8.1)

## 2021-11-11 LAB — URINE CULTURE: Culture: 40000 — AB

## 2021-11-11 LAB — CBC
HCT: 30.2 % — ABNORMAL LOW (ref 39.0–52.0)
Hemoglobin: 9.8 g/dL — ABNORMAL LOW (ref 13.0–17.0)
MCH: 27.1 pg (ref 26.0–34.0)
MCHC: 32.5 g/dL (ref 30.0–36.0)
MCV: 83.7 fL (ref 80.0–100.0)
Platelets: 270 10*3/uL (ref 150–400)
RBC: 3.61 MIL/uL — ABNORMAL LOW (ref 4.22–5.81)
RDW: 14.3 % (ref 11.5–15.5)
WBC: 9.7 10*3/uL (ref 4.0–10.5)
nRBC: 0 % (ref 0.0–0.2)

## 2021-11-11 MED ORDER — CHLORHEXIDINE GLUCONATE CLOTH 2 % EX PADS
6.0000 | MEDICATED_PAD | Freq: Every day | CUTANEOUS | Status: DC
  Administered 2021-11-11: 6 via TOPICAL

## 2021-11-11 MED ORDER — SODIUM CHLORIDE 0.9 % IV SOLN
2.0000 g | INTRAVENOUS | Status: DC
  Administered 2021-11-11 – 2021-11-12 (×6): 2 g via INTRAVENOUS
  Filled 2021-11-11 (×9): qty 2000

## 2021-11-11 NOTE — Progress Notes (Addendum)
[facility] Progress Note  Patient: Theodore King     KGU:[id number]  DOA: 11/09/2021   PCP: [name], MD       Brief hospital course: This is a 76 year old with Parkinson's disease who presents to the hospital after pulling out his Foley catheter at home.  He also complained of shortness of breath at home.  EMS gave him albuterol treatments in route.  Blood pressure noted to be 112/54.  250 cc normal saline bolus given by EMS with improvement in blood pressure.  EMS also noted that the Foley bulb was at the tip of the penis and still inflated. In the ED, patient appeared to be in respiratory distress although oxygen was 100% on room air.  WBC count was 15.9.  Creatinine 1.4.  Lactic acid normal.  UA was grossly positive for infection. Chest x-ray revealed cardiomegaly with mild pulmonary edema  Subjective:  Patient is sitting in bed eating his spaghetti.  He states that his breathing is much better.  Assessment and Plan: Principal Problem:   AKI (acute kidney injury) ([place]) -Creatinine 1.4 on admission. -Creatinine now 1.06  Active Problems: E. coli UTI and bacteremia - Has 40,000 colonies of E. coli and 30,000 colonies of E faecalis in his urine -also growing E. coli and 1 set of blood cultures- Continue ceftriaxone while waiting on sensitivities - due to enterococcus, have switched Ceftriaxone to ampicillin  Essential hypertension - Continue amlodipine    Parkinson's disease (HCC) -Continue Sinemet    BPH (benign prostatic hyperplasia) -Continue Flomax and foley catheter    Hypokalemia -Replace as needed    DVT prophylaxis:  SCDs Start: 11/09/21 1249     Code Status: Full Code  Consultants: None Level of Care: Level of care: Telemetry Disposition Plan:  Status is: Inpatient Remains inpatient appropriate because: Awaiting sensitivities of E. coli bacteremia  Objective:   Vitals:   11/11/21 0456 11/11/21 0457 11/11/21 0937 11/11/21 1455  BP: (!)  160/69 (!) 160/69 (!) 145/60 (!) 159/63  Pulse: 68 67  62  Resp: '18 18  17  '$ Temp: 98.2 F (36.8 C) 98.2 F (36.8 C)  98.1 F (36.7 C)  TempSrc: Oral Oral  Oral  SpO2: 100% 99%  100%  Weight:      Height:       Filed Weights   11/09/21 1900  Weight: 82.8 kg   Exam: General exam: Appears comfortable  HEENT: PERRLA, oral mucosa moist, no sclera icterus or thrush Respiratory system: Clear to auscultation. Respiratory effort normal. Cardiovascular system: S1 & S2 heard, regular rate and rhythm Gastrointestinal system: Abdomen soft, non-tender, nondistended. Normal bowel sounds   Central nervous system: Alert and oriented. No focal neurological deficits. Extremities: No cyanosis, clubbing or edema Skin: No rashes or ulcers Psychiatry:  Mood & affect appropriate.    Imaging and lab data was personally reviewed    CBC: Recent Labs  Lab 11/09/21 0912 11/09/21 0918 11/10/21 0653 11/11/21 0508  WBC 15.9*  --  13.5* 9.7  NEUTROABS 14.5*  --   --   --   HGB 11.4* 11.2* 9.7* 9.8*  HCT 35.4* 33.0* 29.4* 30.2*  MCV 84.3  --  82.6 83.7  PLT 299  --  283 628   Basic Metabolic Panel: Recent Labs  Lab 11/09/21 0912 11/09/21 0918 11/09/21 2111 11/10/21 0653 11/11/21 0508  NA  --  143 143 142 144  K  --  3.0* 3.4* 3.3* 3.5  CL  --  103 112*  110 111  CO2  --   --  '24 27 27  '$ GLUCOSE  --  125* 163* 120* 100*  BUN  --  '17 16 19 17  '$ CREATININE  --  1.40* 1.13 1.13 1.06  CALCIUM  --   --  7.0* 7.7* 8.1*  MG 2.0  --   --   --   --   PHOS  --   --  2.2*  --   --    GFR: Estimated Creatinine Clearance: 64.9 mL/min (by C-G formula based on SCr of 1.06 mg/dL).  Scheduled Meds:  amLODipine  2.5 mg Oral Daily   carbidopa-levodopa  1 tablet Oral QHS   carbidopa-levodopa  2 tablet Oral 3 times per day   And   carbidopa-levodopa  1 tablet Oral q1800   Chlorhexidine Gluconate Cloth  6 each Topical Daily   glycopyrrolate  1 mg Oral BID   mouth rinse  15 mL Mouth Rinse BID    sertraline  25 mg Oral Daily   tamsulosin  0.4 mg Oral Daily   Continuous Infusions:  ampicillin (OMNIPEN) IV 2 g (11/11/21 1645)     LOS: 1 day   Author: Debbe Odea  11/11/2021 6:15 PM

## 2021-11-11 NOTE — Plan of Care (Signed)

## 2021-11-12 DIAGNOSIS — Z87891 Personal history of nicotine dependence: Secondary | ICD-10-CM

## 2021-11-12 DIAGNOSIS — N400 Enlarged prostate without lower urinary tract symptoms: Secondary | ICD-10-CM

## 2021-11-12 DIAGNOSIS — I10 Essential (primary) hypertension: Secondary | ICD-10-CM

## 2021-11-12 DIAGNOSIS — N390 Urinary tract infection, site not specified: Secondary | ICD-10-CM

## 2021-11-12 LAB — CULTURE, BLOOD (ROUTINE X 2)

## 2021-11-12 MED ORDER — AMOXICILLIN 500 MG PO CAPS: 500.0000 mg | ORAL_CAPSULE | Freq: Three times a day (TID) | ORAL | 0 refills | Status: AC

## 2021-11-12 MED ORDER — AMOXICILLIN 250 MG PO CAPS
500.0000 mg | ORAL_CAPSULE | Freq: Three times a day (TID) | ORAL | Status: DC
  Administered 2021-11-12: 500 mg via ORAL
  Filled 2021-11-12: qty 2

## 2021-11-12 NOTE — Evaluation (Signed)
Physical Therapy Evaluation Patient Details Name: Theodore King MRN: [id number] DOB: 1945-06-16 Today's Date: 11/12/2021  History of Present Illness  Pt is a 76 y.o. male admitted after pulling out his foley catheter at home.  Pt found to have AKI and E. coli UTI and bacteremia.  PMH significant for parkinson's disease, PNA due to COVID-19, vertigo, aortic atherosclerosis, Rt RTC repair.  Clinical Impression  Pt admitted with above diagnosis.  Pt currently with functional limitations due to the deficits listed below (see PT Problem List). Pt will benefit from skilled PT to increase their independence and safety with mobility to allow discharge to the venue listed below.  Pt in recliner on arrival and currently min assist for mobility.  Pt reports he has assist from daughter and son when needed.  Pt typically ambulatory with RW and reports he was receiving HHPT prior to admission.       Recommendations for follow up therapy are one component of a multi-disciplinary discharge planning process, led by the attending physician.  Recommendations may be updated based on patient status, additional functional criteria and insurance authorization.  Follow Up Recommendations Home health PT    Assistance Recommended at Discharge Frequent or constant Supervision/Assistance  Patient can return home with the following  A little help with walking and/or transfers;A little help with bathing/dressing/bathroom;Assistance with cooking/housework;Assist for transportation;Help with stairs or ramp for entrance    Equipment Recommendations None recommended by PT  Recommendations for Other Services       Functional Status Assessment Patient has had a recent decline in their functional status and demonstrates the ability to make significant improvements in function in a reasonable and predictable amount of time.     Precautions / Restrictions Precautions Precautions: Fall      Mobility  Bed  Mobility Overal bed mobility: Needs Assistance Bed Mobility: Sit to Supine       Sit to supine: Mod assist   General bed mobility comments: assist for LEs onto bed    Transfers Overall transfer level: Needs assistance Equipment used: Rolling walker (2 wheels) Transfers: Sit to/from Stand Sit to Stand: Min assist           General transfer comment: cues for hand placement, light assist for rise, pt controlled descent    Ambulation/Gait Ambulation/Gait assistance: Min assist, Min guard Gait Distance (Feet): 50 Feet Assistive device: Rolling walker (2 wheels) Gait Pattern/deviations: Step-to pattern, Decreased stride length, Trunk flexed, Shuffle Gait velocity: decreased     General Gait Details: cues for "big steps", improved to min/guard with distance; trunk leans to right with ambulating and pt not able to correct  Stairs            Wheelchair Mobility    Modified Rankin (Stroke Patients Only)       Balance Overall balance assessment: Needs assistance Sitting-balance support: Feet supported Sitting balance-Leahy Scale: Fair     Standing balance support: Bilateral upper extremity supported, During functional activity, Reliant on assistive device for balance Standing balance-Leahy Scale: Poor                               Pertinent Vitals/Pain Pain Assessment Pain Assessment: No/denies pain    Home Living Family/patient expects to be discharged to:: Private residence Living Arrangements: Children (daughter and son) Available Help at Discharge: Family Type of Home: House Home Access: Stairs to enter Entrance Stairs-Rails: Psychiatric nurse of Steps: 3  Home Layout: One level Home Equipment: Conservation officer, nature (2 wheels);Cane - single point;Grab bars - tub/shower;BSC/3in1 Additional Comments: daughter works during day. Son assists while daughter at work.    Prior Function Prior Level of Function : Independent/Modified  Independent             Mobility Comments: SPC at baseline when knee bothering him. Sometimes uses RW. Reports last fall couple weeks ago "leg gave way on me"       Hand Dominance        Extremity/Trunk Assessment        Lower Extremity Assessment Lower Extremity Assessment: Generalized weakness    Cervical / Trunk Assessment Cervical / Trunk Assessment: Kyphotic  Communication   Communication: Expressive difficulties (dysarthric speech)  Cognition Arousal/Alertness: Awake/alert Behavior During Therapy: WFL for tasks assessed/performed Overall Cognitive Status: Within Functional Limits for tasks assessed                                          General Comments      Exercises     Assessment/Plan    PT Assessment Patient needs continued PT services  PT Problem List Decreased strength;Decreased activity tolerance;Decreased balance;Decreased mobility;Decreased knowledge of use of DME       PT Treatment Interventions DME instruction;Gait training;Stair training;Functional mobility training;Therapeutic activities;Therapeutic exercise;Balance training;Patient/family education    PT Goals (Current goals can be found in the Care Plan section)  Acute Rehab PT Goals PT Goal Formulation: With patient Time For Goal Achievement: 11/26/21 Potential to Achieve Goals: Good    Frequency Min 3X/week     Co-evaluation               AM-PAC PT "6 Clicks" Mobility  Outcome Measure Help needed turning from your back to your side while in a flat bed without using bedrails?: A Little Help needed moving from lying on your back to sitting on the side of a flat bed without using bedrails?: A Little Help needed moving to and from a bed to a chair (including a wheelchair)?: A Little Help needed standing up from a chair using your arms (e.g., wheelchair or bedside chair)?: A Little Help needed to walk in hospital room?: A Little Help needed climbing 3-5  steps with a railing? : A Lot 6 Click Score: 17    End of Session Equipment Utilized During Treatment: Gait belt Activity Tolerance: Patient tolerated treatment well Patient left: with call bell/phone within reach;in bed;with bed alarm set Nurse Communication: Mobility status PT Visit Diagnosis: Unsteadiness on feet (R26.81);Muscle weakness (generalized) (M62.81)    Time: 3614-4315 PT Time Calculation (min) (ACUTE ONLY): 28 min   Charges:   PT Evaluation $PT Eval Low Complexity: 1 Low PT Treatments $Gait Training: 8-22 mins      Jannette Spanner PT, DPT Acute Rehabilitation Services Pager: [phone] Office: [place] 11/12/2021, 3:00 PM

## 2021-11-12 NOTE — Discharge Summary (Signed)
Physician Discharge Summary  Theodore King QAS:[id number] DOB: [birth year] DOA: 11/09/2021  PCP: [name], MD  Admit date: 11/09/2021 Discharge date: 11/12/2021 Discharging to: back to home   Follow up: - Hgb has dropped from 11 to 9 maybe from IVF - please v/u as outpt  Discharge Diagnoses:   Principal Problem:   AKI (acute kidney injury) ([place]) Active Problems:   Parkinson's disease ([place])   BPH (benign prostatic hyperplasia)   Hypokalemia   Normocytic anemia   E coli bacteremia   Essential hypertension   UTI (urinary tract infection) due to Enterococcus and E coli     Hospital Course: This is a 76 year old with Parkinson's disease who presents to the hospital after pulling out his Foley catheter at home.  He also complained of shortness of breath at home.  EMS gave him albuterol treatments in route.  Blood pressure noted to be 112/54.  250 cc normal saline bolus given by EMS with improvement in blood pressure.  EMS also noted that the Foley bulb was at the tip of the penis and still inflated. In the ED, patient appeared to be in respiratory distress although oxygen was 100% on room air.  WBC count was 15.9.  Creatinine 1.4.  Lactic acid normal.  UA was grossly positive for infection. Chest x-ray revealed cardiomegaly with mild pulmonary edema    Principal Problem:   AKI (acute kidney injury) ([place]) -Creatinine 1.40 on admission. -Creatinine 1.06 when last checked yesterday - baseline is near 0.7-0.8   Active Problems: E. coli UTI and bacteremia- sepsis ruled out - Has 40,000 colonies of E. coli and 30,000 colonies of E faecalis in his urine  -also growing E. coli and 1 set of blood cultures-   - WBC 15.9 have improved to 9.7 today - due to enterococcus, and based on e coli sensitivities, have switched Ceftriaxone to  Amp and then Amoxil- will need 6 more days   Essential hypertension - Continue amlodipine     Parkinson's disease ([facility]) -Continue Sinemet     BPH  (benign prostatic hyperplasia) -Continue Flomax and foley catheter     Hypokalemia -Replaced   Normocytic anemia - Hgb has dropped from 11 to 9, maybe from IVF - please v/u as outpt   Body mass index is 24.08 kg/m.              Discharge Instructions  Discharge Instructions     Diet - low sodium heart healthy   Complete by: As directed    Increase activity slowly   Complete by: As directed       Allergies as of 11/12/2021   No Known Allergies      Medication List     TAKE these medications    acetaminophen 500 MG tablet Commonly known as: TYLENOL Take 500 mg by mouth every 6 (six) hours as needed for mild pain.   albuterol 108 (90 Base) MCG/ACT inhaler Commonly known as: VENTOLIN HFA Inhale 2 puffs into the lungs every 4 (four) hours as needed for wheezing or shortness of breath.   amLODipine 5 MG tablet Commonly known as: NORVASC Take 0.5 tablets (2.5 mg total) by mouth daily.   amoxicillin 500 MG capsule Commonly known as: AMOXIL Take 1 capsule (500 mg total) by mouth every 8 (eight) hours for 6 days.   carbidopa-levodopa 50-200 MG tablet Commonly known as: SINEMET CR Take 1 tablet by mouth at bedtime. What changed: Another medication with the same name was changed. Make sure  you understand how and when to take each.   carbidopa-levodopa 25-100 MG tablet Commonly known as: SINEMET IR Take 2 tablets at 7 AM, 2 tablets at 10 AM, 2 tablets at 2 PM and 1 tablet at 6 PM What changed:  how much to take how to take this when to take this additional instructions   glycopyrrolate 1 MG tablet Commonly known as: Robinul Take 1 tablet (1 mg total) by mouth 2 (two) times daily.   sertraline 25 MG tablet Commonly known as: ZOLOFT Take 1 tablet (25 mg total) by mouth daily.   tamsulosin 0.4 MG Caps capsule Commonly known as: FLOMAX Take 1 capsule (0.4 mg total) by mouth daily.   traZODone 50 MG tablet Commonly known as: DESYREL Take 50 mg by mouth  at bedtime.            The results of significant diagnostics from this hospitalization (including imaging, microbiology, ancillary and laboratory) are listed below for reference.    DG Chest 2 View  Result Date: 11/09/2021 CLINICAL DATA:  76 year old male with shortness of breath. EXAM: CHEST - 2 VIEW COMPARISON:  05/27/2021 FINDINGS: The mediastinal contours are within normal limits. No cardiomegaly. Similar appearing elevation of the left hemidiaphragm. Mild cephalization of pulmonary vasculature. Hazy bibasilar pulmonary opacities. No pleural effusion or pneumothorax. No acute osseous abnormality. IMPRESSION: Mild pulmonary edema. Electronically Signed   By: Ruthann Cancer M.D.   On: 11/09/2021 09:28   DG Abd 1 View  Result Date: 10/25/2021 CLINICAL DATA:  76 year old male with history of intractable nausea and vomiting. EXAM: ABDOMEN - 1 VIEW COMPARISON:  Abdominal radiograph 10/24/2021. FINDINGS: Nasogastric tube has been removed. Several prominent but nondilated loops of gas-filled small bowel are noted throughout the central abdomen measuring up to 2.6 cm in diameter. Gas and stool is also noted throughout the colon and distal rectum. No pneumoperitoneum. IMPRESSION: 1. Nonspecific, nonobstructive bowel gas pattern, as above. 2. No pneumoperitoneum. Electronically Signed   By: Vinnie Langton M.D.   On: 10/25/2021 07:46   DG Abd 1 View  Result Date: 10/24/2021 CLINICAL DATA:  NG tube placement. EXAM: ABDOMEN - 1 VIEW COMPARISON:  Abdominal x-ray 10/24/2021 FINDINGS: Nasogastric tube tip is in the gastric fundus. Dilated small bowel loops are again seen diffusely. Degree of distention has mildly decreased. IMPRESSION: 1. Nasogastric tube tip in the gastric fundus. 2. Dilated small bowel loops again seen, mildly decreased in size. Electronically Signed   By: Ronney Asters M.D.   On: 10/24/2021 23:08   DG Abd 2 Views  Result Date: 10/24/2021 CLINICAL DATA:  Small-bowel obstruction EXAM:  ABDOMEN - 2 VIEW COMPARISON:  None Available. FINDINGS: Air-filled mildly dilated loops of small bowel. No free air. No acute osseous abnormality. IMPRESSION: Air-filled, mildly dilated loops of small bowel may reflect distal small bowel obstruction. Electronically Signed   By: Macy Mis M.D.   On: 10/24/2021 12:02   CT ABDOMEN PELVIS WO CONTRAST  Result Date: 10/23/2021 CLINICAL DATA:  Abdominal pain with nausea and diarrhea. EXAM: CT ABDOMEN AND PELVIS WITHOUT CONTRAST TECHNIQUE: Multidetector CT imaging of the abdomen and pelvis was performed following the standard protocol without IV contrast. RADIATION DOSE REDUCTION: This exam was performed according to the departmental dose-optimization program which includes automated exposure control, adjustment of the mA and/or kV according to patient size and/or use of iterative reconstruction technique. COMPARISON:  CT chest abdomen pelvis dated August 22, 2020. FINDINGS: Lower chest: No acute abnormality. Hepatobiliary: No focal liver abnormality is  seen. No gallstones, gallbladder wall thickening, or biliary dilatation. Pancreas: Unremarkable. No pancreatic ductal dilatation or surrounding inflammatory changes. Spleen: Normal in size without focal abnormality. Adrenals/Urinary Tract: Adrenal glands are unremarkable. Kidneys are normal, without renal calculi, focal lesion, or hydronephrosis. Bladder is unremarkable. Stomach/Bowel: The stomach is within normal limits. There are multiple dilated loops of small bowel with gradual transition to non-dilated terminal ileum. The cecum is mildly distended too, and there is questionable mass-like irregular wall thickening of the cecum distal to the ileocecal valve (series 6, image 37). The remaining colon distal to this area is mostly decompressed. History of prior appendectomy. Vascular/Lymphatic: Aortic atherosclerosis. No enlarged abdominal or pelvic lymph nodes. Reproductive: Unchanged prostate median lobe  hypertrophy indenting the bladder base. Other: No abdominal wall hernia or abnormality. Trace ascites. No pneumoperitoneum. Musculoskeletal: No acute or significant osseous findings. Mild anasarca. IMPRESSION: 1. Questionable focal mass-like irregular wall thickening of the cecum beyond the ileocecal valve with mild distention of the cecum and diffusely dilated small bowel with gradual transition to non-dilated terminal ileum. 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 Recommend colonoscopy for further evaluation. 2. Aortic Atherosclerosis (ICD10-I70.0). Electronically Signed   By: Titus Dubin M.D.   On: 10/23/2021 14:26   Labs:   Basic Metabolic Panel: Recent Labs  Lab 11/09/21 0912 11/09/21 0918 11/09/21 2111 11/10/21 0653 11/11/21 0508  NA  --  143 143 142 144  K  --  3.0* 3.4* 3.3* 3.5  CL  --  103 112* 110 111  CO2  --   --  '24 27 27  '$ GLUCOSE  --  125* 163* 120* 100*  BUN  --  '17 16 19 17  '$ CREATININE  --  1.40* 1.13 1.13 1.06  CALCIUM  --   --  7.0* 7.7* 8.1*  MG 2.0  --   --   --   --   PHOS  --   --  2.2*  --   --      CBC: Recent Labs  Lab 11/09/21 0912 11/09/21 0918 11/10/21 0653 11/11/21 0508  WBC 15.9*  --  13.5* 9.7  NEUTROABS 14.5*  --   --   --   HGB 11.4* 11.2* 9.7* 9.8*  HCT 35.4* 33.0* 29.4* 30.2*  MCV 84.3  --  82.6 83.7  PLT 299  --  283 270         SIGNED:   Debbe Odea, MD  [facility] 11/12/2021, 3:36 PM

## 2021-11-12 NOTE — TOC Transition Note (Signed)
Transition of Care [facility]) - CM/SW Discharge Note   Patient Details  Name: Theodore King MRN: [id number] Date of Birth: [birth year]  [facility] of [facility] CM/SW Contact:  Vassie Moselle, LCSW Phone Number: 11/12/2021, 3:50 PM   Clinical Narrative:    Met with patient and confirmed plans to return home with HHPT. Pt has been established for HHPT through [facility] and these services will continue when pt returns home. No DME needs. No further TOC needs identified at this time.    Final next level of care: Home w Home Health Services Barriers to Discharge: No Barriers Identified   Patient Goals and CMS Choice Patient states their goals for this hospitalization and ongoing recovery are:: Return home   Choice offered to / list presented to : Patient  Discharge Placement                       Discharge Plan and Services                DME Arranged: N/A DME Agency: NA       HH Arranged: PT HH Agency: [place] Date [facility] Agency Contacted: 11/12/21 Time [place]: [id number] Representative spoke with at [place]: [name]  Social Determinants of Health ([place]) Interventions     Readmission Risk Interventions     No data to display

## 2021-11-14 LAB — CULTURE, BLOOD (ROUTINE X 2): Culture: NO GROWTH

## 2021-11-18 ENCOUNTER — Emergency Department (HOSPITAL_COMMUNITY): Payer: Medicare (Managed Care)

## 2021-11-18 ENCOUNTER — Other Ambulatory Visit: Payer: Self-pay

## 2021-11-18 ENCOUNTER — Inpatient Hospital Stay (HOSPITAL_COMMUNITY)
Admission: EM | Admit: 2021-11-18 | Discharge: 2021-11-24 | DRG: 312 | Disposition: A | Discharge status: Skilled Nursing Facility | Payer: Medicare (Managed Care) | Attending: Internal Medicine | Admitting: Internal Medicine

## 2021-11-18 DIAGNOSIS — R55 Syncope and collapse: Principal | ICD-10-CM | POA: Diagnosis present

## 2021-11-18 DIAGNOSIS — I951 Orthostatic hypotension: Secondary | ICD-10-CM | POA: Diagnosis not present

## 2021-11-18 DIAGNOSIS — N400 Enlarged prostate without lower urinary tract symptoms: Secondary | ICD-10-CM | POA: Diagnosis present

## 2021-11-18 DIAGNOSIS — E782 Mixed hyperlipidemia: Secondary | ICD-10-CM | POA: Diagnosis present

## 2021-11-18 DIAGNOSIS — E119 Type 2 diabetes mellitus without complications: Secondary | ICD-10-CM | POA: Diagnosis present

## 2021-11-18 DIAGNOSIS — Z8616 Personal history of COVID-19: Secondary | ICD-10-CM

## 2021-11-18 DIAGNOSIS — I700 Atherosclerosis of aorta: Secondary | ICD-10-CM | POA: Diagnosis present

## 2021-11-18 DIAGNOSIS — G20 Parkinson's disease: Secondary | ICD-10-CM | POA: Diagnosis present

## 2021-11-18 DIAGNOSIS — Z801 Family history of malignant neoplasm of trachea, bronchus and lung: Secondary | ICD-10-CM

## 2021-11-18 DIAGNOSIS — Z823 Family history of stroke: Secondary | ICD-10-CM

## 2021-11-18 DIAGNOSIS — I10 Essential (primary) hypertension: Secondary | ICD-10-CM | POA: Diagnosis present

## 2021-11-18 DIAGNOSIS — I2510 Atherosclerotic heart disease of native coronary artery without angina pectoris: Secondary | ICD-10-CM | POA: Diagnosis present

## 2021-11-18 DIAGNOSIS — R471 Dysarthria and anarthria: Secondary | ICD-10-CM | POA: Diagnosis not present

## 2021-11-18 DIAGNOSIS — Z79899 Other long term (current) drug therapy: Secondary | ICD-10-CM

## 2021-11-18 DIAGNOSIS — Z87891 Personal history of nicotine dependence: Secondary | ICD-10-CM

## 2021-11-18 DIAGNOSIS — J449 Chronic obstructive pulmonary disease, unspecified: Secondary | ICD-10-CM | POA: Diagnosis present

## 2021-11-18 DIAGNOSIS — Z751 Person awaiting admission to adequate facility elsewhere: Secondary | ICD-10-CM

## 2021-11-18 LAB — COMPREHENSIVE METABOLIC PANEL
ALT: <5 U/L (ref 0–44)
AST: 19 U/L (ref 15–41)
Albumin: 2.5 g/dL — ABNORMAL LOW (ref 3.5–5.0)
Alkaline Phosphatase: 64 U/L (ref 38–126)
Anion gap: 9 (ref 5–15)
BUN: 13 mg/dL (ref 8–23)
CO2: 28 mmol/L (ref 22–32)
Calcium: 8.6 mg/dL — ABNORMAL LOW (ref 8.9–10.3)
Chloride: 103 mmol/L (ref 98–111)
Creatinine, Ser: 1.14 mg/dL (ref 0.61–1.24)
GFR, Estimated: >60 mL/min (ref >60)
Glucose, Bld: 171 mg/dL — ABNORMAL HIGH (ref 70–99)
Potassium: 4.1 mmol/L (ref 3.5–5.1)
Sodium: 140 mmol/L (ref 135–145)
Total Bilirubin: 1 mg/dL (ref 0.3–1.2)
Total Protein: 6 g/dL — ABNORMAL LOW (ref 6.5–8.1)

## 2021-11-18 LAB — CBC WITH DIFFERENTIAL/PLATELET
Abs Immature Granulocytes: 0.03 10*3/uL (ref 0.00–0.07)
Basophils Absolute: 0 10*3/uL (ref 0.0–0.1)
Basophils Relative: 1 %
Eosinophils Absolute: 0 10*3/uL (ref 0.0–0.5)
Eosinophils Relative: 0 %
HCT: 32 % — ABNORMAL LOW (ref 39.0–52.0)
Hemoglobin: 10.3 g/dL — ABNORMAL LOW (ref 13.0–17.0)
Immature Granulocytes: 0 %
Lymphocytes Relative: 10 %
Lymphs Abs: 0.8 10*3/uL (ref 0.7–4.0)
MCH: 27.3 pg (ref 26.0–34.0)
MCHC: 32.2 g/dL (ref 30.0–36.0)
MCV: 84.9 fL (ref 80.0–100.0)
Monocytes Absolute: 0.3 10*3/uL (ref 0.1–1.0)
Monocytes Relative: 4 %
Neutro Abs: 6.8 10*3/uL (ref 1.7–7.7)
Neutrophils Relative %: 85 %
Platelets: 238 10*3/uL (ref 150–400)
RBC: 3.77 MIL/uL — ABNORMAL LOW (ref 4.22–5.81)
RDW: 14.3 % (ref 11.5–15.5)
WBC: 8 10*3/uL (ref 4.0–10.5)
nRBC: 0 % (ref 0.0–0.2)

## 2021-11-18 LAB — TROPONIN I (HIGH SENSITIVITY)
Troponin I (High Sensitivity): 10 ng/L (ref <18)
Troponin I (High Sensitivity): 18 ng/L — ABNORMAL HIGH (ref <18)

## 2021-11-18 LAB — MAGNESIUM: Magnesium: 2 mg/dL (ref 1.7–2.4)

## 2021-11-18 LAB — PROTIME-INR
INR: 1.1 (ref 0.8–1.2)
Prothrombin Time: 14.5 seconds (ref 11.4–15.2)

## 2021-11-18 MED ORDER — ENOXAPARIN SODIUM 40 MG/0.4ML IJ SOSY
40.0000 mg | PREFILLED_SYRINGE | INTRAMUSCULAR | Status: DC
  Administered 2021-11-18 – 2021-11-23 (×6): 40 mg via SUBCUTANEOUS
  Filled 2021-11-18 (×6): qty 0.4

## 2021-11-18 NOTE — H&P (Signed)
History and Physical    Patient: Theodore King LFY:[id number] DOB: 16-Apr-1946 DOA: 11/18/2021 DOS: the patient was seen and examined on 11/18/2021 PCP: [name], MD  Patient coming from: Home  Chief Complaint:  Chief Complaint  Patient presents with   Loss of Consciousness   HPI: Theodore King is a 76 y.o. male with medical history significant of Parkinson's disease, BPH.  Presents to the emergency department after a witnessed syncopal episode at his primary care physician's office.  Was passed out for 2 minutes.  No fall.  Speech was noted.  No urinary or stool incontinence.  No tonic-clonic or like activity noted.  No tongue lacerations.  Upon my evaluation at bedside he is back to his baseline.  Does not recall events.  No chest pain.  Chest x-ray negative.  CT head negative.  MRI negative.  Troponins unremarkable.  EKG unremarkable.  Signs are stable.  Initially at primary care physician's office and with the EMS blood pressures were soft but now he is normotensive.  Review of Systems: As mentioned in the history of present illness. All other systems reviewed and are negative. 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   SOB (shortness of breath) 03/23/2018   Unspecified vitamin D deficiency    Unspecified vitamin D deficiency    Vertigo, benign paroxysmal 11/05/2016   Vitamin D deficiency 05/18/2013   Past Surgical History:   Procedure Laterality Date   APPENDECTOMY     COLONOSCOPY WITH PROPOFOL N/A 10/26/2021   Procedure: COLONOSCOPY WITH PROPOFOL;  Surgeon: Irene Shipper, MD;  Location: [facility];  Service: Gastroenterology;  Laterality: N/A;   POLYPECTOMY  10/26/2021   Procedure: POLYPECTOMY;  Surgeon: Irene Shipper, MD;  Location: [facility];  Service: Gastroenterology;;   Cherre Robins CUFF REPAIR     right   Social History:  reports that he quit smoking about 47 years ago. His smoking use included cigarettes. He started smoking about 68 years ago. He has a 10.00 pack-year smoking history. He has never used smokeless tobacco. He reports that he does not currently use alcohol. He reports that he does not use drugs.  No Known Allergies  Family History  Problem Relation Age of Onset   Cancer Mother    Cancer Father    Stroke Brother    Cancer Sister    Healthy Daughter    Cancer Sister    Lung cancer Brother    Healthy Daughter    Colon cancer Neg Hx    Esophageal cancer Neg Hx    Rectal cancer Neg Hx    Stomach cancer Neg Hx     Prior to Admission medications   Medication Sig Start Date End Date Taking? Authorizing Provider  acetaminophen (TYLENOL) 500 MG tablet Take 500 mg by mouth every 6 (six) hours as needed for mild pain.   Yes [provider]  albuterol (VENTOLIN HFA) 108 (90 Base) MCG/ACT inhaler Inhale 2 puffs into the lungs every  4 (four) hours as needed for wheezing or shortness of breath.  10/03/18  Yes [provider]  amoxicillin (AMOXIL) 500 MG capsule Take 1 capsule (500 mg total) by mouth every 8 (eight) hours for 6 days. 11/12/21 11/18/21 Yes Rizwan, Eunice Blase, MD  carbidopa-levodopa (SINEMET CR) 50-200 MG tablet Take 1 tablet by mouth at bedtime. 09/24/21  Yes Penumalli, Earlean Polka, MD  carbidopa-levodopa (SINEMET IR) 25-100 MG tablet Take 2 tablets at 7 AM, 2 tablets at 10 AM, 2 tablets at 2 PM and 1 tablet at 6 PM Patient taking differently: Take 1-2 tablets by mouth See  admin instructions. Take 2 tablets by mouth at 7 AM, 10 AM, and 2 PM, then 1 tablet at 6 PM 09/24/21  Yes Penumalli, Earlean Polka, MD  glycopyrrolate (ROBINUL) 1 MG tablet Take 1 tablet (1 mg total) by mouth 2 (two) times daily. 09/24/21  Yes Penumalli, Earlean Polka, MD  sertraline (ZOLOFT) 25 MG tablet Take 1 tablet (25 mg total) by mouth daily. 09/24/21  Yes Penumalli, Earlean Polka, MD  tamsulosin (FLOMAX) 0.4 MG CAPS capsule Take 1 capsule (0.4 mg total) by mouth daily. 04/16/20  Yes Vanessa Kick, MD  traZODone (DESYREL) 50 MG tablet Take 50 mg by mouth at bedtime. 08/17/20  Yes [provider]  amLODipine (NORVASC) 5 MG tablet Take 0.5 tablets (2.5 mg total) by mouth daily. 10/28/21   Georgette Shell, MD    Physical Exam: Vitals:   11/18/21 1430 11/18/21 1445 11/18/21 1500 11/18/21 1710  BP: (!) 168/64 (!) 155/67 (!) 155/61   Pulse: (!) 56 (!) 56 (!) 58   Resp: '13 12 10   '$ Temp:      TempSrc:      SpO2: 100% 100% 100% 100%   Constitutional:      General: He is not in acute distress.    Appearance: Normal appearance. He is well-developed. He is not ill-appearing, toxic-appearing or diaphoretic.  HENT:     Head: Normocephalic and atraumatic.     Right Ear: External ear normal.     Left Ear: External ear normal.     Nose: Nose normal.     Mouth/Throat:     Mouth: Mucous membranes are moist.     Pharynx: Oropharynx is clear.  Eyes:     Extraocular Movements: Extraocular movements intact.     Conjunctiva/sclera: Conjunctivae normal.  Cardiovascular:     Rate and Rhythm: Normal rate and regular rhythm.     Heart sounds: No murmur heard. Pulmonary:     Effort: Pulmonary effort is normal. No respiratory distress.     Breath sounds: Normal breath sounds. No wheezing or rales.  Chest:     Chest wall: No tenderness.  Abdominal:     General: Abdomen is flat.     Palpations: Abdomen is soft.     Tenderness: There is no abdominal tenderness.  Musculoskeletal:        General: No  swelling, tenderness or deformity. Normal range of motion.     Cervical back: Normal range of motion and neck supple.     Right lower leg: No edema.     Left lower leg: No edema.  Skin:    General: Skin is warm and dry.     Coloration: Skin is not jaundiced or pale.  Neurological:     General: No focal deficit present.     Mental Status: He is alert. Mental status is at baseline. He is does not recall the events.  Cranial Nerves: No cranial nerve deficit.     Sensory: No sensory deficit.     Motor: No weakness.     Coordination: Coordination normal.  Psychiatric:        Attention and Perception: Attention normal.        Mood and Affect: Mood normal.        Behavior: Behavior is slowed.  Data Reviewed: labs, imaging, vital signs  Assessment and Plan: Syncope: Witnessed. Obtain ECHO. CT head negative. MRI negative. EKG unremarkable. Monitor on telemetry. PT/OT. Orthostatics BID.  Parkinsons: Continue home Sinemet  HTN: Cont home Norvasc  BPH: Cont home Flomax   Advance Care Planning: Full code  Consults: None  Family Communication: None present at bedside  Severity of Illness: The appropriate patient status for this patient is OBSERVATION. Observation status is judged to be reasonable and necessary in order to provide the required intensity of service to ensure the patient's safety. The patient's presenting symptoms, physical exam findings, and initial radiographic and laboratory data in the context of their medical condition is felt to place them at decreased risk for further clinical deterioration. Furthermore, it is anticipated that the patient will be medically stable for discharge from the hospital within 2 midnights of admission.   Author: Leslee Home, DO 11/18/2021 5:27 PM  For on call review [email or web address].

## 2021-11-18 NOTE — ED Provider Notes (Signed)
[facility] EMERGENCY DEPARTMENT Provider Note   CSN: [id number] Arrival date & time: 11/18/21  1240     History  Chief Complaint  Patient presents with   Loss of Consciousness    PIERSON VANTOL is a 76 y.o. male.  HPI Patient presents for a syncopal episode.  Medical history includes Parkinson's disease, HLD, mixed restrictive and obstructive lung disease, BPH, HTN.  He had a recent hospital admission 1 week ago.  At that time, what started as a routine visit to the ED for a dislodged Foley resulted in a hospitalization for AKI.  During his hospitalization, he was treated for UTI and bacteremia.  He was discharged on Amoxil.  He was noted to have downtrending hemoglobin which was attributed to IV fluid resuscitation.  Today, patient went to outpatient doctor appointment.  While in the waiting room, he had a witnessed syncopal episode.  Bystanders estimate that LOC lasted for 2 minutes.  When he regained consciousness, there was concern of slurred speech.  Patient is amnestic to the event.  When first responders arrived on scene, patient was noted to be hypotensive.  Low blood pressure resolved during transit to the hospital.  Blood sugar was checked prior to arrival and found to be mildly elevated.  Currently, patient denies any physical complaints.    Home Medications Prior to Admission medications   Medication Sig Start Date End Date Taking? Authorizing Provider  acetaminophen (TYLENOL) 500 MG tablet Take 500 mg by mouth every 6 (six) hours as needed for mild pain.   Yes [provider]  albuterol (VENTOLIN HFA) 108 (90 Base) MCG/ACT inhaler Inhale 2 puffs into the lungs every 4 (four) hours as needed for wheezing or shortness of breath.  10/03/18  Yes [provider]  amoxicillin (AMOXIL) 500 MG capsule Take 1 capsule (500 mg total) by mouth every 8 (eight) hours for 6 days. 11/12/21 11/18/21 Yes Rizwan, Eunice Blase, MD  carbidopa-levodopa (SINEMET CR)  50-200 MG tablet Take 1 tablet by mouth at bedtime. 09/24/21  Yes Penumalli, Earlean Polka, MD  carbidopa-levodopa (SINEMET IR) 25-100 MG tablet Take 2 tablets at 7 AM, 2 tablets at 10 AM, 2 tablets at 2 PM and 1 tablet at 6 PM Patient taking differently: Take 1-2 tablets by mouth See admin instructions. Take 2 tablets by mouth at 7 AM, 10 AM, and 2 PM, then 1 tablet at 6 PM 09/24/21  Yes Penumalli, Earlean Polka, MD  glycopyrrolate (ROBINUL) 1 MG tablet Take 1 tablet (1 mg total) by mouth 2 (two) times daily. 09/24/21  Yes Penumalli, Earlean Polka, MD  sertraline (ZOLOFT) 25 MG tablet Take 1 tablet (25 mg total) by mouth daily. 09/24/21  Yes Penumalli, Earlean Polka, MD  tamsulosin (FLOMAX) 0.4 MG CAPS capsule Take 1 capsule (0.4 mg total) by mouth daily. 04/16/20  Yes Vanessa Kick, MD  traZODone (DESYREL) 50 MG tablet Take 50 mg by mouth at bedtime. 08/17/20  Yes [provider]  amLODipine (NORVASC) 5 MG tablet Take 0.5 tablets (2.5 mg total) by mouth daily. 10/28/21   Georgette Shell, MD      Allergies    Patient has no known allergies.    Review of Systems   Review of Systems  Neurological:  Positive for syncope and speech difficulty (Reported by bystanders following syncopal episode).  All other systems reviewed and are negative.   Physical Exam Updated Vital Signs BP (!) 155/61   Pulse (!) 58   Temp (!) 97.4 F (  36.3 C) (Oral)   Resp 10   SpO2 100%  Physical Exam Vitals and nursing note reviewed.  Constitutional:      General: He is not in acute distress.    Appearance: Normal appearance. He is well-developed. He is not ill-appearing, toxic-appearing or diaphoretic.  HENT:     Head: Normocephalic and atraumatic.     Right Ear: External ear normal.     Left Ear: External ear normal.     Nose: Nose normal.     Mouth/Throat:     Mouth: Mucous membranes are moist.     Pharynx: Oropharynx is clear.  Eyes:     Extraocular Movements: Extraocular movements intact.      Conjunctiva/sclera: Conjunctivae normal.  Cardiovascular:     Rate and Rhythm: Normal rate and regular rhythm.     Heart sounds: No murmur heard. Pulmonary:     Effort: Pulmonary effort is normal. No respiratory distress.     Breath sounds: Normal breath sounds. No wheezing or rales.  Chest:     Chest wall: No tenderness.  Abdominal:     General: Abdomen is flat.     Palpations: Abdomen is soft.     Tenderness: There is no abdominal tenderness.  Musculoskeletal:        General: No swelling, tenderness or deformity. Normal range of motion.     Cervical back: Normal range of motion and neck supple.     Right lower leg: No edema.     Left lower leg: No edema.  Skin:    General: Skin is warm and dry.     Coloration: Skin is not jaundiced or pale.  Neurological:     General: No focal deficit present.     Mental Status: He is alert. Mental status is at baseline. He is disoriented.     Cranial Nerves: No cranial nerve deficit.     Sensory: No sensory deficit.     Motor: No weakness.     Coordination: Coordination normal.  Psychiatric:        Attention and Perception: Attention normal.        Mood and Affect: Mood normal.        Behavior: Behavior is slowed. Behavior is cooperative.        Judgment: Judgment normal.     ED Results / Procedures / Treatments   Labs (all labs ordered are listed, but only abnormal results are displayed) Labs Reviewed  CBC WITH DIFFERENTIAL/PLATELET - Abnormal; Notable for the following components:      Result Value   RBC 3.77 (*)    Hemoglobin 10.3 (*)    HCT 32.0 (*)    All other components within normal limits  COMPREHENSIVE METABOLIC PANEL - Abnormal; Notable for the following components:   Glucose, Bld 171 (*)    Calcium 8.6 (*)    Total Protein 6.0 (*)    Albumin 2.5 (*)    All other components within normal limits  TROPONIN I (HIGH SENSITIVITY) - Abnormal; Notable for the following components:   Troponin I (High Sensitivity) 18 (*)     All other components within normal limits  PROTIME-INR  MAGNESIUM  TROPONIN I (HIGH SENSITIVITY)    EKG EKG Interpretation  Date/Time:  Wednesday November 18 2021 12:50:13 EDT Ventricular Rate:  61 PR Interval:  225 QRS Duration: 97 QT Interval:  443 QTC Calculation: 447 R Axis:   26 Text Interpretation: Sinus rhythm Prolonged PR interval Confirmed by Godfrey Pick [phone]) on 11/18/2021  1:07:06 PM  Radiology MR BRAIN WO CONTRAST  Result Date: 11/18/2021 CLINICAL DATA:  Unresponsive episode lasting approximately 2 minutes with possible slurred speech. EXAM: MRI HEAD WITHOUT CONTRAST TECHNIQUE: Multiplanar, multiecho pulse sequences of the brain and surrounding structures were obtained without intravenous contrast. COMPARISON:  CT head obtained earlier the same day, MR head 05/04/2021. FINDINGS: Brain: There is no acute intracranial hemorrhage, extra-axial fluid collection, or acute infarct There is mild global parenchymal volume loss with prominence of the ventricular system and extra-axial CSF spaces. The ventricles are stable in size compared to the prior brain MRI from 2022. There is mild FLAIR signal abnormality in the periventricular white matter likely reflecting sequela of mild chronic white matter microangiopathy. There is no suspicious parenchymal signal abnormality. There is no mass lesion. There is no mass effect or midline shift. Vascular: Normal flow voids. Skull and upper cervical spine: Normal marrow signal. Sinuses/Orbits: The paranasal sinuses are clear. Bilateral lens implants are in place. The globes and orbits are otherwise unremarkable. Other: None. IMPRESSION: No acute intracranial pathology. No significant interval change since the brain MRI from 05/04/2021. Electronically Signed   By: Valetta Mole M.D.   On: 11/18/2021 16:35   CT Head Wo Contrast  Result Date: 11/18/2021 CLINICAL DATA:  Syncope EXAM: CT HEAD WITHOUT CONTRAST TECHNIQUE: Contiguous axial images were obtained from  the base of the skull through the vertex without intravenous contrast. RADIATION DOSE REDUCTION: This exam was performed according to the departmental dose-optimization program which includes automated exposure control, adjustment of the mA and/or kV according to patient size and/or use of iterative reconstruction technique. COMPARISON:  CT head 08/30/2021 FINDINGS: Brain: No acute intracranial hemorrhage, mass effect, or herniation. No extra-axial fluid collections. No evidence of acute territorial infarct. No hydrocephalus. Moderate cortical volume loss. Patchy hypodensities in the periventricular and subcortical white matter, likely secondary to chronic microvascular ischemic changes. Vascular: No hyperdense vessel or unexpected calcification. Skull: Normal. Negative for fracture or focal lesion. Sinuses/Orbits: No acute finding. Other: None. IMPRESSION: Chronic changes with no acute intracranial process identified. Electronically Signed   By: Ofilia Neas M.D.   On: 11/18/2021 13:50   DG Chest Port 1 View  Result Date: 11/18/2021 CLINICAL DATA:  Syncope, shortness of breath. EXAM: PORTABLE CHEST 1 VIEW COMPARISON:  11/09/2021 and CT chest 08/22/2020. FINDINGS: Trachea is midline. Heart size stable. Thoracic aorta is calcified. No airspace consolidation or pleural fluid. Chronically elevated left hemidiaphragm. Degenerative changes in the left shoulder. IMPRESSION: No acute findings. Electronically Signed   By: Lorin Picket M.D.   On: 11/18/2021 13:15    Procedures Procedures    Medications Ordered in ED Medications - No data to display  ED Course/ Medical Decision Making/ A&P                           Medical Decision Making Amount and/or Complexity of Data Reviewed Labs: ordered. Radiology: ordered.  Risk Decision regarding hospitalization.   This patient presents to the ED for concern of syncope, this involves an extensive number of treatment options, and is a complaint that  carries with it a high risk of complications and morbidity.  The differential diagnosis includes arrhythmia, CHF, hypoglycemia, CVA, TIA, seizure, dehydration, vasovagal episode   Co morbidities that complicate the patient evaluation  Parkinson's disease, HLD, mixed restrictive and obstructive lung disease, BPH, HTN   Additional history obtained:  Additional history obtained from EMS External records from outside source obtained  and reviewed including EMR   Lab Tests:  I Ordered, and personally interpreted labs.  The pertinent results include: Baseline anemia, no leukocytosis, normal glucose, normal electrolytes, mildly elevated troponin   Imaging Studies ordered:  I ordered imaging studies including chest x-ray, CT head, MRI brain I independently visualized and interpreted imaging which showed no acute findings I agree with the radiologist interpretation   Cardiac Monitoring: / EKG:  The patient was maintained on a cardiac monitor.  I personally viewed and interpreted the cardiac monitored which showed an underlying rhythm of: Sinus rhythm   Problem List / ED Course / Critical interventions / Medication management  Patient is a 77 year old male with history of Parkinson's, and recent hospitalization for AKI, presenting for a witnessed episode of syncope.  This occurred at his primary care doctor's office waiting room.  It was witnessed by bystanders.  Patient is amnestic to the event.  Bystanders estimate 2-minute LOC.  Patient had confusion and difficulty with speech following the episode.  Patient arrives via EMS.  On arrival, patient denies any physical complaints.  Although blood pressure was noted to be low on scene, blood pressure is mildly elevated on arrival.  Blood sugar prior to arrival and on arrival was found to be normal.  Patient was kept on bedside cardiac monitor.  Laboratory work-up was initiated.  Patient underwent CT imaging of head which showed no acute findings.   Given report of slurred speech following a syncopal episode, patient was evaluated for TIA.  On MRI brain, no acute findings were identified.  On review of bedside cardiac monitor data, patient maintained normal sinus rhythm with no evidence of arrhythmias while in the ED.  Per chart review, patient's has no documented echocardiogram.  Patient is agreeable to admission for further syncope work-up.  Patient was admitted to hospitalist for further management.   Social Determinants of Health:  Recent hospital admission         Final Clinical Impression(s) / ED Diagnoses Final diagnoses:  Syncope, unspecified syncope type    Rx / DC Orders ED Discharge Orders     None         Godfrey Pick, MD 11/18/21 1706

## 2021-11-18 NOTE — ED Notes (Signed)
The pt returned from [place] gave the pt a sandwich  he is sl confused  but  co-operative

## 2021-11-18 NOTE — ED Triage Notes (Signed)
Pt here via [facility] from pcp office. Per bystanders pt had an "unresponsive episode" lasting approx 2 min, and maybe had slurred speech after but bystanders did not know pt's baseline. Per fire dept initial BP was 80/50, upon EMS arrival BP 100/64. Pt is aox3, disoriented to time and does not remember what happened earlier today. 18g RAC. 80HR, CBG 220

## 2021-11-19 ENCOUNTER — Observation Stay (HOSPITAL_COMMUNITY): Payer: Medicare (Managed Care)

## 2021-11-19 DIAGNOSIS — R55 Syncope and collapse: Secondary | ICD-10-CM | POA: Diagnosis not present

## 2021-11-19 LAB — ECHOCARDIOGRAM COMPLETE
AR max vel: 6.71 cm2
AV Area VTI: 6.53 cm2
AV Area mean vel: 6.37 cm2
AV Mean grad: 5 mmHg
AV Peak grad: 10.5 mmHg
Ao pk vel: 1.62 m/s
Area-P 1/2: 2.48 cm2
Calc EF: 65 %
MV VTI: 7.43 cm2
P 1/2 time: 506 msec
S' Lateral: 3.1 cm
Single Plane A2C EF: 64.6 %
Single Plane A4C EF: 66 %

## 2021-11-19 MED ORDER — CARBIDOPA-LEVODOPA ER 50-200 MG PO TBCR
1.0000 | EXTENDED_RELEASE_TABLET | Freq: Every day | ORAL | Status: DC
  Administered 2021-11-19 – 2021-11-23 (×5): 1 via ORAL
  Filled 2021-11-19 (×7): qty 1

## 2021-11-19 MED ORDER — AMLODIPINE BESYLATE 5 MG PO TABS
5.0000 mg | ORAL_TABLET | Freq: Every day | ORAL | Status: DC
  Administered 2021-11-19 – 2021-11-24 (×5): 5 mg via ORAL
  Filled 2021-11-19 (×5): qty 1

## 2021-11-19 MED ORDER — TRAZODONE HCL 50 MG PO TABS
50.0000 mg | ORAL_TABLET | Freq: Every day | ORAL | Status: DC
  Administered 2021-11-19 – 2021-11-23 (×5): 50 mg via ORAL
  Filled 2021-11-19 (×5): qty 1

## 2021-11-19 MED ORDER — GLYCOPYRROLATE 1 MG PO TABS
1.0000 mg | ORAL_TABLET | Freq: Two times a day (BID) | ORAL | Status: DC
  Administered 2021-11-19 – 2021-11-24 (×11): 1 mg via ORAL
  Filled 2021-11-19 (×12): qty 1

## 2021-11-19 MED ORDER — CARBIDOPA-LEVODOPA 25-100 MG PO TABS
2.0000 | ORAL_TABLET | ORAL | Status: DC
  Administered 2021-11-19: 2 via ORAL
  Filled 2021-11-19 (×2): qty 2

## 2021-11-19 MED ORDER — CARBIDOPA-LEVODOPA 25-100 MG PO TABS
2.0000 | ORAL_TABLET | ORAL | Status: DC
  Administered 2021-11-21 – 2021-11-24 (×12): 2 via ORAL
  Filled 2021-11-19 (×12): qty 2

## 2021-11-19 MED ORDER — TAMSULOSIN HCL 0.4 MG PO CAPS
0.4000 mg | ORAL_CAPSULE | Freq: Every day | ORAL | Status: DC
  Administered 2021-11-19 – 2021-11-24 (×6): 0.4 mg via ORAL
  Filled 2021-11-19 (×6): qty 1

## 2021-11-19 MED ORDER — ALBUTEROL SULFATE (2.5 MG/3ML) 0.083% IN NEBU: 3.0000 mL | INHALATION_SOLUTION | RESPIRATORY_TRACT | Status: DC | PRN

## 2021-11-19 MED ORDER — CARBIDOPA-LEVODOPA 25-100 MG PO TABS
1.0000 | ORAL_TABLET | Freq: Every day | ORAL | Status: DC
  Administered 2021-11-19 – 2021-11-23 (×5): 1 via ORAL
  Filled 2021-11-19 (×4): qty 1

## 2021-11-19 MED ORDER — SODIUM CHLORIDE 0.9 % IV SOLN: INTRAVENOUS | Status: DC

## 2021-11-19 MED ORDER — CHLORHEXIDINE GLUCONATE CLOTH 2 % EX PADS
6.0000 | MEDICATED_PAD | Freq: Every day | CUTANEOUS | Status: DC
  Administered 2021-11-19 – 2021-11-24 (×6): 6 via TOPICAL

## 2021-11-19 MED ORDER — SERTRALINE HCL 25 MG PO TABS
25.0000 mg | ORAL_TABLET | Freq: Every day | ORAL | Status: DC
  Administered 2021-11-19 – 2021-11-24 (×6): 25 mg via ORAL
  Filled 2021-11-19 (×7): qty 1

## 2021-11-19 NOTE — Progress Notes (Signed)
  Echocardiogram 2D Echocardiogram has been performed.  Theodore King 11/19/2021, 9:55 AM

## 2021-11-19 NOTE — Evaluation (Signed)
Physical Therapy Evaluation Patient Details Name: Theodore King MRN: [id number] DOB: 11-Jul-1945 Today's Date: 11/19/2021  History of Present Illness  Pt adm 6/14 after witnessed syncopal episode at MD's office. MRI neg. PMH - Parkinsons, BPH  Clinical Impression  Pt presents with a decr in mobility especially with transitional movements like supine to sit and sit to stand. Once up on feet mobility didn't require as much assistance. Currently pt needs assist every time he gets up and he was not requiring that prior. May need SNF unless family can provide assistance with mobility for him at home.        Recommendations for follow up therapy are one component of a multi-disciplinary discharge planning process, led by the attending physician.  Recommendations may be updated based on patient status, additional functional criteria and insurance authorization.  Follow Up Recommendations Skilled nursing-short term rehab (<3 hours/day) (if family can physically assist with mobilty could go home with f/u at [facility])    Assistance Recommended at Discharge Frequent or constant Supervision/Assistance  Patient can return home with the following  A lot of help with walking and/or transfers;Assist for transportation;Help with stairs or ramp for entrance    Equipment Recommendations None recommended by PT  Recommendations for Other Services       Functional Status Assessment Patient has had a recent decline in their functional status and demonstrates the ability to make significant improvements in function in a reasonable and predictable amount of time.     Precautions / Restrictions Precautions Precautions: Fall Restrictions Weight Bearing Restrictions: No      Mobility  Bed Mobility Overal bed mobility: Needs Assistance Bed Mobility: Supine to Sit     Supine to sit: Mod assist     General bed mobility comments: Assist to elevate trunk into supine and bring hips to EOB     Transfers Overall transfer level: Needs assistance Equipment used: Rolling walker (2 wheels), Ambulation equipment used Transfers: Sit to/from Stand, Bed to chair/wheelchair/BSC Sit to Stand: Max assist, Mod assist, Min assist           General transfer comment: Initially stood from slightly elevated bed with walker and required max assist due to heavy posterior lean. Brought Stedy in and elevate the bed more and pt able to come to stand with min assist. Used Stedy to go bed to chair. From chair stood with [facility] with min assist. Then used walker to stand and required mod assist to rise due to posterior lean. Transfer via Lift Equipment: Stedy  Ambulation/Gait Ambulation/Gait assistance: Herbalist (Feet): 90 Feet Assistive device: Rolling walker (2 wheels) Gait Pattern/deviations: Step-through pattern, Decreased step length - right, Decreased step length - left, Shuffle, Trunk flexed Gait velocity: decr Gait velocity interpretation: <1.31 ft/sec, indicative of household ambulator   General Gait Details: Assist for balance and support. Cues to incr step length especially with turns  Stairs            Wheelchair Mobility    Modified Rankin (Stroke Patients Only)       Balance Overall balance assessment: Needs assistance Sitting-balance support: Feet supported Sitting balance-Leahy Scale: Fair     Standing balance support: Bilateral upper extremity supported Standing balance-Leahy Scale: Poor Standing balance comment: walker and min assist for static standing once standing achieved                             Pertinent Vitals/Pain Pain Assessment  Pain Assessment: No/denies pain    Home Living Family/patient expects to be discharged to:: Private residence Living Arrangements: Children (daughter AND SON) Available Help at Discharge: Family;Available PRN/intermittently Type of Home: House Home Access: Stairs to enter Entrance  Stairs-Rails: Psychiatric nurse of Steps: 3   Home Layout: One level Home Equipment: Conservation officer, nature (2 wheels);Cane - single point;Grab bars - tub/shower;BSC/3in1 Additional Comments: daughter works during day. Pt goes to [facility] days a week [facility] 8 hours/wk.    Prior Function Prior Level of Function : Independent/Modified Independent;Needs assist             Mobility Comments: SPC at baseline; independent ADLs Comments: Assist with bathing, dressing assist; able to feed self and perform grooming     Hand Dominance   Dominant Hand: Left    Extremity/Trunk Assessment   Upper Extremity Assessment Upper Extremity Assessment: Defer to OT evaluation    Lower Extremity Assessment Lower Extremity Assessment: Generalized weakness    Cervical / Trunk Assessment Cervical / Trunk Assessment: Kyphotic  Communication   Communication: No difficulties  Cognition Arousal/Alertness: Awake/alert Behavior During Therapy: WFL for tasks assessed/performed Overall Cognitive Status: Within Functional Limits for tasks assessed                                          General Comments General comments (skin integrity, edema, etc.): BP supine: 164/67, 65 BPM; sitting 159/64, 65 BPM standing: 108/57, 67    Exercises     Assessment/Plan    PT Assessment Patient needs continued PT services  PT Problem List Decreased strength;Decreased balance;Decreased mobility       PT Treatment Interventions DME instruction;Gait training;Functional mobility training;Therapeutic activities;Stair training;Therapeutic exercise;Balance training;Patient/family education    PT Goals (Current goals can be found in the Care Plan section)  Acute Rehab PT Goals PT Goal Formulation: With patient Time For Goal Achievement: 12/03/21 Potential to Achieve Goals: Good    Frequency Min 3X/week     Co-evaluation               AM-PAC PT "6 Clicks" Mobility  Outcome Measure  Help needed turning from your back to your side while in a flat bed without using bedrails?: A Lot Help needed moving from lying on your back to sitting on the side of a flat bed without using bedrails?: A Lot Help needed moving to and from a bed to a chair (including a wheelchair)?: A Lot Help needed standing up from a chair using your arms (e.g., wheelchair or bedside chair)?: A Lot Help needed to walk in hospital room?: A Little Help needed climbing 3-5 steps with a railing? : Total 6 Click Score: 12    End of Session Equipment Utilized During Treatment: Gait belt Activity Tolerance: Patient tolerated treatment well Patient left: in bed;with call bell/phone within reach;with chair alarm set Nurse Communication: Mobility status;Need for lift equipment PT Visit Diagnosis: Unsteadiness on feet (R26.81);Other abnormalities of gait and mobility (R26.89);Other symptoms and signs involving the nervous system (R29.898)    Time: 1431-1510 PT Time Calculation (min) (ACUTE ONLY): 39 min   Charges:   PT Evaluation $PT Eval Moderate Complexity: 1 Mod PT Treatments $Gait Training: 23-37 mins        [place] Office [place] 11/19/2021, 4:02 PM

## 2021-11-19 NOTE — ED Notes (Signed)
The SW for Theodore King in 14 called asking to talk to the RN taking care of him. The SW is with [facility]. Her munber is [phone].

## 2021-11-19 NOTE — ED Notes (Addendum)
  The pt isleeping

## 2021-11-19 NOTE — Progress Notes (Signed)
  Progress Note   Patient: Theodore King DHR:[id number] DOB: 11/10/1945 DOA: 11/18/2021     0 DOS: the patient was seen and examined on 11/19/2021   Brief hospital course: EFRAIN CLAUSON is a 76 y.o. male with medical history significant of Parkinson's disease, BPH.   Presents to the emergency department after a witnessed syncopal episode at his primary care physician's office.  Was passed out for 2 minutes.  No fall.  Speech was noted.  No urinary or stool incontinence.  No tonic-clonic or like activity noted.  No tongue lacerations.  Assessment and Plan: Syncope: 2/2 orthostatic hypotension. Was partially orthostatic with OT today. Discussed with neurology. Will attempt supportive treatment including IV fluids, abdominal binder, compression socks, and slowly getting up. ECHO pending. MRI was negative. On telemetry no arrhythmogenic events noted   BPH: Continue on Flomax   hypertension: Continue Norvasc  Parkinson's: Continue home Sinemet   Subjective: Resting in bed.  No acute overnight events.  No acute complaints.  Physical Exam: Vitals:   11/19/21 1100 11/19/21 1200 11/19/21 1300 11/19/21 1551  BP: (!) 159/64 (!) 143/61 (!) 126/97   Pulse: 65 62 63 62  Resp: '19 14 11   '$ Temp:    97.8 F (36.6 C)  TempSrc:    Oral  SpO2: 92% 100% 100% 98%   No acute distress Regular rate and rhythm Lungs clear to auscultation Abdomen soft nontender Alert and oriented x3.  5 out of 5 muscle strength throughout  Data Reviewed: Labs, imaging, vital signs  Family Communication: No family present at bedside  Disposition: Status is: Observation The patient remains OBS appropriate and will d/c before 2 midnights.  [facility] Destination:  [facility]  Time spent: 55 minutes  Author: Leslee Home, DO 11/19/2021 6:08 PM  For on call review [email or web address].

## 2021-11-19 NOTE — ED Notes (Signed)
The  pt is alert his position was changed and more blankets spread oout

## 2021-11-19 NOTE — Evaluation (Signed)
Occupational Therapy Evaluation Patient Details Name: Theodore King MRN: [id number] DOB: 1946-05-23 Today's Date: 11/19/2021   History of Present Illness Pt is a 76 yo male s/p syncopal episode passed out x2 mins. MRI brain: negative. Pmhx: parkinson's dx, BPH.   Clinical Impression   Pt PTA: Pt reports near independence with ADL other than bathing- assist needed when pt is too fatigued due to Parkinson's dx. Pt using SPC for mobility. [facility] for 8 hours/day. Pt currently limited by decreased strength, balance and activity tolerance. Pt set-upA to [place] for ADL at this time. Pt performing bed mobility with modA overall; sit to stand with maxA and taking no steps due to posterior lean with RW. No family in room. Pt did not report dizziness: BP supine: 164/67, 65 BPM; sitting 159/64, 65 BPM standing: 108/57, 67 Pt would benefit from continued OT skilled services. OT following acutely.     Recommendations for follow up therapy are one component of a multi-disciplinary discharge planning process, led by the attending physician.  Recommendations may be updated based on patient status, additional functional criteria and insurance authorization.   Follow Up Recommendations  Skilled nursing-short term rehab (<3 hours/day)    Assistance Recommended at Discharge Frequent or constant Supervision/Assistance  Patient can return home with the following A lot of help with walking and/or transfers;A lot of help with bathing/dressing/bathroom;Assistance with cooking/housework;Direct supervision/assist for medications management;Assist for transportation    Functional Status Assessment  Patient has had a recent decline in their functional status and demonstrates the ability to make significant improvements in function in a reasonable and predictable amount of time.  Equipment Recommendations  BSC/3in1    Recommendations for Other Services       Precautions / Restrictions  Precautions Precautions: Fall Restrictions Weight Bearing Restrictions: No      Mobility Bed Mobility Overal bed mobility: Needs Assistance Bed Mobility: Supine to Sit, Sit to Supine     Supine to sit: Mod assist Sit to supine: Max assist   General bed mobility comments: assist for LEs onto bed    Transfers Overall transfer level: Needs assistance Equipment used: Rolling walker (2 wheels) Transfers: Sit to/from Stand Sit to Stand: Max assist           General transfer comment: cues for hand placement and sequencing      Balance Overall balance assessment: Needs assistance Sitting-balance support: Feet supported Sitting balance-Leahy Scale: Fair     Standing balance support: Bilateral upper extremity supported Standing balance-Leahy Scale: Poor Standing balance comment: unable to take steps or weight shift; pt leaning on bed                           ADL either performed or assessed with clinical judgement   ADL Overall ADL's : Needs assistance/impaired Eating/Feeding: Set up;Sitting   Grooming: Set up;Sitting   Upper Body Bathing: Minimal assistance;Sitting   Lower Body Bathing: Maximal assistance;Sitting/lateral leans;Sit to/from stand;Cueing for sequencing;Cueing for safety   Upper Body Dressing : Minimal assistance;Sitting   Lower Body Dressing: Maximal assistance;Cueing for safety;Cueing for sequencing;Sitting/lateral leans;Sit to/from stand   Toilet Transfer: Maximal assistance;+2 for physical assistance;+2 for safety/equipment;Rolling walker (2 wheels) Toilet Transfer Details (indicate cue type and reason): Pt unable to stand without posterior lean unsafe to take steps Toileting- Clothing Manipulation and Hygiene: Total assistance Toileting - Clothing Manipulation Details (indicate cue type and reason): catheter     Functional mobility during ADLs: Maximal  assistance;+2 for physical assistance;+2 for safety/equipment;Rolling walker (2  wheels);Cueing for sequencing;Cueing for safety General ADL Comments: Pt limited by decreased strength, balance and activity tolerance. Pt set-upA to [place] for ADL at this time. No family in room.     Vision Baseline Vision/History: 0 No visual deficits Ability to See in Adequate Light: 0 Adequate Patient Visual Report: No change from baseline Vision Assessment?: No apparent visual deficits     Perception     Praxis      Pertinent Vitals/Pain Pain Assessment Pain Assessment: No/denies pain     Hand Dominance Left   Extremity/Trunk Assessment Upper Extremity Assessment Upper Extremity Assessment: Generalized weakness   Lower Extremity Assessment Lower Extremity Assessment: Generalized weakness   Cervical / Trunk Assessment Cervical / Trunk Assessment: Kyphotic   Communication Communication Communication: No difficulties   Cognition Arousal/Alertness: Awake/alert Behavior During Therapy: WFL for tasks assessed/performed Overall Cognitive Status: No family/caregiver present to determine baseline cognitive functioning                                 General Comments: Pt slow to initiate response.     General Comments  BP supine: 164/67, 65 BPM; sitting 159/64, 65 BPM standing: 108/57, 67    Exercises     Shoulder Instructions      Home Living Family/patient expects to be discharged to:: Private residence Living Arrangements: Children (daughter AND SON) Available Help at Discharge: Family;Available PRN/intermittently Type of Home: House Home Access: Stairs to enter [employer] of Steps: 3 Entrance Stairs-Rails: Right;Left Home Layout: One level     Bathroom Shower/Tub: Teacher, early years/pre: Standard     Home Equipment: Conservation officer, nature (2 wheels);Cane - single point;Grab bars - tub/shower;BSC/3in1   Additional Comments: daughter works during day. Pt goes to 2-3 days a week [facility] 8 hours/wk.      Prior  Functioning/Environment Prior Level of Function : Independent/Modified Independent;Needs assist             Mobility Comments: SPC at baseline; independent ADLs Comments: Assist with bathing, dressing assist; able to feed self and perform grooming        OT Problem List: Decreased strength;Decreased activity tolerance;Impaired balance (sitting and/or standing);Decreased coordination;Decreased cognition;Decreased safety awareness;Pain      OT Treatment/Interventions: Self-care/ADL training;Therapeutic exercise;Energy conservation;DME and/or AE instruction;Therapeutic activities;Patient/family education;Balance training    OT Goals(Current goals can be found in the care plan section) Acute Rehab OT Goals Patient Stated Goal: to get better OT Goal Formulation: With patient Time For Goal Achievement: 12/03/21 Potential to Achieve Goals: Good ADL Goals Pt Will Transfer to Toilet: with min assist;stand pivot transfer Additional ADL Goal #1: Pt will increase to modA overall for ADL tasks. Additional ADL Goal #2: pt will stand x3 mins with fair balance for OOB ADL.  OT Frequency: Min 2X/week    Co-evaluation              AM-PAC OT "6 Clicks" Daily Activity     Outcome Measure Help from another person eating meals?: A Little Help from another person taking care of personal grooming?: A Little Help from another person toileting, which includes using toliet, bedpan, or urinal?: Total Help from another person bathing (including washing, rinsing, drying)?: A Lot Help from another person to put on and taking off regular upper body clothing?: A Lot Help from another person to put on and taking off regular lower body clothing?:  A Lot 6 Click Score: 13   End of Session Equipment Utilized During Treatment: Gait belt Nurse Communication: Mobility status  Activity Tolerance: Patient limited by fatigue Patient left: in bed;with call bell/phone within reach  OT Visit Diagnosis:  Unsteadiness on feet (R26.81);Muscle weakness (generalized) (M62.81)                Time: 5217-4715 OT Time Calculation (min): 31 min Charges:  OT General Charges $OT Visit: 1 Visit OT Evaluation $OT Eval Moderate Complexity: 1 Mod OT Treatments $Self Care/Home Management : 8-22 mins  Jefferey Pica, OTR/L Acute Rehabilitation Services Office: [phone]   [id number] S [id number] 11/19/2021, 3:06 PM

## 2021-11-20 ENCOUNTER — Inpatient Hospital Stay (HOSPITAL_COMMUNITY): Payer: Medicare (Managed Care)

## 2021-11-20 ENCOUNTER — Observation Stay (HOSPITAL_COMMUNITY): Payer: Medicare (Managed Care)

## 2021-11-20 DIAGNOSIS — I10 Essential (primary) hypertension: Secondary | ICD-10-CM | POA: Diagnosis not present

## 2021-11-20 DIAGNOSIS — Z978 Presence of other specified devices: Secondary | ICD-10-CM | POA: Diagnosis not present

## 2021-11-20 DIAGNOSIS — E119 Type 2 diabetes mellitus without complications: Secondary | ICD-10-CM | POA: Diagnosis not present

## 2021-11-20 DIAGNOSIS — E782 Mixed hyperlipidemia: Secondary | ICD-10-CM | POA: Diagnosis not present

## 2021-11-20 DIAGNOSIS — Z87891 Personal history of nicotine dependence: Secondary | ICD-10-CM | POA: Diagnosis not present

## 2021-11-20 DIAGNOSIS — Z751 Person awaiting admission to adequate facility elsewhere: Secondary | ICD-10-CM | POA: Diagnosis not present

## 2021-11-20 DIAGNOSIS — I951 Orthostatic hypotension: Secondary | ICD-10-CM

## 2021-11-20 DIAGNOSIS — Z823 Family history of stroke: Secondary | ICD-10-CM | POA: Diagnosis not present

## 2021-11-20 DIAGNOSIS — J449 Chronic obstructive pulmonary disease, unspecified: Secondary | ICD-10-CM | POA: Diagnosis not present

## 2021-11-20 DIAGNOSIS — I700 Atherosclerosis of aorta: Secondary | ICD-10-CM | POA: Diagnosis not present

## 2021-11-20 DIAGNOSIS — G20 Parkinson's disease: Secondary | ICD-10-CM | POA: Diagnosis not present

## 2021-11-20 DIAGNOSIS — Z801 Family history of malignant neoplasm of trachea, bronchus and lung: Secondary | ICD-10-CM | POA: Diagnosis not present

## 2021-11-20 DIAGNOSIS — I2510 Atherosclerotic heart disease of native coronary artery without angina pectoris: Secondary | ICD-10-CM | POA: Diagnosis present

## 2021-11-20 DIAGNOSIS — R471 Dysarthria and anarthria: Secondary | ICD-10-CM | POA: Diagnosis not present

## 2021-11-20 DIAGNOSIS — Z8616 Personal history of COVID-19: Secondary | ICD-10-CM | POA: Diagnosis not present

## 2021-11-20 DIAGNOSIS — Z79899 Other long term (current) drug therapy: Secondary | ICD-10-CM | POA: Diagnosis not present

## 2021-11-20 DIAGNOSIS — R55 Syncope and collapse: Secondary | ICD-10-CM | POA: Diagnosis present

## 2021-11-20 DIAGNOSIS — N400 Enlarged prostate without lower urinary tract symptoms: Secondary | ICD-10-CM | POA: Diagnosis not present

## 2021-11-20 LAB — GLUCOSE, CAPILLARY: Glucose-Capillary: 91 mg/dL (ref 70–99)

## 2021-11-20 MED ORDER — IOHEXOL 350 MG/ML SOLN
100.0000 mL | Freq: Once | INTRAVENOUS | Status: AC | PRN
  Administered 2021-11-20: 100 mL via INTRAVENOUS

## 2021-11-20 NOTE — TOC Initial Note (Signed)
Transition of Care [facility]) - Initial/Assessment Note    Patient Details  Name: Theodore King MRN: [id number] Date of Birth: 1945/11/28  [facility]) CM/SW Contact:    Benard Halsted, LCSW Phone Number: 11/20/2021, 9:20 AM  Clinical Narrative:                 CSW spoke with patient's [facility] SW [phone]) regarding SNF recommendation from therapy. She stated she will let the team know and patient can receive therapies through [facility] instead of SNF. CSW will keep her updated once discharge date is known.   Expected Discharge Plan: [place] ([facility]) Barriers to Discharge: Continued Medical Work up   Patient Goals and CMS Choice Patient states their goals for this hospitalization and ongoing recovery are:: Return home CMS Medicare.gov Compare Post Acute Care list provided to:: Patient Choice offered to / list presented to : Patient  Expected Discharge Plan and Services Expected Discharge Plan: [place] ([facility]) In-house Referral: Clinical Social Work   Post Acute Care Choice:  ([facility]) Living arrangements for the past 2 months: [place]                                      Prior Living Arrangements/Services Living arrangements for the past 2 months: Single Family Home   Patient language and need for interpreter reviewed:: Yes Do you feel safe going back to the place where you live?: Yes      Need for Family Participation in Patient Care: Yes (Comment) Care giver support system in place?: Yes (comment) Current home services: Other (comment) ([facility]) Criminal Activity/Legal Involvement Pertinent to Current Situation/Hospitalization: No - Comment as needed  Activities of Daily Living      Permission Sought/Granted Permission sought to share information with : Facility Arts administrator granted to share info w AGENCY: [facility]        Emotional Assessment Appearance:: Appears stated  age Attitude/Demeanor/Rapport: Engaged Affect (typically observed): Accepting, Appropriate Orientation: : Oriented to Self, Oriented to Place, Oriented to  Time, Oriented to Situation Alcohol / Substance Use: Not Applicable Psych Involvement: No (comment)  Admission diagnosis:  Syncope [R55] Syncope, unspecified syncope type [R55] Patient Active Problem List   Diagnosis Date Noted   Syncope 11/18/2021   UTI (urinary tract infection) due to Enterococcus and E coli 11/12/2021   Essential hypertension 11/11/2021   E coli bacteremia 11/10/2021   Hypokalemia 11/09/2021   Normocytic anemia 11/09/2021   Benign neoplasm of transverse colon    Abnormal CT of the abdomen    AKI (acute kidney injury) ([place]) 10/23/2021   Thrombocytopenia ([place]) 10/23/2021   BPH (benign prostatic hyperplasia) 10/23/2021   Hyperbilirubinemia 10/23/2021   Colonic mass 10/23/2021   Aortic atherosclerosis ([place]) 10/23/2021   History of 2019 novel coronavirus disease (COVID-19) 04/24/2019   Dysphonia 10/16/2018   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   Former smoker 08/15/2018   SOB (shortness of breath) 03/23/2018   Vertigo, benign paroxysmal 11/05/2016   Abnormal dreams 09/06/2013   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

## 2021-11-20 NOTE — Progress Notes (Signed)
PT Cancellation Note  Patient Details Name: Theodore King MRN: [id number] DOB: Dec 13, 1945   Cancelled Treatment:    Reason Eval/Treat Not Completed: Patient at procedure or test/unavailable. Per discussion with RN pt undergoing EEG, PT will attempt to follow up once pt is better able to mobilize.   [name] 11/20/2021, 5:27 PM

## 2021-11-20 NOTE — Progress Notes (Signed)
  Progress Note   Patient: Theodore King TKZ:[id number] DOB: [birth year] DOA: 11/18/2021     0 DOS: the patient was seen and examined on 11/20/2021   Brief hospital course: Theodore King is a 76 y.o. male with medical history significant of Parkinson's disease, BPH, HTN.   Presented to the emergency department after a witnessed syncopal episode at his primary care physician's office.  Was passed out for 2 minutes.  No fall. No slurred speech.  No urinary or stool incontinence.  No tonic-clonic or like activity noted.  No tongue lacerations.  Assessment and Plan: Syncope: 2/2 orthostatic hypotension. Was partially orthostatic with OT yesterday.  Discussed with neurology. Will attempt supportive treatment including IV fluids, abdominal binder, compression socks, and slowly getting up. ECHO unremarkable. MRI was negative. On telemetry no arrhythmogenic events noted.   Dysarthria: Stroke alert called  BPH: Continue on Flomax   Hypertension: Continue Norvasc  Parkinson's: Continue home Sinemet. Could possibly be causing his orthostatic hypotension.   Subjective: More dysarthric this morning. No syncopal episodes in the hospital.  Physical Exam: Vitals:   11/20/21 1127 11/20/21 1129 11/20/21 1230 11/20/21 1600  BP: (!) 165/66 (!) 156/75 133/64 (!) 137/107  Pulse: 62 61 61 65  Resp: '20 18 16 16  '$ Temp:   97.6 F (36.4 C)   TempSrc:   Oral   SpO2:   100% 100%  Weight:       No acute distress Regular rate and rhythm Lungs clear to auscultation Abdomen soft nontender Alert and oriented x3.  Dysathria with difficulty with word finding  Data Reviewed: Labs, imaging, vital signs  Family Communication: No family present at bedside  Disposition: Status is: Observation The patient remains OBS appropriate and will d/c before 2 midnights.  [facility] Discharge Destination:  [facility]  Time spent: 55 minutes  Author: Leslee Home, DO 11/20/2021 6:15 PM  For on call review  [email or web address].

## 2021-11-20 NOTE — Progress Notes (Signed)
IV team to bedside for code stroke, pt with 18g in [place] working properly. Sufficient access per stroke response RN.

## 2021-11-20 NOTE — Consult Note (Signed)
Stroke Neurology Consultation Note  Consult Requested by: Dr Rowe Pavy  Reason for Consult: slurry, right leaning  Consult Date: 11/20/21   The history was obtained from the Dr. Rowe Pavy.  During history and examination, all items were able to obtain unless otherwise noted.  History of Present Illness:  Theodore King is a 76 y.o. African American male with PMH of PD, BPH, HLD admitted for syncope. Per Dr. Rowe Pavy, pt was in his PCP office and had syncope episode for 2 min. BP was soft at that time. No fall, no shaking jerking or tongue biting, b/b dysfunction. Had CT and MRI brain no acute finding. BP normalized in ED. He was admitted with IVF and conservative management.   Per dr. Rowe Pavy, he saw pt this morning and found pt more slurry speech than yesterday, some word finding difficulty and right leaning in hospital bed but no unilateral limb weakness. Last seen well unclear. Per RN, she got sign out from night shift did not mentioning of neuro change. This morning pt was a little sleepy, but after code stroke called with more activities, pt seems more awake and did not noticed too much speech change from the baseline.   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)    Paralysis agitans ([place]) 05/18/2013   Parkinson's disease ([place])    Pneumonia due to COVID-19 virus 04/08/2019   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   SOB (shortness of breath) 03/23/2018   Unspecified  vitamin D deficiency    Unspecified vitamin D deficiency    Vertigo, benign paroxysmal 11/05/2016   Vitamin D deficiency 05/18/2013    Past Surgical History:  Procedure Laterality Date   APPENDECTOMY     COLONOSCOPY WITH PROPOFOL N/A 10/26/2021   Procedure: COLONOSCOPY WITH PROPOFOL;  Surgeon: Irene Shipper, MD;  Location: [facility];  Service: Gastroenterology;  Laterality: N/A;   POLYPECTOMY  10/26/2021   Procedure: POLYPECTOMY;  Surgeon: Irene Shipper, MD;  Location: [facility];  Service: Gastroenterology;;   [place]     right    Family History  Problem Relation Age of Onset   Cancer Mother    Cancer Father    Stroke Brother    Cancer Sister    Healthy Daughter    Cancer Sister    Lung cancer Brother    Healthy Daughter    Colon cancer Neg Hx    Esophageal cancer Neg Hx    Rectal cancer Neg Hx    Stomach cancer Neg Hx     Social History:  reports that he quit smoking about 47 years ago. His smoking use included cigarettes. He started smoking about 68 years ago. He has a 10.00 pack-year smoking history. He has never used smokeless tobacco. He reports that he does not currently use alcohol. He reports that he does not use drugs.  Allergies: No Known Allergies  No current facility-administered medications on file prior to encounter.   Current Outpatient Medications on File Prior to  Encounter  Medication Sig Dispense Refill   acetaminophen (TYLENOL) 500 MG tablet Take 500 mg by mouth every 6 (six) hours as needed for mild pain.     albuterol (VENTOLIN HFA) 108 (90 Base) MCG/ACT inhaler Inhale 2 puffs into the lungs every 4 (four) hours as needed for wheezing or shortness of breath.      carbidopa-levodopa (SINEMET CR) 50-200 MG tablet Take 1 tablet by mouth at bedtime. 90 tablet 4   carbidopa-levodopa (SINEMET IR) 25-100 MG tablet Take 2 tablets at 7 AM, 2 tablets at 10 AM, 2 tablets at 2 PM and 1 tablet at 6 PM (Patient taking differently: Take 1-2 tablets by  mouth See admin instructions. Take 2 tablets by mouth at 7 AM, 10 AM, and 2 PM, then 1 tablet at 6 PM) 210 tablet 12   glycopyrrolate (ROBINUL) 1 MG tablet Take 1 tablet (1 mg total) by mouth 2 (two) times daily. 60 tablet 6   sertraline (ZOLOFT) 25 MG tablet Take 1 tablet (25 mg total) by mouth daily. 30 tablet 6   tamsulosin (FLOMAX) 0.4 MG CAPS capsule Take 1 capsule (0.4 mg total) by mouth daily. 30 capsule 1   traZODone (DESYREL) 50 MG tablet Take 50 mg by mouth at bedtime.      Review of Systems: A full ROS was attempted today and was able to be performed.  Systems assessed include - Constitutional, Eyes, HENT, Respiratory, Cardiovascular, Gastrointestinal, Genitourinary, Integument/breast, Hematologic/lymphatic, Musculoskeletal, Neurological, Behavioral/Psych, Endocrine, Allergic/Immunologic - with pertinent responses as per HPI.  Physical Examination: Temp:  [97.6 F (36.4 C)-98.3 F (36.8 C)] 98.3 F (36.8 C) (06/16 0400) Pulse Rate:  [59-65] 65 (06/16 0800) Resp:  [11-19] 18 (06/16 0800) BP: (104-183)/(58-97) 104/58 (06/16 0800) SpO2:  [92 %-100 %] 99 % (06/16 0800) Weight:  [79.2 kg] 79.2 kg (06/16 0500)  General - well nourished, well developed, in no apparent distress.    Ophthalmologic - fundi not visualized due to noncooperation.    Cardiovascular - regular rhythm and rate  Neuro - awake, alert, eyes open, orientated to place, but not to time, stated age 76 instead of 52. No significant aphasia, following all simple commands. Able to name 2/3 and repeat. No gaze palsy, tracking bilaterally, visual field full. No facial droop. Tongue midline. Bilateral UEs 4/5, no drift. Bilaterally LEs 4/5, no drift. Sensation symmetrical bilaterally, b/l FTN intact, gait not tested.   NIH Stroke Scale  Level Of Consciousness 0=Alert; keenly responsive 1=Arouse to minor stimulation 2=Requires repeated stimulation to arouse or movements to pain 3=postures or unresponsive 0  LOC  Questions to Month and Age 14=Answers both questions correctly 1=Answers one question correctly or dysarthria/intubated/trauma/language barrier 2=Answers neither question correctly or aphasia 2  LOC Commands      -Open/Close eyes     -Open/close grip     -Pantomime commands if communication barrier 0=Performs both tasks correctly 1=Performs one task correctly 2=Performs neighter task correctly 0  Best Gaze     -Only assess horizontal gaze 0=Normal 1=Partial gaze palsy 2=Forced deviation, or total gaze paresis 0  Visual 0=No visual loss 1=Partial hemianopia 2=Complete hemianopia 3=Bilateral hemianopia (blind including cortical blindness) 0  Facial Palsy     -Use grimace if obtunded 0=Normal symmetrical movement 1=Minor paralysis (asymmetry) 2=Partial paralysis (lower face) 3=Complete paralysis (upper and lower face) 0  Motor  0=No drift for 10/5 seconds 1=Drift, but does not hit bed 2=Some antigravity effort, hits  bed 3=No effort against gravity, limb falls 4=No movement 0=Amputation/joint  fusion Right Arm 0     Leg 0    Left Arm 0     Leg 0  Limb Ataxia     - FNT/HTS 0=Absent or does not understand or paralyzed or amputation/joint fusion 1=Present in one limb 2=Present in two limbs 0  Sensory 0=Normal 1=Mild to moderate sensory loss 2=Severe to total sensory loss or coma/unresponsive 0  Best Language 0=No aphasia, normal 1=Mild to moderate aphasia 2=Severe aphasia 3=Mute, global aphasia, or coma/unresponsive 1  Dysarthria 0=Normal 1=Mild to moderate 2=Severe, unintelligible or mute/anarthric 0=intubated/unable to test 1  Extinction/Neglect 0=No abnormality 1=visual/tactile/auditory/spatia/personal inattention/Extinction to bilateral simultaneous stimulation 2=Profound neglect/extinction more than 1 modality  0  Total   4     Data Reviewed: ECHOCARDIOGRAM COMPLETE  Result Date: 11/19/2021    ECHOCARDIOGRAM REPORT   Patient Name:   Theodore King Date of  Exam: 11/19/2021 Medical Rec #:  [id number]         Height:       73.0 in Accession #:    [id number]        Weight:       182.5 lb Date of Birth:  06-23-1945         BSA:          2.070 m Patient Age:    46 years          BP:           167/69 mmHg Patient Gender: M                 HR:           65 bpm. Exam Location:  Inpatient Procedure: 2D Echo, 3D Echo, Cardiac Doppler and Color Doppler Indications:    R55 Syncope  History:        Patient has no prior history of Echocardiogram examinations.                 Signs/Symptoms:Dyspnea, Bacteremia and Syncope; Risk                 Factors:Former Smoker and Hypertension.  Sonographer:    Roseanna Rainbow RDCS Referring Phys: [id number] [facility]  Sonographer Comments: Technically difficult study due to poor echo windows, suboptimal parasternal window and suboptimal subcostal window. Low parasternal window. Attempted to turn patient IMPRESSIONS  1. Left ventricular ejection fraction by 3D volume is 62 %. The left ventricle has normal function. The left ventricle has no regional wall motion abnormalities. Left ventricular diastolic parameters are indeterminate.  2. Right ventricular systolic function is normal. The right ventricular size is mildly enlarged.  3. A small pericardial effusion is present. The pericardial effusion is circumferential.  4. The mitral valve is normal in structure. Mild mitral valve regurgitation. No evidence of mitral stenosis.  5. The aortic valve is normal in structure. Aortic valve regurgitation is moderate. No aortic stenosis is present.  6. There is mild dilatation of the aortic root, measuring 39 mm.  7. The inferior vena cava is normal in size with greater than 50% respiratory variability, suggesting right atrial pressure of 3 mmHg. FINDINGS  Left Ventricle: Left ventricular ejection fraction by 3D volume is 62 %. The left ventricle has normal function. The left ventricle has no regional wall motion abnormalities. The left ventricular internal  cavity size was normal in size. There is no left  ventricular hypertrophy. Left ventricular diastolic parameters are indeterminate. Right Ventricle: The right ventricular size is mildly enlarged. No increase in right  ventricular wall thickness. Right ventricular systolic function is normal. Left Atrium: Left atrial size was normal in size. Right Atrium: Right atrial size was normal in size. Pericardium: A small pericardial effusion is present. The pericardial effusion is circumferential. Mitral Valve: The mitral valve is normal in structure. Mild mitral valve regurgitation. No evidence of mitral valve stenosis. MV peak gradient, 6.7 mmHg. The mean mitral valve gradient is 1.0 mmHg. Tricuspid Valve: The tricuspid valve is normal in structure. Tricuspid valve regurgitation is not demonstrated. No evidence of tricuspid stenosis. Aortic Valve: The aortic valve is normal in structure. Aortic valve regurgitation is moderate. Aortic regurgitation PHT measures 506 msec. No aortic stenosis is present. Aortic valve mean gradient measures 5.0 mmHg. Aortic valve peak gradient measures 10.5 mmHg. Aortic valve area, by VTI measures 6.53 cm. Pulmonic Valve: The pulmonic valve was normal in structure. Pulmonic valve regurgitation is not visualized. No evidence of pulmonic stenosis. Aorta: There is mild dilatation of the aortic root, measuring 39 mm. Venous: The inferior vena cava is normal in size with greater than 50% respiratory variability, suggesting right atrial pressure of 3 mmHg. IAS/Shunts: No atrial level shunt detected by color flow Doppler.  LEFT VENTRICLE PLAX 2D LVIDd:         4.30 cm         Diastology LVIDs:         3.10 cm         LV e' medial:    5.44 cm/s LV PW:         1.10 cm         LV E/e' medial:  10.3 LV IVS:        0.90 cm         LV e' lateral:   9.46 cm/s LVOT diam:     3.10 cm         LV E/e' lateral: 5.9 LV SV:         236 LV SV Index:   114 LVOT Area:     7.55 cm        3D Volume EF                                 LV 3D EF:    Left                                             ventricul LV Volumes (MOD)                            ar LV vol d, MOD    129.5 ml                   ejection A2C:                                        fraction LV vol d, MOD    130.0 ml                   by 3D A4C:  volume is LV vol s, MOD    45.8 ml                    62 %. A2C: LV vol s, MOD    44.2 ml A4C:                           3D Volume EF: LV SV MOD A2C:   83.7 ml       3D EF:        62 % LV SV MOD A4C:   130.0 ml      LV EDV:       171 ml LV SV MOD BP:    89.1 ml       LV ESV:       65 ml                                LV SV:        106 ml RIGHT VENTRICLE             IVC RV S prime:     12.80 cm/s  IVC diam: 2.60 cm TAPSE (M-mode): 2.6 cm LEFT ATRIUM             Index        RIGHT ATRIUM           Index LA diam:        4.00 cm 1.93 cm/m   RA Area:     15.60 cm LA Vol (A2C):   57.1 ml 27.59 ml/m  RA Volume:   39.80 ml  19.23 ml/m LA Vol (A4C):   43.1 ml 20.82 ml/m LA Biplane Vol: 50.9 ml 24.59 ml/m  AORTIC VALVE AV Area (Vmax):    6.71 cm AV Area (Vmean):   6.37 cm AV Area (VTI):     6.53 cm AV Vmax:           162.00 cm/s AV Vmean:          107.000 cm/s AV VTI:            0.362 m AV Peak Grad:      10.5 mmHg AV Mean Grad:      5.0 mmHg LVOT Vmax:         144.00 cm/s LVOT Vmean:        90.300 cm/s LVOT VTI:          0.313 m LVOT/AV VTI ratio: 0.86 AI PHT:            506 msec  AORTA Ao Root diam: 3.90 cm MITRAL VALVE MV Area (PHT): 2.48 cm     SHUNTS MV Area VTI:   7.43 cm     Systemic VTI:  0.31 m MV Peak grad:  6.7 mmHg     Systemic Diam: 3.10 cm MV Mean grad:  1.0 mmHg MV Vmax:       1.29 m/s MV Vmean:      53.0 cm/s MV Decel Time: 306 msec MV E velocity: 56.10 cm/s MV A velocity: 118.00 cm/s MV E/A ratio:  0.48 Kardie Tobb DO Electronically signed by Berniece Salines DO Signature Date/Time: 11/19/2021/6:31:33 PM    Final    MR BRAIN WO CONTRAST  Result Date: 11/18/2021 CLINICAL  DATA:  Unresponsive episode lasting approximately 2 minutes with possible slurred speech. EXAM: MRI HEAD WITHOUT CONTRAST TECHNIQUE: Multiplanar,  multiecho pulse sequences of the brain and surrounding structures were obtained without intravenous contrast. COMPARISON:  CT head obtained earlier the same day, MR head 05/04/2021. FINDINGS: Brain: There is no acute intracranial hemorrhage, extra-axial fluid collection, or acute infarct There is mild global parenchymal volume loss with prominence of the ventricular system and extra-axial CSF spaces. The ventricles are stable in size compared to the prior brain MRI from 2022. There is mild FLAIR signal abnormality in the periventricular white matter likely reflecting sequela of mild chronic white matter microangiopathy. There is no suspicious parenchymal signal abnormality. There is no mass lesion. There is no mass effect or midline shift. Vascular: Normal flow voids. Skull and upper cervical spine: Normal marrow signal. Sinuses/Orbits: The paranasal sinuses are clear. Bilateral lens implants are in place. The globes and orbits are otherwise unremarkable. Other: None. IMPRESSION: No acute intracranial pathology. No significant interval change since the brain MRI from 05/04/2021. Electronically Signed   By: Valetta Mole M.D.   On: 11/18/2021 16:35   CT Head Wo Contrast  Result Date: 11/18/2021 CLINICAL DATA:  Syncope EXAM: CT HEAD WITHOUT CONTRAST TECHNIQUE: Contiguous axial images were obtained from the base of the skull through the vertex without intravenous contrast. RADIATION DOSE REDUCTION: This exam was performed according to the departmental dose-optimization program which includes automated exposure control, adjustment of the mA and/or kV according to patient size and/or use of iterative reconstruction technique. COMPARISON:  CT head 08/30/2021 FINDINGS: Brain: No acute intracranial hemorrhage, mass effect, or herniation. No extra-axial fluid collections. No  evidence of acute territorial infarct. No hydrocephalus. Moderate cortical volume loss. Patchy hypodensities in the periventricular and subcortical white matter, likely secondary to chronic microvascular ischemic changes. Vascular: No hyperdense vessel or unexpected calcification. Skull: Normal. Negative for fracture or focal lesion. Sinuses/Orbits: No acute finding. Other: None. IMPRESSION: Chronic changes with no acute intracranial process identified. Electronically Signed   By: Ofilia Neas M.D.   On: 11/18/2021 13:50   DG Chest Port 1 View  Result Date: 11/18/2021 CLINICAL DATA:  Syncope, shortness of breath. EXAM: PORTABLE CHEST 1 VIEW COMPARISON:  11/09/2021 and CT chest 08/22/2020. FINDINGS: Trachea is midline. Heart size stable. Thoracic aorta is calcified. No airspace consolidation or pleural fluid. Chronically elevated left hemidiaphragm. Degenerative changes in the left shoulder. IMPRESSION: No acute findings. Electronically Signed   By: Lorin Picket M.D.   On: 11/18/2021 13:15   DG Chest 2 View  Result Date: 11/09/2021 CLINICAL DATA:  76 year old male with shortness of breath. EXAM: CHEST - 2 VIEW COMPARISON:  05/27/2021 FINDINGS: The mediastinal contours are within normal limits. No cardiomegaly. Similar appearing elevation of the left hemidiaphragm. Mild cephalization of pulmonary vasculature. Hazy bibasilar pulmonary opacities. No pleural effusion or pneumothorax. No acute osseous abnormality. IMPRESSION: Mild pulmonary edema. Electronically Signed   By: Ruthann Cancer M.D.   On: 11/09/2021 09:28   DG Abd 1 View  Result Date: 10/25/2021 CLINICAL DATA:  76 year old male with history of intractable nausea and vomiting. EXAM: ABDOMEN - 1 VIEW COMPARISON:  Abdominal radiograph 10/24/2021. FINDINGS: Nasogastric tube has been removed. Several prominent but nondilated loops of gas-filled small bowel are noted throughout the central abdomen measuring up to 2.6 cm in diameter. Gas and stool  is also noted throughout the colon and distal rectum. No pneumoperitoneum. IMPRESSION: 1. Nonspecific, nonobstructive bowel gas pattern, as above. 2. No pneumoperitoneum. Electronically Signed   By: Vinnie Langton M.D.   On: 10/25/2021 07:46   DG Abd 1 View  Result  Date: 10/24/2021 CLINICAL DATA:  NG tube placement. EXAM: ABDOMEN - 1 VIEW COMPARISON:  Abdominal x-ray 10/24/2021 FINDINGS: Nasogastric tube tip is in the gastric fundus. Dilated small bowel loops are again seen diffusely. Degree of distention has mildly decreased. IMPRESSION: 1. Nasogastric tube tip in the gastric fundus. 2. Dilated small bowel loops again seen, mildly decreased in size. Electronically Signed   By: Ronney Asters M.D.   On: 10/24/2021 23:08   DG Abd 2 Views  Result Date: 10/24/2021 CLINICAL DATA:  Small-bowel obstruction EXAM: ABDOMEN - 2 VIEW COMPARISON:  None Available. FINDINGS: Air-filled mildly dilated loops of small bowel. No free air. No acute osseous abnormality. IMPRESSION: Air-filled, mildly dilated loops of small bowel may reflect distal small bowel obstruction. Electronically Signed   By: Macy Mis M.D.   On: 10/24/2021 12:02   CT ABDOMEN PELVIS WO CONTRAST  Result Date: 10/23/2021 CLINICAL DATA:  Abdominal pain with nausea and diarrhea. EXAM: CT ABDOMEN AND PELVIS WITHOUT CONTRAST TECHNIQUE: Multidetector CT imaging of the abdomen and pelvis was performed following the standard protocol without IV contrast. RADIATION DOSE REDUCTION: This exam was performed according to the departmental dose-optimization program which includes automated exposure control, adjustment of the mA and/or kV according to patient size and/or use of iterative reconstruction technique. COMPARISON:  CT chest abdomen pelvis dated August 22, 2020. FINDINGS: Lower chest: No acute abnormality. Hepatobiliary: No focal liver abnormality is seen. No gallstones, gallbladder wall thickening, or biliary dilatation. Pancreas: Unremarkable. No  pancreatic ductal dilatation or surrounding inflammatory changes. Spleen: Normal in size without focal abnormality. Adrenals/Urinary Tract: Adrenal glands are unremarkable. Kidneys are normal, without renal calculi, focal lesion, or hydronephrosis. Bladder is unremarkable. Stomach/Bowel: The stomach is within normal limits. There are multiple dilated loops of small bowel with gradual transition to non-dilated terminal ileum. The cecum is mildly distended too, and there is questionable mass-like irregular wall thickening of the cecum distal to the ileocecal valve (series 6, image 37). The remaining colon distal to this area is mostly decompressed. History of prior appendectomy. Vascular/Lymphatic: Aortic atherosclerosis. No enlarged abdominal or pelvic lymph nodes. Reproductive: Unchanged prostate median lobe hypertrophy indenting the bladder base. Other: No abdominal wall hernia or abnormality. Trace ascites. No pneumoperitoneum. Musculoskeletal: No acute or significant osseous findings. Mild anasarca. IMPRESSION: 1. Questionable focal mass-like irregular wall thickening of the cecum beyond the ileocecal valve with mild distention of the cecum and diffusely dilated small bowel with gradual transition to non-dilated terminal ileum. 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 Recommend colonoscopy for further evaluation. 2. Aortic Atherosclerosis (ICD10-I70.0). Electronically Signed   By: Titus Dubin M.D.   On: 10/23/2021 14:26    Assessment: 76 y.o. male with PMH of PD, BPH, HLD admitted for syncope and orthostatic hypotension. Had CT and MRI brain no acute finding. BP normalized in ED. He was admitted with IVF and conservative management.  This morning he was found more slurry speech than yesterday, some word finding difficulty and right leaning in hospital bed but no unilateral limb weakness. Last seen well unclear. NIHSS = 4.  CT repeat no acute abnormality.  CTA head and neck no LVO.  CTP  negative.  Patient symptoms improved after CT.  Patient not a TNK candidate given outside window and unclear time of onset.  Not IR candidate given improving symptoms and low NIH score.  Etiology for patient symptoms concerning for mental status change with orthostatic hypotension versus encephalopathy.  Less likely for stroke/TIA without focal deficit.  Does not  feel MRI warranted at that time.  Patient previous MRI negative for stroke.     Orthostatic hypotension is common in Parkinson disease patient, and one of the side effect of Sinemet is orthostatic hypotension.  Recommend close follow-up with patient neurology Dr. [name] in clinic.  Okay to check orthostatic hypotension with PT/OT in a.m.  We will follow  Plan: - Recommend head of bed flat unless worked with PT/OT.  - TED hose for orthostatic hypotension.   - Will check EEG to rule out seizure, but less likely.  - Close follow-up with Dr. [name] in clinic and for PD and orthostatic hypotension - stroke team will follow  Thank you for this consultation and allowing [place] to participate in the care of this patient.  Rosalin Hawking, MD PhD Stroke Neurology 11/20/2021 4:20 PM

## 2021-11-20 NOTE — Code Documentation (Signed)
Stroke Response Nurse Documentation Code Documentation  HAMZA EMPSON is a 76 y.o. male admitted to [facility]  on 11/19/2021 for syncope with past medical hx of HLP, Vitamin D. Code stroke was activated by Admitting Physician .   Patient on 5W unit where he was LKW at yesterday around 1800 when physician rounding and now complaining of worsening dysarthria and word finding difficulties. Physician was rounding this morning when he felt patient was leaning to the right and speech sounded slurred. Called Code Stroke  Stroke team at the bedside after patient activation. Patient to CT with team. NIHSS 4, see documentation for details and code stroke times. Patient with disoriented, Expressive aphasia , and dysarthria  on exam. The following imaging was completed:  CT Head and CTA. Patient is not a candidate for IV Thrombolytic due to outside window. Patient is not a candidate for IR due to no LVO noted per provider.   Care/Plan: q4 NIHSS/VS.   Bedside handoff with RN Lilia Pro.    Kathrin Greathouse  Stroke Response RN

## 2021-11-20 NOTE — Progress Notes (Signed)
EEG complete - results pending 

## 2021-11-20 NOTE — NC FL2 (Addendum)
[place] LEVEL OF CARE SCREENING TOOL     IDENTIFICATION  Patient Name: Theodore King Birthdate: 1946-04-25 Sex: male Admission Date (Current Location): 11/18/2021  [facility] and [place] Number:  Herbalist and Address:  [facility] [place], [place] [address] [place], [place] [address]      Provider Number: [id number]  Attending Physician Name and Address:  Leslee Home, DO  Relative Name and Phone Number:       Current Level of Care: Hospital Recommended Level of Care: [place] Prior Approval Number:    Date Approved/Denied:   PASRR Number:  [id number]  Discharge Plan: SNF    Current Diagnoses: Patient Active Problem List   Diagnosis Date Noted   Syncope 11/18/2021   UTI (urinary tract infection) due to Enterococcus and E coli 11/12/2021   Essential hypertension 11/11/2021   E coli bacteremia 11/10/2021   Hypokalemia 11/09/2021   Normocytic anemia 11/09/2021   Benign neoplasm of transverse colon    Abnormal CT of the abdomen    AKI (acute kidney injury) ([place]) 10/23/2021   Thrombocytopenia ([place]) 10/23/2021   BPH (benign prostatic hyperplasia) 10/23/2021   Hyperbilirubinemia 10/23/2021   Colonic mass 10/23/2021   Aortic atherosclerosis ([place]) 10/23/2021   History of 2019 novel coronavirus disease (COVID-19) 04/24/2019   Dysphonia 10/16/2018   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   Former smoker 08/15/2018   SOB (shortness of breath) 03/23/2018   Vertigo, benign paroxysmal 11/05/2016   Abnormal dreams 09/06/2013   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   Hyperglycemia 05/18/2013   Paralysis agitans ([place]) 05/18/2013   Vitamin D deficiency 05/18/2013   Hyperlipidemia 05/18/2013   Parkinson's disease ([place]) 10/26/2012    Orientation RESPIRATION BLADDER Height & Weight     Self, Time, Place, Situation  Normal Continent Weight: 174 lb 9.7 oz (79.2 kg) Height:     BEHAVIORAL  SYMPTOMS/MOOD NEUROLOGICAL BOWEL NUTRITION STATUS      Continent Diet (See DC Summary.)  AMBULATORY STATUS COMMUNICATION OF NEEDS Skin   Extensive Assist Verbally Normal                       Personal Care Assistance Level of Assistance  Bathing, Feeding, Dressing Bathing Assistance: Maximum assistance Feeding assistance: Limited assistance Dressing Assistance: Maximum assistance     Functional Limitations Info  Sight, Hearing Sight Info: Impaired Hearing Info: Impaired      SPECIAL CARE FACTORS FREQUENCY  PT (By licensed PT), OT (By licensed OT)     PT Frequency: 5x/week OT Frequency: 5x/week            Contractures Contractures Info: Not present    Additional Factors Info  Code Status, Allergies Code Status Info: Full Allergies Info: No known allergies.           Current Medications (11/20/2021):  This is the current hospital active medication list Current Facility-Administered Medications  Medication Dose Route Frequency Provider Last Rate Last Admin   0.9 %  sodium chloride infusion   Intravenous Continuous Imagene Sheller S, DO 50 mL/hr at 11/20/21 0339 Infusion Verify at 11/20/21 0339   albuterol (PROVENTIL) (2.5 MG/3ML) 0.083% nebulizer solution 3 mL  3 mL Inhalation Q4H PRN Imagene Sheller S, DO       amLODipine (NORVASC) tablet 5 mg  5 mg Oral Daily Anwar, Shayan S, DO   5 mg at 11/19/21 1003   carbidopa-levodopa (SINEMET CR) 50-200 MG per  tablet controlled release 1 tablet  1 tablet Oral QHS Imagene Sheller S, DO   1 tablet at 11/19/21 2122   carbidopa-levodopa (SINEMET IR) 25-100 MG per tablet immediate release 1 tablet  1 tablet Oral q1800 Imagene Sheller S, DO   1 tablet at 11/19/21 1735   carbidopa-levodopa (SINEMET IR) 25-100 MG per tablet immediate release 2 tablet  2 tablet Oral 3 times per day Leslee Home, DO       Chlorhexidine Gluconate Cloth 2 % PADS 6 each  6 each Topical Daily Imagene Sheller S, DO   6 each at 11/20/21 0809   enoxaparin  (LOVENOX) injection 40 mg  40 mg Subcutaneous Q24H Imagene Sheller S, DO   40 mg at 11/19/21 1734   glycopyrrolate (ROBINUL) tablet 1 mg  1 mg Oral BID Imagene Sheller S, DO   1 mg at 11/20/21 0807   sertraline (ZOLOFT) tablet 25 mg  25 mg Oral Daily Imagene Sheller S, DO   25 mg at 11/20/21 0807   tamsulosin (FLOMAX) capsule 0.4 mg  0.4 mg Oral Daily Imagene Sheller S, DO   0.4 mg at 11/20/21 5329   traZODone (DESYREL) tablet 50 mg  50 mg Oral QHS Imagene Sheller S, DO   50 mg at 11/19/21 2122     Discharge Medications: Please see discharge summary for a list of discharge medications.  Relevant Imaging Results:  Relevant Lab Results:   Additional Information SSN [id number]; Pfizer vaccines 07/31/19, 08/21/19  Barton Fanny, Student-Social Work

## 2021-11-20 NOTE — Procedures (Signed)
Patient Name: Theodore King  MRN: [id number]  Epilepsy Attending: Lora Havens  Referring Physician/Provider: Rosalin Hawking, MD  Date: 11/20/2021 Duration: 22.47 mins  Patient history: 76 y.o. male with PMH of PD, BPH, HLD admitted for syncope and orthostatic hypotension. EEG to evaluate for seizure.  Level of alertness: Awake  AEDs during EEG study: None  Technical aspects: This EEG study was done with scalp electrodes positioned according to the 10-20 International system of electrode placement. Electrical activity was acquired at a sampling rate of '500Hz'$  and reviewed with a high frequency filter of '70Hz'$  and a low frequency filter of '1Hz'$ . EEG data were recorded continuously and digitally stored.   Description: The posterior dominant rhythm consists of 7.5-8 Hz activity of moderate voltage (25-35 uV) seen predominantly in posterior head regions, symmetric and reactive to eye opening and eye closing.  Hyperventilation and photic stimulation were not performed.     IMPRESSION: This study is within normal limits. No seizures or epileptiform discharges were seen throughout the recording.  [name] Barbra Sarks

## 2021-11-20 NOTE — TOC Progression Note (Signed)
Transition of Care [facility]) - Progression Note    Patient Details  Name: Theodore King MRN: [id number] Date of Birth: [birth year]  Transition of Care [facility]) CM/SW [place], LCSW Phone Number: 11/20/2021, 5:37 PM  Clinical Narrative:    CSW received call from [place]. She reported that after patient's medical updates they are requesting SNF for patient. CSW sent referral to [facility] contracted facilities with the following preference: 1-[facility] (may have beds next week) 2-[facility].     Expected Discharge Plan: [place] Barriers to Discharge: Continued Medical Work up, SNF Pending bed offer  Expected Discharge Plan and Services Expected Discharge Plan: [place] In-house Referral: Clinical Social Work   Post Acute Care Choice:  ([facility]) Living arrangements for the past 2 months: Single Family Home                                       Social Determinants of Health (SDOH) Interventions    Readmission Risk Interventions     No data to display

## 2021-11-20 NOTE — Progress Notes (Deleted)
EEG complete - results pending 

## 2021-11-21 DIAGNOSIS — R55 Syncope and collapse: Secondary | ICD-10-CM | POA: Diagnosis not present

## 2021-11-21 LAB — BASIC METABOLIC PANEL
Anion gap: 9 (ref 5–15)
BUN: 12 mg/dL (ref 8–23)
CO2: 26 mmol/L (ref 22–32)
Calcium: 8.3 mg/dL — ABNORMAL LOW (ref 8.9–10.3)
Chloride: 101 mmol/L (ref 98–111)
Creatinine, Ser: 1.02 mg/dL (ref 0.61–1.24)
GFR, Estimated: >60 mL/min (ref >60)
Glucose, Bld: 109 mg/dL — ABNORMAL HIGH (ref 70–99)
Potassium: 3.9 mmol/L (ref 3.5–5.1)
Sodium: 136 mmol/L (ref 135–145)

## 2021-11-21 LAB — GLUCOSE, CAPILLARY: Glucose-Capillary: 101 mg/dL — ABNORMAL HIGH (ref 70–99)

## 2021-11-21 NOTE — TOC Progression Note (Addendum)
\  Transition of Care [facility]) - Progression Note    Patient Details  Name: [name] MRN: [id number] Date of Birth: 12/30/1945  [facility]) CM/SW Contact  Emeterio Reeve, [place] Phone Number: 11/21/2021, 11:03 AM  Clinical Narrative:     CSW received message from nurse that pts daughter had questions about his discharge.plan. CSW spoke to [name] on phone. [name] stated that [facility] and he resides with her.  CSW explained the SNF process, [name] agreed that SNF will be beneficial. CSW gave [name] the names and rating scale of the 3 SNF's contracted with [facility]. CSW explained that pt has no bed offers and it may be Monday before we have any updated information. [name] thanked CSW for the information. [name] requested to be updated when there are bed offers.   Expected Discharge Plan: [place] Barriers to Discharge: Continued Medical Work up, SNF Pending bed offer  Expected Discharge Plan and Services Expected Discharge Plan: [place] In-house Referral: Clinical Social Work   Post Acute Care Choice:  ([facility]) Living arrangements for the past 2 months: Single Family Home                                       Social Determinants of Health (SDOH) Interventions    Readmission Risk Interventions     No data to display         Emeterio Reeve, LCSW Clinical Social Worker

## 2021-11-21 NOTE — Plan of Care (Signed)
  Problem: Clinical Measurements: Goal: Ability to maintain clinical measurements within normal limits will improve Outcome: Progressing   Problem: Clinical Measurements: Goal: Will remain free from infection Outcome: Progressing   Problem: Clinical Measurements: Goal: Diagnostic test results will improve Outcome: Progressing   Problem: Health Behavior/Discharge Planning: Goal: Ability to manage health-related needs will improve Outcome: Progressing

## 2021-11-21 NOTE — Progress Notes (Addendum)
STROKE TEAM PROGRESS NOTE   INTERVAL HISTORY Patient laying in the bed in NAD. No family at the bedside. Patient c/o "not feeling well" He is awake and alert, following commands. Dysarthric. Able to repeat phrases. Bilateral uppers 5/5, bilateral lowers 5/5, no sensation deficits.  MRI brain is negative for acute infarct. Vitals:   11/20/21 2244 11/20/21 2357 11/21/21 0126 11/21/21 0300  BP: (!) 145/60 (!) 128/55  (!) 181/61  Pulse: 62 (!) 54  (!) 52  Resp: '17 14  18  '$ Temp: 98 F (36.7 C) 97.8 F (36.6 C)  97.8 F (36.6 C)  TempSrc: Oral Axillary  Oral  SpO2: 98% 100%  100%  Weight:   77.9 kg    CBC:  Recent Labs  Lab 11/18/21 1323  WBC 8.0  NEUTROABS 6.8  HGB 10.3*  HCT 32.0*  MCV 84.9  PLT 062   Basic Metabolic Panel:  Recent Labs  Lab 11/18/21 1323 11/21/21 0104  NA 140 136  K 4.1 3.9  CL 103 101  CO2 28 26  GLUCOSE 171* 109*  BUN 13 12  CREATININE 1.14 1.02  CALCIUM 8.6* 8.3*  MG 2.0  --     Urine Drug Screen: No results for input(s): "LABOPIA", "COCAINSCRNUR", "LABBENZ", "AMPHETMU", "THCU", "LABBARB" in the last 168 hours.  Alcohol Level No results for input(s): "ETH" in the last 168 hours.  IMAGING past 24 hours EEG adult  Result Date: 11/20/2021 Lora Havens, MD     11/20/2021  6:14 PM Patient Name: TURRELL SEVERT MRN: [id number] Epilepsy Attending: Lora Havens Referring Physician/Provider: Rosalin Hawking, MD Date: 11/20/2021 Duration: 22.47 mins Patient history: 76 y.o. male with PMH of PD, BPH, HLD admitted for syncope and orthostatic hypotension. EEG to evaluate for seizure. Level of alertness: Awake AEDs during EEG study: None Technical aspects: This EEG study was done with scalp electrodes positioned according to the 10-20 International system of electrode placement. Electrical activity was acquired at a sampling rate of '500Hz'$  and reviewed with a high frequency filter of '70Hz'$  and a low frequency filter of '1Hz'$ . EEG data were recorded continuously  and digitally stored. Description: The posterior dominant rhythm consists of 7.5-8 Hz activity of moderate voltage (25-35 uV) seen predominantly in posterior head regions, symmetric and reactive to eye opening and eye closing.  Hyperventilation and photic stimulation were not performed.   IMPRESSION: This study is within normal limits. No seizures or epileptiform discharges were seen throughout the recording. [place]    PHYSICAL EXAM General: elderly adult male in NAD  Neurological: awake and alert, confused, oriented to person and place. following commands. Tracks. Visual fields full. No facial droop.  Mildly dysarthric. Able to repeat phrases.  [place] Tech is brisk.  Bilateral uppers 5/5, bilateral lowers 5/5, no sensation deficits. FTN intact, deep tendon reflexes are brisk bilaterally.  Gait not tested  ASSESSMENT/PLAN Mr. RAYMUNDO ROUT is a 76 y.o. male with history of  Parkinson's disease, BPH, HLD who presented tot he ED from PCP for hypotension and syncopal episode. No fall, no shaking jerking or tongue biting, b/b dysfunction. Had CT and MRI brain no acute finding. BP normalized in ED. He was admitted with IVF and conservative management.  On 6/16 he was found to have more slurring of speech, word finding difficulties and rightward leaning, subsequent code stroke called   Orthostatic hypotension vs Acute encephalopathy   Code Stroke CT head No acute abnormality. ASPECTS 10.    CTA head & neck  with Perf-no intracranial large vessel occlusion or significant stenosis. 2. Moderate to severe narrowing in the proximal V1 segments bilaterally. Additional mild narrowing in the right V2 segment and at the origin of the right vertebral artery. No other hemodynamically significant stenosis in the neck. 3. No evidence of infarct core or penumbra on CT perfusion study. 2D Echo Ef 62%. No shunt  Check Orthostatic vitals LDL 81 HgbA1c 5.7 VTE prophylaxis - lovenox     Diet   Diet Heart  Room service appropriate? Yes with Assist; Fluid consistency: Thin   No antithrombotic prior to admission, now on No antithrombotic.  Therapy recommendations:  pending  Disposition:  pending   Hypertension Home meds:  none  Stable Permissive hypertension (OK if < 220/120) but gradually normalize in 5-7 days Long-term BP goal normotensive  Hyperlipidemia Home meds:  none,  LDL 81, goal < 70  Continue statin at discharge  Diabetes type II Controlled Home meds:  none HgbA1c 5.7, goal < 7.0 CBGs Recent Labs    11/20/21 0626 11/21/21 0536  GLUCAP 91 101*    SSI  Other Stroke Risk Factors Advanced Age >/= 65  Coronary artery disease  Neurology will sign off. Please call with questions or concerns  Hospital day # 1  Beulah Gandy DNP, ACNPC-Ag   STROKE MD NOTE : I have personally obtained history,examined this patient, reviewed notes, independently viewed imaging studies, participated in medical decision making and plan of care.ROS completed by me personally and pertinent positives fully documented  I have made any additions or clarifications directly to the above note. Agree with note above.  Patient presented with slurred speech in the setting of hypotension and is improved with hydration and blood pressure coming up.  MRI is negative for acute infarct.  Continue optimal hydration and blood pressure management and treatment of Parkinson's disease as per primary neurologist.  Stroke team will sign off.  Kindly call for questions.  Greater than 50% time during the spells and physical spent in counseling and coordination of care and discussion with patient and care team and answering questions.  Antony Contras, MD Medical Director [facility] Pager: [phone] 11/21/2021 3:18 PM  To contact Stroke Continuity provider, please refer to [email or web address] After hours, contact General Neurology

## 2021-11-21 NOTE — Progress Notes (Signed)
  Progress Note   Patient: Theodore King TGG:[id number] DOB: 02-05-1946 DOA: 11/18/2021     1 DOS: the patient was seen and examined on 11/21/2021   Brief hospital course: Theodore King is a 76 y.o. male with medical history significant of Parkinson's disease, BPH, HTN.   Presented to the emergency department after a witnessed syncopal episode at his primary care physician's office.  Was passed out for 2 minutes.  No fall. No slurred speech.  No urinary or stool incontinence.  No tonic-clonic or like activity noted.  No tongue lacerations.  Assessment and Plan: Syncope: 2/2 orthostatic hypotension. Discussed with neurology. Supportive treatment including IV fluids, abdominal binder, compression socks, and slowly getting up. ECHO unremarkable. MRI was negative. On telemetry no arrhythmogenic events noted.   Dysarthria: Stroke alert called yesterday after noted dysarthria at bedside. Workup was negative.   BPH: Continue on Flomax   Hypertension: Continue Norvasc  Parkinson's: Continue home Sinemet. Could possibly be causing his orthostatic hypotension.   Subjective: At baseline. No acute complaints.  Physical Exam: Vitals:   11/21/21 0126 11/21/21 0300 11/21/21 1200 11/21/21 1600  BP:  (!) 181/61 (!) 174/47   Pulse:  (!) 52 (!) 49   Resp:  '18 16 16  '$ Temp:  97.8 F (36.6 C)    TempSrc:  Oral    SpO2:  100%    Weight: 77.9 kg      No acute distress Regular rate and rhythm Lungs clear to auscultation Abdomen soft nontender Alert and oriented x2 to name and place.  Data Reviewed: Labs, imaging, vital signs  Family Communication: No family present at bedside  Disposition: Status is: inpatient Awaiting SNF bed  [facility]  Time spent: 55 minutes  Author: Leslee Home, DO 11/21/2021 5:03 PM  For on call review [email or web address].

## 2021-11-22 DIAGNOSIS — R55 Syncope and collapse: Secondary | ICD-10-CM | POA: Diagnosis not present

## 2021-11-22 LAB — GLUCOSE, CAPILLARY
Glucose-Capillary: 106 mg/dL — ABNORMAL HIGH (ref 70–99)
Glucose-Capillary: 153 mg/dL — ABNORMAL HIGH (ref 70–99)

## 2021-11-22 NOTE — Plan of Care (Signed)
  Problem: Clinical Measurements: Goal: Will remain free from infection Outcome: Progressing   Problem: Clinical Measurements: Goal: Diagnostic test results will improve Outcome: Progressing   Problem: Nutrition: Goal: Adequate nutrition will be maintained Outcome: Progressing   Problem: Pain Managment: Goal: General experience of comfort will improve Outcome: Progressing   

## 2021-11-22 NOTE — Progress Notes (Signed)
  Progress Note   Patient: Theodore King KGY:[id number] DOB: 1946/01/19 DOA: 11/18/2021     2 DOS: the patient was seen and examined on 11/22/2021   Brief hospital course: Theodore King is a 76 y.o. male with medical history significant of Parkinson's disease, BPH, HTN.   Presented to the emergency department after a witnessed syncopal episode at his primary care physician's office.  Was passed out for 2 minutes.  No fall. No slurred speech.  No urinary or stool incontinence.  No tonic-clonic or like activity noted.  No tongue lacerations.  Assessment and Plan: Syncope: 2/2 orthostatic hypotension. Discussed with neurology. Supportive treatment including IV fluids, abdominal binder, compression socks, and slowly getting up. ECHO unremarkable. MRI was negative. On telemetry no arrhythmogenic events noted.   Dysarthria: Stroke alert called on 6/16 after noted dysarthria at bedside. Workup was negative.   BPH: Continue on Flomax   Hypertension: Continue Norvasc  Parkinson's: Continue home Sinemet. Could possibly be causing his orthostatic hypotension.   Subjective: At baseline. No acute complaints.  Physical Exam: Vitals:   11/21/21 2327 11/22/21 0316 11/22/21 0452 11/22/21 0800  BP: (!) 113/44 (!) 119/47  (!) 110/48  Pulse: 63 (!) 53  (!) 57  Resp: '20 20  14  '$ Temp: 97.8 F (36.6 C) 97.8 F (36.6 C)  97.8 F (36.6 C)  TempSrc: Oral Oral  Axillary  SpO2: 98% 98%    Weight:   78.8 kg    No acute distress Regular rate and rhythm Lungs clear to auscultation Abdomen soft nontender Alert and oriented x2 to name and place.  Data Reviewed: Labs, imaging, vital signs  Family Communication: No family present at bedside  Disposition: Status is: inpatient Awaiting SNF bed  [facility]  Time spent: 55 minutes  Author: Leslee Home, DO 11/22/2021 4:25 PM  For on call review [email or web address].

## 2021-11-23 DIAGNOSIS — R55 Syncope and collapse: Secondary | ICD-10-CM | POA: Diagnosis not present

## 2021-11-23 LAB — CBC
HCT: 30.2 % — ABNORMAL LOW (ref 39.0–52.0)
Hemoglobin: 9.6 g/dL — ABNORMAL LOW (ref 13.0–17.0)
MCH: 27 pg (ref 26.0–34.0)
MCHC: 31.8 g/dL (ref 30.0–36.0)
MCV: 84.8 fL (ref 80.0–100.0)
Platelets: 193 10*3/uL (ref 150–400)
RBC: 3.56 MIL/uL — ABNORMAL LOW (ref 4.22–5.81)
RDW: 14.6 % (ref 11.5–15.5)
WBC: 5.2 10*3/uL (ref 4.0–10.5)
nRBC: 0 % (ref 0.0–0.2)

## 2021-11-23 LAB — BASIC METABOLIC PANEL
Anion gap: 8 (ref 5–15)
BUN: 15 mg/dL (ref 8–23)
CO2: 28 mmol/L (ref 22–32)
Calcium: 8.5 mg/dL — ABNORMAL LOW (ref 8.9–10.3)
Chloride: 102 mmol/L (ref 98–111)
Creatinine, Ser: 1 mg/dL (ref 0.61–1.24)
GFR, Estimated: >60 mL/min (ref >60)
Glucose, Bld: 132 mg/dL — ABNORMAL HIGH (ref 70–99)
Potassium: 3.9 mmol/L (ref 3.5–5.1)
Sodium: 138 mmol/L (ref 135–145)

## 2021-11-23 LAB — GLUCOSE, CAPILLARY: Glucose-Capillary: 122 mg/dL — ABNORMAL HIGH (ref 70–99)

## 2021-11-23 NOTE — Plan of Care (Signed)
°  Problem: Clinical Measurements: °Goal: Will remain free from infection °Outcome: Progressing °  °Problem: Activity: °Goal: Risk for activity intolerance will decrease °Outcome: Progressing °  °Problem: Nutrition: °Goal: Adequate nutrition will be maintained °Outcome: Progressing °  °

## 2021-11-23 NOTE — TOC Progression Note (Addendum)
Transition of Care [facility]) - Progression Note    Patient Details  Name: Theodore King MRN: [id number] Date of Birth: 1946-02-06  Transition of Care [facility]) CM/SW [place], LCSW Phone Number: 11/23/2021, 9:04 AM  Clinical Narrative:    9am-CSW awaiting response from [place] on bed availability.   10am-[facility] does not have beds available.  11am-[facility] trying to get in touch with [facility] regarding [facility] needed but [facility] is closed today.    Expected Discharge Plan: [place] Barriers to Discharge: Continued Medical Work up, SNF Pending bed offer  Expected Discharge Plan and Services Expected Discharge Plan: [place] In-house Referral: Clinical Social Work   Post Acute Care Choice:  ([facility]) Living arrangements for the past 2 months: Single Family Home                                       Social Determinants of Health (SDOH) Interventions    Readmission Risk Interventions     No data to display

## 2021-11-23 NOTE — Progress Notes (Signed)
Physical Therapy Treatment Patient Details Name: Theodore King MRN: [id number] DOB: 10-06-1945 Today's Date: 11/23/2021   History of Present Illness Pt adm 6/14 after witnessed syncopal episode at MD's office. MRI neg. PMH - Parkinsons, BPH    PT Comments    Patient progressing well towards PT goals. Session focused on gait training and balance. Pt requires mod A for standing due to weakness and retropulsion Tolerated gait training with Min-Mod A and use of RW for support with cues to increase step lengths. Pt with complete LOB when turning needing Mod A to regain balance and prevent fall. Pt interested in going to rehab to improve function/strength. Noted to have positive orthostatic hypotension but able to recover BP and resume activity without symptoms. See below. Will continue to follow and progress as tolerated.  Supine BP 134/55 HR 59 bpm Sitting BP 98/57, HR 67 bpm Standing BP 110/83 HR 70s bpm    Recommendations for follow up therapy are one component of a multi-disciplinary discharge planning process, led by the attending physician.  Recommendations may be updated based on patient status, additional functional criteria and insurance authorization.  Follow Up Recommendations  Skilled nursing-short term rehab (<3 hours/day)     Assistance Recommended at Discharge Frequent or constant Supervision/Assistance  Patient can return home with the following A lot of help with walking and/or transfers;Assist for transportation;Help with stairs or ramp for entrance;A little help with bathing/dressing/bathroom;Assistance with cooking/housework   Equipment Recommendations  None recommended by PT    Recommendations for Other Services       Precautions / Restrictions Precautions Precautions: Fall;Other (comment) Precaution Comments: watch BP, orthostatic today 11/23/21 Restrictions Weight Bearing Restrictions: No     Mobility  Bed Mobility Overal bed mobility: Needs  Assistance Bed Mobility: Supine to Sit     Supine to sit: Min assist, HOB elevated     General bed mobility comments: Assist to elevate trunk to get to EOB with increased time. No dizziness.    Transfers Overall transfer level: Needs assistance Equipment used: Rolling walker (2 wheels) Transfers: Sit to/from Stand Sit to Stand: Mod assist           General transfer comment: Mod A to power to standing wtih cues for hand placement/technique. Stood from [employer], from chair x1, transferred to chair post ambulation. Retropulsion upon standing.    Ambulation/Gait Ambulation/Gait assistance: Mod assist Gait Distance (Feet): 110 Feet Assistive device: Rolling walker (2 wheels) Gait Pattern/deviations: Step-through pattern, Decreased step length - right, Decreased step length - left, Shuffle, Trunk flexed Gait velocity: decr     General Gait Details: Slow, unsteady gait with short shuffling steps initially, able to increase step lengths and responding well to verbal cues to "take bigger steps." Some freezing noted and complete LOB when turning needing Mod A to prevent fall. 1 standing rest break. Able to self correct direction of RW.   Stairs             Wheelchair Mobility    Modified Rankin (Stroke Patients Only)       Balance Overall balance assessment: Needs assistance Sitting-balance support: Feet supported, No upper extremity supported Sitting balance-Leahy Scale: Fair     Standing balance support: During functional activity Standing balance-Leahy Scale: Poor Standing balance comment: Requires Ue support and external support in standing Min-mod A pending static vs dynamic standing.  Cognition Arousal/Alertness: Awake/alert Behavior During Therapy: WFL for tasks assessed/performed Overall Cognitive Status: No family/caregiver present to determine baseline cognitive functioning Area of Impairment: Problem solving                              Problem Solving: Slow processing General Comments: Pt slow to initiate response however likely baseline.        Exercises      General Comments General comments (skin integrity, edema, etc.): Supine BP 134/55 HR 59 bpm, sitting BP 98/57, HR 67 bpm, standing BP 110/83 HR 70s      Pertinent Vitals/Pain Pain Assessment Pain Assessment: No/denies pain    Home Living                          Prior Function            PT Goals (current goals can now be found in the care plan section) Progress towards PT goals: Progressing toward goals    Frequency    Min 3X/week      PT Plan Current plan remains appropriate    Co-evaluation              AM-PAC PT "6 Clicks" Mobility   Outcome Measure  Help needed turning from your back to your side while in a flat bed without using bedrails?: A Little Help needed moving from lying on your back to sitting on the side of a flat bed without using bedrails?: A Lot Help needed moving to and from a bed to a chair (including a wheelchair)?: A Lot Help needed standing up from a chair using your arms (e.g., wheelchair or bedside chair)?: A Lot Help needed to walk in hospital room?: A Lot Help needed climbing 3-5 steps with a railing? : Total 6 Click Score: 12    End of Session Equipment Utilized During Treatment: Gait belt Activity Tolerance: Patient tolerated treatment well;Treatment limited secondary to medical complications (Comment) (bp drop) Patient left: in chair;with call bell/phone within reach;with chair alarm set Nurse Communication: Mobility status PT Visit Diagnosis: Unsteadiness on feet (R26.81);Other abnormalities of gait and mobility (R26.89);Other symptoms and signs involving the nervous system (R29.898)     Time: 9833-8250 PT Time Calculation (min) (ACUTE ONLY): 24 min  Charges:  $Gait Training: 23-37 mins                     Marisa Severin, PT, DPT Acute Rehabilitation  Services Secure chat preferred Office [place] 11/23/2021, 4:02 PM

## 2021-11-23 NOTE — Progress Notes (Signed)
  Progress Note   Patient: Theodore King:[id number] DOB: June 13, 1945 DOA: 11/18/2021     3 DOS: the patient was seen and examined on 11/23/2021   Brief hospital course: ANDDY WINGERT is a 76 y.o. male with medical history significant of Parkinson's disease, BPH, HTN.   Presented to the emergency department after a witnessed syncopal episode at his primary care physician's office.  Was passed out for 2 minutes.  No fall. No slurred speech.  No urinary or stool incontinence.  No tonic-clonic or like activity noted.  No tongue lacerations.  Assessment and Plan: Syncope: 2/2 orthostatic hypotension. Discussed with neurology. Supportive treatment including IV fluids, abdominal binder, compression socks, and slowly getting up. ECHO unremarkable. MRI was negative. On telemetry no arrhythmogenic events noted. Patient's parkinson's the likely driver of this. Some home meds including flomax, sinemet likely also contribute.   Dysarthria: Stroke alert called on 6/16 after noted dysarthria at bedside. Workup was negative.   BPH: Continue on Flomax. Has chronic indwelling foley  Hypertension: Continue Norvasc  Parkinson's: Continue home Sinemet.    Subjective: At baseline. No acute complaints.tolerating diet  Physical Exam: Vitals:   11/23/21 0400 11/23/21 0433 11/23/21 0806 11/23/21 1205  BP: (!) 129/50  (!) 118/54 (!) 138/94  Pulse: 60 (!) 56 64 64  Resp: 18  18   Temp: 98.1 F (36.7 C)  97.6 F (36.4 C) 97.9 F (36.6 C)  TempSrc: Oral  Oral Oral  SpO2: 99% 100% 97% 96%  Weight:  79.7 kg     No acute distress Regular rate and rhythm Lungs clear to auscultation Abdomen soft nontender Alert and oriented x2 to name and place.  Data Reviewed: Labs, imaging, vital signs  Family Communication: No family present at bedside. No answer when daughter called today  Disposition: Status is: inpatient Awaiting SNF bed  Planned Discharge Destination:  snf  Time spent: 35  minutes  Author: Desma Maxim, MD 11/23/2021 1:55 PM  For on call review [email or web address].

## 2021-11-23 NOTE — Care Management Important Message (Signed)
Important Message  Patient Details  Name: MACALLAN ORD MRN: [id number] Date of Birth: 10-21-1945   Medicare Important Message Given:  Yes     Memory Argue 11/23/2021, 2:57 PM

## 2021-11-24 DIAGNOSIS — Z978 Presence of other specified devices: Secondary | ICD-10-CM

## 2021-11-24 DIAGNOSIS — R55 Syncope and collapse: Secondary | ICD-10-CM | POA: Diagnosis not present

## 2021-11-24 DIAGNOSIS — G20 Parkinson's disease: Secondary | ICD-10-CM | POA: Diagnosis not present

## 2021-11-24 LAB — GLUCOSE, CAPILLARY
Glucose-Capillary: 103 mg/dL — ABNORMAL HIGH (ref 70–99)
Glucose-Capillary: 149 mg/dL — ABNORMAL HIGH (ref 70–99)

## 2021-11-24 NOTE — Progress Notes (Signed)
1502: call placed to [facility], no answer. Will attempt again

## 2021-11-24 NOTE — Plan of Care (Signed)
  Problem: Clinical Measurements: Goal: Diagnostic test results will improve Outcome: Progressing   Problem: Clinical Measurements: Goal: Will remain free from infection Outcome: Progressing   Problem: Nutrition: Goal: Adequate nutrition will be maintained Outcome: Progressing

## 2021-11-24 NOTE — Discharge Summary (Signed)
Physician Discharge Summary   Theodore King QQP:[id number] DOB: [birth year] DOA: 11/18/2021  PCP: [name], MD  Admit date: 11/18/2021 Discharge date: 11/24/2021  Admitted From: Home Disposition:  SNF Discharging physician: Dwyane Dee, MD  Recommendations for Outpatient Follow-up:  Consider adding midodrine if any further orthostasis  Home Health:  Equipment/Devices:   Discharge Condition: stable CODE STATUS: Full Diet recommendation:  Diet Orders (From admission, onward)     Start     Ordered   11/24/21 0000  Diet - low sodium heart healthy        11/24/21 1234   11/20/21 0722  Diet Heart Room service appropriate? Yes with Assist; Fluid consistency: Thin  Diet effective now       Question Answer Comment  Room service appropriate? Yes with Assist   Fluid consistency: Thin      11/20/21 0722            Hospital Course:  MCCORMICK MACON is a 76 y.o. male with medical history significant of Parkinson's disease, BPH, HTN.   Presented to the emergency department after a witnessed syncopal episode at his primary care physician's office.  Was passed out for 2 minutes.  No fall. No slurred speech.  No urinary or stool incontinence.  No tonic-clonic or like activity noted.  No tongue lacerations.   Assessment and Plan: Syncope: 2/2 orthostatic hypotension. Discussed with neurology. Supportive treatment including IV fluids, abdominal binder, compression socks, and slowly getting up. ECHO unremarkable. MRI was negative. On telemetry no arrhythmogenic events noted. Patient's parkinson's the likely driver of this. Some home meds including flomax, sinemet likely also contribute.    Dysarthria: Stroke alert called on 6/16 after noted dysarthria at bedside. Workup was negative.    BPH: Continue on Flomax. Has chronic indwelling foley   Hypertension: d/c amlodipine in setting of orthostatic hypotension   Parkinson's: Continue home Sinemet.    Principal Diagnosis:  Syncope  Discharge Diagnoses: Principal Problem:   Syncope   Discharge Instructions     Diet - low sodium heart healthy   Complete by: As directed    Increase activity slowly   Complete by: As directed       Allergies as of 11/24/2021   No Known Allergies      Medication List     STOP taking these medications    amoxicillin 500 MG capsule Commonly known as: AMOXIL       TAKE these medications    acetaminophen 500 MG tablet Commonly known as: TYLENOL Take 500 mg by mouth every 6 (six) hours as needed for mild pain.   albuterol 108 (90 Base) MCG/ACT inhaler Commonly known as: VENTOLIN HFA Inhale 2 puffs into the lungs every 4 (four) hours as needed for wheezing or shortness of breath.   carbidopa-levodopa 50-200 MG tablet Commonly known as: SINEMET CR Take 1 tablet by mouth at bedtime. What changed: Another medication with the same name was changed. Make sure you understand how and when to take each.   carbidopa-levodopa 25-100 MG tablet Commonly known as: SINEMET IR Take 2 tablets at 7 AM, 2 tablets at 10 AM, 2 tablets at 2 PM and 1 tablet at 6 PM What changed:  how much to take how to take this when to take this additional instructions   glycopyrrolate 1 MG tablet Commonly known as: Robinul Take 1 tablet (1 mg total) by mouth 2 (two) times daily.   sertraline 25 MG tablet Commonly known as: ZOLOFT Take 1 tablet (  25 mg total) by mouth daily.   tamsulosin 0.4 MG Caps capsule Commonly known as: FLOMAX Take 1 capsule (0.4 mg total) by mouth daily.   traZODone 50 MG tablet Commonly known as: DESYREL Take 50 mg by mouth at bedtime.        No Known Allergies  Consultations: Neurology  Procedures:   Discharge Exam: BP (!) 105/54 (BP Location: Left Arm)   Pulse (!) 59   Temp 98 F (36.7 C) (Oral)   Resp 16   Wt 78.2 kg   SpO2 99%   BMI 22.75 kg/m  Physical Exam Constitutional:      Comments: Pleasant and chronically ill-appearing  elderly gentleman sitting in chair in no distress  HENT:     Head: Normocephalic and atraumatic.     Mouth/Throat:     Mouth: Mucous membranes are moist.  Eyes:     Extraocular Movements: Extraocular movements intact.  Cardiovascular:     Rate and Rhythm: Normal rate and regular rhythm.     Heart sounds: Normal heart sounds.  Pulmonary:     Effort: Pulmonary effort is normal. No respiratory distress.     Breath sounds: Normal breath sounds. No wheezing.  Abdominal:     General: Bowel sounds are normal. There is no distension.     Palpations: Abdomen is soft.     Tenderness: There is no abdominal tenderness.  Genitourinary:    Comments: Indwelling Foley in place Musculoskeletal:        General: Normal range of motion.     Cervical back: Normal range of motion and neck supple.  Skin:    General: Skin is warm and dry.  Neurological:     Mental Status: Mental status is at baseline.  Psychiatric:        Mood and Affect: Mood normal.        Behavior: Behavior normal.      The results of significant diagnostics from this hospitalization (including imaging, microbiology, ancillary and laboratory) are listed below for reference.   Microbiology: No results found for this or any previous visit (from the past 240 hour(s)).   Labs: BNP (last 3 results) Recent Labs    05/27/21 2326  BNP 34.1   Basic Metabolic Panel: Recent Labs  Lab 11/18/21 1323 11/21/21 0104 11/23/21 0144  NA 140 136 138  K 4.1 3.9 3.9  CL 103 101 102  CO2 '28 26 28  '$ GLUCOSE 171* 109* 132*  BUN '13 12 15  '$ CREATININE 1.14 1.02 1.00  CALCIUM 8.6* 8.3* 8.5*  MG 2.0  --   --    Liver Function Tests: Recent Labs  Lab 11/18/21 1323  AST 19  ALT <5  ALKPHOS 64  BILITOT 1.0  PROT 6.0*  ALBUMIN 2.5*   No results for input(s): "LIPASE", "AMYLASE" in the last 168 hours. No results for input(s): "AMMONIA" in the last 168 hours. CBC: Recent Labs  Lab 11/18/21 1323 11/23/21 0144  WBC 8.0 5.2   NEUTROABS 6.8  --   HGB 10.3* 9.6*  HCT 32.0* 30.2*  MCV 84.9 84.8  PLT 238 193   Cardiac Enzymes: No results for input(s): "CKTOTAL", "CKMB", "CKMBINDEX", "TROPONINI" in the last 168 hours. BNP: Invalid input(s): "POCBNP" CBG: Recent Labs  Lab 11/22/21 0508 11/22/21 0844 11/23/21 0547 11/24/21 0530 11/24/21 0742  GLUCAP 106* 153* 122* 103* 149*   D-Dimer No results for input(s): "DDIMER" in the last 72 hours. Hgb A1c No results for input(s): "HGBA1C" in the last 72  hours. Lipid Profile No results for input(s): "CHOL", "HDL", "LDLCALC", "TRIG", "CHOLHDL", "LDLDIRECT" in the last 72 hours. Thyroid function studies No results for input(s): "TSH", "T4TOTAL", "T3FREE", "THYROIDAB" in the last 72 hours.  Invalid input(s): "FREET3" Anemia work up No results for input(s): "VITAMINB12", "FOLATE", "FERRITIN", "TIBC", "IRON", "RETICCTPCT" in the last 72 hours. Urinalysis    Component Value Date/Time   COLORURINE YELLOW 11/09/2021 1154   APPEARANCEUR HAZY (A) 11/09/2021 1154   LABSPEC 1.009 11/09/2021 1154   PHURINE 6.0 11/09/2021 1154   GLUCOSEU NEGATIVE 11/09/2021 1154   HGBUR LARGE (A) 11/09/2021 1154   BILIRUBINUR NEGATIVE 11/09/2021 1154   KETONESUR 5 (A) 11/09/2021 1154   PROTEINUR 30 (A) 11/09/2021 1154   UROBILINOGEN 1.0 04/16/2020 2048   NITRITE POSITIVE (A) 11/09/2021 1154   LEUKOCYTESUR MODERATE (A) 11/09/2021 1154   Sepsis Labs Recent Labs  Lab 11/18/21 1323 11/23/21 0144  WBC 8.0 5.2   Microbiology No results found for this or any previous visit (from the past 240 hour(s)).  Procedures/Studies: EEG adult  Result Date: 12/19/2021 Lora Havens, MD     12-19-21  6:14 PM Patient Name: Theodore King MRN: [id number] Epilepsy Attending: Lora Havens Referring Physician/Provider: Rosalin Hawking, MD Date: 2021-12-19 Duration: 22.47 mins Patient history: 76 y.o. male with PMH of PD, BPH, HLD admitted for syncope and orthostatic hypotension. EEG to  evaluate for seizure. Level of alertness: Awake AEDs during EEG study: None Technical aspects: This EEG study was done with scalp electrodes positioned according to the 10-20 International system of electrode placement. Electrical activity was acquired at a sampling rate of '500Hz'$  and reviewed with a high frequency filter of '70Hz'$  and a low frequency filter of '1Hz'$ . EEG data were recorded continuously and digitally stored. Description: The posterior dominant rhythm consists of 7.5-8 Hz activity of moderate voltage (25-35 uV) seen predominantly in posterior head regions, symmetric and reactive to eye opening and eye closing.  Hyperventilation and photic stimulation were not performed.   IMPRESSION: This study is within normal limits. No seizures or epileptiform discharges were seen throughout the recording. Priyanka Barbra Sarks   CT ANGIO HEAD NECK W WO CM W PERF (CODE STROKE)  Result Date: Dec 19, 2021 CLINICAL DATA:  Stroke code EXAM: CT ANGIOGRAPHY HEAD AND NECK CT PERFUSION BRAIN TECHNIQUE: Multidetector CT imaging of the head and neck was performed using the standard protocol during bolus administration of intravenous contrast. Multiplanar CT image reconstructions and MIPs were obtained to evaluate the vascular anatomy. Carotid stenosis measurements (when applicable) are obtained utilizing NASCET criteria, using the distal internal carotid diameter as the denominator. Multiphase CT imaging of the brain was performed following IV bolus contrast injection. Subsequent parametric perfusion maps were calculated using RAPID software. RADIATION DOSE REDUCTION: This exam was performed according to the departmental dose-optimization program which includes automated exposure control, adjustment of the mA and/or kV according to patient size and/or use of iterative reconstruction technique. CONTRAST:  172m OMNIPAQUE IOHEXOL 350 MG/ML SOLN COMPARISON:  CT head 11/18/2021 and 007/15/23FINDINGS: CT HEAD FINDINGS For noncontrast  findings, please see same day CT head. CTA NECK FINDINGS Aortic arch: Two-vessel arch with a common origin of the brachiocephalic and left common carotid arteries. Imaged portion shows no evidence of aneurysm or dissection. No significant stenosis of the major arch vessel origins. Aortic atherosclerosis. Right carotid system: No evidence of dissection, occlusion, or hemodynamically significant stenosis (greater than 50%). Atherosclerotic disease in the CCA, at the bifurcation, and in the proximal ICA is not  hemodynamically significant. Left carotid system: No evidence of dissection, occlusion, or hemodynamically significant stenosis (greater than 50%).Atherosclerotic disease in the CCA, at the bifurcation, and in the proximal ICA is not hemodynamically significant. Vertebral arteries: Moderate to severe atherosclerotic narrowing in the left V1 segment. The left vertebral artery is otherwise patent and without significant stenosis. The right vertebral artery origin is mildly narrowed due to atherosclerotic plaque. Moderate to severe narrowing in the proximal right V1. Mild narrowing in the right V2 segment (series 10, image 127). The right vertebral artery is otherwise patent without significant stenosis. No evidence of dissection or occlusion. Skeleton: No acute osseous abnormality. Degenerative changes in the cervical spine. Poor dentition with periapical lucencies about the right maxillary dental roots and multifocal dental caries. Other neck: Multiple hypoenhancing nodules in the thyroid, the largest of which measures up to 1.2 cm, for which no follow-up is indicated. (Reference: J Am Coll Radiol. 2015 Feb;12(2): 143-50) Upper chest: No focal pulmonary opacity or pleural effusion. Review of the MIP images confirms the above findings CTA HEAD FINDINGS Anterior circulation: Both internal carotid arteries are patent to the termini, with mild calcifications but without significant stenosis. A1 segments patent. Normal  anterior communicating artery. Anterior cerebral arteries are patent to their distal aspects. No M1 stenosis or occlusion. Normal MCA bifurcations. Distal MCA branches are irregular but perfused. Posterior circulation: Vertebral arteries patent to the vertebrobasilar junction without stenosis. Posterior inferior cerebellar arteries patent proximally. Basilar patent to its distal aspect. Superior cerebellar arteries patent proximally. Patent P1 segments. PCAs somewhat irregular but perfused to their distal aspects without focal stenosis. The bilateral posterior communicating arteries are not visualized. Venous sinuses: As permitted by contrast timing, patent. Anatomic variants: None significant. Review of the MIP images confirms the above findings CT Brain Perfusion Findings: ASPECTS: 10 CBF (<30%) Volume: 11m Perfusion (Tmax>6.0s) volume: 036mMismatch Volume: 68m53mnfarction Location:None. IMPRESSION: 1.  No intracranial large vessel occlusion or significant stenosis. 2. Moderate to severe narrowing in the proximal V1 segments bilaterally. Additional mild narrowing in the right V2 segment and at the origin of the right vertebral artery. No other hemodynamically significant stenosis in the neck. 3. No evidence of infarct core or penumbra on CT perfusion study. Electronically Signed   By: AliMerilyn BabaD.   On: 11/20/2021 11:37   CT HEAD CODE STROKE WO CONTRAST  Result Date: 11/20/2021 CLINICAL DATA:  Code stroke. EXAM: CT HEAD WITHOUT CONTRAST TECHNIQUE: Contiguous axial images were obtained from the base of the skull through the vertex without intravenous contrast. RADIATION DOSE REDUCTION: This exam was performed according to the departmental dose-optimization program which includes automated exposure control, adjustment of the mA and/or kV according to patient size and/or use of iterative reconstruction technique. COMPARISON:  11/18/2021 FINDINGS: CT head images are included with the CTA. Brain: No evidence  of acute infarction, hemorrhage, cerebral edema, mass, mass effect, or midline shift. No hydrocephalus or extra-axial collection. Vascular: No hyperdense vessel. Atherosclerotic calcifications in the intracranial carotid and vertebral arteries. Noted either Skull: Negative for fracture or focal lesion. Sinuses/Orbits: No acute finding. Status post bilateral lens replacements. Other: The mastoid air cells are well aerated. ASPECTS ([facility] Early CT Score) - Ganglionic level infarction (caudate, lentiform nuclei, internal capsule, insula, M1-M3 cortex): 7 - Supraganglionic infarction (M4-M6 cortex): 3 Total score (0-10 with 10 being normal): 10 IMPRESSION: 1. No acute intracranial process. 2. ASPECTS is 10 Code stroke imaging results were communicated on 11/20/2021 at 11:22 am to provider Dr.  Xu via secure text paging. Electronically Signed   By: Merilyn Baba M.D.   On: 11/20/2021 11:23   ECHOCARDIOGRAM COMPLETE  Result Date: 11/19/2021    ECHOCARDIOGRAM REPORT   Patient Name:   Theodore King Date of Exam: 11/19/2021 Medical Rec #:  [id number]         Height:       73.0 in Accession #:    [id number]        Weight:       182.5 lb Date of Birth:  1945-08-05         BSA:          2.070 m Patient Age:    38 years          BP:           167/69 mmHg Patient Gender: M                 HR:           65 bpm. Exam Location:  Inpatient Procedure: 2D Echo, 3D Echo, Cardiac Doppler and Color Doppler Indications:    R55 Syncope  History:        Patient has no prior history of Echocardiogram examinations.                 Signs/Symptoms:Dyspnea, Bacteremia and Syncope; Risk                 Factors:Former Smoker and Hypertension.  Sonographer:    Roseanna Rainbow RDCS Referring Phys: [id number] [facility]  Sonographer Comments: Technically difficult study due to poor echo windows, suboptimal parasternal window and suboptimal subcostal window. Low parasternal window. Attempted to turn patient IMPRESSIONS  1. Left  ventricular ejection fraction by 3D volume is 62 %. The left ventricle has normal function. The left ventricle has no regional wall motion abnormalities. Left ventricular diastolic parameters are indeterminate.  2. Right ventricular systolic function is normal. The right ventricular size is mildly enlarged.  3. A small pericardial effusion is present. The pericardial effusion is circumferential.  4. The mitral valve is normal in structure. Mild mitral valve regurgitation. No evidence of mitral stenosis.  5. The aortic valve is normal in structure. Aortic valve regurgitation is moderate. No aortic stenosis is present.  6. There is mild dilatation of the aortic root, measuring 39 mm.  7. The inferior vena cava is normal in size with greater than 50% respiratory variability, suggesting right atrial pressure of 3 mmHg. FINDINGS  Left Ventricle: Left ventricular ejection fraction by 3D volume is 62 %. The left ventricle has normal function. The left ventricle has no regional wall motion abnormalities. The left ventricular internal cavity size was normal in size. There is no left  ventricular hypertrophy. Left ventricular diastolic parameters are indeterminate. Right Ventricle: The right ventricular size is mildly enlarged. No increase in right ventricular wall thickness. Right ventricular systolic function is normal. Left Atrium: Left atrial size was normal in size. Right Atrium: Right atrial size was normal in size. Pericardium: A small pericardial effusion is present. The pericardial effusion is circumferential. Mitral Valve: The mitral valve is normal in structure. Mild mitral valve regurgitation. No evidence of mitral valve stenosis. MV peak gradient, 6.7 mmHg. The mean mitral valve gradient is 1.0 mmHg. Tricuspid Valve: The tricuspid valve is normal in structure. Tricuspid valve regurgitation is not demonstrated. No evidence of tricuspid stenosis. Aortic Valve: The aortic valve is normal in structure. Aortic valve  regurgitation is moderate. Aortic  regurgitation PHT measures 506 msec. No aortic stenosis is present. Aortic valve mean gradient measures 5.0 mmHg. Aortic valve peak gradient measures 10.5 mmHg. Aortic valve area, by VTI measures 6.53 cm. Pulmonic Valve: The pulmonic valve was normal in structure. Pulmonic valve regurgitation is not visualized. No evidence of pulmonic stenosis. Aorta: There is mild dilatation of the aortic root, measuring 39 mm. Venous: The inferior vena cava is normal in size with greater than 50% respiratory variability, suggesting right atrial pressure of 3 mmHg. IAS/Shunts: No atrial level shunt detected by color flow Doppler.  LEFT VENTRICLE PLAX 2D LVIDd:         4.30 cm         Diastology LVIDs:         3.10 cm         LV e' medial:    5.44 cm/s LV PW:         1.10 cm         LV E/e' medial:  10.3 LV IVS:        0.90 cm         LV e' lateral:   9.46 cm/s LVOT diam:     3.10 cm         LV E/e' lateral: 5.9 LV SV:         236 LV SV Index:   114 LVOT Area:     7.55 cm        3D Volume EF                                LV 3D EF:    Left                                             ventricul LV Volumes (MOD)                            ar LV vol d, MOD    129.5 ml                   ejection A2C:                                        fraction LV vol d, MOD    130.0 ml                   by 3D A4C:                                        volume is LV vol s, MOD    45.8 ml                    62 %. A2C: LV vol s, MOD    44.2 ml A4C:                           3D Volume EF: LV SV MOD A2C:   83.7 ml       3D EF:  62 % LV SV MOD A4C:   130.0 ml      LV EDV:       171 ml LV SV MOD BP:    89.1 ml       LV ESV:       65 ml                                LV SV:        106 ml RIGHT VENTRICLE             IVC RV S prime:     12.80 cm/s  IVC diam: 2.60 cm TAPSE (M-mode): 2.6 cm LEFT ATRIUM             Index        RIGHT ATRIUM           Index LA diam:        4.00 cm 1.93 cm/m   RA Area:     15.60 cm LA Vol  (A2C):   57.1 ml 27.59 ml/m  RA Volume:   39.80 ml  19.23 ml/m LA Vol (A4C):   43.1 ml 20.82 ml/m LA Biplane Vol: 50.9 ml 24.59 ml/m  AORTIC VALVE AV Area (Vmax):    6.71 cm AV Area (Vmean):   6.37 cm AV Area (VTI):     6.53 cm AV Vmax:           162.00 cm/s AV Vmean:          107.000 cm/s AV VTI:            0.362 m AV Peak Grad:      10.5 mmHg AV Mean Grad:      5.0 mmHg LVOT Vmax:         144.00 cm/s LVOT Vmean:        90.300 cm/s LVOT VTI:          0.313 m LVOT/AV VTI ratio: 0.86 AI PHT:            506 msec  AORTA Ao Root diam: 3.90 cm MITRAL VALVE MV Area (PHT): 2.48 cm     SHUNTS MV Area VTI:   7.43 cm     Systemic VTI:  0.31 m MV Peak grad:  6.7 mmHg     Systemic Diam: 3.10 cm MV Mean grad:  1.0 mmHg MV Vmax:       1.29 m/s MV Vmean:      53.0 cm/s MV Decel Time: 306 msec MV E velocity: 56.10 cm/s MV A velocity: 118.00 cm/s MV E/A ratio:  0.48 Kardie Tobb DO Electronically signed by Berniece Salines DO Signature Date/Time: 11/19/2021/6:31:33 PM    Final    MR BRAIN WO CONTRAST  Result Date: 11/18/2021 CLINICAL DATA:  Unresponsive episode lasting approximately 2 minutes with possible slurred speech. EXAM: MRI HEAD WITHOUT CONTRAST TECHNIQUE: Multiplanar, multiecho pulse sequences of the brain and surrounding structures were obtained without intravenous contrast. COMPARISON:  CT head obtained earlier the same day, MR head 05/04/2021. FINDINGS: Brain: There is no acute intracranial hemorrhage, extra-axial fluid collection, or acute infarct There is mild global parenchymal volume loss with prominence of the ventricular system and extra-axial CSF spaces. The ventricles are stable in size compared to the prior brain MRI from 2022. There is mild FLAIR signal abnormality in the periventricular white matter likely reflecting sequela of mild chronic white matter microangiopathy. There is no suspicious  parenchymal signal abnormality. There is no mass lesion. There is no mass effect or midline shift. Vascular:  Normal flow voids. Skull and upper cervical spine: Normal marrow signal. Sinuses/Orbits: The paranasal sinuses are clear. Bilateral lens implants are in place. The globes and orbits are otherwise unremarkable. Other: None. IMPRESSION: No acute intracranial pathology. No significant interval change since the brain MRI from 05/04/2021. Electronically Signed   By: Valetta Mole M.D.   On: 11/18/2021 16:35   CT Head Wo Contrast  Result Date: 11/18/2021 CLINICAL DATA:  Syncope EXAM: CT HEAD WITHOUT CONTRAST TECHNIQUE: Contiguous axial images were obtained from the base of the skull through the vertex without intravenous contrast. RADIATION DOSE REDUCTION: This exam was performed according to the departmental dose-optimization program which includes automated exposure control, adjustment of the mA and/or kV according to patient size and/or use of iterative reconstruction technique. COMPARISON:  CT head 08/30/2021 FINDINGS: Brain: No acute intracranial hemorrhage, mass effect, or herniation. No extra-axial fluid collections. No evidence of acute territorial infarct. No hydrocephalus. Moderate cortical volume loss. Patchy hypodensities in the periventricular and subcortical white matter, likely secondary to chronic microvascular ischemic changes. Vascular: No hyperdense vessel or unexpected calcification. Skull: Normal. Negative for fracture or focal lesion. Sinuses/Orbits: No acute finding. Other: None. IMPRESSION: Chronic changes with no acute intracranial process identified. Electronically Signed   By: Ofilia Neas M.D.   On: 11/18/2021 13:50   DG Chest Port 1 View  Result Date: 11/18/2021 CLINICAL DATA:  Syncope, shortness of breath. EXAM: PORTABLE CHEST 1 VIEW COMPARISON:  11/09/2021 and CT chest 08/22/2020. FINDINGS: Trachea is midline. Heart size stable. Thoracic aorta is calcified. No airspace consolidation or pleural fluid. Chronically elevated left hemidiaphragm. Degenerative changes in the left  shoulder. IMPRESSION: No acute findings. Electronically Signed   By: Lorin Picket M.D.   On: 11/18/2021 13:15   DG Chest 2 View  Result Date: 11/09/2021 CLINICAL DATA:  76 year old male with shortness of breath. EXAM: CHEST - 2 VIEW COMPARISON:  05/27/2021 FINDINGS: The mediastinal contours are within normal limits. No cardiomegaly. Similar appearing elevation of the left hemidiaphragm. Mild cephalization of pulmonary vasculature. Hazy bibasilar pulmonary opacities. No pleural effusion or pneumothorax. No acute osseous abnormality. IMPRESSION: Mild pulmonary edema. Electronically Signed   By: Ruthann Cancer M.D.   On: 11/09/2021 09:28     Time coordinating discharge: Over 30 minutes    Dwyane Dee, MD  [facility] 11/24/2021, 12:34 PM

## 2021-11-24 NOTE — Progress Notes (Signed)
Occupational Therapy Treatment Patient Details Name: Theodore King MRN: [id number] DOB: September 05, 1945 Today's Date: 11/24/2021   History of present illness Pt adm 6/14 after witnessed syncopal episode at MD's office. MRI neg. PMH - Parkinsons, BPH   OT comments  Patient received in supine and agreeable to OT session. Patient was min assist to get to EOB with assistance for scooting towards EOB. Patient was mod assist to stand from EOB and to transfer to recliner. Patient was provided setup for grooming and bathing seated in recliner with cueing for sequencing.  UB dressing performed with gown change. Patient became more lethargic at end of session and placed in reclining position in recliner. Acute OT to continue to follow.    Recommendations for follow up therapy are one component of a multi-disciplinary discharge planning process, led by the attending physician.  Recommendations may be updated based on patient status, additional functional criteria and insurance authorization.    Follow Up Recommendations  Skilled nursing-short term rehab (<3 hours/day)    Assistance Recommended at Discharge Frequent or constant Supervision/Assistance  Patient can return home with the following  A lot of help with walking and/or transfers;A lot of help with bathing/dressing/bathroom;Assistance with cooking/housework;Direct supervision/assist for medications management;Assist for transportation   Equipment Recommendations  BSC/3in1    Recommendations for Other Services      Precautions / Restrictions Precautions Precautions: Fall;Other (comment) Precaution Comments: watch BP Restrictions Weight Bearing Restrictions: No       Mobility Bed Mobility Overal bed mobility: Needs Assistance Bed Mobility: Supine to Sit     Supine to sit: Min assist, HOB elevated     General bed mobility comments: cues to use rail and assistance scooting hips forward    Transfers Overall transfer level: Needs  assistance Equipment used: Rolling walker (2 wheels) Transfers: Sit to/from Stand Sit to Stand: Mod assist     Step pivot transfers: Mod assist     General transfer comment: cues for safety and hand placement. Mod assist to power up and to stedy     Balance Overall balance assessment: Needs assistance Sitting-balance support: Feet supported, No upper extremity supported Sitting balance-Leahy Scale: Fair     Standing balance support: During functional activity Standing balance-Leahy Scale: Poor Standing balance comment: reliant on RW for support                           ADL either performed or assessed with clinical judgement   ADL Overall ADL's : Needs assistance/impaired     Grooming: Wash/dry hands;Wash/dry face;Oral care;Sitting;Supervision/safety;Cueing for sequencing Grooming Details (indicate cue type and reason): seated in recliner Upper Body Bathing: Minimal assistance;Sitting Upper Body Bathing Details (indicate cue type and reason): in recliner Lower Body Bathing: Moderate assistance;Sitting/lateral leans;Sit to/from stand Lower Body Bathing Details (indicate cue type and reason): able to bathe legs while seated Upper Body Dressing : Minimal assistance;Sitting Upper Body Dressing Details (indicate cue type and reason): top change gown                   General ADL Comments: performed self care tasks seated in recliner with cues for sequencing    Extremity/Trunk Assessment              Vision       Perception     Praxis      Cognition Arousal/Alertness: Awake/alert Behavior During Therapy: WFL for tasks assessed/performed Overall Cognitive Status: No family/caregiver present to determine baseline  cognitive functioning Area of Impairment: Problem solving                             Problem Solving: Slow processing General Comments: required cues for sequencing with self care        Exercises      Shoulder  Instructions       General Comments BP in supine 11/51, seated in recliner following self care 97/52, feet elevated and reclined in recliner 103/52    Pertinent Vitals/ Pain       Pain Assessment Pain Assessment: No/denies pain  Home Living                                          Prior Functioning/Environment              Frequency  Min 2X/week        Progress Toward Goals  OT Goals(current goals can now be found in the care plan section)  Progress towards OT goals: Progressing toward goals  Acute Rehab OT Goals Patient Stated Goal: get better OT Goal Formulation: With patient Time For Goal Achievement: 12/03/21 Potential to Achieve Goals: Good ADL Goals Pt Will Transfer to Toilet: with min assist;stand pivot transfer Additional ADL Goal #1: Pt will increase to modA overall for ADL tasks. Additional ADL Goal #2: pt will stand x3 mins with fair balance for OOB ADL.  Plan Discharge plan remains appropriate    Co-evaluation                 AM-PAC OT "6 Clicks" Daily Activity     Outcome Measure   Help from another person eating meals?: A Little Help from another person taking care of personal grooming?: A Little Help from another person toileting, which includes using toliet, bedpan, or urinal?: Total Help from another person bathing (including washing, rinsing, drying)?: A Lot Help from another person to put on and taking off regular upper body clothing?: A Little Help from another person to put on and taking off regular lower body clothing?: A Lot 6 Click Score: 14    End of Session Equipment Utilized During Treatment: Gait belt;Rolling walker (2 wheels)  OT Visit Diagnosis: Unsteadiness on feet (R26.81);Muscle weakness (generalized) (M62.81)   Activity Tolerance Patient tolerated treatment well   Patient Left in chair;with call bell/phone within reach;with chair alarm set   Nurse Communication Mobility status;Other (comment)  (BP readings)        Time: 8101-7510 OT Time Calculation (min): 29 min  Charges: OT General Charges $OT Visit: 1 Visit OT Treatments $Self Care/Home Management : 23-37 mins  Lodema Hong, Darke  Office [phone]   Trixie Dredge 11/24/2021, 9:38 AM

## 2021-11-24 NOTE — TOC Transition Note (Signed)
Transition of Care [facility]) - CM/SW Discharge Note   Patient Details  Name: Theodore King MRN: [id number] Date of Birth: [birth year]  [facility] CM/SW Contact:  Benard Halsted, LCSW Phone Number: 11/24/2021, 12:40 PM   Clinical Narrative:    Patient will DC to: [facility] Anticipated DC date: 11/24/21 Family notified: Daughter, [name] Transport by: [facility] after 2pm (they will bring wc)   Per MD patient ready for DC to [facility]. RN to call report prior to discharge [phone]). RN, patient, patient's family, and facility notified of DC. Discharge Summary and FL2 sent to facility.   CSW will sign off for now as social work intervention is no longer needed. Please consult [place] again if new needs arise.     Final next level of care: Skilled Nursing Facility Barriers to Discharge: Barriers Resolved   Patient Goals and CMS Choice Patient states their goals for this hospitalization and ongoing recovery are:: Return home CMS Medicare.gov Compare Post Acute Care list provided to:: Patient Choice offered to / list presented to : Patient  Discharge Placement   Existing PASRR number confirmed : 11/24/21          Patient chooses bed at: [facility] Patient to be transferred to facility by: [facility] Name of family member notified: Daughter Patient and family notified of of transfer: 11/24/21  Discharge Plan and Services In-house Referral: Clinical Social Work   Post Acute Care Choice:  ([facility])                               Social Determinants of Health (SDOH) Interventions     Readmission Risk Interventions     No data to display

## 2021-11-24 NOTE — TOC Progression Note (Signed)
Transition of Care [facility]) - Progression Note    Patient Details  Name: Theodore King MRN: [id number] Date of Birth: 30-Jun-1945  Transition of Care [facility]) CM/SW Amada [place], LCSW Phone Number: 11/24/2021, 10:12 AM  Clinical Narrative:    CSW spoke with Olivia Mackie at [facility] and she stated she has approval for patient to go to [facility] for two weeks of rehab. CSW will call her back once discharge is complete and [facility] will pick patient up. [name] aware and can accept patient today. Patient's wheelchair should be in the room already. CSW updated patient's daughter, [name], who is in agreement and thankful for plan.    Expected Discharge Plan: Skilled Nursing Facility Barriers to Discharge: Barriers Resolved  Expected Discharge Plan and Services Expected Discharge Plan: [place] In-house Referral: Clinical Social Work   Post Acute Care Choice:  ([facility]) Living arrangements for the past 2 months: Single Family Home                                       Social Determinants of Health (SDOH) Interventions    Readmission Risk Interventions     No data to display

## 2022-01-30 ENCOUNTER — Emergency Department (HOSPITAL_COMMUNITY)
Admission: EM | Admit: 2022-01-30 | Discharge: 2022-01-30 | Disposition: A | Discharge status: Home / Self Care | Payer: Medicare (Managed Care) | Source: Home / Self Care | Attending: Emergency Medicine | Admitting: Emergency Medicine

## 2022-01-30 ENCOUNTER — Other Ambulatory Visit: Payer: Self-pay

## 2022-01-30 DIAGNOSIS — N390 Urinary tract infection, site not specified: Secondary | ICD-10-CM | POA: Insufficient documentation

## 2022-01-30 DIAGNOSIS — F0390 Unspecified dementia without behavioral disturbance: Secondary | ICD-10-CM | POA: Insufficient documentation

## 2022-01-30 DIAGNOSIS — G20 Parkinson's disease: Secondary | ICD-10-CM | POA: Insufficient documentation

## 2022-01-30 DIAGNOSIS — R911 Solitary pulmonary nodule: Secondary | ICD-10-CM | POA: Diagnosis not present

## 2022-01-30 DIAGNOSIS — T83511A Infection and inflammatory reaction due to indwelling urethral catheter, initial encounter: Secondary | ICD-10-CM | POA: Diagnosis not present

## 2022-01-30 LAB — CBC WITH DIFFERENTIAL/PLATELET
Abs Immature Granulocytes: 0.04 10*3/uL (ref 0.00–0.07)
Basophils Absolute: 0 10*3/uL (ref 0.0–0.1)
Basophils Relative: 0 %
Eosinophils Absolute: 0 10*3/uL (ref 0.0–0.5)
Eosinophils Relative: 0 %
HCT: 29.6 % — ABNORMAL LOW (ref 39.0–52.0)
Hemoglobin: 9.5 g/dL — ABNORMAL LOW (ref 13.0–17.0)
Immature Granulocytes: 0 %
Lymphocytes Relative: 12 %
Lymphs Abs: 1 10*3/uL (ref 0.7–4.0)
MCH: 26.5 pg (ref 26.0–34.0)
MCHC: 32.1 g/dL (ref 30.0–36.0)
MCV: 82.7 fL (ref 80.0–100.0)
Monocytes Absolute: 0.3 10*3/uL (ref 0.1–1.0)
Monocytes Relative: 4 %
Neutro Abs: 7.6 10*3/uL (ref 1.7–7.7)
Neutrophils Relative %: 84 %
Platelets: 198 10*3/uL (ref 150–400)
RBC: 3.58 MIL/uL — ABNORMAL LOW (ref 4.22–5.81)
RDW: 14.9 % (ref 11.5–15.5)
WBC: 9 10*3/uL (ref 4.0–10.5)
nRBC: 0 % (ref 0.0–0.2)

## 2022-01-30 LAB — URINALYSIS, ROUTINE W REFLEX MICROSCOPIC
Bilirubin Urine: NEGATIVE
Glucose, UA: NEGATIVE mg/dL
Ketones, ur: NEGATIVE mg/dL
Nitrite: NEGATIVE
Protein, ur: 100 mg/dL — AB
RBC / HPF: >50 RBC/hpf — ABNORMAL HIGH (ref 0–5)
Specific Gravity, Urine: 1.013 (ref 1.005–1.030)
WBC, UA: >50 WBC/hpf — ABNORMAL HIGH (ref 0–5)
pH: 6 (ref 5.0–8.0)

## 2022-01-30 LAB — COMPREHENSIVE METABOLIC PANEL
ALT: 19 U/L (ref 0–44)
AST: 28 U/L (ref 15–41)
Albumin: 3 g/dL — ABNORMAL LOW (ref 3.5–5.0)
Alkaline Phosphatase: 70 U/L (ref 38–126)
Anion gap: 7 (ref 5–15)
BUN: 28 mg/dL — ABNORMAL HIGH (ref 8–23)
CO2: 25 mmol/L (ref 22–32)
Calcium: 8.9 mg/dL (ref 8.9–10.3)
Chloride: 108 mmol/L (ref 98–111)
Creatinine, Ser: 1.25 mg/dL — ABNORMAL HIGH (ref 0.61–1.24)
GFR, Estimated: 59 mL/min — ABNORMAL LOW (ref >60)
Glucose, Bld: 127 mg/dL — ABNORMAL HIGH (ref 70–99)
Potassium: 4 mmol/L (ref 3.5–5.1)
Sodium: 140 mmol/L (ref 135–145)
Total Bilirubin: 0.6 mg/dL (ref 0.3–1.2)
Total Protein: 7.9 g/dL (ref 6.5–8.1)

## 2022-01-30 LAB — LIPASE, BLOOD: Lipase: 51 U/L (ref 11–51)

## 2022-01-30 MED ORDER — SODIUM CHLORIDE 0.9 % IV BOLUS
1000.0000 mL | Freq: Once | INTRAVENOUS | Status: AC
Start: 2022-01-30 — End: 2022-01-30
  Administered 2022-01-30: 1000 mL via INTRAVENOUS

## 2022-01-30 MED ORDER — CEPHALEXIN 500 MG PO CAPS
500.0000 mg | ORAL_CAPSULE | Freq: Once | ORAL | Status: AC
  Administered 2022-01-30: 500 mg via ORAL
  Filled 2022-01-30: qty 1

## 2022-01-30 MED ORDER — CEPHALEXIN 500 MG PO CAPS: 500.0000 mg | ORAL_CAPSULE | Freq: Four times a day (QID) | ORAL | 0 refills | Status: DC

## 2022-01-30 NOTE — ED Provider Notes (Signed)
[facility] DEPT Provider Note   CSN: [id number] Arrival date & time: 01/30/22  5784     History  Chief Complaint  Patient presents with   Shaking   Abdominal Pain    Theodore King is a 76 y.o. male.  Patient is a 76 year old male with a past medical history of dementia, Parkinson's and BPH with chronic Foley catheter in place presenting to the emergency department with abdominal pain.  The patient states that he started to have lower abdominal pain since last night.  He states that he was unable to sleep due to the pain.  He states that he did have nausea and vomiting overnight but that his nausea has now resolved.  He denies any diarrhea or constipation, fevers or chills.  He states that he is having increasing pain with urination.  He denies any testicular pain.  I spoke to the patient's daughter who he lives with for additional corroborating information:  Woke daughter up at 5am complaining of abd pain Daughter and son take care of him Complaining of shaking all night  More delirious than usual  He does mess with his catheter daily  Hx of urinary retention from BPH Had normal BM last evening   The history is provided by the patient and a relative.  Abdominal Pain      Home Medications Prior to Admission medications   Medication Sig Start Date End Date Taking? Authorizing Provider  albuterol (VENTOLIN HFA) 108 (90 Base) MCG/ACT inhaler Inhale 2 puffs into the lungs every 4 (four) hours as needed for wheezing or shortness of breath.  10/03/18  Yes [provider]  carbidopa-levodopa (SINEMET CR) 50-200 MG tablet Take 1 tablet by mouth at bedtime. 09/24/21  Yes Penumalli, Earlean Polka, MD  carbidopa-levodopa (SINEMET IR) 25-100 MG tablet Take 2 tablets at 7 AM, 2 tablets at 10 AM, 2 tablets at 2 PM and 1 tablet at 6 PM Patient taking differently: Take 1-2 tablets by mouth See admin instructions. Take 2 tablets by mouth every morning and  afternoon, take 1 tablet at bedtime 09/24/21  Yes Penumalli, Earlean Polka, MD  cephALEXin (KEFLEX) 500 MG capsule Take 1 capsule (500 mg total) by mouth 4 (four) times daily for 10 days. 01/30/22 02/09/22 Yes Makayela Secrest, Norman Herrlich, DO  Cholecalciferol (VITAMIN D3 ULTRA POTENCY) 1.25 MG (50000 UT) TABS Take 50,000 Units by mouth once a week.   Yes [provider]  fludrocortisone (FLORINEF) 0.1 MG tablet Take 0.1 mg by mouth daily.   Yes [provider]  glycopyrrolate (ROBINUL) 1 MG tablet Take 1 tablet (1 mg total) by mouth 2 (two) times daily. 09/24/21  Yes Penumalli, Earlean Polka, MD  sertraline (ZOLOFT) 25 MG tablet Take 1 tablet (25 mg total) by mouth daily. 09/24/21  Yes Penumalli, Earlean Polka, MD  traZODone (DESYREL) 50 MG tablet Take 50 mg by mouth at bedtime. 08/17/20  Yes [provider]  acetaminophen (TYLENOL) 500 MG tablet Take 500 mg by mouth every 6 (six) hours as needed for mild pain.    [provider]  tamsulosin (FLOMAX) 0.4 MG CAPS capsule Take 1 capsule (0.4 mg total) by mouth daily. 04/16/20   Vanessa Kick, MD      Allergies    Patient has no known allergies.    Review of Systems   Review of Systems  Gastrointestinal:  Positive for abdominal pain.    Physical Exam Updated Vital Signs BP (!) 178/79   Pulse 81  Temp 97.8 F (36.6 C) (Oral)   Resp 20   Ht '6\' 1"'$  (1.854 m)   Wt 78.2 kg   SpO2 100%   BMI 22.75 kg/m  Physical Exam Vitals and nursing note reviewed.  Constitutional:      General: He is not in acute distress.    Appearance: He is well-developed.  HENT:     Head: Normocephalic and atraumatic.  Eyes:     Extraocular Movements: Extraocular movements intact.  Cardiovascular:     Rate and Rhythm: Normal rate and regular rhythm.     Heart sounds: Normal heart sounds.  Pulmonary:     Effort: Pulmonary effort is normal.     Breath sounds: Normal breath sounds.  Abdominal:     General: Abdomen is flat.     Palpations: Abdomen is  soft.     Tenderness: There is no abdominal tenderness.  Genitourinary:    Comments: Foley catheter in place draining yellow urine Skin:    General: Skin is warm and dry.  Neurological:     General: No focal deficit present.     Mental Status: He is alert.     Comments: Oriented to person and place  Psychiatric:        Mood and Affect: Mood normal.        Behavior: Behavior normal.     ED Results / Procedures / Treatments   Labs (all labs ordered are listed, but only abnormal results are displayed) Labs Reviewed  URINALYSIS, ROUTINE W REFLEX MICROSCOPIC - Abnormal; Notable for the following components:      Result Value   Color, Urine AMBER (*)    APPearance CLOUDY (*)    Hgb urine dipstick LARGE (*)    Protein, ur 100 (*)    Leukocytes,Ua LARGE (*)    RBC / HPF >50 (*)    WBC, UA >50 (*)    Bacteria, UA FEW (*)    All other components within normal limits  CBC WITH DIFFERENTIAL/PLATELET - Abnormal; Notable for the following components:   RBC 3.58 (*)    Hemoglobin 9.5 (*)    HCT 29.6 (*)    All other components within normal limits  COMPREHENSIVE METABOLIC PANEL - Abnormal; Notable for the following components:   Glucose, Bld 127 (*)    BUN 28 (*)    Creatinine, Ser 1.25 (*)    Albumin 3.0 (*)    GFR, Estimated 59 (*)    All other components within normal limits  URINE CULTURE  LIPASE, BLOOD    EKG None  Radiology No results found.  Procedures Procedures    Medications Ordered in ED Medications  sodium chloride 0.9 % bolus 1,000 mL (0 mLs Intravenous Stopped 01/30/22 1129)  cephALEXin (KEFLEX) capsule 500 mg (500 mg Oral Given 01/30/22 1129)    ED Course/ Medical Decision Making/ A&P Clinical Course as of 01/30/22 1134  Sat Jan 30, 2022  0918 Labs reviewed and interpreted by myself show mild AKI with creatinine of 1.2 from baseline of 1.0.  He will be fluid repleted.  Remainder of his labs within will range.  Urine is pending at this time. [VK]  4332  Patient's urine with few bacteria and large leukocytes, in the setting of his abdominal pain and dysuria is concerning for UTI.  Previous UTIs have been sensitive to Keflex and he will be started on antibiotics.  He is otherwise stable for discharge home with primary care follow-up [VK]    Clinical Course  User Index [VK] Ottie Glazier, DO                           Medical Decision Making This patient presents to the ED with chief complaint(s) of abdominal pain with pertinent past medical history of BPH with chronic indwelling Foley and recurrent UTI, dementia, Parkinson's which further complicates the presenting complaint. The complaint involves an extensive differential diagnosis and also carries with it a high risk of complications and morbidity.    The differential diagnosis includes concern for possible UTI, possible penile or urethral injury from pulling at Foley, low concern for diverticulosis or diverticulitis or other intra-abdominal infection as the patient has no point tenderness and no fevers possible pancreatitis or hepatitis in the setting of his nausea and vomiting  Additional history obtained: Additional history obtained from family Records reviewed previous lab and micro records  ED Course and Reassessment: Upon reassessment, the patient's labs have no significant abnormalities.  Urine is positive for bacteria and leukocytes with in the setting of his lower abdominal pain and dysuria is concerning for UTI.  He will be treated with Keflex.  Urine culture was sent.  He is stable for discharge home with primary care follow-up.   Consultation: - Consulted or discussed management/test interpretation w/ external professional: N/A  Consideration for admission or further workup: Patient has no emergent conditions that require admission today.  He is stable for discharge home with primary care follow-up and was given strict return precautions. Social Determinants of health:  N/A    Amount and/or Complexity of Data Reviewed Labs: ordered.  Risk Prescription drug management.           Final Clinical Impression(s) / ED Diagnoses Final diagnoses:  Lower urinary tract infectious disease    Rx / DC Orders ED Discharge Orders          Ordered    cephALEXin (KEFLEX) 500 MG capsule  4 times daily        01/30/22 1130              [place], [place] K, DO 01/30/22 1134

## 2022-01-30 NOTE — ED Triage Notes (Addendum)
BIBA from home for abd pain , shaking (Has Parkinson) and Dementia    CBG 144

## 2022-01-30 NOTE — ED Notes (Signed)
Spoke to pt daughter regarding pt dc, stated pt son is on the way.

## 2022-01-30 NOTE — ED Notes (Signed)
Call placed to daughter for transport, stated she will find someone who can come pick up pt.

## 2022-01-30 NOTE — ED Notes (Signed)
Call placed to daughter Younes Degeorge for transport.

## 2022-01-30 NOTE — ED Notes (Addendum)
Call placed to sister Makale Pindell for transport.

## 2022-01-30 NOTE — Discharge Instructions (Signed)
You were seen in the emergency department for your abdominal pain.  Your urine does show that you have a urinary tract infection.  We did send a urine culture due to you having a Foley catheter in place and if this grows a different bacteria and you need to change her antibiotic, you will receive a call.  You should otherwise complete the antibiotic prescribed.  You should follow-up with your primary doctor in the next few days to have your symptoms rechecked.  You should return to the emergency department if you are having worsening abdominal pain, fevers, worsening confusion, repetitive vomiting, or if you have any other new or concerning symptoms.

## 2022-01-31 ENCOUNTER — Inpatient Hospital Stay (HOSPITAL_COMMUNITY)
Admission: EM | Admit: 2022-01-31 | Discharge: 2022-02-03 | DRG: 698 | Disposition: A | Discharge status: Home Health | Payer: Medicare (Managed Care) | Attending: Internal Medicine | Admitting: Internal Medicine

## 2022-01-31 ENCOUNTER — Encounter (HOSPITAL_COMMUNITY): Payer: Self-pay

## 2022-01-31 ENCOUNTER — Emergency Department (HOSPITAL_COMMUNITY): Payer: Medicare (Managed Care)

## 2022-01-31 ENCOUNTER — Other Ambulatory Visit: Payer: Self-pay

## 2022-01-31 DIAGNOSIS — G9341 Metabolic encephalopathy: Secondary | ICD-10-CM | POA: Diagnosis present

## 2022-01-31 DIAGNOSIS — T839XXA Unspecified complication of genitourinary prosthetic device, implant and graft, initial encounter: Secondary | ICD-10-CM | POA: Diagnosis not present

## 2022-01-31 DIAGNOSIS — N390 Urinary tract infection, site not specified: Secondary | ICD-10-CM | POA: Diagnosis present

## 2022-01-31 DIAGNOSIS — Z87440 Personal history of urinary (tract) infections: Secondary | ICD-10-CM

## 2022-01-31 DIAGNOSIS — B961 Klebsiella pneumoniae [K. pneumoniae] as the cause of diseases classified elsewhere: Secondary | ICD-10-CM | POA: Diagnosis present

## 2022-01-31 DIAGNOSIS — N179 Acute kidney failure, unspecified: Secondary | ICD-10-CM | POA: Diagnosis present

## 2022-01-31 DIAGNOSIS — Z789 Other specified health status: Secondary | ICD-10-CM | POA: Diagnosis not present

## 2022-01-31 DIAGNOSIS — Z87891 Personal history of nicotine dependence: Secondary | ICD-10-CM

## 2022-01-31 DIAGNOSIS — Z515 Encounter for palliative care: Secondary | ICD-10-CM | POA: Diagnosis not present

## 2022-01-31 DIAGNOSIS — R339 Retention of urine, unspecified: Secondary | ICD-10-CM | POA: Diagnosis not present

## 2022-01-31 DIAGNOSIS — F32A Depression, unspecified: Secondary | ICD-10-CM | POA: Diagnosis present

## 2022-01-31 DIAGNOSIS — N400 Enlarged prostate without lower urinary tract symptoms: Secondary | ICD-10-CM | POA: Diagnosis present

## 2022-01-31 DIAGNOSIS — E872 Acidosis, unspecified: Secondary | ICD-10-CM | POA: Diagnosis present

## 2022-01-31 DIAGNOSIS — N138 Other obstructive and reflux uropathy: Secondary | ICD-10-CM | POA: Diagnosis present

## 2022-01-31 DIAGNOSIS — R338 Other retention of urine: Secondary | ICD-10-CM | POA: Diagnosis present

## 2022-01-31 DIAGNOSIS — L8990 Pressure ulcer of unspecified site, unspecified stage: Secondary | ICD-10-CM | POA: Insufficient documentation

## 2022-01-31 DIAGNOSIS — D638 Anemia in other chronic diseases classified elsewhere: Secondary | ICD-10-CM | POA: Diagnosis present

## 2022-01-31 DIAGNOSIS — I959 Hypotension, unspecified: Secondary | ICD-10-CM | POA: Diagnosis present

## 2022-01-31 DIAGNOSIS — T83511D Infection and inflammatory reaction due to indwelling urethral catheter, subsequent encounter: Secondary | ICD-10-CM | POA: Diagnosis not present

## 2022-01-31 DIAGNOSIS — Y846 Urinary catheterization as the cause of abnormal reaction of the patient, or of later complication, without mention of misadventure at the time of the procedure: Secondary | ICD-10-CM | POA: Diagnosis present

## 2022-01-31 DIAGNOSIS — E559 Vitamin D deficiency, unspecified: Secondary | ICD-10-CM | POA: Diagnosis present

## 2022-01-31 DIAGNOSIS — N401 Enlarged prostate with lower urinary tract symptoms: Secondary | ICD-10-CM | POA: Diagnosis present

## 2022-01-31 DIAGNOSIS — E785 Hyperlipidemia, unspecified: Secondary | ICD-10-CM | POA: Diagnosis present

## 2022-01-31 DIAGNOSIS — R296 Repeated falls: Secondary | ICD-10-CM | POA: Diagnosis present

## 2022-01-31 DIAGNOSIS — H8110 Benign paroxysmal vertigo, unspecified ear: Secondary | ICD-10-CM | POA: Diagnosis present

## 2022-01-31 DIAGNOSIS — D696 Thrombocytopenia, unspecified: Secondary | ICD-10-CM | POA: Diagnosis present

## 2022-01-31 DIAGNOSIS — I700 Atherosclerosis of aorta: Secondary | ICD-10-CM | POA: Diagnosis present

## 2022-01-31 DIAGNOSIS — T83511A Infection and inflammatory reaction due to indwelling urethral catheter, initial encounter: Principal | ICD-10-CM | POA: Diagnosis present

## 2022-01-31 DIAGNOSIS — R911 Solitary pulmonary nodule: Secondary | ICD-10-CM | POA: Diagnosis present

## 2022-01-31 DIAGNOSIS — F0283 Dementia in other diseases classified elsewhere, unspecified severity, with mood disturbance: Secondary | ICD-10-CM | POA: Diagnosis present

## 2022-01-31 DIAGNOSIS — G4752 REM sleep behavior disorder: Secondary | ICD-10-CM | POA: Diagnosis present

## 2022-01-31 DIAGNOSIS — Z79899 Other long term (current) drug therapy: Secondary | ICD-10-CM

## 2022-01-31 DIAGNOSIS — R188 Other ascites: Secondary | ICD-10-CM | POA: Diagnosis present

## 2022-01-31 DIAGNOSIS — G20 Parkinson's disease: Secondary | ICD-10-CM | POA: Diagnosis present

## 2022-01-31 DIAGNOSIS — I10 Essential (primary) hypertension: Secondary | ICD-10-CM | POA: Diagnosis present

## 2022-01-31 DIAGNOSIS — Z79891 Long term (current) use of opiate analgesic: Secondary | ICD-10-CM

## 2022-01-31 DIAGNOSIS — J9811 Atelectasis: Secondary | ICD-10-CM | POA: Diagnosis not present

## 2022-01-31 DIAGNOSIS — D649 Anemia, unspecified: Secondary | ICD-10-CM | POA: Diagnosis not present

## 2022-01-31 DIAGNOSIS — Z23 Encounter for immunization: Secondary | ICD-10-CM | POA: Diagnosis present

## 2022-01-31 DIAGNOSIS — G629 Polyneuropathy, unspecified: Secondary | ICD-10-CM | POA: Diagnosis present

## 2022-01-31 DIAGNOSIS — Z8616 Personal history of COVID-19: Secondary | ICD-10-CM | POA: Diagnosis not present

## 2022-01-31 DIAGNOSIS — G20A1 Parkinson's disease without dyskinesia, without mention of fluctuations: Secondary | ICD-10-CM | POA: Diagnosis present

## 2022-01-31 LAB — CBC WITH DIFFERENTIAL/PLATELET
Abs Immature Granulocytes: 0.06 10*3/uL (ref 0.00–0.07)
Basophils Absolute: 0 10*3/uL (ref 0.0–0.1)
Basophils Relative: 0 %
Eosinophils Absolute: 0 10*3/uL (ref 0.0–0.5)
Eosinophils Relative: 0 %
HCT: 28 % — ABNORMAL LOW (ref 39.0–52.0)
Hemoglobin: 8.8 g/dL — ABNORMAL LOW (ref 13.0–17.0)
Immature Granulocytes: 1 %
Lymphocytes Relative: 3 %
Lymphs Abs: 0.4 10*3/uL — ABNORMAL LOW (ref 0.7–4.0)
MCH: 26.7 pg (ref 26.0–34.0)
MCHC: 31.4 g/dL (ref 30.0–36.0)
MCV: 84.8 fL (ref 80.0–100.0)
Monocytes Absolute: 0.4 10*3/uL (ref 0.1–1.0)
Monocytes Relative: 3 %
Neutro Abs: 11.6 10*3/uL — ABNORMAL HIGH (ref 1.7–7.7)
Neutrophils Relative %: 93 %
Platelets: 190 10*3/uL (ref 150–400)
RBC: 3.3 MIL/uL — ABNORMAL LOW (ref 4.22–5.81)
RDW: 15.2 % (ref 11.5–15.5)
WBC: 12.5 10*3/uL — ABNORMAL HIGH (ref 4.0–10.5)
nRBC: 0 % (ref 0.0–0.2)

## 2022-01-31 LAB — URINALYSIS, ROUTINE W REFLEX MICROSCOPIC
Bilirubin Urine: NEGATIVE
Glucose, UA: NEGATIVE mg/dL
Ketones, ur: NEGATIVE mg/dL
Nitrite: NEGATIVE
Protein, ur: 30 mg/dL — AB
RBC / HPF: >50 RBC/hpf — ABNORMAL HIGH (ref 0–5)
Specific Gravity, Urine: 1.012 (ref 1.005–1.030)
WBC, UA: >50 WBC/hpf — ABNORMAL HIGH (ref 0–5)
pH: 6 (ref 5.0–8.0)

## 2022-01-31 LAB — COMPREHENSIVE METABOLIC PANEL
ALT: 6 U/L (ref 0–44)
AST: 21 U/L (ref 15–41)
Albumin: 3.2 g/dL — ABNORMAL LOW (ref 3.5–5.0)
Alkaline Phosphatase: 60 U/L (ref 38–126)
Anion gap: 8 (ref 5–15)
BUN: 40 mg/dL — ABNORMAL HIGH (ref 8–23)
CO2: 22 mmol/L (ref 22–32)
Calcium: 8.5 mg/dL — ABNORMAL LOW (ref 8.9–10.3)
Chloride: 106 mmol/L (ref 98–111)
Creatinine, Ser: 2.45 mg/dL — ABNORMAL HIGH (ref 0.61–1.24)
GFR, Estimated: 26 mL/min — ABNORMAL LOW (ref >60)
Glucose, Bld: 119 mg/dL — ABNORMAL HIGH (ref 70–99)
Potassium: 3.9 mmol/L (ref 3.5–5.1)
Sodium: 136 mmol/L (ref 135–145)
Total Bilirubin: 2.4 mg/dL — ABNORMAL HIGH (ref 0.3–1.2)
Total Protein: 7.6 g/dL (ref 6.5–8.1)

## 2022-01-31 LAB — LACTIC ACID, PLASMA: Lactic Acid, Venous: 2.5 mmol/L (ref 0.5–1.9)

## 2022-01-31 LAB — PROTIME-INR
INR: 1.2 (ref 0.8–1.2)
Prothrombin Time: 15.2 seconds (ref 11.4–15.2)

## 2022-01-31 LAB — APTT: aPTT: 40 seconds — ABNORMAL HIGH (ref 24–36)

## 2022-01-31 MED ORDER — SODIUM CHLORIDE 0.9 % IV SOLN
INTRAVENOUS | Status: DC
  Administered 2022-02-02: 1000 mL via INTRAVENOUS

## 2022-01-31 MED ORDER — CARBIDOPA-LEVODOPA 25-100 MG PO TABS
1.0000 | ORAL_TABLET | Freq: Every day | ORAL | Status: DC
  Administered 2022-01-31 – 2022-02-02 (×3): 1 via ORAL
  Filled 2022-01-31 (×3): qty 1

## 2022-01-31 MED ORDER — TAMSULOSIN HCL 0.4 MG PO CAPS
0.4000 mg | ORAL_CAPSULE | Freq: Every day | ORAL | Status: DC
  Administered 2022-01-31 – 2022-02-03 (×4): 0.4 mg via ORAL
  Filled 2022-01-31 (×4): qty 1

## 2022-01-31 MED ORDER — SODIUM CHLORIDE 0.9 % IV SOLN
1.0000 g | INTRAVENOUS | Status: DC
  Administered 2022-02-01: 1 g via INTRAVENOUS
  Filled 2022-01-31: qty 10

## 2022-01-31 MED ORDER — CARBIDOPA-LEVODOPA ER 50-200 MG PO TBCR
1.0000 | EXTENDED_RELEASE_TABLET | Freq: Every day | ORAL | Status: DC
  Administered 2022-01-31 – 2022-02-02 (×3): 1 via ORAL
  Filled 2022-01-31 (×3): qty 1

## 2022-01-31 MED ORDER — TRAZODONE HCL 50 MG PO TABS
50.0000 mg | ORAL_TABLET | Freq: Every day | ORAL | Status: DC
  Administered 2022-01-31 – 2022-02-02 (×3): 50 mg via ORAL
  Filled 2022-01-31 (×3): qty 1

## 2022-01-31 MED ORDER — ONDANSETRON HCL 4 MG/2ML IJ SOLN: 4.0000 mg | Freq: Four times a day (QID) | INTRAMUSCULAR | Status: DC | PRN

## 2022-01-31 MED ORDER — CARBIDOPA-LEVODOPA 25-100 MG PO TABS
2.0000 | ORAL_TABLET | Freq: Two times a day (BID) | ORAL | Status: DC
  Administered 2022-02-01 – 2022-02-03 (×5): 2 via ORAL
  Filled 2022-01-31 (×6): qty 2

## 2022-01-31 MED ORDER — SODIUM CHLORIDE 0.9 % IV SOLN
1.0000 g | Freq: Once | INTRAVENOUS | Status: AC
  Administered 2022-01-31: 1 g via INTRAVENOUS
  Filled 2022-01-31: qty 10

## 2022-01-31 MED ORDER — ALBUTEROL SULFATE (2.5 MG/3ML) 0.083% IN NEBU: 2.5000 mg | INHALATION_SOLUTION | RESPIRATORY_TRACT | Status: DC | PRN

## 2022-01-31 MED ORDER — LACTATED RINGERS IV BOLUS
1000.0000 mL | Freq: Once | INTRAVENOUS | Status: AC
  Administered 2022-01-31: 1000 mL via INTRAVENOUS

## 2022-01-31 MED ORDER — CHLORHEXIDINE GLUCONATE CLOTH 2 % EX PADS
6.0000 | MEDICATED_PAD | Freq: Every day | CUTANEOUS | Status: DC
  Administered 2022-02-01 – 2022-02-03 (×3): 6 via TOPICAL

## 2022-01-31 MED ORDER — ONDANSETRON HCL 4 MG PO TABS: 4.0000 mg | ORAL_TABLET | Freq: Four times a day (QID) | ORAL | Status: DC | PRN

## 2022-01-31 MED ORDER — SERTRALINE HCL 25 MG PO TABS
25.0000 mg | ORAL_TABLET | Freq: Every day | ORAL | Status: DC
  Administered 2022-01-31 – 2022-02-03 (×4): 25 mg via ORAL
  Filled 2022-01-31 (×4): qty 1

## 2022-01-31 MED ORDER — FLUDROCORTISONE ACETATE 0.1 MG PO TABS
0.1000 mg | ORAL_TABLET | Freq: Every day | ORAL | Status: DC
  Administered 2022-02-01 – 2022-02-03 (×3): 0.1 mg via ORAL
  Filled 2022-01-31 (×3): qty 1

## 2022-01-31 MED ORDER — ACETAMINOPHEN 650 MG RE SUPP: 650.0000 mg | Freq: Four times a day (QID) | RECTAL | Status: DC | PRN

## 2022-01-31 MED ORDER — ACETAMINOPHEN 325 MG PO TABS: 650.0000 mg | ORAL_TABLET | Freq: Four times a day (QID) | ORAL | Status: DC | PRN

## 2022-01-31 NOTE — ED Provider Triage Note (Signed)
Emergency Medicine Provider Triage Evaluation Note  Theodore King , a 76 y.o. male  was evaluated in triage.  Pt complains of pulled his foley out. The patient was seen here yesterday and diagnosed with a UTI. He pulled out his catheter this morning. Per EMS, family reports the patient is "off".  Review of Systems  Positive:  Negative:   Physical Exam  BP (!) 90/51   Pulse 87   Temp 97.6 F (36.4 C) (Oral)   Resp 18   SpO2 91%  Gen:   Awake, no distress   Resp:  Normal effort  MSK:   Moves extremities without difficulty  Other:    Medical Decision Making  Medically screening exam initiated at 10:28 AM.  Appropriate orders placed.  Theodore King was informed that the remainder of the evaluation will be completed by another provider, this initial triage assessment does not replace that evaluation, and the importance of remaining in the ED until their evaluation is complete.  Chronic foley use. Patient is hypotensive and has lower pulse ox than he did yesterday. Afebrile. Given chronic foley use with UTI, will order lactic and cultures.    Sherrell Puller, PA-C 01/31/22 1031

## 2022-01-31 NOTE — H&P (Addendum)
History and Physical    Patient: Theodore King:[id number] DOB: [birth year] DOA: 01/31/2022 DOS: the patient was seen and examined on 01/31/2022 PCP: [name], MD  Patient coming from: Home  Chief Complaint:  Chief Complaint  Patient presents with   Urinary Retention   HPI: Theodore King is a 76 y.o. male with medical history significant of abnormal dreams, aortic atherosclerosis, BPH, bursitis and tendinitis of the shoulder, hyperglycemia, hyperlipidemia, toxic neuropathy, Parkinson's disease, COVID-19 pneumonia, REM sleep behavior disorder, dyspnea, vitamin D deficiency, benign paroxysmal vertigo, urinary retention, chronic Foley catheter use who was brought to the emergency department after pulling out his Foley catheter at home.  His appetite is decreased.  He is mentation is decreased as well.  No fever, vomiting or diarrhea at home.  ED course: Initial vital signs were temperature 97.6 F, pulse 87, respiration 18, BP 90/51 mmHg and O2 sat 91% on room air.  The patient had his catheter replaced, received 1000 mL of LR bolus and 1 g of Rocephin.  Lab work: He is urinalysis showed large leukocyte esterase, moderate hemoglobinuria, more than 50 RBC, more than 50 WBC, many bacteria and WBC clumps on microscopic examination.  Lactic acid was 2.5 mmol/L.  CBC with a white count of 12.5, hemoglobin 8.8 g/dL platelets 190.  PT and INR were normal.  PTT mildly elevated at 40 seconds.  CMP showed normal electrolytes when calcium is corrected.  Glucose 119, BUN 40, creatinine 2.45 and bilirubin 2.4 mg/dL.  Albumin was 3.2 g/dL.  The rest of the LFTs were normal.  Imaging: Portable chest radiograph showing bibasilar subsegmental atelectasis.  CT renal study with mild enlarged prostate, no stones.  There is a 1.7 cm right middle lobe pulmonary nodule suspicions for neoplasm.  Chest CT with contrast is recommended.   Review of Systems: As mentioned in the history of present illness. All  other systems reviewed and are negative.  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)    Paralysis agitans ([place]) 05/18/2013   Parkinson's disease ([place])    Pneumonia due to COVID-19 virus 04/08/2019   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   SOB (shortness of breath) 03/23/2018   Unspecified vitamin D deficiency    Unspecified vitamin D deficiency    Vertigo, benign paroxysmal 11/05/2016   Vitamin D deficiency 05/18/2013   Past Surgical History:  Procedure Laterality Date   APPENDECTOMY     COLONOSCOPY WITH PROPOFOL N/A 10/26/2021   Procedure: COLONOSCOPY WITH PROPOFOL;  Surgeon: Irene Shipper, MD;  Location: [facility];  Service: Gastroenterology;  Laterality: N/A;   POLYPECTOMY  10/26/2021   Procedure: POLYPECTOMY;  Surgeon: Irene Shipper, MD;  Location: [facility];  Service: Gastroenterology;;   Cherre Robins CUFF REPAIR     right   Social History:  reports that he quit smoking about 47 years ago. His smoking use included cigarettes. He started smoking about 68 years ago. He has a 10.00 pack-year smoking history. He has never used smokeless tobacco. He reports that he does not currently use alcohol. He reports that he does not use drugs.  No Known Allergies  Family History  Problem Relation Age of Onset   Cancer Mother    Cancer Father    Stroke Brother    Cancer Sister    Healthy Daughter    Cancer Sister    Lung cancer Brother    Healthy Daughter    Colon cancer Neg Hx    Esophageal cancer Neg Hx    Rectal cancer Neg Hx    Stomach cancer Neg Hx     Prior to Admission medications    Medication Sig Start Date End Date Taking? Authorizing Provider  acetaminophen (TYLENOL) 500 MG tablet Take 500 mg by mouth every 6 (six) hours as needed for mild pain.    [provider]  albuterol (VENTOLIN HFA) 108 (90 Base) MCG/ACT inhaler Inhale 2 puffs into the lungs every 4 (four) hours as needed for wheezing or shortness of breath.  10/03/18   [provider]  carbidopa-levodopa (SINEMET CR) 50-200 MG tablet Take 1 tablet by mouth at bedtime. 09/24/21   Penumalli, Earlean Polka, MD  carbidopa-levodopa (SINEMET IR) 25-100 MG tablet Take 2 tablets at 7 AM, 2 tablets at 10 AM, 2 tablets at 2 PM and 1 tablet at 6 PM Patient taking differently: Take 1-2 tablets by mouth See admin instructions. Take 2 tablets by mouth every morning and afternoon, take 1 tablet at bedtime 09/24/21   Penumalli, Earlean Polka, MD  cephALEXin (KEFLEX) 500 MG capsule Take 1 capsule (500 mg total) by mouth 4 (four) times daily for 10 days. 01/30/22 02/09/22  Kneller, Norman Herrlich, DO  Cholecalciferol (VITAMIN D3 ULTRA POTENCY) 1.25 MG (50000 UT) TABS Take 50,000 Units by mouth once a week.    [provider]  fludrocortisone (FLORINEF) 0.1 MG tablet Take 0.1 mg by mouth daily.    [provider]  glycopyrrolate (ROBINUL) 1 MG tablet Take 1 tablet (1 mg total) by mouth 2 (two) times daily. 09/24/21   Penumalli, Earlean Polka, MD  sertraline (ZOLOFT) 25 MG tablet Take 1 tablet (25 mg total) by mouth daily. 09/24/21   Penumalli, Earlean Polka, MD  tamsulosin (FLOMAX) 0.4 MG CAPS capsule Take 1 capsule (0.4 mg total) by mouth daily. 04/16/20   Vanessa Kick, MD  traZODone (DESYREL) 50 MG tablet Take 50 mg by mouth at bedtime. 08/17/20   [provider]    Physical Exam: Vitals:   01/31/22 1244 01/31/22 1300 01/31/22 1448 01/31/22 1600  BP: (!) 118/50 (!) 120/51 107/69 126/61  Pulse: 76  75 77  Resp: '16 17 18 16  '$ Temp:   98.1 F (36.7 C)   TempSrc:   Oral   SpO2: 100% 99% 97% 100%  Weight: 77.1 kg      Height: '6\' 1"'$  (1.854 m)      Physical Exam Vitals and nursing note reviewed.  Constitutional:      General: He is awake. He is not in acute distress.    Appearance: Normal appearance. He is ill-appearing.  HENT:     Head: Normocephalic.     Mouth/Throat:     Mouth: Mucous membranes are dry.  Eyes:     General: No scleral icterus.    Pupils: Pupils are equal, round, and reactive to light.  Neck:     Vascular: No JVD.  Cardiovascular:     Rate and Rhythm: Normal rate and regular rhythm.     Heart sounds: S1 normal and S2 normal.  Pulmonary:     Breath sounds: Examination of the right-lower field reveals decreased breath sounds. Examination of  the left-lower field reveals decreased breath sounds. Decreased breath sounds present. No wheezing, rhonchi or rales.  Abdominal:     General: Bowel sounds are normal. There is no distension.     Palpations: Abdomen is soft.     Tenderness: There is no abdominal tenderness. There is no right CVA tenderness or left CVA tenderness.  Musculoskeletal:     Cervical back: Neck supple.     Right lower leg: No edema.     Left lower leg: No edema.  Skin:    General: Skin is warm and dry.  Neurological:     General: No focal deficit present.     Mental Status: He is alert. He is disoriented.  Psychiatric:        Mood and Affect: Mood normal.        Behavior: Behavior is cooperative.   Data Reviewed:  There are no new results to review at this time.  Assessment and Plan: Principal Problem:   AKI (acute kidney injury) ([place]) Observation/telemetry. Continue IV fluids. Avoid hypotension. Avoid nephrotoxins. Monitor intake and output. Monitor renal function electrolytes.  Active Problems:   Foley catheter problem, initial encounter ([place]) Questionable urine tract infection. Given possible bladder/urethral trauma We will continue ceftriaxone. Follow-up urine culture and sensitivity.    Pulmonary nodule CT chest with contrast after AKI  resolves.    Parkinson's disease ([place]) Sinemet IR 4 times a day. Sinemet CR at bedtime. Supportive care.    BPH (benign prostatic hyperplasia) Continue tamsulosin 0.4 mg p.o. daily.    Normocytic anemia Monitor hematocrit and hemoglobin.    Essential hypertension Currently not on antihypertensives. Monitor blood pressure closely.     Advance Care Planning:   Code Status: Full Code   Consults:   Family Communication:   Severity of Illness: The appropriate patient status for this patient is OBSERVATION. Observation status is judged to be reasonable and necessary in order to provide the required intensity of service to ensure the patient's safety. The patient's presenting symptoms, physical exam findings, and initial radiographic and laboratory data in the context of their medical condition is felt to place them at decreased risk for further clinical deterioration. Furthermore, it is anticipated that the patient will be medically stable for discharge from the hospital within 2 midnights of admission.   Author: Reubin Milan, MD 01/31/2022 5:02 PM  For on call review [email or web address].   This document was prepared using Dragon voice recognition software and may contain some unintended transcription errors.

## 2022-01-31 NOTE — ED Notes (Signed)
Pharmacy called about pt med verification

## 2022-01-31 NOTE — ED Provider Notes (Signed)
[facility] DEPT Provider Note   CSN: [id number] Arrival date & time: 01/31/22  1011     History  Chief Complaint  Patient presents with   Urinary Retention    Theodore King is a 76 y.o. male.  Patient as above with significant medical history as below, including Parkinson's disease, HLD, chronic dyspnea, urinary retention who presents to the ED with complaint of displaced Foley.  Patient was seen in the ED yesterday, started on oral antibiotics for possible UTI.  This morning daughter reports the patient removed his Foley catheter, he often will tug on his catheter this morning she went to check on him and the catheter was displaced.  Follows with [facility].  No fevers last 24 hours.  Poor p.o. intake.  No witnessed vomiting.  Patient has a contribute history, history provided by family member at bedside who is primary caretaker.     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)    Paralysis agitans ([place]) 05/18/2013   Parkinson's disease ([place])    Pneumonia due to COVID-19 virus 04/08/2019   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   SOB (shortness of breath) 03/23/2018   Unspecified vitamin D deficiency    Unspecified vitamin D deficiency    Vertigo, benign paroxysmal 11/05/2016   Vitamin D deficiency 05/18/2013    Past Surgical History:  Procedure Laterality Date   APPENDECTOMY     COLONOSCOPY WITH PROPOFOL N/A 10/26/2021   Procedure: COLONOSCOPY WITH PROPOFOL;  Surgeon: Irene Shipper, MD;  Location: [facility];  Service: Gastroenterology;  Laterality:  N/A;   POLYPECTOMY  10/26/2021   Procedure: POLYPECTOMY;  Surgeon: Irene Shipper, MD;  Location: [facility];  Service: Gastroenterology;;   ROTATOR CUFF REPAIR     right     The history is provided by the patient and a relative. No language interpreter was used.       Home Medications Prior to Admission medications   Medication Sig Start Date End Date Taking? Authorizing Provider  acetaminophen (TYLENOL) 500 MG tablet Take 500 mg by mouth every 6 (six) hours as needed for mild pain.    [provider]  albuterol (VENTOLIN HFA) 108 (90 Base) MCG/ACT inhaler Inhale 2 puffs into the lungs every 4 (four) hours as needed for wheezing or shortness of breath.  10/03/18   [provider]  carbidopa-levodopa (SINEMET CR) 50-200 MG tablet Take 1 tablet by mouth at bedtime. 09/24/21   Penumalli, Earlean Polka, MD  carbidopa-levodopa (SINEMET IR) 25-100 MG tablet Take 2 tablets at 7 AM, 2 tablets at 10 AM, 2 tablets at 2 PM and 1 tablet at 6 PM Patient taking differently: Take 1-2 tablets by mouth See admin instructions. Take 2 tablets by mouth every morning and afternoon, take 1 tablet at bedtime 09/24/21   Penumalli, Earlean Polka, MD  cephALEXin (KEFLEX) 500 MG capsule Take 1 capsule (500 mg total) by mouth 4 (four) times daily for 10 days. 01/30/22 02/09/22  Kneller, Norman Herrlich, DO  Cholecalciferol (VITAMIN D3 ULTRA POTENCY) 1.25 MG (50000 UT) TABS Take 50,000 Units by mouth once a week.  [provider]  fludrocortisone (FLORINEF) 0.1 MG tablet Take 0.1 mg by mouth daily.    [provider]  glycopyrrolate (ROBINUL) 1 MG tablet Take 1 tablet (1 mg total) by mouth 2 (two) times daily. 09/24/21   Penumalli, Earlean Polka, MD  sertraline (ZOLOFT) 25 MG tablet Take 1 tablet (25 mg total) by mouth daily. 09/24/21   Penumalli, Earlean Polka, MD  tamsulosin (FLOMAX) 0.4 MG CAPS capsule Take 1 capsule (0.4 mg total) by mouth daily. 04/16/20   Vanessa Kick, MD  traZODone (DESYREL) 50 MG tablet  Take 50 mg by mouth at bedtime. 08/17/20   [provider]      Allergies    Patient has no known allergies.    Review of Systems   Review of Systems  Unable to perform ROS: Dementia    Physical Exam Updated Vital Signs BP 126/61   Pulse 77   Temp 98.1 F (36.7 C) (Oral)   Resp 16   Ht '6\' 1"'$  (1.854 m)   Wt 77.1 kg   SpO2 100%   BMI 22.43 kg/m  Physical Exam Vitals and nursing note reviewed.  Constitutional:      General: He is not in acute distress.    Appearance: Normal appearance. He is well-developed.  HENT:     Head: Normocephalic and atraumatic.     Right Ear: External ear normal.     Left Ear: External ear normal.     Mouth/Throat:     Mouth: Mucous membranes are moist.  Eyes:     General: No scleral icterus. Cardiovascular:     Rate and Rhythm: Normal rate and regular rhythm.     Pulses: Normal pulses.     Heart sounds: Normal heart sounds.  Pulmonary:     Effort: Pulmonary effort is normal. No respiratory distress.     Breath sounds: Normal breath sounds.  Abdominal:     General: Abdomen is flat. There is distension.     Palpations: Abdomen is soft.     Tenderness: There is abdominal tenderness.     Comments: Distension suprapubic >1,000 mL on bedside bladder scan  Musculoskeletal:        General: Normal range of motion.     Cervical back: Normal range of motion.     Right lower leg: No edema.     Left lower leg: No edema.  Skin:    General: Skin is warm and dry.     Capillary Refill: Capillary refill takes less than 2 seconds.  Neurological:     Mental Status: He is alert and oriented to person, place, and time.  Psychiatric:        Mood and Affect: Mood normal.        Behavior: Behavior normal.     ED Results / Procedures / Treatments   Labs (all labs ordered are listed, but only abnormal results are displayed) Labs Reviewed  LACTIC ACID, PLASMA - Abnormal; Notable for the following components:      Result Value   Lactic Acid,  Venous 2.5 (*)    All other components within normal limits  COMPREHENSIVE METABOLIC PANEL - Abnormal; Notable for the following components:   Glucose, Bld 119 (*)    BUN 40 (*)    Creatinine, Ser 2.45 (*)    Calcium 8.5 (*)    Albumin 3.2 (*)    Total Bilirubin 2.4 (*)    GFR, Estimated 26 (*)    All other components within normal limits  CBC WITH DIFFERENTIAL/PLATELET - Abnormal; Notable for the following components:   WBC 12.5 (*)    RBC 3.30 (*)    Hemoglobin 8.8 (*)    HCT 28.0 (*)    Neutro Abs 11.6 (*)    Lymphs Abs 0.4 (*)    All other components within normal limits  APTT - Abnormal; Notable for the following components:   aPTT 40 (*)    All other components within normal limits  URINALYSIS, ROUTINE W REFLEX MICROSCOPIC - Abnormal; Notable for the following components:   APPearance CLOUDY (*)    Hgb urine dipstick MODERATE (*)    Protein, ur 30 (*)    Leukocytes,Ua LARGE (*)    RBC / HPF >50 (*)    WBC, UA >50 (*)    Bacteria, UA MANY (*)    All other components within normal limits  CULTURE, BLOOD (ROUTINE X 2)  CULTURE, BLOOD (ROUTINE X 2)  URINE CULTURE  PROTIME-INR  LACTIC ACID, PLASMA    EKG None  Radiology CT Renal Stone Study  Result Date: 01/31/2022 CLINICAL DATA:  Flank pain.  Dark urine. EXAM: CT ABDOMEN AND PELVIS WITHOUT CONTRAST TECHNIQUE: Multidetector CT imaging of the abdomen and pelvis was performed following the standard protocol without IV contrast. RADIATION DOSE REDUCTION: This exam was performed according to the departmental dose-optimization program which includes automated exposure control, adjustment of the mA and/or kV according to patient size and/or use of iterative reconstruction technique. COMPARISON:  10/23/2021 FINDINGS: Lower chest: Pulmonary nodule is seen in the posterior right middle lobe which is new since previous study, but incompletely visualized. This measures at least 1.7 cm, and is and suspicious for neoplasm.  Hepatobiliary: No mass visualized on this unenhanced exam. Gallbladder is unremarkable. No evidence of biliary ductal dilatation. Pancreas: No mass or inflammatory process visualized on this unenhanced exam. Spleen:  Within normal limits in size. Adrenals/Urinary tract: No evidence of urolithiasis or hydronephrosis. Foley catheter is seen within the urinary bladder. Stomach/Bowel: No evidence of obstruction, inflammatory process, or abnormal fluid collections. Vascular/Lymphatic: No pathologically enlarged lymph nodes identified. No evidence of abdominal aortic aneurysm. Aortic atherosclerotic calcification incidentally noted. Reproductive:  Mildly enlarged prostate. Other: Mild ascites in pelvis and diffuse mesenteric and body wall edema, increased since prior exam. Musculoskeletal:  No suspicious bone lesions identified. IMPRESSION: No evidence of urolithiasis or hydronephrosis. Mildly enlarged prostate. Mild ascites, and diffuse mesenteric and body wall edema. 1.7 cm right middle lobe pulmonary nodule, suspicious for neoplasm. Chest CT with contrast is recommended for further evaluation. Electronically Signed   By: Marlaine Hind M.D.   On: 01/31/2022 14:45   DG Chest Port 1 View  Result Date: 01/31/2022 CLINICAL DATA:  Sepsis. EXAM: PORTABLE CHEST 1 VIEW COMPARISON:  November 18, 2021. FINDINGS: Stable cardiomediastinal silhouette. Stable elevated left hemidiaphragm is noted. Minimal bibasilar subsegmental atelectasis is noted. Bony thorax is unremarkable. IMPRESSION: Stable elevated left hemidiaphragm. Minimal bibasilar subsegmental atelectasis. Electronically Signed   By: Marijo Conception M.D.   On: 01/31/2022 11:01    Procedures .Critical Care  Performed by: Jeanell Sparrow, DO Authorized by: Jeanell Sparrow, DO   Critical care provider statement:    Critical care time (minutes):  30   Critical care time was exclusive of:  Separately billable procedures and treating other patients   Critical care was  necessary to treat or prevent imminent or life-threatening deterioration of the following conditions:  Dehydration   Critical care was time spent personally by me  on the following activities:  Development of treatment plan with patient or surrogate, discussions with consultants, evaluation of patient's response to treatment, examination of patient, ordering and review of laboratory studies, ordering and review of radiographic studies, ordering and performing treatments and interventions, pulse oximetry, re-evaluation of patient's condition, review of old charts and obtaining history from patient or surrogate   Care discussed with: admitting provider       Medications Ordered in ED Medications  lactated ringers bolus 1,000 mL (has no administration in time range)  cefTRIAXone (ROCEPHIN) 1 g in sodium chloride 0.9 % 100 mL IVPB (has no administration in time range)    ED Course/ Medical Decision Making/ A&P Clinical Course as of 01/31/22 1633  Sun Jan 31, 2022  1452 IMPRESSION: No evidence of urolithiasis or hydronephrosis.  Mildly enlarged prostate.  Mild ascites, and diffuse mesenteric and body wall edema.  1.7 cm right middle lobe pulmonary nodule, suspicious for neoplasm. Chest CT with contrast is recommended for further evaluation.  Per radiology [SG]    Clinical Course User Index [SG] Jeanell Sparrow, DO                           Medical Decision Making Amount and/or Complexity of Data Reviewed Radiology: ordered.  Risk Decision regarding hospitalization.   This patient presents to the ED with chief complaint(s) of foley dysfunction, abd pain with pertinent past medical history of parkinson's disease, urinary retention which further complicates the presenting complaint. The complaint involves an extensive differential diagnosis and also carries with it a high risk of complications and morbidity.     Differential diagnosis includes but is not exclusive to acute  appendicitis, renal colic, testicular torsion, urinary tract infection, prostatitis,  diverticulitis, small bowel obstruction, colitis, abdominal aortic aneurysm, gastroenteritis, constipation etc.  . Serious etiologies were considered.   The initial plan is to bladder scan, replace foley if +, screening labs/imaging    Additional history obtained: Additional history obtained from family Records reviewed  prior ed visits, prior labs/imaging   Independent labs interpretation:  The following labs were independently interpreted:  UA c/w UTI, send culture, give rocephin. AKI on CMP, LA is 2.5. BUN elevated at 40, concern for dehydration, continue IVF  Independent visualization of imaging: - I independently visualized the following imaging with scope of interpretation limited to determining acute life threatening conditions related to emergency care: ct renal, which revealed enlarged prostate, pulmonary nodule concerning for ?neoplasm. Cxr was stable  Cardiac monitoring was reviewed and interpreted by myself which shows NSR  Treatment and Reassessment: Foley catheter IVF rocephin >> discomfort improved with foley  Consultation: - Consulted or discussed management/test interpretation w/ external professional: n/a  Consideration for admission or further workup: Admission was considered    Patient with complicated UTI in setting of indwelling Foley catheter.  He also has AKI. Lactic acidosis. UTI, leuocytosis.  Recommend admission for uti in setting of AKI. Pt agreeable, spoke w/ dr Olevia Bowens who accepts pt for admission.    Social Determinants of health: Social History   Tobacco Use   Smoking status: Former    Packs/day: 0.50    Years: 20.00    Total pack years: 10.00    Types: Cigarettes    Start date: 08/14/1953    Quit date: 05/23/1974    Years since quitting: 47.7   Smokeless tobacco: Never  Vaping Use   Vaping Use: Never used  Substance Use Topics  Alcohol use: Not  Currently   Drug use: No            Final Clinical Impression(s) / ED Diagnoses Final diagnoses:  Urinary tract infection associated with indwelling urethral catheter, subsequent encounter  AKI (acute kidney injury) [facility])  Pulmonary nodule    Rx / DC Orders ED Discharge Orders     None         Jeanell Sparrow, DO 01/31/22 1633

## 2022-01-31 NOTE — ED Triage Notes (Signed)
Pt arrived via EMS, from home, pulled foley out this morning. Per EMS, pt concerned pt has " not been himself" was seen yesterday, dx with UTI.  Hx of dementia

## 2022-02-01 DIAGNOSIS — I10 Essential (primary) hypertension: Secondary | ICD-10-CM | POA: Diagnosis not present

## 2022-02-01 DIAGNOSIS — N179 Acute kidney failure, unspecified: Secondary | ICD-10-CM | POA: Diagnosis not present

## 2022-02-01 DIAGNOSIS — G9341 Metabolic encephalopathy: Secondary | ICD-10-CM

## 2022-02-01 DIAGNOSIS — N400 Enlarged prostate without lower urinary tract symptoms: Secondary | ICD-10-CM

## 2022-02-01 DIAGNOSIS — T839XXA Unspecified complication of genitourinary prosthetic device, implant and graft, initial encounter: Secondary | ICD-10-CM

## 2022-02-01 DIAGNOSIS — R911 Solitary pulmonary nodule: Secondary | ICD-10-CM

## 2022-02-01 DIAGNOSIS — D649 Anemia, unspecified: Secondary | ICD-10-CM

## 2022-02-01 DIAGNOSIS — R339 Retention of urine, unspecified: Secondary | ICD-10-CM

## 2022-02-01 DIAGNOSIS — G20 Parkinson's disease: Secondary | ICD-10-CM

## 2022-02-01 LAB — COMPREHENSIVE METABOLIC PANEL
ALT: 6 U/L (ref 0–44)
AST: 15 U/L (ref 15–41)
Albumin: 2.5 g/dL — ABNORMAL LOW (ref 3.5–5.0)
Alkaline Phosphatase: 47 U/L (ref 38–126)
Anion gap: 7 (ref 5–15)
BUN: 36 mg/dL — ABNORMAL HIGH (ref 8–23)
CO2: 23 mmol/L (ref 22–32)
Calcium: 8.6 mg/dL — ABNORMAL LOW (ref 8.9–10.3)
Chloride: 113 mmol/L — ABNORMAL HIGH (ref 98–111)
Creatinine, Ser: 1.69 mg/dL — ABNORMAL HIGH (ref 0.61–1.24)
GFR, Estimated: 41 mL/min — ABNORMAL LOW (ref >60)
Glucose, Bld: 91 mg/dL (ref 70–99)
Potassium: 3.9 mmol/L (ref 3.5–5.1)
Sodium: 143 mmol/L (ref 135–145)
Total Bilirubin: 1.2 mg/dL (ref 0.3–1.2)
Total Protein: 6.1 g/dL — ABNORMAL LOW (ref 6.5–8.1)

## 2022-02-01 LAB — URINE CULTURE: Culture: >=100000 — AB

## 2022-02-01 LAB — CBC
HCT: 24.9 % — ABNORMAL LOW (ref 39.0–52.0)
Hemoglobin: 7.8 g/dL — ABNORMAL LOW (ref 13.0–17.0)
MCH: 26.8 pg (ref 26.0–34.0)
MCHC: 31.3 g/dL (ref 30.0–36.0)
MCV: 85.6 fL (ref 80.0–100.0)
Platelets: 149 10*3/uL — ABNORMAL LOW (ref 150–400)
RBC: 2.91 MIL/uL — ABNORMAL LOW (ref 4.22–5.81)
RDW: 15.2 % (ref 11.5–15.5)
WBC: 10.5 10*3/uL (ref 4.0–10.5)
nRBC: 0 % (ref 0.0–0.2)

## 2022-02-01 MED ORDER — FOLIC ACID 1 MG PO TABS
1.0000 mg | ORAL_TABLET | Freq: Every day | ORAL | Status: DC
  Administered 2022-02-01 – 2022-02-03 (×3): 1 mg via ORAL
  Filled 2022-02-01 (×3): qty 1

## 2022-02-01 MED ORDER — ORAL CARE MOUTH RINSE: 15.0000 mL | OROMUCOSAL | Status: DC | PRN

## 2022-02-01 NOTE — Progress Notes (Signed)
Chaplain engaged in an initial visit with Theodore King.  Chaplain responded to consult for an Advanced Directive but recognized that Cary did not understand what Chaplain was saying.  Chaplain checked in with nurse that voiced that [place] did not have the ability to complete the Advanced Directive.  Chaplain is available to express this to [facility] daughter who possibly wanted to complete the healthcare POA document that Spyros does not have the capacity or cognition at this time to go forth with this paperwork.      02/01/22 0900  Clinical Encounter Type  Visited With Patient;Health care provider  Visit Type Initial;Social support  Referral From Nurse  Consult/Referral To Chaplain

## 2022-02-01 NOTE — Plan of Care (Signed)

## 2022-02-01 NOTE — Progress Notes (Addendum)
PROGRESS NOTE    Theodore King  GUY:[id number] DOB: [birth year] DOA: 01/31/2022 PCP: [name], MD   Brief Narrative:  76 year old male with history of aortic atherosclerosis, BPH, hyperlipidemia, Parkinson's disease, COVID-19 pneumonia, REM sleep behavior disorder, BPPV, urinary retention, chronic Foley catheter use presented with urinary retention after pulling out his Foley catheter at home.  On presentation, patient had his catheter replaced.  UA was suggestive of UTI; WBC 12.5, creatinine of 2.45.  He was started on IV fluids and antibiotics.  Chest x-ray showed bibasilar subsegmental atelectasis.  CT renal stone study showed mild enlarged prostate, no stones; 1.7 cm right middle lobe pulmonary nodule suspicious for neoplasm for which CT chest with contrast was recommended.  Assessment & Plan:   Acute kidney injury -Possible UTI and because patient removed his Foley catheter.  Creatinine 2.45 on presentation.  Improving with IV fluids.  Creatinine 1.69 this morning.  Repeat a.m. labs.  Continue IV fluids.  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  Continue Flomax.  Outpatient follow-up with urology  UTI -Continue Rocephin.  Urine cultures from 01/30/2022 grew Klebsiella pneumoniae.  Follow cultures from admission on 5/63/8756  Acute metabolic encephalopathy -Currently slightly confused.  Unclear if the patient has baseline dementia from Parkinson's disease. -Monitor mental status.  Fall precautions.  PT eval.  Start empiric folic acid supplementation.  Check B12, TSH, ammonia level in AM.  Leukocytosis -Resolved  Anemia chronic disease -hemoglobin stable.  Monitor intermittently  Thrombocytopenia -Questionable cause.  No signs of bleeding.  Monitor intermittently  Pulmonary nodule -CT chest with contrast after AKI resolves, possibly as an  outpatient  Parkinson's disease -Continue current regimen of Sinemet.  Outpatient follow-up with neurology  Anxiety/depression -continue sertraline/trazodone  Goals of care -Currently lives with his full code.  Will request palliative care consult for goals of care discussion.  DVT prophylaxis: Start Lovenox Code Status: Full Family Communication: None at bedside Disposition Plan: Status is: Inpatient Remains inpatient appropriate because: Of severity of illness  Consultants: None  Procedures: None  Antimicrobials: Rocephin from 01/31/2022 onwards   Subjective: Patient seen and examined at bedside.  Slightly confused to time.  Slow to respond.  Poor historian.  No overnight fever, agitation, seizures reported.  Objective: Vitals:   01/31/22 2325 01/31/22 2331 02/01/22 0430 02/01/22 0836  BP:  (!) 130/51 (!) 113/56 127/60  Pulse:  66 (!) 58 (!) 59  Resp:  '17 18 16  '$ Temp:  98.1 F (36.7 C) 97.8 F (36.6 C) 97.8 F (36.6 C)  TempSrc:  Oral Oral Oral  SpO2:  96% 100% 100%  Weight: 77.1 kg     Height: '6\' 1"'$  (1.854 m)       Intake/Output Summary (Last 24 hours) at 02/01/2022 1135 Last data filed at 02/01/2022 0600 Gross per 24 hour  Intake 1866.55 ml  Output 2875 ml  Net -1008.45 ml   Filed Weights   01/31/22 1244 01/31/22 2325  Weight: 77.1 kg 77.1 kg    Examination:  General exam: Appears calm and comfortable.  Currently on room air.  Elderly male lying in bed. Respiratory system: Bilateral decreased breath sounds at bases with some scattered crackles Cardiovascular system: S1 & S2 heard, intermittently bradycardic gastrointestinal system: Abdomen is nondistended, soft and nontender. Normal bowel sounds heard. Extremities: No cyanosis, clubbing, edema  Central nervous system: Awake, extremely slow to respond, slightly confused to time.  Poor historian.  No focal neurological deficits. Moving extremities Skin: No rashes, lesions or ulcers Psychiatry: Flat  affect.  No signs of agitation. Genitourinary: Indwelling Foley catheter present   Data Reviewed: I have personally reviewed following labs and imaging studies  CBC: Recent Labs  Lab 01/30/22 0753 01/31/22 1216 02/01/22 0343  WBC 9.0 12.5* 10.5  NEUTROABS 7.6 11.6*  --   HGB 9.5* 8.8* 7.8*  HCT 29.6* 28.0* 24.9*  MCV 82.7 84.8 85.6  PLT 198 190 244*   Basic Metabolic Panel: Recent Labs  Lab 01/30/22 0753 01/31/22 1216 02/01/22 0343  NA 140 136 143  K 4.0 3.9 3.9  CL 108 106 113*  CO2 '25 22 23  '$ GLUCOSE 127* 119* 91  BUN 28* 40* 36*  CREATININE 1.25* 2.45* 1.69*  CALCIUM 8.9 8.5* 8.6*   GFR: Estimated Creatinine Clearance: 39.3 mL/min (A) (by C-G formula based on SCr of 1.69 mg/dL (H)). Liver Function Tests: Recent Labs  Lab 01/30/22 0753 01/31/22 1216 02/01/22 0343  AST '28 21 15  '$ ALT '19 6 6  '$ ALKPHOS 70 60 47  BILITOT 0.6 2.4* 1.2  PROT 7.9 7.6 6.1*  ALBUMIN 3.0* 3.2* 2.5*   Recent Labs  Lab 01/30/22 0753  LIPASE 51   No results for input(s): "AMMONIA" in the last 168 hours. Coagulation Profile: Recent Labs  Lab 01/31/22 1216  INR 1.2   Cardiac Enzymes: No results for input(s): "CKTOTAL", "CKMB", "CKMBINDEX", "TROPONINI" in the last 168 hours. BNP (last 3 results) No results for input(s): "PROBNP" in the last 8760 hours. HbA1C: No results for input(s): "HGBA1C" in the last 72 hours. CBG: No results for input(s): "GLUCAP" in the last 168 hours. Lipid Profile: No results for input(s): "CHOL", "HDL", "LDLCALC", "TRIG", "CHOLHDL", "LDLDIRECT" in the last 72 hours. Thyroid Function Tests: No results for input(s): "TSH", "T4TOTAL", "FREET4", "T3FREE", "THYROIDAB" in the last 72 hours. Anemia Panel: No results for input(s): "VITAMINB12", "FOLATE", "FERRITIN", "TIBC", "IRON", "RETICCTPCT" in the last 72 hours. Sepsis Labs: Recent Labs  Lab 01/31/22 1216  LATICACIDVEN 2.5*    Recent Results (from the past 240 hour(s))  Urine Culture     Status:  Abnormal   Collection Time: 01/30/22 10:28 AM   Specimen: Urine, Catheterized  Result Value Ref Range Status   Specimen Description   Final    URINE, CATHETERIZED Performed at [facility] [address] [place], [place] [address]    Special Requests   Final    NONE Performed at [facility], [place] [address], [place], [place] [address]    Culture >=100,000 COLONIES/mL KLEBSIELLA PNEUMONIAE (A)  Final   Report Status 02/01/2022 FINAL  Final   Organism ID, Bacteria KLEBSIELLA PNEUMONIAE (A)  Final      Susceptibility   Klebsiella pneumoniae - MIC*    AMPICILLIN RESISTANT Resistant     CEFAZOLIN <=4 SENSITIVE Sensitive     CEFEPIME <=0.12 SENSITIVE Sensitive     CEFTRIAXONE <=0.25 SENSITIVE Sensitive     CIPROFLOXACIN <=0.25 SENSITIVE Sensitive     GENTAMICIN <=1 SENSITIVE Sensitive     IMIPENEM <=0.25 SENSITIVE Sensitive     NITROFURANTOIN 64 INTERMEDIATE Intermediate     TRIMETH/SULFA <=20 SENSITIVE Sensitive     AMPICILLIN/SULBACTAM 4 SENSITIVE Sensitive     PIP/TAZO <=4 SENSITIVE Sensitive     * >=100,000 COLONIES/mL KLEBSIELLA PNEUMONIAE  Blood Culture (routine x 2)     Status: None (Preliminary result)   Collection Time: 01/31/22 12:16 PM   Specimen:  BLOOD  Result Value Ref Range Status   Specimen Description   Final    BLOOD LEFT ANTECUBITAL Performed at [facility] [address] [place], [place] [address]    Special Requests   Final    BOTTLES DRAWN AEROBIC AND ANAEROBIC Blood Culture results may not be optimal due to an inadequate volume of blood received in culture bottles Performed at [facility] [address] [place], [place] [address]    Culture   Final    NO GROWTH < 24 HOURS Performed at [facility] [address] [place], [place] [address]    Report Status PENDING  Incomplete  Blood Culture (routine x 2)     Status: None (Preliminary result)   Collection Time:  01/31/22 12:16 PM   Specimen: BLOOD  Result Value Ref Range Status   Specimen Description   Final    BLOOD RIGHT ANTECUBITAL Performed at [facility] [address], [place], [place] [address]    Special Requests   Final    BOTTLES DRAWN AEROBIC AND ANAEROBIC Blood Culture results may not be optimal due to an inadequate volume of blood received in culture bottles Performed at [facility] [address], [place], [place] [address]    Culture   Final    NO GROWTH < 24 HOURS Performed at [facility] [address], [place], [place] [address]    Report Status PENDING  Incomplete         Radiology Studies: CT Renal Stone Study  Result Date: 01/31/2022 CLINICAL DATA:  Flank pain.  Dark urine. EXAM: CT ABDOMEN AND PELVIS WITHOUT CONTRAST TECHNIQUE: Multidetector CT imaging of the abdomen and pelvis was performed following the standard protocol without IV contrast. RADIATION DOSE REDUCTION: This exam was performed according to the departmental dose-optimization program which includes automated exposure control, adjustment of the mA and/or kV according to patient size and/or use of iterative reconstruction technique. COMPARISON:  10/23/2021 FINDINGS: Lower chest: Pulmonary nodule is seen in the posterior right middle lobe which is new since previous study, but incompletely visualized. This measures at least 1.7 cm, and is and suspicious for neoplasm. Hepatobiliary: No mass visualized on this unenhanced exam. Gallbladder is unremarkable. No evidence of biliary ductal dilatation. Pancreas: No mass or inflammatory process visualized on this unenhanced exam. Spleen:  Within normal limits in size. Adrenals/Urinary tract: No evidence of urolithiasis or hydronephrosis. Foley catheter is seen within the urinary bladder. Stomach/Bowel: No evidence of obstruction, inflammatory process, or abnormal fluid collections. Vascular/Lymphatic: No pathologically  enlarged lymph nodes identified. No evidence of abdominal aortic aneurysm. Aortic atherosclerotic calcification incidentally noted. Reproductive:  Mildly enlarged prostate. Other: Mild ascites in pelvis and diffuse mesenteric and body wall edema, increased since prior exam. Musculoskeletal:  No suspicious bone lesions identified. IMPRESSION: No evidence of urolithiasis or hydronephrosis. Mildly enlarged prostate. Mild ascites, and diffuse mesenteric and body wall edema. 1.7 cm right middle lobe pulmonary nodule, suspicious for neoplasm. Chest CT with contrast is recommended for further evaluation. Electronically Signed   By: Marlaine Hind M.D.   On: 01/31/2022 14:45   DG Chest Port 1 View  Result Date: 01/31/2022 CLINICAL DATA:  Sepsis. EXAM: PORTABLE CHEST 1 VIEW COMPARISON:  November 18, 2021. FINDINGS: Stable cardiomediastinal silhouette. Stable elevated left hemidiaphragm is noted. Minimal bibasilar subsegmental atelectasis is noted. Bony thorax is unremarkable. IMPRESSION: Stable elevated left hemidiaphragm. Minimal bibasilar subsegmental atelectasis. Electronically Signed   By: Jeneen Rinks  Murlean Caller M.D.   On: 01/31/2022 11:01        Scheduled Meds:  carbidopa-levodopa  1 tablet Oral QHS   carbidopa-levodopa  1 tablet Oral QHS   carbidopa-levodopa  2 tablet Oral BID AC   Chlorhexidine Gluconate Cloth  6 each Topical Daily   fludrocortisone  0.1 mg Oral Daily   sertraline  25 mg Oral Daily   tamsulosin  0.4 mg Oral Daily   traZODone  50 mg Oral QHS   Continuous Infusions:  sodium chloride 100 mL/hr at 02/01/22 0541   cefTRIAXone (ROCEPHIN)  IV            Aline August, MD [facility] 02/01/2022, 11:35 AM

## 2022-02-01 NOTE — TOC Initial Note (Signed)
Transition of Care [facility]) - Initial/Assessment Note    Patient Details  Name: Theodore King MRN: [id number] Date of Birth: [birth year]  Transition of Care [facility]) CM/SW Contact:    Leeroy Cha, RN Phone Number: 02/01/2022, 9:27 AM  Clinical Narrative:                  Transition of Care [facility]) Screening Note   Patient Details  Name: Theodore King Date of Birth: 08-27-1945   Transition of Care [facility]) CM/SW Contact:    Leeroy Cha, RN Phone Number: 02/01/2022, 9:27 AM    Transition of Care Department [facility]) has reviewed patient and no TOC needs have been identified at this time. We will continue to monitor patient advancement through interdisciplinary progression rounds. If new patient transition needs arise, please place a TOC consult.  [facility] patient    Expected Discharge Plan: Home/Self Care Barriers to Discharge: Continued Medical Work up   Patient Goals and CMS Choice Patient states their goals for this hospitalization and ongoing recovery are:: not stated CMS Medicare.gov Compare Post Acute Care list provided to:: Legal Guardian Choice offered to / list presented to : Adult Children  Expected Discharge Plan and Services Expected Discharge Plan: Home/Self Care   Discharge Planning Services: CM Consult   Living arrangements for the past 2 months: Single Family Home                                      Prior Living Arrangements/Services Living arrangements for the past 2 months: Single Family Home Lives with:: Self Patient language and need for interpreter reviewed:: Yes Do you feel safe going back to the place where you live?: Yes      Need for Family Participation in Patient Care: Yes (Comment) (daughter and pace) Care giver support system in place?: Yes (comment) (pace of the triad)   Criminal Activity/Legal Involvement Pertinent to Current Situation/Hospitalization: No - Comment as needed  Activities of Daily  Living Home Assistive Devices/Equipment: Environmental consultant (specify type), Eyeglasses, Cane (specify quad or straight), Wheelchair ADL Screening (condition at time of admission) Patient's cognitive ability adequate to safely complete daily activities?: No Is the patient deaf or have difficulty hearing?: Yes Does the patient have difficulty seeing, even when wearing glasses/contacts?: No Does the patient have difficulty concentrating, remembering, or making decisions?: Yes Patient able to express need for assistance with ADLs?: Yes Does the patient have difficulty dressing or bathing?: Yes Independently performs ADLs?: No Communication: Independent Dressing (OT): Dependent Is this a change from baseline?: Pre-admission baseline Grooming: Dependent Is this a change from baseline?: Pre-admission baseline Feeding: Independent Bathing: Dependent Is this a change from baseline?: Pre-admission baseline Toileting: Dependent Is this a change from baseline?: Pre-admission baseline In/Out Bed: Needs assistance Is this a change from baseline?: Pre-admission baseline Walks in Home: Needs assistance Is this a change from baseline?: Pre-admission baseline Does the patient have difficulty walking or climbing stairs?: Yes Weakness of Legs: Both Weakness of Arms/Hands: Both  Permission Sought/Granted                  Emotional Assessment Appearance:: Appears stated age Attitude/Demeanor/Rapport: Engaged Affect (typically observed): Calm Orientation: : Oriented to Self, Oriented to Place, Oriented to  Time, Oriented to Situation Alcohol / Substance Use: Tobacco Use (hx of tobacco use no current use.) Psych Involvement: No (comment)  Admission diagnosis:  Pulmonary  nodule [R91.1] AKI (acute kidney injury) ([place]) [N17.9] Urinary tract infection associated with indwelling urethral catheter, subsequent encounter [T83.511D, N39.0] Patient Active Problem List   Diagnosis Date Noted   Foley catheter  problem, initial encounter ([place]) 01/31/2022   Pulmonary nodule 01/31/2022   Syncope 11/18/2021   UTI (urinary tract infection) due to Enterococcus and E coli 11/12/2021   Essential hypertension 11/11/2021   E coli bacteremia 11/10/2021   Hypokalemia 11/09/2021   Normocytic anemia 11/09/2021   Benign neoplasm of transverse colon    Abnormal CT of the abdomen    AKI (acute kidney injury) ([place]) 10/23/2021   Thrombocytopenia ([place]) 10/23/2021   BPH (benign prostatic hyperplasia) 10/23/2021   Hyperbilirubinemia 10/23/2021   Colonic mass 10/23/2021   Aortic atherosclerosis ([place]) 10/23/2021   History of 2019 novel coronavirus disease (COVID-19) 04/24/2019   Dysphonia 10/16/2018   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

## 2022-02-02 ENCOUNTER — Telehealth: Payer: Self-pay | Admitting: Emergency Medicine

## 2022-02-02 DIAGNOSIS — T839XXA Unspecified complication of genitourinary prosthetic device, implant and graft, initial encounter: Secondary | ICD-10-CM | POA: Diagnosis not present

## 2022-02-02 DIAGNOSIS — N400 Enlarged prostate without lower urinary tract symptoms: Secondary | ICD-10-CM | POA: Diagnosis not present

## 2022-02-02 DIAGNOSIS — T83511D Infection and inflammatory reaction due to indwelling urethral catheter, subsequent encounter: Secondary | ICD-10-CM | POA: Diagnosis not present

## 2022-02-02 DIAGNOSIS — N179 Acute kidney failure, unspecified: Secondary | ICD-10-CM | POA: Diagnosis not present

## 2022-02-02 DIAGNOSIS — G9341 Metabolic encephalopathy: Secondary | ICD-10-CM

## 2022-02-02 DIAGNOSIS — Z515 Encounter for palliative care: Secondary | ICD-10-CM

## 2022-02-02 DIAGNOSIS — R339 Retention of urine, unspecified: Secondary | ICD-10-CM

## 2022-02-02 DIAGNOSIS — D696 Thrombocytopenia, unspecified: Secondary | ICD-10-CM

## 2022-02-02 DIAGNOSIS — Z789 Other specified health status: Secondary | ICD-10-CM

## 2022-02-02 DIAGNOSIS — N390 Urinary tract infection, site not specified: Secondary | ICD-10-CM

## 2022-02-02 LAB — CBC WITH DIFFERENTIAL/PLATELET
Abs Immature Granulocytes: 0.03 10*3/uL (ref 0.00–0.07)
Basophils Absolute: 0 10*3/uL (ref 0.0–0.1)
Basophils Relative: 0 %
Eosinophils Absolute: 0 10*3/uL (ref 0.0–0.5)
Eosinophils Relative: 0 %
HCT: 25.2 % — ABNORMAL LOW (ref 39.0–52.0)
Hemoglobin: 7.7 g/dL — ABNORMAL LOW (ref 13.0–17.0)
Immature Granulocytes: 0 %
Lymphocytes Relative: 18 %
Lymphs Abs: 1.2 10*3/uL (ref 0.7–4.0)
MCH: 26.3 pg (ref 26.0–34.0)
MCHC: 30.6 g/dL (ref 30.0–36.0)
MCV: 86 fL (ref 80.0–100.0)
Monocytes Absolute: 0.4 10*3/uL (ref 0.1–1.0)
Monocytes Relative: 7 %
Neutro Abs: 5.1 10*3/uL (ref 1.7–7.7)
Neutrophils Relative %: 75 %
Platelets: 162 10*3/uL (ref 150–400)
RBC: 2.93 MIL/uL — ABNORMAL LOW (ref 4.22–5.81)
RDW: 14.9 % (ref 11.5–15.5)
WBC: 6.8 10*3/uL (ref 4.0–10.5)
nRBC: 0 % (ref 0.0–0.2)

## 2022-02-02 LAB — URINE CULTURE: Culture: >=100000 — AB

## 2022-02-02 LAB — COMPREHENSIVE METABOLIC PANEL
ALT: 5 U/L (ref 0–44)
AST: 14 U/L — ABNORMAL LOW (ref 15–41)
Albumin: 2.3 g/dL — ABNORMAL LOW (ref 3.5–5.0)
Alkaline Phosphatase: 49 U/L (ref 38–126)
Anion gap: 3 — ABNORMAL LOW (ref 5–15)
BUN: 30 mg/dL — ABNORMAL HIGH (ref 8–23)
CO2: 23 mmol/L (ref 22–32)
Calcium: 8.2 mg/dL — ABNORMAL LOW (ref 8.9–10.3)
Chloride: 112 mmol/L — ABNORMAL HIGH (ref 98–111)
Creatinine, Ser: 1.44 mg/dL — ABNORMAL HIGH (ref 0.61–1.24)
GFR, Estimated: 50 mL/min — ABNORMAL LOW (ref >60)
Glucose, Bld: 90 mg/dL (ref 70–99)
Potassium: 3.8 mmol/L (ref 3.5–5.1)
Sodium: 138 mmol/L (ref 135–145)
Total Bilirubin: 0.5 mg/dL (ref 0.3–1.2)
Total Protein: 6.2 g/dL — ABNORMAL LOW (ref 6.5–8.1)

## 2022-02-02 LAB — MAGNESIUM: Magnesium: 1.6 mg/dL — ABNORMAL LOW (ref 1.7–2.4)

## 2022-02-02 LAB — TSH: TSH: 1.952 u[IU]/mL (ref 0.350–4.500)

## 2022-02-02 LAB — AMMONIA: Ammonia: 31 umol/L (ref 9–35)

## 2022-02-02 LAB — VITAMIN B12: Vitamin B-12: 423 pg/mL (ref 180–914)

## 2022-02-02 MED ORDER — ENOXAPARIN SODIUM 40 MG/0.4ML IJ SOSY
40.0000 mg | PREFILLED_SYRINGE | INTRAMUSCULAR | Status: DC
  Administered 2022-02-02 – 2022-02-03 (×2): 40 mg via SUBCUTANEOUS
  Filled 2022-02-02 (×2): qty 0.4

## 2022-02-02 MED ORDER — MAGNESIUM SULFATE 2 GM/50ML IV SOLN
2.0000 g | Freq: Once | INTRAVENOUS | Status: AC
  Administered 2022-02-02: 2 g via INTRAVENOUS
  Filled 2022-02-02: qty 50

## 2022-02-02 MED ORDER — HYDRALAZINE HCL 25 MG PO TABS
25.0000 mg | ORAL_TABLET | Freq: Four times a day (QID) | ORAL | Status: DC | PRN
Start: 2022-02-02 — End: 2022-02-03
  Administered 2022-02-02: 25 mg via ORAL
  Filled 2022-02-02: qty 1

## 2022-02-02 MED ORDER — CEPHALEXIN 500 MG PO CAPS
500.0000 mg | ORAL_CAPSULE | Freq: Two times a day (BID) | ORAL | Status: DC
  Administered 2022-02-02 – 2022-02-03 (×3): 500 mg via ORAL
  Filled 2022-02-02 (×3): qty 1

## 2022-02-02 MED ORDER — PNEUMOCOCCAL 20-VAL CONJ VACC 0.5 ML IM SUSY
0.5000 mL | PREFILLED_SYRINGE | INTRAMUSCULAR | Status: AC
  Administered 2022-02-03: 0.5 mL via INTRAMUSCULAR
  Filled 2022-02-02: qty 0.5

## 2022-02-02 NOTE — Evaluation (Signed)
Physical Therapy One Time Evaluation Patient Details Name: Theodore King MRN: [id number] DOB: [birth year] Today's Date: 02/02/2022  History of Present Illness  76 year old male with history of aortic atherosclerosis, BPH, hyperlipidemia, Parkinson's disease, COVID-19 pneumonia, REM sleep behavior disorder, BPPV, urinary retention, chronic Foley catheter use presented with urinary retention after pulling out his Foley catheter at home.  Pt admitted 01/31/22 for AKI.  Clinical Impression  Patient evaluated by Physical Therapy with no further acute PT needs identified. All education has been completed and the patient has no further questions.  Pt assisted with ambulating in hallway and did not require physical assist however did require cues for safety.  Pt likely near mobility baseline and reports he has RW at home.  Recommend supervision at home for safety due to cognitive deficits. See below for any follow-up Physical Therapy or equipment needs. PT is signing off. Thank you for this referral.        Recommendations for follow up therapy are one component of a multi-disciplinary discharge planning process, led by the attending physician.  Recommendations may be updated based on patient status, additional functional criteria and insurance authorization.  Follow Up Recommendations No PT follow up      Assistance Recommended at Discharge Intermittent Supervision/Assistance  Patient can return home with the following  A little help with walking and/or transfers;A little help with bathing/dressing/bathroom;Help with stairs or ramp for entrance    Equipment Recommendations None recommended by PT  Recommendations for Other Services       Functional Status Assessment Patient has not had a recent decline in their functional status     Precautions / Restrictions Precautions Precautions: Fall      Mobility  Bed Mobility               General bed mobility comments: pt in recliner on  arrival    Transfers Overall transfer level: Needs assistance Equipment used: Rolling walker (2 wheels) Transfers: Sit to/from Stand Sit to Stand: Min guard, Supervision           General transfer comment: min/guard for safety, no physical assist required but cues for safety required    Ambulation/Gait Ambulation/Gait assistance: Min guard Gait Distance (Feet): 300 Feet Assistive device: Rolling walker (2 wheels) Gait Pattern/deviations: Step-through pattern, Decreased stride length       General Gait Details: cues for safe use of RW, pt reports feeling well, no symptoms, safety cues with turning  Stairs            Wheelchair Mobility    Modified Rankin (Stroke Patients Only)       Balance Overall balance assessment: Mild deficits observed, not formally tested                                           Pertinent Vitals/Pain Pain Assessment Pain Assessment: PAINAD Breathing: normal Negative Vocalization: none Facial Expression: smiling or inexpressive Body Language: relaxed Consolability: no need to console PAINAD Score: 0 Pain Intervention(s): Repositioned, Monitored during session    Home Living Family/patient expects to be discharged to:: Private residence Living Arrangements: Children Available Help at Discharge: Family;Available PRN/intermittently Type of Home: House         Home Layout: One level Home Equipment: Conservation officer, nature (2 wheels);Cane - single point;Grab bars - tub/shower;BSC/3in1 Additional Comments: daughter works during day. Pt goes to 2-3 days a week [facility] 8  hours/wk. (from previous admission)    Prior Function Prior Level of Function : Independent/Modified Independent;Needs assist             Mobility Comments: SPC at baseline; independent ADLs Comments: Assist with bathing, dressing assist; able to feed self and perform grooming     Hand Dominance        Extremity/Trunk Assessment        Lower  Extremity Assessment Lower Extremity Assessment: Overall WFL for tasks assessed    Cervical / Trunk Assessment Cervical / Trunk Assessment: Normal  Communication   Communication: No difficulties  Cognition Arousal/Alertness: Awake/alert Behavior During Therapy: WFL for tasks assessed/performed Overall Cognitive Status: No family/caregiver present to determine baseline cognitive functioning                                 General Comments: hx parkinson's, question of baseline dementia this admission?, decreased safety awareness        General Comments      Exercises     Assessment/Plan    PT Assessment Patient does not need any further PT services  PT Problem List         PT Treatment Interventions      PT Goals (Current goals can be found in the Care Plan section)  Acute Rehab PT Goals PT Goal Formulation: All assessment and education complete, DC therapy    Frequency       Co-evaluation               AM-PAC PT "6 Clicks" Mobility  Outcome Measure Help needed turning from your back to your side while in a flat bed without using bedrails?: None Help needed moving from lying on your back to sitting on the side of a flat bed without using bedrails?: None Help needed moving to and from a bed to a chair (including a wheelchair)?: A Little Help needed standing up from a chair using your arms (e.g., wheelchair or bedside chair)?: A Little Help needed to walk in hospital room?: A Little Help needed climbing 3-5 steps with a railing? : A Little 6 Click Score: 20    End of Session Equipment Utilized During Treatment: Gait belt Activity Tolerance: Patient tolerated treatment well Patient left: in chair;with call bell/phone within reach;with chair alarm set   PT Visit Diagnosis: Difficulty in walking, not elsewhere classified (R26.2)    Time: 0258-5277 PT Time Calculation (min) (ACUTE ONLY): 14 min   Charges:   PT Evaluation $PT Eval Low  Complexity: 1 Low         Kati PT, DPT Physical Therapist Acute Rehabilitation Services Preferred contact method: Secure Chat Weekend Pager Only: [phone] Office: [place] 02/02/2022, 3:23 PM

## 2022-02-02 NOTE — Consult Note (Signed)
Consultation Note Date: 02/02/2022   Patient Name: Theodore King  DOB: April 27, 1946  MRN: [id number]  Age / Sex: 76 y.o., male  PCP: [name], MD Referring Physician: Aline August, MD  Reason for Consultation: Establishing goals of care  HPI/Patient Profile: 76 y.o. male  with past medical history of aortic atherosclerosis, BPH, HLD, Parkinson's, COVID-19 PNA, REM sleep behavior disorder, BPPV, and urinary retention (chronic Foley catheter) admitted on 01/31/2022 with concerns for AKI since patient had pulled out his Foley catheter at home.  UA was suggestive of UTI and therefore IVF and antibiotics were initiated.  CT revealed mild enlarged prostate with no stones and 1.7 cm right middle lobe pulmonary nodule suspicious for neoplasm (CT of the chest with contrast is recommended for follow-up).   Palliative medicine team was consulted to discuss goals of care.  Clinical Assessment and Goals of Care: I have reviewed medical records including EPIC notes, labs and imaging, assessed the patient - who was unable to appropriately participate in [place] discussions - and then spoke with patient's daughter/HCPOA (on file in [place]) Theodore King over the phone to discuss diagnosis prognosis, [place], EOL wishes, disposition and options.  I introduced Palliative Medicine as specialized medical care for people living with serious illness. It focuses on providing relief from the symptoms and stress of a serious illness. The goal is to improve quality of life for both the patient and the family.  We discussed a brief life review of the patient.  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  Patient has been living with family for the last 5 years.  Patient has a close family friend whom he considers a son that has also been helping Theodore King at home.  Family shares patient has become increasingly difficult to  take care of since she fears he cannot be left alone without accident happening.  Patient has had multiple falls PTA.  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  We discussed patient's current illness and what it means in the larger context of patient's on-going co-morbidities. I attempted to elicit values and goals of care important to the patient.  Theodore King shares she would like to make sure the neck step for her father is 1 that means he is in a safe space.  She is trying to avoid having him placed in a nursing facility but wants to ensure he has the right care to avoid falls and injuries that could be detrimental to his health.  Discussed patient will be evaluated by PT in order to help determine what level of rehab or therapies patient could benefit from.  We discussed patient's CODE STATUS.  In the event of a cardiopulmonary arrest Theodore King shares that she would not want to put her father through CPR, shocks, or use of a mechanical ventilator.  However, she does not want to make any adjustments to his plan  of care right now.  She wants to ensure that he does not suffer through the trauma of a [place] but is not prepared to adjust his CODE STATUS at the moment.  I shared that we will continue these goals of care discussions and decisions on boundaries of care for her father throughout his hospitalization.   Discussed with Theodore King the importance of continued conversation with family and the medical providers regarding overall plan of care and treatment options, ensuring decisions are within the context of the patient's values and GOCs.    Questions and concerns were addressed. Theodore King was encouraged to call with questions or concerns. PMT contact info given.   Primary Decision Maker HCPOA  Code Status/Advance Care Planning: Full code  Prognosis:   Unable to determine  Discharge Planning: To Be Determined  Primary  Diagnoses: Present on Admission:  AKI (acute kidney injury) ([place])  BPH (benign prostatic hyperplasia)  Parkinson's disease ([place])  Normocytic anemia  Essential hypertension  Foley catheter problem, initial encounter ([place])  Thrombocytopenia ([place])   Physical Exam Vitals reviewed.  Constitutional:      General: He is not in acute distress.    Appearance: Normal appearance. He is normal weight. He is not ill-appearing or toxic-appearing.  HENT:     Head: Normocephalic.     Mouth/Throat:     Mouth: Mucous membranes are moist.  Eyes:     Pupils: Pupils are equal, round, and reactive to light.  Cardiovascular:     Rate and Rhythm: Normal rate.     Pulses: Normal pulses.  Pulmonary:     Effort: Pulmonary effort is normal.  Abdominal:     Palpations: Abdomen is soft.  Genitourinary:    Comments: Foley in place Musculoskeletal:     Comments: Generalized weakness  Skin:    General: Skin is warm and dry.  Neurological:     Mental Status: He is alert.     Comments: Oriented to self and place - believes he is in the hospital because of falls but does not recall pulling out his foley  Psychiatric:        Mood and Affect: Mood normal.        Behavior: Behavior normal.     Palliative Assessment/Data: 50%     Thank you for this consult. Palliative medicine will continue to follow and assist holistically.   Time Total: 75 minutes Greater than 50%  of this time was spent counseling and coordinating care related to the above assessment and plan.  Signed by: Jordan Hawks, DNP, FNP-BC Palliative Medicine    Please contact Palliative Medicine Team phone at [phone] for questions and concerns.  For individual provider: See [name]

## 2022-02-02 NOTE — Plan of Care (Signed)

## 2022-02-02 NOTE — Progress Notes (Signed)
Notified on call provider about patient's BP of 169/69 and HR 56. No new orders at this time. Will continue to monitor.

## 2022-02-02 NOTE — Telephone Encounter (Signed)
Post ED Visit - Positive Culture Follow-up  Culture report reviewed by antimicrobial stewardship pharmacist: [facility] Team '[]'$  Elenor Quinones, Pharm.D. '[]'$  Heide Guile, Pharm.D., BCPS AQ-ID '[]'$  Parks Neptune, Pharm.D., BCPS '[]'$  Alycia Rossetti, Pharm.D., BCPS '[]'$  [place], [place].D., BCPS, AAHIVP '[]'$  Legrand Como, Pharm.D., BCPS, AAHIVP '[]'$  Salome Arnt, PharmD, BCPS '[]'$  Johnnette Gourd, PharmD, BCPS '[]'$  Hughes Better, PharmD, BCPS '[]'$  Leeroy Cha, PharmD '[]'$  Laqueta Linden, PharmD, BCPS '[]'$  Albertina Parr, PharmD  [facility] Team '[]'$  Leodis Sias, PharmD '[]'$  Lindell Spar, PharmD '[]'$  Royetta Asal, PharmD '[]'$  Graylin Shiver, Rph '[]'$  Rema Fendt) Glennon Mac, PharmD '[]'$  Arlyn Dunning, PharmD '[]'$  [name], PharmD '[]'$  Dia Sitter, PharmD '[]'$  Leone Haven, PharmD '[]'$  Gretta Arab, PharmD '[]'$  Theodis Shove, PharmD '[]'$  Peggyann Juba, PharmD '[]'$  Reuel Boom, PharmD Jimmy Footman PharmD  Positive urine culture Treated with cephalexin , currently being treated as an inpatient,no further patient follow-up is required at this time.  Theodore King 02/02/2022, 10:13 AM

## 2022-02-02 NOTE — Progress Notes (Signed)
PROGRESS NOTE    Theodore King  XQJ:[id number] DOB: 1946-03-16 DOA: 01/31/2022 PCP: [name], MD   Brief Narrative:  76 year old male with history of aortic atherosclerosis, BPH, hyperlipidemia, Parkinson's disease, COVID-19 pneumonia, REM sleep behavior disorder, BPPV, urinary retention, chronic Foley catheter use presented with urinary retention after pulling out his Foley catheter at home.  On presentation, patient had his catheter replaced.  UA was suggestive of UTI; WBC 12.5, creatinine of 2.45.  He was started on IV fluids and antibiotics.  Chest x-ray showed bibasilar subsegmental atelectasis.  CT renal stone study showed mild enlarged prostate, no stones; 1.7 cm right middle lobe pulmonary nodule suspicious for neoplasm for which CT chest with contrast was recommended.  Assessment & Plan:   Acute kidney injury -Possible UTI and because patient removed his Foley catheter.  Creatinine 2.45 on presentation.  Improving with IV fluids.  Creatinine 1.44 this morning.  Repeat a.m. labs.  Decrease normal saline to 50 cc an hour.    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  Continue Flomax.  Outpatient follow-up with urology  UTI -Continue Rocephin.  Urine cultures from 01/30/2022 grew Klebsiella pneumoniae.  Follow cultures from admission on 4/48/1856  Acute metabolic encephalopathy -Currently slightly confused.  Unclear if the patient has baseline dementia from Parkinson's disease. -Monitor mental status.  Fall precautions.  PT eval. continue empiric folic acid supplementation.  B12, TSH, ammonia levels normal.  Leukocytosis -Resolved  Hypomagnesemia -Replace.  Repeat a.m. labs  Anemia chronic disease -hemoglobin stable.  Monitor intermittently  Thrombocytopenia -Questionable cause.  Resolved  Pulmonary nodule -CT chest with contrast after AKI resolves,  possibly as an outpatient  Parkinson's disease -Continue current regimen of Sinemet.  Outpatient follow-up with neurology  Anxiety/depression -continue sertraline/trazodone  Goals of care -Currently lives with his full code. palliative care consult requested for goals of care discussion.  DVT prophylaxis: Start Lovenox Code Status: Full Family Communication: None at bedside Disposition Plan: Status is: Inpatient Remains inpatient appropriate because: Of severity of illness  Consultants: None  Procedures: None  Antimicrobials: Rocephin from 01/31/2022 onwards   Subjective: Patient seen and examined at bedside.  Still slightly confused to time.  Poor historian.  No seizures, vomiting, fever or agitation reported. Objective: Vitals:   02/02/22 0419 02/02/22 0500 02/02/22 0617 02/02/22 0804  BP: (!) 176/67  (!) 169/69 (!) 171/72  Pulse: (!) 57  (!) 56 (!) 58  Resp: 18   16  Temp: 99.2 F (37.3 C)   98.4 F (36.9 C)  TempSrc: Oral   Oral  SpO2: 98%   100%  Weight:  77.2 kg    Height:        Intake/Output Summary (Last 24 hours) at 02/02/2022 0816 Last data filed at 02/02/2022 0419 Gross per 24 hour  Intake 86.1 ml  Output 1000 ml  Net -913.9 ml    Filed Weights   01/31/22 1244 01/31/22 2325 02/02/22 0500  Weight: 77.1 kg 77.1 kg 77.2 kg    Examination:  General: On room air.  No distress.  Looks chronically ill and deconditioned. ENT/neck: No thyromegaly.  JVD is not elevated  respiratory: Decreased breath sounds at bases bilaterally with some crackles; no wheezing  CVS: S1-S2 heard, bradycardic mildly  abdominal: Soft, nontender, slightly distended; no organomegaly, normal bowel sounds are heard Extremities: Trace lower extremity edema; no cyanosis  CNS: Awake.  Slow to respond.  Slightly confused to time.  No focal neurologic deficit.  Moves extremities Lymph: No obvious lymphadenopathy Skin: No obvious ecchymosis/lesions  psych: Flat affect.  Currently not  agitated.   Musculoskeletal: No obvious joint swelling/deformity Genitourinary: Indwelling Foley catheter present    Data Reviewed: I have personally reviewed following labs and imaging studies  CBC: Recent Labs  Lab 01/30/22 0753 01/31/22 1216 02/01/22 0343 02/02/22 0417  WBC 9.0 12.5* 10.5 6.8  NEUTROABS 7.6 11.6*  --  5.1  HGB 9.5* 8.8* 7.8* 7.7*  HCT 29.6* 28.0* 24.9* 25.2*  MCV 82.7 84.8 85.6 86.0  PLT 198 190 149* 704    Basic Metabolic Panel: Recent Labs  Lab 01/30/22 0753 01/31/22 1216 02/01/22 0343 02/02/22 0417  NA 140 136 143 138  K 4.0 3.9 3.9 3.8  CL 108 106 113* 112*  CO2 '25 22 23 23  '$ GLUCOSE 127* 119* 91 90  BUN 28* 40* 36* 30*  CREATININE 1.25* 2.45* 1.69* 1.44*  CALCIUM 8.9 8.5* 8.6* 8.2*  MG  --   --   --  1.6*    GFR: Estimated Creatinine Clearance: 46.2 mL/min (A) (by C-G formula based on SCr of 1.44 mg/dL (H)). Liver Function Tests: Recent Labs  Lab 01/30/22 0753 01/31/22 1216 02/01/22 0343 02/02/22 0417  AST '28 21 15 '$ 14*  ALT '19 6 6 5  '$ ALKPHOS 70 60 47 49  BILITOT 0.6 2.4* 1.2 0.5  PROT 7.9 7.6 6.1* 6.2*  ALBUMIN 3.0* 3.2* 2.5* 2.3*    Recent Labs  Lab 01/30/22 0753  LIPASE 51    Recent Labs  Lab 02/02/22 0417  AMMONIA 31   Coagulation Profile: Recent Labs  Lab 01/31/22 1216  INR 1.2    Cardiac Enzymes: No results for input(s): "CKTOTAL", "CKMB", "CKMBINDEX", "TROPONINI" in the last 168 hours. BNP (last 3 results) No results for input(s): "PROBNP" in the last 8760 hours. HbA1C: No results for input(s): "HGBA1C" in the last 72 hours. CBG: No results for input(s): "GLUCAP" in the last 168 hours. Lipid Profile: No results for input(s): "CHOL", "HDL", "LDLCALC", "TRIG", "CHOLHDL", "LDLDIRECT" in the last 72 hours. Thyroid Function Tests: Recent Labs    02/02/22 0417  TSH 1.952   Anemia Panel: Recent Labs    02/02/22 0417  VITAMINB12 423   Sepsis Labs: Recent Labs  Lab 01/31/22 1216  LATICACIDVEN  2.5*     Recent Results (from the past 240 hour(s))  Urine Culture     Status: Abnormal   Collection Time: 01/30/22 10:28 AM   Specimen: Urine, Catheterized  Result Value Ref Range Status   Specimen Description   Final    URINE, CATHETERIZED Performed at [facility] [address], [place], [place] [address]    Special Requests   Final    NONE Performed at [name], [place] [address] [place], [place] [address]    Culture >=100,000 COLONIES/mL KLEBSIELLA PNEUMONIAE (A)  Final   Report Status 02/01/2022 FINAL  Final   Organism ID, Bacteria KLEBSIELLA PNEUMONIAE (A)  Final      Susceptibility   Klebsiella pneumoniae - MIC*    AMPICILLIN RESISTANT Resistant     CEFAZOLIN <=4 SENSITIVE Sensitive     CEFEPIME <=0.12 SENSITIVE Sensitive     CEFTRIAXONE <=0.25 SENSITIVE Sensitive     CIPROFLOXACIN <=0.25 SENSITIVE Sensitive     GENTAMICIN <=1 SENSITIVE Sensitive     IMIPENEM <=0.25 SENSITIVE Sensitive     NITROFURANTOIN 64 INTERMEDIATE Intermediate  TRIMETH/SULFA <=20 SENSITIVE Sensitive     AMPICILLIN/SULBACTAM 4 SENSITIVE Sensitive     PIP/TAZO <=4 SENSITIVE Sensitive     * >=100,000 COLONIES/mL KLEBSIELLA PNEUMONIAE  Blood Culture (routine x 2)     Status: None (Preliminary result)   Collection Time: 01/31/22 12:16 PM   Specimen: BLOOD  Result Value Ref Range Status   Specimen Description   Final    BLOOD LEFT ANTECUBITAL Performed at [facility] [address] [place], [place] [address]    Special Requests   Final    BOTTLES DRAWN AEROBIC AND ANAEROBIC Blood Culture results may not be optimal due to an inadequate volume of blood received in culture bottles Performed at [facility] [address], [place], [place] [address]    Culture   Final    NO GROWTH 2 DAYS Performed at [facility] [address] [place], [place] [address]    Report Status PENDING  Incomplete  Blood  Culture (routine x 2)     Status: None (Preliminary result)   Collection Time: 01/31/22 12:16 PM   Specimen: BLOOD  Result Value Ref Range Status   Specimen Description   Final    BLOOD RIGHT ANTECUBITAL Performed at [facility] [address], [place], [place] [address]    Special Requests   Final    BOTTLES DRAWN AEROBIC AND ANAEROBIC Blood Culture results may not be optimal due to an inadequate volume of blood received in culture bottles Performed at [facility] [address], [place], [place] [address]    Culture   Final    NO GROWTH 2 DAYS Performed at [facility] [address] [place], [place] [address]    Report Status PENDING  Incomplete  Urine Culture     Status: Abnormal   Collection Time: 01/31/22 12:16 PM   Specimen: In/Out Cath Urine  Result Value Ref Range Status   Specimen Description   Final    IN/OUT CATH URINE Performed at [facility] [address] [place], [place] [address]    Special Requests   Final    NONE Performed at [facility], [place] [address] [place], [place] [address]    Culture >=100,000 COLONIES/mL KLEBSIELLA PNEUMONIAE (A)  Final   Report Status 02/02/2022 FINAL  Final   Organism ID, Bacteria KLEBSIELLA PNEUMONIAE (A)  Final      Susceptibility   Klebsiella pneumoniae - MIC*    AMPICILLIN >=32 RESISTANT Resistant     CEFAZOLIN <=4 SENSITIVE Sensitive     CEFEPIME <=0.12 SENSITIVE Sensitive     CEFTRIAXONE <=0.25 SENSITIVE Sensitive     CIPROFLOXACIN <=0.25 SENSITIVE Sensitive     GENTAMICIN <=1 SENSITIVE Sensitive     IMIPENEM <=0.25 SENSITIVE Sensitive     NITROFURANTOIN 64 INTERMEDIATE Intermediate     TRIMETH/SULFA <=20 SENSITIVE Sensitive     AMPICILLIN/SULBACTAM 4 SENSITIVE Sensitive     PIP/TAZO <=4 SENSITIVE Sensitive     * >=100,000 COLONIES/mL KLEBSIELLA PNEUMONIAE         Radiology Studies: CT Renal Stone Study  Result Date:  01/31/2022 CLINICAL DATA:  Flank pain.  Dark urine. EXAM: CT ABDOMEN AND PELVIS WITHOUT CONTRAST TECHNIQUE: Multidetector CT imaging of the abdomen and pelvis was performed following the standard protocol without IV contrast. RADIATION DOSE REDUCTION: This exam was performed according to the departmental dose-optimization program which includes automated exposure control, adjustment of the mA and/or kV  according to patient size and/or use of iterative reconstruction technique. COMPARISON:  10/23/2021 FINDINGS: Lower chest: Pulmonary nodule is seen in the posterior right middle lobe which is new since previous study, but incompletely visualized. This measures at least 1.7 cm, and is and suspicious for neoplasm. Hepatobiliary: No mass visualized on this unenhanced exam. Gallbladder is unremarkable. No evidence of biliary ductal dilatation. Pancreas: No mass or inflammatory process visualized on this unenhanced exam. Spleen:  Within normal limits in size. Adrenals/Urinary tract: No evidence of urolithiasis or hydronephrosis. Foley catheter is seen within the urinary bladder. Stomach/Bowel: No evidence of obstruction, inflammatory process, or abnormal fluid collections. Vascular/Lymphatic: No pathologically enlarged lymph nodes identified. No evidence of abdominal aortic aneurysm. Aortic atherosclerotic calcification incidentally noted. Reproductive:  Mildly enlarged prostate. Other: Mild ascites in pelvis and diffuse mesenteric and body wall edema, increased since prior exam. Musculoskeletal:  No suspicious bone lesions identified. IMPRESSION: No evidence of urolithiasis or hydronephrosis. Mildly enlarged prostate. Mild ascites, and diffuse mesenteric and body wall edema. 1.7 cm right middle lobe pulmonary nodule, suspicious for neoplasm. Chest CT with contrast is recommended for further evaluation. Electronically Signed   By: Marlaine Hind M.D.   On: 01/31/2022 14:45   DG Chest Port 1 View  Result Date:  01/31/2022 CLINICAL DATA:  Sepsis. EXAM: PORTABLE CHEST 1 VIEW COMPARISON:  November 18, 2021. FINDINGS: Stable cardiomediastinal silhouette. Stable elevated left hemidiaphragm is noted. Minimal bibasilar subsegmental atelectasis is noted. Bony thorax is unremarkable. IMPRESSION: Stable elevated left hemidiaphragm. Minimal bibasilar subsegmental atelectasis. Electronically Signed   By: Marijo Conception M.D.   On: 01/31/2022 11:01        Scheduled Meds:  carbidopa-levodopa  1 tablet Oral QHS   carbidopa-levodopa  1 tablet Oral QHS   carbidopa-levodopa  2 tablet Oral BID AC   Chlorhexidine Gluconate Cloth  6 each Topical Daily   fludrocortisone  0.1 mg Oral Daily   folic acid  1 mg Oral Daily   sertraline  25 mg Oral Daily   tamsulosin  0.4 mg Oral Daily   traZODone  50 mg Oral QHS   Continuous Infusions:  sodium chloride 100 mL/hr at 02/02/22 0230   cefTRIAXone (ROCEPHIN)  IV 1 g (02/01/22 1734)          Aline August, MD [facility] 02/02/2022, 8:16 AM

## 2022-02-03 ENCOUNTER — Other Ambulatory Visit (HOSPITAL_COMMUNITY): Payer: Self-pay

## 2022-02-03 ENCOUNTER — Encounter (HOSPITAL_COMMUNITY): Payer: Self-pay | Admitting: *Deleted

## 2022-02-03 DIAGNOSIS — N179 Acute kidney failure, unspecified: Secondary | ICD-10-CM | POA: Diagnosis not present

## 2022-02-03 DIAGNOSIS — L8990 Pressure ulcer of unspecified site, unspecified stage: Secondary | ICD-10-CM | POA: Insufficient documentation

## 2022-02-03 LAB — BASIC METABOLIC PANEL
Anion gap: 4 — ABNORMAL LOW (ref 5–15)
BUN: 24 mg/dL — ABNORMAL HIGH (ref 8–23)
CO2: 25 mmol/L (ref 22–32)
Calcium: 8.2 mg/dL — ABNORMAL LOW (ref 8.9–10.3)
Chloride: 110 mmol/L (ref 98–111)
Creatinine, Ser: 1.28 mg/dL — ABNORMAL HIGH (ref 0.61–1.24)
GFR, Estimated: 57 mL/min — ABNORMAL LOW (ref >60)
Glucose, Bld: 87 mg/dL (ref 70–99)
Potassium: 3.9 mmol/L (ref 3.5–5.1)
Sodium: 139 mmol/L (ref 135–145)

## 2022-02-03 LAB — CBC WITH DIFFERENTIAL/PLATELET
Abs Immature Granulocytes: 0.01 10*3/uL (ref 0.00–0.07)
Basophils Absolute: 0 10*3/uL (ref 0.0–0.1)
Basophils Relative: 1 %
Eosinophils Absolute: 0 10*3/uL (ref 0.0–0.5)
Eosinophils Relative: 1 %
HCT: 24.8 % — ABNORMAL LOW (ref 39.0–52.0)
Hemoglobin: 7.7 g/dL — ABNORMAL LOW (ref 13.0–17.0)
Immature Granulocytes: 0 %
Lymphocytes Relative: 26 %
Lymphs Abs: 1.4 10*3/uL (ref 0.7–4.0)
MCH: 26.3 pg (ref 26.0–34.0)
MCHC: 31 g/dL (ref 30.0–36.0)
MCV: 84.6 fL (ref 80.0–100.0)
Monocytes Absolute: 0.4 10*3/uL (ref 0.1–1.0)
Monocytes Relative: 7 %
Neutro Abs: 3.5 10*3/uL (ref 1.7–7.7)
Neutrophils Relative %: 65 %
Platelets: 161 10*3/uL (ref 150–400)
RBC: 2.93 MIL/uL — ABNORMAL LOW (ref 4.22–5.81)
RDW: 14.6 % (ref 11.5–15.5)
WBC: 5.3 10*3/uL (ref 4.0–10.5)
nRBC: 0 % (ref 0.0–0.2)

## 2022-02-03 LAB — MAGNESIUM: Magnesium: 1.8 mg/dL (ref 1.7–2.4)

## 2022-02-03 MED ORDER — CEPHALEXIN 500 MG PO CAPS
500.0000 mg | ORAL_CAPSULE | Freq: Two times a day (BID) | ORAL | 0 refills | Status: AC
  Filled 2022-02-03: qty 14, 7d supply, fill #0

## 2022-02-03 NOTE — Progress Notes (Signed)
Mobility Specialist - Progress Note     02/03/22 1143  Mobility  Activity Ambulated with assistance in hallway  Level of Assistance Standby assist, set-up cues, supervision of patient - no hands on  Assistive Device Front wheel walker  Distance Ambulated (ft) 350 ft  Activity Response Tolerated well  $Mobility charge 1 Mobility   Pt was found in bed and agreeable to mobilize. Had no complaints during ambulation and returned back to bed with all necessities in reach.  Ferd Hibbs Mobility Specialist

## 2022-02-03 NOTE — Discharge Summary (Signed)
Physician Discharge Summary  Theodore King OVZ:[id number] DOB: Nov 10, 1945 DOA: 01/31/2022  PCP: [name], MD  Admit date: 01/31/2022 Discharge date: 02/03/2022  Admitted From: Home Disposition: Home  Recommendations for Outpatient Follow-up:  Follow up with PCP in 1-2 weeks [facility] Please obtain BMP in one week to test renal function May need further follow-up of 1.7 cm right middle lobe pulmonary nodule suspicious for neoplasm outpatient if aligns with patient/family's goals of care Please follow up on the following pending results:  Home Health: RN/aide Equipment/Devices: Foley catheter  Discharge Condition: Stable CODE STATUS: Full code Diet recommendation: Heart healthy diet  History of present illness:  Theodore King is a 76 year old male with past medical history significant for Parkinson's disease, BPH/chronic urinary retention s/p Foley catheter, aortic atherosclerosis, hyperlipidemia, history of COVID-19 pneumonia, REM sleep behavior disorder, BPPV who presented to the ED on 8/27 with urinary retention after pulling out his Foley catheter at home.  In the ED, Foley catheter was replaced.  Urinalysis was suggestive of urinary tract infection with WBC count of 12.5, creatinine elevated 2.45.  Chest x-ray notable for bibasilar subsegmental atelectasis.  CT renal stone study showed mild enlarged prostate, no stones; 1.7 cm right middle lobe pulmonary nodule suspicious for neoplasm.  Patient was started on IV fluids antibiotics.  Hospitalist service consulted for further evaluation management of UTI and acute renal failure.   Hospital course:  Klebsiella pneumonia urinary tract infection Patient presenting with confusion after self discontinuing his home Foley catheter in which he utilizes for chronic urinary retention.  WBC count elevated on admission to 12.1.  Urine culture positive for Klebsiella pneumonia.   Patient was initially started on empiric antibiotics with ceftriaxone and was de-escalated to Keflex following urine culture susceptibilities. Continue Keflex 500 mg p.o. twice daily to complete 41-OIN course for complicated UTI in the setting of chronic Foley catheter use.  Acute renal failure Patient presenting with a creatinine of 2.45.  Baseline 1.0-1.1.  Etiology likely multifactorial in the setting of acute on chronic urinary retention after patient self discontinued Foley catheter at home as well as infection from UTI.  Chronic urinary retention Foley catheter was replaced in the ED.  Continue tamsulosin.  Outpatient follow-up with urology.  Acute metabolic encephalopathy: Resolved. Etiology likely multifactorial in the setting of UTI, urinary obstruction as well as underlying history of Parkinson's disease with possible dementia.  Appears his mentation is now back to his typical baseline.  Outpatient follow-up PCP and neurology.  Pulmonary nodule Incidental finding of 1.7 cm pulmonary nodule right middle lobe suspicious for neoplasm.  Recommend outpatient follow-up PCP with possible referral to pulmonology with CT chest with IV contrast if patient/family wish for continued aggressive goals of care.  Hypomagnesemia Repleted during hospitalization.  Anemia of chronic disease Hemoglobin stable, 7.7 at time of discharge.  Thrombocytopenia Platelets 161 at time of discharge, stable.  Anxiety/depression Continue sertraline, trazodone.  Parkinson's disease Continue Sinemet, outpatient follow-up with neurology.    Discharge Diagnoses:  Principal Problem:   AKI (acute kidney injury) ([place]) Active Problems:   Parkinson's disease ([place])   Thrombocytopenia ([place])   BPH (benign prostatic hyperplasia)   Normocytic anemia   Essential hypertension   UTI (urinary tract infection)   Foley catheter problem, initial encounter [facility])   Pulmonary nodule   Urinary retention   Acute metabolic  encephalopathy   Pressure injury of skin    Discharge Instructions  Discharge Instructions  Call MD for:  difficulty breathing, headache or visual disturbances   Complete by: As directed    Call MD for:  extreme fatigue   Complete by: As directed    Call MD for:  persistant dizziness or light-headedness   Complete by: As directed    Call MD for:  persistant nausea and vomiting   Complete by: As directed    Call MD for:  severe uncontrolled pain   Complete by: As directed    Call MD for:  temperature >100.4   Complete by: As directed    Diet - low sodium heart healthy   Complete by: As directed    Increase activity slowly   Complete by: As directed    No wound care   Complete by: As directed       Allergies as of 02/03/2022   No Known Allergies      Medication List     TAKE these medications    acetaminophen 500 MG tablet Commonly known as: TYLENOL Take 500 mg by mouth every 6 (six) hours as needed for mild pain.   albuterol 108 (90 Base) MCG/ACT inhaler Commonly known as: VENTOLIN HFA Inhale 2 puffs into the lungs every 4 (four) hours as needed for wheezing or shortness of breath.   carbidopa-levodopa 50-200 MG tablet Commonly known as: SINEMET CR Take 1 tablet by mouth at bedtime. What changed: Another medication with the same name was changed. Make sure you understand how and when to take each.   carbidopa-levodopa 25-100 MG tablet Commonly known as: SINEMET IR Take 2 tablets at 7 AM, 2 tablets at 10 AM, 2 tablets at 2 PM and 1 tablet at 6 PM What changed:  how much to take how to take this when to take this additional instructions   cephALEXin 500 MG capsule Commonly known as: KEFLEX Take 1 capsule (500 mg total) by mouth every 12 (twelve) hours for 7 days. What changed: when to take this   fludrocortisone 0.1 MG tablet Commonly known as: FLORINEF Take 0.1 mg by mouth daily.   glycopyrrolate 1 MG tablet Commonly known as: Robinul Take 1  tablet (1 mg total) by mouth 2 (two) times daily.   sertraline 25 MG tablet Commonly known as: ZOLOFT Take 1 tablet (25 mg total) by mouth daily.   tamsulosin 0.4 MG Caps capsule Commonly known as: FLOMAX Take 1 capsule (0.4 mg total) by mouth daily.   traZODone 50 MG tablet Commonly known as: DESYREL Take 50 mg by mouth at bedtime.   Vitamin D3 Ultra Potency 1.25 MG (50000 UT) Tabs Generic drug: Cholecalciferol Take 50,000 Units by mouth once a week.        Follow-up Information     [name], MD. Schedule an appointment as soon as possible for a visit in 1 week(s).   Specialty: Internal Medicine Contact information: [place] [place] [address] [place], [place] Follow up.   Contact information: [place] [address] [phone]         [facility]. Schedule an appointment as soon as possible for a visit.   Contact information: [place] [phone]               No Known Allergies  Consultations: Palliative care   Procedures/Studies: CT Renal Stone Study  Result Date: 01/31/2022 CLINICAL DATA:  Flank pain.  Dark urine. EXAM: CT ABDOMEN AND PELVIS WITHOUT CONTRAST TECHNIQUE: Multidetector CT imaging of the abdomen and pelvis was performed following the standard protocol without IV contrast. RADIATION DOSE REDUCTION: This exam was performed according to the departmental dose-optimization program which includes automated exposure control, adjustment of the mA and/or kV according to patient size and/or use of iterative reconstruction technique. COMPARISON:  10/23/2021 FINDINGS: Lower chest: Pulmonary nodule is seen in the posterior right middle lobe which is new since previous study, but incompletely visualized. This measures at least 1.7 cm, and is and suspicious for neoplasm. Hepatobiliary: No mass visualized  on this unenhanced exam. Gallbladder is unremarkable. No evidence of biliary ductal dilatation. Pancreas: No mass or inflammatory process visualized on this unenhanced exam. Spleen:  Within normal limits in size. Adrenals/Urinary tract: No evidence of urolithiasis or hydronephrosis. Foley catheter is seen within the urinary bladder. Stomach/Bowel: No evidence of obstruction, inflammatory process, or abnormal fluid collections. Vascular/Lymphatic: No pathologically enlarged lymph nodes identified. No evidence of abdominal aortic aneurysm. Aortic atherosclerotic calcification incidentally noted. Reproductive:  Mildly enlarged prostate. Other: Mild ascites in pelvis and diffuse mesenteric and body wall edema, increased since prior exam. Musculoskeletal:  No suspicious bone lesions identified. IMPRESSION: No evidence of urolithiasis or hydronephrosis. Mildly enlarged prostate. Mild ascites, and diffuse mesenteric and body wall edema. 1.7 cm right middle lobe pulmonary nodule, suspicious for neoplasm. Chest CT with contrast is recommended for further evaluation. Electronically Signed   By: Marlaine Hind M.D.   On: 01/31/2022 14:45   DG Chest Port 1 View  Result Date: 01/31/2022 CLINICAL DATA:  Sepsis. EXAM: PORTABLE CHEST 1 VIEW COMPARISON:  November 18, 2021. FINDINGS: Stable cardiomediastinal silhouette. Stable elevated left hemidiaphragm is noted. Minimal bibasilar subsegmental atelectasis is noted. Bony thorax is unremarkable. IMPRESSION: Stable elevated left hemidiaphragm. Minimal bibasilar subsegmental atelectasis. Electronically Signed   By: Marijo Conception M.D.   On: 01/31/2022 11:01     Subjective: Patient seen examined bedside, resting comfortably.  No complaints this morning.  Eating breakfast.  Ready for discharge home.  Updated daughter via telephone.  Denies headache, no dizziness, no chest pain, no shortness of breath, no abdominal pain, no fever/chills/night sweats, no  nausea/vomiting/diarrhea,  Discharge Exam: Vitals:   02/02/22 2047 02/03/22 0451  BP: (!) 180/69 (!) 177/75  Pulse: 65 63  Resp: 18 18  Temp: 98 F (36.7 C) 98 F (36.7 C)  SpO2: 96% 100%   Vitals:   02/02/22 1605 02/02/22 1632 02/02/22 2047 02/03/22 0451  BP: (!) 188/72 (!) 186/72 (!) 180/69 (!) 177/75  Pulse: (!) 59  65 63  Resp: '18  18 18  '$ Temp: 97.9 F (36.6 C)  98 F (36.7 C) 98 F (36.7 C)  TempSrc: Oral  Oral Oral  SpO2: 100%  96% 100%  Weight:      Height:        Physical Exam: GEN: NAD, alert and oriented x 3, elderly/chronically ill in appearance. HEENT: NCAT, PERRL, EOMI, sclera clear, MMM PULM: CTAB w/o wheezes/crackles, normal respiratory effort, on room air CV: RRR w/o M/G/R GI: abd soft, NTND, NABS, no R/G/M GU: Foley catheter noted MSK: Trace lower extremity edema, extremities independently NEURO: CN II-XII intact, no focal deficits, sensation to light touch intact PSYCH: normal mood/affect Integumentary: dry/intact, no rashes or wounds    The results of significant diagnostics from this hospitalization (including imaging, microbiology, ancillary and laboratory) are listed below for reference.     Microbiology: Recent Results (from  the past 240 hour(s))  Urine Culture     Status: Abnormal   Collection Time: 01/30/22 10:28 AM   Specimen: Urine, Catheterized  Result Value Ref Range Status   Specimen Description   Final    URINE, CATHETERIZED Performed at [facility] [address], [place], [place] [address]    Special Requests   Final    NONE Performed at [facility], [place] [address] [place], [place] [address]    Culture >=100,000 COLONIES/mL KLEBSIELLA PNEUMONIAE (A)  Final   Report Status 02/01/2022 FINAL  Final   Organism ID, Bacteria KLEBSIELLA PNEUMONIAE (A)  Final      Susceptibility   Klebsiella pneumoniae - MIC*    AMPICILLIN RESISTANT Resistant     CEFAZOLIN <=4 SENSITIVE Sensitive      CEFEPIME <=0.12 SENSITIVE Sensitive     CEFTRIAXONE <=0.25 SENSITIVE Sensitive     CIPROFLOXACIN <=0.25 SENSITIVE Sensitive     GENTAMICIN <=1 SENSITIVE Sensitive     IMIPENEM <=0.25 SENSITIVE Sensitive     NITROFURANTOIN 64 INTERMEDIATE Intermediate     TRIMETH/SULFA <=20 SENSITIVE Sensitive     AMPICILLIN/SULBACTAM 4 SENSITIVE Sensitive     PIP/TAZO <=4 SENSITIVE Sensitive     * >=100,000 COLONIES/mL KLEBSIELLA PNEUMONIAE  Blood Culture (routine x 2)     Status: None (Preliminary result)   Collection Time: 01/31/22 12:16 PM   Specimen: BLOOD  Result Value Ref Range Status   Specimen Description   Final    BLOOD LEFT ANTECUBITAL Performed at [facility] [address] [place], [place] [address]    Special Requests   Final    BOTTLES DRAWN AEROBIC AND ANAEROBIC Blood Culture results may not be optimal due to an inadequate volume of blood received in culture bottles Performed at [facility] [address] [place], [place] [address]    Culture   Final    NO GROWTH 3 DAYS Performed at [facility], [place] [address] [place], [place] [address]    Report Status PENDING  Incomplete  Blood Culture (routine x 2)     Status: None (Preliminary result)   Collection Time: 01/31/22 12:16 PM   Specimen: BLOOD  Result Value Ref Range Status   Specimen Description   Final    BLOOD RIGHT ANTECUBITAL Performed at [facility] [address], [place], [place] [address]    Special Requests   Final    BOTTLES DRAWN AEROBIC AND ANAEROBIC Blood Culture results may not be optimal due to an inadequate volume of blood received in culture bottles Performed at [facility] [address], [place], [place] [address]    Culture   Final    NO GROWTH 3 DAYS Performed at [facility], [place] [address] [place], [place] [address]    Report Status PENDING  Incomplete  Urine Culture     Status: Abnormal   Collection  Time: 01/31/22 12:16 PM   Specimen: In/Out Cath Urine  Result Value Ref Range Status   Specimen Description   Final    IN/OUT CATH URINE Performed at [facility] [address], [place], [place] [address]    Special Requests   Final    NONE Performed at [facility], [place] [address], [place], [place] [address]    Culture >=100,000 COLONIES/mL KLEBSIELLA PNEUMONIAE (A)  Final   Report Status 02/02/2022 FINAL  Final   Organism ID, Bacteria  KLEBSIELLA PNEUMONIAE (A)  Final      Susceptibility   Klebsiella pneumoniae - MIC*    AMPICILLIN >=32 RESISTANT Resistant     CEFAZOLIN <=4 SENSITIVE Sensitive     CEFEPIME <=0.12 SENSITIVE Sensitive     CEFTRIAXONE <=0.25 SENSITIVE Sensitive     CIPROFLOXACIN <=0.25 SENSITIVE Sensitive     GENTAMICIN <=1 SENSITIVE Sensitive     IMIPENEM <=0.25 SENSITIVE Sensitive     NITROFURANTOIN 64 INTERMEDIATE Intermediate     TRIMETH/SULFA <=20 SENSITIVE Sensitive     AMPICILLIN/SULBACTAM 4 SENSITIVE Sensitive     PIP/TAZO <=4 SENSITIVE Sensitive     * >=100,000 COLONIES/mL KLEBSIELLA PNEUMONIAE     Labs: BNP (last 3 results) Recent Labs    05/27/21 2326  BNP 61.9   Basic Metabolic Panel: Recent Labs  Lab 01/30/22 0753 01/31/22 1216 02/01/22 0343 02/02/22 0417 02/03/22 0412  NA 140 136 143 138 139  K 4.0 3.9 3.9 3.8 3.9  CL 108 106 113* 112* 110  CO2 '25 22 23 23 25  '$ GLUCOSE 127* 119* 91 90 87  BUN 28* 40* 36* 30* 24*  CREATININE 1.25* 2.45* 1.69* 1.44* 1.28*  CALCIUM 8.9 8.5* 8.6* 8.2* 8.2*  MG  --   --   --  1.6* 1.8   Liver Function Tests: Recent Labs  Lab 01/30/22 0753 01/31/22 1216 02/01/22 0343 02/02/22 0417  AST '28 21 15 '$ 14*  ALT '19 6 6 5  '$ ALKPHOS 70 60 47 49  BILITOT 0.6 2.4* 1.2 0.5  PROT 7.9 7.6 6.1* 6.2*  ALBUMIN 3.0* 3.2* 2.5* 2.3*   Recent Labs  Lab 01/30/22 0753  LIPASE 51   Recent Labs  Lab 02/02/22 0417  AMMONIA 31   CBC: Recent Labs  Lab 01/30/22 0753  01/31/22 1216 02/01/22 0343 02/02/22 0417 02/03/22 0412  WBC 9.0 12.5* 10.5 6.8 5.3  NEUTROABS 7.6 11.6*  --  5.1 3.5  HGB 9.5* 8.8* 7.8* 7.7* 7.7*  HCT 29.6* 28.0* 24.9* 25.2* 24.8*  MCV 82.7 84.8 85.6 86.0 84.6  PLT 198 190 149* 162 161   Cardiac Enzymes: No results for input(s): "CKTOTAL", "CKMB", "CKMBINDEX", "TROPONINI" in the last 168 hours. BNP: Invalid input(s): "POCBNP" CBG: No results for input(s): "GLUCAP" in the last 168 hours. D-Dimer No results for input(s): "DDIMER" in the last 72 hours. Hgb A1c No results for input(s): "HGBA1C" in the last 72 hours. Lipid Profile No results for input(s): "CHOL", "HDL", "LDLCALC", "TRIG", "CHOLHDL", "LDLDIRECT" in the last 72 hours. Thyroid function studies Recent Labs    02/02/22 0417  TSH 1.952   Anemia work up Recent Labs    02/02/22 0417  VITAMINB12 423   Urinalysis    Component Value Date/Time   COLORURINE YELLOW 01/31/2022 1216   APPEARANCEUR CLOUDY (A) 01/31/2022 1216   LABSPEC 1.012 01/31/2022 1216   PHURINE 6.0 01/31/2022 1216   GLUCOSEU NEGATIVE 01/31/2022 1216   HGBUR MODERATE (A) 01/31/2022 1216   BILIRUBINUR NEGATIVE 01/31/2022 1216   KETONESUR NEGATIVE 01/31/2022 1216   PROTEINUR 30 (A) 01/31/2022 1216   UROBILINOGEN 1.0 04/16/2020 2048   NITRITE NEGATIVE 01/31/2022 1216   LEUKOCYTESUR LARGE (A) 01/31/2022 1216   Sepsis Labs Recent Labs  Lab 01/31/22 1216 02/01/22 0343 02/02/22 0417 02/03/22 0412  WBC 12.5* 10.5 6.8 5.3   Microbiology Recent Results (from the past 240 hour(s))  Urine Culture     Status: Abnormal   Collection Time: 01/30/22 10:28 AM   Specimen: Urine, Catheterized  Result Value Ref Range  Status   Specimen Description   Final    URINE, CATHETERIZED Performed at [facility], [place] [address], [place], [place] [address]    Special Requests   Final    NONE Performed at [facility], [place] [address], [place], [place] [address]     Culture >=100,000 COLONIES/mL KLEBSIELLA PNEUMONIAE (A)  Final   Report Status 02/01/2022 FINAL  Final   Organism ID, Bacteria KLEBSIELLA PNEUMONIAE (A)  Final      Susceptibility   Klebsiella pneumoniae - MIC*    AMPICILLIN RESISTANT Resistant     CEFAZOLIN <=4 SENSITIVE Sensitive     CEFEPIME <=0.12 SENSITIVE Sensitive     CEFTRIAXONE <=0.25 SENSITIVE Sensitive     CIPROFLOXACIN <=0.25 SENSITIVE Sensitive     GENTAMICIN <=1 SENSITIVE Sensitive     IMIPENEM <=0.25 SENSITIVE Sensitive     NITROFURANTOIN 64 INTERMEDIATE Intermediate     TRIMETH/SULFA <=20 SENSITIVE Sensitive     AMPICILLIN/SULBACTAM 4 SENSITIVE Sensitive     PIP/TAZO <=4 SENSITIVE Sensitive     * >=100,000 COLONIES/mL KLEBSIELLA PNEUMONIAE  Blood Culture (routine x 2)     Status: None (Preliminary result)   Collection Time: 01/31/22 12:16 PM   Specimen: BLOOD  Result Value Ref Range Status   Specimen Description   Final    BLOOD LEFT ANTECUBITAL Performed at [facility] [address] [place], [place] [address]    Special Requests   Final    BOTTLES DRAWN AEROBIC AND ANAEROBIC Blood Culture results may not be optimal due to an inadequate volume of blood received in culture bottles Performed at [facility] [address], [place], [place] [address]    Culture   Final    NO GROWTH 3 DAYS Performed at [facility], [place] [address] [place], [place] [address]    Report Status PENDING  Incomplete  Blood Culture (routine x 2)     Status: None (Preliminary result)   Collection Time: 01/31/22 12:16 PM   Specimen: BLOOD  Result Value Ref Range Status   Specimen Description   Final    BLOOD RIGHT ANTECUBITAL Performed at [facility] [address], [place], [place] [address]    Special Requests   Final    BOTTLES DRAWN AEROBIC AND ANAEROBIC Blood Culture results may not be optimal due to an inadequate volume of blood received in culture  bottles Performed at [facility] [address] [place], [place] [address]    Culture   Final    NO GROWTH 3 DAYS Performed at [facility], [place] [address], [place], [place] [address]    Report Status PENDING  Incomplete  Urine Culture     Status: Abnormal   Collection Time: 01/31/22 12:16 PM   Specimen: In/Out Cath Urine  Result Value Ref Range Status   Specimen Description   Final    IN/OUT CATH URINE Performed at [facility] [address], [place], [place] [address]    Special Requests   Final    NONE Performed at [facility], [place] [address], [place], [place] [address]    Culture >=100,000 COLONIES/mL KLEBSIELLA PNEUMONIAE (A)  Final   Report Status 02/02/2022 FINAL  Final   Organism ID, Bacteria KLEBSIELLA PNEUMONIAE (A)  Final      Susceptibility   Klebsiella pneumoniae - MIC*    AMPICILLIN >=32 RESISTANT Resistant     CEFAZOLIN <=4  SENSITIVE Sensitive     CEFEPIME <=0.12 SENSITIVE Sensitive     CEFTRIAXONE <=0.25 SENSITIVE Sensitive     CIPROFLOXACIN <=0.25 SENSITIVE Sensitive     GENTAMICIN <=1 SENSITIVE Sensitive     IMIPENEM <=0.25 SENSITIVE Sensitive     NITROFURANTOIN 64 INTERMEDIATE Intermediate     TRIMETH/SULFA <=20 SENSITIVE Sensitive     AMPICILLIN/SULBACTAM 4 SENSITIVE Sensitive     PIP/TAZO <=4 SENSITIVE Sensitive     * >=100,000 COLONIES/mL KLEBSIELLA PNEUMONIAE     Time coordinating discharge: Over 30 minutes  SIGNED:   Finley Dinkel J [place] DO  [facility] 02/03/2022, 11:11 AM

## 2022-02-03 NOTE — Plan of Care (Signed)
PIVs removed. [name], pt's daughter contacted r/t DC. Pt p/u by [facility]    Problem: Education: Goal: Knowledge of General Education information will improve Description: Including pain rating scale, medication(s)/side effects and non-pharmacologic comfort measures Outcome: Progressing   Problem: Health Behavior/Discharge Planning: Goal: Ability to manage health-related needs will improve Outcome: Progressing   Problem: Clinical Measurements: Goal: Ability to maintain clinical measurements within normal limits will improve Outcome: Progressing Goal: Will remain free from infection Outcome: Progressing Goal: Diagnostic test results will improve Outcome: Progressing Goal: Respiratory complications will improve Outcome: Progressing Goal: Cardiovascular complication will be avoided Outcome: Progressing   Problem: Activity: Goal: Risk for activity intolerance will decrease Outcome: Progressing   Problem: Nutrition: Goal: Adequate nutrition will be maintained Outcome: Progressing   Problem: Coping: Goal: Level of anxiety will decrease Outcome: Progressing   Problem: Elimination: Goal: Will not experience complications related to bowel motility Outcome: Progressing Goal: Will not experience complications related to urinary retention Outcome: Progressing   Problem: Pain Managment: Goal: General experience of comfort will improve Outcome: Progressing   Problem: Safety: Goal: Ability to remain free from injury will improve Outcome: Progressing   Problem: Skin Integrity: Goal: Risk for impaired skin integrity will decrease Outcome: Progressing

## 2022-02-03 NOTE — TOC Progression Note (Signed)
Transition of Care [facility]) - Progression Note    Patient Details  Name: Theodore King MRN: [id number] Date of Birth: 12-26-1945  Transition of [facility]) CM/SW Contact  Leeroy Cha, RN Phone Number: 02/03/2022, 11:27 AM  Clinical Narrative:    [facility] -has foley pace Lucianne Lei will pick up pt between 1-2pm today.  Has aide and rn at home through [facility].     Expected Discharge Plan: Home/Self Care Barriers to Discharge: Continued Medical Work up  Expected Discharge Plan and Services Expected Discharge Plan: Home/Self Care   Discharge Planning Services: CM Consult   Living arrangements for the past 2 months: Single Family Home Expected Discharge Date: 02/03/22                                     Social Determinants of Health (SDOH) Interventions    Readmission Risk Interventions   No data to display

## 2022-02-03 NOTE — TOC Transition Note (Signed)
Transition of Care [facility]) - CM/SW Discharge Note   Patient Details  Name: Theodore King MRN: [id number] Date of Birth: 01/21/1946  Transition of Care [facility]) CM/SW Contact:  Leeroy Cha, RN Phone Number: 02/03/2022, 11:38 AM   Clinical Narrative:    Patient dcd to return home is a pace patient.  Per tracey at [facility] has hhc with a aide and rn in place.  Transport will be through [employer] .  They will bring his qwheelchair and get him from the room and transport him home.  Legal guardian notified of dc.   Final next level of care: [place] Barriers to Discharge: Barriers Resolved   Patient Goals and CMS Choice Patient states their goals for this hospitalization and ongoing recovery are:: not stated CMS Medicare.gov Compare Post Acute Care list provided to:: Legal Guardian Choice offered to / list presented to : NA  Discharge Placement                       Discharge Plan and Services   Discharge Planning Services: CM Consult Post Acute Care Choice: Home Health                    HH Arranged: RN, Nurse's Aide [facility] Agency: Other - See comment (pace provider has in place at home) Date [place]: 02/03/22 Time [place]: 1137 Representative spoke with at [place]: tracey  Social Determinants of Health ([place]) Interventions     Readmission Risk Interventions   No data to display

## 2022-02-05 LAB — CULTURE, BLOOD (ROUTINE X 2)
Culture: NO GROWTH
Culture: NO GROWTH

## 2022-02-27 ENCOUNTER — Other Ambulatory Visit: Payer: Self-pay

## 2022-02-27 ENCOUNTER — Emergency Department (HOSPITAL_COMMUNITY): Payer: Medicare (Managed Care)

## 2022-02-27 ENCOUNTER — Emergency Department (HOSPITAL_COMMUNITY)
Admission: EM | Admit: 2022-02-27 | Discharge: 2022-02-27 | Disposition: A | Discharge status: Home / Self Care | Payer: Medicare (Managed Care) | Attending: Emergency Medicine | Admitting: Emergency Medicine

## 2022-02-27 DIAGNOSIS — Y92129 Unspecified place in nursing home as the place of occurrence of the external cause: Secondary | ICD-10-CM | POA: Diagnosis not present

## 2022-02-27 DIAGNOSIS — W01198A Fall on same level from slipping, tripping and stumbling with subsequent striking against other object, initial encounter: Secondary | ICD-10-CM | POA: Diagnosis not present

## 2022-02-27 DIAGNOSIS — F0390 Unspecified dementia without behavioral disturbance: Secondary | ICD-10-CM | POA: Diagnosis not present

## 2022-02-27 DIAGNOSIS — R7309 Other abnormal glucose: Secondary | ICD-10-CM | POA: Insufficient documentation

## 2022-02-27 DIAGNOSIS — S01411A Laceration without foreign body of right cheek and temporomandibular area, initial encounter: Secondary | ICD-10-CM | POA: Insufficient documentation

## 2022-02-27 DIAGNOSIS — G20 Parkinson's disease: Secondary | ICD-10-CM | POA: Diagnosis not present

## 2022-02-27 DIAGNOSIS — W19XXXA Unspecified fall, initial encounter: Secondary | ICD-10-CM

## 2022-02-27 LAB — CBG MONITORING, ED: Glucose-Capillary: 118 mg/dL — ABNORMAL HIGH (ref 70–99)

## 2022-02-27 MED ORDER — LIDOCAINE-EPINEPHRINE (PF) 2 %-1:200000 IJ SOLN
10.0000 mL | Freq: Once | INTRAMUSCULAR | Status: AC
  Administered 2022-02-27: 10 mL
  Filled 2022-02-27: qty 20

## 2022-02-27 MED ORDER — HYDRALAZINE HCL 25 MG PO TABS
25.0000 mg | ORAL_TABLET | Freq: Once | ORAL | Status: AC
  Administered 2022-02-27: 25 mg via ORAL
  Filled 2022-02-27: qty 1

## 2022-02-27 NOTE — ED Notes (Signed)
Ptar called 

## 2022-02-27 NOTE — ED Provider Notes (Signed)
  Procedures  .Marland KitchenLaceration Repair  Date/Time: 02/27/2022 6:28 PM  Performed by: Suzy Bouchard, PA-C Authorized by: Suzy Bouchard, PA-C   Consent:    Consent obtained:  Verbal   Consent given by:  Patient   Risks discussed:  Infection, need for additional repair, pain, poor cosmetic result and poor wound healing   Alternatives discussed:  No treatment and delayed treatment Universal protocol:    Procedure explained and questions answered to patient or proxy's satisfaction: yes     Relevant documents present and verified: yes     Test results available: yes     Imaging studies available: yes     Required blood products, implants, devices, and special equipment available: yes     Site/side marked: yes     Immediately prior to procedure, a time out was called: yes     Patient identity confirmed:  Verbally with patient Anesthesia:    Anesthesia method:  Local infiltration   Local anesthetic:  Lidocaine 2% WITH epi Laceration details:    Location:  Face   Face location:  Forehead   Length (cm):  3   Depth (mm):  3 Pre-procedure details:    Preparation:  Patient was prepped and draped in usual sterile fashion and imaging obtained to evaluate for foreign bodies Exploration:    Limited defect created (wound extended): no     Hemostasis achieved with:  Direct pressure   Imaging outcome: foreign body not noted     Wound exploration: wound explored through full range of motion and entire depth of wound visualized     Wound extent: no foreign bodies/material noted and no underlying fracture noted     Contaminated: no   Treatment:    Area cleansed with:  Saline   Amount of cleaning:  Standard   Irrigation solution:  Sterile saline   Irrigation volume:  50   Irrigation method:  Syringe   Visualized foreign bodies/material removed: no     Debridement:  None   Undermining:  None   Scar revision: no   Skin repair:    Repair method:  Sutures   Suture size:  5-0   Suture  material:  Prolene   Suture technique:  Simple interrupted   Number of sutures:  4 Approximation:    Approximation:  Close Repair type:    Repair type:  Simple Post-procedure details:    Dressing:  Non-adherent dressing   Procedure completion:  Tolerated well, no immediate complications       Suzy Bouchard, PA-C 02/27/22 1831    Gareth Morgan, MD 03/01/22 2232

## 2022-02-27 NOTE — ED Notes (Signed)
PA at bedside performing suture repair.

## 2022-02-27 NOTE — ED Triage Notes (Signed)
Pt BIBGEMS for fall. - thinners, + head strike. Pt and staff at nursing home said pt's knees buckled from under him and he struck his head, unsure if it was on the floor or on the corner of dresser. Per EMS there is a 2-4 inch laceration to the right temporal side of the head. Pt is acting at baseline but does have a hx of parkinson's disease and dementia. Pt is alert and in NAD, speaking in full sentences.

## 2022-02-27 NOTE — ED Provider Notes (Signed)
[facility] EMERGENCY DEPARTMENT Provider Note   CSN: [id number] Arrival date & time: 02/27/22  1510     History {Add pertinent medical, surgical, social history, OB history to HPI:1} Chief Complaint  Patient presents with   Theodore King is a 76 y.o. male.  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  Patient staff at the nursing home said that his knees buckled under him and he struck his head.  Home Medications Prior to Admission medications   Medication Sig Start Date End Date Taking? Authorizing Provider  acetaminophen (TYLENOL) 500 MG tablet Take 500 mg by mouth every 6 (six) hours as needed for mild pain.    [provider]  albuterol (VENTOLIN HFA) 108 (90 Base) MCG/ACT inhaler Inhale 2 puffs into the lungs every 4 (four) hours as needed for wheezing or shortness of breath.  10/03/18   [provider]  carbidopa-levodopa (SINEMET CR) 50-200 MG tablet Take 1 tablet by mouth at bedtime. 09/24/21   Penumalli, Earlean Polka, MD  carbidopa-levodopa (SINEMET IR) 25-100 MG tablet Take 2 tablets at 7 AM, 2 tablets at 10 AM, 2 tablets at 2 PM and 1 tablet at 6 PM Patient taking differently: Take 1-2 tablets by mouth See admin instructions. Take 2 tablets by mouth every morning and afternoon, take 1 tablet at bedtime 09/24/21   Penumalli, Earlean Polka, MD  Cholecalciferol (VITAMIN D3 ULTRA POTENCY) 1.25 MG (50000 UT) TABS Take 50,000 Units by mouth once a week.    [provider]  fludrocortisone (FLORINEF) 0.1 MG tablet Take 0.1 mg by mouth daily.    [provider]  glycopyrrolate (ROBINUL) 1 MG tablet Take 1 tablet (1 mg total) by mouth 2 (two) times daily. 09/24/21   Penumalli, Earlean Polka, MD  sertraline (ZOLOFT) 25 MG tablet Take 1 tablet (25 mg total) by mouth daily. 09/24/21   Penumalli, Earlean Polka, MD  tamsulosin (FLOMAX) 0.4 MG CAPS capsule Take 1 capsule (0.4 mg total) by mouth daily. 04/16/20   Vanessa Kick, MD  traZODone (DESYREL) 50 MG tablet Take 50 mg by mouth at bedtime. 08/17/20   [provider]      Allergies    Patient has no known allergies.    Review of Systems   Review of Systems  Physical Exam Updated Vital Signs BP (!) 181/72 (BP Location: Right Arm)   Pulse (!) 59   Temp 98.8 F (37.1 C) (Oral)   Resp 18   SpO2 100%  Physical Exam  ED Results / Procedures / Treatments   Labs (all labs ordered are listed, but only abnormal results are displayed) Labs Reviewed  CBG MONITORING, ED - Abnormal; Notable for the following components:      Result Value   Glucose-Capillary 118 (*)    All other components within normal limits    EKG None  Radiology No results found.  Procedures Procedures  {Document cardiac monitor, telemetry assessment procedure when appropriate:1}  Medications Ordered in ED Medications - No data to display  ED Course/ Medical Decision Making/ A&P                           Medical Decision Making Amount and/or Complexity  of Data Reviewed Radiology: ordered.  Risk Prescription drug management.   ***  {Document critical care time when appropriate:1} {Document review of labs and clinical decision tools ie heart score, Chads2Vasc2 etc:1}  {Document your independent review of radiology images, and any outside records:1} {Document your discussion with family members, caretakers, and with consultants:1} {Document social determinants of health affecting pt's care:1} {Document your decision making why or why not admission, treatments were needed:1} Final Clinical Impression(s) / ED Diagnoses Final diagnoses:  None    Rx / DC Orders ED Discharge Orders     None

## 2022-03-12 ENCOUNTER — Encounter (HOSPITAL_COMMUNITY): Payer: Self-pay

## 2022-03-12 ENCOUNTER — Emergency Department (HOSPITAL_COMMUNITY)
Admission: EM | Admit: 2022-03-12 | Discharge: 2022-03-13 | Disposition: A | Discharge status: Home / Self Care | Payer: Medicare (Managed Care) | Attending: Emergency Medicine | Admitting: Emergency Medicine

## 2022-03-12 ENCOUNTER — Other Ambulatory Visit: Payer: Self-pay

## 2022-03-12 DIAGNOSIS — R82998 Other abnormal findings in urine: Secondary | ICD-10-CM | POA: Diagnosis present

## 2022-03-12 DIAGNOSIS — G20A1 Parkinson's disease without dyskinesia, without mention of fluctuations: Secondary | ICD-10-CM | POA: Diagnosis not present

## 2022-03-12 DIAGNOSIS — F0390 Unspecified dementia without behavioral disturbance: Secondary | ICD-10-CM | POA: Insufficient documentation

## 2022-03-12 DIAGNOSIS — N3000 Acute cystitis without hematuria: Secondary | ICD-10-CM | POA: Insufficient documentation

## 2022-03-12 LAB — CBC WITH DIFFERENTIAL/PLATELET
Abs Immature Granulocytes: 0.01 10*3/uL (ref 0.00–0.07)
Basophils Absolute: 0 10*3/uL (ref 0.0–0.1)
Basophils Relative: 0 %
Eosinophils Absolute: 0 10*3/uL (ref 0.0–0.5)
Eosinophils Relative: 1 %
HCT: 34.7 % — ABNORMAL LOW (ref 39.0–52.0)
Hemoglobin: 10.9 g/dL — ABNORMAL LOW (ref 13.0–17.0)
Immature Granulocytes: 0 %
Lymphocytes Relative: 18 %
Lymphs Abs: 1.3 10*3/uL (ref 0.7–4.0)
MCH: 26.7 pg (ref 26.0–34.0)
MCHC: 31.4 g/dL (ref 30.0–36.0)
MCV: 84.8 fL (ref 80.0–100.0)
Monocytes Absolute: 0.3 10*3/uL (ref 0.1–1.0)
Monocytes Relative: 4 %
Neutro Abs: 5.6 10*3/uL (ref 1.7–7.7)
Neutrophils Relative %: 77 %
Platelets: 160 10*3/uL (ref 150–400)
RBC: 4.09 MIL/uL — ABNORMAL LOW (ref 4.22–5.81)
RDW: 14.4 % (ref 11.5–15.5)
WBC: 7.3 10*3/uL (ref 4.0–10.5)
nRBC: 0 % (ref 0.0–0.2)

## 2022-03-12 LAB — URINALYSIS, ROUTINE W REFLEX MICROSCOPIC
Bilirubin Urine: NEGATIVE
Glucose, UA: NEGATIVE mg/dL
Ketones, ur: NEGATIVE mg/dL
Nitrite: NEGATIVE
Protein, ur: 100 mg/dL — AB
Specific Gravity, Urine: 1.015 (ref 1.005–1.030)
WBC, UA: >50 WBC/hpf — ABNORMAL HIGH (ref 0–5)
pH: 8 (ref 5.0–8.0)

## 2022-03-12 LAB — BASIC METABOLIC PANEL
Anion gap: 7 (ref 5–15)
BUN: 27 mg/dL — ABNORMAL HIGH (ref 8–23)
CO2: 25 mmol/L (ref 22–32)
Calcium: 9.1 mg/dL (ref 8.9–10.3)
Chloride: 107 mmol/L (ref 98–111)
Creatinine, Ser: 1.35 mg/dL — ABNORMAL HIGH (ref 0.61–1.24)
GFR, Estimated: 54 mL/min — ABNORMAL LOW (ref >60)
Glucose, Bld: 99 mg/dL (ref 70–99)
Potassium: 3.8 mmol/L (ref 3.5–5.1)
Sodium: 139 mmol/L (ref 135–145)

## 2022-03-12 MED ORDER — SODIUM CHLORIDE 0.9 % IV SOLN
1.0000 g | Freq: Once | INTRAVENOUS | Status: AC
  Administered 2022-03-12: 1 g via INTRAVENOUS
  Filled 2022-03-12: qty 10

## 2022-03-12 MED ORDER — PHENAZOPYRIDINE HCL 200 MG PO TABS
200.0000 mg | ORAL_TABLET | Freq: Once | ORAL | Status: AC
  Administered 2022-03-12: 200 mg via ORAL
  Filled 2022-03-12: qty 1

## 2022-03-12 MED ORDER — ACETAMINOPHEN 500 MG PO TABS
1000.0000 mg | ORAL_TABLET | Freq: Once | ORAL | Status: AC
  Administered 2022-03-12: 1000 mg via ORAL
  Filled 2022-03-12: qty 2

## 2022-03-12 MED ORDER — SODIUM CHLORIDE 0.9 % IV BOLUS
500.0000 mL | Freq: Once | INTRAVENOUS | Status: AC
  Administered 2022-03-12: 500 mL via INTRAVENOUS

## 2022-03-12 MED ORDER — CEPHALEXIN 500 MG PO CAPS: 500.0000 mg | ORAL_CAPSULE | Freq: Four times a day (QID) | ORAL | 0 refills | Status: DC

## 2022-03-12 NOTE — ED Triage Notes (Addendum)
Per EMS- patient's daughter reports that she changed the patient's foley cath yesterday and today she noted cloudy urine. Patient also c/o burning at the foley cath insertion site.Patient has a history of dementia. Patient is wheelchair bound. Patient's daughter unsure if the patient has been taking his BP meds.

## 2022-03-12 NOTE — ED Provider Notes (Signed)
[facility] DEPT Provider Note   CSN: [id number] Arrival date & time: 03/12/22  1813     History  Chief Complaint  Patient presents with   foley cath issues    Theodore King is a 76 y.o. male.  Pt is a 76 yo male with a pmhx significant for parkinson's disease, dementia, hld, bph with chronic urinary retention and chronic foley.  Pt's daughter changed pt's foley yesterday and noticed that the urine was cloudy.  Pt complains of pain to the insertion site.  No fever.       Home Medications Prior to Admission medications   Medication Sig Start Date End Date Taking? Authorizing Provider  cephALEXin (KEFLEX) 500 MG capsule Take 1 capsule (500 mg total) by mouth 4 (four) times daily. 03/12/22  Yes Isla Pence, MD  acetaminophen (TYLENOL) 500 MG tablet Take 500 mg by mouth every 6 (six) hours as needed for mild pain.    [provider]  albuterol (VENTOLIN HFA) 108 (90 Base) MCG/ACT inhaler Inhale 2 puffs into the lungs every 4 (four) hours as needed for wheezing or shortness of breath.  10/03/18   [provider]  carbidopa-levodopa (SINEMET CR) 50-200 MG tablet Take 1 tablet by mouth at bedtime. 09/24/21   Penumalli, Earlean Polka, MD  carbidopa-levodopa (SINEMET IR) 25-100 MG tablet Take 2 tablets at 7 AM, 2 tablets at 10 AM, 2 tablets at 2 PM and 1 tablet at 6 PM Patient taking differently: Take 1-2 tablets by mouth See admin instructions. Take 2 tablets by mouth every morning and afternoon, take 1 tablet at bedtime 09/24/21   Penumalli, Earlean Polka, MD  Cholecalciferol (VITAMIN D3 ULTRA POTENCY) 1.25 MG (50000 UT) TABS Take 50,000 Units by mouth once a week.    [provider]  fludrocortisone (FLORINEF) 0.1 MG tablet Take 0.1 mg by mouth daily.    [provider]  glycopyrrolate (ROBINUL) 1 MG tablet Take 1 tablet (1 mg total) by mouth 2 (two) times daily. 09/24/21   Penumalli, Earlean Polka, MD  sertraline (ZOLOFT) 25 MG  tablet Take 1 tablet (25 mg total) by mouth daily. 09/24/21   Penumalli, Earlean Polka, MD  tamsulosin (FLOMAX) 0.4 MG CAPS capsule Take 1 capsule (0.4 mg total) by mouth daily. 04/16/20   Vanessa Kick, MD  traZODone (DESYREL) 50 MG tablet Take 50 mg by mouth at bedtime. 08/17/20   [provider]      Allergies    Patient has no known allergies.    Review of Systems   Review of Systems  Genitourinary:  Positive for dysuria.  All other systems reviewed and are negative.   Physical Exam Updated Vital Signs BP (!) 175/94   Pulse 90   Temp 98.4 F (36.9 C)   Resp (!) 30   SpO2 99%  Physical Exam Vitals and nursing note reviewed.  Constitutional:      Appearance: Normal appearance.  HENT:     Head: Normocephalic and atraumatic.     Right Ear: External ear normal.     Left Ear: External ear normal.     Nose: Nose normal.     Mouth/Throat:     Mouth: Mucous membranes are dry.  Eyes:     Extraocular Movements: Extraocular movements intact.     Conjunctiva/sclera: Conjunctivae normal.     Pupils: Pupils are equal, round, and reactive to light.  Cardiovascular:     Rate and Rhythm: Normal rate and regular rhythm.  Pulses: Normal pulses.     Heart sounds: Normal heart sounds.  Pulmonary:     Effort: Pulmonary effort is normal.     Breath sounds: Normal breath sounds.  Abdominal:     General: Abdomen is flat. Bowel sounds are normal.     Palpations: Abdomen is soft.  Genitourinary:    Comments: Foley noted with chronic urethral erosion Musculoskeletal:        General: Normal range of motion.     Cervical back: Normal range of motion and neck supple.  Skin:    General: Skin is warm.     Capillary Refill: Capillary refill takes less than 2 seconds.  Neurological:     General: No focal deficit present.     Mental Status: He is alert and oriented to person, place, and time.  Psychiatric:        Mood and Affect: Mood normal.        Behavior: Behavior normal.      ED Results / Procedures / Treatments   Labs (all labs ordered are listed, but only abnormal results are displayed) Labs Reviewed  URINALYSIS, ROUTINE W REFLEX MICROSCOPIC - Abnormal; Notable for the following components:      Result Value   APPearance CLOUDY (*)    Hgb urine dipstick SMALL (*)    Protein, ur 100 (*)    Leukocytes,Ua MODERATE (*)    WBC, UA >50 (*)    Bacteria, UA RARE (*)    All other components within normal limits  CBC WITH DIFFERENTIAL/PLATELET - Abnormal; Notable for the following components:   RBC 4.09 (*)    Hemoglobin 10.9 (*)    HCT 34.7 (*)    All other components within normal limits  BASIC METABOLIC PANEL - Abnormal; Notable for the following components:   BUN 27 (*)    Creatinine, Ser 1.35 (*)    GFR, Estimated 54 (*)    All other components within normal limits  URINE CULTURE    EKG None  Radiology No results found.  Procedures Procedures    Medications Ordered in ED Medications  cefTRIAXone (ROCEPHIN) 1 g in sodium chloride 0.9 % 100 mL IVPB (has no administration in time range)  phenazopyridine (PYRIDIUM) tablet 200 mg (200 mg Oral Given 03/12/22 2015)  acetaminophen (TYLENOL) tablet 1,000 mg (1,000 mg Oral Given 03/12/22 2147)  sodium chloride 0.9 % bolus 500 mL (500 mLs Intravenous New Bag/Given 03/12/22 2147)    ED Course/ Medical Decision Making/ A&P                           Medical Decision Making Amount and/or Complexity of Data Reviewed Labs: ordered.  Risk OTC drugs. Prescription drug management.   This patient presents to the ED for concern of cloudy urine, this involves an extensive number of treatment options, and is a complaint that carries with it a high risk of complications and morbidity.  The differential diagnosis includes uti, aki   Co morbidities that complicate the patient evaluation  parkinson's disease, dementia, hld, bph with chronic urinary retention and chronic foley   Additional history  obtained:  Additional history obtained from epic chart review    Lab Tests:  I Ordered, and personally interpreted labs.  The pertinent results include:  cbc with hgb 10.9 (hgb 7.7 on 8/30); bmp with cr 1.35 (cr 1.28 on 8/30); ua + for uti    Cardiac Monitoring:  The patient was maintained on  a cardiac monitor.  I personally viewed and interpreted the cardiac monitored which showed an underlying rhythm of: nsr   Medicines ordered and prescription drug management:  I ordered medication including rocephin  for uti  Reevaluation of the patient after these medicines showed that the patient improved I have reviewed the patients home medicines and have made adjustments as needed   Problem List / ED Course:  UTI:  pt started on rocephin.  His last culture was sensitive for cephalosporins, so he is d/c with keflex.  I have tried calling his daughter several times, but messages go to VM.  Nurse working on [employer] of her.  Pt does not need admission.  He is stable for d/c. Return if worse.  F/u with urology.     Reevaluation:  After the interventions noted above, I reevaluated the patient and found that they have :improved   Social Determinants of Health:  Lives at home with daughter   Dispostion:  After consideration of the diagnostic results and the patients response to treatment, I feel that the patent would benefit from discharge with outpatient f/u.          Final Clinical Impression(s) / ED Diagnoses Final diagnoses:  Acute cystitis without hematuria    Rx / DC Orders ED Discharge Orders          Ordered    cephALEXin (KEFLEX) 500 MG capsule  4 times daily        03/12/22 2226              Isla Pence, MD 03/12/22 2232

## 2022-03-13 NOTE — ED Notes (Signed)
[facility] contacted for transport to residence.

## 2022-03-15 LAB — URINE CULTURE: Culture: >=100000 — AB

## 2022-03-16 ENCOUNTER — Telehealth (HOSPITAL_BASED_OUTPATIENT_CLINIC_OR_DEPARTMENT_OTHER): Payer: Self-pay | Admitting: Emergency Medicine

## 2022-03-16 NOTE — Progress Notes (Signed)
ED Antimicrobial Stewardship Positive Culture Follow Up   Theodore King is an 76 y.o. male who presented to [facility] on 03/12/2022 with a chief complaint of  Chief Complaint  Patient presents with   foley cath issues    Recent Results (from the past 720 hour(s))  Urine Culture     Status: Abnormal   Collection Time: 03/12/22  6:43 PM   Specimen: Urine, Catheterized  Result Value Ref Range Status   Specimen Description   Final    URINE, CATHETERIZED Performed at [facility], [place] [address] [place], [place] [address]    Special Requests   Final    NONE Performed at [facility], [place] [address], [place], [place] [address]    Culture >=100,000 COLONIES/mL ENTEROBACTER CLOACAE (A)  Final   Report Status 03/15/2022 FINAL  Final   Organism ID, Bacteria ENTEROBACTER CLOACAE (A)  Final      Susceptibility   Enterobacter cloacae - MIC*    CEFAZOLIN >=64 RESISTANT Resistant     CEFEPIME <=0.12 SENSITIVE Sensitive     CIPROFLOXACIN <=0.25 SENSITIVE Sensitive     GENTAMICIN <=1 SENSITIVE Sensitive     IMIPENEM 0.5 SENSITIVE Sensitive     NITROFURANTOIN 64 INTERMEDIATE Intermediate     TRIMETH/SULFA <=20 SENSITIVE Sensitive     PIP/TAZO <=4 SENSITIVE Sensitive     * >=100,000 COLONIES/mL ENTEROBACTER CLOACAE    '[x]'$  Treated with Keflex, organism resistant to prescribed antimicrobial  New antibiotic prescription: Bactrim-DS 1 tab PO BID x5 days   ED Provider: Octaviano Glow, MD   Francena Hanly, PharmD Pharmacy Resident  03/16/2022 8:41 AM

## 2022-03-16 NOTE — Telephone Encounter (Signed)
Post ED Visit - Positive Culture Follow-up: Successful Patient Follow-Up  Culture assessed and recommendations reviewed by:  '[]'$  Elenor Quinones, Pharm.D. '[]'$  Heide Guile, Pharm.D., BCPS AQ-ID '[]'$  Parks Neptune, Pharm.D., BCPS '[]'$  Alycia Rossetti, Pharm.D., BCPS '[]'$  [place], Pharm.D., BCPS, AAHIVP '[]'$  Legrand Como, Pharm.D., BCPS, AAHIVP '[]'$  Salome Arnt, PharmD, BCPS '[]'$  Johnnette Gourd, PharmD, BCPS '[]'$  Hughes Better, PharmD, BCPS '[]'$  Leeroy Cha, PharmD Francena Hanly PharmD  Positive urine culture  '[]'$  Patient discharged without antimicrobial prescription and treatment is now indicated '[x]'$  Organism is resistant to prescribed ED discharge antimicrobial '[]'$  Patient with positive blood cultures  Changes discussed with ED provider: Dr Octaviano Glow New antibiotic prescription : stop keflex , start Bactrim DS 1 tab po bid x 5 days Spoke with daughter who states PCP changed antibiotics on office visit yesterday   Hazle Nordmann 03/16/2022, 9:20 AM

## 2022-03-29 ENCOUNTER — Telehealth: Payer: Medicaid Other | Admitting: Diagnostic Neuroimaging

## 2022-04-04 ENCOUNTER — Emergency Department (HOSPITAL_COMMUNITY): Payer: Medicare (Managed Care)

## 2022-04-04 ENCOUNTER — Inpatient Hospital Stay (HOSPITAL_COMMUNITY)
Admission: EM | Admit: 2022-04-04 | Discharge: 2022-04-11 | DRG: 698 | Disposition: A | Discharge status: Home Health | Payer: Medicare (Managed Care) | Attending: Internal Medicine | Admitting: Internal Medicine

## 2022-04-04 ENCOUNTER — Encounter (HOSPITAL_COMMUNITY): Payer: Self-pay

## 2022-04-04 ENCOUNTER — Other Ambulatory Visit: Payer: Self-pay

## 2022-04-04 DIAGNOSIS — N390 Urinary tract infection, site not specified: Secondary | ICD-10-CM | POA: Diagnosis present

## 2022-04-04 DIAGNOSIS — F02C0 Dementia in other diseases classified elsewhere, severe, without behavioral disturbance, psychotic disturbance, mood disturbance, and anxiety: Secondary | ICD-10-CM | POA: Diagnosis present

## 2022-04-04 DIAGNOSIS — T83091A Other mechanical complication of indwelling urethral catheter, initial encounter: Principal | ICD-10-CM | POA: Diagnosis present

## 2022-04-04 DIAGNOSIS — R6521 Severe sepsis with septic shock: Secondary | ICD-10-CM | POA: Diagnosis not present

## 2022-04-04 DIAGNOSIS — Z79899 Other long term (current) drug therapy: Secondary | ICD-10-CM | POA: Diagnosis not present

## 2022-04-04 DIAGNOSIS — E872 Acidosis, unspecified: Secondary | ICD-10-CM | POA: Diagnosis present

## 2022-04-04 DIAGNOSIS — R7881 Bacteremia: Secondary | ICD-10-CM | POA: Diagnosis not present

## 2022-04-04 DIAGNOSIS — A419 Sepsis, unspecified organism: Secondary | ICD-10-CM

## 2022-04-04 DIAGNOSIS — E785 Hyperlipidemia, unspecified: Secondary | ICD-10-CM | POA: Diagnosis present

## 2022-04-04 DIAGNOSIS — Z87891 Personal history of nicotine dependence: Secondary | ICD-10-CM | POA: Diagnosis not present

## 2022-04-04 DIAGNOSIS — N401 Enlarged prostate with lower urinary tract symptoms: Secondary | ICD-10-CM | POA: Diagnosis present

## 2022-04-04 DIAGNOSIS — R6520 Severe sepsis without septic shock: Secondary | ICD-10-CM

## 2022-04-04 DIAGNOSIS — Z8616 Personal history of COVID-19: Secondary | ICD-10-CM

## 2022-04-04 DIAGNOSIS — A4150 Gram-negative sepsis, unspecified: Secondary | ICD-10-CM | POA: Diagnosis present

## 2022-04-04 DIAGNOSIS — T83511A Infection and inflammatory reaction due to indwelling urethral catheter, initial encounter: Secondary | ICD-10-CM | POA: Diagnosis present

## 2022-04-04 DIAGNOSIS — R338 Other retention of urine: Secondary | ICD-10-CM | POA: Diagnosis present

## 2022-04-04 DIAGNOSIS — G20A1 Parkinson's disease without dyskinesia, without mention of fluctuations: Secondary | ICD-10-CM | POA: Diagnosis present

## 2022-04-04 DIAGNOSIS — Y846 Urinary catheterization as the cause of abnormal reaction of the patient, or of later complication, without mention of misadventure at the time of the procedure: Secondary | ICD-10-CM | POA: Diagnosis present

## 2022-04-04 DIAGNOSIS — Z87440 Personal history of urinary (tract) infections: Secondary | ICD-10-CM

## 2022-04-04 DIAGNOSIS — D696 Thrombocytopenia, unspecified: Secondary | ICD-10-CM | POA: Diagnosis present

## 2022-04-04 DIAGNOSIS — F02A0 Dementia in other diseases classified elsewhere, mild, without behavioral disturbance, psychotic disturbance, mood disturbance, and anxiety: Secondary | ICD-10-CM

## 2022-04-04 DIAGNOSIS — N179 Acute kidney failure, unspecified: Secondary | ICD-10-CM

## 2022-04-04 DIAGNOSIS — Z978 Presence of other specified devices: Secondary | ICD-10-CM

## 2022-04-04 DIAGNOSIS — D509 Iron deficiency anemia, unspecified: Secondary | ICD-10-CM | POA: Diagnosis present

## 2022-04-04 DIAGNOSIS — I129 Hypertensive chronic kidney disease with stage 1 through stage 4 chronic kidney disease, or unspecified chronic kidney disease: Secondary | ICD-10-CM | POA: Diagnosis present

## 2022-04-04 DIAGNOSIS — Z1152 Encounter for screening for COVID-19: Secondary | ICD-10-CM | POA: Diagnosis not present

## 2022-04-04 DIAGNOSIS — N1832 Chronic kidney disease, stage 3b: Secondary | ICD-10-CM | POA: Diagnosis present

## 2022-04-04 DIAGNOSIS — R339 Retention of urine, unspecified: Principal | ICD-10-CM

## 2022-04-04 DIAGNOSIS — Z9359 Other cystostomy status: Secondary | ICD-10-CM

## 2022-04-04 DIAGNOSIS — R7989 Other specified abnormal findings of blood chemistry: Secondary | ICD-10-CM

## 2022-04-04 DIAGNOSIS — N3001 Acute cystitis with hematuria: Secondary | ICD-10-CM | POA: Diagnosis not present

## 2022-04-04 DIAGNOSIS — T83511D Infection and inflammatory reaction due to indwelling urethral catheter, subsequent encounter: Secondary | ICD-10-CM | POA: Diagnosis not present

## 2022-04-04 LAB — CBC WITH DIFFERENTIAL/PLATELET
Abs Immature Granulocytes: 2.04 10*3/uL — ABNORMAL HIGH (ref 0.00–0.07)
Basophils Absolute: 0 10*3/uL (ref 0.0–0.1)
Basophils Relative: 0 %
Eosinophils Absolute: 0 10*3/uL (ref 0.0–0.5)
Eosinophils Relative: 0 %
HCT: 26.4 % — ABNORMAL LOW (ref 39.0–52.0)
Hemoglobin: 8.5 g/dL — ABNORMAL LOW (ref 13.0–17.0)
Immature Granulocytes: 10 %
Lymphocytes Relative: 3 %
Lymphs Abs: 0.7 10*3/uL (ref 0.7–4.0)
MCH: 25.7 pg — ABNORMAL LOW (ref 26.0–34.0)
MCHC: 32.2 g/dL (ref 30.0–36.0)
MCV: 79.8 fL — ABNORMAL LOW (ref 80.0–100.0)
Monocytes Absolute: 0.6 10*3/uL (ref 0.1–1.0)
Monocytes Relative: 3 %
Neutro Abs: 18 10*3/uL — ABNORMAL HIGH (ref 1.7–7.7)
Neutrophils Relative %: 84 %
Platelets: 148 10*3/uL — ABNORMAL LOW (ref 150–400)
RBC: 3.31 MIL/uL — ABNORMAL LOW (ref 4.22–5.81)
RDW: 14.7 % (ref 11.5–15.5)
WBC: 21.3 10*3/uL — ABNORMAL HIGH (ref 4.0–10.5)
nRBC: 0 % (ref 0.0–0.2)

## 2022-04-04 LAB — URINALYSIS, ROUTINE W REFLEX MICROSCOPIC

## 2022-04-04 LAB — LACTIC ACID, PLASMA
Lactic Acid, Venous: 2.4 mmol/L (ref 0.5–1.9)
Lactic Acid, Venous: 2.2 mmol/L (ref 0.5–1.9)

## 2022-04-04 LAB — BASIC METABOLIC PANEL
Anion gap: 9 (ref 5–15)
BUN: 64 mg/dL — ABNORMAL HIGH (ref 8–23)
CO2: 19 mmol/L — ABNORMAL LOW (ref 22–32)
Calcium: 7.7 mg/dL — ABNORMAL LOW (ref 8.9–10.3)
Chloride: 111 mmol/L (ref 98–111)
Creatinine, Ser: 4.76 mg/dL — ABNORMAL HIGH (ref 0.61–1.24)
GFR, Estimated: 12 mL/min — ABNORMAL LOW (ref >60)
Glucose, Bld: 139 mg/dL — ABNORMAL HIGH (ref 70–99)
Potassium: 4.5 mmol/L (ref 3.5–5.1)
Sodium: 139 mmol/L (ref 135–145)

## 2022-04-04 LAB — TROPONIN I (HIGH SENSITIVITY)
Troponin I (High Sensitivity): 194 ng/L (ref <18)
Troponin I (High Sensitivity): 197 ng/L (ref <18)

## 2022-04-04 LAB — APTT: aPTT: 43 seconds — ABNORMAL HIGH (ref 24–36)

## 2022-04-04 LAB — URINALYSIS, MICROSCOPIC (REFLEX): WBC, UA: >50 WBC/hpf (ref 0–5)

## 2022-04-04 LAB — PROTIME-INR
INR: 1.3 — ABNORMAL HIGH (ref 0.8–1.2)
Prothrombin Time: 15.6 seconds — ABNORMAL HIGH (ref 11.4–15.2)

## 2022-04-04 LAB — RESP PANEL BY RT-PCR (FLU A&B, COVID) ARPGX2
Influenza A by PCR: NEGATIVE
Influenza B by PCR: NEGATIVE
SARS Coronavirus 2 by RT PCR: NEGATIVE

## 2022-04-04 MED ORDER — SODIUM CHLORIDE 0.9 % IV SOLN
2.0000 g | INTRAVENOUS | Status: DC
  Administered 2022-04-05 – 2022-04-06 (×2): 2 g via INTRAVENOUS
  Filled 2022-04-04 (×3): qty 12.5

## 2022-04-04 MED ORDER — CARBIDOPA-LEVODOPA ER 50-200 MG PO TBCR
1.0000 | EXTENDED_RELEASE_TABLET | Freq: Every day | ORAL | Status: DC
  Administered 2022-04-04 – 2022-04-10 (×7): 1 via ORAL
  Filled 2022-04-04 (×7): qty 1

## 2022-04-04 MED ORDER — ONDANSETRON HCL 4 MG PO TABS: 4.0000 mg | ORAL_TABLET | Freq: Four times a day (QID) | ORAL | Status: DC | PRN

## 2022-04-04 MED ORDER — SERTRALINE HCL 25 MG PO TABS
25.0000 mg | ORAL_TABLET | Freq: Every day | ORAL | Status: DC
  Administered 2022-04-05 – 2022-04-11 (×7): 25 mg via ORAL
  Filled 2022-04-04 (×9): qty 1

## 2022-04-04 MED ORDER — POLYETHYLENE GLYCOL 3350 17 G PO PACK
17.0000 g | PACK | Freq: Every day | ORAL | Status: DC | PRN
  Administered 2022-04-09: 17 g via ORAL
  Filled 2022-04-04: qty 1

## 2022-04-04 MED ORDER — ACETAMINOPHEN 325 MG PO TABS: 650.0000 mg | ORAL_TABLET | Freq: Four times a day (QID) | ORAL | Status: DC | PRN

## 2022-04-04 MED ORDER — BISACODYL 10 MG RE SUPP: 10.0000 mg | Freq: Every day | RECTAL | Status: DC | PRN

## 2022-04-04 MED ORDER — HEPARIN SODIUM (PORCINE) 5000 UNIT/ML IJ SOLN
5000.0000 [IU] | Freq: Three times a day (TID) | INTRAMUSCULAR | Status: DC
  Administered 2022-04-04 – 2022-04-11 (×20): 5000 [IU] via SUBCUTANEOUS
  Filled 2022-04-04 (×20): qty 1

## 2022-04-04 MED ORDER — ONDANSETRON HCL 4 MG/2ML IJ SOLN: 4.0000 mg | Freq: Four times a day (QID) | INTRAMUSCULAR | Status: DC | PRN

## 2022-04-04 MED ORDER — LACTATED RINGERS IV BOLUS (SEPSIS)
1000.0000 mL | Freq: Once | INTRAVENOUS | Status: AC
  Administered 2022-04-04: 1000 mL via INTRAVENOUS

## 2022-04-04 MED ORDER — GLYCOPYRROLATE 1 MG PO TABS
1.0000 mg | ORAL_TABLET | Freq: Two times a day (BID) | ORAL | Status: DC
  Administered 2022-04-04 – 2022-04-11 (×14): 1 mg via ORAL
  Filled 2022-04-04 (×15): qty 1

## 2022-04-04 MED ORDER — CARBIDOPA-LEVODOPA 25-100 MG PO TABS
1.0000 | ORAL_TABLET | ORAL | Status: DC
  Administered 2022-04-05 – 2022-04-10 (×6): 1 via ORAL
  Filled 2022-04-04 (×8): qty 1

## 2022-04-04 MED ORDER — LACTATED RINGERS IV SOLN: INTRAVENOUS | Status: DC

## 2022-04-04 MED ORDER — SODIUM CHLORIDE 0.9 % IV SOLN
2.0000 g | Freq: Once | INTRAVENOUS | Status: AC
  Administered 2022-04-04: 2 g via INTRAVENOUS
  Filled 2022-04-04: qty 12.5

## 2022-04-04 MED ORDER — CARBIDOPA-LEVODOPA 25-100 MG PO TABS
2.0000 | ORAL_TABLET | ORAL | Status: DC
  Administered 2022-04-05 – 2022-04-11 (×13): 2 via ORAL
  Filled 2022-04-04 (×13): qty 2

## 2022-04-04 MED ORDER — LACTATED RINGERS IV BOLUS (SEPSIS)
500.0000 mL | Freq: Once | INTRAVENOUS | Status: AC
  Administered 2022-04-04: 500 mL via INTRAVENOUS

## 2022-04-04 MED ORDER — FLUDROCORTISONE ACETATE 0.1 MG PO TABS
0.1000 mg | ORAL_TABLET | Freq: Every day | ORAL | Status: DC
  Administered 2022-04-05 – 2022-04-11 (×7): 0.1 mg via ORAL
  Filled 2022-04-04 (×8): qty 1

## 2022-04-04 MED ORDER — SODIUM CHLORIDE 0.9 % IV SOLN: 2.0000 g | INTRAVENOUS | Status: DC

## 2022-04-04 MED ORDER — CARBIDOPA-LEVODOPA 25-100 MG PO TABS: 1.0000 | ORAL_TABLET | ORAL | Status: DC

## 2022-04-04 MED ORDER — ACETAMINOPHEN 650 MG RE SUPP: 650.0000 mg | Freq: Four times a day (QID) | RECTAL | Status: DC | PRN

## 2022-04-04 NOTE — Progress Notes (Signed)
A consult was received from an ED physician for Cefepime per pharmacy dosing.  The patient's profile has been reviewed for ht/wt/allergies/indication/available labs.    A one time order has been placed for Cefepime 2g IV.  Further antibiotics/pharmacy consults should be ordered by admitting physician if indicated.                       Thank you, Luiz Ochoa 04/04/2022  1:43 PM

## 2022-04-04 NOTE — ED Triage Notes (Signed)
Per EMS- Patient is from home.patient has urine leaking from the insertion site. Patient was given antibiotics last week for a UTI. Patient  has a foul urine odor. Patient is non verbal.

## 2022-04-04 NOTE — ED Provider Notes (Signed)
[facility] DEPT Provider Note   CSN: [id number] Arrival date & time: 04/04/22  1249     History  Chief Complaint  Patient presents with   foley cath issue    Theodore King is a 76 y.o. male.  HPI   Patient has history of Parkinson's disease, paralysis agitans, hyperlipidemia, acute kidney injury, thrombocytopenia, urinary retention who presents to the ED for evaluation of urine leaking around his Foley catheter.  According to EMS report patient is nonverbal at baseline.  Patient's Foley catheter was draining abnormal foul urine.  He was recently diagnosed with a UTI.  Home Medications Prior to Admission medications   Medication Sig Start Date End Date Taking? Authorizing Provider  acetaminophen (TYLENOL) 500 MG tablet Take 500 mg by mouth every 6 (six) hours as needed for mild pain.    [provider]  albuterol (VENTOLIN HFA) 108 (90 Base) MCG/ACT inhaler Inhale 2 puffs into the lungs every 4 (four) hours as needed for wheezing or shortness of breath.  10/03/18   [provider]  carbidopa-levodopa (SINEMET CR) 50-200 MG tablet Take 1 tablet by mouth at bedtime. 09/24/21   Penumalli, Earlean Polka, MD  carbidopa-levodopa (SINEMET IR) 25-100 MG tablet Take 2 tablets at 7 AM, 2 tablets at 10 AM, 2 tablets at 2 PM and 1 tablet at 6 PM Patient taking differently: Take 1-2 tablets by mouth See admin instructions. Take 2 tablets by mouth every morning and afternoon, take 1 tablet at bedtime 09/24/21   Penumalli, Earlean Polka, MD  cephALEXin (KEFLEX) 500 MG capsule Take 1 capsule (500 mg total) by mouth 4 (four) times daily. 03/12/22   Isla Pence, MD  Cholecalciferol (VITAMIN D3 ULTRA POTENCY) 1.25 MG (50000 UT) TABS Take 50,000 Units by mouth once a week.    [provider]  fludrocortisone (FLORINEF) 0.1 MG tablet Take 0.1 mg by mouth daily.    [provider]  glycopyrrolate (ROBINUL) 1 MG tablet Take 1 tablet (1 mg total)  by mouth 2 (two) times daily. 09/24/21   Penumalli, Earlean Polka, MD  sertraline (ZOLOFT) 25 MG tablet Take 1 tablet (25 mg total) by mouth daily. 09/24/21   Penumalli, Earlean Polka, MD  tamsulosin (FLOMAX) 0.4 MG CAPS capsule Take 1 capsule (0.4 mg total) by mouth daily. 04/16/20   Vanessa Kick, MD  traZODone (DESYREL) 50 MG tablet Take 50 mg by mouth at bedtime. 08/17/20   [provider]      Allergies    Patient has no known allergies.    Review of Systems   Review of Systems  Physical Exam Updated Vital Signs BP 114/70   Pulse 88   Temp (!) 97.5 F (36.4 C) (Axillary)   Resp 18   Ht 1.854 m ('6\' 1"'$ )   Wt 77.2 kg   SpO2 99%   BMI 22.45 kg/m  Physical Exam Vitals and nursing note reviewed.  Constitutional:      Appearance: He is well-developed. He is not diaphoretic.     Comments: Appears somnolent  HENT:     Head: Normocephalic and atraumatic.     Right Ear: External ear normal.     Left Ear: External ear normal.     Mouth/Throat:     Mouth: Mucous membranes are dry.  Eyes:     General: No scleral icterus.       Right eye: No discharge.        Left eye: No discharge.  Conjunctiva/sclera: Conjunctivae normal.  Neck:     Trachea: No tracheal deviation.  Cardiovascular:     Rate and Rhythm: Normal rate.  Pulmonary:     Effort: Pulmonary effort is normal. No respiratory distress.     Breath sounds: No stridor.  Abdominal:     General: There is no distension.  Genitourinary:    Comments: Foley catheter in place, brown-colored urine in the catheter bag, pt's shorts are soaked with urine Musculoskeletal:        General: No swelling or deformity.     Cervical back: Neck supple.  Skin:    General: Skin is warm and dry.     Findings: No rash.  Neurological:     Comments: Patient nonverbal, withdraws to pain, no a facial asymmetry noted, does not follow commands     ED Results / Procedures / Treatments   Labs (all labs ordered are listed, but only abnormal  results are displayed) Labs Reviewed  CBC WITH DIFFERENTIAL/PLATELET - Abnormal; Notable for the following components:      Result Value   WBC 21.3 (*)    RBC 3.31 (*)    Hemoglobin 8.5 (*)    HCT 26.4 (*)    MCV 79.8 (*)    MCH 25.7 (*)    Platelets 148 (*)    Neutro Abs 18.0 (*)    Abs Immature Granulocytes 2.04 (*)    All other components within normal limits  BASIC METABOLIC PANEL - Abnormal; Notable for the following components:   CO2 19 (*)    Glucose, Bld 139 (*)    BUN 64 (*)    Creatinine, Ser 4.76 (*)    Calcium 7.7 (*)    GFR, Estimated 12 (*)    All other components within normal limits  LACTIC ACID, PLASMA - Abnormal; Notable for the following components:   Lactic Acid, Venous 2.4 (*)    All other components within normal limits  PROTIME-INR - Abnormal; Notable for the following components:   Prothrombin Time 15.6 (*)    INR 1.3 (*)    All other components within normal limits  APTT - Abnormal; Notable for the following components:   aPTT 43 (*)    All other components within normal limits  TROPONIN I (HIGH SENSITIVITY) - Abnormal; Notable for the following components:   Troponin I (High Sensitivity) 194 (*)    All other components within normal limits  RESP PANEL BY RT-PCR (FLU A&B, COVID) ARPGX2  CULTURE, BLOOD (ROUTINE X 2)  CULTURE, BLOOD (ROUTINE X 2)  URINE CULTURE  URINALYSIS, ROUTINE W REFLEX MICROSCOPIC  LACTIC ACID, PLASMA  SAMPLE TO BLOOD BANK  TROPONIN I (HIGH SENSITIVITY)    EKG EKG Interpretation  Date/Time:  Sunday April 04 2022 13:53:59 EDT Ventricular Rate:  78 PR Interval:  196 QRS Duration: 91 QT Interval:  382 QTC Calculation: 436 R Axis:   34 Text Interpretation: Sinus rhythm Abnormal R-wave progression, early transition Borderline ST elevation, anterolateral leads No significant change since last tracing Confirmed by Dorie Rank [phone]) on 04/04/2022 2:44:17 PM  Radiology DG Chest Port 1 View  Result Date:  04/04/2022 CLINICAL DATA:  Questionable sepsis. EXAM: PORTABLE CHEST 1 VIEW COMPARISON:  January 31, 2022 FINDINGS: Skin fold over the chest limit evaluation. Elevation of the left hemidiaphragm remains. Stable cardiomegaly. Stable tortuous thoracic aorta. The hila and mediastinum otherwise unchanged. No pneumothorax. No nodules or masses. No focal infiltrate identified. IMPRESSION: No active disease. Skin folds over the chest limit  evaluation. Electronically Signed   By: Dorise Bullion III M.D.   On: 04/04/2022 14:13    Procedures .Critical Care  Performed by: Dorie Rank, MD Authorized by: Dorie Rank, MD   Critical care provider statement:    Critical care time (minutes):  45   Critical care was time spent personally by me on the following activities:  Development of treatment plan with patient or surrogate, discussions with consultants, evaluation of patient's response to treatment, examination of patient, ordering and review of laboratory studies, ordering and review of radiographic studies, ordering and performing treatments and interventions, pulse oximetry, re-evaluation of patient's condition and review of old charts     Medications Ordered in ED Medications  lactated ringers infusion ( Intravenous New Bag/Given 04/04/22 1358)  lactated ringers bolus 1,000 mL (1,000 mLs Intravenous New Bag/Given 04/04/22 1359)    And  lactated ringers bolus 1,000 mL (1,000 mLs Intravenous New Bag/Given 04/04/22 1426)    And  lactated ringers bolus 500 mL (500 mLs Intravenous New Bag/Given 04/04/22 1427)  ceFEPIme (MAXIPIME) 2 g in sodium chloride 0.9 % 100 mL IVPB (2 g Intravenous New Bag/Given 04/04/22 1424)    ED Course/ Medical Decision Making/ A&P Clinical Course as of 04/04/22 1532  Sun Apr 04, 2022  1459 Patient's troponin elevated at 194.  Lactic acid level elevated 2.4 [JK]  1500 CBC with Differential(!) White blood cell count elevated at 21.3, hemoglobin decreased from most recent value  although more similar to previous levels [JK]  8657 Basic metabolic panel(!) Metabolic panel shows acute renal failure with creatinine of 4.7.  The patient did have urinary obstruction on arrival.  Foley catheter was replaced showing mucopurulent urine and urinary retention [JK]  1516 Blood pressure is improved to 114/70 [JK]  1516 Troponin I (High Sensitivity)(!!) Troponin elevated.  Suspect this is related to his hypotension and sepsis.  No ischemic changes noted on EKG [JK]  1522 Case discussed with Dr. Thereasa Solo regarding admission [JK]  1528 X-ray without acute findings [JK]    Clinical Course User Index [JK] Dorie Rank, MD                           Medical Decision Making Problems Addressed: Elevated troponin: acute illness or injury Sepsis with acute renal failure and septic shock, due to unspecified organism, unspecified acute renal failure type [facility]): acute illness or injury that poses a threat to life or bodily functions Urinary retention: acute illness or injury that poses a threat to life or bodily functions  Amount and/or Complexity of Data Reviewed Labs: ordered. Decision-making details documented in ED Course. Radiology: ordered and independent interpretation performed. ECG/medicine tests: ordered.  Risk Prescription drug management. Decision regarding hospitalization.   Patient presented to the ED for evaluation of urine leaking around the Foley catheter.  Patient noted to have low blood pressures on arrival.  Was concerned the possibility of sepsis with his hypotension and concern for urinary tract infection.  Patient had an indwelling Foley catheter that appeared to be obstructed.  The catheter was removed and a new catheter was placed.  Patient had obvious urinary retention and the urine was mucopurulent in nature.  Patient started on broad-spectrum antibiotics treated for possible sepsis.  His laboratory tests are notable for acute renal failure likely related to his  bladder outlet obstruction.  Patient also has elevated lactic acid level concerning for evolving sepsis.  Patient noted to have significant leukocytosis.  Patient started  on sepsis protocol with fluid resuscitation and antibiotics.  Troponin noted to be elevated.  Patient nonverbal so unable to determine if he is having chest pain but EKG is not showing any acute changes.  I suspect this is related to his hypotension.  Case discussed with Dr. Thereasa Solo regarding admission.        Final Clinical Impression(s) / ED Diagnoses Final diagnoses:  Urinary retention  Sepsis with acute renal failure and septic shock, due to unspecified organism, unspecified acute renal failure type ([facility])  Elevated troponin    Rx / DC Orders ED Discharge Orders     None         Dorie Rank, MD 04/04/22 [phone]

## 2022-04-04 NOTE — H&P (Signed)
ADMISSION HISTORY AND PHYSICAL  Theodore King ZOX:[id number] DOB: 14-Dec-1945 DOA: 04/04/2022  PCP: [name], MD Patient coming from: private home via EMS to ER   Chief Complaint: leaking around chronic foley   HPI:  76 year old with a history of Parkinson's disease with associated dementia, paralysis agitans, HLD, thrombocytopenia, and BPH w/ chronic urinary retention who was sent to the ER via EMS after he was found to be leaking urine around his Foley catheter.  Per reports the patient is nonverbal at baseline due to his dementia.  Upon arrival in the ER it was noted that the urine in his Foley catheter was turbulent and foul-smelling.  He was found to have a WBC of 21.3 and a lactate elevated at 2.4.  His Foley was noted to be occluded and was changed in the ER, resulting in the drainage of approximately 800 cc of urine.  He was also found to be suffering with acute kidney failure with a creatinine of 4.76 at presentation compared to a baseline of 1.35.  At the time of my evaluation the patient is waking up some, and is able to answer some simple questions. He denies focal pain. He appears comfortable. He is able to tell me he is at [facility].   Assessment/Plan  Acute kidney failure Likely due to occluded Foley leading to effective urinary retention - monitor trend with correction of obstruction  Acute urinary retention - occluded chronic Foley catheter POA Foley catheter was changed in the ER releaving approximately 800 cc of retained urine - catheter likely became obstructed due to heavy sediment due to UTI - monitor new Foley to assure it does not also become obstructed  Sepsis due to UTI POA due to above Met sepsis criteria with respiratory rate 22, WBC of 21.3, and obvious bacterial infection - empiric Rocephin therapy - follow culture  Lactic acidosis Due to above - gently hydrate and follow  Advanced Parkinson's disease with severe dementia Lives in a private  home with family in the [place] area - resume usual sinemet doses   Chronic thrombocytopenia Platelet count appears to be stable compared to baseline  Microcytic anemia Likely due to smoldering urinary tract infection -no evidence of acute blood loss -check anemia panel -monitor trend  DVT prophylaxis: Subcutaneous heparin Code Status: FULL Family Communication: No family present at time of exam Disposition Plan:  Admit to Inpatient   Review of Systems: As per HPI otherwise 10 point review of systems not able to be accomplished due to AMS.   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   SOB (shortness of breath) 03/23/2018   Unspecified vitamin D deficiency    Unspecified vitamin D deficiency    Vertigo, benign paroxysmal 11/05/2016   Vitamin D deficiency 05/18/2013    Past Surgical History:  Procedure Laterality Date   APPENDECTOMY     COLONOSCOPY WITH PROPOFOL N/A 10/26/2021   Procedure: COLONOSCOPY WITH PROPOFOL;  Surgeon: Irene Shipper, MD;  Location: [facility]  ENDOSCOPY;  Service: Gastroenterology;  Laterality: N/A;   POLYPECTOMY  10/26/2021   Procedure: POLYPECTOMY;  Surgeon: Irene Shipper, MD;  Location: [facility]OS[facility];  Service: Gastroenterology;;   [place]     right    Family History  Family History  Problem Relation Age of Onset   Cancer Mother    Cancer Father    Stroke Brother    Cancer Sister    Healthy Daughter    Cancer Sister     Lung cancer Brother    Healthy Daughter    Colon cancer Neg Hx    Esophageal cancer Neg Hx    Rectal cancer Neg Hx    Stomach cancer Neg Hx     Social History   reports that he quit smoking about 47 years ago. His smoking use included cigarettes. He started smoking about 68 years ago. He has a 10.00 pack-year smoking history. He has never used smokeless tobacco. He reports that he does not currently use alcohol. He reports that he does not use drugs.  Allergies No Known Allergies  Prior to Admission medications   Medication Sig Start Date End Date Taking? Authorizing Provider  acetaminophen (TYLENOL) 500 MG tablet Take 500 mg by mouth every 6 (six) hours as needed for mild pain.   Yes [provider]  albuterol (VENTOLIN HFA) 108 (90 Base) MCG/ACT inhaler Inhale 2 puffs into the lungs every 4 (four) hours as needed for wheezing or shortness of breath.  10/03/18  Yes [provider]  carbidopa-levodopa (SINEMET CR) 50-200 MG tablet Take 1 tablet by mouth at bedtime. 09/24/21  Yes Penumalli, Earlean Polka, MD  carbidopa-levodopa (SINEMET IR) 25-100 MG tablet Take 2 tablets at 7 AM, 2 tablets at 10 AM, 2 tablets at 2 PM and 1 tablet at 6 PM Patient taking differently: Take 1-2 tablets by mouth See admin instructions. Take 2 tablets by mouth every morning and afternoon, take 1 tablet at bedtime 09/24/21  Yes Penumalli, Earlean Polka, MD  fludrocortisone (FLORINEF) 0.1 MG tablet Take 0.1 mg by mouth daily.   Yes [provider]  glycopyrrolate (ROBINUL) 1 MG tablet Take 1 tablet (1 mg total) by mouth 2 (two) times daily. 09/24/21  Yes Penumalli, Earlean Polka, MD  sertraline (ZOLOFT) 25 MG tablet Take 1 tablet (25 mg total) by mouth daily. 09/24/21  Yes Penumalli, Earlean Polka, MD  traZODone (DESYREL) 50 MG tablet Take 50 mg by mouth at bedtime. 08/17/20  Yes [provider]    Physical Exam: Vitals:   04/04/22 1458 04/04/22 1500 04/04/22 1530 04/04/22 1533  BP: 114/70 129/66  127/65   Pulse: 88 78 85   Resp: 18 15 (!) 22   Temp: (!) 97.5 F (36.4 C)   (!) 97.4 F (36.3 C)  TempSrc: Axillary   Axillary  SpO2: 99% 100% 100%   Weight:  77.2 kg    Height:  _0  (1.854 m)      Constitutional: NAD, slow to respond but beginning to wake up ENMT: Mucous membranes are dry.  Respiratory: clear to auscultation bilaterally, no wheezing, no crackles. Normal respiratory effort. No accessory muscle use.  Cardiovascular: Regular rate and rhythm, no murmurs / rubs / gallops. No extremity edema. 2+ pedal pulses.  Abdomen: No tenderness or masses to palpation. No hepatosplenomegaly. Bowel sounds positive. Not distended. Soft. Foley cath in place and draining cloudy turbid fluid.  Musculoskeletal: No clubbing / cyanosis. No joint deformity upper and lower extremities. No contractures.  Skin: No rashes, lesions, ulcers.  Neurologic: CN 2-12 grossly intact B. Sensation intact.   Labs on Admission:   CBC: Recent Labs  Lab 04/04/22 1331  WBC 21.3*  NEUTROABS 18.0*  HGB 8.5*  HCT 26.4*  MCV 79.8*  PLT 725*   Basic Metabolic Panel: Recent Labs  Lab 04/04/22 1331  NA 139  K 4.5  CL 111  CO2 19*  GLUCOSE 139*  BUN 64*  CREATININE 4.76*  CALCIUM 7.7*   GFR: Estimated Creatinine Clearance: 14 mL/min (A) (by C-G formula based on SCr of 4.76 mg/dL (H)).  Coagulation Profile: Recent Labs  Lab 04/04/22 1412  INR 1.3*   Urine analysis:    Component Value Date/Time   COLORURINE YELLOW 04/04/2022 1322   APPEARANCEUR TURBID (A) 04/04/2022 1322   LABSPEC  04/04/2022 1322    TEST NOT REPORTED DUE TO COLOR INTERFERENCE OF URINE PIGMENT   PHURINE  04/04/2022 1322    TEST NOT REPORTED DUE TO COLOR INTERFERENCE OF URINE PIGMENT   GLUCOSEU (A) 04/04/2022 1322    TEST NOT REPORTED DUE TO COLOR INTERFERENCE OF URINE PIGMENT   HGBUR (A) 04/04/2022 1322    TEST NOT REPORTED DUE TO COLOR INTERFERENCE OF URINE PIGMENT   BILIRUBINUR (A) 04/04/2022 1322    TEST NOT  REPORTED DUE TO COLOR INTERFERENCE OF URINE PIGMENT   KETONESUR (A) 04/04/2022 1322    TEST NOT REPORTED DUE TO COLOR INTERFERENCE OF URINE PIGMENT   PROTEINUR (A) 04/04/2022 1322    TEST NOT REPORTED DUE TO COLOR INTERFERENCE OF URINE PIGMENT   UROBILINOGEN 1.0 04/16/2020 2048   NITRITE (A) 04/04/2022 1322    TEST NOT REPORTED DUE TO COLOR INTERFERENCE OF URINE PIGMENT   LEUKOCYTESUR (A) 04/04/2022 1322    TEST NOT REPORTED DUE TO COLOR INTERFERENCE OF URINE PIGMENT     Radiological Exams on Admission: DG Chest Port 1 View  Result Date: 04/04/2022 CLINICAL DATA:  Questionable sepsis. EXAM: PORTABLE CHEST 1 VIEW COMPARISON:  January 31, 2022 FINDINGS: Skin fold over the chest limit evaluation. Elevation of the left hemidiaphragm remains. Stable cardiomegaly. Stable tortuous thoracic aorta. The hila and mediastinum otherwise unchanged. No pneumothorax. No nodules or masses. No focal infiltrate identified. IMPRESSION: No active disease. Skin folds over the chest limit evaluation. Electronically Signed   By: Dorise Bullion III M.D.   On: 04/04/2022 14:13     Cherene Altes, MD [facility] Office  [phone] Pager - Text Page per Amion as per below:  On-Call/Text Page:      [name]  If 7PM-7AM, please contact night-coverage [email or web address] 04/04/2022, 4:23 PM

## 2022-04-04 NOTE — Progress Notes (Signed)
Patient ID: Theodore King, male   DOB: 1946-02-21, 76 y.o.   MRN: [id number] Patient arrived to floor at 1715. After room was finished being cleaned, patient was brought in from hallway. Amount of fluid received for Fluid Resuscitation for Sepsis Protocol charted as completed in ED verified with ED nurse due to multiple full bags of Lactated Ringers hanging on IV pole. MD at bedside. Patient resting quietly at this time VSS. BP 118/65 (BP Location: Left Arm)   Pulse 76   Temp 97.8 F (36.6 C) (Oral)   Resp 16   Ht '6\' 1"'$  (1.854 m)   Wt 77.2 kg   SpO2 100%   BMI 22.45 kg/m  Will continue to monitor.  Haydee Salter, RN

## 2022-04-04 NOTE — Progress Notes (Signed)
Pharmacy Antibiotic Note  Theodore King is a 76 y.o. male admitted on 04/04/2022 with sepsis secondary to UTI.  Pharmacy has been consulted for Cefepime dosing.  Plan: Cefepime 2g IV q24h Monitor renal function, cultures, clinical course  Height: '6\' 1"'$  (185.4 cm) Weight: 77.2 kg (170 lb 3.1 oz) IBW/kg (Calculated) : 79.9  Temp (24hrs), Avg:97.9 F (36.6 C), Min:97.4 F (36.3 C), Max:98.4 F (36.9 C)  Recent Labs  Lab 04/04/22 1331 04/04/22 1345 04/04/22 1537  WBC 21.3*  --   --   CREATININE 4.76*  --   --   LATICACIDVEN  --  2.4* 2.2*    Estimated Creatinine Clearance: 14 mL/min (A) (by C-G formula based on SCr of 4.76 mg/dL (H)).    No Known Allergies  Antimicrobials this admission: 10/29 Cefepime >>  Dose adjustments this admission: --  Microbiology results: 10/29 BCx:  10/29 UCx:     Thank you for allowing pharmacy to be a part of this patient's care.   Lindell Spar, PharmD, BCPS Clinical Pharmacist  04/04/2022 5:11 PM

## 2022-04-04 NOTE — ED Provider Triage Note (Signed)
Emergency Medicine Provider Triage Evaluation Note  Theodore King , a 76 y.o. male  was evaluated in triage.  Pt BIB [facility].  Patient is nonverbal.  He had urine leaking from the insertion site.  He was given Keflex about 3 weeks ago for UTI.  Urine appeared dark.  Blood pressure 90/49 in triage.  No fever.  Review of Systems  Positive: As above Negative: Above  Physical Exam  BP (!) 90/49 (BP Location: Left Arm)   Pulse 80   Temp 98.4 F (36.9 C) (Oral)   Resp 16   SpO2 100% Comment: Simultaneous filing. User may not have seen previous data. Gen:   Awake, no distress   Resp:  Normal effort  MSK:   Has difficulty moving his legs. Other:    Medical Decision Making  Medically screening exam initiated at 1:16 PM.  Appropriate orders placed.  Theodore King was informed that the remainder of the evaluation will be completed by another provider, this initial triage assessment does not replace that evaluation, and the importance of remaining in the ED until their evaluation is complete.  Work-up initiated   Rex Kras, [place] 04/04/22 1351

## 2022-04-04 NOTE — Sepsis Progress Note (Signed)
Sepsis protocol is being followed by [facility]. 

## 2022-04-05 DIAGNOSIS — R7881 Bacteremia: Secondary | ICD-10-CM

## 2022-04-05 DIAGNOSIS — R6520 Severe sepsis without septic shock: Secondary | ICD-10-CM | POA: Diagnosis not present

## 2022-04-05 DIAGNOSIS — Z9359 Other cystostomy status: Secondary | ICD-10-CM

## 2022-04-05 DIAGNOSIS — Z978 Presence of other specified devices: Secondary | ICD-10-CM

## 2022-04-05 DIAGNOSIS — A419 Sepsis, unspecified organism: Secondary | ICD-10-CM | POA: Diagnosis not present

## 2022-04-05 DIAGNOSIS — R339 Retention of urine, unspecified: Secondary | ICD-10-CM | POA: Diagnosis not present

## 2022-04-05 DIAGNOSIS — R6521 Severe sepsis with septic shock: Secondary | ICD-10-CM

## 2022-04-05 LAB — BLOOD CULTURE ID PANEL (REFLEXED) - BCID2
A.calcoaceticus-baumannii: NOT DETECTED
Bacteroides fragilis: NOT DETECTED
CTX-M ESBL: NOT DETECTED
Candida albicans: NOT DETECTED
Candida auris: NOT DETECTED
Candida glabrata: NOT DETECTED
Candida krusei: NOT DETECTED
Candida parapsilosis: NOT DETECTED
Candida tropicalis: NOT DETECTED
Carbapenem resist OXA 48 LIKE: NOT DETECTED
Carbapenem resistance IMP: NOT DETECTED
Carbapenem resistance KPC: NOT DETECTED
Carbapenem resistance NDM: NOT DETECTED
Carbapenem resistance VIM: NOT DETECTED
Cryptococcus neoformans/gattii: NOT DETECTED
Enterobacter cloacae complex: DETECTED — AB
Enterobacterales: DETECTED — AB
Enterococcus Faecium: NOT DETECTED
Enterococcus faecalis: DETECTED — AB
Escherichia coli: NOT DETECTED
Haemophilus influenzae: NOT DETECTED
Klebsiella aerogenes: NOT DETECTED
Klebsiella oxytoca: NOT DETECTED
Klebsiella pneumoniae: NOT DETECTED
Listeria monocytogenes: NOT DETECTED
Neisseria meningitidis: NOT DETECTED
Proteus species: NOT DETECTED
Pseudomonas aeruginosa: NOT DETECTED
Salmonella species: NOT DETECTED
Serratia marcescens: NOT DETECTED
Staphylococcus aureus (BCID): NOT DETECTED
Staphylococcus epidermidis: NOT DETECTED
Staphylococcus lugdunensis: NOT DETECTED
Staphylococcus species: NOT DETECTED
Stenotrophomonas maltophilia: NOT DETECTED
Streptococcus agalactiae: NOT DETECTED
Streptococcus pneumoniae: NOT DETECTED
Streptococcus pyogenes: NOT DETECTED
Streptococcus species: NOT DETECTED
Vancomycin resistance: NOT DETECTED

## 2022-04-05 LAB — COMPREHENSIVE METABOLIC PANEL
ALT: 8 U/L (ref 0–44)
AST: 52 U/L — ABNORMAL HIGH (ref 15–41)
Albumin: 2 g/dL — ABNORMAL LOW (ref 3.5–5.0)
Alkaline Phosphatase: 73 U/L (ref 38–126)
Anion gap: 5 (ref 5–15)
BUN: 64 mg/dL — ABNORMAL HIGH (ref 8–23)
CO2: 23 mmol/L (ref 22–32)
Calcium: 7.5 mg/dL — ABNORMAL LOW (ref 8.9–10.3)
Chloride: 111 mmol/L (ref 98–111)
Creatinine, Ser: 3.62 mg/dL — ABNORMAL HIGH (ref 0.61–1.24)
GFR, Estimated: 16 mL/min — ABNORMAL LOW (ref >60)
Glucose, Bld: 100 mg/dL — ABNORMAL HIGH (ref 70–99)
Potassium: 4 mmol/L (ref 3.5–5.1)
Sodium: 139 mmol/L (ref 135–145)
Total Bilirubin: 0.6 mg/dL (ref 0.3–1.2)
Total Protein: 5.7 g/dL — ABNORMAL LOW (ref 6.5–8.1)

## 2022-04-05 LAB — CBC
HCT: 24.4 % — ABNORMAL LOW (ref 39.0–52.0)
Hemoglobin: 7.9 g/dL — ABNORMAL LOW (ref 13.0–17.0)
MCH: 25.5 pg — ABNORMAL LOW (ref 26.0–34.0)
MCHC: 32.4 g/dL (ref 30.0–36.0)
MCV: 78.7 fL — ABNORMAL LOW (ref 80.0–100.0)
Platelets: 153 10*3/uL (ref 150–400)
RBC: 3.1 MIL/uL — ABNORMAL LOW (ref 4.22–5.81)
RDW: 14.6 % (ref 11.5–15.5)
WBC: 18.4 10*3/uL — ABNORMAL HIGH (ref 4.0–10.5)
nRBC: 0 % (ref 0.0–0.2)

## 2022-04-05 MED ORDER — CHLORHEXIDINE GLUCONATE CLOTH 2 % EX PADS
6.0000 | MEDICATED_PAD | Freq: Every day | CUTANEOUS | Status: DC
  Administered 2022-04-05 – 2022-04-11 (×7): 6 via TOPICAL

## 2022-04-05 MED ORDER — SODIUM CHLORIDE 0.9 % IV SOLN
2.0000 g | Freq: Three times a day (TID) | INTRAVENOUS | Status: DC
  Administered 2022-04-05 – 2022-04-08 (×9): 2 g via INTRAVENOUS
  Filled 2022-04-05 (×10): qty 2000

## 2022-04-05 MED ORDER — ACETAMINOPHEN 325 MG PO TABS
650.0000 mg | ORAL_TABLET | Freq: Four times a day (QID) | ORAL | Status: DC | PRN
  Administered 2022-04-07 (×2): 650 mg via ORAL
  Filled 2022-04-05 (×2): qty 2

## 2022-04-05 NOTE — Progress Notes (Signed)
PHARMACY - PHYSICIAN COMMUNICATION CRITICAL VALUE ALERT - BLOOD CULTURE IDENTIFICATION (BCID)  Theodore King is an 76 y.o. male who presented to [facility] on 04/04/2022 with a chief complaint of AKI, Urosepsis.  Assessment:  Blood cultures growing Enterococcus faecalis (1/4 bottles) and Enterobacter cloacae (2/4 bottles; one from each set). Urine cultures pending, recent hx enterobacter cloacae in urine.  Name of physician (or Provider) Contacted: Dr. Thereasa Solo  Current antibiotics:  Cefepime 2gm q 24 hrs  Changes to prescribed antibiotics recommended:  Continue Cefepime Ampicillin 2gm IV q 8 hrs added per ID recommendations  Results for orders placed or performed during the hospital encounter of 04/04/22  Blood Culture ID Panel (Reflexed) (Collected: 04/04/2022  1:42 PM)  Result Value Ref Range   Enterococcus faecalis DETECTED (A) NOT DETECTED   Enterococcus Faecium NOT DETECTED NOT DETECTED   Listeria monocytogenes NOT DETECTED NOT DETECTED   Staphylococcus species NOT DETECTED NOT DETECTED   Staphylococcus aureus (BCID) NOT DETECTED NOT DETECTED   Staphylococcus epidermidis NOT DETECTED NOT DETECTED   Staphylococcus lugdunensis NOT DETECTED NOT DETECTED   Streptococcus species NOT DETECTED NOT DETECTED   Streptococcus agalactiae NOT DETECTED NOT DETECTED   Streptococcus pneumoniae NOT DETECTED NOT DETECTED   Streptococcus pyogenes NOT DETECTED NOT DETECTED   A.calcoaceticus-baumannii NOT DETECTED NOT DETECTED   Bacteroides fragilis NOT DETECTED NOT DETECTED   Enterobacterales DETECTED (A) NOT DETECTED   Enterobacter cloacae complex DETECTED (A) NOT DETECTED   Escherichia coli NOT DETECTED NOT DETECTED   Klebsiella aerogenes NOT DETECTED NOT DETECTED   Klebsiella oxytoca NOT DETECTED NOT DETECTED   Klebsiella pneumoniae NOT DETECTED NOT DETECTED   Proteus species NOT DETECTED NOT DETECTED   Salmonella species NOT DETECTED NOT DETECTED   Serratia marcescens NOT DETECTED  NOT DETECTED   Haemophilus influenzae NOT DETECTED NOT DETECTED   Neisseria meningitidis NOT DETECTED NOT DETECTED   Pseudomonas aeruginosa NOT DETECTED NOT DETECTED   Stenotrophomonas maltophilia NOT DETECTED NOT DETECTED   Candida albicans NOT DETECTED NOT DETECTED   Candida auris NOT DETECTED NOT DETECTED   Candida glabrata NOT DETECTED NOT DETECTED   Candida krusei NOT DETECTED NOT DETECTED   Candida parapsilosis NOT DETECTED NOT DETECTED   Candida tropicalis NOT DETECTED NOT DETECTED   Cryptococcus neoformans/gattii NOT DETECTED NOT DETECTED   CTX-M ESBL NOT DETECTED NOT DETECTED   Carbapenem resistance IMP NOT DETECTED NOT DETECTED   Carbapenem resistance KPC NOT DETECTED NOT DETECTED   Carbapenem resistance NDM NOT DETECTED NOT DETECTED   Carbapenem resist OXA 48 LIKE NOT DETECTED NOT DETECTED   Vancomycin resistance NOT DETECTED NOT DETECTED   Carbapenem resistance VIM NOT DETECTED NOT DETECTED    [name] 04/05/2022  11:35 AM

## 2022-04-05 NOTE — Consult Note (Signed)
[facility] for Infectious Disease    Date of Admission:  04/04/2022     Reason for Consult: polymicrobial bacteremia    Referring Provider: Forde Dandy    Abx: 10/30-c ampicillin 10/29-c cefepime        Assessment: 76 yo male with dementia, chronic foley catheter, admitted 10/29 from home for ?a few days foley being plugged with peri-catheter leakage/purulence,  found to meet sepsis criteria and polymicrobial bacteremia  Unclear duration of sx but seems to be less than a week. The enterococcus is low burden, and I do suspect it to be urinary, in setting of obstructive foley catheter. So unless it grows again, I do not feel a tee is needed.  The urine cx is growing gnr as well, suspect enterobacter  He has no hardware/cardiac device/vascular catheter and he denies new focal joint/back pain at this time  Plan: Continue cefepime; add ampicillin Pending susceptibility potenitially we do have oral options for this Repeat blood cx If repeat bcx still grow enterococcus will need tee to r/o endocarditis Discussed with primary team   I spent 75 minute reviewing data/chart, and coordinating care and >50% direct face to face time providing counseling/discussing diagnostics/treatment plan with patient      ------------------------------------------------ Principal Problem:   Sepsis ([place])    HPI: Theodore King is a 76 y.o. male with dementia and chronic foley cath, brought in from home on 10/29 for obstructed foley with purulence/urinary foley leak and ams  Patient currently improved alertness and was able to give me some hx which seems consistent with the H&P  He reports being in the hospital now and was brought in by ems for foley catheter problem of a few days. He reports his daughter called the ems  He has no urinary sx now or focal discomfort  He denies having hardware/vascular device on his body  On arrival: Afebrile; hds; 100% room  air Wbc 21 Foley occluded and was changed in ed Bcx obtained Started on cefepime  Today 24 hours within admission his bcx returned with enterobacter/enterococcus by bcid He continues without fever or hemodynamic disturbance    Family History  Problem Relation Age of Onset   Cancer Mother    Cancer Father    Stroke Brother    Cancer Sister    Healthy Daughter    Cancer Sister    Lung cancer Brother    Healthy Daughter    Colon cancer Neg Hx    Esophageal cancer Neg Hx    Rectal cancer Neg Hx    Stomach cancer Neg Hx     Social History   Tobacco Use   Smoking status: Former    Packs/day: 0.50    Years: 20.00    Total pack years: 10.00    Types: Cigarettes    Start date: 08/14/1953    Quit date: 05/23/1974    Years since quitting: 47.9   Smokeless tobacco: Never  Vaping Use   Vaping Use: Never used  Substance Use Topics   Alcohol use: Not Currently   Drug use: No    No Known Allergies  Review of Systems: ROS All Other ROS was negative, except mentioned above   Past Medical History:  Diagnosis Date   Abnormal dreams 09/06/2013   Aortic atherosclerosis ([place]) 10/23/2021   BPH (benign prostatic hyperplasia) 10/23/2021   Disorders of bursae and tendons in shoulder region, unspecified    Hyperglycemia 05/18/2013   Hyperlipidemia LDL goal <  100 05/18/2013   Other and unspecified hyperlipidemia    Other and unspecified hyperlipidemia    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)    Paralysis agitans 05/18/2013   Parkinson's disease    Pneumonia due to COVID-19 virus 04/08/2019   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   SOB (shortness of breath) 03/23/2018   Unspecified vitamin D deficiency    Unspecified vitamin D deficiency    Vertigo, benign paroxysmal 11/05/2016   Vitamin D deficiency 05/18/2013       Scheduled Meds:  carbidopa-levodopa  1 tablet Oral QHS   carbidopa-levodopa  2 tablet Oral 2 times per day   And   carbidopa-levodopa  1  tablet Oral Daily   fludrocortisone  0.1 mg Oral Daily   glycopyrrolate  1 mg Oral BID   heparin  5,000 Units Subcutaneous Q8H   sertraline  25 mg Oral Daily   Continuous Infusions:  ampicillin (OMNIPEN) IV     ceFEPime (MAXIPIME) IV     lactated ringers 75 mL/hr at 04/05/22 1012   PRN Meds:.acetaminophen, bisacodyl, ondansetron **OR** ondansetron (ZOFRAN) IV, polyethylene glycol   OBJECTIVE: Blood pressure 124/65, pulse 72, temperature 97.9 F (36.6 C), resp. rate 16, height '6\' 1"'$  (1.854 m), weight 77.2 kg, SpO2 100 %.  Physical Exam  General/constitutional: no distress, pleasant, lying in bed, interactive/cooperative HEENT: Normocephalic, PER, Conj Clear, EOMI, Oropharynx clear Neck supple CV: rrr no mrg Lungs: clear to auscultation, normal respiratory effort Abd: Soft, Nontender Ext: no edema Skin: No Rash Neuro: per chart/patient chronic generalized weakness without rigidity or tremor/clonus MSK: no peripheral joint swelling/tenderness/warmth; back spines nontender   Lab Results Lab Results  Component Value Date   WBC 18.4 (H) 04/05/2022   HGB 7.9 (L) 04/05/2022   HCT 24.4 (L) 04/05/2022   MCV 78.7 (L) 04/05/2022   PLT 153 04/05/2022    Lab Results  Component Value Date   CREATININE 3.62 (H) 04/05/2022   BUN 64 (H) 04/05/2022   NA 139 04/05/2022   K 4.0 04/05/2022   CL 111 04/05/2022   CO2 23 04/05/2022    Lab Results  Component Value Date   ALT 8 04/05/2022   AST 52 (H) 04/05/2022   ALKPHOS 73 04/05/2022   BILITOT 0.6 04/05/2022      Microbiology: Recent Results (from the past 240 hour(s))  Urine Culture     Status: Abnormal (Preliminary result)   Collection Time: 04/04/22  1:37 PM   Specimen: In/Out Cath Urine  Result Value Ref Range Status   Specimen Description   Final    IN/OUT CATH URINE Performed at [facility], [place] [address], [place], [place] [address]    Special Requests   Final    NONE Performed at [facility], [place] [address] [place], [place] [address]    Culture (A)  Final    >=100,000 COLONIES/mL GRAM NEGATIVE RODS CULTURE REINCUBATED FOR BETTER GROWTH SUSCEPTIBILITIES TO FOLLOW Performed at [facility], [place] [address] [place], [place] [address]    Report Status PENDING  Incomplete  Blood Culture (routine x 2)     Status: None (Preliminary result)   Collection Time: 04/04/22  1:42 PM   Specimen: BLOOD  Result Value Ref Range Status   Specimen Description   Final    BLOOD LEFT ANTECUBITAL Performed at [place] [address], [place], [place] [address]    Special Requests   Final    BOTTLES DRAWN AEROBIC AND  ANAEROBIC Blood Culture results may not be optimal due to an inadequate volume of blood received in culture bottles Performed at [facility], [place] [address], [place], [place] [address]    Culture  Setup Time   Final    GRAM NEGATIVE RODS GRAM POSITIVE COCCI IN CHAINS AEROBIC BOTTLE ONLY CRITICAL RESULT CALLED TO, READ BACK BY AND VERIFIED WITH: [facility] [id number] AT 1046 BY CM Performed at [facility], [place] [address] [place], [place] [address]    Culture Lonell Grandchild NEGATIVE RODS GRAM POSITIVE COCCI   Final   Report Status PENDING  Incomplete  Blood Culture ID Panel (Reflexed)     Status: Abnormal   Collection Time: 04/04/22  1:42 PM  Result Value Ref Range Status   Enterococcus faecalis DETECTED (A) NOT DETECTED Final    Comment: CRITICAL RESULT CALLED TO, READ BACK BY AND VERIFIED WITH: PHARMD D WOFFORD [id number] AT 1046 AM BY CM    Enterococcus Faecium NOT DETECTED NOT DETECTED Final   Listeria monocytogenes NOT DETECTED NOT DETECTED Final   Staphylococcus species NOT DETECTED NOT DETECTED Final   Staphylococcus aureus (BCID) NOT DETECTED NOT DETECTED Final   Staphylococcus epidermidis NOT DETECTED NOT DETECTED Final   Staphylococcus lugdunensis NOT DETECTED NOT DETECTED Final   Streptococcus  species NOT DETECTED NOT DETECTED Final   Streptococcus agalactiae NOT DETECTED NOT DETECTED Final   Streptococcus pneumoniae NOT DETECTED NOT DETECTED Final   Streptococcus pyogenes NOT DETECTED NOT DETECTED Final   A.calcoaceticus-baumannii NOT DETECTED NOT DETECTED Final   Bacteroides fragilis NOT DETECTED NOT DETECTED Final   Enterobacterales DETECTED (A) NOT DETECTED Final    Comment: Enterobacterales represent a large order of gram negative bacteria, not a single organism. CRITICAL RESULT CALLED TO, READ BACK BY AND VERIFIED WITH: PHARMD D WOFFORD [id number] AT 1046 AM BY CM    Enterobacter cloacae complex DETECTED (A) NOT DETECTED Final    Comment: CRITICAL RESULT CALLED TO, READ BACK BY AND VERIFIED WITH: PHARMD D WOFFORD [id number] AT 1046 AM BY CM    Escherichia coli NOT DETECTED NOT DETECTED Final   Klebsiella aerogenes NOT DETECTED NOT DETECTED Final   Klebsiella oxytoca NOT DETECTED NOT DETECTED Final   Klebsiella pneumoniae NOT DETECTED NOT DETECTED Final   Proteus species NOT DETECTED NOT DETECTED Final   Salmonella species NOT DETECTED NOT DETECTED Final   Serratia marcescens NOT DETECTED NOT DETECTED Final   Haemophilus influenzae NOT DETECTED NOT DETECTED Final   Neisseria meningitidis NOT DETECTED NOT DETECTED Final   Pseudomonas aeruginosa NOT DETECTED NOT DETECTED Final   Stenotrophomonas maltophilia NOT DETECTED NOT DETECTED Final   Candida albicans NOT DETECTED NOT DETECTED Final   Candida auris NOT DETECTED NOT DETECTED Final   Candida glabrata NOT DETECTED NOT DETECTED Final   Candida krusei NOT DETECTED NOT DETECTED Final   Candida parapsilosis NOT DETECTED NOT DETECTED Final   Candida tropicalis NOT DETECTED NOT DETECTED Final   Cryptococcus neoformans/gattii NOT DETECTED NOT DETECTED Final   CTX-M ESBL NOT DETECTED NOT DETECTED Final   Carbapenem resistance IMP NOT DETECTED NOT DETECTED Final   Carbapenem resistance KPC NOT DETECTED NOT DETECTED Final    Carbapenem resistance NDM NOT DETECTED NOT DETECTED Final   Carbapenem resist OXA 48 LIKE NOT DETECTED NOT DETECTED Final   Vancomycin resistance NOT DETECTED NOT DETECTED Final   Carbapenem resistance VIM NOT DETECTED NOT DETECTED Final    Comment: Performed at [facility], [address] [place], [place] [address]  Blood Culture (routine x 2)     Status: None (Preliminary result)   Collection Time: 04/04/22  1:45 PM   Specimen: BLOOD  Result Value Ref Range Status   Specimen Description   Final    BLOOD RIGHT ANTECUBITAL Performed at [place] [address] [place], [place] [address]    Special Requests   Final    BOTTLES DRAWN AEROBIC AND ANAEROBIC Blood Culture adequate volume Performed at [place] [address], [place], [place] [address]    Culture  Setup Time   Final    GRAM NEGATIVE RODS AEROBIC BOTTLE ONLY CRITICAL VALUE NOTED.  VALUE IS CONSISTENT WITH PREVIOUSLY REPORTED AND CALLED VALUE.    Culture   Final    CULTURE REINCUBATED FOR BETTER GROWTH Performed at [facility], [place] [address] [place], [place] [address]    Report Status PENDING  Incomplete  Resp Panel by RT-PCR (Flu A&B, Covid)     Status: None   Collection Time: 04/04/22  4:22 PM   Specimen: Nasal Swab  Result Value Ref Range Status   SARS Coronavirus 2 by RT PCR NEGATIVE NEGATIVE Final    Comment: (NOTE) SARS-CoV-2 target nucleic acids are NOT DETECTED.  The SARS-CoV-2 RNA is generally detectable in upper respiratory specimens during the acute phase of infection. The lowest concentration of SARS-CoV-2 viral copies this assay can detect is 138 copies/mL. A negative result does not preclude SARS-Cov-2 infection and should not be used as the sole basis for treatment or other patient management decisions. A negative result may occur with  improper specimen collection/handling, submission of specimen other than nasopharyngeal swab, presence  of viral mutation(s) within the areas targeted by this assay, and inadequate number of viral copies(<138 copies/mL). A negative result must be combined with clinical observations, patient history, and epidemiological information. The expected result is Negative.  Fact Sheet for Patients:  EntrepreneurPulse.com.au  Fact Sheet for Healthcare Providers:  IncredibleEmployment.be  This test is no t yet approved or cleared by the [place] FDA and  has been authorized for detection and/or diagnosis of SARS-CoV-2 by FDA under an Emergency Use Authorization (EUA). This EUA will remain  in effect (meaning this test can be used) for the duration of the COVID-19 declaration under Section 564(b)(1) of the Act, 21 U.S.C.section 360bbb-3(b)(1), unless the authorization is terminated  or revoked sooner.       Influenza A by PCR NEGATIVE NEGATIVE Final   Influenza B by PCR NEGATIVE NEGATIVE Final    Comment: (NOTE) The Xpert Xpress SARS-CoV-2/FLU/RSV plus assay is intended as an aid in the diagnosis of influenza from Nasopharyngeal swab specimens and should not be used as a sole basis for treatment. Nasal washings and aspirates are unacceptable for Xpert Xpress SARS-CoV-2/FLU/RSV testing.  Fact Sheet for Patients: EntrepreneurPulse.com.au  Fact Sheet for Healthcare Providers: IncredibleEmployment.be  This test is not yet approved or cleared by the [place] FDA and has been authorized for detection and/or diagnosis of SARS-CoV-2 by FDA under an Emergency Use Authorization (EUA). This EUA will remain in effect (meaning this test can be used) for the duration of the COVID-19 declaration under Section 564(b)(1) of the Act, 21 U.S.C. section 360bbb-3(b)(1), unless the authorization is terminated or revoked.  Performed at [facility], [place] [address] [place], [place] [address]       Serology:    Imaging: If present, new imagings (plain films, ct scans, and  mri) have been personally visualized and interpreted; radiology reports have been reviewed. Decision making incorporated into the Impression / Recommendations.  10/29 cxr No active disease. Skin folds over the chest limit evaluation.    Jabier Mutton, [facility] for Infectious [place] [phone] pager    04/05/2022, 11:43 AM

## 2022-04-05 NOTE — Progress Notes (Signed)
Theodore King  LYY:[id number] DOB: 16-Aug-1945 DOA: 04/04/2022 PCP: [name], MD    Brief Narrative:  76 year old with a history of Parkinson's disease with associated dementia, paralysis agitans, HLD, thrombocytopenia, and BPH w/ chronic urinary retention who was sent to the ER via EMS after he was found to be leaking urine around his Foley catheter.  Upon arrival in the ER it was noted that the urine in his Foley catheter was turbulent and foul-smelling.  He was found to have a WBC of 21.3 and a lactate elevated at 2.4.  His Foley was noted to be occluded and was changed in the ER, resulting in the drainage of approximately 800 cc of urine.  He was also found to be suffering with acute kidney failure with a creatinine of 4.76 at presentation compared to a baseline of 1.35.  Consultants:  None  Goals of Care:  Code Status: Full Code   DVT prophylaxis: Subcutaneous heparin  Interim Hx: Afebrile since admission.  Vital signs stable.  Renal function has improved since admission.  Electrolytes stable otherwise.  WBC trending downward. Much more alert today. Slow to respond to questions, but able to carry on a conversation with patience. Denies focal complaints.   Assessment & Plan:  Acute kidney failure Likely due to occluded Foley leading to effective urinary retention - creatinine trending downward after obstruction relieved   Acute urinary retention - occluded chronic Foley catheter POA Foley catheter was changed in the ER relieving approximately 800 cc of retained urine - catheter likely became obstructed due to heavy sediment due to UTI - monitor new Foley to assure it does not also become obstructed   Sepsis due to UTI POA due to above - gram-negative rod bacteremia Met sepsis criteria with respiratory rate 22, WBC of 21.3, and obvious bacterial infection - empiric Rocephin therapy - sepsis physiology has now resolved with patient stabilizing hemodynamically   Lactic  acidosis Due to above - improved with hydration   Advanced Parkinson's disease with severe dementia Lives in a private home with family in the [place] area - continue usual sinemet dosing   Chronic thrombocytopenia Platelet count appears to be stable compared to baseline   Microcytic anemia Likely due to smoldering urinary tract infection - no evidence of acute blood loss - check anemia panel in a.m. - monitor trend  Family Communication: no family present at time of exam  Disposition: unclear at this time - lives w/ family - probable return to prior living arrangement    Objective: Blood pressure 124/65, pulse 72, temperature 97.9 F (36.6 C), resp. rate 16, height _0  (1.854 m), weight 77.2 kg, SpO2 100 %.  Intake/Output Summary (Last 24 hours) at 04/05/2022 0920 Last data filed at 04/05/2022 0458 Gross per 24 hour  Intake 1493.06 ml  Output 1900 ml  Net -406.94 ml   Filed Weights   04/04/22 1500  Weight: 77.2 kg    Examination: General: No acute respiratory distress Lungs: Clear to auscultation bilaterally without wheezes or crackles Cardiovascular: Regular rate and rhythm without murmur gallop or rub normal S1 and S2 Abdomen: Nontender, nondistended, soft, bowel sounds positive, no rebound, no ascites, no appreciable mass Extremities: No significant cyanosis, clubbing, or edema bilateral lower extremities  CBC: Recent Labs  Lab 04/04/22 1331 04/05/22 0511  WBC 21.3* 18.4*  NEUTROABS 18.0*  --   HGB 8.5* 7.9*  HCT 26.4* 24.4*  MCV 79.8* 78.7*  PLT 148* 127   Basic Metabolic Panel: Recent Labs  Lab 04/04/22 1331 04/05/22 0511  NA 139 139  K 4.5 4.0  CL 111 111  CO2 19* 23  GLUCOSE 139* 100*  BUN 64* 64*  CREATININE 4.76* 3.62*  CALCIUM 7.7* 7.5*   GFR: Estimated Creatinine Clearance: 18.4 mL/min (A) (by C-G formula based on SCr of 3.62 mg/dL (H)).   Scheduled Meds:  carbidopa-levodopa  1 tablet Oral QHS   carbidopa-levodopa  2 tablet Oral  2 times per day   And   carbidopa-levodopa  1 tablet Oral Daily   fludrocortisone  0.1 mg Oral Daily   glycopyrrolate  1 mg Oral BID   heparin  5,000 Units Subcutaneous Q8H   sertraline  25 mg Oral Daily   Continuous Infusions:  ceFEPime (MAXIPIME) IV     lactated ringers 100 mL/hr at 04/05/22 0301     LOS: 1 day   Cherene Altes, MD [facility] Office  [phone] Pager - Text Page per [name]  If 7PM-7AM, please contact night-coverage per Amion 04/05/2022, 9:20 AM

## 2022-04-06 DIAGNOSIS — A419 Sepsis, unspecified organism: Secondary | ICD-10-CM | POA: Diagnosis not present

## 2022-04-06 DIAGNOSIS — Z978 Presence of other specified devices: Secondary | ICD-10-CM | POA: Diagnosis not present

## 2022-04-06 DIAGNOSIS — N179 Acute kidney failure, unspecified: Secondary | ICD-10-CM | POA: Diagnosis not present

## 2022-04-06 DIAGNOSIS — R6521 Severe sepsis with septic shock: Secondary | ICD-10-CM | POA: Diagnosis not present

## 2022-04-06 DIAGNOSIS — R7881 Bacteremia: Secondary | ICD-10-CM | POA: Diagnosis not present

## 2022-04-06 DIAGNOSIS — R6520 Severe sepsis without septic shock: Secondary | ICD-10-CM | POA: Diagnosis not present

## 2022-04-06 LAB — BASIC METABOLIC PANEL
Anion gap: 4 — ABNORMAL LOW (ref 5–15)
BUN: 58 mg/dL — ABNORMAL HIGH (ref 8–23)
CO2: 23 mmol/L (ref 22–32)
Calcium: 7.9 mg/dL — ABNORMAL LOW (ref 8.9–10.3)
Chloride: 116 mmol/L — ABNORMAL HIGH (ref 98–111)
Creatinine, Ser: 2.76 mg/dL — ABNORMAL HIGH (ref 0.61–1.24)
GFR, Estimated: 23 mL/min — ABNORMAL LOW (ref >60)
Glucose, Bld: 98 mg/dL (ref 70–99)
Potassium: 4.1 mmol/L (ref 3.5–5.1)
Sodium: 143 mmol/L (ref 135–145)

## 2022-04-06 LAB — CBC
HCT: 24.2 % — ABNORMAL LOW (ref 39.0–52.0)
Hemoglobin: 7.7 g/dL — ABNORMAL LOW (ref 13.0–17.0)
MCH: 25.3 pg — ABNORMAL LOW (ref 26.0–34.0)
MCHC: 31.8 g/dL (ref 30.0–36.0)
MCV: 79.6 fL — ABNORMAL LOW (ref 80.0–100.0)
Platelets: 157 10*3/uL (ref 150–400)
RBC: 3.04 MIL/uL — ABNORMAL LOW (ref 4.22–5.81)
RDW: 14.6 % (ref 11.5–15.5)
WBC: 9.5 10*3/uL (ref 4.0–10.5)
nRBC: 0 % (ref 0.0–0.2)

## 2022-04-06 LAB — RETICULOCYTES
RBC.: 3.12 MIL/uL — ABNORMAL LOW (ref 4.22–5.81)
Retic Ct Pct: <0.4 % — ABNORMAL LOW (ref 0.4–3.1)

## 2022-04-06 LAB — IRON AND TIBC
Iron: 48 ug/dL (ref 45–182)
Saturation Ratios: 32 % (ref 17.9–39.5)
TIBC: 150 ug/dL — ABNORMAL LOW (ref 250–450)
UIBC: 102 ug/dL

## 2022-04-06 LAB — FERRITIN: Ferritin: 166 ng/mL (ref 24–336)

## 2022-04-06 LAB — FOLATE: Folate: 6.4 ng/mL (ref >5.9)

## 2022-04-06 LAB — VITAMIN B12: Vitamin B-12: 680 pg/mL (ref 180–914)

## 2022-04-06 NOTE — Progress Notes (Signed)
Attempted to call pt's daughter, Raylyn Speckman, at [phone] for patient update. Call went to voicemail. Will re-attempt at later time.

## 2022-04-06 NOTE — Plan of Care (Signed)

## 2022-04-06 NOTE — Progress Notes (Signed)
[facility]  Date of Admission:  04/04/2022     Abx: 10/30-c ampicillin 10/29-c cefepime                                                            Assessment: 76 yo male with dementia, chronic foley catheter, admitted 10/29 from home for ?a few days foley being plugged with peri-catheter leakage/purulence,  found to meet severe sepsis criteria (aki/ams) and polymicrobial bacteremia   Unclear duration of sx but seems to be less than a week. The enterococcus is low burden, and I do suspect it to be urinary, in setting of obstructive foley catheter. So unless it grows again, I do not feel a tee is needed.   The urine cx is growing gnr as well, suspect enterobacter   He has no hardware/cardiac device/vascular catheter and he denies new focal joint/back pain at this time  --------- 10/31 assessment Continues to improve with mentation/energy 10/30 repeat bcx ngtd  Leukocytosis had resolved and aki improving    Plan: Continue abx coverage with ampicillin and cefepime F/u repeat blood cx from 10/30 If repeat bcx still grow enterococcus will need tee to r/o endocarditis If repeat blood cx negative could finish tx course with high dose amoxicillin for the enterococcus faecalis or with linezolid (14 days from 10/30) The enterobacter bacteremia tx would be 7 days starting 10/29 and based on recent urine cx from 10/6 could be with quinolone or bactrim Will continue to follow Discussed with primary team   I spent more than 35 minute reviewing data/chart, and coordinating care and >50% direct face to face time providing counseling/discussing diagnostics/treatment plan with patient    Principal Problem:   Sepsis ([place]) Active Problems:   Bacteremia   Chronic indwelling Foley catheter   No Known Allergies  Scheduled Meds:  carbidopa-levodopa  1 tablet Oral QHS   carbidopa-levodopa  2 tablet Oral 2 times per day   And   carbidopa-levodopa  1  tablet Oral Daily   Chlorhexidine Gluconate Cloth  6 each Topical Daily   fludrocortisone  0.1 mg Oral Daily   glycopyrrolate  1 mg Oral BID   heparin  5,000 Units Subcutaneous Q8H   sertraline  25 mg Oral Daily   Continuous Infusions:  ampicillin (OMNIPEN) IV 2 g (04/06/22 0554)   ceFEPime (MAXIPIME) IV Stopped (04/05/22 1554)   lactated ringers 75 mL/hr at 04/06/22 0546   PRN Meds:.acetaminophen, bisacodyl, ondansetron **OR** ondansetron (ZOFRAN) IV, polyethylene glycol   SUBJECTIVE: Doing well No n/v/diarrhea No other complaint Per nursing staff some mucoid leakage around foley catheter still  Continued improving mentation   Review of Systems: ROS All other ROS was negative, except mentioned above     OBJECTIVE: Vitals:   04/05/22 0541 04/05/22 1401 04/05/22 2131 04/06/22 0541  BP: 124/65 (!) 108/54 (!) 127/57 131/61  Pulse: 72 63 61 (!) 59  Resp: '16 17 18 18  '$ Temp: 97.9 F (36.6 C) (!) 97.5 F (36.4 C) 97.8 F (36.6 C) 97.7 F (36.5 C)  TempSrc:  Oral Oral Oral  SpO2: 100% 100% 100% 100%  Weight:      Height:       Body mass index is 22.45 kg/m.  Physical Exam  General/constitutional: no  distress, pleasant, conversant HEENT: Normocephalic, PER, Conj Clear, EOMI, Oropharynx clear Neck supple CV: rrr no mrg Lungs: clear to auscultation, normal respiratory effort Abd: Soft, Nontender Ext: no edema Skin: No Rash Neuro: ?chronic generalized weakness/paresis of all extremities; no clonus/tremor MSK: no peripheral joint swelling/tenderness/warmth Gu: foley catheter draining clear yellow urine  Lab Results Lab Results  Component Value Date   WBC 9.5 04/06/2022   HGB 7.7 (L) 04/06/2022   HCT 24.2 (L) 04/06/2022   MCV 79.6 (L) 04/06/2022   PLT 157 04/06/2022    Lab Results  Component Value Date   CREATININE 2.76 (H) 04/06/2022   BUN 58 (H) 04/06/2022   NA 143 04/06/2022   K 4.1 04/06/2022   CL 116 (H) 04/06/2022   CO2 23 04/06/2022    Lab  Results  Component Value Date   ALT 8 04/05/2022   AST 52 (H) 04/05/2022   ALKPHOS 73 04/05/2022   BILITOT 0.6 04/05/2022      Microbiology: Recent Results (from the past 240 hour(s))  Urine Culture     Status: Abnormal (Preliminary result)   Collection Time: 04/04/22  1:37 PM   Specimen: In/Out Cath Urine  Result Value Ref Range Status   Specimen Description   Final    IN/OUT CATH URINE Performed at [facility], [place] [address] [place], [place] [address]    Special Requests   Final    NONE Performed at [facility], [place] [address] [place], [place] [address]    Culture (A)  Final    >=100,000 COLONIES/mL GRAM NEGATIVE RODS CULTURE REINCUBATED FOR BETTER GROWTH IDENTIFICATION AND SUSCEPTIBILITIES TO FOLLOW Performed at [facility], [place] [address], [place], [place] [address]    Report Status PENDING  Incomplete  Blood Culture (routine x 2)     Status: None (Preliminary result)   Collection Time: 04/04/22  1:42 PM   Specimen: BLOOD  Result Value Ref Range Status   Specimen Description   Final    BLOOD LEFT ANTECUBITAL Performed at [facility] [address], [place], [place] [address]    Special Requests   Final    BOTTLES DRAWN AEROBIC AND ANAEROBIC Blood Culture results may not be optimal due to an inadequate volume of blood received in culture bottles Performed at [facility] [address], [place], [place] [address]    Culture  Setup Time   Final    GRAM NEGATIVE RODS GRAM POSITIVE COCCI IN CHAINS IN BOTH AEROBIC AND ANAEROBIC BOTTLES CRITICAL RESULT CALLED TO, READ BACK BY AND VERIFIED WITH: PHARMD D WOFFORD [id number] AT 1046 BY CM    Culture   Final    GRAM NEGATIVE RODS GRAM POSITIVE COCCI CULTURE REINCUBATED FOR BETTER GROWTH IDENTIFICATION AND SUSCEPTIBILITIES TO FOLLOW Performed at [facility], [place] [address] [place],  [address]    Report Status PENDING   Incomplete  Blood Culture ID Panel (Reflexed)     Status: Abnormal   Collection Time: 04/04/22  1:42 PM  Result Value Ref Range Status   Enterococcus faecalis DETECTED (A) NOT DETECTED Final    Comment: CRITICAL RESULT CALLED TO, READ BACK BY AND VERIFIED WITH: PHARMD D WOFFORD [id number] AT 1046 AM BY CM    Enterococcus Faecium NOT DETECTED NOT DETECTED Final   Listeria monocytogenes NOT DETECTED NOT DETECTED Final   Staphylococcus species NOT DETECTED NOT DETECTED Final   Staphylococcus aureus (BCID) NOT DETECTED NOT DETECTED Final  Staphylococcus epidermidis NOT DETECTED NOT DETECTED Final   Staphylococcus lugdunensis NOT DETECTED NOT DETECTED Final   Streptococcus species NOT DETECTED NOT DETECTED Final   Streptococcus agalactiae NOT DETECTED NOT DETECTED Final   Streptococcus pneumoniae NOT DETECTED NOT DETECTED Final   Streptococcus pyogenes NOT DETECTED NOT DETECTED Final   A.calcoaceticus-baumannii NOT DETECTED NOT DETECTED Final   Bacteroides fragilis NOT DETECTED NOT DETECTED Final   Enterobacterales DETECTED (A) NOT DETECTED Final    Comment: Enterobacterales represent a large order of gram negative bacteria, not a single organism. CRITICAL RESULT CALLED TO, READ BACK BY AND VERIFIED WITH: PHARMD D WOFFORD [id number] AT 1046 AM BY CM    Enterobacter cloacae complex DETECTED (A) NOT DETECTED Final    Comment: CRITICAL RESULT CALLED TO, READ BACK BY AND VERIFIED WITH: PHARMD D WOFFORD [id number] AT 1046 AM BY CM    Escherichia coli NOT DETECTED NOT DETECTED Final   Klebsiella aerogenes NOT DETECTED NOT DETECTED Final   Klebsiella oxytoca NOT DETECTED NOT DETECTED Final   Klebsiella pneumoniae NOT DETECTED NOT DETECTED Final   Proteus species NOT DETECTED NOT DETECTED Final   Salmonella species NOT DETECTED NOT DETECTED Final   Serratia marcescens NOT DETECTED NOT DETECTED Final   Haemophilus influenzae NOT DETECTED NOT DETECTED Final   Neisseria meningitidis NOT DETECTED NOT  DETECTED Final   Pseudomonas aeruginosa NOT DETECTED NOT DETECTED Final   Stenotrophomonas maltophilia NOT DETECTED NOT DETECTED Final   Candida albicans NOT DETECTED NOT DETECTED Final   Candida auris NOT DETECTED NOT DETECTED Final   Candida glabrata NOT DETECTED NOT DETECTED Final   Candida krusei NOT DETECTED NOT DETECTED Final   Candida parapsilosis NOT DETECTED NOT DETECTED Final   Candida tropicalis NOT DETECTED NOT DETECTED Final   Cryptococcus neoformans/gattii NOT DETECTED NOT DETECTED Final   CTX-M ESBL NOT DETECTED NOT DETECTED Final   Carbapenem resistance IMP NOT DETECTED NOT DETECTED Final   Carbapenem resistance KPC NOT DETECTED NOT DETECTED Final   Carbapenem resistance NDM NOT DETECTED NOT DETECTED Final   Carbapenem resist OXA 48 LIKE NOT DETECTED NOT DETECTED Final   Vancomycin resistance NOT DETECTED NOT DETECTED Final   Carbapenem resistance VIM NOT DETECTED NOT DETECTED Final    Comment: Performed at [facility], [address], [place], [place] [address]  Blood Culture (routine x 2)     Status: None (Preliminary result)   Collection Time: 04/04/22  1:45 PM   Specimen: BLOOD  Result Value Ref Range Status   Specimen Description   Final    BLOOD RIGHT ANTECUBITAL Performed at [facility] [address], [place], [place] [address]    Special Requests   Final    BOTTLES DRAWN AEROBIC AND ANAEROBIC Blood Culture adequate volume Performed at [facility] [address] [place], [place] [address]    Culture  Setup Time   Final    GRAM NEGATIVE RODS IN BOTH AEROBIC AND ANAEROBIC BOTTLES CRITICAL VALUE NOTED.  VALUE IS CONSISTENT WITH PREVIOUSLY REPORTED AND CALLED VALUE.    Culture   Final    GRAM NEGATIVE RODS IDENTIFICATION TO FOLLOW Performed at [facility], [place] [address] [place],  [address]    Report Status PENDING  Incomplete  Resp Panel by RT-PCR (Flu A&B, Covid)     Status: None    Collection Time: 04/04/22  4:22 PM   Specimen: Nasal Swab  Result Value Ref Range Status   SARS Coronavirus 2  by RT PCR NEGATIVE NEGATIVE Final    Comment: (NOTE) SARS-CoV-2 target nucleic acids are NOT DETECTED.  The SARS-CoV-2 RNA is generally detectable in upper respiratory specimens during the acute phase of infection. The lowest concentration of SARS-CoV-2 viral copies this assay can detect is 138 copies/mL. A negative result does not preclude SARS-Cov-2 infection and should not be used as the sole basis for treatment or other patient management decisions. A negative result may occur with  improper specimen collection/handling, submission of specimen other than nasopharyngeal swab, presence of viral mutation(s) within the areas targeted by this assay, and inadequate number of viral copies(<138 copies/mL). A negative result must be combined with clinical observations, patient history, and epidemiological information. The expected result is Negative.  Fact Sheet for Patients:  EntrepreneurPulse.com.au  Fact Sheet for Healthcare Providers:  IncredibleEmployment.be  This test is no t yet approved or cleared by the [place] FDA and  has been authorized for detection and/or diagnosis of SARS-CoV-2 by FDA under an Emergency Use Authorization (EUA). This EUA will remain  in effect (meaning this test can be used) for the duration of the COVID-19 declaration under Section 564(b)(1) of the Act, 21 U.S.C.section 360bbb-3(b)(1), unless the authorization is terminated  or revoked sooner.       Influenza A by PCR NEGATIVE NEGATIVE Final   Influenza B by PCR NEGATIVE NEGATIVE Final    Comment: (NOTE) The Xpert Xpress SARS-CoV-2/FLU/RSV plus assay is intended as an aid in the diagnosis of influenza from Nasopharyngeal swab specimens and should not be used as a sole basis for treatment. Nasal washings and aspirates are unacceptable for Xpert Xpress  SARS-CoV-2/FLU/RSV testing.  Fact Sheet for Patients: EntrepreneurPulse.com.au  Fact Sheet for Healthcare Providers: IncredibleEmployment.be  This test is not yet approved or cleared by the [place] FDA and has been authorized for detection and/or diagnosis of SARS-CoV-2 by FDA under an Emergency Use Authorization (EUA). This EUA will remain in effect (meaning this test can be used) for the duration of the COVID-19 declaration under Section 564(b)(1) of the Act, 21 U.S.C. section 360bbb-3(b)(1), unless the authorization is terminated or revoked.  Performed at [facility], [place] [address], [place], [place] [address]   Culture, blood (Routine X 2) w Reflex to ID Panel     Status: None (Preliminary result)   Collection Time: 04/05/22  2:48 PM   Specimen: BLOOD RIGHT ARM  Result Value Ref Range Status   Specimen Description BLOOD RIGHT ARM  Final   Special Requests   Final    BOTTLES DRAWN AEROBIC AND ANAEROBIC Blood Culture adequate volume   Culture   Final    NO GROWTH < 24 HOURS Performed at [facility], [place] [address], [place], [place] [address]    Report Status PENDING  Incomplete  Culture, blood (Routine X 2) w Reflex to ID Panel     Status: None (Preliminary result)   Collection Time: 04/05/22  2:53 PM   Specimen: BLOOD LEFT ARM  Result Value Ref Range Status   Specimen Description BLOOD LEFT ARM  Final   Special Requests   Final    BOTTLES DRAWN AEROBIC ONLY Blood Culture results may not be optimal due to an inadequate volume of blood received in culture bottles   Culture   Final    NO GROWTH < 24 HOURS Performed at [facility], [place] [address] [place], [place] [address]    Report Status PENDING  Incomplete  Serology:   Imaging: If present, new imagings (plain films, ct scans, and mri) have been personally visualized and interpreted; radiology reports have been reviewed. Decision making  incorporated into the Impression / Recommendations.   Jabier Mutton, [facility] [place] [phone] pager    04/06/2022, 11:39 AM

## 2022-04-06 NOTE — Progress Notes (Addendum)
Theodore King  ONG:[id number] DOB: [birth year] DOA: 04/04/2022 PCP: [name], MD    Brief Narrative:  76 year old with a history of Parkinson's disease with associated dementia, paralysis agitans, HLD, thrombocytopenia, and BPH w/ chronic urinary retention who was sent to the ER via EMS after he was found to be leaking urine around his Foley catheter.  Upon arrival in the ER it was noted that the urine in his Foley catheter was turbulent and foul-smelling.  He was found to have a WBC of 21.3 and a lactate elevated at 2.4.  His Foley was noted to be occluded and was changed in the ER, resulting in the drainage of approximately 800 cc of urine.  He was also found to be suffering with acute kidney failure with a creatinine of 4.76 at presentation compared to a baseline of 1.35.  Consultants:  ID  Goals of Care:  Code Status: Full Code   DVT prophylaxis: Subcutaneous heparin  Interim Hx: Afebrile.  Vital signs stable.  Renal function continues to slowly improve.  WBC normalized.  Repeat blood cultures 10/30 thus far without growth.  In good spirits at the time of my visit.  Has no new complaints.  Tells me his appetite is improving nicely.  Denies chest pain or shortness of breath.  Assessment & Plan:  Acute kidney failure Likely due to occluded Foley leading to effective urinary retention - creatinine trending downward after obstruction relieved - continue to monitor with ongoing supportive care   Acute urinary retention - occluded chronic Foley catheter POA Foley catheter was changed in the ER relieving approximately 800 cc of retained urine - catheter likely became obstructed due to heavy sediment due to UTI - monitor new Foley to assure it does not also become obstructed -consistent output thus far   Sepsis due to gram-negative rod UTI due to foley catheter POA  Met sepsis criteria with respiratory rate 22, WBC of 21.3, and obvious bacterial infection - empiric Rocephin therapy  - sepsis physiology has now resolved with patient stabilizing hemodynamically  Enterococcus and gram-negative rod bacteremia ID following -on appropriate antibiotics - repeat blood cultures pending from 10/30 and if positive again will require SBE evaluation -otherwise antibiotic recommendations have been laid out by ID   Lactic acidosis Due to above - improved with hydration   Advanced Parkinson's disease with severe dementia Lives in a private home with family in the [place] area - continue usual sinemet dosing   Chronic thrombocytopenia Platelet count appears to be stable compared to baseline   Microcytic anemia Likely due to smoldering urinary tract infection - no evidence of acute blood loss - anemia panel consistent with anemia of poor nutrition/chronic disease - G40 and folic acid are not low- monitor trend  Family Communication: no family present at time of exam  Disposition: unclear at this time - lives w/ family - probable return to prior living arrangement - PT/OT to eval    Objective: Blood pressure 131/61, pulse (!) 59, temperature 97.7 F (36.5 C), temperature source Oral, resp. rate 18, height _0  (1.854 m), weight 77.2 kg, SpO2 100 %.  Intake/Output Summary (Last 24 hours) at 04/06/2022 0951 Last data filed at 04/06/2022 0549 Gross per 24 hour  Intake 1292.59 ml  Output 2625 ml  Net -1332.41 ml    Filed Weights   04/04/22 1500  Weight: 77.2 kg    Examination: General: No acute respiratory distress Lungs: Clear to auscultation bilaterally - no wheeze  Cardiovascular: Regular rate  and rhythm without murmur  Abdomen: Nontender, nondistended, soft, bowel sounds positive, no rebound, no ascites, no appreciable mass Extremities: No significant edema bilateral lower extremities  CBC: Recent Labs  Lab 04/04/22 1331 04/05/22 0511 04/06/22 0529  WBC 21.3* 18.4* 9.5  NEUTROABS 18.0*  --   --   HGB 8.5* 7.9* 7.7*  HCT 26.4* 24.4* 24.2*  MCV 79.8* 78.7*  79.6*  PLT 148* 153 539    Basic Metabolic Panel: Recent Labs  Lab 04/04/22 1331 04/05/22 0511 04/06/22 0529  NA 139 139 143  K 4.5 4.0 4.1  CL 111 111 116*  CO2 19* 23 23  GLUCOSE 139* 100* 98  BUN 64* 64* 58*  CREATININE 4.76* 3.62* 2.76*  CALCIUM 7.7* 7.5* 7.9*    GFR: Estimated Creatinine Clearance: 24.1 mL/min (A) (by C-G formula based on SCr of 2.76 mg/dL (H)).   Scheduled Meds:  carbidopa-levodopa  1 tablet Oral QHS   carbidopa-levodopa  2 tablet Oral 2 times per day   And   carbidopa-levodopa  1 tablet Oral Daily   Chlorhexidine Gluconate Cloth  6 each Topical Daily   fludrocortisone  0.1 mg Oral Daily   glycopyrrolate  1 mg Oral BID   heparin  5,000 Units Subcutaneous Q8H   sertraline  25 mg Oral Daily   Continuous Infusions:  ampicillin (OMNIPEN) IV 2 g (04/06/22 0554)   ceFEPime (MAXIPIME) IV Stopped (04/05/22 1554)   lactated ringers 75 mL/hr at 04/06/22 0546     LOS: 2 days   Cherene Altes, MD [facility] Office  [phone] Pager - Text Page per [name]  If 7PM-7AM, please contact night-coverage per Amion 04/06/2022, 9:51 AM

## 2022-04-07 DIAGNOSIS — Z978 Presence of other specified devices: Secondary | ICD-10-CM | POA: Diagnosis not present

## 2022-04-07 DIAGNOSIS — R7881 Bacteremia: Secondary | ICD-10-CM | POA: Diagnosis not present

## 2022-04-07 DIAGNOSIS — T83511D Infection and inflammatory reaction due to indwelling urethral catheter, subsequent encounter: Secondary | ICD-10-CM | POA: Diagnosis not present

## 2022-04-07 DIAGNOSIS — A419 Sepsis, unspecified organism: Secondary | ICD-10-CM | POA: Diagnosis not present

## 2022-04-07 DIAGNOSIS — R6520 Severe sepsis without septic shock: Secondary | ICD-10-CM | POA: Diagnosis not present

## 2022-04-07 LAB — BASIC METABOLIC PANEL
Anion gap: 4 — ABNORMAL LOW (ref 5–15)
BUN: 48 mg/dL — ABNORMAL HIGH (ref 8–23)
CO2: 23 mmol/L (ref 22–32)
Calcium: 8.1 mg/dL — ABNORMAL LOW (ref 8.9–10.3)
Chloride: 117 mmol/L — ABNORMAL HIGH (ref 98–111)
Creatinine, Ser: 2.34 mg/dL — ABNORMAL HIGH (ref 0.61–1.24)
GFR, Estimated: 28 mL/min — ABNORMAL LOW (ref >60)
Glucose, Bld: 105 mg/dL — ABNORMAL HIGH (ref 70–99)
Potassium: 4.2 mmol/L (ref 3.5–5.1)
Sodium: 144 mmol/L (ref 135–145)

## 2022-04-07 LAB — CBC
HCT: 25.8 % — ABNORMAL LOW (ref 39.0–52.0)
Hemoglobin: 8 g/dL — ABNORMAL LOW (ref 13.0–17.0)
MCH: 25.3 pg — ABNORMAL LOW (ref 26.0–34.0)
MCHC: 31 g/dL (ref 30.0–36.0)
MCV: 81.6 fL (ref 80.0–100.0)
Platelets: 155 10*3/uL (ref 150–400)
RBC: 3.16 MIL/uL — ABNORMAL LOW (ref 4.22–5.81)
RDW: 14.5 % (ref 11.5–15.5)
WBC: 6.3 10*3/uL (ref 4.0–10.5)
nRBC: 0 % (ref 0.0–0.2)

## 2022-04-07 LAB — CARBAPENEM RESISTANCE PANEL
Carba Resistance IMP Gene: NOT DETECTED
Carba Resistance KPC Gene: NOT DETECTED
Carba Resistance NDM Gene: NOT DETECTED
Carba Resistance OXA48 Gene: NOT DETECTED
Carba Resistance VIM Gene: NOT DETECTED

## 2022-04-07 MED ORDER — SODIUM CHLORIDE 0.9 % IV SOLN
1.0000 g | Freq: Two times a day (BID) | INTRAVENOUS | Status: DC
  Administered 2022-04-07 – 2022-04-09 (×5): 1 g via INTRAVENOUS
  Filled 2022-04-07 (×5): qty 20

## 2022-04-07 NOTE — Care Management Important Message (Signed)
Important Message  Patient Details IM Letter placed in Patient's room. Name: Theodore King MRN: [id number] Date of Birth: [birth year]   Medicare Important Message Given:  Yes     Kerin Salen 04/07/2022, 12:00 PM

## 2022-04-07 NOTE — Progress Notes (Signed)
  Transition of Care [facility]) Screening Note   Patient Details  Name: Theodore King Date of Birth: 1946/04/03   Transition of Care [facility]) CM/SW Contact:    Vassie Moselle, LCSW Phone Number: 04/07/2022, 8:15 AM    Transition of Care Department [facility]) has reviewed patient and no TOC needs have been identified at this time. We will continue to monitor patient advancement through interdisciplinary progression rounds. If new patient transition needs arise, please place a TOC consult.

## 2022-04-07 NOTE — Progress Notes (Signed)
PROGRESS NOTE    BRANDIS MATSUURA  BMW:[id number] DOB: [birth year] DOA: 04/04/2022 PCP: [name], MD    Brief Narrative:  76 year old gentleman with history of Parkinson's and dementia, hyperlipidemia, thrombocytopenia, BPH and chronic urinary retention with indwelling Foley catheter, recurrent hospitalization due to UTI brought to the emergency room with lethargy, Foley catheter with foul-smelling urine.  In the emergency room WBC count 21.3, lactic acid 2.4.  Foley was occluded, exchanged and found to have 800 mL of urine retention.  He was also noted to have acute kidney injury with creatinine of 4.76 at presentation compared to baseline of 1.35 recent.  Admitted with acute UTI, sepsis, AKI and malfunctioning Foley catheter.   Assessment & Plan:   Sepsis due to gram-negative rod, acute UTI present on admission secondary to Foley catheter Enterococcus and gram-negative rod bacteremia Sepsis present on admission due to AKI, leukocytosis.  Foley catheter exchanged and now draining well. Urine culture with Enterococcus cloacea and faecalis Blood cultures 10/29, Enterococcus faecalis and Enterococcus cloacea Blood cultures 10/30, negative so far. Patient remains on ampicillin and cefepime pending final cultures and followed by ID.  Clinically improving. Given clinical presentation, likely has prostatitis and will need prolonged antibiotic therapy.  Due to negative repeat cultures, not needing TEE.  Acute urinary retention, multifactorial acute kidney injury with underlying chronic kidney disease, malfunctioning Foley catheter: Maintenance IV fluid today.  Recheck levels tomorrow.  Advanced Parkinson's with dementia: On Sinemet.  Fall precautions.  Work with PT OT today.  Lives at home and has adequate support system at home.   DVT prophylaxis: heparin injection 5,000 Units Start: 04/04/22 2200   Code Status: Full code Family Communication: Daughter on the phone Disposition  Plan: Status is: Inpatient Remains inpatient appropriate because: IV antibiotics, bacteremia, IV fluids     Consultants:  ID  Procedures:  None  Antimicrobials:  Cefepime and ampicillin 10/29---   Subjective: Patient seen and examined.  Poor historian.  Looks comfortable.  Denies any complaints.  Eating breakfast by himself. Nursing reported milky discharge surrounding the Foley catheter.  Afebrile overnight.  Objective: Vitals:   04/06/22 0541 04/06/22 1440 04/06/22 2044 04/07/22 0423  BP: 131/61 (!) 140/69 (!) 149/72 (!) 147/73  Pulse: (!) 59 62 64 64  Resp: '18 20 18 18  '$ Temp: 97.7 F (36.5 C) 98.2 F (36.8 C) 97.8 F (36.6 C) 98 F (36.7 C)  TempSrc: Oral Oral Oral Oral  SpO2: 100% 100% 100% 100%  Weight:      Height:        Intake/Output Summary (Last 24 hours) at 04/07/2022 1114 Last data filed at 04/07/2022 1011 Gross per 24 hour  Intake 1508.75 ml  Output 2450 ml  Net -941.25 ml   Filed Weights   04/04/22 1500  Weight: 77.2 kg    Examination:  General: Looks fairly comfortable.  On room air. Patient is alert and awake.  Oriented x2-3.  He knows he is in the hospital.  Knows about the situation and family.  Not oriented to time. Cardiovascular: S1-S2 normal.  Regular rate rhythm. Respiratory: Bilateral clear.  No added sounds. Gastrointestinal: Soft.  Nontender.  Bowel sound present. foley catheter with clear urine.  Creamy discharge surrounding the Foley catheter. Ext: No deformities.      Data Reviewed: I have personally reviewed following labs and imaging studies  CBC: Recent Labs  Lab 04/04/22 1331 04/05/22 0511 04/06/22 0529 04/07/22 0516  WBC 21.3* 18.4* 9.5 6.3  NEUTROABS 18.0*  --   --   --  HGB 8.5* 7.9* 7.7* 8.0*  HCT 26.4* 24.4* 24.2* 25.8*  MCV 79.8* 78.7* 79.6* 81.6  PLT 148* 153 157 376   Basic Metabolic Panel: Recent Labs  Lab 04/04/22 1331 04/05/22 0511 04/06/22 0529 04/07/22 0516  NA 139 139 143 144  K 4.5 4.0  4.1 4.2  CL 111 111 116* 117*  CO2 19* '23 23 23  '$ GLUCOSE 139* 100* 98 105*  BUN 64* 64* 58* 48*  CREATININE 4.76* 3.62* 2.76* 2.34*  CALCIUM 7.7* 7.5* 7.9* 8.1*   GFR: Estimated Creatinine Clearance: 28.4 mL/min (A) (by C-G formula based on SCr of 2.34 mg/dL (H)). Liver Function Tests: Recent Labs  Lab 04/05/22 0511  AST 52*  ALT 8  ALKPHOS 73  BILITOT 0.6  PROT 5.7*  ALBUMIN 2.0*   No results for input(s): "LIPASE", "AMYLASE" in the last 168 hours. No results for input(s): "AMMONIA" in the last 168 hours. Coagulation Profile: Recent Labs  Lab 04/04/22 1412  INR 1.3*   Cardiac Enzymes: No results for input(s): "CKTOTAL", "CKMB", "CKMBINDEX", "TROPONINI" in the last 168 hours. BNP (last 3 results) No results for input(s): "PROBNP" in the last 8760 hours. HbA1C: No results for input(s): "HGBA1C" in the last 72 hours. CBG: No results for input(s): "GLUCAP" in the last 168 hours. Lipid Profile: No results for input(s): "CHOL", "HDL", "LDLCALC", "TRIG", "CHOLHDL", "LDLDIRECT" in the last 72 hours. Thyroid Function Tests: No results for input(s): "TSH", "T4TOTAL", "FREET4", "T3FREE", "THYROIDAB" in the last 72 hours. Anemia Panel: Recent Labs    04/06/22 0529  VITAMINB12 680  FOLATE 6.4  FERRITIN 166  TIBC 150*  IRON 48  RETICCTPCT <0.4*   Sepsis Labs: Recent Labs  Lab 04/04/22 1345 04/04/22 1537  LATICACIDVEN 2.4* 2.2*    Recent Results (from the past 240 hour(s))  Urine Culture     Status: Abnormal   Collection Time: 04/04/22  1:37 PM   Specimen: In/Out Cath Urine  Result Value Ref Range Status   Specimen Description   Final    IN/OUT CATH URINE Performed at [facility] [address] [place], [place] [address]    Special Requests   Final    NONE Performed at [facility], [place] [address] [place], [place] [address]    Culture (A)  Final    >=100,000 COLONIES/mL ENTEROBACTER CLOACAE 80,000 COLONIES/mL  ENTEROCOCCUS FAECALIS    Report Status 04/07/2022 FINAL  Final   Organism ID, Bacteria ENTEROBACTER CLOACAE (A)  Final   Organism ID, Bacteria ENTEROCOCCUS FAECALIS (A)  Final      Susceptibility   Enterobacter cloacae - MIC*    CEFAZOLIN >=64 RESISTANT Resistant     CEFEPIME 8 INTERMEDIATE Intermediate     CIPROFLOXACIN <=0.25 SENSITIVE Sensitive     GENTAMICIN <=1 SENSITIVE Sensitive     IMIPENEM 1 SENSITIVE Sensitive     NITROFURANTOIN <=16 SENSITIVE Sensitive     TRIMETH/SULFA <=20 SENSITIVE Sensitive     PIP/TAZO >=128 RESISTANT Resistant     * >=100,000 COLONIES/mL ENTEROBACTER CLOACAE   Enterococcus faecalis - MIC*    AMPICILLIN <=2 SENSITIVE Sensitive     NITROFURANTOIN <=16 SENSITIVE Sensitive     VANCOMYCIN 2 SENSITIVE Sensitive     * 80,000 COLONIES/mL ENTEROCOCCUS FAECALIS  Blood Culture (routine x 2)     Status: Abnormal (Preliminary result)   Collection Time: 04/04/22  1:42 PM   Specimen: BLOOD  Result Value Ref Range Status   Specimen Description BLOOD LEFT ANTECUBITAL  Final  Special Requests   Final    BOTTLES DRAWN AEROBIC AND ANAEROBIC Blood Culture results may not be optimal due to an inadequate volume of blood received in culture bottles   Culture  Setup Time   Final    GRAM NEGATIVE RODS GRAM POSITIVE COCCI IN CHAINS IN BOTH AEROBIC AND ANAEROBIC BOTTLES CRITICAL RESULT CALLED TO, READ BACK BY AND VERIFIED WITH: PHARMD D WOFFORD [id number] AT 1046 BY CM    Culture (A)  Final    ENTEROCOCCUS FAECALIS ENTEROBACTER CLOACAE REISOLATING ENTEROBACTER CLOACAE    Report Status PENDING  Incomplete   Organism ID, Bacteria ENTEROCOCCUS FAECALIS  Final      Susceptibility   Enterococcus faecalis - MIC*    AMPICILLIN <=2 SENSITIVE Sensitive     VANCOMYCIN 2 SENSITIVE Sensitive     GENTAMICIN SYNERGY Value in next row Sensitive      SENSITIVEPerformed at [facility] [address], [place], [place] [address]    * ENTEROCOCCUS FAECALIS  Blood Culture ID  Panel (Reflexed)     Status: Abnormal   Collection Time: 04/04/22  1:42 PM  Result Value Ref Range Status   Enterococcus faecalis DETECTED (A) NOT DETECTED Final    Comment: CRITICAL RESULT CALLED TO, READ BACK BY AND VERIFIED WITH: PHARMD D WOFFORD [id number] AT 1046 AM BY CM    Enterococcus Faecium NOT DETECTED NOT DETECTED Final   Listeria monocytogenes NOT DETECTED NOT DETECTED Final   Staphylococcus species NOT DETECTED NOT DETECTED Final   Staphylococcus aureus (BCID) NOT DETECTED NOT DETECTED Final   Staphylococcus epidermidis NOT DETECTED NOT DETECTED Final   Staphylococcus lugdunensis NOT DETECTED NOT DETECTED Final   Streptococcus species NOT DETECTED NOT DETECTED Final   Streptococcus agalactiae NOT DETECTED NOT DETECTED Final   Streptococcus pneumoniae NOT DETECTED NOT DETECTED Final   Streptococcus pyogenes NOT DETECTED NOT DETECTED Final   A.calcoaceticus-baumannii NOT DETECTED NOT DETECTED Final   Bacteroides fragilis NOT DETECTED NOT DETECTED Final   Enterobacterales DETECTED (A) NOT DETECTED Final    Comment: Enterobacterales represent a large order of gram negative bacteria, not a single organism. CRITICAL RESULT CALLED TO, READ BACK BY AND VERIFIED WITH: PHARMD D WOFFORD [id number] AT 1046 AM BY CM    Enterobacter cloacae complex DETECTED (A) NOT DETECTED Final    Comment: CRITICAL RESULT CALLED TO, READ BACK BY AND VERIFIED WITH: PHARMD D WOFFORD [id number] AT 1046 AM BY CM    Escherichia coli NOT DETECTED NOT DETECTED Final   Klebsiella aerogenes NOT DETECTED NOT DETECTED Final   Klebsiella oxytoca NOT DETECTED NOT DETECTED Final   Klebsiella pneumoniae NOT DETECTED NOT DETECTED Final   Proteus species NOT DETECTED NOT DETECTED Final   Salmonella species NOT DETECTED NOT DETECTED Final   Serratia marcescens NOT DETECTED NOT DETECTED Final   Haemophilus influenzae NOT DETECTED NOT DETECTED Final   Neisseria meningitidis NOT DETECTED NOT DETECTED Final   Pseudomonas  aeruginosa NOT DETECTED NOT DETECTED Final   Stenotrophomonas maltophilia NOT DETECTED NOT DETECTED Final   Candida albicans NOT DETECTED NOT DETECTED Final   Candida auris NOT DETECTED NOT DETECTED Final   Candida glabrata NOT DETECTED NOT DETECTED Final   Candida krusei NOT DETECTED NOT DETECTED Final   Candida parapsilosis NOT DETECTED NOT DETECTED Final   Candida tropicalis NOT DETECTED NOT DETECTED Final   Cryptococcus neoformans/gattii NOT DETECTED NOT DETECTED Final   CTX-M ESBL NOT DETECTED NOT DETECTED Final   Carbapenem resistance IMP NOT DETECTED NOT DETECTED Final  Carbapenem resistance KPC NOT DETECTED NOT DETECTED Final   Carbapenem resistance NDM NOT DETECTED NOT DETECTED Final   Carbapenem resist OXA 48 LIKE NOT DETECTED NOT DETECTED Final   Vancomycin resistance NOT DETECTED NOT DETECTED Final   Carbapenem resistance VIM NOT DETECTED NOT DETECTED Final    Comment: Performed at [facility], [place] [address], [place], [place] [address]  Blood Culture (routine x 2)     Status: Abnormal (Preliminary result)   Collection Time: 04/04/22  1:45 PM   Specimen: BLOOD  Result Value Ref Range Status   Specimen Description   Final    BLOOD RIGHT ANTECUBITAL Performed at [facility] [address] [place], [place] [address]    Special Requests   Final    BOTTLES DRAWN AEROBIC AND ANAEROBIC Blood Culture adequate volume Performed at [facility] [address], [place], [place] [address]    Culture  Setup Time   Final    GRAM NEGATIVE RODS IN BOTH AEROBIC AND ANAEROBIC BOTTLES CRITICAL VALUE NOTED.  VALUE IS CONSISTENT WITH PREVIOUSLY REPORTED AND CALLED VALUE. Performed at [facility], [place] [address] [place], [place] [address]    Culture ENTEROBACTER CLOACAE (A)  Final   Report Status PENDING  Incomplete  Carbapenem Resistance Panel     Status: None   Collection Time: 04/04/22  4:00 PM  Result Value Ref Range Status    Carba Resistance IMP Gene NOT DETECTED NOT DETECTED Final   Carba Resistance VIM Gene NOT DETECTED NOT DETECTED Final   Carba Resistance NDM Gene NOT DETECTED NOT DETECTED Final   Carba Resistance KPC Gene NOT DETECTED NOT DETECTED Final   Carba Resistance OXA48 Gene NOT DETECTED NOT DETECTED Final    Comment: (NOTE) Cepheid Carba-R is an FDA-cleared nucleic acid amplification test  (NAAT)for the detection and differentiation of genes encoding the  most prevalent carbapenemases in bacterial isolate samples. Carbapenemase gene identification and implementation of comprehensive  infection control measures are recommended by the CDC to prevent the  spread of the resistant organisms. Performed at [facility], [place] [address], [place], [place] [address]   Resp Panel by RT-PCR (Flu A&B, Covid)     Status: None   Collection Time: 04/04/22  4:22 PM   Specimen: Nasal Swab  Result Value Ref Range Status   SARS Coronavirus 2 by RT PCR NEGATIVE NEGATIVE Final    Comment: (NOTE) SARS-CoV-2 target nucleic acids are NOT DETECTED.  The SARS-CoV-2 RNA is generally detectable in upper respiratory specimens during the acute phase of infection. The lowest concentration of SARS-CoV-2 viral copies this assay can detect is 138 copies/mL. A negative result does not preclude SARS-Cov-2 infection and should not be used as the sole basis for treatment or other patient management decisions. A negative result may occur with  improper specimen collection/handling, submission of specimen other than nasopharyngeal swab, presence of viral mutation(s) within the areas targeted by this assay, and inadequate number of viral copies(<138 copies/mL). A negative result must be combined with clinical observations, patient history, and epidemiological information. The expected result is Negative.  Fact Sheet for Patients:  EntrepreneurPulse.com.au  Fact Sheet for Healthcare Providers:   IncredibleEmployment.be  This test is no t yet approved or cleared by the [place] FDA and  has been authorized for detection and/or diagnosis of SARS-CoV-2 by FDA under an Emergency Use Authorization (EUA). This EUA will remain  in effect (meaning this test can be used)  for the duration of the COVID-19 declaration under Section 564(b)(1) of the Act, 21 U.S.C.section 360bbb-3(b)(1), unless the authorization is terminated  or revoked sooner.       Influenza A by PCR NEGATIVE NEGATIVE Final   Influenza B by PCR NEGATIVE NEGATIVE Final    Comment: (NOTE) The Xpert Xpress SARS-CoV-2/FLU/RSV plus assay is intended as an aid in the diagnosis of influenza from Nasopharyngeal swab specimens and should not be used as a sole basis for treatment. Nasal washings and aspirates are unacceptable for Xpert Xpress SARS-CoV-2/FLU/RSV testing.  Fact Sheet for Patients: EntrepreneurPulse.com.au  Fact Sheet for Healthcare Providers: IncredibleEmployment.be  This test is not yet approved or cleared by the [place] FDA and has been authorized for detection and/or diagnosis of SARS-CoV-2 by FDA under an Emergency Use Authorization (EUA). This EUA will remain in effect (meaning this test can be used) for the duration of the COVID-19 declaration under Section 564(b)(1) of the Act, 21 U.S.C. section 360bbb-3(b)(1), unless the authorization is terminated or revoked.  Performed at [facility], [place] [address], [place], [place] [address]   Culture, blood (Routine X 2) w Reflex to ID Panel     Status: None (Preliminary result)   Collection Time: 04/05/22  2:48 PM   Specimen: BLOOD RIGHT ARM  Result Value Ref Range Status   Specimen Description BLOOD RIGHT ARM  Final   Special Requests   Final    BOTTLES DRAWN AEROBIC AND ANAEROBIC Blood Culture adequate volume   Culture   Final    NO GROWTH 2 DAYS Performed at [facility], [address], [place], [place] [address]    Report Status PENDING  Incomplete  Culture, blood (Routine X 2) w Reflex to ID Panel     Status: None (Preliminary result)   Collection Time: 04/05/22  2:53 PM   Specimen: BLOOD LEFT ARM  Result Value Ref Range Status   Specimen Description BLOOD LEFT ARM  Final   Special Requests   Final    BOTTLES DRAWN AEROBIC ONLY Blood Culture results may not be optimal due to an inadequate volume of blood received in culture bottles   Culture   Final    NO GROWTH 2 DAYS Performed at [facility], [place] [address], [place],  [address]    Report Status PENDING  Incomplete         Radiology Studies: No results found.      Scheduled Meds:  carbidopa-levodopa  1 tablet Oral QHS   carbidopa-levodopa  2 tablet Oral 2 times per day   And   carbidopa-levodopa  1 tablet Oral Daily   Chlorhexidine Gluconate Cloth  6 each Topical Daily   fludrocortisone  0.1 mg Oral Daily   glycopyrrolate  1 mg Oral BID   heparin  5,000 Units Subcutaneous Q8H   sertraline  25 mg Oral Daily   Continuous Infusions:  ampicillin (OMNIPEN) IV 2 g (04/07/22 0538)   ceFEPime (MAXIPIME) IV 2 g (04/06/22 1410)   lactated ringers 50 mL/hr at 04/07/22 0159     LOS: 3 days    Time spent: 35 minutes    Barb Merino, MD [facility] Pager [phone]

## 2022-04-07 NOTE — Evaluation (Signed)
Physical Therapy Evaluation Patient Details Name: Theodore King MRN: [id number] DOB: August 11, 1945 Today's Date: 04/07/2022  History of Present Illness  76 year old gentleman with history of Parkinson's and dementia, hyperlipidemia, thrombocytopenia, covid/pna, BPPV,  BPH and chronic urinary retention with indwelling Foley catheter. recurrent hospitalization due to UTI brought to the emergency room with lethargy, Foley catheter with foul-smelling urine. admitted with UTI, sepsis, AKI and malfunctioning Foley catheter.  Clinical Impression  Pt admitted with above diagnosis.  Pt is cooperative with PT, able to stand and perform pre-gait activities. He is an unreliable historian however from previous PT notes he was able to amb with a cane and no assist. Currently weaker than his baseline and may need SNF unless family able to provide support; if able to return home with family would benefit from, [place].  Pt currently with functional limitations due to the deficits listed below (see PT Problem List). Pt will benefit from skilled PT to increase their independence and safety with mobility to allow discharge to the venue listed below.          Recommendations for follow up therapy are one component of a multi-disciplinary discharge planning process, led by the attending physician.  Recommendations may be updated based on patient status, additional functional criteria and insurance authorization.  Follow Up Recommendations Home health PT (if 24hour care, if not may need SNF) Can patient physically be transported by private vehicle: Yes    Assistance Recommended at Discharge Frequent or constant Supervision/Assistance  Patient can return home with the following  A lot of help with walking and/or transfers;A lot of help with bathing/dressing/bathroom;Assist for transportation;Help with stairs or ramp for entrance    Equipment Recommendations None recommended by PT  Recommendations for Other  Services       Functional Status Assessment Patient has had a recent decline in their functional status and demonstrates the ability to make significant improvements in function in a reasonable and predictable amount of time.     Precautions / Restrictions Precautions Precautions: Fall Restrictions Weight Bearing Restrictions: No      Mobility  Bed Mobility Overal bed mobility: Needs Assistance Bed Mobility: Supine to Sit     Supine to sit: Mod assist, Min assist     General bed mobility comments: assist to elevate trunk and progress LEs off bed.  with return to supine assist to bring LEs on to bed    Transfers Overall transfer level: Needs assistance Equipment used: Rolling walker (2 wheels) Transfers: Sit to/from Stand Sit to Stand: Min assist           General transfer comment: STS x4 using back of recliner to come to full trunk/hip/knee extension, rocking/momentum used to stand. posterior bias on initial standing, improved with repeated trials    Ambulation/Gait             Pre-gait activities: lateral wt shifting, marching in place x5 bil LEs with chair back support    Stairs            Wheelchair Mobility    Modified Rankin (Stroke Patients Only)       Balance Overall balance assessment: Needs assistance Sitting-balance support: Feet supported, Single extremity supported, No upper extremity supported Sitting balance-Leahy Scale: Fair     Standing balance support: During functional activity, Bilateral upper extremity supported Standing balance-Leahy Scale: Poor Standing balance comment: reliant on UEs  Pertinent Vitals/Pain Pain Assessment Pain Assessment: No/denies pain    Home Living Family/patient expects to be discharged to:: Private residence Living Arrangements: Children Available Help at Discharge: Family;Available PRN/intermittently Type of Home: House Home Access: Stairs to  enter Entrance Stairs-Rails: Psychiatric nurse of Steps: 3   Home Layout: One level Home Equipment: Conservation officer, nature (2 wheels);Cane - single point;Grab bars - tub/shower;BSC/3in1 Additional Comments: daughter works during day. Pt goes to 2-3 days a week [facility] 8 hours/wk. (info above from previous admission)    Prior Function Prior Level of Function : Independent/Modified Independent;Needs assist             Mobility Comments: pt unable to provide info, previously able to amb ADLs Comments: Assist with bathing, dressing assist; able to feed self and perform grooming--per previous notes     Hand Dominance        Extremity/Trunk Assessment   Upper Extremity Assessment Upper Extremity Assessment: Overall WFL for tasks assessed    Lower Extremity Assessment Lower Extremity Assessment: Overall WFL for tasks assessed       Communication   Communication: No difficulties  Cognition Arousal/Alertness: Awake/alert Behavior During Therapy: WFL for tasks assessed/performed Overall Cognitive Status: Impaired/Different from baseline                                          General Comments      Exercises     Assessment/Plan    PT Assessment Patient needs continued PT services  PT Problem List Decreased strength;Decreased mobility;Decreased activity tolerance;Decreased balance;Decreased knowledge of use of DME       PT Treatment Interventions DME instruction;Therapeutic exercise;Gait training;Functional mobility training;Therapeutic activities;Patient/family education;Balance training    PT Goals (Current goals can be found in the Care Plan section)  Acute Rehab PT Goals PT Goal Formulation: Patient unable to participate in goal setting Time For Goal Achievement: 04/21/22 Potential to Achieve Goals: Good    Frequency       Co-evaluation               AM-PAC PT "6 Clicks" Mobility  Outcome Measure Help needed turning from your  back to your side while in a flat bed without using bedrails?: A Lot Help needed moving from lying on your back to sitting on the side of a flat bed without using bedrails?: A Lot Help needed moving to and from a bed to a chair (including a wheelchair)?: A Lot Help needed standing up from a chair using your arms (e.g., wheelchair or bedside chair)?: A Lot Help needed to walk in hospital room?: A Lot Help needed climbing 3-5 steps with a railing? : Total 6 Click Score: 11    End of Session Equipment Utilized During Treatment: Gait belt Activity Tolerance: Patient tolerated treatment well Patient left: with call bell/phone within reach;in bed;with bed alarm set;with nursing/sitter in room Nurse Communication: Mobility status PT Visit Diagnosis: Other abnormalities of gait and mobility (R26.89);Difficulty in walking, not elsewhere classified (R26.2)    Time: 8546-2703 PT Time Calculation (min) (ACUTE ONLY): 24 min   Charges:   PT Evaluation $PT Eval Low Complexity: 1 Low PT Treatments $Therapeutic Activity: 8-22 mins        ,t   [facility] 04/07/2022, 5:06 PM

## 2022-04-07 NOTE — Progress Notes (Signed)
Patient is alert to self, WC bound at base, able to turn self in bed. Patient has chronic foley in place, penis is swollen and has a milky discharge. Skin remains intact, sacral foam is in place.

## 2022-04-07 NOTE — Progress Notes (Addendum)
[facility]    Date of Admission:  04/04/2022   Total days of antibiotics: 3 (amp/cefepime)           Assessment: UTI Sepsis Polymicrobial bacteremia (E faecalis, Enterobacter cloacae) Dementia Chronic Foley AKI  Plan: WBC improved AKI improved (GFR now 28) Sensi pending Repeat BCx 10-30 are ngtd.  Hold on TEE.   Thank you so much for this interesting consult,  Principal Problem:   Sepsis ([place]) Active Problems:   Bacteremia   Chronic indwelling Foley catheter    carbidopa-levodopa  1 tablet Oral QHS   carbidopa-levodopa  2 tablet Oral 2 times per day   And   carbidopa-levodopa  1 tablet Oral Daily   Chlorhexidine Gluconate Cloth  6 each Topical Daily   fludrocortisone  0.1 mg Oral Daily   glycopyrrolate  1 mg Oral BID   heparin  5,000 Units Subcutaneous Q8H   sertraline  25 mg Oral Daily    HPI: Theodore King is a 76 y.o. male Resting quietly in bed Awakens easily.  No complaints.    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   SOB (shortness of breath) 03/23/2018   Unspecified vitamin D deficiency    Unspecified vitamin D deficiency    Vertigo, benign paroxysmal 11/05/2016   Vitamin D deficiency 05/18/2013    Social History   Tobacco Use   Smoking status: Former    Packs/day: 0.50    Years: 20.00    Total pack years: 10.00    Types: Cigarettes    Start  date: 08/14/1953    Quit date: 05/23/1974    Years since quitting: 47.9   Smokeless tobacco: Never  Vaping Use   Vaping Use: Never used  Substance Use Topics   Alcohol use: Not Currently   Drug use: No    Family History  Problem Relation Age of Onset   Cancer Mother    Cancer Father    Stroke Brother    Cancer Sister    Healthy Daughter    Cancer Sister    Lung cancer Brother    Healthy Daughter    Colon cancer Neg Hx    Esophageal cancer Neg Hx    Rectal cancer Neg Hx    Stomach cancer Neg Hx      Medications: I have reviewed the patient's current medications.  Abtx:  Anti-infectives (From admission, onward)    Start     Dose/Rate Route Frequency Ordered Stop   04/05/22 1400  ceFEPIme (MAXIPIME) 2 g in sodium chloride 0.9 % 100 mL IVPB        2 g 200 mL/hr over 30 Minutes Intravenous Every 24 hours 04/04/22 1713     04/05/22 1300  ampicillin (OMNIPEN) 2 g in sodium chloride 0.9 % 100 mL IVPB        2 g 300 mL/hr over 20 Minutes Intravenous Every 8 hours  04/05/22 1122     04/04/22 1645  cefTRIAXone (ROCEPHIN) 2 g in sodium chloride 0.9 % 100 mL IVPB  Status:  Discontinued        2 g 200 mL/hr over 30 Minutes Intravenous Every 24 hours 04/04/22 1640 04/04/22 1711   04/04/22 1345  ceFEPIme (MAXIPIME) 2 g in sodium chloride 0.9 % 100 mL IVPB        2 g 200 mL/hr over 30 Minutes Intravenous  Once 04/04/22 1338 04/04/22 1454         OBJECTIVE: Blood pressure (!) 147/73, pulse 64, temperature 98 F (36.7 C), temperature source Oral, resp. rate 18, height '6\' 1"'$  (1.854 m), weight 77.2 kg, SpO2 100 %.  Physical Exam Vitals reviewed.  Constitutional:      General: He is not in acute distress.    Appearance: He is not toxic-appearing or diaphoretic.  Cardiovascular:     Rate and Rhythm: Normal rate and regular rhythm.  Pulmonary:     Effort: Pulmonary effort is normal.     Breath sounds: Normal breath sounds.  Abdominal:     General: Bowel sounds are normal.  There is no distension.     Palpations: Abdomen is soft.     Tenderness: There is no abdominal tenderness.  Musculoskeletal:     Right lower leg: No edema.     Left lower leg: No edema.     Lab Results Results for orders placed or performed during the hospital encounter of 04/04/22 (from the past 48 hour(s))  Culture, blood (Routine X 2) w Reflex to ID Panel     Status: None (Preliminary result)   Collection Time: 04/05/22  2:48 PM   Specimen: BLOOD RIGHT ARM  Result Value Ref Range   Specimen Description BLOOD RIGHT ARM    Special Requests      BOTTLES DRAWN AEROBIC AND ANAEROBIC Blood Culture adequate volume   Culture      NO GROWTH < 24 HOURS Performed at [facility] [address], [place], [place] [address]    Report Status PENDING   Culture, blood (Routine X 2) w Reflex to ID Panel     Status: None (Preliminary result)   Collection Time: 04/05/22  2:53 PM   Specimen: BLOOD LEFT ARM  Result Value Ref Range   Specimen Description BLOOD LEFT ARM    Special Requests      BOTTLES DRAWN AEROBIC ONLY Blood Culture results may not be optimal due to an inadequate volume of blood received in culture bottles   Culture      NO GROWTH < 24 HOURS Performed at [facility] [address] [place], [place] [address]    Report Status PENDING   Vitamin B12     Status: None   Collection Time: 04/06/22  5:29 AM  Result Value Ref Range   Vitamin B-12 680 180 - 914 pg/mL    Comment: (NOTE) This assay is not validated for testing neonatal or myeloproliferative syndrome specimens for Vitamin B12 levels. Performed at [facility], [place] [address] [place], [place] [address]   Folate     Status: None   Collection Time: 04/06/22  5:29 AM  Result Value Ref Range   Folate 6.4 >5.9 ng/mL    Comment: Performed at [facility], [place] [address] [place], [place] [address]  Iron and TIBC     Status: Abnormal   Collection Time: 04/06/22  5:29  AM  Result  Value Ref Range   Iron 48 45 - 182 ug/dL   TIBC 150 (L) 250 - 450 ug/dL   Saturation Ratios 32 17.9 - 39.5 %   UIBC 102 ug/dL    Comment: Performed at [facility], [place] [address], [place], [place] [address]  Ferritin     Status: None   Collection Time: 04/06/22  5:29 AM  Result Value Ref Range   Ferritin 166 24 - 336 ng/mL    Comment: Performed at [facility], [place] [address], [place], [place] [address]  Reticulocytes     Status: Abnormal   Collection Time: 04/06/22  5:29 AM  Result Value Ref Range   Retic Ct Pct <0.4 (L) 0.4 - 3.1 %   RBC. 3.12 (L) 4.22 - 5.81 MIL/uL   Retic Count, Absolute Not Measured 19.0 - 186.0 K/uL   Immature Retic Fract Not Measured 2.3 - 15.9 %    Comment: Performed at [facility], [place] [address] [place], [place] [address]  Basic metabolic panel     Status: Abnormal   Collection Time: 04/06/22  5:29 AM  Result Value Ref Range   Sodium 143 135 - 145 mmol/L   Potassium 4.1 3.5 - 5.1 mmol/L   Chloride 116 (H) 98 - 111 mmol/L   CO2 23 22 - 32 mmol/L   Glucose, Bld 98 70 - 99 mg/dL    Comment: Glucose reference range applies only to samples taken after fasting for at least 8 hours.   BUN 58 (H) 8 - 23 mg/dL   Creatinine, Ser 2.76 (H) 0.61 - 1.24 mg/dL   Calcium 7.9 (L) 8.9 - 10.3 mg/dL   GFR, Estimated 23 (L) >60 mL/min    Comment: (NOTE) Calculated using the CKD-EPI Creatinine Equation (2021)    Anion gap 4 (L) 5 - 15    Comment: Performed at [facility], [place] [address], [place], [place] [address]  CBC     Status: Abnormal   Collection Time: 04/06/22  5:29 AM  Result Value Ref Range   WBC 9.5 4.0 - 10.5 K/uL   RBC 3.04 (L) 4.22 - 5.81 MIL/uL   Hemoglobin 7.7 (L) 13.0 - 17.0 g/dL   HCT 24.2 (L) 39.0 - 52.0 %   MCV 79.6 (L) 80.0 - 100.0 fL   MCH 25.3 (L) 26.0 - 34.0 pg   MCHC 31.8 30.0 - 36.0 g/dL   RDW 14.6 11.5 - 15.5 %   Platelets 157 150 - 400 K/uL    nRBC 0.0 0.0 - 0.2 %    Comment: Performed at [facility], [place] [address] [place],  [address]  Basic metabolic panel     Status: Abnormal   Collection Time: 04/07/22  5:16 AM  Result Value Ref Range   Sodium 144 135 - 145 mmol/L   Potassium 4.2 3.5 - 5.1 mmol/L   Chloride 117 (H) 98 - 111 mmol/L   CO2 23 22 - 32 mmol/L   Glucose, Bld 105 (H) 70 - 99 mg/dL    Comment: Glucose reference range applies only to samples taken after fasting for at least 8 hours.   BUN 48 (H) 8 - 23 mg/dL   Creatinine, Ser 2.34 (H) 0.61 - 1.24 mg/dL   Calcium 8.1 (L) 8.9 - 10.3 mg/dL   GFR, Estimated 28 (L) >60 mL/min    Comment: (NOTE) Calculated using the CKD-EPI Creatinine Equation (2021)    Anion gap 4 (L)  5 - 15    Comment: Performed at [facility], [place] [address], [place], [place] [address]  CBC     Status: Abnormal   Collection Time: 04/07/22  5:16 AM  Result Value Ref Range   WBC 6.3 4.0 - 10.5 K/uL   RBC 3.16 (L) 4.22 - 5.81 MIL/uL   Hemoglobin 8.0 (L) 13.0 - 17.0 g/dL   HCT 25.8 (L) 39.0 - 52.0 %   MCV 81.6 80.0 - 100.0 fL   MCH 25.3 (L) 26.0 - 34.0 pg   MCHC 31.0 30.0 - 36.0 g/dL   RDW 14.5 11.5 - 15.5 %   Platelets 155 150 - 400 K/uL   nRBC 0.0 0.0 - 0.2 %    Comment: Performed at [facility], [place] [address] [place], [place] [address]      Component Value Date/Time   SDES BLOOD LEFT ARM 04/05/2022 1453   [place]  04/05/2022 1453    BOTTLES DRAWN AEROBIC ONLY Blood Culture results may not be optimal due to an inadequate volume of blood received in culture bottles   CULT  04/05/2022 1453    NO GROWTH < 24 HOURS Performed at [facility], [place] [address], [place], [place] [address]    REPTSTATUS PENDING 04/05/2022 1453   No results found. Recent Results (from the past 240 hour(s))  Urine Culture     Status: Abnormal (Preliminary result)   Collection Time: 04/04/22  1:37 PM   Specimen: In/Out Cath Urine   Result Value Ref Range Status   Specimen Description   Final    IN/OUT CATH URINE Performed at [facility] [address], [place], [place] [address]    Special Requests   Final    NONE Performed at [facility], [place] [address] [place], [place] [address]    Culture (A)  Final    >=100,000 COLONIES/mL GRAM NEGATIVE RODS CULTURE REINCUBATED FOR BETTER GROWTH IDENTIFICATION AND SUSCEPTIBILITIES TO FOLLOW Performed at [facility], [place] [address], [place], [place] [address]    Report Status PENDING  Incomplete  Blood Culture (routine x 2)     Status: Abnormal (Preliminary result)   Collection Time: 04/04/22  1:42 PM   Specimen: BLOOD  Result Value Ref Range Status   Specimen Description   Final    BLOOD LEFT ANTECUBITAL Performed at [facility] [address] [place], [place] [address]    Special Requests   Final    BOTTLES DRAWN AEROBIC AND ANAEROBIC Blood Culture results may not be optimal due to an inadequate volume of blood received in culture bottles Performed at [facility] [address] [place], [place] [address]    Culture  Setup Time   Final    GRAM NEGATIVE RODS GRAM POSITIVE COCCI IN CHAINS IN BOTH AEROBIC AND ANAEROBIC BOTTLES CRITICAL RESULT CALLED TO, READ BACK BY AND VERIFIED WITH: PHARMD D WOFFORD [id number] AT 1046 BY CM    Culture (A)  Final    ENTEROCOCCUS FAECALIS ENTEROBACTER CLOACAE CULTURE REINCUBATED FOR BETTER GROWTH SUSCEPTIBILITIES TO FOLLOW Performed at [facility], [place] [address] [place], [place] [address]    Report Status PENDING  Incomplete  Blood Culture ID Panel (Reflexed)     Status: Abnormal   Collection Time: 04/04/22  1:42 PM  Result Value Ref Range Status   Enterococcus faecalis DETECTED (A) NOT DETECTED Final    Comment: CRITICAL RESULT CALLED TO,  READ BACK BY AND VERIFIED WITH: PHARMD D WOFFORD [id number] AT 8502 AM BY CM    Enterococcus  Faecium NOT DETECTED NOT DETECTED Final   Listeria monocytogenes NOT DETECTED NOT DETECTED Final   Staphylococcus species NOT DETECTED NOT DETECTED Final   Staphylococcus aureus (BCID) NOT DETECTED NOT DETECTED Final   Staphylococcus epidermidis NOT DETECTED NOT DETECTED Final   Staphylococcus lugdunensis NOT DETECTED NOT DETECTED Final   Streptococcus species NOT DETECTED NOT DETECTED Final   Streptococcus agalactiae NOT DETECTED NOT DETECTED Final   Streptococcus pneumoniae NOT DETECTED NOT DETECTED Final   Streptococcus pyogenes NOT DETECTED NOT DETECTED Final   A.calcoaceticus-baumannii NOT DETECTED NOT DETECTED Final   Bacteroides fragilis NOT DETECTED NOT DETECTED Final   Enterobacterales DETECTED (A) NOT DETECTED Final    Comment: Enterobacterales represent a large order of gram negative bacteria, not a single organism. CRITICAL RESULT CALLED TO, READ BACK BY AND VERIFIED WITH: PHARMD D WOFFORD [id number] AT 1046 AM BY CM    Enterobacter cloacae complex DETECTED (A) NOT DETECTED Final    Comment: CRITICAL RESULT CALLED TO, READ BACK BY AND VERIFIED WITH: PHARMD D WOFFORD [id number] AT 1046 AM BY CM    Escherichia coli NOT DETECTED NOT DETECTED Final   Klebsiella aerogenes NOT DETECTED NOT DETECTED Final   Klebsiella oxytoca NOT DETECTED NOT DETECTED Final   Klebsiella pneumoniae NOT DETECTED NOT DETECTED Final   Proteus species NOT DETECTED NOT DETECTED Final   Salmonella species NOT DETECTED NOT DETECTED Final   Serratia marcescens NOT DETECTED NOT DETECTED Final   Haemophilus influenzae NOT DETECTED NOT DETECTED Final   Neisseria meningitidis NOT DETECTED NOT DETECTED Final   Pseudomonas aeruginosa NOT DETECTED NOT DETECTED Final   Stenotrophomonas maltophilia NOT DETECTED NOT DETECTED Final   Candida albicans NOT DETECTED NOT DETECTED Final   Candida auris NOT DETECTED NOT DETECTED Final   Candida glabrata NOT DETECTED NOT DETECTED Final   Candida krusei NOT DETECTED NOT DETECTED  Final   Candida parapsilosis NOT DETECTED NOT DETECTED Final   Candida tropicalis NOT DETECTED NOT DETECTED Final   Cryptococcus neoformans/gattii NOT DETECTED NOT DETECTED Final   CTX-M ESBL NOT DETECTED NOT DETECTED Final   Carbapenem resistance IMP NOT DETECTED NOT DETECTED Final   Carbapenem resistance KPC NOT DETECTED NOT DETECTED Final   Carbapenem resistance NDM NOT DETECTED NOT DETECTED Final   Carbapenem resist OXA 48 LIKE NOT DETECTED NOT DETECTED Final   Vancomycin resistance NOT DETECTED NOT DETECTED Final   Carbapenem resistance VIM NOT DETECTED NOT DETECTED Final    Comment: Performed at [facility], [address], [place], [place] [address]  Blood Culture (routine x 2)     Status: None (Preliminary result)   Collection Time: 04/04/22  1:45 PM   Specimen: BLOOD  Result Value Ref Range Status   Specimen Description   Final    BLOOD RIGHT ANTECUBITAL Performed at [facility] [address] [place], [place] [address]    Special Requests   Final    BOTTLES DRAWN AEROBIC AND ANAEROBIC Blood Culture adequate volume Performed at [facility] [address] [place], [place] [address]    Culture  Setup Time   Final    GRAM NEGATIVE RODS IN BOTH AEROBIC AND ANAEROBIC BOTTLES CRITICAL VALUE NOTED.  VALUE IS CONSISTENT WITH PREVIOUSLY REPORTED AND CALLED VALUE.    Culture   Final    GRAM NEGATIVE RODS IDENTIFICATION TO FOLLOW Performed at [facility], [place] [address] [place], [place] [address]    Report Status PENDING  Incomplete  Resp Panel by RT-PCR (Flu A&B, Covid)     Status: None   Collection Time: 04/04/22  4:22 PM   Specimen: Nasal Swab  Result Value Ref Range Status   SARS Coronavirus 2 by RT PCR NEGATIVE NEGATIVE Final    Comment: (NOTE) SARS-CoV-2 target nucleic acids are NOT DETECTED.  The SARS-CoV-2 RNA is generally detectable in upper respiratory specimens during the acute phase of infection. The  lowest concentration of SARS-CoV-2 viral copies this assay can detect is 138 copies/mL. A negative result does not preclude SARS-Cov-2 infection and should not be used as the sole basis for treatment or other patient management decisions. A negative result may occur with  improper specimen collection/handling, submission of specimen other than nasopharyngeal swab, presence of viral mutation(s) within the areas targeted by this assay, and inadequate number of viral copies(<138 copies/mL). A negative result must be combined with clinical observations, patient history, and epidemiological information. The expected result is Negative.  Fact Sheet for Patients:  EntrepreneurPulse.com.au  Fact Sheet for Healthcare Providers:  IncredibleEmployment.be  This test is no t yet approved or cleared by the [place] FDA and  has been authorized for detection and/or diagnosis of SARS-CoV-2 by FDA under an Emergency Use Authorization (EUA). This EUA will remain  in effect (meaning this test can be used) for the duration of the COVID-19 declaration under Section 564(b)(1) of the Act, 21 U.S.C.section 360bbb-3(b)(1), unless the authorization is terminated  or revoked sooner.       Influenza A by PCR NEGATIVE NEGATIVE Final   Influenza B by PCR NEGATIVE NEGATIVE Final    Comment: (NOTE) The Xpert Xpress SARS-CoV-2/FLU/RSV plus assay is intended as an aid in the diagnosis of influenza from Nasopharyngeal swab specimens and should not be used as a sole basis for treatment. Nasal washings and aspirates are unacceptable for Xpert Xpress SARS-CoV-2/FLU/RSV testing.  Fact Sheet for Patients: EntrepreneurPulse.com.au  Fact Sheet for Healthcare Providers: IncredibleEmployment.be  This test is not yet approved or cleared by the [place] FDA and has been authorized for detection and/or diagnosis of SARS-CoV-2 by FDA under  an Emergency Use Authorization (EUA). This EUA will remain in effect (meaning this test can be used) for the duration of the COVID-19 declaration under Section 564(b)(1) of the Act, 21 U.S.C. section 360bbb-3(b)(1), unless the authorization is terminated or revoked.  Performed at [facility], [place] [address], [place], [place] [address]   Culture, blood (Routine X 2) w Reflex to ID Panel     Status: None (Preliminary result)   Collection Time: 04/05/22  2:48 PM   Specimen: BLOOD RIGHT ARM  Result Value Ref Range Status   Specimen Description BLOOD RIGHT ARM  Final   Special Requests   Final    BOTTLES DRAWN AEROBIC AND ANAEROBIC Blood Culture adequate volume   Culture   Final    NO GROWTH < 24 HOURS Performed at [facility], [place] [address], [place], [place] [address]    Report Status PENDING  Incomplete  Culture, blood (Routine X 2) w Reflex to ID Panel     Status: None (Preliminary result)   Collection Time: 04/05/22  2:53 PM   Specimen: BLOOD LEFT ARM  Result Value Ref Range Status   Specimen Description BLOOD LEFT ARM  Final   Special Requests   Final    BOTTLES DRAWN AEROBIC ONLY  Blood Culture results may not be optimal due to an inadequate volume of blood received in culture bottles   Culture   Final    NO GROWTH < 24 HOURS Performed at [facility] [address] [place], [place] [address]    Report Status PENDING  Incomplete    Microbiology: Recent Results (from the past 240 hour(s))  Urine Culture     Status: Abnormal (Preliminary result)   Collection Time: 04/04/22  1:37 PM   Specimen: In/Out Cath Urine  Result Value Ref Range Status   Specimen Description   Final    IN/OUT CATH URINE Performed at [facility] [address] [place], [place] [address]    Special Requests   Final    NONE Performed at [facility], [place] [address], [place], [place] [address]    Culture (A)  Final     >=100,000 COLONIES/mL GRAM NEGATIVE RODS CULTURE REINCUBATED FOR BETTER GROWTH IDENTIFICATION AND SUSCEPTIBILITIES TO FOLLOW Performed at [facility], [place] [address] [place], [place] [address]    Report Status PENDING  Incomplete  Blood Culture (routine x 2)     Status: Abnormal (Preliminary result)   Collection Time: 04/04/22  1:42 PM   Specimen: BLOOD  Result Value Ref Range Status   Specimen Description   Final    BLOOD LEFT ANTECUBITAL Performed at [facility] [address] [place], [place] [address]    Special Requests   Final    BOTTLES DRAWN AEROBIC AND ANAEROBIC Blood Culture results may not be optimal due to an inadequate volume of blood received in culture bottles Performed at [facility] [address] [place], [place] [address]    Culture  Setup Time   Final    GRAM NEGATIVE RODS GRAM POSITIVE COCCI IN CHAINS IN BOTH AEROBIC AND ANAEROBIC BOTTLES CRITICAL RESULT CALLED TO, READ BACK BY AND VERIFIED WITH: PHARMD D WOFFORD [id number] AT 1046 BY CM    Culture (A)  Final    ENTEROCOCCUS FAECALIS ENTEROBACTER CLOACAE CULTURE REINCUBATED FOR BETTER GROWTH SUSCEPTIBILITIES TO FOLLOW Performed at [facility], [place] [address], [place], [place] [address]    Report Status PENDING  Incomplete  Blood Culture ID Panel (Reflexed)     Status: Abnormal   Collection Time: 04/04/22  1:42 PM  Result Value Ref Range Status   Enterococcus faecalis DETECTED (A) NOT DETECTED Final    Comment: CRITICAL RESULT CALLED TO, READ BACK BY AND VERIFIED WITH: PHARMD D WOFFORD [id number] AT 1046 AM BY CM    Enterococcus Faecium NOT DETECTED NOT DETECTED Final   Listeria monocytogenes NOT DETECTED NOT DETECTED Final   Staphylococcus species NOT DETECTED NOT DETECTED Final   Staphylococcus aureus (BCID) NOT DETECTED NOT DETECTED Final   Staphylococcus epidermidis NOT DETECTED NOT DETECTED Final   Staphylococcus lugdunensis NOT DETECTED NOT  DETECTED Final   Streptococcus species NOT DETECTED NOT DETECTED Final   Streptococcus agalactiae NOT DETECTED NOT DETECTED Final   Streptococcus pneumoniae NOT DETECTED NOT DETECTED Final   Streptococcus pyogenes NOT DETECTED NOT DETECTED Final   A.calcoaceticus-baumannii NOT DETECTED NOT DETECTED Final   Bacteroides fragilis NOT DETECTED NOT DETECTED Final   Enterobacterales DETECTED (A) NOT DETECTED Final    Comment: Enterobacterales represent a large order of gram negative bacteria, not a single organism. CRITICAL RESULT CALLED TO, READ BACK BY AND VERIFIED WITH: PHARMD D WOFFORD [id number] AT 1046 AM BY CM  Enterobacter cloacae complex DETECTED (A) NOT DETECTED Final    Comment: CRITICAL RESULT CALLED TO, READ BACK BY AND VERIFIED WITH: PHARMD D WOFFORD [id number] AT 1046 AM BY CM    Escherichia coli NOT DETECTED NOT DETECTED Final   Klebsiella aerogenes NOT DETECTED NOT DETECTED Final   Klebsiella oxytoca NOT DETECTED NOT DETECTED Final   Klebsiella pneumoniae NOT DETECTED NOT DETECTED Final   Proteus species NOT DETECTED NOT DETECTED Final   Salmonella species NOT DETECTED NOT DETECTED Final   Serratia marcescens NOT DETECTED NOT DETECTED Final   Haemophilus influenzae NOT DETECTED NOT DETECTED Final   Neisseria meningitidis NOT DETECTED NOT DETECTED Final   Pseudomonas aeruginosa NOT DETECTED NOT DETECTED Final   Stenotrophomonas maltophilia NOT DETECTED NOT DETECTED Final   Candida albicans NOT DETECTED NOT DETECTED Final   Candida auris NOT DETECTED NOT DETECTED Final   Candida glabrata NOT DETECTED NOT DETECTED Final   Candida krusei NOT DETECTED NOT DETECTED Final   Candida parapsilosis NOT DETECTED NOT DETECTED Final   Candida tropicalis NOT DETECTED NOT DETECTED Final   Cryptococcus neoformans/gattii NOT DETECTED NOT DETECTED Final   CTX-M ESBL NOT DETECTED NOT DETECTED Final   Carbapenem resistance IMP NOT DETECTED NOT DETECTED Final   Carbapenem resistance KPC NOT  DETECTED NOT DETECTED Final   Carbapenem resistance NDM NOT DETECTED NOT DETECTED Final   Carbapenem resist OXA 48 LIKE NOT DETECTED NOT DETECTED Final   Vancomycin resistance NOT DETECTED NOT DETECTED Final   Carbapenem resistance VIM NOT DETECTED NOT DETECTED Final    Comment: Performed at [facility], [address], [place], [place] [address]  Blood Culture (routine x 2)     Status: None (Preliminary result)   Collection Time: 04/04/22  1:45 PM   Specimen: BLOOD  Result Value Ref Range Status   Specimen Description   Final    BLOOD RIGHT ANTECUBITAL Performed at [facility] [address], [place], [place] [address]    Special Requests   Final    BOTTLES DRAWN AEROBIC AND ANAEROBIC Blood Culture adequate volume Performed at [facility] [address] [place], [place] [address]    Culture  Setup Time   Final    GRAM NEGATIVE RODS IN BOTH AEROBIC AND ANAEROBIC BOTTLES CRITICAL VALUE NOTED.  VALUE IS CONSISTENT WITH PREVIOUSLY REPORTED AND CALLED VALUE.    Culture   Final    GRAM NEGATIVE RODS IDENTIFICATION TO FOLLOW Performed at [facility], [place] [address], [place], [place] [address]    Report Status PENDING  Incomplete  Resp Panel by RT-PCR (Flu A&B, Covid)     Status: None   Collection Time: 04/04/22  4:22 PM   Specimen: Nasal Swab  Result Value Ref Range Status   SARS Coronavirus 2 by RT PCR NEGATIVE NEGATIVE Final    Comment: (NOTE) SARS-CoV-2 target nucleic acids are NOT DETECTED.  The SARS-CoV-2 RNA is generally detectable in upper respiratory specimens during the acute phase of infection. The lowest concentration of SARS-CoV-2 viral copies this assay can detect is 138 copies/mL. A negative result does not preclude SARS-Cov-2 infection and should not be used as the sole basis for treatment or other patient management decisions. A negative result may occur with  improper specimen collection/handling,  submission of specimen other than nasopharyngeal swab, presence of viral mutation(s) within the areas targeted by this assay, and inadequate number of viral copies(<138 copies/mL). A negative result must be combined with clinical  observations, patient history, and epidemiological information. The expected result is Negative.  Fact Sheet for Patients:  EntrepreneurPulse.com.au  Fact Sheet for Healthcare Providers:  IncredibleEmployment.be  This test is no t yet approved or cleared by the [place] FDA and  has been authorized for detection and/or diagnosis of SARS-CoV-2 by FDA under an Emergency Use Authorization (EUA). This EUA will remain  in effect (meaning this test can be used) for the duration of the COVID-19 declaration under Section 564(b)(1) of the Act, 21 U.S.C.section 360bbb-3(b)(1), unless the authorization is terminated  or revoked sooner.       Influenza A by PCR NEGATIVE NEGATIVE Final   Influenza B by PCR NEGATIVE NEGATIVE Final    Comment: (NOTE) The Xpert Xpress SARS-CoV-2/FLU/RSV plus assay is intended as an aid in the diagnosis of influenza from Nasopharyngeal swab specimens and should not be used as a sole basis for treatment. Nasal washings and aspirates are unacceptable for Xpert Xpress SARS-CoV-2/FLU/RSV testing.  Fact Sheet for Patients: EntrepreneurPulse.com.au  Fact Sheet for Healthcare Providers: IncredibleEmployment.be  This test is not yet approved or cleared by the [place] FDA and has been authorized for detection and/or diagnosis of SARS-CoV-2 by FDA under an Emergency Use Authorization (EUA). This EUA will remain in effect (meaning this test can be used) for the duration of the COVID-19 declaration under Section 564(b)(1) of the Act, 21 U.S.C. section 360bbb-3(b)(1), unless the authorization is terminated or revoked.  Performed at [facility], [place] [address], [place], [place] [address]   Culture, blood (Routine X 2) w Reflex to ID Panel     Status: None (Preliminary result)   Collection Time: 04/05/22  2:48 PM   Specimen: BLOOD RIGHT ARM  Result Value Ref Range Status   Specimen Description BLOOD RIGHT ARM  Final   Special Requests   Final    BOTTLES DRAWN AEROBIC AND ANAEROBIC Blood Culture adequate volume   Culture   Final    NO GROWTH < 24 HOURS Performed at [facility], [place] [address], [place], [place] [address]    Report Status PENDING  Incomplete  Culture, blood (Routine X 2) w Reflex to ID Panel     Status: None (Preliminary result)   Collection Time: 04/05/22  2:53 PM   Specimen: BLOOD LEFT ARM  Result Value Ref Range Status   Specimen Description BLOOD LEFT ARM  Final   Special Requests   Final    BOTTLES DRAWN AEROBIC ONLY Blood Culture results may not be optimal due to an inadequate volume of blood received in culture bottles   Culture   Final    NO GROWTH < 24 HOURS Performed at [facility], [place] [address], [place], [place] [address]    Report Status PENDING  Incomplete    Radiographs and labs were personally reviewed by me.   Bobby Rumpf, MD [facility] [phone] 04/07/2022, 8:15 AM

## 2022-04-07 NOTE — Progress Notes (Signed)
Pharmacy Antibiotic Note  Theodore King is a 76 y.o. male admitted on 04/04/2022 with sepsis secondary to UTI.  Pharmacy has been consulted for Cefepime dosing.  Day #4 abx - Afebrile - WBC 6.3, improved - SCr 2.34, improved - CrCl 28 ml/min  Plan: Continue Cefepime 2g IV q24h Monitor renal function, cultures, clinical course  Height: '6\' 1"'$  (185.4 cm) Weight: 77.2 kg (170 lb 3.1 oz) IBW/kg (Calculated) : 79.9  Temp (24hrs), Avg:98 F (36.7 C), Min:97.8 F (36.6 C), Max:98.2 F (36.8 C)  Recent Labs  Lab 04/04/22 1331 04/04/22 1345 04/04/22 1537 04/05/22 0511 04/06/22 0529 04/07/22 0516  WBC 21.3*  --   --  18.4* 9.5 6.3  CREATININE 4.76*  --   --  3.62* 2.76* 2.34*  LATICACIDVEN  --  2.4* 2.2*  --   --   --      Estimated Creatinine Clearance: 28.4 mL/min (A) (by C-G formula based on SCr of 2.34 mg/dL (H)).    No Known Allergies  Antimicrobials this admission: 10/29 Cefepime >> 10/30 Ampicillin>  Dose adjustments this admission: --  10/29 BCx: GNR 4/4; GPC 2/4 GPC in chains (BCID= enterococcus faecalis and enterobacter cloacae complex) 10/29 UCx: >100K enterobacter cloacae (R= ancef, I = nitrof) FINAL 10/30 bcx (3 bottles): ngtd   Thank you for allowing pharmacy to be a part of this patient's care.   Peggyann Juba, PharmD, BCPS Pharmacy: [phone] 04/07/2022 10:23 AM

## 2022-04-08 DIAGNOSIS — A419 Sepsis, unspecified organism: Secondary | ICD-10-CM | POA: Diagnosis not present

## 2022-04-08 DIAGNOSIS — Z978 Presence of other specified devices: Secondary | ICD-10-CM | POA: Diagnosis not present

## 2022-04-08 DIAGNOSIS — R6520 Severe sepsis without septic shock: Secondary | ICD-10-CM | POA: Diagnosis not present

## 2022-04-08 DIAGNOSIS — R7881 Bacteremia: Secondary | ICD-10-CM | POA: Diagnosis not present

## 2022-04-08 DIAGNOSIS — T83511D Infection and inflammatory reaction due to indwelling urethral catheter, subsequent encounter: Secondary | ICD-10-CM | POA: Diagnosis not present

## 2022-04-08 LAB — BASIC METABOLIC PANEL
Anion gap: 7 (ref 5–15)
BUN: 34 mg/dL — ABNORMAL HIGH (ref 8–23)
CO2: 23 mmol/L (ref 22–32)
Calcium: 8.1 mg/dL — ABNORMAL LOW (ref 8.9–10.3)
Chloride: 115 mmol/L — ABNORMAL HIGH (ref 98–111)
Creatinine, Ser: 1.96 mg/dL — ABNORMAL HIGH (ref 0.61–1.24)
GFR, Estimated: 34 mL/min — ABNORMAL LOW (ref >60)
Glucose, Bld: 123 mg/dL — ABNORMAL HIGH (ref 70–99)
Potassium: 4 mmol/L (ref 3.5–5.1)
Sodium: 145 mmol/L (ref 135–145)

## 2022-04-08 MED ORDER — SODIUM CHLORIDE 0.9 % IV SOLN
2.0000 g | Freq: Four times a day (QID) | INTRAVENOUS | Status: DC
  Administered 2022-04-08 – 2022-04-09 (×5): 2 g via INTRAVENOUS
  Filled 2022-04-08 (×6): qty 2000

## 2022-04-08 MED ORDER — MELATONIN 3 MG PO TABS
3.0000 mg | ORAL_TABLET | Freq: Once | ORAL | Status: AC
  Administered 2022-04-08: 3 mg via ORAL
  Filled 2022-04-08: qty 1

## 2022-04-08 NOTE — Progress Notes (Signed)
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  In the emergency room WBC count 21.3, lactic acid 2.4.  Foley was occluded, exchanged and found to have 800 mL of urine retention.  He was also noted to have acute kidney injury with creatinine of 4.76 at presentation compared to baseline of 1.35 recent.  Admitted with acute UTI, sepsis, AKI and malfunctioning Foley catheter. Patient remains in the hospital due to persistent bacteremia.   Assessment & Plan:   Sepsis due to gram-negative rod, acute UTI present on admission secondary to Foley catheter Enterococcus and gram-negative rod bacteremia Sepsis present on admission due to AKI, leukocytosis.  Foley catheter exchanged and now draining well. Urine culture with Enterococcus cloacea and faecalis Blood cultures 10/29, Enterococcus faecalis and Enterococcus cloacea Blood cultures 10/30, growing Enterobacter again. Patient is currently on ampicillin and meropenem.  Awaiting repeat cultures to turn negative.  Followed by ID.  Clinically improving. Given clinical presentation, likely has prostatitis and will need prolonged antibiotic therapy.  Due to negative repeat cultures, not needing TEE.  Acute urinary retention, multifactorial acute kidney injury with underlying chronic kidney disease, malfunctioning Foley catheter: Maintenance IV fluid today.  Kidney functions are improving.  Advanced Parkinson's with dementia: On Sinemet.  Fall precautions.  Work with PT OT today.  Lives at home and has adequate support system at home.   DVT prophylaxis: heparin injection 5,000 Units  Start: 04/04/22 2200   Code Status: Full code Family Communication: Daughter on the phone 11/1. Disposition Plan: Status is: Inpatient Remains inpatient appropriate because: IV antibiotics, bacteremia, IV fluids     Consultants:  ID  Procedures:  None  Antimicrobials:  Meropenem and ampicillin 10/29---   Subjective: Patient seen and examined.  Denies any complaints.  No overnight events.  Is very pleasant and eating breakfast.  Objective: Vitals:   04/07/22 0423 04/07/22 1426 04/07/22 1957 04/08/22 0446  BP: (!) 147/73 136/82 (!) 158/65 (!) 151/78  Pulse: 64 86 62 (!) 59  Resp: '18 16 16 20  '$ Temp: 98 F (36.7 C) 98.9 F (37.2 C) 98.4 F (36.9 C) 98.6 F (37 C)  TempSrc: Oral Oral    SpO2: 100% 97% 100% 100%  Weight:      Height:        Intake/Output Summary (Last 24 hours) at 04/08/2022 1231 Last data filed at 04/08/2022 0915 Gross per 24 hour  Intake 1379.09 ml  Output 1700 ml  Net -320.91 ml   Filed Weights   04/04/22 1500  Weight: 77.2 kg    Examination:  General: Looks fairly comfortable at rest.  Eating breakfast in the morning rounds. Patient is alert and awake.  Mostly oriented.  Can move all extremities but deconditioned. Cardiovascular: S1-S2 normal.  Regular rate rhythm. Respiratory: Bilateral clear.  No added sounds. Gastrointestinal: Soft.  Nontender.  Bowel sound present. foley catheter with clear urine. White discharge surrounding the Foley catheter. Ext: No deformities.      Data Reviewed: I have personally reviewed following labs and imaging studies  CBC: Recent Labs  Lab 04/04/22 1331 04/05/22 0511 04/06/22 0529 04/07/22 0516  WBC 21.3* 18.4* 9.5 6.3  NEUTROABS 18.0*  --   --   --  HGB 8.5* 7.9* 7.7* 8.0*  HCT 26.4* 24.4* 24.2* 25.8*  MCV 79.8* 78.7* 79.6* 81.6  PLT 148* 153 157 419   Basic Metabolic Panel: Recent Labs  Lab 04/04/22 1331 04/05/22 0511 04/06/22 0529 04/07/22 0516 04/08/22 0906  NA 139 139 143 144  145  K 4.5 4.0 4.1 4.2 4.0  CL 111 111 116* 117* 115*  CO2 19* '23 23 23 23  '$ GLUCOSE 139* 100* 98 105* 123*  BUN 64* 64* 58* 48* 34*  CREATININE 4.76* 3.62* 2.76* 2.34* 1.96*  CALCIUM 7.7* 7.5* 7.9* 8.1* 8.1*   GFR: Estimated Creatinine Clearance: 33.9 mL/min (A) (by C-G formula based on SCr of 1.96 mg/dL (H)). Liver Function Tests: Recent Labs  Lab 04/05/22 0511  AST 52*  ALT 8  ALKPHOS 73  BILITOT 0.6  PROT 5.7*  ALBUMIN 2.0*   No results for input(s): "LIPASE", "AMYLASE" in the last 168 hours. No results for input(s): "AMMONIA" in the last 168 hours. Coagulation Profile: Recent Labs  Lab 04/04/22 1412  INR 1.3*   Cardiac Enzymes: No results for input(s): "CKTOTAL", "CKMB", "CKMBINDEX", "TROPONINI" in the last 168 hours. BNP (last 3 results) No results for input(s): "PROBNP" in the last 8760 hours. HbA1C: No results for input(s): "HGBA1C" in the last 72 hours. CBG: No results for input(s): "GLUCAP" in the last 168 hours. Lipid Profile: No results for input(s): "CHOL", "HDL", "LDLCALC", "TRIG", "CHOLHDL", "LDLDIRECT" in the last 72 hours. Thyroid Function Tests: No results for input(s): "TSH", "T4TOTAL", "FREET4", "T3FREE", "THYROIDAB" in the last 72 hours. Anemia Panel: Recent Labs    04/06/22 0529  VITAMINB12 680  FOLATE 6.4  FERRITIN 166  TIBC 150*  IRON 48  RETICCTPCT <0.4*   Sepsis Labs: Recent Labs  Lab 04/04/22 1345 04/04/22 1537  LATICACIDVEN 2.4* 2.2*    Recent Results (from the past 240 hour(s))  Urine Culture     Status: Abnormal   Collection Time: 04/04/22  1:37 PM   Specimen: In/Out Cath Urine  Result Value Ref Range Status   Specimen Description   Final    IN/OUT CATH URINE Performed at [facility] [address], [place], [place] [address]    Special Requests   Final    NONE Performed at [facility], [place] [address], [place], [place] [address]    Culture (A)  Final    >=100,000  COLONIES/mL ENTEROBACTER CLOACAE 80,000 COLONIES/mL ENTEROCOCCUS FAECALIS    Report Status 04/07/2022 FINAL  Final   Organism ID, Bacteria ENTEROBACTER CLOACAE (A)  Final   Organism ID, Bacteria ENTEROCOCCUS FAECALIS (A)  Final      Susceptibility   Enterobacter cloacae - MIC*    CEFAZOLIN >=64 RESISTANT Resistant     CEFEPIME 8 INTERMEDIATE Intermediate     CIPROFLOXACIN <=0.25 SENSITIVE Sensitive     GENTAMICIN <=1 SENSITIVE Sensitive     IMIPENEM 1 SENSITIVE Sensitive     NITROFURANTOIN <=16 SENSITIVE Sensitive     TRIMETH/SULFA <=20 SENSITIVE Sensitive     PIP/TAZO >=128 RESISTANT Resistant     * >=100,000 COLONIES/mL ENTEROBACTER CLOACAE   Enterococcus faecalis - MIC*    AMPICILLIN <=2 SENSITIVE Sensitive     NITROFURANTOIN <=16 SENSITIVE Sensitive     VANCOMYCIN 2 SENSITIVE Sensitive     * 80,000 COLONIES/mL ENTEROCOCCUS FAECALIS  Blood Culture (routine x 2)     Status: Abnormal (Preliminary result)   Collection Time: 04/04/22  1:42 PM   Specimen: BLOOD  Result Value Ref Range  Status   Specimen Description BLOOD LEFT ANTECUBITAL  Final   Special Requests   Final    BOTTLES DRAWN AEROBIC AND ANAEROBIC Blood Culture results may not be optimal due to an inadequate volume of blood received in culture bottles   Culture  Setup Time   Final    GRAM NEGATIVE RODS GRAM POSITIVE COCCI IN CHAINS IN BOTH AEROBIC AND ANAEROBIC BOTTLES CRITICAL RESULT CALLED TO, READ BACK BY AND VERIFIED WITH: PHARMD D WOFFORD [id number] AT 1046 BY CM    Culture (A)  Final    ENTEROCOCCUS FAECALIS ENTEROBACTER CLOACAE REISOLATING ENTEROBACTER CLOACAE    Report Status PENDING  Incomplete   Organism ID, Bacteria ENTEROCOCCUS FAECALIS  Final      Susceptibility   Enterococcus faecalis - MIC*    AMPICILLIN <=2 SENSITIVE Sensitive     VANCOMYCIN 2 SENSITIVE Sensitive     GENTAMICIN SYNERGY Value in next row Sensitive      SENSITIVEPerformed at [facility] [address], [place],   [address]    * ENTEROCOCCUS FAECALIS  Blood Culture ID Panel (Reflexed)     Status: Abnormal   Collection Time: 04/04/22  1:42 PM  Result Value Ref Range Status   Enterococcus faecalis DETECTED (A) NOT DETECTED Final    Comment: CRITICAL RESULT CALLED TO, READ BACK BY AND VERIFIED WITH: PHARMD D WOFFORD [id number] AT 1046 AM BY CM    Enterococcus Faecium NOT DETECTED NOT DETECTED Final   Listeria monocytogenes NOT DETECTED NOT DETECTED Final   Staphylococcus species NOT DETECTED NOT DETECTED Final   Staphylococcus aureus (BCID) NOT DETECTED NOT DETECTED Final   Staphylococcus epidermidis NOT DETECTED NOT DETECTED Final   Staphylococcus lugdunensis NOT DETECTED NOT DETECTED Final   Streptococcus species NOT DETECTED NOT DETECTED Final   Streptococcus agalactiae NOT DETECTED NOT DETECTED Final   Streptococcus pneumoniae NOT DETECTED NOT DETECTED Final   Streptococcus pyogenes NOT DETECTED NOT DETECTED Final   A.calcoaceticus-baumannii NOT DETECTED NOT DETECTED Final   Bacteroides fragilis NOT DETECTED NOT DETECTED Final   Enterobacterales DETECTED (A) NOT DETECTED Final    Comment: Enterobacterales represent a large order of gram negative bacteria, not a single organism. CRITICAL RESULT CALLED TO, READ BACK BY AND VERIFIED WITH: PHARMD D WOFFORD [id number] AT 1046 AM BY CM    Enterobacter cloacae complex DETECTED (A) NOT DETECTED Final    Comment: CRITICAL RESULT CALLED TO, READ BACK BY AND VERIFIED WITH: PHARMD D WOFFORD [id number] AT 1046 AM BY CM    Escherichia coli NOT DETECTED NOT DETECTED Final   Klebsiella aerogenes NOT DETECTED NOT DETECTED Final   Klebsiella oxytoca NOT DETECTED NOT DETECTED Final   Klebsiella pneumoniae NOT DETECTED NOT DETECTED Final   Proteus species NOT DETECTED NOT DETECTED Final   Salmonella species NOT DETECTED NOT DETECTED Final   Serratia marcescens NOT DETECTED NOT DETECTED Final   Haemophilus influenzae NOT DETECTED NOT DETECTED Final   Neisseria meningitidis  NOT DETECTED NOT DETECTED Final   Pseudomonas aeruginosa NOT DETECTED NOT DETECTED Final   Stenotrophomonas maltophilia NOT DETECTED NOT DETECTED Final   Candida albicans NOT DETECTED NOT DETECTED Final   Candida auris NOT DETECTED NOT DETECTED Final   Candida glabrata NOT DETECTED NOT DETECTED Final   Candida krusei NOT DETECTED NOT DETECTED Final   Candida parapsilosis NOT DETECTED NOT DETECTED Final   Candida tropicalis NOT DETECTED NOT DETECTED Final   Cryptococcus neoformans/gattii NOT DETECTED NOT DETECTED Final   CTX-M ESBL NOT DETECTED NOT  DETECTED Final   Carbapenem resistance IMP NOT DETECTED NOT DETECTED Final   Carbapenem resistance KPC NOT DETECTED NOT DETECTED Final   Carbapenem resistance NDM NOT DETECTED NOT DETECTED Final   Carbapenem resist OXA 48 LIKE NOT DETECTED NOT DETECTED Final   Vancomycin resistance NOT DETECTED NOT DETECTED Final   Carbapenem resistance VIM NOT DETECTED NOT DETECTED Final    Comment: Performed at [facility], [place] [address], [place], [place] [address]  Blood Culture (routine x 2)     Status: Abnormal (Preliminary result)   Collection Time: 04/04/22  1:45 PM   Specimen: BLOOD  Result Value Ref Range Status   Specimen Description   Final    BLOOD RIGHT ANTECUBITAL Performed at [facility] [address] [place], [place] [address]    Special Requests   Final    BOTTLES DRAWN AEROBIC AND ANAEROBIC Blood Culture adequate volume Performed at [facility] [address] [place], [place] [address]    Culture  Setup Time   Final    GRAM NEGATIVE RODS IN BOTH AEROBIC AND ANAEROBIC BOTTLES CRITICAL VALUE NOTED.  VALUE IS CONSISTENT WITH PREVIOUSLY REPORTED AND CALLED VALUE. Performed at [facility], [place] [address] [place] [address], [place], [place] [address]    Culture ENTEROBACTER CLOACAE (A)  Final   Report Status PENDING  Incomplete  Carbapenem Resistance Panel     Status: None   Collection Time:  04/04/22  4:00 PM  Result Value Ref Range Status   Carba Resistance IMP Gene NOT DETECTED NOT DETECTED Final   Carba Resistance VIM Gene NOT DETECTED NOT DETECTED Final   Carba Resistance NDM Gene NOT DETECTED NOT DETECTED Final   Carba Resistance KPC Gene NOT DETECTED NOT DETECTED Final   Carba Resistance OXA48 Gene NOT DETECTED NOT DETECTED Final    Comment: (NOTE) Cepheid Carba-R is an FDA-cleared nucleic acid amplification test  (NAAT)for the detection and differentiation of genes encoding the  most prevalent carbapenemases in bacterial isolate samples. Carbapenemase gene identification and implementation of comprehensive  infection control measures are recommended by the CDC to prevent the  spread of the resistant organisms. Performed at [facility], [place] [address], [place],  [address]   Resp Panel by RT-PCR (Flu A&B, Covid)     Status: None   Collection Time: 04/04/22  4:22 PM   Specimen: Nasal Swab  Result Value Ref Range Status   SARS Coronavirus 2 by RT PCR NEGATIVE NEGATIVE Final    Comment: (NOTE) SARS-CoV-2 target nucleic acids are NOT DETECTED.  The SARS-CoV-2 RNA is generally detectable in upper respiratory specimens during the acute phase of infection. The lowest concentration of SARS-CoV-2 viral copies this assay can detect is 138 copies/mL. A negative result does not preclude SARS-Cov-2 infection and should not be used as the sole basis for treatment or other patient management decisions. A negative result may occur with  improper specimen collection/handling, submission of specimen other than nasopharyngeal swab, presence of viral mutation(s) within the areas targeted by this assay, and inadequate number of viral copies(<138 copies/mL). A negative result must be combined with clinical observations, patient history, and epidemiological information. The expected result is Negative.  Fact Sheet for Patients:   EntrepreneurPulse.com.au  Fact Sheet for Healthcare Providers:  IncredibleEmployment.be  This test is no t yet approved or cleared by the [place] FDA and  has been authorized for detection and/or diagnosis of SARS-CoV-2 by FDA under an Emergency Use Authorization (  EUA). This EUA will remain  in effect (meaning this test can be used) for the duration of the COVID-19 declaration under Section 564(b)(1) of the Act, 21 U.S.C.section 360bbb-3(b)(1), unless the authorization is terminated  or revoked sooner.       Influenza A by PCR NEGATIVE NEGATIVE Final   Influenza B by PCR NEGATIVE NEGATIVE Final    Comment: (NOTE) The Xpert Xpress SARS-CoV-2/FLU/RSV plus assay is intended as an aid in the diagnosis of influenza from Nasopharyngeal swab specimens and should not be used as a sole basis for treatment. Nasal washings and aspirates are unacceptable for Xpert Xpress SARS-CoV-2/FLU/RSV testing.  Fact Sheet for Patients: EntrepreneurPulse.com.au  Fact Sheet for Healthcare Providers: IncredibleEmployment.be  This test is not yet approved or cleared by the [place] FDA and has been authorized for detection and/or diagnosis of SARS-CoV-2 by FDA under an Emergency Use Authorization (EUA). This EUA will remain in effect (meaning this test can be used) for the duration of the COVID-19 declaration under Section 564(b)(1) of the Act, 21 U.S.C. section 360bbb-3(b)(1), unless the authorization is terminated or revoked.  Performed at [facility], [place] [address], [place], [place] [address]   Culture, blood (Routine X 2) w Reflex to ID Panel     Status: None (Preliminary result)   Collection Time: 04/05/22  2:48 PM   Specimen: BLOOD RIGHT ARM  Result Value Ref Range Status   Specimen Description BLOOD RIGHT ARM  Final   Special Requests   Final    BOTTLES DRAWN AEROBIC AND ANAEROBIC Blood  Culture adequate volume   Culture  Setup Time   Final    GRAM NEGATIVE RODS ANAEROBIC BOTTLE ONLY CRITICAL RESULT CALLED TO, READ BACK BY AND VERIFIED WITH: PHARMD JUSTIN LEGGE [id number] FCP CRITICAL VALUE NOTED.  VALUE IS CONSISTENT WITH PREVIOUSLY REPORTED AND CALLED VALUE. Performed at [facility], [place] [address] [place], [place] [address]    Culture GRAM NEGATIVE RODS  Final   Report Status PENDING  Incomplete  Culture, blood (Routine X 2) w Reflex to ID Panel     Status: None (Preliminary result)   Collection Time: 04/05/22  2:53 PM   Specimen: BLOOD LEFT ARM  Result Value Ref Range Status   Specimen Description BLOOD LEFT ARM  Final   Special Requests   Final    BOTTLES DRAWN AEROBIC ONLY Blood Culture results may not be optimal due to an inadequate volume of blood received in culture bottles   Culture   Final    NO GROWTH 3 DAYS Performed at [facility], [place] [address], [place], [place] [address]    Report Status PENDING  Incomplete         Radiology Studies: No results found.      Scheduled Meds:  carbidopa-levodopa  1 tablet Oral QHS   carbidopa-levodopa  2 tablet Oral 2 times per day   And   carbidopa-levodopa  1 tablet Oral Daily   Chlorhexidine Gluconate Cloth  6 each Topical Daily   fludrocortisone  0.1 mg Oral Daily   glycopyrrolate  1 mg Oral BID   heparin  5,000 Units Subcutaneous Q8H   sertraline  25 mg Oral Daily   Continuous Infusions:  ampicillin (OMNIPEN) IV 2 g (04/08/22 1142)   lactated ringers 50 mL/hr at 04/08/22 1142   meropenem (MERREM) IV 1 g (04/08/22 0819)     LOS: 4 days    Time spent: 35 minutes  Barb Merino, MD [facility] Pager [phone]

## 2022-04-08 NOTE — Progress Notes (Signed)
Infectious Disease    Date of Admission:  04/04/2022   Total days of antibiotics:   4 (amp/cefepime--> merrem/amp)          Assessment: UTI Sepsis Polymicrobial bacteremia (E faecalis, Enterobacter cloacae) Dementia Chronic Foley AKI  Plan: His repeat BCx is 1/3+ for enterobacter Will await his sensi for carbapenem on his repeat BCx.  His prev Cx was sensitive to imipenem His Cr was improving over the last 72h   Principal Problem:   Sepsis (HCC) Active Problems:   Bacteremia   Chronic indwelling Foley catheter    carbidopa-levodopa  1 tablet Oral QHS   carbidopa-levodopa  2 tablet Oral 2 times per day   And   carbidopa-levodopa  1 tablet Oral Daily   Chlorhexidine Gluconate Cloth  6 each Topical Daily   fludrocortisone  0.1 mg Oral Daily   glycopyrrolate  1 mg Oral BID   heparin  5,000 Units Subcutaneous Q8H   sertraline  25 mg Oral Daily    HPI: Theodore King is a 76 y.o. male  Awake and alert No complaints.    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   SOB (shortness of breath) 03/23/2018   Unspecified vitamin D deficiency    Unspecified vitamin D deficiency    Vertigo, benign paroxysmal 11/05/2016   Vitamin D deficiency 05/18/2013    Social History   Tobacco Use   Smoking status: Former    Packs/day: 0.50    Years: 20.00    Total pack years: 10.00    Types: Cigarettes    Start date:  08/14/1953    Quit date: 05/23/1974    Years since quitting: 47.9   Smokeless tobacco: Never  Vaping Use   Vaping Use: Never used  Substance Use Topics   Alcohol use: Not Currently   Drug use: No    Family History  Problem Relation Age of Onset   Cancer Mother    Cancer Father    Stroke Brother    Cancer Sister    Healthy Daughter    Cancer Sister    Lung cancer Brother    Healthy Daughter    Colon cancer Neg Hx    Esophageal cancer Neg Hx    Rectal cancer Neg Hx    Stomach cancer Neg Hx      Medications: I have reviewed the patient's current medications.  Abtx:  Anti-infectives (From admission, onward)    Start     Dose/Rate Route Frequency Ordered Stop   04/07/22 1400  meropenem (MERREM) 1 g in sodium chloride 0.9 % 100 mL IVPB        1 g 200 mL/hr over 30 Minutes Intravenous Every 12 hours 04/07/22 1258     04/05/22 1400  ceFEPIme (MAXIPIME) 2 g in sodium chloride 0.9 % 100 mL IVPB  Status:  Discontinued        2 g 200 mL/hr over  30 Minutes Intravenous Every 24 hours 04/04/22 1713 04/07/22 1258   04/05/22 1300  ampicillin (OMNIPEN) 2 g in sodium chloride 0.9 % 100 mL IVPB        2 g 300 mL/hr over 20 Minutes Intravenous Every 8 hours 04/05/22 1122     04/04/22 1645  cefTRIAXone (ROCEPHIN) 2 g in sodium chloride 0.9 % 100 mL IVPB  Status:  Discontinued        2 g 200 mL/hr over 30 Minutes Intravenous Every 24 hours 04/04/22 1640 04/04/22 1711   04/04/22 1345  ceFEPIme (MAXIPIME) 2 g in sodium chloride 0.9 % 100 mL IVPB        2 g 200 mL/hr over 30 Minutes Intravenous  Once 04/04/22 1338 04/04/22 1454         OBJECTIVE: Blood pressure (!) 151/78, pulse (!) 59, temperature 98.6 F (37 C), resp. rate 20, height '6\' 1"'$  (1.854 m), weight 77.2 kg, SpO2 100 %.  Physical Exam Vitals reviewed.  Constitutional:      General: He is not in acute distress. Cardiovascular:     Rate and Rhythm: Normal rate and regular rhythm.  Pulmonary:     Effort: Pulmonary effort  is normal.     Breath sounds: Normal breath sounds.  Abdominal:     General: Bowel sounds are normal. There is no distension.     Palpations: Abdomen is soft.     Tenderness: There is no abdominal tenderness.  Neurological:     Mental Status: He is alert.     Lab Results Results for orders placed or performed during the hospital encounter of 04/04/22 (from the past 48 hour(s))  Basic metabolic panel     Status: Abnormal   Collection Time: 04/07/22  5:16 AM  Result Value Ref Range   Sodium 144 135 - 145 mmol/L   Potassium 4.2 3.5 - 5.1 mmol/L   Chloride 117 (H) 98 - 111 mmol/L   CO2 23 22 - 32 mmol/L   Glucose, Bld 105 (H) 70 - 99 mg/dL    Comment: Glucose reference range applies only to samples taken after fasting for at least 8 hours.   BUN 48 (H) 8 - 23 mg/dL   Creatinine, Ser 2.34 (H) 0.61 - 1.24 mg/dL   Calcium 8.1 (L) 8.9 - 10.3 mg/dL   GFR, Estimated 28 (L) >60 mL/min    Comment: (NOTE) Calculated using the CKD-EPI Creatinine Equation (2021)    Anion gap 4 (L) 5 - 15    Comment: Performed at [facility], [place] [address], [place], [place] [address]  CBC     Status: Abnormal   Collection Time: 04/07/22  5:16 AM  Result Value Ref Range   WBC 6.3 4.0 - 10.5 K/uL   RBC 3.16 (L) 4.22 - 5.81 MIL/uL   Hemoglobin 8.0 (L) 13.0 - 17.0 g/dL   HCT 25.8 (L) 39.0 - 52.0 %   MCV 81.6 80.0 - 100.0 fL   MCH 25.3 (L) 26.0 - 34.0 pg   MCHC 31.0 30.0 - 36.0 g/dL   RDW 14.5 11.5 - 15.5 %   Platelets 155 150 - 400 K/uL   nRBC 0.0 0.0 - 0.2 %    Comment: Performed at [facility], [place] [address], [place], [place] [address]      Component Value Date/Time   SDES BLOOD LEFT ARM 04/05/2022 1453   SPECREQUEST  04/05/2022 1453    BOTTLES DRAWN AEROBIC ONLY Blood Culture results may  not be optimal due to an inadequate volume of blood received in culture bottles   CULT  04/05/2022 1453    NO GROWTH 3 DAYS Performed at [facility], [place]  [address] [place], [place] [address]    REPTSTATUS PENDING 04/05/2022 1453   No results found. Recent Results (from the past 240 hour(s))  Urine Culture     Status: Abnormal   Collection Time: 04/04/22  1:37 PM   Specimen: In/Out Cath Urine  Result Value Ref Range Status   Specimen Description   Final    IN/OUT CATH URINE Performed at [place] [address] [place], [place] [address]    Special Requests   Final    NONE Performed at [facility], [place] [address] [place], [place] [address]    Culture (A)  Final    >=100,000 COLONIES/mL ENTEROBACTER CLOACAE 80,000 COLONIES/mL ENTEROCOCCUS FAECALIS    Report Status 04/07/2022 FINAL  Final   Organism ID, Bacteria ENTEROBACTER CLOACAE (A)  Final   Organism ID, Bacteria ENTEROCOCCUS FAECALIS (A)  Final      Susceptibility   Enterobacter cloacae - MIC*    CEFAZOLIN >=64 RESISTANT Resistant     CEFEPIME 8 INTERMEDIATE Intermediate     CIPROFLOXACIN <=0.25 SENSITIVE Sensitive     GENTAMICIN <=1 SENSITIVE Sensitive     IMIPENEM 1 SENSITIVE Sensitive     NITROFURANTOIN <=16 SENSITIVE Sensitive     TRIMETH/SULFA <=20 SENSITIVE Sensitive     PIP/TAZO >=128 RESISTANT Resistant     * >=100,000 COLONIES/mL ENTEROBACTER CLOACAE   Enterococcus faecalis - MIC*    AMPICILLIN <=2 SENSITIVE Sensitive     NITROFURANTOIN <=16 SENSITIVE Sensitive     VANCOMYCIN 2 SENSITIVE Sensitive     * 80,000 COLONIES/mL ENTEROCOCCUS FAECALIS  Blood Culture (routine x 2)     Status: Abnormal (Preliminary result)   Collection Time: 04/04/22  1:42 PM   Specimen: BLOOD  Result Value Ref Range Status   Specimen Description BLOOD LEFT ANTECUBITAL  Final   Special Requests   Final    BOTTLES DRAWN AEROBIC AND ANAEROBIC Blood Culture results may not be optimal due to an inadequate volume of blood received in culture bottles   Culture  Setup Time   Final    GRAM NEGATIVE RODS GRAM POSITIVE COCCI IN CHAINS IN BOTH AEROBIC AND  ANAEROBIC BOTTLES CRITICAL RESULT CALLED TO, READ BACK BY AND VERIFIED WITH: PHARMD D WOFFORD [id number] AT 1046 BY CM    Culture (A)  Final    ENTEROCOCCUS FAECALIS ENTEROBACTER CLOACAE REISOLATING ENTEROBACTER CLOACAE    Report Status PENDING  Incomplete   Organism ID, Bacteria ENTEROCOCCUS FAECALIS  Final      Susceptibility   Enterococcus faecalis - MIC*    AMPICILLIN <=2 SENSITIVE Sensitive     VANCOMYCIN 2 SENSITIVE Sensitive     GENTAMICIN SYNERGY Value in next row Sensitive      SENSITIVEPerformed at [facility], [address], [place], [place] [address]    * ENTEROCOCCUS FAECALIS  Blood Culture ID Panel (Reflexed)     Status: Abnormal   Collection Time: 04/04/22  1:42 PM  Result Value Ref Range Status   Enterococcus faecalis DETECTED (A) NOT DETECTED Final    Comment: CRITICAL RESULT CALLED TO, READ BACK BY AND VERIFIED WITH: PHARMD D WOFFORD [id number] AT 1046 AM BY CM    Enterococcus Faecium NOT DETECTED NOT DETECTED Final   Listeria monocytogenes NOT DETECTED NOT DETECTED  Final   Staphylococcus species NOT DETECTED NOT DETECTED Final   Staphylococcus aureus (BCID) NOT DETECTED NOT DETECTED Final   Staphylococcus epidermidis NOT DETECTED NOT DETECTED Final   Staphylococcus lugdunensis NOT DETECTED NOT DETECTED Final   Streptococcus species NOT DETECTED NOT DETECTED Final   Streptococcus agalactiae NOT DETECTED NOT DETECTED Final   Streptococcus pneumoniae NOT DETECTED NOT DETECTED Final   Streptococcus pyogenes NOT DETECTED NOT DETECTED Final   A.calcoaceticus-baumannii NOT DETECTED NOT DETECTED Final   Bacteroides fragilis NOT DETECTED NOT DETECTED Final   Enterobacterales DETECTED (A) NOT DETECTED Final    Comment: Enterobacterales represent a large order of gram negative bacteria, not a single organism. CRITICAL RESULT CALLED TO, READ BACK BY AND VERIFIED WITH: PHARMD D WOFFORD [id number] AT 1046 AM BY CM    Enterobacter cloacae complex DETECTED (A) NOT DETECTED  Final    Comment: CRITICAL RESULT CALLED TO, READ BACK BY AND VERIFIED WITH: PHARMD D WOFFORD [id number] AT 1046 AM BY CM    Escherichia coli NOT DETECTED NOT DETECTED Final   Klebsiella aerogenes NOT DETECTED NOT DETECTED Final   Klebsiella oxytoca NOT DETECTED NOT DETECTED Final   Klebsiella pneumoniae NOT DETECTED NOT DETECTED Final   Proteus species NOT DETECTED NOT DETECTED Final   Salmonella species NOT DETECTED NOT DETECTED Final   Serratia marcescens NOT DETECTED NOT DETECTED Final   Haemophilus influenzae NOT DETECTED NOT DETECTED Final   Neisseria meningitidis NOT DETECTED NOT DETECTED Final   Pseudomonas aeruginosa NOT DETECTED NOT DETECTED Final   Stenotrophomonas maltophilia NOT DETECTED NOT DETECTED Final   Candida albicans NOT DETECTED NOT DETECTED Final   Candida auris NOT DETECTED NOT DETECTED Final   Candida glabrata NOT DETECTED NOT DETECTED Final   Candida krusei NOT DETECTED NOT DETECTED Final   Candida parapsilosis NOT DETECTED NOT DETECTED Final   Candida tropicalis NOT DETECTED NOT DETECTED Final   Cryptococcus neoformans/gattii NOT DETECTED NOT DETECTED Final   CTX-M ESBL NOT DETECTED NOT DETECTED Final   Carbapenem resistance IMP NOT DETECTED NOT DETECTED Final   Carbapenem resistance KPC NOT DETECTED NOT DETECTED Final   Carbapenem resistance NDM NOT DETECTED NOT DETECTED Final   Carbapenem resist OXA 48 LIKE NOT DETECTED NOT DETECTED Final   Vancomycin resistance NOT DETECTED NOT DETECTED Final   Carbapenem resistance VIM NOT DETECTED NOT DETECTED Final    Comment: Performed at [facility], [address], [place], [place] [address]  Blood Culture (routine x 2)     Status: Abnormal (Preliminary result)   Collection Time: 04/04/22  1:45 PM   Specimen: BLOOD  Result Value Ref Range Status   Specimen Description   Final    BLOOD RIGHT ANTECUBITAL Performed at [place] [facility] [address], [place], [place] [address]    Special  Requests   Final    BOTTLES DRAWN AEROBIC AND ANAEROBIC Blood Culture adequate volume Performed at [place] [address] [place], [place] [address]    Culture  Setup Time   Final    GRAM NEGATIVE RODS IN BOTH AEROBIC AND ANAEROBIC BOTTLES CRITICAL VALUE NOTED.  VALUE IS CONSISTENT WITH PREVIOUSLY REPORTED AND CALLED VALUE. Performed at [facility], [place] [address] [place],  [address]    Culture ENTEROBACTER CLOACAE (A)  Final   Report Status PENDING  Incomplete  Carbapenem Resistance Panel     Status: None   Collection Time: 04/04/22  4:00 PM  Result Value Ref Range Status  Carba Resistance IMP Gene NOT DETECTED NOT DETECTED Final   Carba Resistance VIM Gene NOT DETECTED NOT DETECTED Final   Carba Resistance NDM Gene NOT DETECTED NOT DETECTED Final   Carba Resistance KPC Gene NOT DETECTED NOT DETECTED Final   Carba Resistance OXA48 Gene NOT DETECTED NOT DETECTED Final    Comment: (NOTE) Cepheid Carba-R is an FDA-cleared nucleic acid amplification test  (NAAT)for the detection and differentiation of genes encoding the  most prevalent carbapenemases in bacterial isolate samples. Carbapenemase gene identification and implementation of comprehensive  infection control measures are recommended by the CDC to prevent the  spread of the resistant organisms. Performed at [facility], [place] [address] [place], [place] [address]   Resp Panel by RT-PCR (Flu A&B, Covid)     Status: None   Collection Time: 04/04/22  4:22 PM   Specimen: Nasal Swab  Result Value Ref Range Status   SARS Coronavirus 2 by RT PCR NEGATIVE NEGATIVE Final    Comment: (NOTE) SARS-CoV-2 target nucleic acids are NOT DETECTED.  The SARS-CoV-2 RNA is generally detectable in upper respiratory specimens during the acute phase of infection. The lowest concentration of SARS-CoV-2 viral copies this assay can detect is 138 copies/mL. A negative result does not preclude  SARS-Cov-2 infection and should not be used as the sole basis for treatment or other patient management decisions. A negative result may occur with  improper specimen collection/handling, submission of specimen other than nasopharyngeal swab, presence of viral mutation(s) within the areas targeted by this assay, and inadequate number of viral copies(<138 copies/mL). A negative result must be combined with clinical observations, patient history, and epidemiological information. The expected result is Negative.  Fact Sheet for Patients:  EntrepreneurPulse.com.au  Fact Sheet for Healthcare Providers:  IncredibleEmployment.be  This test is no t yet approved or cleared by the [place] FDA and  has been authorized for detection and/or diagnosis of SARS-CoV-2 by FDA under an Emergency Use Authorization (EUA). This EUA will remain  in effect (meaning this test can be used) for the duration of the COVID-19 declaration under Section 564(b)(1) of the Act, 21 U.S.C.section 360bbb-3(b)(1), unless the authorization is terminated  or revoked sooner.       Influenza A by PCR NEGATIVE NEGATIVE Final   Influenza B by PCR NEGATIVE NEGATIVE Final    Comment: (NOTE) The Xpert Xpress SARS-CoV-2/FLU/RSV plus assay is intended as an aid in the diagnosis of influenza from Nasopharyngeal swab specimens and should not be used as a sole basis for treatment. Nasal washings and aspirates are unacceptable for Xpert Xpress SARS-CoV-2/FLU/RSV testing.  Fact Sheet for Patients: EntrepreneurPulse.com.au  Fact Sheet for Healthcare Providers: IncredibleEmployment.be  This test is not yet approved or cleared by the [place] FDA and has been authorized for detection and/or diagnosis of SARS-CoV-2 by FDA under an Emergency Use Authorization (EUA). This EUA will remain in effect (meaning this test can be used) for the duration of  the COVID-19 declaration under Section 564(b)(1) of the Act, 21 U.S.C. section 360bbb-3(b)(1), unless the authorization is terminated or revoked.  Performed at [facility], [place] [address] [place], [place] [address]   Culture, blood (Routine X 2) w Reflex to ID Panel     Status: None (Preliminary result)   Collection Time: 04/05/22  2:48 PM   Specimen: BLOOD RIGHT ARM  Result Value Ref Range Status   Specimen Description BLOOD RIGHT ARM  Final   Special Requests   Final  BOTTLES DRAWN AEROBIC AND ANAEROBIC Blood Culture adequate volume   Culture  Setup Time   Final    GRAM NEGATIVE RODS ANAEROBIC BOTTLE ONLY CRITICAL RESULT CALLED TO, READ BACK BY AND VERIFIED WITH: PHARMD JUSTIN LEGGE [id number] FCP CRITICAL VALUE NOTED.  VALUE IS CONSISTENT WITH PREVIOUSLY REPORTED AND CALLED VALUE. Performed at [facility], [place] [address] [place], [place] [address]    Culture GRAM NEGATIVE RODS  Final   Report Status PENDING  Incomplete  Culture, blood (Routine X 2) w Reflex to ID Panel     Status: None (Preliminary result)   Collection Time: 04/05/22  2:53 PM   Specimen: BLOOD LEFT ARM  Result Value Ref Range Status   Specimen Description BLOOD LEFT ARM  Final   Special Requests   Final    BOTTLES DRAWN AEROBIC ONLY Blood Culture results may not be optimal due to an inadequate volume of blood received in culture bottles   Culture   Final    NO GROWTH 3 DAYS Performed at [facility], [place] [address] [place], [place] [address]    Report Status PENDING  Incomplete    Microbiology: Recent Results (from the past 240 hour(s))  Urine Culture     Status: Abnormal   Collection Time: 04/04/22  1:37 PM   Specimen: In/Out Cath Urine  Result Value Ref Range Status   Specimen Description   Final    IN/OUT CATH URINE Performed at [facility], [place] [address] [place], [place] [address]    Special Requests   Final    NONE Performed at  [facility], [place] [address] [place], [place] [address]    Culture (A)  Final    >=100,000 COLONIES/mL ENTEROBACTER CLOACAE 80,000 COLONIES/mL ENTEROCOCCUS FAECALIS    Report Status 04/07/2022 FINAL  Final   Organism ID, Bacteria ENTEROBACTER CLOACAE (A)  Final   Organism ID, Bacteria ENTEROCOCCUS FAECALIS (A)  Final      Susceptibility   Enterobacter cloacae - MIC*    CEFAZOLIN >=64 RESISTANT Resistant     CEFEPIME 8 INTERMEDIATE Intermediate     CIPROFLOXACIN <=0.25 SENSITIVE Sensitive     GENTAMICIN <=1 SENSITIVE Sensitive     IMIPENEM 1 SENSITIVE Sensitive     NITROFURANTOIN <=16 SENSITIVE Sensitive     TRIMETH/SULFA <=20 SENSITIVE Sensitive     PIP/TAZO >=128 RESISTANT Resistant     * >=100,000 COLONIES/mL ENTEROBACTER CLOACAE   Enterococcus faecalis - MIC*    AMPICILLIN <=2 SENSITIVE Sensitive     NITROFURANTOIN <=16 SENSITIVE Sensitive     VANCOMYCIN 2 SENSITIVE Sensitive     * 80,000 COLONIES/mL ENTEROCOCCUS FAECALIS  Blood Culture (routine x 2)     Status: Abnormal (Preliminary result)   Collection Time: 04/04/22  1:42 PM   Specimen: BLOOD  Result Value Ref Range Status   Specimen Description BLOOD LEFT ANTECUBITAL  Final   Special Requests   Final    BOTTLES DRAWN AEROBIC AND ANAEROBIC Blood Culture results may not be optimal due to an inadequate volume of blood received in culture bottles   Culture  Setup Time   Final    GRAM NEGATIVE RODS GRAM POSITIVE COCCI IN CHAINS IN BOTH AEROBIC AND ANAEROBIC BOTTLES CRITICAL RESULT CALLED TO, READ BACK BY AND VERIFIED WITH: PHARMD D WOFFORD [id number] AT 48 BY CM    Culture (A)  Final    ENTEROCOCCUS FAECALIS ENTEROBACTER CLOACAE REISOLATING ENTEROBACTER CLOACAE    Report Status  PENDING  Incomplete   Organism ID, Bacteria ENTEROCOCCUS FAECALIS  Final      Susceptibility   Enterococcus faecalis - MIC*    AMPICILLIN <=2 SENSITIVE Sensitive     VANCOMYCIN 2 SENSITIVE Sensitive     GENTAMICIN SYNERGY  Value in next row Sensitive      SENSITIVEPerformed at [place] [address] [place], [place] [address]    * ENTEROCOCCUS FAECALIS  Blood Culture ID Panel (Reflexed)     Status: Abnormal   Collection Time: 04/04/22  1:42 PM  Result Value Ref Range Status   Enterococcus faecalis DETECTED (A) NOT DETECTED Final    Comment: CRITICAL RESULT CALLED TO, READ BACK BY AND VERIFIED WITH: PHARMD D WOFFORD [id number] AT 1046 AM BY CM    Enterococcus Faecium NOT DETECTED NOT DETECTED Final   Listeria monocytogenes NOT DETECTED NOT DETECTED Final   Staphylococcus species NOT DETECTED NOT DETECTED Final   Staphylococcus aureus (BCID) NOT DETECTED NOT DETECTED Final   Staphylococcus epidermidis NOT DETECTED NOT DETECTED Final   Staphylococcus lugdunensis NOT DETECTED NOT DETECTED Final   Streptococcus species NOT DETECTED NOT DETECTED Final   Streptococcus agalactiae NOT DETECTED NOT DETECTED Final   Streptococcus pneumoniae NOT DETECTED NOT DETECTED Final   Streptococcus pyogenes NOT DETECTED NOT DETECTED Final   A.calcoaceticus-baumannii NOT DETECTED NOT DETECTED Final   Bacteroides fragilis NOT DETECTED NOT DETECTED Final   Enterobacterales DETECTED (A) NOT DETECTED Final    Comment: Enterobacterales represent a large order of gram negative bacteria, not a single organism. CRITICAL RESULT CALLED TO, READ BACK BY AND VERIFIED WITH: PHARMD D WOFFORD [id number] AT 1046 AM BY CM    Enterobacter cloacae complex DETECTED (A) NOT DETECTED Final    Comment: CRITICAL RESULT CALLED TO, READ BACK BY AND VERIFIED WITH: PHARMD D WOFFORD [id number] AT 1046 AM BY CM    Escherichia coli NOT DETECTED NOT DETECTED Final   Klebsiella aerogenes NOT DETECTED NOT DETECTED Final   Klebsiella oxytoca NOT DETECTED NOT DETECTED Final   Klebsiella pneumoniae NOT DETECTED NOT DETECTED Final   Proteus species NOT DETECTED NOT DETECTED Final   Salmonella species NOT DETECTED NOT DETECTED Final   Serratia marcescens NOT  DETECTED NOT DETECTED Final   Haemophilus influenzae NOT DETECTED NOT DETECTED Final   Neisseria meningitidis NOT DETECTED NOT DETECTED Final   Pseudomonas aeruginosa NOT DETECTED NOT DETECTED Final   Stenotrophomonas maltophilia NOT DETECTED NOT DETECTED Final   Candida albicans NOT DETECTED NOT DETECTED Final   Candida auris NOT DETECTED NOT DETECTED Final   Candida glabrata NOT DETECTED NOT DETECTED Final   Candida krusei NOT DETECTED NOT DETECTED Final   Candida parapsilosis NOT DETECTED NOT DETECTED Final   Candida tropicalis NOT DETECTED NOT DETECTED Final   Cryptococcus neoformans/gattii NOT DETECTED NOT DETECTED Final   CTX-M ESBL NOT DETECTED NOT DETECTED Final   Carbapenem resistance IMP NOT DETECTED NOT DETECTED Final   Carbapenem resistance KPC NOT DETECTED NOT DETECTED Final   Carbapenem resistance NDM NOT DETECTED NOT DETECTED Final   Carbapenem resist OXA 48 LIKE NOT DETECTED NOT DETECTED Final   Vancomycin resistance NOT DETECTED NOT DETECTED Final   Carbapenem resistance VIM NOT DETECTED NOT DETECTED Final    Comment: Performed at [facility], [address], [place], [place] [address]  Blood Culture (routine x 2)     Status: Abnormal (Preliminary result)   Collection Time: 04/04/22  1:45 PM   Specimen: BLOOD  Result Value  Ref Range Status   Specimen Description   Final    BLOOD RIGHT ANTECUBITAL Performed at [facility] [address], [place], [place] [address]    Special Requests   Final    BOTTLES DRAWN AEROBIC AND ANAEROBIC Blood Culture adequate volume Performed at [facility] [address] [place], [place] [address]    Culture  Setup Time   Final    GRAM NEGATIVE RODS IN BOTH AEROBIC AND ANAEROBIC BOTTLES CRITICAL VALUE NOTED.  VALUE IS CONSISTENT WITH PREVIOUSLY REPORTED AND CALLED VALUE. Performed at [facility], [place] [address] [place], [place] [address]    Culture ENTEROBACTER CLOACAE (A)   Final   Report Status PENDING  Incomplete  Carbapenem Resistance Panel     Status: None   Collection Time: 04/04/22  4:00 PM  Result Value Ref Range Status   Carba Resistance IMP Gene NOT DETECTED NOT DETECTED Final   Carba Resistance VIM Gene NOT DETECTED NOT DETECTED Final   Carba Resistance NDM Gene NOT DETECTED NOT DETECTED Final   Carba Resistance KPC Gene NOT DETECTED NOT DETECTED Final   Carba Resistance OXA48 Gene NOT DETECTED NOT DETECTED Final    Comment: (NOTE) Cepheid Carba-R is an FDA-cleared nucleic acid amplification test  (NAAT)for the detection and differentiation of genes encoding the  most prevalent carbapenemases in bacterial isolate samples. Carbapenemase gene identification and implementation of comprehensive  infection control measures are recommended by the CDC to prevent the  spread of the resistant organisms. Performed at [facility], [place] [address], [place],  [address]   Resp Panel by RT-PCR (Flu A&B, Covid)     Status: None   Collection Time: 04/04/22  4:22 PM   Specimen: Nasal Swab  Result Value Ref Range Status   SARS Coronavirus 2 by RT PCR NEGATIVE NEGATIVE Final    Comment: (NOTE) SARS-CoV-2 target nucleic acids are NOT DETECTED.  The SARS-CoV-2 RNA is generally detectable in upper respiratory specimens during the acute phase of infection. The lowest concentration of SARS-CoV-2 viral copies this assay can detect is 138 copies/mL. A negative result does not preclude SARS-Cov-2 infection and should not be used as the sole basis for treatment or other patient management decisions. A negative result may occur with  improper specimen collection/handling, submission of specimen other than nasopharyngeal swab, presence of viral mutation(s) within the areas targeted by this assay, and inadequate number of viral copies(<138 copies/mL). A negative result must be combined with clinical observations, patient history, and  epidemiological information. The expected result is Negative.  Fact Sheet for Patients:  EntrepreneurPulse.com.au  Fact Sheet for Healthcare Providers:  IncredibleEmployment.be  This test is no t yet approved or cleared by the [place] FDA and  has been authorized for detection and/or diagnosis of SARS-CoV-2 by FDA under an Emergency Use Authorization (EUA). This EUA will remain  in effect (meaning this test can be used) for the duration of the COVID-19 declaration under Section 564(b)(1) of the Act, 21 U.S.C.section 360bbb-3(b)(1), unless the authorization is terminated  or revoked sooner.       Influenza A by PCR NEGATIVE NEGATIVE Final   Influenza B by PCR NEGATIVE NEGATIVE Final    Comment: (NOTE) The Xpert Xpress SARS-CoV-2/FLU/RSV plus assay is intended as an aid in the diagnosis of influenza from Nasopharyngeal swab specimens and should not be used as a sole basis for treatment. Nasal washings and aspirates are unacceptable for Xpert Xpress SARS-CoV-2/FLU/RSV testing.  Fact Sheet for Patients: EntrepreneurPulse.com.au  Fact Sheet for Healthcare Providers: IncredibleEmployment.be  This test is not yet approved or cleared by the [place] FDA and has been authorized for detection and/or diagnosis of SARS-CoV-2 by FDA under an Emergency Use Authorization (EUA). This EUA will remain in effect (meaning this test can be used) for the duration of the COVID-19 declaration under Section 564(b)(1) of the Act, 21 U.S.C. section 360bbb-3(b)(1), unless the authorization is terminated or revoked.  Performed at [facility], [place] [address], [place], [place] [address]   Culture, blood (Routine X 2) w Reflex to ID Panel     Status: None (Preliminary result)   Collection Time: 04/05/22  2:48 PM   Specimen: BLOOD RIGHT ARM  Result Value Ref Range Status   Specimen Description BLOOD  RIGHT ARM  Final   Special Requests   Final    BOTTLES DRAWN AEROBIC AND ANAEROBIC Blood Culture adequate volume   Culture  Setup Time   Final    GRAM NEGATIVE RODS ANAEROBIC BOTTLE ONLY CRITICAL RESULT CALLED TO, READ BACK BY AND VERIFIED WITH: PHARMD JUSTIN LEGGE [phone] FCP CRITICAL VALUE NOTED.  VALUE IS CONSISTENT WITH PREVIOUSLY REPORTED AND CALLED VALUE. Performed at [facility], [place] [address], [place], [place] [address]    Culture GRAM NEGATIVE RODS  Final   Report Status PENDING  Incomplete  Culture, blood (Routine X 2) w Reflex to ID Panel     Status: None (Preliminary result)   Collection Time: 04/05/22  2:53 PM   Specimen: BLOOD LEFT ARM  Result Value Ref Range Status   Specimen Description BLOOD LEFT ARM  Final   Special Requests   Final    BOTTLES DRAWN AEROBIC ONLY Blood Culture results may not be optimal due to an inadequate volume of blood received in culture bottles   Culture   Final    NO GROWTH 3 DAYS Performed at [facility], [place] [address], [place], [place] [address]    Report Status PENDING  Incomplete    Radiographs and labs were personally reviewed by me.   Bobby Rumpf, MD [facility] for Infectious Disease [facility] [phone] 04/08/2022, 9:13 AM

## 2022-04-08 NOTE — Evaluation (Signed)
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 recurrent hospitalization due to UTI brought to the emergency room with lethargy, Foley catheter with foul-smelling urine. admitted with UTI, sepsis, AKI and malfunctioning Foley catheter.   Clinical Impression   Theodore King is a 76 year old admitted to hospital with above medical history and presents with generalized weakness, decreased activity tolerance, impaired balance, and baseline cognitive impairments. Patient needing min assist to ambulate with walker and increased assistance with ADLs including mod-max assist forADLs. Patient will benefit from skilled OT services while in hospital to improve deficits and learn compensatory strategies as needed in order to return to PLOF. Per case management note patient has 24/7 assist at home. Recommend return home with PACE services.        Recommendations for follow up therapy are one component of a multi-disciplinary discharge planning process, led by the attending physician.  Recommendations may be updated based on patient status, additional functional criteria and insurance authorization.   Follow Up Recommendations  Other (comment) (Return home with PACE services)    Assistance Recommended at Discharge    Patient can return home with the following A little help with walking and/or transfers;A lot of help with bathing/dressing/bathroom;Assistance with cooking/housework;Direct supervision/assist for medications management;Direct supervision/assist for financial management;Help with stairs or ramp for entrance    Functional Status Assessment  Patient has had a recent decline in their functional status  and/or demonstrates limited ability to make significant improvements in function in a reasonable and predictable amount of time  Equipment Recommendations  None recommended by OT    Recommendations for Other Services       Precautions / Restrictions Precautions Precautions: Fall Restrictions Weight Bearing Restrictions: No      Mobility Bed Mobility Overal bed mobility: Needs Assistance Bed Mobility: Supine to Sit     Supine to sit: Min assist, +2 for safety/equipment, HOB elevated     General bed mobility comments: Min assist to initiate lower extremities    Transfers Overall transfer level: Needs assistance Equipment used: Rolling walker (2 wheels) Transfers: Sit to/from Stand Sit to Stand: Min assist, From elevated surface, +2 safety/equipment           General transfer comment: Min assist to stand. exhibits a posterior lean immediately. Min assist to reduce intermittent posterior lean with ambulation in hallway with walker. Min assist for walker management.      Balance Overall balance assessment: Needs assistance Sitting-balance support: No upper extremity supported, Feet supported Sitting balance-Leahy Scale: Fair     Standing balance support: During functional activity, Bilateral upper extremity supported Standing balance-Leahy Scale: Poor                             ADL either performed or assessed with clinical judgement   ADL Overall ADL's : Needs assistance/impaired Eating/Feeding: Set up;Sitting   Grooming: Set up;Oral care;Wash/dry face;Sitting Grooming Details (indicate cue type and reason): in recliner Upper Body Bathing: Minimal assistance;Sitting   Lower Body Bathing: Moderate assistance;Sit to/from stand   Upper Body Dressing : Sitting;Moderate assistance   Lower Body Dressing: Maximal assistance;Sit to/from stand   Toilet Transfer: Minimal assistance;Rolling walker (2 wheels);Regular Toilet;Grab bars   Toileting-  Clothing Manipulation and Hygiene: Maximal  assistance;Sit to/from stand       Functional mobility during ADLs: Minimal assistance;+2 for safety/equipment       Vision   Vision Assessment?: No apparent visual deficits     Perception     Praxis      Pertinent Vitals/Pain Pain Assessment Pain Assessment: No/denies pain     Hand Dominance Left   Extremity/Trunk Assessment Upper Extremity Assessment Upper Extremity Assessment: Overall WFL for tasks assessed   Lower Extremity Assessment Lower Extremity Assessment: Defer to PT evaluation   Cervical / Trunk Assessment Cervical / Trunk Assessment: Kyphotic   Communication Communication Communication: No difficulties   Cognition Arousal/Alertness: Awake/alert Behavior During Therapy: WFL for tasks assessed/performed Overall Cognitive Status: History of cognitive impairments - at baseline                                 General Comments: Able to follow simple commands.     General Comments       Exercises     Shoulder Instructions      Home Living Family/patient expects to be discharged to:: Private residence Living Arrangements: Children Available Help at Discharge: Family;Available PRN/intermittently Type of Home: House Home Access: Stairs to enter [employer] of Steps: 3 Entrance Stairs-Rails: Right;Left Home Layout: One level         Bathroom Toilet: Standard Bathroom Accessibility: Yes   Home Equipment: Conservation officer, nature (2 wheels);Cane - single point;Grab bars - tub/shower;BSC/3in1   Additional Comments: daughter works during day. Pt goes to 2-3 days a week [facility] 8 hours/wk. (info above from previous admission)      Prior Functioning/Environment Prior Level of Function : Independent/Modified Independent;Needs assist             Mobility Comments: pt unable to provide info, previously able to amb ADLs Comments: Assist with bathing, dressing assist; able to feed self and  perform grooming--per previous notes        OT Problem List: Decreased activity tolerance;Impaired balance (sitting and/or standing);Decreased cognition;Decreased safety awareness;Decreased knowledge of use of DME or AE;Decreased strength      OT Treatment/Interventions: Self-care/ADL training;DME and/or AE instruction;Therapeutic activities;Patient/family education    OT Goals(Current goals can be found in the care plan section) Acute Rehab OT Goals OT Goal Formulation: Patient unable to participate in goal setting Time For Goal Achievement: 04/22/22 Potential to Achieve Goals: Fair  OT Frequency: Min 2X/week    Co-evaluation              AM-PAC OT "6 Clicks" Daily Activity     Outcome Measure Help from another person eating meals?: A Little Help from another person taking care of personal grooming?: A Little Help from another person toileting, which includes using toliet, bedpan, or urinal?: A Lot Help from another person bathing (including washing, rinsing, drying)?: A Lot Help from another person to put on and taking off regular upper body clothing?: A Lot Help from another person to put on and taking off regular lower body clothing?: A Lot 6 Click Score: 14   End of Session Equipment Utilized During Treatment: Rolling walker (2 wheels);Gait belt Nurse Communication: Mobility status  Activity Tolerance: Patient tolerated treatment well Patient left: in chair;with call bell/phone within reach;with chair alarm set  OT Visit Diagnosis: Unsteadiness on feet (R26.81)                Time: 2956-2130 OT Time Calculation (min): 14 min Charges:  OT General Charges $OT Visit: 1 Visit OT Evaluation $OT Eval Low Complexity: 1 Low  Gustavo Lah, OTR/L [place]  Office [phone]   Theodore King 04/08/2022, 1:52 PM

## 2022-04-08 NOTE — Progress Notes (Signed)
PHARMACY NOTE:  ANTIMICROBIAL RENAL DOSAGE ADJUSTMENT  Current antimicrobial regimen includes a mismatch between antimicrobial dosage and estimated renal function.  As per policy approved by the Pharmacy & Therapeutics and Medical Executive Committees, the antimicrobial dosage will be adjusted accordingly.  Current antimicrobial dosage:  Ampicillin 2g IV every 8 hours  Indication: E faecalis bacteremia  Renal Function:  Estimated Creatinine Clearance: 33.9 mL/min (A) (by C-G formula based on SCr of 1.96 mg/dL (H)). '[]'$      On intermittent HD, scheduled: '[]'$      On CRRT    Antimicrobial dosage has been changed to:  Ampicillin 2g IV every 6 hours  Additional comments:   Thank you for allowing pharmacy to be a part of this patient's care.  Alycia Rossetti, PharmD, BCPS Infectious Diseases Clinical Pharmacist 04/08/2022 10:24 AM   **Pharmacist phone directory can now be found on [email or web address] (PW [email or web address]).  Listed under [place].

## 2022-04-08 NOTE — Plan of Care (Signed)

## 2022-04-08 NOTE — Progress Notes (Signed)
PHARMACY - PHYSICIAN COMMUNICATION CRITICAL VALUE ALERT - BLOOD CULTURE IDENTIFICATION (BCID)  Theodore King is an 76 y.o. male who presented to [facility] on 04/04/2022 with malfunctioning foley catheter, sepsis 2nd UTI  Assessment:  Repeat Blood cultures from 10/30 growing GNR in 1 of 3 sets.   Name of physician (or Provider) Contacted: Dr. Sloan Leiter. ID consulted alongside  Current antibiotics:  - Merrem 1gm IV q 12h - Ampicillin 2gm IV q 8h  Changes to prescribed antibiotics recommended:  - Continue current regimen  - Follow up speciation and sensitivities  Results for orders placed or performed during the hospital encounter of 04/04/22  Blood Culture ID Panel (Reflexed) (Collected: 04/04/2022  1:42 PM)  Result Value Ref Range   Enterococcus faecalis DETECTED (A) NOT DETECTED   Enterococcus Faecium NOT DETECTED NOT DETECTED   Listeria monocytogenes NOT DETECTED NOT DETECTED   Staphylococcus species NOT DETECTED NOT DETECTED   Staphylococcus aureus (BCID) NOT DETECTED NOT DETECTED   Staphylococcus epidermidis NOT DETECTED NOT DETECTED   Staphylococcus lugdunensis NOT DETECTED NOT DETECTED   Streptococcus species NOT DETECTED NOT DETECTED   Streptococcus agalactiae NOT DETECTED NOT DETECTED   Streptococcus pneumoniae NOT DETECTED NOT DETECTED   Streptococcus pyogenes NOT DETECTED NOT DETECTED   A.calcoaceticus-baumannii NOT DETECTED NOT DETECTED   Bacteroides fragilis NOT DETECTED NOT DETECTED   Enterobacterales DETECTED (A) NOT DETECTED   Enterobacter cloacae complex DETECTED (A) NOT DETECTED   Escherichia coli NOT DETECTED NOT DETECTED   Klebsiella aerogenes NOT DETECTED NOT DETECTED   Klebsiella oxytoca NOT DETECTED NOT DETECTED   Klebsiella pneumoniae NOT DETECTED NOT DETECTED   Proteus species NOT DETECTED NOT DETECTED   Salmonella species NOT DETECTED NOT DETECTED   Serratia marcescens NOT DETECTED NOT DETECTED   Haemophilus influenzae NOT DETECTED NOT DETECTED    Neisseria meningitidis NOT DETECTED NOT DETECTED   Pseudomonas aeruginosa NOT DETECTED NOT DETECTED   Stenotrophomonas maltophilia NOT DETECTED NOT DETECTED   Candida albicans NOT DETECTED NOT DETECTED   Candida auris NOT DETECTED NOT DETECTED   Candida glabrata NOT DETECTED NOT DETECTED   Candida krusei NOT DETECTED NOT DETECTED   Candida parapsilosis NOT DETECTED NOT DETECTED   Candida tropicalis NOT DETECTED NOT DETECTED   Cryptococcus neoformans/gattii NOT DETECTED NOT DETECTED   CTX-M ESBL NOT DETECTED NOT DETECTED   Carbapenem resistance IMP NOT DETECTED NOT DETECTED   Carbapenem resistance KPC NOT DETECTED NOT DETECTED   Carbapenem resistance NDM NOT DETECTED NOT DETECTED   Carbapenem resist OXA 48 LIKE NOT DETECTED NOT DETECTED   Vancomycin resistance NOT DETECTED NOT DETECTED   Carbapenem resistance VIM NOT DETECTED NOT DETECTED    [name] 04/08/2022  8:44 AM

## 2022-04-08 NOTE — TOC Initial Note (Addendum)
Transition of Care [facility]) - Initial/Assessment Note    Patient Details  Name: Theodore King MRN: [id number] Date of Birth: 05-04-1946  [facility] CM/SW Contact:    Vassie Moselle, LCSW Phone Number: 04/08/2022, 9:58 AM  Clinical Narrative:                 Spoke with pt's daughter over the phone and confirmed [facility] services. Pt has 24/7 care throughout the week per daughter. She states she works during the day and evenings however, pt has RN that comes into the home twice a day and pt goes to [employer] program M-F. She is agreeable to home health services being arranged for this pt.  CSW contacted [facility] and left a voicemail for Stanton Kidney to discuss home health recommendations. Currently awaiting a return call.   Update 12:07pm- CSW spoke with Caryl Asp [phone]) at [facility] who shares that this pt has in home care from 9am to 11am and then spends the rest of the day at [facility] where he receives PT services. Pt then receives aide services in the evenings when he returns home. Pt will continue with these services at discharge.   Expected Discharge Plan: [place] Barriers to Discharge: No Barriers Identified   Patient Goals and CMS Choice Patient states their goals for this hospitalization and ongoing recovery are:: For pt to return home per daughter CMS Medicare.gov Compare Post Acute Care list provided to:: Legal Guardian (Daughter) Choice offered to / list presented to : [facility] POA / Guardian, Adult Children  Expected Discharge Plan and Services Expected Discharge Plan: [place] In-house Referral: NA Discharge Planning Services: CM Consult Post Acute Care Choice: Home Health, Resumption of Svcs/PTA Provider Living arrangements for the past 2 months: Single Family Home                 DME Arranged: N/A DME Agency: NA                  Prior Living Arrangements/Services Living arrangements for the past 2 months:  Single Family Home Lives with:: Adult Children Patient language and need for interpreter reviewed:: Yes Do you feel safe going back to the place where you live?: Yes      Need for Family Participation in Patient Care: Yes (Comment) Care giver support system in place?: Yes (comment) Current home services: DME, Other (comment) (PACE) Criminal Activity/Legal Involvement Pertinent to Current Situation/Hospitalization: No - Comment as needed  Activities of Daily Living   ADL Screening (condition at time of admission) Patient's cognitive ability adequate to safely complete daily activities?: No Is the patient deaf or have difficulty hearing?: Yes Does the patient have difficulty seeing, even when wearing glasses/contacts?: No Does the patient have difficulty concentrating, remembering, or making decisions?: Yes Patient able to express need for assistance with ADLs?: Yes Does the patient have difficulty dressing or bathing?: Yes Independently performs ADLs?: No Communication: Independent Dressing (OT): Needs assistance Is this a change from baseline?: Pre-admission baseline Grooming: Needs assistance Is this a change from baseline?: Pre-admission baseline Feeding: Needs assistance Is this a change from baseline?: Pre-admission baseline Bathing: Needs assistance Is this a change from baseline?: Pre-admission baseline Toileting: Needs assistance Is this a change from baseline?: Pre-admission baseline In/Out Bed: Needs assistance Is this a change from baseline?: Pre-admission baseline Walks in Home: Needs assistance Is this a change from baseline?: Pre-admission baseline Does the patient have difficulty  walking or climbing stairs?:  (WC bound)  Permission Sought/Granted Permission sought to share information with : Case Manager, Family Supports Permission granted to share information with : No              Emotional Assessment Appearance:: Other (Comment Required (Did not meet with  pt face to face) Attitude/Demeanor/Rapport: Unable to Assess Affect (typically observed): Unable to Assess Orientation: : Oriented to Self, Oriented to Place Alcohol / Substance Use: Not Applicable Psych Involvement: No (comment)  Admission diagnosis:  Urinary retention [R33.9] Elevated troponin [R79.89] Sepsis ([place]) [A41.9] Sepsis with acute renal failure and septic shock, due to unspecified organism, unspecified acute renal failure type ([place]) [A41.9, R65.21, N17.9] Patient Active Problem List   Diagnosis Date Noted   Chronic indwelling Foley catheter    Sepsis ([place]) 04/04/2022   Pressure injury of skin 02/03/2022   Urinary retention 94/70/9628   Acute metabolic encephalopathy 36/62/9476   Foley catheter problem, initial encounter ([place]) 01/31/2022   Pulmonary nodule 01/31/2022   Syncope 11/18/2021   UTI (urinary tract infection) 11/12/2021   Essential hypertension 11/11/2021   Bacteremia 11/10/2021   Hypokalemia 11/09/2021   Normocytic anemia 11/09/2021   Benign neoplasm of transverse colon    Abnormal CT of the abdomen    AKI (acute kidney injury) ([place]) 10/23/2021   Thrombocytopenia ([place]) 10/23/2021   BPH (benign prostatic hyperplasia) 10/23/2021   Hyperbilirubinemia 10/23/2021   Colonic mass 10/23/2021   Aortic atherosclerosis ([place]) 10/23/2021   History of 2019 novel coronavirus disease (COVID-19) 04/24/2019   Dysphonia 10/16/2018   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   Former smoker 08/15/2018   SOB (shortness of breath) 03/23/2018   Vertigo, benign paroxysmal 11/05/2016   Abnormal dreams 09/06/2013   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   Hyperglycemia 05/18/2013   Paralysis agitans 05/18/2013   Vitamin D deficiency 05/18/2013   Hyperlipidemia 05/18/2013   Parkinson's disease ([place]) 10/26/2012   PCP:  [name], MD Pharmacy:   [place], [place] [address] [place] [address] Phone: [phone] Fax: [phone]  [id number] [id number]: [phone]: [phone]    Social Determinants of Health (SDOH) Interventions    Readmission Risk Interventions    04/08/2022    9:56 AM 04/07/2022    8:15 AM  Readmission Risk Prevention Plan  Transportation Screening Complete Complete  Medication Review (RN Care Manager) Complete Complete  PCP or Specialist appointment within 3-5 days of discharge Complete Complete  HRI or Home Care Consult Complete Complete  SW Recovery Care/Counseling Consult Complete Complete  Palliative Care Screening  Not [place] Gardens[place] Applicable Not Applicable

## 2022-04-09 DIAGNOSIS — N179 Acute kidney failure, unspecified: Secondary | ICD-10-CM | POA: Diagnosis not present

## 2022-04-09 DIAGNOSIS — Z978 Presence of other specified devices: Secondary | ICD-10-CM | POA: Diagnosis not present

## 2022-04-09 DIAGNOSIS — N3001 Acute cystitis with hematuria: Secondary | ICD-10-CM

## 2022-04-09 DIAGNOSIS — R6520 Severe sepsis without septic shock: Secondary | ICD-10-CM | POA: Diagnosis not present

## 2022-04-09 DIAGNOSIS — R7881 Bacteremia: Secondary | ICD-10-CM | POA: Diagnosis not present

## 2022-04-09 DIAGNOSIS — A419 Sepsis, unspecified organism: Secondary | ICD-10-CM | POA: Diagnosis not present

## 2022-04-09 LAB — BASIC METABOLIC PANEL
Anion gap: 6 (ref 5–15)
BUN: 29 mg/dL — ABNORMAL HIGH (ref 8–23)
CO2: 24 mmol/L (ref 22–32)
Calcium: 8 mg/dL — ABNORMAL LOW (ref 8.9–10.3)
Chloride: 113 mmol/L — ABNORMAL HIGH (ref 98–111)
Creatinine, Ser: 1.81 mg/dL — ABNORMAL HIGH (ref 0.61–1.24)
GFR, Estimated: 38 mL/min — ABNORMAL LOW (ref >60)
Glucose, Bld: 113 mg/dL — ABNORMAL HIGH (ref 70–99)
Potassium: 3.9 mmol/L (ref 3.5–5.1)
Sodium: 143 mmol/L (ref 135–145)

## 2022-04-09 MED ORDER — LEVOFLOXACIN 500 MG PO TABS
500.0000 mg | ORAL_TABLET | Freq: Every day | ORAL | Status: DC
  Administered 2022-04-09 – 2022-04-11 (×3): 500 mg via ORAL
  Filled 2022-04-09 (×3): qty 1

## 2022-04-09 MED ORDER — AMOXICILLIN 500 MG PO CAPS
1000.0000 mg | ORAL_CAPSULE | Freq: Three times a day (TID) | ORAL | Status: DC
  Administered 2022-04-09 – 2022-04-11 (×6): 1000 mg via ORAL
  Filled 2022-04-09 (×7): qty 2

## 2022-04-09 NOTE — Progress Notes (Signed)
PROGRESS NOTE    Theodore King  ZCH:[id number] DOB: 1946-06-02 DOA: 04/04/2022 PCP: [name], MD    Brief Narrative:  76 year old gentleman with history of Parkinson's and dementia, hyperlipidemia, thrombocytopenia, BPH and chronic urinary retention with indwelling Foley catheter, recurrent hospitalization due to UTI brought to the emergency room with lethargy, Foley catheter with foul-smelling urine.  In the emergency room WBC count 21.3, lactic acid 2.4.  Foley was occluded, exchanged and found to have 800 mL of urine retention.  He was also noted to have acute kidney injury with creatinine of 4.76 at presentation compared to baseline of 1.35 recent.  Admitted with acute UTI, sepsis, AKI and malfunctioning Foley catheter. Patient remains in the hospital due to persistent bacteremia.   Assessment & Plan:   Sepsis due to gram-negative rod, acute UTI present on admission secondary to Foley catheter Enterococcus and gram-negative rod bacteremia Sepsis present on admission due to AKI, leukocytosis.  Foley catheter exchanged and now draining well. Urine culture with Enterococcus cloacea and faecalis Blood cultures 10/29, Enterococcus faecalis and Enterococcus cloacea Blood cultures 10/30, growing Enterobacter again. Patient is currently on ampicillin and meropenem.  Awaiting repeat cultures to turn negative.  Followed by ID.  Clinically improving. Given clinical presentation, likely has prostatitis and will need prolonged antibiotic therapy.  not recommending TEE.  Acute urinary retention, multifactorial acute kidney injury with underlying chronic kidney disease, malfunctioning Foley catheter: discontinue maintenance IV fluid today.  Kidney functions are improving.  Advanced Parkinson's with dementia: On Sinemet.  Fall precautions.  Work with PT OT today.  Lives at home and has adequate support system at home.   DVT prophylaxis: heparin injection 5,000 Units Start: 04/04/22  2200   Code Status: Full code Family Communication: Daughter on the phone 11/1. Disposition Plan: Status is: Inpatient Remains inpatient appropriate because: IV antibiotics, bacteremia,      Consultants:  ID  Procedures:  None  Antimicrobials:  Meropenem and ampicillin 10/29---   Subjective: Patient seen and examined.  Denies any complaints.  No overnight events.  Remains afebrile.  Objective: Vitals:   04/08/22 0446 04/08/22 1331 04/08/22 2020 04/09/22 0605  BP: (!) 151/78 (!) 106/57 (!) 156/64 (!) 159/62  Pulse: (!) 59 65 63 60  Resp: '20 14 18 19  '$ Temp: 98.6 F (37 C) (!) 97.4 F (36.3 C) (!) 97.5 F (36.4 C) 98.6 F (37 C)  TempSrc:  Oral Oral Oral  SpO2: 100% 100% 100% 100%  Weight:      Height:        Intake/Output Summary (Last 24 hours) at 04/09/2022 1114 Last data filed at 04/09/2022 0929 Gross per 24 hour  Intake 2178.79 ml  Output 1800 ml  Net 378.79 ml   Filed Weights   04/04/22 1500  Weight: 77.2 kg    Examination:  General: Looks fairly comfortable at rest.  Flat affect.  Denies any complaints. Patient is alert and awake.  Mostly oriented.  Can move all extremities but deconditioned. Cardiovascular: S1-S2 normal.  Regular rate rhythm. Respiratory: Bilateral clear.  No added sounds. Gastrointestinal: Soft.  Nontender.  Bowel sound present. foley catheter with clear urine.  Minimal white discharge surrounding the Foley catheter. Ext: No deformities.      Data Reviewed: I have personally reviewed following labs and imaging studies  CBC: Recent Labs  Lab 04/04/22 1331 04/05/22 0511 04/06/22 0529 04/07/22 0516  WBC 21.3* 18.4* 9.5 6.3  NEUTROABS 18.0*  --   --   --   HGB  8.5* 7.9* 7.7* 8.0*  HCT 26.4* 24.4* 24.2* 25.8*  MCV 79.8* 78.7* 79.6* 81.6  PLT 148* 153 157 267   Basic Metabolic Panel: Recent Labs  Lab 04/05/22 0511 04/06/22 0529 04/07/22 0516 04/08/22 0906 04/09/22 0912  NA 139 143 144 145 143  K 4.0 4.1 4.2 4.0  3.9  CL 111 116* 117* 115* 113*  CO2 '23 23 23 23 24  '$ GLUCOSE 100* 98 105* 123* 113*  BUN 64* 58* 48* 34* 29*  CREATININE 3.62* 2.76* 2.34* 1.96* 1.81*  CALCIUM 7.5* 7.9* 8.1* 8.1* 8.0*   GFR: Estimated Creatinine Clearance: 36.7 mL/min (A) (by C-G formula based on SCr of 1.81 mg/dL (H)). Liver Function Tests: Recent Labs  Lab 04/05/22 0511  AST 52*  ALT 8  ALKPHOS 73  BILITOT 0.6  PROT 5.7*  ALBUMIN 2.0*   No results for input(s): "LIPASE", "AMYLASE" in the last 168 hours. No results for input(s): "AMMONIA" in the last 168 hours. Coagulation Profile: Recent Labs  Lab 04/04/22 1412  INR 1.3*   Cardiac Enzymes: No results for input(s): "CKTOTAL", "CKMB", "CKMBINDEX", "TROPONINI" in the last 168 hours. BNP (last 3 results) No results for input(s): "PROBNP" in the last 8760 hours. HbA1C: No results for input(s): "HGBA1C" in the last 72 hours. CBG: No results for input(s): "GLUCAP" in the last 168 hours. Lipid Profile: No results for input(s): "CHOL", "HDL", "LDLCALC", "TRIG", "CHOLHDL", "LDLDIRECT" in the last 72 hours. Thyroid Function Tests: No results for input(s): "TSH", "T4TOTAL", "FREET4", "T3FREE", "THYROIDAB" in the last 72 hours. Anemia Panel: No results for input(s): "VITAMINB12", "FOLATE", "FERRITIN", "TIBC", "IRON", "RETICCTPCT" in the last 72 hours.  Sepsis Labs: Recent Labs  Lab 04/04/22 1345 04/04/22 1537  LATICACIDVEN 2.4* 2.2*    Recent Results (from the past 240 hour(s))  Urine Culture     Status: Abnormal   Collection Time: 04/04/22  1:37 PM   Specimen: In/Out Cath Urine  Result Value Ref Range Status   Specimen Description   Final    IN/OUT CATH URINE Performed at [facility] [address] [place], [place] [address]    Special Requests   Final    NONE Performed at [facility], [place] [address] [place], [place] [address]    Culture (A)  Final    >=100,000 COLONIES/mL ENTEROBACTER CLOACAE 80,000  COLONIES/mL ENTEROCOCCUS FAECALIS    Report Status 04/07/2022 FINAL  Final   Organism ID, Bacteria ENTEROBACTER CLOACAE (A)  Final   Organism ID, Bacteria ENTEROCOCCUS FAECALIS (A)  Final      Susceptibility   Enterobacter cloacae - MIC*    CEFAZOLIN >=64 RESISTANT Resistant     CEFEPIME 8 INTERMEDIATE Intermediate     CIPROFLOXACIN <=0.25 SENSITIVE Sensitive     GENTAMICIN <=1 SENSITIVE Sensitive     IMIPENEM 1 SENSITIVE Sensitive     NITROFURANTOIN <=16 SENSITIVE Sensitive     TRIMETH/SULFA <=20 SENSITIVE Sensitive     PIP/TAZO >=128 RESISTANT Resistant     * >=100,000 COLONIES/mL ENTEROBACTER CLOACAE   Enterococcus faecalis - MIC*    AMPICILLIN <=2 SENSITIVE Sensitive     NITROFURANTOIN <=16 SENSITIVE Sensitive     VANCOMYCIN 2 SENSITIVE Sensitive     * 80,000 COLONIES/mL ENTEROCOCCUS FAECALIS  Blood Culture (routine x 2)     Status: Abnormal (Preliminary result)   Collection Time: 04/04/22  1:42 PM   Specimen: BLOOD  Result Value Ref Range Status   Specimen Description BLOOD LEFT ANTECUBITAL  Final  Special Requests   Final    BOTTLES DRAWN AEROBIC AND ANAEROBIC Blood Culture results may not be optimal due to an inadequate volume of blood received in culture bottles   Culture  Setup Time   Final    GRAM NEGATIVE RODS GRAM POSITIVE COCCI IN CHAINS IN BOTH AEROBIC AND ANAEROBIC BOTTLES CRITICAL RESULT CALLED TO, READ BACK BY AND VERIFIED WITH: PHARMD D WOFFORD [id number] AT 1046 BY CM    Culture (A)  Final    ENTEROCOCCUS FAECALIS ENTEROBACTER CLOACAE REISOLATING ENTEROBACTER CLOACAE    Report Status PENDING  Incomplete   Organism ID, Bacteria ENTEROCOCCUS FAECALIS  Final      Susceptibility   Enterococcus faecalis - MIC*    AMPICILLIN <=2 SENSITIVE Sensitive     VANCOMYCIN 2 SENSITIVE Sensitive     GENTAMICIN SYNERGY Value in next row Sensitive      SENSITIVEPerformed at [facility] [address], [place], [place] [address]    * ENTEROCOCCUS FAECALIS  Blood  Culture ID Panel (Reflexed)     Status: Abnormal   Collection Time: 04/04/22  1:42 PM  Result Value Ref Range Status   Enterococcus faecalis DETECTED (A) NOT DETECTED Final    Comment: CRITICAL RESULT CALLED TO, READ BACK BY AND VERIFIED WITH: PHARMD D WOFFORD [id number] AT 1046 AM BY CM    Enterococcus Faecium NOT DETECTED NOT DETECTED Final   Listeria monocytogenes NOT DETECTED NOT DETECTED Final   Staphylococcus species NOT DETECTED NOT DETECTED Final   Staphylococcus aureus (BCID) NOT DETECTED NOT DETECTED Final   Staphylococcus epidermidis NOT DETECTED NOT DETECTED Final   Staphylococcus lugdunensis NOT DETECTED NOT DETECTED Final   Streptococcus species NOT DETECTED NOT DETECTED Final   Streptococcus agalactiae NOT DETECTED NOT DETECTED Final   Streptococcus pneumoniae NOT DETECTED NOT DETECTED Final   Streptococcus pyogenes NOT DETECTED NOT DETECTED Final   A.calcoaceticus-baumannii NOT DETECTED NOT DETECTED Final   Bacteroides fragilis NOT DETECTED NOT DETECTED Final   Enterobacterales DETECTED (A) NOT DETECTED Final    Comment: Enterobacterales represent a large order of gram negative bacteria, not a single organism. CRITICAL RESULT CALLED TO, READ BACK BY AND VERIFIED WITH: PHARMD D WOFFORD [id number] AT 1046 AM BY CM    Enterobacter cloacae complex DETECTED (A) NOT DETECTED Final    Comment: CRITICAL RESULT CALLED TO, READ BACK BY AND VERIFIED WITH: PHARMD D WOFFORD [id number] AT 1046 AM BY CM    Escherichia coli NOT DETECTED NOT DETECTED Final   Klebsiella aerogenes NOT DETECTED NOT DETECTED Final   Klebsiella oxytoca NOT DETECTED NOT DETECTED Final   Klebsiella pneumoniae NOT DETECTED NOT DETECTED Final   Proteus species NOT DETECTED NOT DETECTED Final   Salmonella species NOT DETECTED NOT DETECTED Final   Serratia marcescens NOT DETECTED NOT DETECTED Final   Haemophilus influenzae NOT DETECTED NOT DETECTED Final   Neisseria meningitidis NOT DETECTED NOT DETECTED Final    Pseudomonas aeruginosa NOT DETECTED NOT DETECTED Final   Stenotrophomonas maltophilia NOT DETECTED NOT DETECTED Final   Candida albicans NOT DETECTED NOT DETECTED Final   Candida auris NOT DETECTED NOT DETECTED Final   Candida glabrata NOT DETECTED NOT DETECTED Final   Candida krusei NOT DETECTED NOT DETECTED Final   Candida parapsilosis NOT DETECTED NOT DETECTED Final   Candida tropicalis NOT DETECTED NOT DETECTED Final   Cryptococcus neoformans/gattii NOT DETECTED NOT DETECTED Final   CTX-M ESBL NOT DETECTED NOT DETECTED Final   Carbapenem resistance IMP NOT DETECTED NOT DETECTED Final  Carbapenem resistance KPC NOT DETECTED NOT DETECTED Final   Carbapenem resistance NDM NOT DETECTED NOT DETECTED Final   Carbapenem resist OXA 48 LIKE NOT DETECTED NOT DETECTED Final   Vancomycin resistance NOT DETECTED NOT DETECTED Final   Carbapenem resistance VIM NOT DETECTED NOT DETECTED Final    Comment: Performed at [facility], [place] [address] [place], [place] [address]  Blood Culture (routine x 2)     Status: Abnormal (Preliminary result)   Collection Time: 04/04/22  1:45 PM   Specimen: BLOOD  Result Value Ref Range Status   Specimen Description   Final    BLOOD RIGHT ANTECUBITAL Performed at [facility] [address] [place], [place] [address]    Special Requests   Final    BOTTLES DRAWN AEROBIC AND ANAEROBIC Blood Culture adequate volume Performed at [facility] [address], [place], [place] [address]    Culture  Setup Time   Final    GRAM NEGATIVE RODS IN BOTH AEROBIC AND ANAEROBIC BOTTLES CRITICAL VALUE NOTED.  VALUE IS CONSISTENT WITH PREVIOUSLY REPORTED AND CALLED VALUE. Performed at [facility], [place] [address], [place], [place] [address]    Culture ENTEROBACTER CLOACAE (A)  Final   Report Status PENDING  Incomplete  Carbapenem Resistance Panel     Status: None   Collection Time: 04/04/22  4:00 PM  Result Value Ref  Range Status   Carba Resistance IMP Gene NOT DETECTED NOT DETECTED Final   Carba Resistance VIM Gene NOT DETECTED NOT DETECTED Final   Carba Resistance NDM Gene NOT DETECTED NOT DETECTED Final   Carba Resistance KPC Gene NOT DETECTED NOT DETECTED Final   Carba Resistance OXA48 Gene NOT DETECTED NOT DETECTED Final    Comment: (NOTE) Cepheid Carba-R is an FDA-cleared nucleic acid amplification test  (NAAT)for the detection and differentiation of genes encoding the  most prevalent carbapenemases in bacterial isolate samples. Carbapenemase gene identification and implementation of comprehensive  infection control measures are recommended by the CDC to prevent the  spread of the resistant organisms. Performed at [facility], [place] [address], [place], [place] [address]   Resp Panel by RT-PCR (Flu A&B, Covid)     Status: None   Collection Time: 04/04/22  4:22 PM   Specimen: Nasal Swab  Result Value Ref Range Status   SARS Coronavirus 2 by RT PCR NEGATIVE NEGATIVE Final    Comment: (NOTE) SARS-CoV-2 target nucleic acids are NOT DETECTED.  The SARS-CoV-2 RNA is generally detectable in upper respiratory specimens during the acute phase of infection. The lowest concentration of SARS-CoV-2 viral copies this assay can detect is 138 copies/mL. A negative result does not preclude SARS-Cov-2 infection and should not be used as the sole basis for treatment or other patient management decisions. A negative result may occur with  improper specimen collection/handling, submission of specimen other than nasopharyngeal swab, presence of viral mutation(s) within the areas targeted by this assay, and inadequate number of viral copies(<138 copies/mL). A negative result must be combined with clinical observations, patient history, and epidemiological information. The expected result is Negative.  Fact Sheet for Patients:  EntrepreneurPulse.com.au  Fact Sheet for Healthcare  Providers:  IncredibleEmployment.be  This test is no t yet approved or cleared by the [place] FDA and  has been authorized for detection and/or diagnosis of SARS-CoV-2 by FDA under an Emergency Use Authorization (EUA). This EUA will remain  in effect (meaning this test can be used)  for the duration of the COVID-19 declaration under Section 564(b)(1) of the Act, 21 U.S.C.section 360bbb-3(b)(1), unless the authorization is terminated  or revoked sooner.       Influenza A by PCR NEGATIVE NEGATIVE Final   Influenza B by PCR NEGATIVE NEGATIVE Final    Comment: (NOTE) The Xpert Xpress SARS-CoV-2/FLU/RSV plus assay is intended as an aid in the diagnosis of influenza from Nasopharyngeal swab specimens and should not be used as a sole basis for treatment. Nasal washings and aspirates are unacceptable for Xpert Xpress SARS-CoV-2/FLU/RSV testing.  Fact Sheet for Patients: EntrepreneurPulse.com.au  Fact Sheet for Healthcare Providers: IncredibleEmployment.be  This test is not yet approved or cleared by the [place] FDA and has been authorized for detection and/or diagnosis of SARS-CoV-2 by FDA under an Emergency Use Authorization (EUA). This EUA will remain in effect (meaning this test can be used) for the duration of the COVID-19 declaration under Section 564(b)(1) of the Act, 21 U.S.C. section 360bbb-3(b)(1), unless the authorization is terminated or revoked.  Performed at [facility], [place] [address], [place], [place] [address]   Culture, blood (Routine X 2) w Reflex to ID Panel     Status: None (Preliminary result)   Collection Time: 04/05/22  2:48 PM   Specimen: BLOOD RIGHT ARM  Result Value Ref Range Status   Specimen Description BLOOD RIGHT ARM  Final   Special Requests   Final    BOTTLES DRAWN AEROBIC AND ANAEROBIC Blood Culture adequate volume   Culture  Setup Time   Final    GRAM NEGATIVE  RODS ANAEROBIC BOTTLE ONLY CRITICAL RESULT CALLED TO, READ BACK BY AND VERIFIED WITH: PHARMD JUSTIN LEGGE [id number] FCP CRITICAL VALUE NOTED.  VALUE IS CONSISTENT WITH PREVIOUSLY REPORTED AND CALLED VALUE. Performed at [facility], [place] [address] [place], [place] [address]    Culture GRAM NEGATIVE RODS  Final   Report Status PENDING  Incomplete  Culture, blood (Routine X 2) w Reflex to ID Panel     Status: None (Preliminary result)   Collection Time: 04/05/22  2:53 PM   Specimen: BLOOD LEFT ARM  Result Value Ref Range Status   Specimen Description BLOOD LEFT ARM  Final   Special Requests   Final    BOTTLES DRAWN AEROBIC ONLY Blood Culture results may not be optimal due to an inadequate volume of blood received in culture bottles   Culture   Final    NO GROWTH 4 DAYS Performed at [facility], [place] [address], [place], [place] [address]    Report Status PENDING  Incomplete         Radiology Studies: No results found.      Scheduled Meds:  carbidopa-levodopa  1 tablet Oral QHS   carbidopa-levodopa  2 tablet Oral 2 times per day   And   carbidopa-levodopa  1 tablet Oral Daily   Chlorhexidine Gluconate Cloth  6 each Topical Daily   fludrocortisone  0.1 mg Oral Daily   glycopyrrolate  1 mg Oral BID   heparin  5,000 Units Subcutaneous Q8H   sertraline  25 mg Oral Daily   Continuous Infusions:  ampicillin (OMNIPEN) IV 2 g (04/09/22 0543)   meropenem (MERREM) IV 1 g (04/09/22 0944)     LOS: 5 days    Time spent: 35 minutes    Barb Merino, MD [facility] Pager [phone]

## 2022-04-09 NOTE — Progress Notes (Signed)
Physical Therapy Treatment Patient Details Name: Theodore King MRN: [id number] DOB: 1945/07/17 Today's Date: 04/09/2022   History of Present Illness 76 year old gentleman with history of Parkinson's and dementia, hyperlipidemia, thrombocytopenia, covid/pna, BPPV,  BPH and chronic urinary retention with indwelling Foley catheter. recurrent hospitalization due to UTI brought to the emergency room with lethargy, Foley catheter with foul-smelling urine. admitted with UTI, sepsis, AKI and malfunctioning Foley catheter.    PT Comments    General Comments: Hx Dementia but pt was able to follow simple functional commands and able to express self. Pt in bed just finished eating lunch. Assisted OOB to amb was difficult and required + 2 assist.  General bed mobility comments: required Max Asisst for upper body as well as increased time to scoot to EOB using bed pad to complete.  Poor initiation and Max posterior lean. General transfer comment: required + 2 side by side MAX Assist to rise from elevated with severe posterior lean and rigidity.  Also asissted on/off BSC with difficulty self performing stand to sit due to rigidity.  75% VC's to reach back and flex hips/knees.  On BSC > 5 min to attempt BM however unsuccessful.  Required same + 2 asisst to rise.  Again, severe posterior lean.  General Gait Details: Required Max Assist + 2 side by side to amb a limited distance of 28 feet while "pushing" pt forward as well as Therapist advancing walker.  Typical Parkinson's Gait of short, shuffled, rigid steps.  Severe posterior lean.  Third assist following with recliner.  HIGH FALL RISK. Pt admitted from home and currently participated in PACE program.   Recommendations for follow up therapy are one component of a multi-disciplinary discharge planning process, led by the attending physician.  Recommendations may be updated based on patient status, additional functional criteria and insurance  authorization.  Follow Up Recommendations  Home health PT (will need 24/7 care and PTAR transport) Can patient physically be transported by private vehicle: No   Assistance Recommended at Discharge Frequent or constant Supervision/Assistance  Patient can return home with the following A lot of help with walking and/or transfers;A lot of help with bathing/dressing/bathroom;Assist for transportation;Help with stairs or ramp for entrance   Equipment Recommendations  None recommended by PT    Recommendations for Other Services       Precautions / Restrictions Precautions Precaution Comments: Parkinson's Restrictions Weight Bearing Restrictions: No     Mobility  Bed Mobility Overal bed mobility: Needs Assistance Bed Mobility: Supine to Sit     Supine to sit: Max assist     General bed mobility comments: required Max Asisst for upper body as well as increased time to scoot to EOB using bed pad to complete.  Poor initiation and Max posterior lean.    Transfers Overall transfer level: Needs assistance Equipment used: Rolling walker (2 wheels) Transfers: Sit to/from Stand Sit to Stand: +2 physical assistance, +2 safety/equipment, Max assist           General transfer comment: required + 2 side by side MAX Assist to rise from elevated with severe posterior lean and rigidity.  Also asissted on/off BSC with difficulty self performing stand to sit due to rigidity.  75% VC's to reach back and flex hips/knees.  On BSC > 5 min to attempt BM however unsuccessful.  Required same + 2 asisst to rise.  Again, severe posterior lean.    Ambulation/Gait Ambulation/Gait assistance: Max assist, +2 physical assistance, +2 safety/equipment Gait Distance (Feet): 28 Feet  Gait Pattern/deviations: Step-to pattern, Decreased step length - right, Decreased step length - left, Shuffle, Ataxic, Festinating, Narrow base of support, WFL(Within Functional Limits) Gait velocity: decreased      General Gait Details: Required Max Assist + 2 side by side to amb a limited distance of 28 feet while "pushing" pt forward as well as Therapist advancing walker.  Typical Parkinson's Gait of short, shuffled, rigid steps.  Severe posterior lean.  Third assist following with recliner.  HIGH FALL RISK.   Stairs             Wheelchair Mobility    Modified Rankin (Stroke Patients Only)       Balance                                            Cognition Arousal/Alertness: Awake/alert Behavior During Therapy: WFL for tasks assessed/performed Overall Cognitive Status: History of cognitive impairments - at baseline                                 General Comments: Hx Dementia but pt was able to follow simple functional commands and able to express self.        Exercises      General Comments        Pertinent Vitals/Pain Pain Assessment Pain Assessment: Faces Faces Pain Scale: Hurts a little bit Pain Location: L ankle present with mild edema Pain Descriptors / Indicators: Tender, Grimacing Pain Intervention(s): Monitored during session, Repositioned    Home Living                          Prior Function            PT Goals (current goals can now be found in the care plan section) Progress towards PT goals: Progressing toward goals    Frequency           PT Plan Current plan remains appropriate    Co-evaluation              AM-PAC PT "6 Clicks" Mobility   Outcome Measure  Help needed turning from your back to your side while in a flat bed without using bedrails?: A Lot Help needed moving from lying on your back to sitting on the side of a flat bed without using bedrails?: A Lot Help needed moving to and from a bed to a chair (including a wheelchair)?: A Lot Help needed standing up from a chair using your arms (e.g., wheelchair or bedside chair)?: A Lot Help needed to walk in hospital room?: A Lot Help  needed climbing 3-5 steps with a railing? : Total 6 Click Score: 11    End of Session Equipment Utilized During Treatment: Gait belt Activity Tolerance: Patient tolerated treatment well Patient left: in bed;with call bell/phone within reach;with bed alarm set Nurse Communication: Mobility status PT Visit Diagnosis: Other abnormalities of gait and mobility (R26.89);Difficulty in walking, not elsewhere classified (R26.2)     Time: 9470-9628 PT Time Calculation (min) (ACUTE ONLY): 32 min  Charges:  $Gait Training: 8-22 mins $Therapeutic Activity: 8-22 mins                     {Elvis Laufer  PTA Acute  Rehabilitation [employer] M-F  [phone] Weekend pager [phone]

## 2022-04-09 NOTE — Progress Notes (Addendum)
    [name]    Date of Admission:  04/04/2022   Total days of antibiotics   5 (amp/cefepime--> merrem/amp)              ID: Theodore King is a 76 y.o. male with   Principal Problem:   Sepsis ([place]) Active Problems:   Bacteremia   Chronic indwelling Foley catheter    Subjective: Resting queitly.   Medications:   carbidopa-levodopa  1 tablet Oral QHS   carbidopa-levodopa  2 tablet Oral 2 times per day   And   carbidopa-levodopa  1 tablet Oral Daily   Chlorhexidine Gluconate Cloth  6 each Topical Daily   fludrocortisone  0.1 mg Oral Daily   glycopyrrolate  1 mg Oral BID   heparin  5,000 Units Subcutaneous Q8H   sertraline  25 mg Oral Daily    Objective: Vital signs in last 24 hours: Temp:  [97.4 F (36.3 C)-98.6 F (37 C)] 98.6 F (37 C) (11/03 0605) Pulse Rate:  [60-65] 60 (11/03 0605) Resp:  [14-19] 19 (11/03 0605) BP: (106-159)/(57-64) 159/62 (11/03 0605) SpO2:  [100 %] 100 % (11/03 0605)   General appearance: no distress Resp: clear to auscultation bilaterally Cardio: regular rate and rhythm GI: normal findings: bowel sounds normal and soft, non-tender  Lab Results Recent Labs    04/07/22 0516 04/08/22 0906 04/09/22 0912  WBC 6.3  --   --   HGB 8.0*  --   --   HCT 25.8*  --   --   NA 144 145 143  K 4.2 4.0 3.9  CL 117* 115* 113*  CO2 '23 23 24  '$ BUN 48* 34* 29*  CREATININE 2.34* 1.96* 1.81*   Liver Panel No results for input(s): "PROT", "ALBUMIN", "AST", "ALT", "ALKPHOS", "BILITOT", "BILIDIR", "IBILI" in the last 72 hours. Sedimentation Rate No results for input(s): "ESRSEDRATE" in the last 72 hours. C-Reactive Protein No results for input(s): "CRP" in the last 72 hours.  Microbiology:  Studies/Results: No results found.   Assessment/Plan: UTI Sepsis Polymicrobial bacteremia (E faecalis, Enterobacter cloacae) Dementia Chronic Foley AKI   His repeat BCx is 1/3+ for enterobacter Sensi testing for  carbapenem on his repeat BCx is sensitive. His Cr continues to improve.  Would give him 1 week of levaquin/amp at d/c or after 7 days of IV whichever comes first.  Available as needed.   [facility] Pager: [phone]  04/09/2022, 12:39 PM

## 2022-04-10 DIAGNOSIS — R7881 Bacteremia: Secondary | ICD-10-CM | POA: Diagnosis not present

## 2022-04-10 LAB — CULTURE, BLOOD (ROUTINE X 2)
Culture: NO GROWTH
Special Requests: ADEQUATE
Special Requests: ADEQUATE

## 2022-04-10 LAB — BASIC METABOLIC PANEL
Anion gap: 6 (ref 5–15)
BUN: 26 mg/dL — ABNORMAL HIGH (ref 8–23)
CO2: 26 mmol/L (ref 22–32)
Calcium: 8.1 mg/dL — ABNORMAL LOW (ref 8.9–10.3)
Chloride: 110 mmol/L (ref 98–111)
Creatinine, Ser: 1.82 mg/dL — ABNORMAL HIGH (ref 0.61–1.24)
GFR, Estimated: 38 mL/min — ABNORMAL LOW (ref >60)
Glucose, Bld: 80 mg/dL (ref 70–99)
Potassium: 3.9 mmol/L (ref 3.5–5.1)
Sodium: 142 mmol/L (ref 135–145)

## 2022-04-10 LAB — CARBAPENEM RESISTANCE PANEL
Carba Resistance IMP Gene: NOT DETECTED
Carba Resistance KPC Gene: NOT DETECTED
Carba Resistance NDM Gene: NOT DETECTED
Carba Resistance OXA48 Gene: NOT DETECTED
Carba Resistance VIM Gene: NOT DETECTED

## 2022-04-10 LAB — URINE CULTURE: Culture: >=100000 — AB

## 2022-04-10 NOTE — Progress Notes (Signed)
Pt has a chronic foley catheter in place. Pts penis is noted to be swollen and has some discharge and leakage. Pt has complaints of discomfort. RN elevated the pts penis to alleviate swelling. MD notified.

## 2022-04-10 NOTE — Progress Notes (Signed)
PROGRESS NOTE  Theodore King  DOB: Dec 26, 1945  PCP: [name], MD EXH:[id number]  DOA: 04/04/2022  LOS: 6 days  Hospital Day: 7  Brief narrative: Theodore King is a 76 y.o. 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 10/21, patient was brought to the ED from home with lethargy, foul-smelling urine leaking from around the meatus. Work-up showed WC count elevated 21.3 lactic acid 2.4, creatinine elevated to 4.76 against a baseline of 1.35. Foley was found to be occluded with retention of 800 mill of urine.  Catheter exchanged.  Admitted to [facility] with acute UTI, sepsis, AKI Blood culture sent on admission grew E faecalis and Enterobacter cloacae. Repeat blood culture positive as well. See details below.  Subjective: Patient was seen and examined this morning.  Elderly African-American male.  Lying in bed.  Not in distress. In the last 24 hours, afebrile, heart rate close to 60s, blood pressure 160s and 170s, breathing room air Last set of labs from this morning with creatinine at 1.82 largely stable, most recent CBC from 11/1 with hemoglobin at 8 Later this morning, RN noted that patient's penis was swollen and had some discharge and leakage.  Foley catheter continues to drain.  Assessment and plan: Sepsis POA Acute UTI 2/2 indwelling Foley catheter Polymicrobial bacteremia (E faecalis and Enterobacter cloacae) Blood cultures 10/29, Enterococcus faecalis and Enterococcus cloacea Blood cultures 10/30, growing Enterobacter again. ID following.  Defer to ID for antibiotics. Given clinical presentation, likely has prostatitis and will need prolonged antibiotic therapy.  not recommending TEE. No recurrence of fever.  WBC count improved.  Repeat lactic acid level tomorrow. Recent Labs  Lab 04/04/22 1331 04/04/22 1345  04/04/22 1537 04/05/22 0511 04/06/22 0529 04/07/22 0516  WBC 21.3*  --   --  18.4* 9.5 6.3  LATICACIDVEN  --  2.4* 2.2*  --   --   --    Acute on chronic urinary retention BPH Due to clogged Foley catheter. Foley catheter exchanged in ED, 1800 mill urine drained. Foley catheter functioning well. this morning, RN noted that patient's penis was swollen and had some discharge and leakage.  On my evaluation later in the afternoon, penile swelling seems to be doing.  Patient is not in discomfort.  Foley catheter draining well.  AKI on CKD 3B Baseline creatinine 1.3-1.5.  Patient with creatinine significantly elevated to 4.76.  Secondary to sepsis, urinary retention.  Gradually improving creatinine.  Creatinine close to 1.8 for last 2 days.  Probably new baseline.  Currently not on IV hydration.  Encourage oral hydration. Recent Labs    02/02/22 0417 02/03/22 0412 03/12/22 1904 04/04/22 1331 04/05/22 0511 04/06/22 0529 04/07/22 0516 04/08/22 0906 04/09/22 0912 04/10/22 0646  BUN 30* 24* 27* 64* 64* 58* 48* 34* 29* 26*  CREATININE 1.44* 1.28* 1.35* 4.76* 3.62* 2.76* 2.34* 1.96* 1.81* 1.82*   Chronic anemia Hemoglobin at baseline close to 8.  Remained stable at this time.  Repeat labs tomorrow. Recent Labs    02/02/22 0417 02/03/22 0412 03/12/22 1904 04/04/22 1331 04/05/22 0511 04/06/22 0529 04/07/22 0516  HGB 7.7*   < > 10.9* 8.5* 7.9* 7.7* 8.0*  MCV 86.0   < > 84.8 79.8* 78.7* 79.6* 81.6  VITAMINB12 423  --   --   --   --  680  --   FOLATE  --   --   --   --   --  6.4  --   FERRITIN  --   --   --   --   --  166  --   TIBC  --   --   --   --   --  150*  --   IRON  --   --   --   --   --  48  --   RETICCTPCT  --   --   --   --   --  <0.4*  --    < > = values in this interval not displayed.   Advanced Parkinson's with dementia On Sinemet.  Continue fall precautions.  PT eval obtained.  Home health PT recommended.  Patient reports he has adequate support system at home.   Continue Zoloft 25 g daily, trazodone 50 mg at bedtime  Hypertension Blood pressure running elevated to 160s 170s.  It seems patient was on Florinef 0.1 mg daily.  I suspect he might have had orthostatic hypotension in the past..  Continue Florinef  Goals of care   Code Status: Full Code    Mobility: PT eval obtained.  Home health PT recommended  Skin assessment:     Nutritional status:  Body mass index is 22.45 kg/m.          Diet:  Diet Order             Diet regular Room service appropriate? Yes; Fluid consistency: Thin  Diet effective now                   DVT prophylaxis:  heparin injection 5,000 Units Start: 04/04/22 2200   Antimicrobials: Currently on oral amoxicillin and oral Levaquin Fluid: None Consultants: ID Family Communication: None at bedside  Status is: Inpatient  Continue in-hospital care because: Foley catheter leakage, repeat labs tomorrow Level of care: Med-Surg   Dispo: The patient is from: Home              Anticipated d/c is to: Home health PT in 1 to 2 days              Patient currently is not medically stable to d/c.   Difficult to place patient No     Infusions:    Scheduled Meds:  amoxicillin  1,000 mg Oral Q8H   carbidopa-levodopa  1 tablet Oral QHS   carbidopa-levodopa  2 tablet Oral 2 times per day   And   carbidopa-levodopa  1 tablet Oral Daily   Chlorhexidine Gluconate Cloth  6 each Topical Daily   fludrocortisone  0.1 mg Oral Daily   glycopyrrolate  1 mg Oral BID   heparin  5,000 Units Subcutaneous Q8H   levofloxacin  500 mg Oral Daily   sertraline  25 mg Oral Daily    PRN meds: acetaminophen, bisacodyl, ondansetron **OR** ondansetron (ZOFRAN) IV, polyethylene glycol   Antimicrobials: Anti-infectives (From admission, onward)    Start     Dose/Rate Route Frequency Ordered Stop   04/09/22 1615  amoxicillin (AMOXIL) capsule 1,000 mg        1,000 mg Oral Every 8 hours 04/09/22 1516 04/19/22 0559    04/09/22 1615  levofloxacin (LEVAQUIN) tablet 500 mg        500 mg Oral Daily 04/09/22 1516 04/16/22 0959   04/08/22 1200  ampicillin (OMNIPEN) 2 g in sodium chloride 0.9 % 100 mL IVPB  Status:  Discontinued        2 g 300 mL/hr over 20 Minutes Intravenous Every  6 hours 04/08/22 1025 04/09/22 1516   04/07/22 1400  meropenem (MERREM) 1 g in sodium chloride 0.9 % 100 mL IVPB  Status:  Discontinued        1 g 200 mL/hr over 30 Minutes Intravenous Every 12 hours 04/07/22 1258 04/09/22 1516   04/05/22 1400  ceFEPIme (MAXIPIME) 2 g in sodium chloride 0.9 % 100 mL IVPB  Status:  Discontinued        2 g 200 mL/hr over 30 Minutes Intravenous Every 24 hours 04/04/22 1713 04/07/22 1258   04/05/22 1300  ampicillin (OMNIPEN) 2 g in sodium chloride 0.9 % 100 mL IVPB  Status:  Discontinued        2 g 300 mL/hr over 20 Minutes Intravenous Every 8 hours 04/05/22 1122 04/08/22 1025   04/04/22 1645  cefTRIAXone (ROCEPHIN) 2 g in sodium chloride 0.9 % 100 mL IVPB  Status:  Discontinued        2 g 200 mL/hr over 30 Minutes Intravenous Every 24 hours 04/04/22 1640 04/04/22 1711   04/04/22 1345  ceFEPIme (MAXIPIME) 2 g in sodium chloride 0.9 % 100 mL IVPB        2 g 200 mL/hr over 30 Minutes Intravenous  Once 04/04/22 1338 04/04/22 1454       Objective: Vitals:   04/10/22 0450 04/10/22 1325  BP: (!) 171/78 (!) 182/63  Pulse: (!) 58 60  Resp: 16 16  Temp: (!) 97.4 F (36.3 C) (!) 97.5 F (36.4 C)  SpO2: 100% 100%    Intake/Output Summary (Last 24 hours) at 04/10/2022 1518 Last data filed at 04/10/2022 1221 Gross per 24 hour  Intake 930 ml  Output 2500 ml  Net -1570 ml   Filed Weights   04/04/22 1500  Weight: 77.2 kg   Weight change:  Body mass index is 22.45 kg/m.   Physical Exam: General exam: Pleasant elderly African-American male.  Not in physical distress Skin: No rashes, lesions or ulcers. HEENT: Atraumatic, normocephalic, no obvious bleeding Lungs: Clear to auscultation  bilaterally CVS: Regular rate and rhythm, no murmur GI/Abd soft, nontender, nondistended, bowel sound present CNS: Alert, slow to respond, oriented to place and person Psychiatry: Sad affect Extremities: Trace bilateral pedal edema.  No calf tenderness  Data Review: I have personally reviewed the laboratory data and studies available.  F/u labs ordered Unresulted Labs (From admission, onward)     Start     Ordered   04/11/22 9379  Basic metabolic panel  Tomorrow morning,   R        04/10/22 1001   04/11/22 0500  CBC with Differential/Platelet  Tomorrow morning,   R        04/10/22 1001   04/11/22 0500  Lactic acid, plasma  Tomorrow morning,   R        04/10/22 1001            Signed, Terrilee Croak, MD [facility] 04/10/2022

## 2022-04-11 DIAGNOSIS — R7881 Bacteremia: Secondary | ICD-10-CM | POA: Diagnosis not present

## 2022-04-11 LAB — BASIC METABOLIC PANEL
Anion gap: 5 (ref 5–15)
BUN: 23 mg/dL (ref 8–23)
CO2: 26 mmol/L (ref 22–32)
Calcium: 7.9 mg/dL — ABNORMAL LOW (ref 8.9–10.3)
Chloride: 108 mmol/L (ref 98–111)
Creatinine, Ser: 1.44 mg/dL — ABNORMAL HIGH (ref 0.61–1.24)
GFR, Estimated: 50 mL/min — ABNORMAL LOW (ref >60)
Glucose, Bld: 90 mg/dL (ref 70–99)
Potassium: 3.7 mmol/L (ref 3.5–5.1)
Sodium: 139 mmol/L (ref 135–145)

## 2022-04-11 LAB — CBC WITH DIFFERENTIAL/PLATELET
Abs Immature Granulocytes: 0.07 10*3/uL (ref 0.00–0.07)
Basophils Absolute: 0 10*3/uL (ref 0.0–0.1)
Basophils Relative: 1 %
Eosinophils Absolute: 0.1 10*3/uL (ref 0.0–0.5)
Eosinophils Relative: 1 %
HCT: 26.7 % — ABNORMAL LOW (ref 39.0–52.0)
Hemoglobin: 8.4 g/dL — ABNORMAL LOW (ref 13.0–17.0)
Immature Granulocytes: 1 %
Lymphocytes Relative: 22 %
Lymphs Abs: 1.2 10*3/uL (ref 0.7–4.0)
MCH: 25.1 pg — ABNORMAL LOW (ref 26.0–34.0)
MCHC: 31.5 g/dL (ref 30.0–36.0)
MCV: 79.9 fL — ABNORMAL LOW (ref 80.0–100.0)
Monocytes Absolute: 0.5 10*3/uL (ref 0.1–1.0)
Monocytes Relative: 9 %
Neutro Abs: 3.5 10*3/uL (ref 1.7–7.7)
Neutrophils Relative %: 66 %
Platelets: 196 10*3/uL (ref 150–400)
RBC: 3.34 MIL/uL — ABNORMAL LOW (ref 4.22–5.81)
RDW: 14.3 % (ref 11.5–15.5)
WBC: 5.3 10*3/uL (ref 4.0–10.5)
nRBC: 0 % (ref 0.0–0.2)

## 2022-04-11 LAB — LACTIC ACID, PLASMA: Lactic Acid, Venous: 1.2 mmol/L (ref 0.5–1.9)

## 2022-04-11 MED ORDER — SACCHAROMYCES BOULARDII 250 MG PO CAPS: 250.0000 mg | ORAL_CAPSULE | Freq: Two times a day (BID) | ORAL | 0 refills | Status: AC

## 2022-04-11 MED ORDER — LEVOFLOXACIN 500 MG PO TABS: 500.0000 mg | ORAL_TABLET | Freq: Every day | ORAL | 0 refills | Status: AC

## 2022-04-11 MED ORDER — AMOXICILLIN 500 MG PO CAPS: 1000.0000 mg | ORAL_CAPSULE | Freq: Three times a day (TID) | ORAL | 0 refills | Status: AC

## 2022-04-11 NOTE — Progress Notes (Signed)
Pt being discharged to home via [facility]. RN called daughter to inform of PTAR leaving the hospital. Discharge instructions and medications provided to pt and PTAR.

## 2022-04-11 NOTE — TOC Transition Note (Signed)
Transition of Care [facility]) - CM/SW Discharge Note   Patient Details  Name: Theodore King MRN: [id number] Date of Birth: [birth year]  [facility] CM/SW Contact:  Rodney Booze, LCSW Phone Number: 04/11/2022, 11:25 AM   Clinical Narrative:     Pt will go home with Home Health, previous weekday CSW already reached out to family as well HH.   Final next level of care: [place] Barriers to Discharge: No Barriers Identified   Patient Goals and CMS Choice Patient states their goals for this hospitalization and ongoing recovery are:: For pt to return home per daughter CMS Medicare.gov Compare Post Acute Care list provided to:: Legal Guardian (Daughter) Choice offered to / list presented to : [facility] POA / [place], Adult Children  Discharge Placement                       Discharge Plan and Services In-house Referral: NA Discharge Planning Services: CM Consult Post Acute Care Choice: Home Health, Resumption of Svcs/PTA Provider          DME Arranged: N/A DME Agency: NA         HH Agency: Other - See comment ([facility])        Social Determinants of Health (SDOH) Interventions     Readmission Risk Interventions    04/08/2022    9:56 AM 04/07/2022    8:15 AM  Readmission Risk Prevention Plan  Transportation Screening Complete Complete  Medication Review ([place]) Complete Complete  PCP or Specialist appointment within 3-5 days of discharge Complete Complete  HRI or Home Care Consult Complete Complete  SW Recovery Care/Counseling Consult Complete Complete  [place] Not Applicable Not Applicable

## 2022-04-11 NOTE — Plan of Care (Signed)

## 2022-04-11 NOTE — Progress Notes (Signed)
CSW spoke to the patients daughter. The Patient will leave with PTAR and has the supports he needs at home. CSW also gave Whitney from [facility] place for mom the patients information to maybe set up some outside resources. TOC has added DC note and PTAR was called.

## 2022-04-11 NOTE — Progress Notes (Signed)
Occupational Therapy Treatment Patient Details Name: Theodore King MRN: [id number] DOB: 1946-04-11 Today's Date: 04/11/2022   History of present illness 76 year old gentleman with history of Parkinson's and dementia, hyperlipidemia, thrombocytopenia, covid/pna, BPPV,  BPH and chronic urinary retention with indwelling Foley catheter. recurrent hospitalization due to UTI brought to the emergency room with lethargy, Foley catheter with foul-smelling urine. admitted with UTI, sepsis, AKI and malfunctioning Foley catheter.   OT comments  Treatment focused on improving balance and advancing functional mobility in order for safe discharge home. Patient more alert and talkative this morning. He reported wanting to get up and walk in the hallway. With min assist to stabilize his balance and a chair follow for safety patient able to ambulate 250 feet. He also needed verbal cues and assist with walker management but improved with distance. Patient able to feed himself and wash his hands with setup. Cont POC.   Recommendations for follow up therapy are one component of a multi-disciplinary discharge planning process, led by the attending physician.  Recommendations may be updated based on patient status, additional functional criteria and insurance authorization.    Follow Up Recommendations  Other (comment) ([facility]ACE)    Assistance Recommended at Discharge Frequent or constant Supervision/Assistance  Patient can return home with the following  A little help with walking and/or transfers;A lot of help with bathing/dressing/bathroom;Assistance with cooking/housework;Direct supervision/assist for medications management;Direct supervision/assist for financial management;Help with stairs or ramp for entrance   Equipment Recommendations  None recommended by OT    Recommendations for Other Services      Precautions / Restrictions Precautions Precautions: Fall Precaution Comments:  Parkinson's Restrictions Weight Bearing Restrictions: No       Mobility Bed Mobility                    Transfers                         Balance Overall balance assessment: Needs assistance Sitting-balance support: No upper extremity supported, Feet supported Sitting balance-Leahy Scale: Fair     Standing balance support: During functional activity, Bilateral upper extremity supported Standing balance-Leahy Scale: Poor                             ADL either performed or assessed with clinical judgement   ADL Overall ADL's : Needs assistance/impaired Eating/Feeding: Set up;Sitting Eating/Feeding Details (indicate cue type and reason): Patient found finishing feeding himself breakast. Grooming: Wash/dry hands;Set up Grooming Details (indicate cue type and reason): Patietn washed his hands in preparation for activity                             Functional mobility during ADLs: Minimal assistance;+2 for safety/equipment General ADL Comments: Patient initially exhibited a postior bias with standing and beginning of ambulation. IMproved from min assist to min guard. Ambualted 250 feet with chair follow for safety. Needed assistance for walker management and verbal cues for larger steps and pace of gait.    Extremity/Trunk Assessment Upper Extremity Assessment Upper Extremity Assessment: Overall WFL for tasks assessed   Lower Extremity Assessment Lower Extremity Assessment: Defer to PT evaluation   Cervical / Trunk Assessment Cervical / Trunk Assessment: Kyphotic    Vision   Additional Comments: Reported double vision initially when therapist entered room but then said it was gone/intermittent   Perception  Praxis      Cognition Arousal/Alertness: Awake/alert Behavior During Therapy: WFL for tasks assessed/performed Overall Cognitive Status: History of cognitive impairments - at baseline                                  General Comments: Hx Dementia but pt was able to follow simple functional commands and able to express self.        Exercises      Shoulder Instructions       General Comments      Pertinent Vitals/ Pain       Pain Assessment Pain Assessment: Faces Faces Pain Scale: Hurts a little bit Pain Location: penis Pain Descriptors / Indicators: Tender, Grimacing, Sore Pain Intervention(s): Monitored during session  Home Living                                          Prior Functioning/Environment              Frequency  Min 2X/week        Progress Toward Goals  OT Goals(current goals can now be found in the care plan section)  Progress towards OT goals: Progressing toward goals  Acute Rehab OT Goals OT Goal Formulation: Patient unable to participate in goal setting Time For Goal Achievement: 04/22/22 Potential to Achieve Goals: [place] Discharge plan remains appropriate    Co-evaluation                 AM-PAC OT "6 Clicks" Daily Activity     Outcome Measure   Help from another person eating meals?: A Little Help from another person taking care of personal grooming?: A Little Help from another person toileting, which includes using toliet, bedpan, or urinal?: A Lot Help from another person bathing (including washing, rinsing, drying)?: A Lot Help from another person to put on and taking off regular upper body clothing?: A Lot Help from another person to put on and taking off regular lower body clothing?: A Lot 6 Click Score: 14    End of Session Equipment Utilized During Treatment: Rolling walker (2 wheels);Gait belt  OT Visit Diagnosis: Unsteadiness on feet (R26.81)   Activity Tolerance Patient tolerated treatment well   Patient Left in chair;with call bell/phone within reach;with chair alarm set   Nurse Communication Mobility status        Time: 8938-1017 OT Time Calculation (min): 20 min  Charges: OT  General Charges $OT Visit: 1 Visit OT Treatments $Therapeutic Activity: 8-22 mins  Gustavo Lah, OTR/L Acute Care Rehab Services  Office [phone]   Lenward Chancellor 04/11/2022, 12:58 PM

## 2022-04-11 NOTE — Discharge Summary (Signed)
Physician Discharge Summary  Theodore King IOE:[id number] DOB: [birth year] DOA: 04/04/2022  PCP: [name], MD  Admit date: 04/04/2022 Discharge date: 04/11/2022  Admitted From: Home Discharge disposition: Home with [facility] PT  Recommendations at discharge:  Antibiotics for 7 more days   Brief narrative: Theodore King is a 76 y.o. 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 10/21, patient was brought to the ED from home with lethargy, foul-smelling urine leaking from around the meatus. Work-up showed WC count elevated 21.3 lactic acid 2.4, creatinine elevated to 4.76 against a baseline of 1.35. Foley was found to be occluded with retention of 800 mill of urine.  Catheter exchanged.  Admitted to [facility] with acute UTI, sepsis, AKI Blood culture sent on admission grew E faecalis and Enterobacter cloacae. Repeat blood culture positive as well. See details below.  Subjective: Patient was seen and examined this morning.  Elderly African-American male.  Lying in bed.  Not in distress. Sitting up in recliner.  Not in distress.  No new symptoms.  No problem with his urinary catheter.  Hospital course: Sepsis POA Acute UTI 2/2 indwelling Foley catheter Polymicrobial bacteremia (E faecalis and Enterobacter cloacae) Blood cultures 10/29, Enterococcus faecalis and Enterococcus cloacea Blood cultures 10/30, growing Enterobacter again. ID consult appreciated.  Currently on antibiotics per ID recommendation.  No recurrence of fever.  WBC count, lactic acid level improved. Per ID recommendation, will discharge the patient on oral Levaquin and ampicillin for 7 days with probiotics. Recent Labs  Lab 04/04/22 1331 04/04/22 1345 04/04/22 1537 04/05/22 0511 04/06/22 0529 04/07/22 0516 04/11/22 0509  WBC 21.3*  --   --  18.4* 9.5 6.3 5.3   LATICACIDVEN  --  2.4* 2.2*  --   --   --  1.2   Acute on chronic urinary retention BPH Due to clogged Foley catheter. Foley catheter exchanged in ED, 800 mill urine drained. Foley catheter is currently functioning well.  Discharge with Foley  AKI on CKD 3B Baseline creatinine 1.3-1.5.  Patient with creatinine significantly elevated to 4.76.  Secondary to sepsis, urinary retention.  Gradually improving creatinine.  Back down to baseline range today. Recent Labs    02/03/22 0412 03/12/22 1904 04/04/22 1331 04/05/22 0511 04/06/22 0529 04/07/22 0516 04/08/22 0906 04/09/22 0912 04/10/22 0646 04/11/22 0509  BUN 24* 27* 64* 64* 58* 48* 34* 29* 26* 23  CREATININE 1.28* 1.35* 4.76* 3.62* 2.76* 2.34* 1.96* 1.81* 1.82* 1.44*   Chronic anemia Hemoglobin at baseline close to 8.  Remains stable. Recent Labs    02/02/22 0417 02/03/22 0412 04/04/22 1331 04/05/22 0511 04/06/22 0529 04/07/22 0516 04/11/22 0509  HGB 7.7*   < > 8.5* 7.9* 7.7* 8.0* 8.4*  MCV 86.0   < > 79.8* 78.7* 79.6* 81.6 79.9*  VITAMINB12 423  --   --   --  680  --   --   FOLATE  --   --   --   --  6.4  --   --   FERRITIN  --   --   --   --  166  --   --   TIBC  --   --   --   --  150*  --   --   IRON  --   --   --   --  48  --   --   RETICCTPCT  --   --   --   --  <  0.4*  --   --    < > = values in this interval not displayed.   Advanced Parkinson's with dementia On Sinemet.  Continue fall precautions.  PT eval obtained.  Home health PT recommended.  Patient reports he has adequate support system at home.  Continue Zoloft 25 g daily, trazodone 50 mg at bedtime  Hypertension Blood pressure running elevated to 160s 170s.  It seems patient was on Florinef 0.1 mg daily.  I suspect he might have had orthostatic hypotension in the past..  Continue Florinef  Wounds:  - Wound / Incision (Open or Dehisced) 01/31/22 Skin tear Thigh Distal;Left;Posterior Skin Tear: Thigh (Active)  Date First Assessed/Time First  Assessed: 01/31/22 2325   Wound Type: Skin tear  Location: Thigh  Location Orientation: Distal;Left;Posterior  Wound Description (Comments): Skin Tear: Thigh  Present on Admission: Yes    Assessments 01/31/2022 11:25 PM 02/03/2022  8:01 AM  Dressing Type Foam - Lift dressing to assess site every shift Foam - Lift dressing to assess site every shift  Dressing Changed New --  Dressing Status Clean, Dry, Intact Clean, Dry, Intact  Dressing Change Frequency PRN Every 5 days  Site / Wound Assessment Red;Other (Comment) Clean;Red  Peri-wound Assessment Pink;Other (Comment) Intact  Margins Unattached edges (unapproximated) --  Closure None --  Drainage Amount None --  Treatment Cleansed;Other (Comment) --     No associated orders.     Pressure Injury 01/31/22 Buttocks Mid;Lower Stage 1 -  Intact skin with non-blanchable redness of a localized area usually over a bony prominence. (Active)  Date First Assessed/Time First Assessed: 01/31/22 2325   Location: Buttocks  Location Orientation: Mid;Lower  Staging: Stage 1 -  Intact skin with non-blanchable redness of a localized area usually over a bony prominence.  Present on Admission: Yes    Assessments 01/31/2022 11:25 PM 02/03/2022  8:01 AM  Dressing Type Foam - Lift dressing to assess site every shift Foam - Lift dressing to assess site every shift  Dressing Clean, Dry, Intact Clean, Dry, Intact  Dressing Change Frequency PRN Every 5 days  State of Healing Other (Comment) --  Site / Wound Assessment Pink;Dry;Other (Comment) Pink;Clean  Peri-wound Assessment Pink;Other (Comment) Intact  Margins Other (Comment) --  Drainage Amount None --  Treatment Cleansed;Other (Comment) --     No associated orders.    Discharge Exam:   Vitals:   04/10/22 0450 04/10/22 1325 04/10/22 1941 04/11/22 0555  BP: (!) 171/78 (!) 182/63 (!) 183/72 (!) 174/75  Pulse: (!) 58 60 61 60  Resp: '16 16 18 18  '$ Temp: (!) 97.4 F (36.3 C) (!) 97.5 F (36.4 C) 97.6 F (36.4  C) (!) 97.4 F (36.3 C)  TempSrc: Oral Oral Oral Oral  SpO2: 100% 100% 100% 100%  Weight:      Height:        Body mass index is 22.45 kg/m.  General exam: Pleasant elderly African-American male.  Not in physical distress Skin: No rashes, lesions or ulcers. HEENT: Atraumatic, normocephalic, no obvious bleeding Lungs: Clear to auscultation bilaterally CVS: Regular rate and rhythm, no murmur GI/Abd soft, nontender, nondistended, bowel sound present CNS: Alert, slow to respond, oriented to place and person Psychiatry: Mood appropriate Extremities: Trace bilateral pedal edema.  No calf tenderness  Follow ups:    Follow-up Information     [name], MD Follow up.   Specialty: Internal Medicine Contact information: [address] [place] [address] [phone]  Discharge Instructions:   Discharge Instructions     Call MD for:  difficulty breathing, headache or visual disturbances   Complete by: As directed    Call MD for:  extreme fatigue   Complete by: As directed    Call MD for:  hives   Complete by: As directed    Call MD for:  persistant dizziness or light-headedness   Complete by: As directed    Call MD for:  persistant nausea and vomiting   Complete by: As directed    Call MD for:  severe uncontrolled pain   Complete by: As directed    Call MD for:  temperature >100.4   Complete by: As directed    Diet general   Complete by: As directed    Discharge instructions   Complete by: As directed    Recommendations at discharge:   Antibiotics for 7 more days  General discharge instructions: Follow with Primary MD [name], MD in 7 days  Please request your PCP  to go over your hospital tests, procedures, radiology results at the follow up. Please get your medicines reviewed and adjusted.  Your PCP may decide to repeat certain labs or tests as needed. Do not drive, operate heavy machinery, perform activities at heights,  swimming or participation in water activities or provide baby sitting services if your were admitted for syncope or siezures until you have seen by Primary MD or a Neurologist and advised to do so again. [place] Controlled Substance Reporting System database was reviewed. Do not drive, operate heavy machinery, perform activities at heights, swim, participate in water activities or provide baby-sitting services while on medications for pain, sleep and mood until your outpatient physician has reevaluated you and advised to do so again.  You are strongly recommended to comply with the dose, frequency and duration of prescribed medications. Activity: As tolerated with Full fall precautions use walker/cane & assistance as needed Avoid using any recreational substances like cigarette, tobacco, alcohol, or non-prescribed drug. If you experience worsening of your admission symptoms, develop shortness of breath, life threatening emergency, suicidal or homicidal thoughts you must seek medical attention immediately by calling 911 or calling your MD immediately  if symptoms less severe. You must read complete instructions/literature along with all the possible adverse reactions/side effects for all the medicines you take and that have been prescribed to you. Take any new medicine only after you have completely understood and accepted all the possible adverse reactions/side effects.  Wear Seat belts while driving. You were cared for by a hospitalist during your hospital stay. If you have any questions about your discharge medications or the care you received while you were in the hospital after you are discharged, you can call the unit and ask to speak with the hospitalist or the covering physician. Once you are discharged, your primary care physician will handle any further medical issues. Please note that NO REFILLS for any discharge medications will be authorized once you are discharged, as it is imperative that  you return to your primary care physician (or establish a relationship with a primary care physician if you do not have one).   Increase activity slowly   Complete by: As directed        Discharge Medications:   Allergies as of 04/11/2022   No Known Allergies      Medication List     TAKE these medications    acetaminophen 500 MG tablet Commonly known as: TYLENOL Take 500 mg  by mouth every 6 (six) hours as needed for mild pain.   albuterol 108 (90 Base) MCG/ACT inhaler Commonly known as: VENTOLIN HFA Inhale 2 puffs into the lungs every 4 (four) hours as needed for wheezing or shortness of breath.   amoxicillin 500 MG capsule Commonly known as: AMOXIL Take 2 capsules (1,000 mg total) by mouth every 8 (eight) hours for 7 days.   carbidopa-levodopa 50-200 MG tablet Commonly known as: SINEMET CR Take 1 tablet by mouth at bedtime. What changed: Another medication with the same name was changed. Make sure you understand how and when to take each.   carbidopa-levodopa 25-100 MG tablet Commonly known as: SINEMET IR Take 2 tablets at 7 AM, 2 tablets at 10 AM, 2 tablets at 2 PM and 1 tablet at 6 PM What changed:  how much to take how to take this when to take this additional instructions   fludrocortisone 0.1 MG tablet Commonly known as: FLORINEF Take 0.1 mg by mouth daily.   glycopyrrolate 1 MG tablet Commonly known as: Robinul Take 1 tablet (1 mg total) by mouth 2 (two) times daily.   levofloxacin 500 MG tablet Commonly known as: LEVAQUIN Take 1 tablet (500 mg total) by mouth daily for 7 days. Start taking on: April 12, 2022   saccharomyces boulardii 250 MG capsule Commonly known as: FLORASTOR Take 1 capsule (250 mg total) by mouth 2 (two) times daily for 7 days.   sertraline 25 MG tablet Commonly known as: ZOLOFT Take 1 tablet (25 mg total) by mouth daily.   traZODone 50 MG tablet Commonly known as: DESYREL Take 50 mg by mouth at bedtime.          The results of significant diagnostics from this hospitalization (including imaging, microbiology, ancillary and laboratory) are listed below for reference.    Procedures and Diagnostic Studies:   DG Chest Port 1 View  Result Date: 04/04/2022 CLINICAL DATA:  Questionable sepsis. EXAM: PORTABLE CHEST 1 VIEW COMPARISON:  January 31, 2022 FINDINGS: Skin fold over the chest limit evaluation. Elevation of the left hemidiaphragm remains. Stable cardiomegaly. Stable tortuous thoracic aorta. The hila and mediastinum otherwise unchanged. No pneumothorax. No nodules or masses. No focal infiltrate identified. IMPRESSION: No active disease. Skin folds over the chest limit evaluation. Electronically Signed   By: Dorise Bullion III M.D.   On: 04/04/2022 14:13     Labs:   Basic Metabolic Panel: Recent Labs  Lab 04/07/22 0516 04/08/22 0906 04/09/22 0912 04/10/22 0646 04/11/22 0509  NA 144 145 143 142 139  K 4.2 4.0 3.9 3.9 3.7  CL 117* 115* 113* 110 108  CO2 '23 23 24 26 26  '$ GLUCOSE 105* 123* 113* 80 90  BUN 48* 34* 29* 26* 23  CREATININE 2.34* 1.96* 1.81* 1.82* 1.44*  CALCIUM 8.1* 8.1* 8.0* 8.1* 7.9*   GFR Estimated Creatinine Clearance: 46.2 mL/min (A) (by C-G formula based on SCr of 1.44 mg/dL (H)). Liver Function Tests: Recent Labs  Lab 04/05/22 0511  AST 52*  ALT 8  ALKPHOS 73  BILITOT 0.6  PROT 5.7*  ALBUMIN 2.0*   No results for input(s): "LIPASE", "AMYLASE" in the last 168 hours. No results for input(s): "AMMONIA" in the last 168 hours. Coagulation profile Recent Labs  Lab 04/04/22 1412  INR 1.3*    CBC: Recent Labs  Lab 04/04/22 1331 04/05/22 0511 04/06/22 0529 04/07/22 0516 04/11/22 0509  WBC 21.3* 18.4* 9.5 6.3 5.3  NEUTROABS 18.0*  --   --   --  3.5  HGB 8.5* 7.9* 7.7* 8.0* 8.4*  HCT 26.4* 24.4* 24.2* 25.8* 26.7*  MCV 79.8* 78.7* 79.6* 81.6 79.9*  PLT 148* 153 157 155 196   Cardiac Enzymes: No results for input(s): "CKTOTAL", "CKMB", "CKMBINDEX",  "TROPONINI" in the last 168 hours. BNP: Invalid input(s): "POCBNP" CBG: No results for input(s): "GLUCAP" in the last 168 hours. D-Dimer No results for input(s): "DDIMER" in the last 72 hours. Hgb A1c No results for input(s): "HGBA1C" in the last 72 hours. Lipid Profile No results for input(s): "CHOL", "HDL", "LDLCALC", "TRIG", "CHOLHDL", "LDLDIRECT" in the last 72 hours. Thyroid function studies No results for input(s): "TSH", "T4TOTAL", "T3FREE", "THYROIDAB" in the last 72 hours.  Invalid input(s): "FREET3" Anemia work up No results for input(s): "VITAMINB12", "FOLATE", "FERRITIN", "TIBC", "IRON", "RETICCTPCT" in the last 72 hours. Microbiology Recent Results (from the past 240 hour(s))  Carbapenem Resistance Panel     Status: None   Collection Time: 04/04/22 11:49 AM  Result Value Ref Range Status   Carba Resistance IMP Gene NOT DETECTED NOT DETECTED Final   Carba Resistance VIM Gene NOT DETECTED NOT DETECTED Final   Carba Resistance NDM Gene NOT DETECTED NOT DETECTED Final   Carba Resistance KPC Gene NOT DETECTED NOT DETECTED Final   Carba Resistance OXA48 Gene NOT DETECTED NOT DETECTED Final    Comment: (NOTE) Cepheid Carba-R is an FDA-cleared nucleic acid amplification test  (NAAT)for the detection and differentiation of genes encoding the  most prevalent carbapenemases in bacterial isolate samples. Carbapenemase gene identification and implementation of comprehensive  infection control measures are recommended by the CDC to prevent the  spread of the resistant organisms. Performed at [facility], [place] [address], [place], [place] [address]   Urine Culture     Status: Abnormal   Collection Time: 04/04/22  1:37 PM   Specimen: In/Out Cath Urine  Result Value Ref Range Status   Specimen Description   Final    IN/OUT CATH URINE Performed at [place] [address] [place], [place] [address]    Special Requests   Final    NONE Performed  at [facility], [place] [address], [place], [place] [address]    Culture (A)  Final    >=100,000 COLONIES/mL ENTEROBACTER CLOACAE 80,000 COLONIES/mL ENTEROCOCCUS FAECALIS    Report Status 04/10/2022 FINAL  Final   Organism ID, Bacteria ENTEROBACTER CLOACAE (A)  Final   Organism ID, Bacteria ENTEROCOCCUS FAECALIS (A)  Final      Susceptibility   Enterobacter cloacae - MIC*    CEFAZOLIN >=64 RESISTANT Resistant     CEFEPIME 8 INTERMEDIATE Intermediate     CIPROFLOXACIN <=0.25 SENSITIVE Sensitive     GENTAMICIN <=1 SENSITIVE Sensitive     IMIPENEM 1 SENSITIVE Sensitive     NITROFURANTOIN <=16 SENSITIVE Sensitive     TRIMETH/SULFA <=20 SENSITIVE Sensitive     PIP/TAZO >=128 RESISTANT Resistant     * >=100,000 COLONIES/mL ENTEROBACTER CLOACAE   Enterococcus faecalis - MIC*    AMPICILLIN <=2 SENSITIVE Sensitive     NITROFURANTOIN <=16 SENSITIVE Sensitive     VANCOMYCIN 2 SENSITIVE Sensitive     CIPROFLOXACIN Value in next row Sensitive      SENSITIVEMIC <=0.5    LINEZOLID Value in next row Sensitive      SENSITIVEMIC = 1    * 80,000 COLONIES/mL ENTEROCOCCUS FAECALIS  Blood Culture (routine x 2)     Status: Abnormal   Collection Time: 04/04/22  1:42 PM  Specimen: BLOOD  Result Value Ref Range Status   Specimen Description   Final    BLOOD LEFT ANTECUBITAL Performed at [place] [address], [place], [place] [address]    Special Requests   Final    BOTTLES DRAWN AEROBIC AND ANAEROBIC Blood Culture results may not be optimal due to an inadequate volume of blood received in culture bottles Performed at [place] [address], [place], [place] [address]    Culture  Setup Time   Final    GRAM NEGATIVE RODS GRAM POSITIVE COCCI IN CHAINS IN BOTH AEROBIC AND ANAEROBIC BOTTLES CRITICAL RESULT CALLED TO, READ BACK BY AND VERIFIED WITH: PHARMD D WOFFORD [id number] AT 1046 BY CM    Culture (A)  Final    ENTEROCOCCUS  FAECALIS ENTEROBACTER CLOACAE Two isolates with different morphologies were identified as the same organism.The most resistant organism was reported. Performed at [facility], [place] [address] [place], [place] [address]    Report Status 04/10/2022 FINAL  Final   Organism ID, Bacteria ENTEROCOCCUS FAECALIS  Final   Organism ID, Bacteria ENTEROBACTER CLOACAE  Final      Susceptibility   Enterobacter cloacae - MIC*    CEFAZOLIN >=64 RESISTANT Resistant     CEFEPIME 16 RESISTANT Resistant     CEFTAZIDIME >=64 RESISTANT Resistant     CIPROFLOXACIN <=0.25 SENSITIVE Sensitive     GENTAMICIN <=1 SENSITIVE Sensitive     IMIPENEM <=0.25 SENSITIVE Sensitive     TRIMETH/SULFA <=20 SENSITIVE Sensitive     PIP/TAZO >=128 RESISTANT Resistant     * ENTEROBACTER CLOACAE   Enterococcus faecalis - MIC*    AMPICILLIN <=2 SENSITIVE Sensitive     VANCOMYCIN 2 SENSITIVE Sensitive     GENTAMICIN SYNERGY SENSITIVE Sensitive     LINEZOLID Value in next row Sensitive      SENSITIVEMIC = 1    CIPROFLOXACIN Value in next row Sensitive      SENSITIVEMIC <=0.25    * ENTEROCOCCUS FAECALIS  Blood Culture ID Panel (Reflexed)     Status: Abnormal   Collection Time: 04/04/22  1:42 PM  Result Value Ref Range Status   Enterococcus faecalis DETECTED (A) NOT DETECTED Final    Comment: CRITICAL RESULT CALLED TO, READ BACK BY AND VERIFIED WITH: PHARMD D WOFFORD [id number] AT 1046 AM BY CM    Enterococcus Faecium NOT DETECTED NOT DETECTED Final   Listeria monocytogenes NOT DETECTED NOT DETECTED Final   Staphylococcus species NOT DETECTED NOT DETECTED Final   Staphylococcus aureus (BCID) NOT DETECTED NOT DETECTED Final   Staphylococcus epidermidis NOT DETECTED NOT DETECTED Final   Staphylococcus lugdunensis NOT DETECTED NOT DETECTED Final   Streptococcus species NOT DETECTED NOT DETECTED Final   Streptococcus agalactiae NOT DETECTED NOT DETECTED Final   Streptococcus pneumoniae NOT DETECTED NOT DETECTED Final    Streptococcus pyogenes NOT DETECTED NOT DETECTED Final   A.calcoaceticus-baumannii NOT DETECTED NOT DETECTED Final   Bacteroides fragilis NOT DETECTED NOT DETECTED Final   Enterobacterales DETECTED (A) NOT DETECTED Final    Comment: Enterobacterales represent a large order of gram negative bacteria, not a single organism. CRITICAL RESULT CALLED TO, READ BACK BY AND VERIFIED WITH: PHARMD D WOFFORD [id number] AT 1046 AM BY CM    Enterobacter cloacae complex DETECTED (A) NOT DETECTED Final    Comment: CRITICAL RESULT CALLED TO, READ BACK BY AND VERIFIED WITH: PHARMD D WOFFORD [id number] AT 1046 AM BY CM    Escherichia coli NOT  DETECTED NOT DETECTED Final   Klebsiella aerogenes NOT DETECTED NOT DETECTED Final   Klebsiella oxytoca NOT DETECTED NOT DETECTED Final   Klebsiella pneumoniae NOT DETECTED NOT DETECTED Final   Proteus species NOT DETECTED NOT DETECTED Final   Salmonella species NOT DETECTED NOT DETECTED Final   Serratia marcescens NOT DETECTED NOT DETECTED Final   Haemophilus influenzae NOT DETECTED NOT DETECTED Final   Neisseria meningitidis NOT DETECTED NOT DETECTED Final   Pseudomonas aeruginosa NOT DETECTED NOT DETECTED Final   Stenotrophomonas maltophilia NOT DETECTED NOT DETECTED Final   Candida albicans NOT DETECTED NOT DETECTED Final   Candida auris NOT DETECTED NOT DETECTED Final   Candida glabrata NOT DETECTED NOT DETECTED Final   Candida krusei NOT DETECTED NOT DETECTED Final   Candida parapsilosis NOT DETECTED NOT DETECTED Final   Candida tropicalis NOT DETECTED NOT DETECTED Final   Cryptococcus neoformans/gattii NOT DETECTED NOT DETECTED Final   CTX-M ESBL NOT DETECTED NOT DETECTED Final   Carbapenem resistance IMP NOT DETECTED NOT DETECTED Final   Carbapenem resistance KPC NOT DETECTED NOT DETECTED Final   Carbapenem resistance NDM NOT DETECTED NOT DETECTED Final   Carbapenem resist OXA 48 LIKE NOT DETECTED NOT DETECTED Final   Vancomycin resistance NOT DETECTED NOT  DETECTED Final   Carbapenem resistance VIM NOT DETECTED NOT DETECTED Final    Comment: Performed at [facility], [address] [place], [place] [address]  Blood Culture (routine x 2)     Status: Abnormal   Collection Time: 04/04/22  1:45 PM   Specimen: BLOOD  Result Value Ref Range Status   Specimen Description   Final    BLOOD RIGHT ANTECUBITAL Performed at [place] [address], [place], [place] [address]    Special Requests   Final    BOTTLES DRAWN AEROBIC AND ANAEROBIC Blood Culture adequate volume Performed at [place] [address] [place], [place] [address]    Culture  Setup Time   Final    GRAM NEGATIVE RODS IN BOTH AEROBIC AND ANAEROBIC BOTTLES CRITICAL VALUE NOTED.  VALUE IS CONSISTENT WITH PREVIOUSLY REPORTED AND CALLED VALUE.    Culture (A)  Final    ENTEROBACTER CLOACAE SUSCEPTIBILITIES PERFORMED ON PREVIOUS CULTURE WITHIN THE LAST 5 DAYS. Performed at [facility], [place] [address] [place], [place] [address]    Report Status 04/10/2022 FINAL  Final  Carbapenem Resistance Panel     Status: None   Collection Time: 04/04/22  4:00 PM  Result Value Ref Range Status   Carba Resistance IMP Gene NOT DETECTED NOT DETECTED Final   Carba Resistance VIM Gene NOT DETECTED NOT DETECTED Final   Carba Resistance NDM Gene NOT DETECTED NOT DETECTED Final   Carba Resistance KPC Gene NOT DETECTED NOT DETECTED Final   Carba Resistance OXA48 Gene NOT DETECTED NOT DETECTED Final    Comment: (NOTE) Cepheid Carba-R is an FDA-cleared nucleic acid amplification test  (NAAT)for the detection and differentiation of genes encoding the  most prevalent carbapenemases in bacterial isolate samples. Carbapenemase gene identification and implementation of comprehensive  infection control measures are recommended by the CDC to prevent the  spread of the resistant organisms. Performed at [facility], [place] [address]  [place],  [address]   Resp Panel by RT-PCR (Flu A&B, Covid)     Status: None   Collection Time: 04/04/22  4:22 PM   Specimen: Nasal Swab  Result Value Ref Range Status   SARS  Coronavirus 2 by RT PCR NEGATIVE NEGATIVE Final    Comment: (NOTE) SARS-CoV-2 target nucleic acids are NOT DETECTED.  The SARS-CoV-2 RNA is generally detectable in upper respiratory specimens during the acute phase of infection. The lowest concentration of SARS-CoV-2 viral copies this assay can detect is 138 copies/mL. A negative result does not preclude SARS-Cov-2 infection and should not be used as the sole basis for treatment or other patient management decisions. A negative result may occur with  improper specimen collection/handling, submission of specimen other than nasopharyngeal swab, presence of viral mutation(s) within the areas targeted by this assay, and inadequate number of viral copies(<138 copies/mL). A negative result must be combined with clinical observations, patient history, and epidemiological information. The expected result is Negative.  Fact Sheet for Patients:  EntrepreneurPulse.com.au  Fact Sheet for Healthcare Providers:  [employer]  This test is no t yet approved or cleared by the [place] FDA and  has been authorized for detection and/or diagnosis of SARS-CoV-2 by FDA under an Emergency Use Authorization (EUA). This EUA will remain  in effect (meaning this test can be used) for the duration of the COVID-19 declaration under Section 564(b)(1) of the Act, 21 U.S.C.section 360bbb-3(b)(1), unless the authorization is terminated  or revoked sooner.       Influenza A by PCR NEGATIVE NEGATIVE Final   Influenza B by PCR NEGATIVE NEGATIVE Final    Comment: (NOTE) The Xpert Xpress SARS-CoV-2/FLU/RSV plus assay is intended as an aid in the diagnosis of influenza from Nasopharyngeal swab specimens and should not be used as a sole  basis for treatment. Nasal washings and aspirates are unacceptable for Xpert Xpress SARS-CoV-2/FLU/RSV testing.  Fact Sheet for Patients: [employer]ur[employer].au  Fact Sheet for Healthcare Providers: [employer]  This test is not yet approved or cleared by the [place] FDA and has been authorized for detection and/or diagnosis of SARS-CoV-2 by FDA under an Emergency Use Authorization (EUA). This EUA will remain in effect (meaning this test can be used) for the duration of the COVID-19 declaration under Section 564(b)(1) of the Act, 21 U.S.C. section 360bbb-3(b)(1), unless the authorization is terminated or revoked.  Performed at [facility], [place] [address], [place], [place] [address]   Culture, blood (Routine X 2) w Reflex to ID Panel     Status: Abnormal   Collection Time: 04/05/22  2:48 PM   Specimen: BLOOD RIGHT ARM  Result Value Ref Range Status   Specimen Description BLOOD RIGHT ARM  Final   Special Requests   Final    BOTTLES DRAWN AEROBIC AND ANAEROBIC Blood Culture adequate volume   Culture  Setup Time   Final    GRAM NEGATIVE RODS ANAEROBIC BOTTLE ONLY CRITICAL RESULT CALLED TO, READ BACK BY AND VERIFIED WITH: PHARMD JUSTIN LEGGE [phone] FCP CRITICAL VALUE NOTED.  VALUE IS CONSISTENT WITH PREVIOUSLY REPORTED AND CALLED VALUE.    Culture (A)  Final    ENTEROBACTER CLOACAE SUSCEPTIBILITIES PERFORMED ON PREVIOUS CULTURE WITHIN THE LAST 5 DAYS. Performed at [facility], [place] [address], [place], [place] [address]    Report Status 04/10/2022 FINAL  Final  Culture, blood (Routine X 2) w Reflex to ID Panel     Status: None   Collection Time: 04/05/22  2:53 PM   Specimen: BLOOD LEFT ARM  Result Value Ref Range Status   Specimen Description BLOOD LEFT ARM  Final   Special Requests   Final    BOTTLES DRAWN AEROBIC ONLY Blood Culture  results may not be optimal due to an inadequate volume of  blood received in culture bottles   Culture   Final    NO GROWTH 5 DAYS Performed at [facility], [place] [address] [place], [place] [address]    Report Status 04/10/2022 FINAL  Final    Time coordinating discharge: 35 minutes  Signed: Angello Chien  [facility] 04/11/2022, 11:21 AM

## 2022-06-18 ENCOUNTER — Encounter: Payer: Self-pay | Admitting: Internal Medicine

## 2022-08-05 ENCOUNTER — Emergency Department (HOSPITAL_COMMUNITY): Payer: Medicare (Managed Care)

## 2022-08-05 ENCOUNTER — Other Ambulatory Visit: Payer: Self-pay

## 2022-08-05 ENCOUNTER — Encounter (HOSPITAL_COMMUNITY): Payer: Self-pay

## 2022-08-05 ENCOUNTER — Inpatient Hospital Stay (HOSPITAL_COMMUNITY)
Admission: EM | Admit: 2022-08-05 | Discharge: 2022-08-10 | DRG: 698 | Disposition: A | Discharge status: Skilled Nursing Facility | Payer: Medicare (Managed Care) | Attending: Family Medicine | Admitting: Family Medicine

## 2022-08-05 DIAGNOSIS — B961 Klebsiella pneumoniae [K. pneumoniae] as the cause of diseases classified elsewhere: Secondary | ICD-10-CM | POA: Diagnosis present

## 2022-08-05 DIAGNOSIS — I129 Hypertensive chronic kidney disease with stage 1 through stage 4 chronic kidney disease, or unspecified chronic kidney disease: Secondary | ICD-10-CM | POA: Diagnosis present

## 2022-08-05 DIAGNOSIS — F02818 Dementia in other diseases classified elsewhere, unspecified severity, with other behavioral disturbance: Secondary | ICD-10-CM | POA: Diagnosis present

## 2022-08-05 DIAGNOSIS — Z9359 Other cystostomy status: Secondary | ICD-10-CM

## 2022-08-05 DIAGNOSIS — B952 Enterococcus as the cause of diseases classified elsewhere: Secondary | ICD-10-CM | POA: Diagnosis present

## 2022-08-05 DIAGNOSIS — F32A Depression, unspecified: Secondary | ICD-10-CM | POA: Diagnosis present

## 2022-08-05 DIAGNOSIS — F0283 Dementia in other diseases classified elsewhere, unspecified severity, with mood disturbance: Secondary | ICD-10-CM | POA: Diagnosis present

## 2022-08-05 DIAGNOSIS — Y846 Urinary catheterization as the cause of abnormal reaction of the patient, or of later complication, without mention of misadventure at the time of the procedure: Secondary | ICD-10-CM | POA: Diagnosis present

## 2022-08-05 DIAGNOSIS — T83511A Infection and inflammatory reaction due to indwelling urethral catheter, initial encounter: Secondary | ICD-10-CM | POA: Diagnosis not present

## 2022-08-05 DIAGNOSIS — F05 Delirium due to known physiological condition: Secondary | ICD-10-CM | POA: Diagnosis present

## 2022-08-05 DIAGNOSIS — G9341 Metabolic encephalopathy: Secondary | ICD-10-CM | POA: Diagnosis present

## 2022-08-05 DIAGNOSIS — I700 Atherosclerosis of aorta: Secondary | ICD-10-CM | POA: Diagnosis present

## 2022-08-05 DIAGNOSIS — N390 Urinary tract infection, site not specified: Secondary | ICD-10-CM | POA: Diagnosis present

## 2022-08-05 DIAGNOSIS — Z801 Family history of malignant neoplasm of trachea, bronchus and lung: Secondary | ICD-10-CM

## 2022-08-05 DIAGNOSIS — E785 Hyperlipidemia, unspecified: Secondary | ICD-10-CM | POA: Diagnosis present

## 2022-08-05 DIAGNOSIS — Z1152 Encounter for screening for COVID-19: Secondary | ICD-10-CM

## 2022-08-05 DIAGNOSIS — N1832 Chronic kidney disease, stage 3b: Secondary | ICD-10-CM | POA: Diagnosis present

## 2022-08-05 DIAGNOSIS — G4700 Insomnia, unspecified: Secondary | ICD-10-CM | POA: Diagnosis present

## 2022-08-05 DIAGNOSIS — T83518A Infection and inflammatory reaction due to other urinary catheter, initial encounter: Secondary | ICD-10-CM | POA: Diagnosis not present

## 2022-08-05 DIAGNOSIS — N179 Acute kidney failure, unspecified: Secondary | ICD-10-CM | POA: Diagnosis present

## 2022-08-05 DIAGNOSIS — R4182 Altered mental status, unspecified: Principal | ICD-10-CM

## 2022-08-05 DIAGNOSIS — R338 Other retention of urine: Secondary | ICD-10-CM | POA: Diagnosis present

## 2022-08-05 DIAGNOSIS — G20A1 Parkinson's disease without dyskinesia, without mention of fluctuations: Secondary | ICD-10-CM | POA: Diagnosis present

## 2022-08-05 DIAGNOSIS — Z87891 Personal history of nicotine dependence: Secondary | ICD-10-CM

## 2022-08-05 DIAGNOSIS — Z87440 Personal history of urinary (tract) infections: Secondary | ICD-10-CM

## 2022-08-05 DIAGNOSIS — Z978 Presence of other specified devices: Secondary | ICD-10-CM

## 2022-08-05 DIAGNOSIS — I10 Essential (primary) hypertension: Secondary | ICD-10-CM | POA: Diagnosis present

## 2022-08-05 DIAGNOSIS — Z79899 Other long term (current) drug therapy: Secondary | ICD-10-CM

## 2022-08-05 DIAGNOSIS — Z1624 Resistance to multiple antibiotics: Secondary | ICD-10-CM | POA: Diagnosis present

## 2022-08-05 DIAGNOSIS — N320 Bladder-neck obstruction: Secondary | ICD-10-CM | POA: Diagnosis present

## 2022-08-05 DIAGNOSIS — Z8616 Personal history of COVID-19: Secondary | ICD-10-CM

## 2022-08-05 DIAGNOSIS — Z823 Family history of stroke: Secondary | ICD-10-CM

## 2022-08-05 DIAGNOSIS — Z1611 Resistance to penicillins: Secondary | ICD-10-CM | POA: Diagnosis present

## 2022-08-05 DIAGNOSIS — N401 Enlarged prostate with lower urinary tract symptoms: Secondary | ICD-10-CM | POA: Diagnosis present

## 2022-08-05 DIAGNOSIS — R54 Age-related physical debility: Secondary | ICD-10-CM | POA: Diagnosis present

## 2022-08-05 LAB — CBC WITH DIFFERENTIAL/PLATELET
Abs Immature Granulocytes: 0.03 10*3/uL (ref 0.00–0.07)
Basophils Absolute: 0 10*3/uL (ref 0.0–0.1)
Basophils Relative: 0 %
Eosinophils Absolute: 0.1 10*3/uL (ref 0.0–0.5)
Eosinophils Relative: 1 %
HCT: 31.2 % — ABNORMAL LOW (ref 39.0–52.0)
Hemoglobin: 9.7 g/dL — ABNORMAL LOW (ref 13.0–17.0)
Immature Granulocytes: 0 %
Lymphocytes Relative: 11 %
Lymphs Abs: 1.2 10*3/uL (ref 0.7–4.0)
MCH: 25.9 pg — ABNORMAL LOW (ref 26.0–34.0)
MCHC: 31.1 g/dL (ref 30.0–36.0)
MCV: 83.4 fL (ref 80.0–100.0)
Monocytes Absolute: 0.7 10*3/uL (ref 0.1–1.0)
Monocytes Relative: 6 %
Neutro Abs: 8.8 10*3/uL — ABNORMAL HIGH (ref 1.7–7.7)
Neutrophils Relative %: 82 %
Platelets: 187 10*3/uL (ref 150–400)
RBC: 3.74 MIL/uL — ABNORMAL LOW (ref 4.22–5.81)
RDW: 13.2 % (ref 11.5–15.5)
WBC: 10.8 10*3/uL — ABNORMAL HIGH (ref 4.0–10.5)
nRBC: 0 % (ref 0.0–0.2)

## 2022-08-05 LAB — LACTIC ACID, PLASMA
Lactic Acid, Venous: 1.5 mmol/L (ref 0.5–1.9)
Lactic Acid, Venous: 1.6 mmol/L (ref 0.5–1.9)

## 2022-08-05 LAB — COMPREHENSIVE METABOLIC PANEL
ALT: 7 U/L (ref 0–44)
AST: 30 U/L (ref 15–41)
Albumin: 3.2 g/dL — ABNORMAL LOW (ref 3.5–5.0)
Alkaline Phosphatase: 60 U/L (ref 38–126)
Anion gap: 6 (ref 5–15)
BUN: 26 mg/dL — ABNORMAL HIGH (ref 8–23)
CO2: 28 mmol/L (ref 22–32)
Calcium: 8.8 mg/dL — ABNORMAL LOW (ref 8.9–10.3)
Chloride: 104 mmol/L (ref 98–111)
Creatinine, Ser: 1.66 mg/dL — ABNORMAL HIGH (ref 0.61–1.24)
GFR, Estimated: 42 mL/min — ABNORMAL LOW (ref >60)
Glucose, Bld: 142 mg/dL — ABNORMAL HIGH (ref 70–99)
Potassium: 3.5 mmol/L (ref 3.5–5.1)
Sodium: 138 mmol/L (ref 135–145)
Total Bilirubin: 1.4 mg/dL — ABNORMAL HIGH (ref 0.3–1.2)
Total Protein: 7.4 g/dL (ref 6.5–8.1)

## 2022-08-05 LAB — CBG MONITORING, ED: Glucose-Capillary: 113 mg/dL — ABNORMAL HIGH (ref 70–99)

## 2022-08-05 LAB — RESP PANEL BY RT-PCR (RSV, FLU A&B, COVID)  RVPGX2
Influenza A by PCR: NEGATIVE
Influenza B by PCR: NEGATIVE
Resp Syncytial Virus by PCR: NEGATIVE
SARS Coronavirus 2 by RT PCR: NEGATIVE

## 2022-08-05 LAB — PROTIME-INR
INR: 1.3 — ABNORMAL HIGH (ref 0.8–1.2)
Prothrombin Time: 15.7 seconds — ABNORMAL HIGH (ref 11.4–15.2)

## 2022-08-05 LAB — APTT: aPTT: 44 seconds — ABNORMAL HIGH (ref 24–36)

## 2022-08-05 MED ORDER — LACTATED RINGERS IV BOLUS (SEPSIS)
1000.0000 mL | Freq: Once | INTRAVENOUS | Status: AC
  Administered 2022-08-05: 1000 mL via INTRAVENOUS

## 2022-08-05 MED ORDER — LACTATED RINGERS IV SOLN: INTRAVENOUS | Status: AC

## 2022-08-05 MED ORDER — SODIUM CHLORIDE 0.9 % IV SOLN
2.0000 g | Freq: Once | INTRAVENOUS | Status: AC
  Administered 2022-08-05: 2 g via INTRAVENOUS
  Filled 2022-08-05: qty 12.5

## 2022-08-05 NOTE — Sepsis Progress Note (Signed)
Elink monitoring for the code sepsis protocol.  

## 2022-08-05 NOTE — Progress Notes (Signed)
A consult was received from an ED physician for cefepime per pharmacy dosing.  The patient's profile has been reviewed for ht/wt/allergies/indication/available labs.    A one time order has been placed for cefepime 2gm IV x1.  Further antibiotics/pharmacy consults should be ordered by admitting physician if indicated.                       Thank you, Lynelle Doctor 08/05/2022  7:35 PM

## 2022-08-05 NOTE — ED Triage Notes (Addendum)
Pt BIB EMS from home. Per family pt is altered from baseline since yesterday. Pt has foley catheter that gets replaced monthly. Hx dementia

## 2022-08-05 NOTE — ED Provider Notes (Signed)
[place] EMERGENCY DEPARTMENT AT [facility] Provider Note   CSN: [id number] Arrival date & time: 08/05/22  1842     History  Chief Complaint  Patient presents with   Altered Mental Status    Theodore King is a 77 y.o. male.  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  Spoke with the patient's daughter [name] who cares for the patient at her home.  She reports that this morning was really when she noticed him not acting himself.  She reports he was not as responsive as he normally was and he was leaning to the left and actually even slid out of his chair.  He also ate very little.  He goes to a program with [employer] during the day and she thought maybe it was just related to his Parkinson's and dementia and once he got to pace and had his medicine he would be okay.  However when she went to get him today the nurse had the same concerns that he was not not his usual self and they were concerned.  She also reports he has not eaten the rest of the day and he is normally a very good eater.  She reports he recently had his Foley catheter changed but had noticed a little blood around the catheter site.   The history is limited by the condition of the patient and the absence of a caregiver.  Altered Mental Status      Home Medications Prior to Admission medications   Medication Sig Start Date End Date Taking? Authorizing Provider  acetaminophen (TYLENOL) 500 MG tablet Take 500 mg by mouth every 6 (six) hours as needed for mild pain.    [provider]  albuterol (VENTOLIN HFA) 108 (90 Base) MCG/ACT inhaler Inhale 2 puffs into the lungs every 4 (four) hours as needed for wheezing or  shortness of breath.  10/03/18   [provider]  carbidopa-levodopa (SINEMET CR) 50-200 MG tablet Take 1 tablet by mouth at bedtime. 09/24/21   Penumalli, Earlean Polka, MD  carbidopa-levodopa (SINEMET IR) 25-100 MG tablet Take 2 tablets at 7 AM, 2 tablets at 10 AM, 2 tablets at 2 PM and 1 tablet at 6 PM Patient taking differently: Take 1-2 tablets by mouth See admin instructions. Take 2 tablets by mouth every morning and afternoon, take 1 tablet at bedtime 09/24/21   Penumalli, Earlean Polka, MD  fludrocortisone (FLORINEF) 0.1 MG tablet Take 0.1 mg by mouth daily.    [provider]  glycopyrrolate (ROBINUL) 1 MG tablet Take 1 tablet (1 mg total) by mouth 2 (two) times daily. 09/24/21   Penumalli, Earlean Polka, MD  sertraline (ZOLOFT) 25 MG tablet Take 1 tablet (25 mg total) by mouth daily. 09/24/21   Penumalli, Earlean Polka, MD  traZODone (DESYREL) 50 MG tablet Take 50 mg by mouth at bedtime. 08/17/20   [provider]      Allergies    Patient has no known allergies.    Review of Systems   Review of Systems  Physical Exam Updated Vital Signs BP (!) 138/56 (BP Location: Right Arm)   Pulse 69   Temp 99.1 F (37.3 C) (Oral)   Resp 19   Ht '6\' 1"'$  (1.854  m)   Wt 77.2 kg   BMI 22.45 kg/m  Physical Exam Vitals and nursing note reviewed.  Constitutional:      General: He is not in acute distress.    Appearance: He is well-developed.  HENT:     Head: Normocephalic and atraumatic.     Mouth/Throat:     Mouth: Mucous membranes are dry.  Eyes:     Conjunctiva/sclera: Conjunctivae normal.     Pupils: Pupils are equal, round, and reactive to light.  Cardiovascular:     Rate and Rhythm: Normal rate and regular rhythm.     Heart sounds: No murmur heard. Pulmonary:     Effort: Pulmonary effort is normal. No respiratory distress.     Breath sounds: Normal breath sounds. No wheezing or rales.  Abdominal:     General: There is no distension.     Palpations: Abdomen is soft.      Tenderness: There is no abdominal tenderness. There is no guarding or rebound.  Genitourinary:    Comments: Foley draining dark cloudy urine and blood at the meatus Musculoskeletal:        General: No tenderness. Normal range of motion.     Cervical back: Normal range of motion and neck supple.  Skin:    General: Skin is warm and dry.     Findings: No erythema or rash.     Comments: Hot to the touch  Neurological:     Mental Status: He is alert.     Comments: Can follow some commands and noted to move all extremities but unintelligible speech  Psychiatric:        Behavior: Behavior normal.     Comments: calm     ED Results / Procedures / Treatments   Labs (all labs ordered are listed, but only abnormal results are displayed) Labs Reviewed  CBG MONITORING, ED - Abnormal; Notable for the following components:      Result Value   Glucose-Capillary 113 (*)    All other components within normal limits    EKG None  Radiology No results found.  Procedures Procedures    Medications Ordered in ED Medications  lactated ringers infusion (has no administration in time range)  lactated ringers bolus 1,000 mL (has no administration in time range)  ceFEPIme (MAXIPIME) 2 g in sodium chloride 0.9 % 100 mL IVPB (has no administration in time range)    ED Course/ Medical Decision Making/ A&P                             Medical Decision Making Amount and/or Complexity of Data Reviewed Labs: ordered. Radiology: ordered. ECG/medicine tests: ordered.  Risk Prescription drug management.   Pt with multiple medical problems and comorbidities and presenting today with a complaint that caries a high risk for morbidity and mortality.  Here today with altered mental status, fever and concern for sepsis.  Patient is hemodynamically stable at this time however hot to the touch.  Patient does not appear to have any abdominal pain but cannot communicate well verbally.  Patient has history  of recurrent sepsis related to UTIs.  Presumed sepsis was initiated.  I have independently visualized and interpreted pt's images today.  Chest x-ray today without evidence of pneumonia.  9:34 PM I independently interpreted patient's labs and EKG.  EKG without acute findings, lactic acid within normal limits today, CMP with mild AKI with creatinine of 1.66 from 1.4 but normal  anion gap and electrolytes, CBC with minimal leukocytosis of 10.8 and stable hemoglobin of 9.7 which prior have been in the eights.  Viral panel is negative.  Patient has remained hemodynamically stable.  Foley catheter was changed and initially did have significant bleeding and a blood clot that returned but now patient has urine flowing.  Waiting on urine test.  Patient was covered with antibiotics recommended for indwelling Foley catheters.  He does not appear to have any abdominal pain or evidence of cellulitis at this time.  Daughter confirms that he has had no change in his medications and she does not recall any accidents where he is fallen or hit his head but we will do a CT to ensure no acute bleeding or other causes for his abrupt change in behavior.          Final Clinical Impression(s) / ED Diagnoses Final diagnoses:  None    Rx / DC Orders ED Discharge Orders     None         Blanchie Dessert, MD 08/09/22 [phone]

## 2022-08-06 DIAGNOSIS — G20A1 Parkinson's disease without dyskinesia, without mention of fluctuations: Secondary | ICD-10-CM

## 2022-08-06 DIAGNOSIS — B961 Klebsiella pneumoniae [K. pneumoniae] as the cause of diseases classified elsewhere: Secondary | ICD-10-CM | POA: Diagnosis present

## 2022-08-06 DIAGNOSIS — G9341 Metabolic encephalopathy: Secondary | ICD-10-CM | POA: Diagnosis present

## 2022-08-06 DIAGNOSIS — F32A Depression, unspecified: Secondary | ICD-10-CM | POA: Diagnosis present

## 2022-08-06 DIAGNOSIS — G4700 Insomnia, unspecified: Secondary | ICD-10-CM | POA: Diagnosis present

## 2022-08-06 DIAGNOSIS — R338 Other retention of urine: Secondary | ICD-10-CM | POA: Diagnosis present

## 2022-08-06 DIAGNOSIS — N390 Urinary tract infection, site not specified: Secondary | ICD-10-CM | POA: Diagnosis present

## 2022-08-06 DIAGNOSIS — Z1152 Encounter for screening for COVID-19: Secondary | ICD-10-CM | POA: Diagnosis not present

## 2022-08-06 DIAGNOSIS — T83511A Infection and inflammatory reaction due to indwelling urethral catheter, initial encounter: Secondary | ICD-10-CM

## 2022-08-06 DIAGNOSIS — Z8616 Personal history of COVID-19: Secondary | ICD-10-CM | POA: Diagnosis not present

## 2022-08-06 DIAGNOSIS — Z1624 Resistance to multiple antibiotics: Secondary | ICD-10-CM | POA: Diagnosis present

## 2022-08-06 DIAGNOSIS — N320 Bladder-neck obstruction: Secondary | ICD-10-CM | POA: Diagnosis present

## 2022-08-06 DIAGNOSIS — N179 Acute kidney failure, unspecified: Secondary | ICD-10-CM | POA: Diagnosis present

## 2022-08-06 DIAGNOSIS — Z79899 Other long term (current) drug therapy: Secondary | ICD-10-CM | POA: Diagnosis not present

## 2022-08-06 DIAGNOSIS — N1832 Chronic kidney disease, stage 3b: Secondary | ICD-10-CM | POA: Diagnosis present

## 2022-08-06 DIAGNOSIS — Z1611 Resistance to penicillins: Secondary | ICD-10-CM | POA: Diagnosis present

## 2022-08-06 DIAGNOSIS — I129 Hypertensive chronic kidney disease with stage 1 through stage 4 chronic kidney disease, or unspecified chronic kidney disease: Secondary | ICD-10-CM | POA: Diagnosis present

## 2022-08-06 DIAGNOSIS — Y846 Urinary catheterization as the cause of abnormal reaction of the patient, or of later complication, without mention of misadventure at the time of the procedure: Secondary | ICD-10-CM | POA: Diagnosis present

## 2022-08-06 DIAGNOSIS — F02818 Dementia in other diseases classified elsewhere, unspecified severity, with other behavioral disturbance: Secondary | ICD-10-CM | POA: Diagnosis present

## 2022-08-06 DIAGNOSIS — F0283 Dementia in other diseases classified elsewhere, unspecified severity, with mood disturbance: Secondary | ICD-10-CM | POA: Diagnosis present

## 2022-08-06 DIAGNOSIS — E785 Hyperlipidemia, unspecified: Secondary | ICD-10-CM | POA: Diagnosis present

## 2022-08-06 DIAGNOSIS — T83518A Infection and inflammatory reaction due to other urinary catheter, initial encounter: Secondary | ICD-10-CM | POA: Diagnosis present

## 2022-08-06 DIAGNOSIS — Z87891 Personal history of nicotine dependence: Secondary | ICD-10-CM | POA: Diagnosis not present

## 2022-08-06 DIAGNOSIS — I10 Essential (primary) hypertension: Secondary | ICD-10-CM

## 2022-08-06 DIAGNOSIS — I700 Atherosclerosis of aorta: Secondary | ICD-10-CM | POA: Diagnosis present

## 2022-08-06 DIAGNOSIS — F05 Delirium due to known physiological condition: Secondary | ICD-10-CM | POA: Diagnosis present

## 2022-08-06 LAB — URINALYSIS, ROUTINE W REFLEX MICROSCOPIC
Bilirubin Urine: NEGATIVE
Glucose, UA: NEGATIVE mg/dL
Ketones, ur: NEGATIVE mg/dL
Nitrite: NEGATIVE
Protein, ur: 100 mg/dL — AB
RBC / HPF: >50 RBC/hpf (ref 0–5)
Specific Gravity, Urine: 1.015 (ref 1.005–1.030)
WBC, UA: >50 WBC/hpf (ref 0–5)
pH: 6 (ref 5.0–8.0)

## 2022-08-06 LAB — CBC
HCT: 31.4 % — ABNORMAL LOW (ref 39.0–52.0)
Hemoglobin: 9.9 g/dL — ABNORMAL LOW (ref 13.0–17.0)
MCH: 26.2 pg (ref 26.0–34.0)
MCHC: 31.5 g/dL (ref 30.0–36.0)
MCV: 83.1 fL (ref 80.0–100.0)
Platelets: 151 10*3/uL (ref 150–400)
RBC: 3.78 MIL/uL — ABNORMAL LOW (ref 4.22–5.81)
RDW: 13.3 % (ref 11.5–15.5)
WBC: 8.6 10*3/uL (ref 4.0–10.5)
nRBC: 0 % (ref 0.0–0.2)

## 2022-08-06 LAB — BASIC METABOLIC PANEL
Anion gap: 5 (ref 5–15)
BUN: 25 mg/dL — ABNORMAL HIGH (ref 8–23)
CO2: 27 mmol/L (ref 22–32)
Calcium: 8.7 mg/dL — ABNORMAL LOW (ref 8.9–10.3)
Chloride: 106 mmol/L (ref 98–111)
Creatinine, Ser: 1.47 mg/dL — ABNORMAL HIGH (ref 0.61–1.24)
GFR, Estimated: 49 mL/min — ABNORMAL LOW (ref >60)
Glucose, Bld: 137 mg/dL — ABNORMAL HIGH (ref 70–99)
Potassium: 3.5 mmol/L (ref 3.5–5.1)
Sodium: 138 mmol/L (ref 135–145)

## 2022-08-06 MED ORDER — ONDANSETRON HCL 4 MG PO TABS: 4.0000 mg | ORAL_TABLET | Freq: Four times a day (QID) | ORAL | Status: DC | PRN

## 2022-08-06 MED ORDER — CARBIDOPA-LEVODOPA 25-100 MG PO TABS: 1.0000 | ORAL_TABLET | ORAL | Status: DC

## 2022-08-06 MED ORDER — CARBIDOPA-LEVODOPA 25-100 MG PO TABS
1.0000 | ORAL_TABLET | Freq: Every day | ORAL | Status: DC
  Administered 2022-08-06 – 2022-08-09 (×4): 1 via ORAL
  Filled 2022-08-06 (×4): qty 1

## 2022-08-06 MED ORDER — CARBIDOPA-LEVODOPA 25-100 MG PO TABS
2.0000 | ORAL_TABLET | Freq: Two times a day (BID) | ORAL | Status: DC
  Administered 2022-08-06 – 2022-08-10 (×9): 2 via ORAL
  Filled 2022-08-06 (×9): qty 2

## 2022-08-06 MED ORDER — ENOXAPARIN SODIUM 40 MG/0.4ML IJ SOSY
40.0000 mg | PREFILLED_SYRINGE | INTRAMUSCULAR | Status: DC
  Administered 2022-08-06 – 2022-08-10 (×5): 40 mg via SUBCUTANEOUS
  Filled 2022-08-06 (×5): qty 0.4

## 2022-08-06 MED ORDER — CHLORHEXIDINE GLUCONATE CLOTH 2 % EX PADS
6.0000 | MEDICATED_PAD | Freq: Every day | CUTANEOUS | Status: DC
  Administered 2022-08-06 – 2022-08-10 (×5): 6 via TOPICAL

## 2022-08-06 MED ORDER — ACETAMINOPHEN 325 MG PO TABS
650.0000 mg | ORAL_TABLET | Freq: Four times a day (QID) | ORAL | Status: DC | PRN
  Administered 2022-08-09: 650 mg via ORAL
  Filled 2022-08-06: qty 2

## 2022-08-06 MED ORDER — CARBIDOPA-LEVODOPA ER 50-200 MG PO TBCR
1.0000 | EXTENDED_RELEASE_TABLET | Freq: Every day | ORAL | Status: DC
  Administered 2022-08-06 – 2022-08-09 (×4): 1 via ORAL
  Filled 2022-08-06 (×5): qty 1

## 2022-08-06 MED ORDER — SODIUM CHLORIDE 0.9 % IV SOLN
1.0000 g | Freq: Two times a day (BID) | INTRAVENOUS | Status: DC
  Administered 2022-08-06 – 2022-08-08 (×5): 1 g via INTRAVENOUS
  Filled 2022-08-06 (×6): qty 20

## 2022-08-06 MED ORDER — ACETAMINOPHEN 650 MG RE SUPP: 650.0000 mg | Freq: Four times a day (QID) | RECTAL | Status: DC | PRN

## 2022-08-06 MED ORDER — ONDANSETRON HCL 4 MG/2ML IJ SOLN: 4.0000 mg | Freq: Four times a day (QID) | INTRAMUSCULAR | Status: DC | PRN

## 2022-08-06 NOTE — Assessment & Plan Note (Signed)
Continue sinemet per current home regimen

## 2022-08-06 NOTE — Assessment & Plan Note (Addendum)
Pt with h/o MDRO Enterobacter Cloacae causing UTI and bacteremia in Oct, organism was resistant to zosyn, cefepime, and fortaz (see blood culture specifically) (? ESBL, didn't detect CTX-M ESBL gene on the assay in Oct but that resistance profile sure looks suspicious for an ESBL to me) Also had UTI and bacteremia with a pan-sensitive enterococcus at the same time. Now in with AMS similar to prior UTI symptoms as well as pyuria that is certainly suspicious for UTI. Empiric carbapenem for coverage of ESBL / MDRO given above HPI from October. IVF BCx and UCx pending Foley changed out

## 2022-08-06 NOTE — ED Provider Notes (Signed)
I assumed care of this patient.  Please see previous provider note for further details of Hx, PE.  Briefly patient is a 77 y.o. male who presented AMS. Presumed foley related UTI, pending UA. Stared on Abx.  UA with uti. Admitted to medicine      Alleen Kehm, Grayce Sessions, MD 08/06/22 [phone]

## 2022-08-06 NOTE — Progress Notes (Signed)
Pharmacy Antibiotic Note  Theodore King is a 77 y.o. male admitted on 08/05/2022 with UTI.  He has indwelling foley catheter with frequent hospitalizations for infection.  Most recent admission Oct 2023 with blood & urine cx + enterobacter cloacae resistant to Zosyn & cephalosporins.  Pharmacy has been consulted for Meropenem dosing. CKD  Plan: Meropenem 1gm IV q12h  Height: '6\' 1"'$  (185.4 cm) Weight: 77.2 kg (170 lb 3.1 oz) IBW/kg (Calculated) : 79.9  Temp (24hrs), Avg:99 F (37.2 C), Min:98.5 F (36.9 C), Max:99.3 F (37.4 C)  Recent Labs  Lab 08/05/22 2041 08/05/22 2100  WBC 10.8*  --   CREATININE 1.66*  --   LATICACIDVEN 1.5 1.6    Estimated Creatinine Clearance: 40 mL/min (A) (by C-G formula based on SCr of 1.66 mg/dL (H)).    No Known Allergies  Antimicrobials this admission: 2/29 Cefepime x1 3/1 Meropenem >>   Dose adjustments this admission:  Microbiology results: 2/29 BCx:  2/29 UCx:   2/29 Resp PCR: negative  Thank you for allowing pharmacy to be a part of this patient's care.  [name] PharmD 08/06/2022 2:39 AM

## 2022-08-06 NOTE — ED Notes (Signed)
ED TO INPATIENT HANDOFF REPORT  ED Nurse Name and Phone #: Tonette Bihari [phone]  S Name/Age/Gender Theodore King 77 y.o. male Room/Bed: WA19/WA19  Code Status   Code Status: Full Code  Home/SNF/Other Home Patient oriented to: name, birthday Is this baseline? No   Triage Complete: Triage complete  Chief Complaint Catheter-associated urinary tract infection ([place]) [id number], N39.0]  Triage Note Pt BIB EMS from home. Per family pt is altered from baseline since yesterday. Pt has foley catheter that gets replaced monthly. Hx dementia   Allergies No Known Allergies  Level of Care/Admitting Diagnosis ED Disposition     ED Disposition  Admit   Condition  --   Comment  Hospital Area: [facility] [[id number]]  Level of Care: Med-Surg [[id number]]  May place patient in observation at [facility] or [place] if equivalent level of care is available:: No  Covid Evaluation: Asymptomatic - no recent exposure (last 10 days) testing not required  Diagnosis: Catheter-associated urinary tract infection [facility]) [id number]  Admitting Physician: Etta Quill [phone]  Attending Physician: Etta Quill [[id number]]          B Medical/Surgery History Past Medical History:  Diagnosis Date   Abnormal dreams 09/06/2013   Aortic atherosclerosis ([place]) 10/23/2021   BPH (benign prostatic hyperplasia) 10/23/2021   Disorders of bursae and tendons in shoulder region, unspecified    Hyperglycemia 05/18/2013   Hyperlipidemia LDL goal < 100 05/18/2013   Other and unspecified hyperlipidemia    Other and unspecified hyperlipidemia    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)    Paralysis agitans 05/18/2013   Parkinson's disease    Pneumonia due to COVID-19 virus 04/08/2019   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   SOB (shortness of breath) 03/23/2018   Unspecified vitamin D deficiency    Unspecified vitamin D deficiency    Vertigo, benign paroxysmal 11/05/2016   Vitamin D  deficiency 05/18/2013   Past Surgical History:  Procedure Laterality Date   APPENDECTOMY     COLONOSCOPY WITH PROPOFOL N/A 10/26/2021   Procedure: COLONOSCOPY WITH PROPOFOL;  Surgeon: Irene Shipper, MD;  Location: [facility];  Service: Gastroenterology;  Laterality: N/A;   POLYPECTOMY  10/26/2021   Procedure: POLYPECTOMY;  Surgeon: Irene Shipper, MD;  Location: [facility];  Service: Gastroenterology;;   Cherre Robins CUFF REPAIR     right     A IV Location/Drains/Wounds Patient Lines/Drains/Airways Status     Active Line/Drains/Airways     Name Placement date Placement time Site Days   Peripheral IV 08/05/22 20 G Anterior;Distal;Left;Upper Arm 08/05/22  2048  Arm  1   Peripheral IV 08/05/22 22 G Posterior;Right Hand 08/05/22  2048  Hand  1   Urethral Catheter Luz Brazen, RN Double-lumen 20 Fr. 08/05/22  2210  Double-lumen  1   Pressure Injury 01/31/22 Buttocks Mid;Lower Stage 1 -  Intact skin with non-blanchable redness of a localized area usually over a bony prominence. 01/31/22  2325  -- 187   Wound / Incision (Open or Dehisced) 01/31/22 Skin tear Thigh Distal;Left;Posterior Skin Tear: Thigh 01/31/22  2325  Thigh  187            Intake/Output Last 24 hours No intake or output data in the 24 hours ending 08/06/22 0327  Labs/Imaging Results for orders placed or performed during the hospital encounter of 08/05/22 (from the past 48 hour(s))  CBG monitoring, ED     Status: Abnormal   Collection Time: 08/05/22  6:51 PM  Result  Value Ref Range   Glucose-Capillary 113 (H) 70 - 99 mg/dL    Comment: Glucose reference range applies only to samples taken after fasting for at least 8 hours.  Resp panel by RT-PCR (RSV, Flu A&B, Covid) Anterior Nasal Swab     Status: None   Collection Time: 08/05/22  8:11 PM   Specimen: Anterior Nasal Swab  Result Value Ref Range   SARS Coronavirus 2 by RT PCR NEGATIVE NEGATIVE    Comment: (NOTE) SARS-CoV-2 target nucleic acids are NOT DETECTED.  The  SARS-CoV-2 RNA is generally detectable in upper respiratory specimens during the acute phase of infection. The lowest concentration of SARS-CoV-2 viral copies this assay can detect is 138 copies/mL. A negative result does not preclude SARS-Cov-2 infection and should not be used as the sole basis for treatment or other patient management decisions. A negative result may occur with  improper specimen collection/handling, submission of specimen other than nasopharyngeal swab, presence of viral mutation(s) within the areas targeted by this assay, and inadequate number of viral copies(<138 copies/mL). A negative result must be combined with clinical observations, patient history, and epidemiological information. The expected result is Negative.  Fact Sheet for Patients:  EntrepreneurPulse.com.au  Fact Sheet for Healthcare Providers:  IncredibleEmployment.be  This test is no t yet approved or cleared by the [place] FDA and  has been authorized for detection and/or diagnosis of SARS-CoV-2 by FDA under an Emergency Use Authorization (EUA). This EUA will remain  in effect (meaning this test can be used) for the duration of the COVID-19 declaration under Section 564(b)(1) of the Act, 21 U.S.C.section 360bbb-3(b)(1), unless the authorization is terminated  or revoked sooner.       Influenza A by PCR NEGATIVE NEGATIVE   Influenza B by PCR NEGATIVE NEGATIVE    Comment: (NOTE) The Xpert Xpress SARS-CoV-2/FLU/RSV plus assay is intended as an aid in the diagnosis of influenza from Nasopharyngeal swab specimens and should not be used as a sole basis for treatment. Nasal washings and aspirates are unacceptable for Xpert Xpress SARS-CoV-2/FLU/RSV testing.  Fact Sheet for Patients: EntrepreneurPulse.com.au  Fact Sheet for Healthcare Providers: IncredibleEmployment.be  This test is not yet approved or cleared by the  [place] FDA and has been authorized for detection and/or diagnosis of SARS-CoV-2 by FDA under an Emergency Use Authorization (EUA). This EUA will remain in effect (meaning this test can be used) for the duration of the COVID-19 declaration under Section 564(b)(1) of the Act, 21 U.S.C. section 360bbb-3(b)(1), unless the authorization is terminated or revoked.     Resp Syncytial Virus by PCR NEGATIVE NEGATIVE    Comment: (NOTE) Fact Sheet for Patients: EntrepreneurPulse.com.au  Fact Sheet for Healthcare Providers: IncredibleEmployment.be  This test is not yet approved or cleared by the [place] FDA and has been authorized for detection and/or diagnosis of SARS-CoV-2 by FDA under an Emergency Use Authorization (EUA). This EUA will remain in effect (meaning this test can be used) for the duration of the COVID-19 declaration under Section 564(b)(1) of the Act, 21 U.S.C. section 360bbb-3(b)(1), unless the authorization is terminated or revoked.  Performed at [facility], [place] [address], [place], [place] [address]   Lactic acid, plasma     Status: None   Collection Time: 08/05/22  8:41 PM  Result Value Ref Range   Lactic Acid, Venous 1.5 0.5 - 1.9 mmol/L    Comment: Performed at [facility], [place] [address] [place], [place] [address]  Comprehensive metabolic  panel     Status: Abnormal   Collection Time: 08/05/22  8:41 PM  Result Value Ref Range   Sodium 138 135 - 145 mmol/L   Potassium 3.5 3.5 - 5.1 mmol/L   Chloride 104 98 - 111 mmol/L   CO2 28 22 - 32 mmol/L   Glucose, Bld 142 (H) 70 - 99 mg/dL    Comment: Glucose reference range applies only to samples taken after fasting for at least 8 hours.   BUN 26 (H) 8 - 23 mg/dL   Creatinine, Ser 1.66 (H) 0.61 - 1.24 mg/dL   Calcium 8.8 (L) 8.9 - 10.3 mg/dL   Total Protein 7.4 6.5 - 8.1 g/dL   Albumin 3.2 (L) 3.5 - 5.0 g/dL   AST 30 15 - 41 U/L    ALT 7 0 - 44 U/L   Alkaline Phosphatase 60 38 - 126 U/L   Total Bilirubin 1.4 (H) 0.3 - 1.2 mg/dL   GFR, Estimated 42 (L) >60 mL/min    Comment: (NOTE) Calculated using the CKD-EPI Creatinine Equation (2021)    Anion gap 6 5 - 15    Comment: Performed at [facility], [place] [address] [place], [place] [address]  CBC with Differential     Status: Abnormal   Collection Time: 08/05/22  8:41 PM  Result Value Ref Range   WBC 10.8 (H) 4.0 - 10.5 K/uL   RBC 3.74 (L) 4.22 - 5.81 MIL/uL   Hemoglobin 9.7 (L) 13.0 - 17.0 g/dL   HCT 31.2 (L) 39.0 - 52.0 %   MCV 83.4 80.0 - 100.0 fL   MCH 25.9 (L) 26.0 - 34.0 pg   MCHC 31.1 30.0 - 36.0 g/dL   RDW 13.2 11.5 - 15.5 %   Platelets 187 150 - 400 K/uL   nRBC 0.0 0.0 - 0.2 %   Neutrophils Relative % 82 %   Neutro Abs 8.8 (H) 1.7 - 7.7 K/uL   Lymphocytes Relative 11 %   Lymphs Abs 1.2 0.7 - 4.0 K/uL   Monocytes Relative 6 %   Monocytes Absolute 0.7 0.1 - 1.0 K/uL   Eosinophils Relative 1 %   Eosinophils Absolute 0.1 0.0 - 0.5 K/uL   Basophils Relative 0 %   Basophils Absolute 0.0 0.0 - 0.1 K/uL   Immature Granulocytes 0 %   Abs Immature Granulocytes 0.03 0.00 - 0.07 K/uL    Comment: Performed at [facility], [place] [address], [place], [place] [address]  Protime-INR     Status: Abnormal   Collection Time: 08/05/22  8:41 PM  Result Value Ref Range   Prothrombin Time 15.7 (H) 11.4 - 15.2 seconds   INR 1.3 (H) 0.8 - 1.2    Comment: (NOTE) INR goal varies based on device and disease states. Performed at [facility], [place] [address] [place], [place] [address]   APTT     Status: Abnormal   Collection Time: 08/05/22  8:41 PM  Result Value Ref Range   aPTT 44 (H) 24 - 36 seconds    Comment:        IF BASELINE aPTT IS ELEVATED, SUGGEST PATIENT RISK ASSESSMENT BE USED TO DETERMINE APPROPRIATE ANTICOAGULANT THERAPY. Performed at [facility], [place] [address],  [place], [place] [address]   Blood Culture (routine x 2)     Status: None (Preliminary result)   Collection Time: 08/05/22  8:41 PM   Specimen: BLOOD RIGHT HAND  Result Value Ref Range  Specimen Description      BLOOD RIGHT HAND Performed at [facility], [place] [address] [place], [place] [address]    Special Requests      BOTTLES DRAWN AEROBIC AND ANAEROBIC Blood Culture results may not be optimal due to an inadequate volume of blood received in culture bottles Performed at [facility], [place] [address] [place], [place] [address]    Culture PENDING    Report Status PENDING   Urinalysis, Routine w reflex microscopic -Urine, Catheterized; Indwelling urinary catheter     Status: Abnormal   Collection Time: 08/05/22  8:41 PM  Result Value Ref Range   Color, Urine YELLOW YELLOW   APPearance CLOUDY (A) CLEAR   Specific Gravity, Urine 1.015 1.005 - 1.030   pH 6.0 5.0 - 8.0   Glucose, UA NEGATIVE NEGATIVE mg/dL   Hgb urine dipstick LARGE (A) NEGATIVE   Bilirubin Urine NEGATIVE NEGATIVE   Ketones, ur NEGATIVE NEGATIVE mg/dL   Protein, ur 100 (A) NEGATIVE mg/dL   Nitrite NEGATIVE NEGATIVE   Leukocytes,Ua LARGE (A) NEGATIVE   RBC / HPF >50 0 - 5 RBC/hpf   WBC, UA >50 0 - 5 WBC/hpf   Bacteria, UA MANY (A) NONE SEEN   Squamous Epithelial / HPF 0-5 0 - 5 /HPF   WBC Clumps PRESENT    Budding Yeast PRESENT    Crystals PRESENT (A) NEGATIVE    Comment: Performed at [facility], [place] [address], [place], [place] [address]  Lactic acid, plasma     Status: None   Collection Time: 08/05/22  9:00 PM  Result Value Ref Range   Lactic Acid, Venous 1.6 0.5 - 1.9 mmol/L    Comment: Performed at [facility], [place] [address], [place], [place] [address]   CT Head Wo Contrast  Result Date: 08/05/2022 CLINICAL DATA:  Unknown cause of mental status change EXAM: CT HEAD WITHOUT CONTRAST TECHNIQUE: Contiguous axial images were obtained from the base  of the skull through the vertex without intravenous contrast. RADIATION DOSE REDUCTION: This exam was performed according to the departmental dose-optimization program which includes automated exposure control, adjustment of the mA and/or kV according to patient size and/or use of iterative reconstruction technique. COMPARISON:  02/27/2022 FINDINGS: Brain: No intracranial hemorrhage, mass effect, or evidence of acute infarct. No hydrocephalus. No extra-axial fluid collection. Generalized cerebral atrophy. Ill-defined hypoattenuation within the cerebral white matter is nonspecific but consistent with chronic small vessel ischemic disease. Vascular: No hyperdense vessel. Intracranial arterial calcification. Skull: No fracture or focal lesion. Sinuses/Orbits: No acute finding. Paranasal sinuses and mastoid air cells are well aerated. Other: None. IMPRESSION: 1. No evidence of acute intracranial abnormality. 2. Chronic small vessel ischemic disease and cerebral atrophy. Electronically Signed   By: Placido Sou M.D.   On: 08/05/2022 22:42   DG Chest Port 1 View  Result Date: 08/05/2022 CLINICAL DATA:  Sepsis. EXAM: PORTABLE CHEST 1 VIEW COMPARISON:  X-ray 04/04/2022 and older FINDINGS: Underinflation. Film is rotated to the left. Slightly elevated left hemidiaphragm. Left basilar atelectasis. No separate consolidation, pneumothorax or effusion. No edema. Enlarged cardiopericardial silhouette with calcified aorta. Presumed skin fold shadow overlying the right hemithorax. Degenerative changes of the right shoulder IMPRESSION: Rotated radiograph with underinflation. Elevated left hemidiaphragm. Mild left basilar atelectasis. Enlarged heart Electronically Signed   By: Jill Side M.D.   On: 08/05/2022 19:38    Pending Labs [employer] (From admission, onward)     Start  Ordered   08/06/22 0500  CBC  Tomorrow morning,   R        08/06/22 0303   08/06/22 [id number]  Basic metabolic panel  Tomorrow morning,   R         08/06/22 0303   08/05/22 1924  Blood Culture (routine x 2)  (Septic presentation on arrival (screening labs, nursing and treatment orders for obvious sepsis))  BLOOD CULTURE X 2,   STAT      08/05/22 1925   08/05/22 1924  Remove and replace urinary cath (placed > 5 days) then obtain urine culture from new indwelling urinary catheter.  (Septic presentation on arrival (screening labs, nursing and treatment orders for obvious sepsis))  Once,   URGENT       Question:  Indication  Answer:  Sepsis   08/05/22 1925            Vitals/Pain Today's Vitals   08/05/22 1937 08/05/22 2130 08/05/22 2323 08/06/22 0033  BP:  136/61  (!) 138/59  Pulse:  (!) 59  (!) 55  Resp:  11  19  Temp: 99.3 F (37.4 C)  98.5 F (36.9 C)   TempSrc: Rectal  Oral   SpO2:  100%  100%  Weight:      Height:      PainSc:        Isolation Precautions No active isolations  Medications Medications  lactated ringers infusion ( Intravenous New Bag/Given 08/05/22 2111)  meropenem (MERREM) 1 g in sodium chloride 0.9 % 100 mL IVPB (has no administration in time range)  enoxaparin (LOVENOX) injection 40 mg (has no administration in time range)  acetaminophen (TYLENOL) tablet 650 mg (has no administration in time range)    Or  acetaminophen (TYLENOL) suppository 650 mg (has no administration in time range)  ondansetron (ZOFRAN) tablet 4 mg (has no administration in time range)    Or  ondansetron (ZOFRAN) injection 4 mg (has no administration in time range)  carbidopa-levodopa (SINEMET CR) 50-200 MG per tablet controlled release 1 tablet (has no administration in time range)  carbidopa-levodopa (SINEMET IR) 25-100 MG per tablet immediate release 2 tablet (has no administration in time range)    And  carbidopa-levodopa (SINEMET IR) 25-100 MG per tablet immediate release 1 tablet (has no administration in time range)  lactated ringers bolus 1,000 mL (0 mLs Intravenous Stopped 08/06/22 0155)  ceFEPIme (MAXIPIME) 2 g  in sodium chloride 0.9 % 100 mL IVPB (0 g Intravenous Stopped 08/05/22 2232)    Mobility non-ambulatory     Focused Assessments    R Recommendations: See Admitting Provider Note  Report given to:   Additional Notes:

## 2022-08-06 NOTE — H&P (Signed)
History and Physical    Patient: Theodore King [id number] DOB: 10/30/1945 DOA: 08/05/2022 DOS: the patient was seen and examined on 08/06/2022 PCP: [facility], [place]  Patient coming from: Home  Chief Complaint:  Chief Complaint  Patient presents with   Altered Mental Status   HPI: Theodore King is a 77 y.o. male with medical history significant of chronic foley catheter use, parkinson's dz, prior UTIs.  Pt most recently with CAUTI, sepsis, AKI, and bacteremia from UTI in Oct 2023.  UCx and BCx at that time grew out an MDRO Enterobacter as well as a pan sensitive enterococcus.  Pt in to ED today with 1 day h/o AMS, confusion, and foul smelling / appearing urine.  Per daughter's report to EDP: She reports that this morning was really when she noticed him not acting himself. She reports he was not as responsive as he normally was and he was leaning to the left and actually even slid out of his chair. He also ate very little. He goes to a program with [facility] during the day and she thought maybe it was just related to his Parkinson's and dementia and once he got to [facility] and had his medicine he would be okay. However when she went to get him today the nurse had the same concerns that he was not not his usual self and they were concerned. She also reports he has not eaten the rest of the day and he is normally a very good eater. She reports he recently had his Foley catheter changed but had noticed a little blood around the catheter site.   Pt unfortunately seems pretty confused and isnt really able to contribute much to history.  Review of Systems: unable to review all systems due to the inability of the patient to answer questions. 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   SOB (shortness of breath) 03/23/2018   Unspecified vitamin D deficiency    Unspecified vitamin D deficiency    Vertigo, benign paroxysmal 11/05/2016   Vitamin D deficiency 05/18/2013   Past Surgical History:  Procedure Laterality Date   APPENDECTOMY     COLONOSCOPY WITH PROPOFOL N/A 10/26/2021   Procedure: COLONOSCOPY WITH PROPOFOL;  Surgeon: Irene Shipper, MD;  Location: [facility];  Service: Gastroenterology;  Laterality: N/A;   POLYPECTOMY  10/26/2021   Procedure: POLYPECTOMY;  Surgeon: Irene Shipper, MD;  Location: [facility];  Service: Gastroenterology;;   Cherre Robins CUFF REPAIR     right   Social History:  reports that he quit smoking about 48 years ago. His smoking use included cigarettes. He started smoking about 69 years ago. He has a 10.00 pack-year smoking history. He has never used smokeless tobacco. He reports that he does not currently use alcohol. He reports that he does not use drugs.  No Known Allergies  Family History  Problem Relation Age of Onset   Cancer Mother    Cancer Father  Stroke Brother    Cancer Sister    Healthy Daughter    Cancer Sister    Lung cancer Brother    Healthy Daughter    Colon cancer Neg Hx    Esophageal cancer Neg Hx    Rectal cancer Neg Hx    Stomach cancer Neg Hx     Prior to Admission medications   Medication Sig Start Date End Date Taking? Authorizing Provider  acetaminophen (TYLENOL) 500 MG tablet Take 500 mg by mouth every 6 (six) hours as needed for mild pain.    [provider]  albuterol (VENTOLIN HFA) 108 (90 Base) MCG/ACT inhaler Inhale 2 puffs into the lungs every 4  (four) hours as needed for wheezing or shortness of breath.  10/03/18   [provider]  carbidopa-levodopa (SINEMET CR) 50-200 MG tablet Take 1 tablet by mouth at bedtime. 09/24/21   Penumalli, Earlean Polka, MD  carbidopa-levodopa (SINEMET IR) 25-100 MG tablet Take 2 tablets at 7 AM, 2 tablets at 10 AM, 2 tablets at 2 PM and 1 tablet at 6 PM Patient taking differently: Take 1-2 tablets by mouth See admin instructions. Take 2 tablets by mouth every morning and afternoon, take 1 tablet at bedtime 09/24/21   Penumalli, Earlean Polka, MD  fludrocortisone (FLORINEF) 0.1 MG tablet Take 0.1 mg by mouth daily.    [provider]  glycopyrrolate (ROBINUL) 1 MG tablet Take 1 tablet (1 mg total) by mouth 2 (two) times daily. 09/24/21   Penumalli, Earlean Polka, MD  sertraline (ZOLOFT) 25 MG tablet Take 1 tablet (25 mg total) by mouth daily. 09/24/21   Penumalli, Earlean Polka, MD  traZODone (DESYREL) 50 MG tablet Take 50 mg by mouth at bedtime. 08/17/20   [provider]    Physical Exam: Vitals:   08/05/22 1937 08/05/22 2130 08/05/22 2323 08/06/22 0033  BP:  136/61  (!) 138/59  Pulse:  (!) 59  (!) 55  Resp:  11  19  Temp: 99.3 F (37.4 C)  98.5 F (36.9 C)   TempSrc: Rectal  Oral   SpO2:  100%  100%  Weight:      Height:       Constitutional: NAD, calm, comfortable Respiratory: clear to auscultation bilaterally, no wheezing, no crackles. Normal respiratory effort. No accessory muscle use.  Cardiovascular: Regular rate and rhythm, no murmurs / rubs / gallops. No extremity edema. 2+ pedal pulses. No carotid bruits.  Abdomen: no tenderness, no masses palpated. No hepatosplenomegaly. Bowel sounds positive.  Neurologic: MAE, can follow some commands, speech unintelligible. Psychiatric: Calm, confused  Data Reviewed:    CT head = IMPRESSION: 1. No evidence of acute intracranial abnormality. 2. Chronic small vessel ischemic disease and cerebral atrophy.  COVID, FLU, RSV = negative.      Latest Ref Rng & Units 08/05/2022    8:41 PM 04/11/2022    5:09 AM 04/07/2022    5:16 AM  CBC  WBC 4.0 - 10.5 K/uL 10.8  5.3  6.3   Hemoglobin 13.0 - 17.0 g/dL 9.7  8.4  8.0   Hematocrit 39.0 - 52.0 % 31.2  26.7  25.8   Platelets 150 - 400 K/uL 187  196  155       Latest Ref Rng & Units 08/05/2022    8:41 PM 04/11/2022    5:09 AM 04/10/2022    6:46 AM  CMP  Glucose 70 - 99 mg/dL 142  90  80   BUN 8 - 23 mg/dL  $'26  23  26   'k$ Creatinine 0.61 - 1.24 mg/dL 1.66  1.44  1.82   Sodium 135 - 145 mmol/L 138  139  142   Potassium 3.5 - 5.1 mmol/L 3.5  3.7  3.9   Chloride 98 - 111 mmol/L 104  108  110   CO2 22 - 32 mmol/L '28  26  26   '$ Calcium 8.9 - 10.3 mg/dL 8.8  7.9  8.1   Total Protein 6.5 - 8.1 g/dL 7.4     Total Bilirubin 0.3 - 1.2 mg/dL 1.4     Alkaline Phos 38 - 126 U/L 60     AST 15 - 41 U/L 30     ALT 0 - 44 U/L 7      Lactate 1.5 and 1.6    Assessment and Plan: * Catheter-associated urinary tract infection (HCC) Pt with h/o MDRO Enterobacter Cloacae causing UTI and bacteremia in Oct, organism was resistant to zosyn, cefepime, and fortaz (see blood culture specifically) (? ESBL, didn't detect CTX-M ESBL gene on the assay in Oct but that resistance profile sure looks suspicious for an ESBL to me) Also had UTI and bacteremia with a pan-sensitive enterococcus at the same time. Now in with AMS similar to prior UTI symptoms as well as pyuria that is certainly suspicious for UTI. Empiric carbapenem for coverage of ESBL / MDRO given above HPI from October. IVF BCx and UCx pending Foley changed out  AKI (acute kidney injury) ([place]) Creat 1.6 today.  But looks like his baseline was 1.1 throughout most of last year.  Last we have on record was 1.44 at time of discharge from admission for CAUTI with bacteremia and sepsis. Probably pre-renal / ATN in setting of infection. IVF Repeat BMP in AM Strict intake and output  Acute metabolic encephalopathy Delirium secondary to UTI, h/o same in  past. Treat UTI as above Delirium precautions  Chronic indwelling Foley catheter Due to urinary retention. Foley changed out in ED with CAUTI diagnosis.  Parkinson's disease ([place]) Continue sinemet per current home regimen      Advance Care Planning:   Code Status: Full Code Full code previously during admits, no family at bedside.  Consults: None  Family Communication: No family in room  Severity of Illness: The appropriate patient status for this patient is OBSERVATION. Observation status is judged to be reasonable and necessary in order to provide the required intensity of service to ensure the patient's safety. The patient's presenting symptoms, physical exam findings, and initial radiographic and laboratory data in the context of their medical condition is felt to place them at decreased risk for further clinical deterioration. Furthermore, it is anticipated that the patient will be medically stable for discharge from the hospital within 2 midnights of admission.   Author: Etta Quill., DO 08/06/2022 3:26 AM  For on call review [email or web address].

## 2022-08-06 NOTE — Assessment & Plan Note (Addendum)
Delirium secondary to UTI, h/o same in past. Treat UTI as above Delirium precautions

## 2022-08-06 NOTE — Assessment & Plan Note (Signed)
Creat 1.6 today.  But looks like his baseline was 1.1 throughout most of last year.  Last we have on record was 1.44 at time of discharge from admission for CAUTI with bacteremia and sepsis. Probably pre-renal / ATN in setting of infection. IVF Repeat BMP in AM Strict intake and output

## 2022-08-06 NOTE — Plan of Care (Signed)
Problem: Clinical Measurements: Goal: Ability to maintain clinical measurements within normal limits will improve Outcome: Progressing   Problem: Nutrition: Goal: Adequate nutrition will be maintained Outcome: Progressing   Problem: Elimination: Goal: Will not experience complications related to bowel motility Outcome: [place], RN 08/06/22 9:35 AM

## 2022-08-06 NOTE — Assessment & Plan Note (Signed)
Due to urinary retention. Foley changed out in ED with CAUTI diagnosis.

## 2022-08-06 NOTE — TOC Initial Note (Addendum)
Transition of Care [facility]) - Initial/Assessment Note   Patient Details  Name: Theodore King MRN: [id number] Date of Birth: 03/17/1946  Transition of [facility]) CM/SW Contact:    Sherie Don, LCSW Phone Number: 08/06/2022, 11:46 AM  Clinical Narrative: [facility] consulted for discharge needs. At this time, none have been identified. TOC to follow for possible needs.  Addendum: CSW received call from Buddy Duty regarding LTC placement of the patient at a SNF. Ms. Thompson to follow up with [facility].  Expected Discharge Plan: Home/Self Care Barriers to Discharge: Continued Medical Work up  Expected Discharge Plan and Services In-house Referral: Clinical Social Work Living arrangements for the past 2 months: Single Family Home             DME Arranged: N/A DME Agency: NA  Prior Living Arrangements/Services Living arrangements for the past 2 months: [place] Patient language and need for interpreter reviewed:: Yes Need for Family Participation in Patient Care: No (Comment) Care giver support system in place?: Yes (comment) Criminal Activity/Legal Involvement Pertinent to Current Situation/Hospitalization: No - Comment as needed  Activities of Daily Living Home Assistive Devices/Equipment: None ADL Screening (condition at time of admission) Patient's cognitive ability adequate to safely complete daily activities?: No Is the patient deaf or have difficulty hearing?: No Does the patient have difficulty seeing, even when wearing glasses/contacts?: No Does the patient have difficulty concentrating, remembering, or making decisions?: Yes Patient able to express need for assistance with ADLs?: No Does the patient have difficulty dressing or bathing?: Yes Independently performs ADLs?: No Communication: Independent Dressing (OT): Needs assistance Is this a change from baseline?: Pre-admission baseline Grooming: Needs assistance Is this a change from baseline?: Pre-admission  baseline Feeding: Needs assistance Is this a change from baseline?: Pre-admission baseline Bathing: Needs assistance Is this a change from baseline?: Pre-admission baseline Toileting: Needs assistance Is this a change from baseline?: Pre-admission baseline In/Out Bed: Needs assistance Is this a change from baseline?: Pre-admission baseline Walks in Home: Needs assistance Is this a change from baseline?: Pre-admission baseline Does the patient have difficulty walking or climbing stairs?: Yes Weakness of Legs: Both Weakness of Arms/Hands: Both  Emotional Assessment Orientation: : Oriented to Self Alcohol / Substance Use: Not Applicable Psych Involvement: No (comment)  Admission diagnosis:  Catheter-associated urinary tract infection ([place]) [id number], N39.0] Altered mental status, unspecified altered mental status type [R41.82] Urinary tract infection associated with indwelling urethral catheter, initial encounter ([place]) [id number], N39.0] Patient Active Problem List   Diagnosis Date Noted   Catheter-associated urinary tract infection ([place]) 08/06/2022   Chronic indwelling Foley catheter    Sepsis ([place]) 04/04/2022   Pressure injury of skin 02/03/2022   Urinary retention [id number]   Acute metabolic encephalopathy [id number]   Foley catheter problem, initial encounter ([place]) 01/31/2022   Pulmonary nodule 01/31/2022   Syncope 11/18/2021   UTI (urinary tract infection) 11/12/2021   Essential hypertension 11/11/2021   Bacteremia 11/10/2021   Hypokalemia 11/09/2021   Normocytic anemia 11/09/2021   Benign neoplasm of transverse colon    Abnormal CT of the abdomen    AKI (acute kidney injury) ([place]) 10/23/2021   Thrombocytopenia ([place]) 10/23/2021   BPH (benign prostatic hyperplasia) 10/23/2021   Hyperbilirubinemia 10/23/2021   Colonic mass 10/23/2021   Aortic atherosclerosis ([place]) 10/23/2021   History of 2019 novel coronavirus disease (COVID-19) 04/24/2019   Dysphonia 10/16/2018    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   Former smoker 08/15/2018   SOB (shortness of breath) 03/23/2018  Vertigo, benign paroxysmal 11/05/2016   Abnormal dreams 09/06/2013   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   Hyperglycemia 05/18/2013   Paralysis agitans 05/18/2013   Vitamin D deficiency 05/18/2013   Hyperlipidemia 05/18/2013   Parkinson's disease ([place]) 10/26/2012   PCP:  [facility], [place]:   [id number] [id number] [id number]: [phone]: [address]- [address] [facility] [address]Phone: [phone]: [phone] Social Determinants of Health (SFaith Social History: SDOH Screenings   Tobacco Use: Medium Risk (08/05/2022)   SDOH Interventions:    Readmission Risk Interventions    04/08/2022    9:56 AM 04/07/2022    8:15 AM  Readmission Risk Prevention Plan  Transportation Screening Complete Complete  Medication Review ([place] Complete Complete  PCP or Specialist appointment within 3-5 days of discharge Complete Complete  HRI or Home Care Consult Complete Complete  SW Recovery Care/Counseling Consult Complete Complete  Palliative Care Screening  Not AFairchild AFBNot Applicable Not Applicable

## 2022-08-06 NOTE — Progress Notes (Signed)
PROGRESS NOTE  Brief Narrative: KYZIER ISGRO is a 77 y.o. male with a history of PD with dementia, BPH with chronic BOO with chronic foley catheter use and UTI (last complicated by polymicrobial Enterobacter cloacae and Enterococcus faecalis bacteremia in Oct 2023), stage IIIb CKD, HLD who presented from home by EMS for altered mental status, poor oral intake, found to have low grade fever at 99.83F, WBC 10.8k, lactic acid normal, pyuria and bacteriuria noted. Foley catheter was exchanged and the patient was diagnosed with CAUTI, treated with meropenem.  Subjective: Having no pain or other complaints. There was some bleeding around the urethral meatus earlier cleaned by RN.   Objective: BP (!) 178/75 (BP Location: Right Arm)   Pulse 62   Temp (!) 97.5 F (36.4 C) (Oral)   Resp 16   Ht '6\' 1"'$  (1.854 m)   Wt 77.2 kg   SpO2 100%   BMI 22.45 kg/m   Gen: Elderly frail male in no distress Pulm: Clear and nonlabored on room air, decreased at left base.  CV: RRR, no murmur, no JVD, no edema GI: Soft, NT, ND, +BS  Neuro: Alert and disoriented. Bradykinetic and rigid. Skin: No rashes, lesions or ulcers. Urethral meatus wnl.  Assessment & Plan: CAUTI: Previously discharged on levaquin and ampicillin per ID recommendations Oct 2023. E. faecalis was pansensitive, E. cloacae resistant to cephalosporins, zosyn; sensitive to FQ, penem, bactrim. - Received cefepime in ED, though agents in the past were resistant to cephalosporins.  - Continue meropenem pending urine and blood culture data.  BPH, BOO, chronic urinary retention:  - Continue chronic foley catheter (was exchanged 2/29).  - Monitor UOP (was had clogging in the past w/UTI)  Stage IIIb CKD: SCr currently around baseline 1.4-1.6.  - Avoid nephrotoxins as able, monitor UOP and BMP   Acute metabolic encephalopathy on chronic advanced PD with dementia, depression, insomnia: CT head nonacute with chronic small vessel ischemic disease  and atrophy.  - Continue sinemet - Continue home zoloft, trazodone.   Hyperbilirubinemia: Mild.  - Monitor and pursue work up if indicated  [name], MD Pager on [facility] 08/06/2022, 9:49 AM

## 2022-08-07 DIAGNOSIS — T83511A Infection and inflammatory reaction due to indwelling urethral catheter, initial encounter: Secondary | ICD-10-CM | POA: Diagnosis not present

## 2022-08-07 DIAGNOSIS — N390 Urinary tract infection, site not specified: Secondary | ICD-10-CM | POA: Diagnosis not present

## 2022-08-07 LAB — COMPREHENSIVE METABOLIC PANEL
ALT: 11 U/L (ref 0–44)
AST: 32 U/L (ref 15–41)
Albumin: 2.8 g/dL — ABNORMAL LOW (ref 3.5–5.0)
Alkaline Phosphatase: 64 U/L (ref 38–126)
Anion gap: 8 (ref 5–15)
BUN: 27 mg/dL — ABNORMAL HIGH (ref 8–23)
CO2: 27 mmol/L (ref 22–32)
Calcium: 8.7 mg/dL — ABNORMAL LOW (ref 8.9–10.3)
Chloride: 103 mmol/L (ref 98–111)
Creatinine, Ser: 1.39 mg/dL — ABNORMAL HIGH (ref 0.61–1.24)
GFR, Estimated: 52 mL/min — ABNORMAL LOW (ref >60)
Glucose, Bld: 106 mg/dL — ABNORMAL HIGH (ref 70–99)
Potassium: 3.9 mmol/L (ref 3.5–5.1)
Sodium: 138 mmol/L (ref 135–145)
Total Bilirubin: 0.9 mg/dL (ref 0.3–1.2)
Total Protein: 6.5 g/dL (ref 6.5–8.1)

## 2022-08-07 LAB — CBC
HCT: 28.8 % — ABNORMAL LOW (ref 39.0–52.0)
Hemoglobin: 9.2 g/dL — ABNORMAL LOW (ref 13.0–17.0)
MCH: 26.4 pg (ref 26.0–34.0)
MCHC: 31.9 g/dL (ref 30.0–36.0)
MCV: 82.8 fL (ref 80.0–100.0)
Platelets: 174 10*3/uL (ref 150–400)
RBC: 3.48 MIL/uL — ABNORMAL LOW (ref 4.22–5.81)
RDW: 13.2 % (ref 11.5–15.5)
WBC: 7.7 10*3/uL (ref 4.0–10.5)
nRBC: 0 % (ref 0.0–0.2)

## 2022-08-07 MED ORDER — NYSTATIN 100000 UNIT/GM EX POWD: 1.0000 | Freq: Three times a day (TID) | CUTANEOUS | Status: DC | PRN

## 2022-08-07 MED ORDER — ALBUTEROL SULFATE HFA 108 (90 BASE) MCG/ACT IN AERS: 2.0000 | INHALATION_SPRAY | RESPIRATORY_TRACT | Status: DC | PRN

## 2022-08-07 MED ORDER — SERTRALINE HCL 25 MG PO TABS
25.0000 mg | ORAL_TABLET | Freq: Every day | ORAL | Status: DC
  Administered 2022-08-07 – 2022-08-10 (×4): 25 mg via ORAL
  Filled 2022-08-07 (×4): qty 1

## 2022-08-07 MED ORDER — GLYCOPYRROLATE 1 MG PO TABS
1.0000 mg | ORAL_TABLET | Freq: Two times a day (BID) | ORAL | Status: DC
  Administered 2022-08-07 – 2022-08-10 (×7): 1 mg via ORAL
  Filled 2022-08-07 (×8): qty 1

## 2022-08-07 MED ORDER — TRAZODONE HCL 50 MG PO TABS
50.0000 mg | ORAL_TABLET | Freq: Every day | ORAL | Status: DC
  Administered 2022-08-07 – 2022-08-09 (×3): 50 mg via ORAL
  Filled 2022-08-07 (×3): qty 1

## 2022-08-07 NOTE — Progress Notes (Signed)
PROGRESS NOTE  Brief Narrative: Theodore King is a 77 y.o. male with a history of PD with dementia, BPH with chronic BOO with chronic foley catheter use and UTI (last complicated by polymicrobial Enterobacter cloacae and Enterococcus faecalis bacteremia in Oct 2023), stage IIIb CKD, HLD who presented from home by EMS for altered mental status, poor oral intake, found to have low grade fever at 99.11F, WBC 10.8k, lactic acid normal, pyuria and bacteriuria noted. Foley catheter was exchanged and the patient was diagnosed with CAUTI, treated with meropenem.  Subjective: Ate all of breakfast, no complaints.  Objective: BP (!) 164/70 (BP Location: Right Arm)   Pulse (!) 58   Temp 97.9 F (36.6 C)   Resp 15   Ht '6\' 1"'$  (1.854 m)   Wt 77.2 kg   SpO2 100%   BMI 22.45 kg/m   Gen: No distress Pulm: Clear, nonlabored  CV: RRR, no MRG, trace edema GI: Soft, NT, ND, +BS Neuro: Alert and oriented to person only. Bradykinetic without new focal deficits. Ext: Warm, no deformities Skin: No new rashes, lesions or ulcers on visualized skin   Assessment & Plan: CAUTI: Previously discharged on levaquin and ampicillin per ID recommendations Oct 2023. E. faecalis was pansensitive, E. cloacae resistant to cephalosporins, zosyn; sensitive to FQ, penem, bactrim. Leukocytosis has resolved. - Received cefepime in ED, though agents in the past were resistant to cephalosporins.  - Continue meropenem pending urine and blood culture data. Urine culture w/ K. pneumoniae. Narrow abx as able.   BPH, BOO, chronic urinary retention:  - Continue chronic foley catheter (was exchanged 2/29).  - Monitor UOP (has had clogging in the past w/UTI)  Stage IIIb CKD: SCr currently around baseline 1.4-1.6.  - Avoid nephrotoxins as able, monitor UOP and BMP   Acute metabolic encephalopathy on chronic advanced PD with dementia, depression, insomnia: CT head nonacute with chronic small vessel ischemic disease and atrophy.  -  Continue sinemet - Continue home zoloft, trazodone.   Hyperbilirubinemia: Mild, resolved. No further work up planned.  [name], MD Pager on amion 08/07/2022, 2:49 PM

## 2022-08-07 NOTE — Plan of Care (Signed)
Plan of care reviewed. 

## 2022-08-08 DIAGNOSIS — T83511A Infection and inflammatory reaction due to indwelling urethral catheter, initial encounter: Secondary | ICD-10-CM | POA: Diagnosis not present

## 2022-08-08 DIAGNOSIS — N390 Urinary tract infection, site not specified: Secondary | ICD-10-CM | POA: Diagnosis not present

## 2022-08-08 LAB — URINE CULTURE: Culture: >=100000 — AB

## 2022-08-08 MED ORDER — CEPHALEXIN 500 MG PO CAPS: 500.0000 mg | ORAL_CAPSULE | Freq: Two times a day (BID) | ORAL | 0 refills | Status: DC

## 2022-08-08 MED ORDER — CEPHALEXIN 500 MG PO CAPS
500.0000 mg | ORAL_CAPSULE | Freq: Two times a day (BID) | ORAL | Status: DC
  Administered 2022-08-08 – 2022-08-10 (×5): 500 mg via ORAL
  Filled 2022-08-08 (×5): qty 1

## 2022-08-08 NOTE — Progress Notes (Signed)
PROGRESS NOTE  Brief Narrative: Theodore King is a 77 y.o. male with a history of PD with dementia, BPH with chronic BOO with chronic foley catheter use and UTI (last complicated by polymicrobial Enterobacter cloacae and Enterococcus faecalis bacteremia in Oct 2023), stage IIIb CKD, HLD who presented from home by EMS for altered mental status, poor oral intake, found to have low grade fever at 99.72F, WBC 10.8k, lactic acid normal, pyuria and bacteriuria noted. Foley catheter was exchanged and the patient was diagnosed with CAUTI, treated with meropenem. Urine culture grew Klebsiella for which antibiotics were narrowed. He has grown quite deconditioned and will require SNF rehabilitation at discharge.  Subjective: Again no complaints, eating well, feels fine, denies pain. Required more assistance with getting up than his baseline and daughter unable to help consistently at home. SNF will be pursued.   Objective: BP (!) 150/63 (BP Location: Right Arm)   Pulse 64   Temp 98.6 F (37 C) (Oral)   Resp 15   Ht '6\' 1"'$  (1.854 m)   Wt 77.2 kg   SpO2 96%   BMI 22.45 kg/m   Elderly male in no distress having eaten all of his breakfast. Denies pain or other complaints. RRR, no MRG, no pitting edema Clear, nonlabored Soft abd, nontender throughout without distention. +BS. Light yellow urine in foley. Urethral meatus wnl.  Assessment & Plan: CAUTI: Previously discharged on levaquin and ampicillin per ID recommendations Oct 2023. E. faecalis was pansensitive, E. cloacae resistant to cephalosporins, zosyn; sensitive to FQ, penem, bactrim. Leukocytosis has resolved. - Received cefepime in ED. Continued meropenem pending urine and blood culture data. Urine culture w/ K. pneumoniae resistant only to ampicillin and macrobid. Will transition to keflex to complete a 10 day course. Blood cultures NGTD, leukocytosis promptly resolved, pt remains afebrile with stable exam.    BPH, BOO, chronic urinary  retention:  - Continue chronic foley catheter (was exchanged 2/29). UOP normal.   Stage IIIb CKD: SCr currently around baseline 1.4-1.6.    Acute metabolic encephalopathy on chronic advanced PD with dementia, depression, insomnia: CT head nonacute with chronic small vessel ischemic disease and atrophy.  - Continue home medications including sinemet, zoloft, trazodone.  - Patient has grown increasingly debilitated due to acute illness and will benefit from SNF level rehabilitation. TOC consulted. Patient is medically stable for discharge to safe venue.   Hyperbilirubinemia: Mild, resolved. No further work up planned.  [name], MD Pager on amion 08/08/2022, 2:06 PM

## 2022-08-08 NOTE — Evaluation (Signed)
Occupational Therapy Evaluation Patient Details Name: Theodore King MRN: [id number] DOB: Jun 16, 1945 Today's Date: 08/08/2022   History of Present Illness Pt is a 77 y.o. male who presented on 3/1 with AMS and foul smelling urine. Patient was admitted with catheter associated UTI, AKI, and acute metabolic encephalopathy. PMH significant for parkinson's disease, PNA due to COVID-19, vertigo, aortic atherosclerosis, Rt RTC repair, dementia   Clinical Impression   Patient is a 77 year old male who was admitted for above. Patient was living at home with daughter prior level. Currently, patient is +2 for bed mobility and attempted transfers with patient unable to advance feet with strong posterior lean.  Patient was noted to have decreased functional activity tolernace, decreased ROM, decreased BUE strength, decreased endurance, decreased sitting balance, decreased standing balanced, decreased safety awareness, and decreased knowledge of AE/AD impacting participation in ADLs. Patient would continue to benefit from skilled OT services at this time while admitted and after d/c to address noted deficits in order to improve overall safety and independence in ADLs.       Recommendations for follow up therapy are one component of a multi-disciplinary discharge planning process, led by the attending physician.  Recommendations may be updated based on patient status, additional functional criteria and insurance authorization.   Follow Up Recommendations  Skilled nursing-short term rehab (<3 hours/day)     Assistance Recommended at Discharge Frequent or constant Supervision/Assistance  Patient can return home with the following Two people to help with walking and/or transfers;Two people to help with bathing/dressing/bathroom;Direct supervision/assist for financial management;Help with stairs or ramp for entrance;Assist for transportation;Direct supervision/assist for medications management;Assistance with  cooking/housework    Functional Status Assessment  Patient has had a recent decline in their functional status and demonstrates the ability to make significant improvements in function in a reasonable and predictable amount of time.  Equipment Recommendations  None recommended by OT       Precautions / Restrictions Precautions Precautions: Fall Precaution Comments: foley Restrictions Weight Bearing Restrictions: No      Mobility Bed Mobility Overal bed mobility: Needs Assistance Bed Mobility: Supine to Sit, Sit to Supine     Supine to sit: Max assist Sit to supine: Total assist, +2 for physical assistance   General bed mobility comments: patient was max A for supine to sit with HOB raised and education on advancement of BLE. patient needed total A for posiitioning body in bed and to transition onto the bed pan.          Balance Overall balance assessment: Needs assistance Sitting-balance support: Bilateral upper extremity supported, Feet unsupported Sitting balance-Leahy Scale: Fair     Standing balance support: Bilateral upper extremity supported, Reliant on assistive device for balance Standing balance-Leahy Scale: Zero Standing balance comment: reliant on physical A to mainain balance.                           ADL either performed or assessed with clinical judgement   ADL Overall ADL's : Needs assistance/impaired Eating/Feeding: Set up;Supervision/ safety Eating/Feeding Details (indicate cue type and reason): patient was noted to have food tray in front of him upon exit of room with noted spillage on bed and hospial gown. Grooming: Wash/dry face;Minimal assistance;Sitting Grooming Details (indicate cue type and reason): with increased A To get food out of beard. patient reported he thinks it was jus from lunch before enterin room. Upper Body Bathing: Moderate assistance;Sitting   Lower Body Bathing:  Total assistance;Sitting/lateral leans   Upper  Body Dressing : Sitting;Moderate assistance   Lower Body Dressing: Sitting/lateral leans;Total assistance;Sit to/from stand Lower Body Dressing Details (indicate cue type and reason): patient unable to balance in standing with strong posterior leaning noted. Toilet Transfer: +2 for physical assistance;+2 for safety/equipment;Total assistance;Rolling walker (2 wheels) Toilet Transfer Details (indicate cue type and reason): patient had strong posterior lean and inability to weight shift to advance BLE. patient was minimally able to slide feet one cm. patient unable to correct balance with physical A patient transitioned to bed pan at this time. Toileting- Clothing Manipulation and Hygiene: Total assistance;Bed level               Pertinent Vitals/Pain Pain Assessment Pain Assessment: Faces Faces Pain Scale: Hurts a little bit Pain Location: reported low back pain Pain Descriptors / Indicators: Discomfort, Grimacing Pain Intervention(s): Limited activity within patient's tolerance, Monitored during session, Repositioned     Hand Dominance Left   Extremity/Trunk Assessment Upper Extremity Assessment Upper Extremity Assessment: Generalized weakness;Difficult to assess due to impaired cognition (patient was fixated on attempting to have BM during session)   Lower Extremity Assessment Lower Extremity Assessment: Defer to PT evaluation   Cervical / Trunk Assessment Cervical / Trunk Assessment: Kyphotic   Communication Communication Communication: No difficulties   Cognition Arousal/Alertness: Awake/alert Behavior During Therapy: Flat affect Overall Cognitive Status: Difficult to assess     General Comments: patient was oriented to self and hospital, patient able to provide some info that matched chart from previous admission. no family present to determine baseline. has h/o dementia.                Home Living Family/patient expects to be discharged to:: Private  residence Living Arrangements: Children       Additional Comments: daughter works during day. Pt goes to 2-3 days a week [facility] 8 hours/wk. (info above from previous admission)      Prior Functioning/Environment Prior Level of Function : Needs assist             Mobility Comments: patient reported being able to walk prior level with RW ADLs Comments: Assist with bathing, dressing assist; able to feed self and perform grooming--per previous notes. patient reported that he had help from his "friend: while his daughter went to work 2nd shift.        OT Problem List: Decreased strength;Impaired balance (sitting and/or standing);Decreased coordination;Decreased activity tolerance;Decreased safety awareness;Decreased knowledge of precautions;Decreased knowledge of use of DME or AE;Cardiopulmonary status limiting activity      OT Treatment/Interventions: Self-care/ADL training;Energy conservation;Therapeutic exercise;DME and/or AE instruction;Therapeutic activities;Patient/family education;Balance training    OT Goals(Current goals can be found in the care plan section) Acute Rehab OT Goals Patient Stated Goal: to go to bathroom OT Goal Formulation: Patient unable to participate in goal setting Time For Goal Achievement: 08/22/22 Potential to Achieve Goals: Fair  OT Frequency: Min 2X/week       AM-PAC OT "6 Clicks" Daily Activity     Outcome Measure Help from another person eating meals?: A Little Help from another person taking care of personal grooming?: A Little Help from another person toileting, which includes using toliet, bedpan, or urinal?: Total Help from another person bathing (including washing, rinsing, drying)?: Total Help from another person to put on and taking off regular upper body clothing?: A Lot Help from another person to put on and taking off regular lower body clothing?: Total 6 Click Score: 11   End  of Session Equipment Utilized During Treatment: Gait  belt;Rolling walker (2 wheels) Nurse Communication: Other (comment) (concerns over d/c home with unconfirmed assistance)  Activity Tolerance: Patient limited by fatigue Patient left: in bed;with call bell/phone within reach;with bed alarm set  OT Visit Diagnosis: Unsteadiness on feet (R26.81);Other abnormalities of gait and mobility (R26.89);Muscle weakness (generalized) (M62.81);Pain                Time: [id number] OT Time Calculation (min): 23 min Charges:  OT General Charges $OT Visit: 1 Visit OT Evaluation $OT Eval Moderate Complexity: 1 Mod OT Treatments $Self Care/Home Management : 8-22 mins  Rennie Plowman, MS Acute Rehabilitation Department Office# [phone]   Willa Rough 08/08/2022, 1:56 PM

## 2022-08-08 NOTE — Plan of Care (Signed)
  Problem: Pain Managment: Goal: General experience of comfort will improve Outcome: Progressing   Problem: Coping: Goal: Level of anxiety will decrease Outcome: Progressing   Problem: Safety: Goal: Ability to remain free from injury will improve Outcome: Progressing

## 2022-08-08 NOTE — Discharge Summary (Signed)
Physician Discharge Summary   Patient: Theodore King MRN: [id number] DOB: 20-Sep-1945  Admit date:     08/05/2022  Discharge date: 08/10/22  Discharge Physician: Theodore King   PCP: [facility]   Recommendations at discharge:  Follow up with PCP at [facility] Exchange foley per routine, note foley was replaced 2/29. Plan to complete CAUTI treatment with culture-tailored keflex (results outlined below). Continue routine neurology follow up with Theodore King.   Discharge Diagnoses: Principal Problem:   Catheter-associated urinary tract infection ([place]) Active Problems:   AKI (acute kidney injury) ([place])   Acute metabolic encephalopathy   Parkinson's disease ([place])   Chronic indwelling Foley catheter   Essential hypertension  Hospital Course: Theodore King is a 77 y.o. male with a history of PD with dementia, BPH with chronic BOO with chronic foley catheter use and UTI (last complicated by polymicrobial Enterobacter cloacae and Enterococcus faecalis bacteremia in Oct 2023), stage IIIb CKD, HLD who presented from home by EMS for altered mental status, poor oral intake, found to have low grade fever at 99.74F, WBC 10.8k, lactic acid normal, pyuria and bacteriuria noted. Foley catheter was exchanged and the patient was diagnosed with CAUTI, treated with meropenem. Urine culture grew Klebsiella for which antibiotics were narrowed. He has grown quite deconditioned and will require SNF rehabilitation at discharge.   Assessment and Plan: CAUTI: Previously discharged on levaquin and ampicillin per ID recommendations Oct 2023. E. faecalis was pansensitive, E. cloacae resistant to cephalosporins, zosyn; sensitive to FQ, penem, bactrim. Leukocytosis has resolved. - Received cefepime in ED. Continued meropenem pending urine and blood culture data. Urine culture w/ K. pneumoniae resistant only to ampicillin and macrobid. Transitioned to keflex to complete a 10 day course. Blood  cultures remain NGTD x4 days, leukocytosis promptly resolved, pt remains afebrile with stable exam.    BPH, BOO, chronic urinary retention:  - Continue chronic foley catheter (was exchanged 2/29). UOP normal.   Stage IIIb CKD: SCr currently around baseline 1.4-1.6.    HTN:  - Start hydralazine and monitor.    Acute metabolic encephalopathy on chronic advanced PD with dementia, depression, insomnia: CT head nonacute with chronic small vessel ischemic disease and atrophy.  - Continue home medications including sinemet, zoloft, trazodone.  - Patient has grown increasingly debilitated due to acute illness and will benefit from SNF level rehabilitation. TOC consulted. Patient is medically stable for discharge to safe venue.   Hyperbilirubinemia: Mild, resolved. No further work up planned.  Consultants: None Procedures performed: Foley exchange on day of admission  Disposition: Home Diet recommendation: Heart healthy DISCHARGE MEDICATION: Allergies as of 08/10/2022   No Known Allergies      Medication List     STOP taking these medications    fludrocortisone 0.1 MG tablet Commonly known as: FLORINEF       TAKE these medications    acetaminophen 500 MG tablet Commonly known as: TYLENOL Take 500 mg by mouth every 6 (six) hours as needed for mild pain.   albuterol 108 (90 Base) MCG/ACT inhaler Commonly known as: VENTOLIN HFA Inhale 2 puffs into the lungs every 4 (four) hours as needed for wheezing or shortness of breath.   carbidopa-levodopa 50-200 MG tablet Commonly known as: SINEMET CR Take 1 tablet by mouth at bedtime. What changed: Another medication with the same name was changed. Make sure you understand how and when to take each.   carbidopa-levodopa 25-100 MG tablet Commonly known as: SINEMET IR Take  2 tablets at 7 AM, 2 tablets at 10 AM, 2 tablets at 2 PM and 1 tablet at 6 PM What changed:  how much to take how to take this when to take this additional  instructions   cephALEXin 500 MG capsule Commonly known as: KEFLEX Take 1 capsule (500 mg total) by mouth every 12 (twelve) hours.   glycopyrrolate 1 MG tablet Commonly known as: Robinul Take 1 tablet (1 mg total) by mouth 2 (two) times daily.   nystatin powder Generic drug: nystatin Apply 1 Application topically 3 (three) times daily as needed (skin folds).   sertraline 25 MG tablet Commonly known as: ZOLOFT Take 1 tablet (25 mg total) by mouth daily.   traZODone 50 MG tablet Commonly known as: DESYREL Take 50 mg by mouth at bedtime.   Vitamin D3 1.25 MG (50000 UT) capsule Generic drug: Cholecalciferol Take 50,000 Units by mouth every 30 (thirty) days.        Theodore King, [place] Follow up.   Contact information: [place] [address] [id number]         Theodore King Follow up.   Specialty: Neurology Contact information: [place] [address] [phone]                Discharge Exam: Theodore King Weights   08/05/22 1851  Weight: 77.2 kg  BP (!) 166/66 (BP Location: Right Arm)   Pulse (!) 55   Temp 97.7 F (36.5 C)   Resp 18   Ht '6\' 1"'$  (1.854 m)   Wt 77.2 kg   SpO2 100%   BMI 22.45 kg/m   Elderly male in no distress having eaten all of his breakfast. Denies pain or other complaints. RRR, no MRG, no pitting edema Clear, nonlabored Soft abd, nontender throughout without distention. +BS. Light yellow urine in foley. Urethral meatus wnl.  Condition at discharge: stable  The results of significant diagnostics from this hospitalization (including imaging, microbiology, ancillary and laboratory) are listed below for reference.   Imaging Studies: CT Head Wo Contrast  Result Date: 08/05/2022 CLINICAL DATA:  Unknown cause of mental status change EXAM: CT HEAD WITHOUT CONTRAST TECHNIQUE: Contiguous axial images were obtained from the base of the  skull through the vertex without intravenous contrast. RADIATION DOSE REDUCTION: This exam was performed according to the departmental dose-optimization program which includes automated exposure control, adjustment of the mA and/or kV according to patient size and/or use of iterative reconstruction technique. COMPARISON:  02/27/2022 FINDINGS: Brain: No intracranial hemorrhage, mass effect, or evidence of acute infarct. No hydrocephalus. No extra-axial fluid collection. Generalized cerebral atrophy. Ill-defined hypoattenuation within the cerebral white matter is nonspecific but consistent with chronic small vessel ischemic disease. Vascular: No hyperdense vessel. Intracranial arterial calcification. Skull: No fracture or focal lesion. Sinuses/Orbits: No acute finding. Paranasal sinuses and mastoid air cells are well aerated. Other: None. IMPRESSION: 1. No evidence of acute intracranial abnormality. 2. Chronic small vessel ischemic disease and cerebral atrophy. Electronically Signed   By: Placido Sou M.D.   On: 08/05/2022 22:42   DG Chest Port 1 View  Result Date: 08/05/2022 CLINICAL DATA:  Sepsis. EXAM: PORTABLE CHEST 1 VIEW COMPARISON:  X-ray 04/04/2022 and older FINDINGS: Underinflation. Film is rotated to the left. Slightly elevated left hemidiaphragm. Left basilar atelectasis. No separate consolidation, pneumothorax or effusion. No edema. Enlarged cardiopericardial silhouette with calcified aorta. Presumed skin fold shadow  overlying the right hemithorax. Degenerative changes of the right shoulder IMPRESSION: Rotated radiograph with underinflation. Elevated left hemidiaphragm. Mild left basilar atelectasis. Enlarged heart Electronically Signed   By: Jill Side M.D.   On: 08/05/2022 19:38    Microbiology: Results for orders placed or performed during the hospital encounter of 08/05/22  Resp panel by RT-PCR (RSV, Flu A&B, Covid) Anterior Nasal Swab     Status: None   Collection Time: 08/05/22  8:11 PM    Specimen: Anterior Nasal Swab  Result Value Ref Range Status   SARS Coronavirus 2 by RT PCR NEGATIVE NEGATIVE Final    Comment: (NOTE) SARS-CoV-2 target nucleic acids are NOT DETECTED.  The SARS-CoV-2 RNA is generally detectable in upper respiratory specimens during the acute phase of infection. The lowest concentration of SARS-CoV-2 viral copies this assay can detect is 138 copies/mL. A negative result does not preclude SARS-Cov-2 infection and should not be used as the sole basis for treatment or other patient management decisions. A negative result may occur with  improper specimen collection/handling, submission of specimen other than nasopharyngeal swab, presence of viral mutation(s) within the areas targeted by this assay, and inadequate number of viral copies(<138 copies/mL). A negative result must be combined with clinical observations, patient history, and epidemiological information. The expected result is Negative.  Fact Sheet for Patients:  EntrepreneurPulse.com.au  Fact Sheet for Healthcare Providers:  IncredibleEmployment.be  This test is no t yet approved or cleared by the [place] FDA and  has been authorized for detection and/or diagnosis of SARS-CoV-2 by FDA under an Emergency Use Authorization (EUA). This EUA will remain  in effect (meaning this test can be used) for the duration of the COVID-19 declaration under Section 564(b)(1) of the Act, 21 U.S.C.section 360bbb-3(b)(1), unless the authorization is terminated  or revoked sooner.       Influenza A by PCR NEGATIVE NEGATIVE Final   Influenza B by PCR NEGATIVE NEGATIVE Final    Comment: (NOTE) The Xpert Xpress SARS-CoV-2/FLU/RSV plus assay is intended as an aid in the diagnosis of influenza from Nasopharyngeal swab specimens and should not be used as a sole basis for treatment. Nasal washings and aspirates are unacceptable for Xpert Xpress  SARS-CoV-2/FLU/RSV testing.  Fact Sheet for Patients: EntrepreneurPulse.com.au  Fact Sheet for Healthcare Providers: IncredibleEmployment.be  This test is not yet approved or cleared by the [place] FDA and has been authorized for detection and/or diagnosis of SARS-CoV-2 by FDA under an Emergency Use Authorization (EUA). This EUA will remain in effect (meaning this test can be used) for the duration of the COVID-19 declaration under Section 564(b)(1) of the Act, 21 U.S.C. section 360bbb-3(b)(1), unless the authorization is terminated or revoked.     Resp Syncytial Virus by PCR NEGATIVE NEGATIVE Final    Comment: (NOTE) Fact Sheet for Patients: EntrepreneurPulse.com.au  Fact Sheet for Healthcare Providers: IncredibleEmployment.be  This test is not yet approved or cleared by the [place] FDA and has been authorized for detection and/or diagnosis of SARS-CoV-2 by FDA under an Emergency Use Authorization (EUA). This EUA will remain in effect (meaning this test can be used) for the duration of the COVID-19 declaration under Section 564(b)(1) of the Act, 21 U.S.C. section 360bbb-3(b)(1), unless the authorization is terminated or revoked.  Performed at [facility] , [place] [address], [place], [place] [address]   Blood Culture (routine x 2)     Status: None   Collection Time: 08/05/22  8:35 PM   Specimen: BLOOD  Result Value Ref Range Status   Specimen Description   Final    BLOOD LEFT ANTECUBITAL Performed at [facility] [address] [place], [place] [address]    Special Requests   Final    BOTTLES DRAWN AEROBIC AND ANAEROBIC Blood Culture results may not be optimal due to an inadequate volume of blood received in culture bottles Performed at [facility] [address], [place], [place] [address]    Culture   Final    NO GROWTH 5  DAYS Performed at [facility], [place] [address], [place], [place] [address]    Report Status 08/10/2022 FINAL  Final  Blood Culture (routine x 2)     Status: None   Collection Time: 08/05/22  8:41 PM   Specimen: BLOOD RIGHT HAND  Result Value Ref Range Status   Specimen Description   Final    BLOOD RIGHT HAND Performed at [facility], [place] [address] [place], [place] [address]    Special Requests   Final    BOTTLES DRAWN AEROBIC AND ANAEROBIC Blood Culture results may not be optimal due to an inadequate volume of blood received in culture bottles Performed at [facility] [address] [place], [place] [address]    Culture   Final    NO GROWTH 5 DAYS Performed at [facility], [place] [address] [place], [place] [address]    Report Status 08/10/2022 FINAL  Final  Remove and replace urinary cath (placed > 5 days) then obtain urine culture from new indwelling urinary catheter.     Status: Abnormal   Collection Time: 08/05/22  8:41 PM   Specimen: Urine, Catheterized  Result Value Ref Range Status   Specimen Description   Final    URINE, CATHETERIZED Performed at [facility] [address], [place], [place] [address]    Special Requests   Final    NONE Performed at [facility], [place] [address] [place], [place] [address]    Culture >=100,000 COLONIES/mL KLEBSIELLA PNEUMONIAE (A)  Final   Report Status 08/08/2022 FINAL  Final   Organism ID, Bacteria KLEBSIELLA PNEUMONIAE (A)  Final      Susceptibility   Klebsiella pneumoniae - MIC*    AMPICILLIN >=32 RESISTANT Resistant     CEFAZOLIN <=4 SENSITIVE Sensitive     CEFEPIME <=0.12 SENSITIVE Sensitive     CEFTRIAXONE <=0.25 SENSITIVE Sensitive     CIPROFLOXACIN <=0.25 SENSITIVE Sensitive     GENTAMICIN <=1 SENSITIVE Sensitive     IMIPENEM 0.5 SENSITIVE Sensitive     NITROFURANTOIN 128 RESISTANT Resistant     TRIMETH/SULFA <=20 SENSITIVE Sensitive      AMPICILLIN/SULBACTAM 8 SENSITIVE Sensitive     PIP/TAZO <=4 SENSITIVE Sensitive     * >=100,000 COLONIES/mL KLEBSIELLA PNEUMONIAE    Labs: CBC: Recent Labs  Lab 08/05/22 2041 08/06/22 0706 08/07/22 0302  WBC 10.8* 8.6 7.7  NEUTROABS 8.8*  --   --   HGB 9.7* 9.9* 9.2*  HCT 31.2* 31.4* 28.8*  MCV 83.4 83.1 82.8  PLT 187 151 [id number]   Basic Metabolic Panel: Recent Labs  Lab 08/05/22 2041 08/06/22 0706 08/07/22 0302  NA 138 138 138  K 3.5 3.5 3.9  CL 104 106 103  CO2 '28 27 27  '$ GLUCOSE 142* 137* 106*  BUN 26* 25* 27*  CREATININE 1.66* 1.47* 1.39*  CALCIUM 8.8* 8.7* 8.7*   Liver Function Tests: Recent Labs  Lab 08/05/22 2041 08/07/22 0302  AST 30 32  ALT 7 11  ALKPHOS 60 64  BILITOT 1.4* 0.9  PROT 7.4 6.5  ALBUMIN 3.2* 2.8*   CBG: Recent Labs  Lab 08/05/22 1851  GLUCAP 113*    Discharge time spent: greater than 30 minutes.  Signed: [name], MD [facility] 08/10/2022

## 2022-08-09 DIAGNOSIS — N390 Urinary tract infection, site not specified: Secondary | ICD-10-CM | POA: Diagnosis not present

## 2022-08-09 DIAGNOSIS — T83511A Infection and inflammatory reaction due to indwelling urethral catheter, initial encounter: Secondary | ICD-10-CM | POA: Diagnosis not present

## 2022-08-09 MED ORDER — HYDRALAZINE HCL 25 MG PO TABS: 25.0000 mg | ORAL_TABLET | Freq: Four times a day (QID) | ORAL | Status: DC | PRN

## 2022-08-09 MED ORDER — HYDRALAZINE HCL 25 MG PO TABS
25.0000 mg | ORAL_TABLET | Freq: Four times a day (QID) | ORAL | Status: DC
  Administered 2022-08-09: 25 mg via ORAL
  Filled 2022-08-09 (×2): qty 1

## 2022-08-09 NOTE — Progress Notes (Addendum)
PROGRESS NOTE  Brief Narrative: Theodore King is a 77 y.o. male with a history of PD with dementia, BPH with chronic BOO with chronic foley catheter use and UTI (last complicated by polymicrobial Enterobacter cloacae and Enterococcus faecalis bacteremia in Oct 2023), stage IIIb CKD, HLD who presented from home by EMS for altered mental status, poor oral intake, found to have low grade fever at 99.19F, WBC 10.8k, lactic acid normal, pyuria and bacteriuria noted. Foley catheter was exchanged and the patient was diagnosed with CAUTI, treated with meropenem. Urine culture grew Klebsiella for which antibiotics were narrowed. He has grown quite deconditioned and will require SNF rehabilitation at discharge.  Subjective: No complaints at all except that he's hungry and wants breakfast.  Objective: BP (!) 168/64 (BP Location: Right Arm)   Pulse (!) 56   Temp 97.8 F (36.6 C) (Oral)   Resp 16   Ht '6\' 1"'$  (1.854 m)   Wt 77.2 kg   SpO2 100%   BMI 22.45 kg/m   Elderly male in no distress Clear, nonlabored Soft, NT, ND, +BS No new focal deficits, interactive and pleasantly confused.  Assessment & Plan: CAUTI: Previously discharged on levaquin and ampicillin per ID recommendations Oct 2023. E. faecalis was pansensitive, E. cloacae resistant to cephalosporins, zosyn; sensitive to FQ, penem, bactrim. Leukocytosis has resolved. - Received cefepime in ED. Continued meropenem pending urine and blood culture data. Urine culture w/ K. pneumoniae resistant only to ampicillin and macrobid. Transitioned to keflex to complete a 10 day course. Blood cultures remain NGTD x4 days, leukocytosis promptly resolved, pt remains afebrile with stable exam.    BPH, BOO, chronic urinary retention:  - Continue chronic foley catheter (was exchanged 2/29). UOP normal.   Stage IIIb CKD: SCr currently around baseline 1.4-1.6.    HTN:  - Start hydralazine and monitor.   Acute metabolic encephalopathy on chronic advanced  PD with dementia, depression, insomnia: CT head nonacute with chronic small vessel ischemic disease and atrophy.  - Continue home medications including sinemet, zoloft, trazodone.  - Patient has grown increasingly debilitated due to acute illness and will benefit from SNF level rehabilitation. TOC consulted. Patient is medically stable for discharge to safe venue.   Hyperbilirubinemia: Mild, resolved. No further work up planned.  [name], MD Pager on amion 08/09/2022, 11:04 AM

## 2022-08-09 NOTE — Evaluation (Signed)
Physical Therapy Evaluation Patient Details Name: Theodore King MRN: [id number] DOB: 05/08/1946 Today's Date: 08/09/2022  History of Present Illness  Pt is a 77 y.o. male who presented on 3/1 with AMS and foul smelling urine. Patient was admitted with catheter associated UTI, AKI, and acute metabolic encephalopathy. PMH significant for parkinson's disease, PNA due to COVID-19, vertigo, aortic atherosclerosis, Rt RTC repair, dementia  Clinical Impression  Pt admitted with above diagnosis.  Pt pleasant and cooperative, unsure of baseline--pt reports amb with RW;  pt was able to stand with min assist with multi-modal cues, support of recliner back to stand. Requires incr time and demonstrates significant dyspraxia with basic tasks; will benefit from SNF. PT will follow in acute setting   Pt currently with functional limitations due to the deficits listed below (see PT Problem List). Pt will benefit from skilled PT to increase their independence and safety with mobility to allow discharge to the venue listed below.          Recommendations for follow up therapy are one component of a multi-disciplinary discharge planning process, led by the attending physician.  Recommendations may be updated based on patient status, additional functional criteria and insurance authorization.  Follow Up Recommendations Skilled nursing-short term rehab (<3 hours/day) Can patient physically be transported by private vehicle: No    Assistance Recommended at Discharge Frequent or constant Supervision/Assistance  Patient can return home with the following  A lot of help with walking and/or transfers;A lot of help with bathing/dressing/bathroom;Assist for transportation;Help with stairs or ramp for entrance;Assistance with cooking/housework;Direct supervision/assist for medications management;Direct supervision/assist for financial management    Equipment Recommendations None recommended by PT  Recommendations for  Other Services       Functional Status Assessment Patient has had a recent decline in their functional status and demonstrates the ability to make significant improvements in function in a reasonable and predictable amount of time.     Precautions / Restrictions Precautions Precautions: Fall Restrictions Weight Bearing Restrictions: No      Mobility  Bed Mobility Overal bed mobility: Needs Assistance Bed Mobility: Supine to Sit, Sit to Supine     Supine to sit: Mod assist, HOB elevated Sit to supine: Mod assist   General bed mobility comments: assist to progress LEs on and off bed; incr time    Transfers Overall transfer level: Needs assistance Equipment used: Rolling walker (2 wheels) Transfers: Sit to/from Stand Sit to Stand: Min assist           General transfer comment: attempted standing with RW, pt unable. with recliner back placed in from of pt he was able to come to full stand with multi-modal cues and min assist;    Ambulation/Gait               General Gait Details: NT with +1 assist; able to take lateral steps along EOB with support of recliner back, multi-modal cues and assist to steady with wt shifting  Stairs            Wheelchair Mobility    Modified Rankin (Stroke Patients Only)       Balance Overall balance assessment: Needs assistance Sitting-balance support: No upper extremity supported, Feet supported Sitting balance-Leahy Scale: Fair     Standing balance support: Bilateral upper extremity supported, Reliant on assistive device for balance Standing balance-Leahy Scale: Poor Standing balance comment: able to take lateral steps with support of recliner back, min assist to steady  Pertinent Vitals/Pain Pain Assessment Pain Assessment: Faces Faces Pain Scale: Hurts a little bit Pain Location: mid abd pain Pain Descriptors / Indicators: Tender Pain Intervention(s): Limited activity  within patient's tolerance, Monitored during session    Home Living Family/patient expects to be discharged to:: Skilled nursing facility Living Arrangements: Children Available Help at Discharge: Family;Available PRN/intermittently Type of Home: House Home Access: Stairs to enter   Entrance Stairs-Number of Steps: 3   Home Layout: One level Home Equipment: Conservation officer, nature (2 wheels);Cane - single point;Grab bars - tub/shower;BSC/3in1 Additional Comments: daughter works during day. Pt [facility] hours/wk[facility] (info above from previous admission)    Prior Function Prior Level of Function : Needs assist             Mobility Comments: patient reported being able to walk prior level with RW       Hand Dominance        Extremity/Trunk Assessment   Upper Extremity Assessment Upper Extremity Assessment: Defer to OT evaluation    Lower Extremity Assessment Lower Extremity Assessment: Generalized weakness       Communication   Communication: No difficulties  Cognition Arousal/Alertness: Awake/alert Behavior During Therapy: Flat affect Overall Cognitive Status: History of cognitive impairments - at baseline Area of Impairment: Following commands, Problem solving                       Following Commands: Follows one step commands with increased time, Follows multi-step commands inconsistently     Problem Solving: Slow processing, Difficulty sequencing, Requires verbal cues, Requires tactile cues General Comments: hx of dementia, able to answer some PLOF questions however states he lives with his sister which does not align with info from previous adm; oriented to self and place        General Comments      Exercises     Assessment/Plan    PT Assessment Patient needs continued PT services  PT Problem List Decreased strength;Decreased activity tolerance;Decreased balance;Decreased mobility;Decreased cognition;Decreased coordination        PT Treatment Interventions DME instruction;Therapeutic exercise;Gait training;Functional mobility training;Therapeutic activities;Patient/family education;Balance training    PT Goals (Current goals can be found in the Care Plan section)  Acute Rehab PT Goals PT Goal Formulation: Patient unable to participate in goal setting Time For Goal Achievement: 08/23/22 Potential to Achieve Goals: Fair    Frequency Min 2X/week     Co-evaluation               AM-PAC PT "6 Clicks" Mobility  Outcome Measure Help needed turning from your back to your side while in a flat bed without using bedrails?: A Lot Help needed moving from lying on your back to sitting on the side of a flat bed without using bedrails?: A Lot Help needed moving to and from a bed to a chair (including a wheelchair)?: Total Help needed standing up from a chair using your arms (e.g., wheelchair or bedside chair)?: A Lot Help needed to walk in hospital room?: Total Help needed climbing 3-5 steps with a railing? : Total 6 Click Score: 9    End of Session Equipment Utilized During Treatment: Gait belt Activity Tolerance: Patient tolerated treatment well Patient left: in bed;with call bell/phone within reach;with bed alarm set   PT Visit Diagnosis: Other abnormalities of gait and mobility (R26.89);Difficulty in walking, not elsewhere classified (R26.2);Muscle weakness (generalized) (M62.81)    Time: [id number] PT Time Calculation (min) (ACUTE ONLY):  19 min   Charges:   PT Evaluation $PT Eval Low Complexity: [place], PT  Acute Rehab Dept [facility]) [phone]  [facility] Weekend Pager [facility] only)  [phone]  08/09/2022   [facility] 08/09/2022, 12:07 PM

## 2022-08-09 NOTE — Care Management Important Message (Signed)
Important Message  Patient Details IM Letter given. Name: Theodore King MRN: [id number] Date of Birth: 1945-11-01   Medicare Important Message Given:  Yes     Kerin Salen 08/09/2022, 1:35 PM

## 2022-08-09 NOTE — NC FL2 (Signed)
[place] LEVEL OF CARE FORM     IDENTIFICATION  Patient Name: Theodore King Birthdate: 01/30/1946 Sex: male Admission Date (Current Location): 08/05/2022  [facility] and [place] Number:  Herbalist and Address:  [facility],  [address] [place], [place]      Provider Number: [id number]  Attending Physician Name and Address:  [name], MD  Relative Name and Phone Number:  Biff Charon (daughter) Ph: [phone]    Current Level of Care: Hospital Recommended Level of Care: [place] Prior Approval Number:    Date Approved/Denied:   PASRR Number: [id number]  Discharge Plan: SNF    Current Diagnoses: Patient Active Problem List   Diagnosis Date Noted   Catheter-associated urinary tract infection ([place]) 08/06/2022   Chronic indwelling Foley catheter    Sepsis ([place]) 04/04/2022   Pressure injury of skin 02/03/2022   Urinary retention [id number]   Acute metabolic encephalopathy [id number]   Foley catheter problem, initial encounter ([place]) 01/31/2022   Pulmonary nodule 01/31/2022   Syncope 11/18/2021   UTI (urinary tract infection) 11/12/2021   Essential hypertension 11/11/2021   Bacteremia 11/10/2021   Hypokalemia 11/09/2021   Normocytic anemia 11/09/2021   Benign neoplasm of transverse colon    Abnormal CT of the abdomen    AKI (acute kidney injury) ([place]) 10/23/2021   Thrombocytopenia ([place]) 10/23/2021   BPH (benign prostatic hyperplasia) 10/23/2021   Hyperbilirubinemia 10/23/2021   Colonic mass 10/23/2021   Aortic atherosclerosis ([place]) 10/23/2021   History of 2019 novel coronavirus disease (COVID-19) 04/24/2019   Dysphonia 10/16/2018   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   Former smoker 08/15/2018   SOB (shortness of breath) 03/23/2018   Vertigo, benign paroxysmal 11/05/2016   Abnormal dreams 09/06/2013   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   Hyperglycemia 05/18/2013    Paralysis agitans 05/18/2013   Vitamin D deficiency 05/18/2013   Hyperlipidemia 05/18/2013   Parkinson's disease ([place]) 10/26/2012    Orientation RESPIRATION BLADDER Height & Weight     Self, Place  Normal Continent Weight: 170 lb 3.1 oz (77.2 kg) Height:  '6\' 1"'$  (185.4 cm)  BEHAVIORAL SYMPTOMS/MOOD NEUROLOGICAL BOWEL NUTRITION STATUS     (N/A) Continent Diet (Heart healthy diet)  AMBULATORY STATUS COMMUNICATION OF NEEDS Skin   Extensive Assist Verbally Normal                       Personal Care Assistance Level of Assistance  Bathing, Feeding, Dressing Bathing Assistance: Maximum assistance Feeding assistance: Limited assistance Dressing Assistance: Maximum assistance     Functional Limitations Info  Sight, Hearing, Speech Sight Info: Impaired Hearing Info: Adequate Speech Info: Adequate    SPECIAL CARE FACTORS FREQUENCY  PT (By licensed PT), OT (By licensed OT)     PT Frequency: 5x's/week OT Frequency: 5x's/week            Contractures Contractures Info: Not present    Additional Factors Info  Code Status, Allergies, Psychotropic Code Status Info: Full Allergies Info: NKa Psychotropic Info: Zoloft, Desyrel         Current Medications (08/09/2022):  This is the current hospital active medication list Current Facility-Administered Medications  Medication Dose Route Frequency Provider Last Rate Last Admin   acetaminophen (TYLENOL) tablet 650 mg  650 mg Oral Q6H PRN Etta Quill, DO       Or   acetaminophen (TYLENOL) suppository 650 mg  650 mg Rectal Q6H PRN Alcario Drought,  Toy Care, DO       albuterol (VENTOLIN HFA) 108 (90 Base) MCG/ACT inhaler 2 puff  2 puff Inhalation Q4H PRN [name], MD       carbidopa-levodopa (SINEMET CR) 50-200 MG per tablet controlled release 1 tablet  1 tablet Oral QHS Etta Quill, DO   1 tablet at 08/08/22 2241   carbidopa-levodopa (SINEMET IR) 25-100 MG per tablet immediate release 2 tablet  2 tablet Oral BID AC  Etta Quill, DO   2 tablet at 08/09/22 [id number]   And   carbidopa-levodopa (SINEMET IR) 25-100 MG per tablet immediate release 1 tablet  1 tablet Oral QHS Etta Quill, DO   1 tablet at 08/08/22 2240   cephALEXin (KEFLEX) capsule 500 mg  500 mg Oral Q12H [name], MD   500 mg at 08/09/22 1028   Chlorhexidine Gluconate Cloth 2 % PADS 6 each  6 each Topical Daily [name], MD   6 each at 08/09/22 1028   enoxaparin (LOVENOX) injection 40 mg  40 mg Subcutaneous Q24H Jennette Kettle M, DO   40 mg at 08/08/22 1404   glycopyrrolate (ROBINUL) tablet 1 mg  1 mg Oral BID [name], MD   1 mg at 08/09/22 1028   hydrALAZINE (APRESOLINE) tablet 25 mg  25 mg Oral Q6H [name], MD   25 mg at 08/09/22 0841   nystatin (MYCOSTATIN/NYSTOP) topical powder 1 Application  1 Application Topical TID PRN [name], MD       ondansetron [facility]) tablet 4 mg  4 mg Oral Q6H PRN Etta Quill, DO       Or   ondansetron [facility]) injection 4 mg  4 mg Intravenous Q6H PRN Etta Quill, DO       sertraline (ZOLOFT) tablet 25 mg  25 mg Oral Daily [name], MD   25 mg at 08/09/22 1028   traZODone (DESYREL) tablet 50 mg  50 mg Oral QHS [name], MD   50 mg at 08/08/22 2240     Discharge Medications: Please see discharge summary for a list of discharge medications.  Relevant Imaging Results:  Relevant Lab Results:   Additional Information SSN: [id number]  Sherie Don, LCSW

## 2022-08-09 NOTE — TOC Progression Note (Addendum)
Transition of Care [facility]) - Progression Note   Patient Details  Name: Theodore King MRN: [id number] Date of Birth: May 29, 1946  Transition of Care [facility]) CM/SW [place], LCSW Phone Number: 08/09/2022, 12:44 PM  Clinical Narrative: PT and OT evaluations have recommended SNF. CSW spoke with patient's daughter/HCPOA, Lannette Donath, regarding recommendation. Daughter is agreeable and reported she has been working with [facility] on placement for the patient. CSW spoke with Olivia Mackie with [facility] regarding SNF recommendation. Olivia Mackie requested [facility] as [facility] has been working with Perrin Smack in admissions for placement at the facility. FL2 done; PASRR confirmed. Initial referral faxed to [facility] in the hub. TOC awaiting bed confirmation and insurance authorization.  Addendum: 2:42pm-CSW received call from [facility] with [facility] and the facility can accept the patient tomorrow pending insurance approval/paperwork from [facility]. CSW notified Olivia Mackie with [facility] that patient can be admitted tomorrow. Hospitalist updated.  Expected Discharge Plan: [place] Barriers to Discharge: SNF Pending bed offer, Insurance Authorization  Expected Discharge Plan and Services In-house Referral: Clinical Social Work Post Acute Care Choice: [place] Living arrangements for the past 2 months: [place] Expected Discharge Date: 08/09/22               DME Arranged: N/A DME Agency: NA  Social Determinants of Health ([place]) Interventions SDOH Screenings   Tobacco Use: Medium Risk (08/05/2022)   Readmission Risk Interventions    04/08/2022    9:56 AM 04/07/2022    8:15 AM  Readmission Risk Prevention Plan  Transportation Screening Complete Complete  Medication Review (RN Care Manager) Complete Complete  PCP or Specialist appointment within 3-5 days of discharge Complete Complete  HRI or Home Care Consult Complete Complete  SW Recovery Care/Counseling Consult  Complete Complete  Palliative Care Screening  Not [place] Not Applicable Not Applicable

## 2022-08-09 NOTE — Plan of Care (Signed)
?  Problem: Coping: ?Goal: Level of anxiety will decrease ?Outcome: Progressing ?  ?Problem: Safety: ?Goal: Ability to remain free from injury will improve ?Outcome: Progressing ?  ?

## 2022-08-10 DIAGNOSIS — N390 Urinary tract infection, site not specified: Secondary | ICD-10-CM | POA: Diagnosis not present

## 2022-08-10 DIAGNOSIS — T83511A Infection and inflammatory reaction due to indwelling urethral catheter, initial encounter: Secondary | ICD-10-CM | POA: Diagnosis not present

## 2022-08-10 LAB — CULTURE, BLOOD (ROUTINE X 2)
Culture: NO GROWTH
Culture: NO GROWTH

## 2022-08-10 NOTE — TOC Transition Note (Signed)
Transition of Care [facility]) - CM/SW Discharge Note  Patient Details  Name: Theodore King MRN: [id number] Date of Birth: 1946-02-08  Transition of Care [facility]) CM/SW Contact:  Sherie Don, LCSW Phone Number: 08/10/2022, 1:01 PM  Clinical Narrative: CSW followed up with Theodore King with [facility] and the patient has been approved for SNF. [facility] will provide transportation to [facility]. CSW confirmed bed with [facility] in admissions. The number for report is [phone]. Discharge summary, discharge orders, and SNF transfer report faxed to facility in hub. Discharge packet completed. CSW left voicemail for daughter, Theodore King, regarding SNF approval and transportation being set up. TOC signing off.    Final next level of care: Skilled Nursing Facility Barriers to Discharge: Barriers Resolved  Patient Goals and CMS Choice CMS Medicare.gov Compare Post Acute Care list provided to:: Patient Represenative (must comment) Choice offered to / list presented to : [facility] POA / Guardian, Adult Children  Discharge Placement Existing PASRR number confirmed : 08/09/22          Patient chooses bed at: [facility] Patient to be transferred to facility by: [facility] Name of family member notified: Theodore King (daughter/HCPOA) Patient and family notified of of transfer: 08/10/22  Discharge Plan and Services Additional resources added to the After Visit Summary for   In-house Referral: Clinical Social Work Post Acute Care Choice: [place]          DME Arranged: N/A DME Agency: NA  Social Determinants of Health ([place]) Interventions SDOH Screenings   Tobacco Use: Medium Risk (08/05/2022)   Readmission Risk Interventions    04/08/2022    9:56 AM 04/07/2022    8:15 AM  Readmission Risk Prevention Plan  Transportation Screening Complete Complete  Medication Review ([place]) Complete Complete  PCP or Specialist appointment within 3-5 days of discharge  Complete Complete  HRI or Home Care Consult Complete Complete  SW Recovery Care/Counseling Consult Complete Complete  [place] Not Applicable Not Applicable

## 2022-08-10 NOTE — Progress Notes (Signed)
[facility] for a  Discharge Report this AM.  Nurse unavailable, so provided  a call back number. Have not received call yet. Discharge packet send with the patient.

## 2022-08-13 ENCOUNTER — Encounter (HOSPITAL_COMMUNITY): Payer: Self-pay

## 2022-08-13 ENCOUNTER — Other Ambulatory Visit: Payer: Self-pay

## 2022-08-13 ENCOUNTER — Emergency Department (HOSPITAL_COMMUNITY): Payer: Medicare (Managed Care)

## 2022-08-13 ENCOUNTER — Emergency Department (HOSPITAL_COMMUNITY)
Admission: EM | Admit: 2022-08-13 | Discharge: 2022-08-13 | Disposition: A | Discharge status: Skilled Nursing Facility | Payer: Medicare (Managed Care) | Attending: Emergency Medicine | Admitting: Emergency Medicine

## 2022-08-13 DIAGNOSIS — Y92129 Unspecified place in nursing home as the place of occurrence of the external cause: Secondary | ICD-10-CM | POA: Diagnosis not present

## 2022-08-13 DIAGNOSIS — S0990XA Unspecified injury of head, initial encounter: Secondary | ICD-10-CM | POA: Diagnosis present

## 2022-08-13 DIAGNOSIS — W19XXXA Unspecified fall, initial encounter: Secondary | ICD-10-CM | POA: Insufficient documentation

## 2022-08-13 DIAGNOSIS — F0390 Unspecified dementia without behavioral disturbance: Secondary | ICD-10-CM | POA: Diagnosis not present

## 2022-08-13 DIAGNOSIS — G20A1 Parkinson's disease without dyskinesia, without mention of fluctuations: Secondary | ICD-10-CM | POA: Insufficient documentation

## 2022-08-13 NOTE — ED Notes (Signed)
[facility] called for transport.  

## 2022-08-13 NOTE — ED Notes (Signed)
[facility] called for transport to [facility]. Pt is 2nd on the list @ 2147

## 2022-08-13 NOTE — ED Notes (Addendum)
This RN attempted to stand patient per orders.  Pt required assistance from this RN and Tiffany RN to stand.    [place] MD updated of same.

## 2022-08-13 NOTE — ED Provider Notes (Signed)
[facility] Provider Note   CSN: [id number] Arrival date & time: 08/13/22  1751     History  Chief Complaint  Patient presents with   Theodore King is a 77 y.o. male.  Pt is a 77 yo male with pmhx significant for dementia, parkinson's disease, hld, bph, urinary retention requiring indwelling foley and aortic atherosclerosis.  Pt is supposed to use a wheelchair, but was walking and fell.  It was a witnessed fall.  Staff thinks he hit his head.  No blood thinners.  He denies any pain now.       Home Medications Prior to Admission medications   Medication Sig Start Date End Date Taking? Authorizing Provider  acetaminophen (TYLENOL) 500 MG tablet Take 500 mg by mouth every 6 (six) hours as needed for mild pain.    [provider]  albuterol (VENTOLIN HFA) 108 (90 Base) MCG/ACT inhaler Inhale 2 puffs into the lungs every 4 (four) hours as needed for wheezing or shortness of breath.  10/03/18   [provider]  carbidopa-levodopa (SINEMET CR) 50-200 MG tablet Take 1 tablet by mouth at bedtime. 09/24/21   Penumalli, Earlean Polka, MD  carbidopa-levodopa (SINEMET IR) 25-100 MG tablet Take 2 tablets at 7 AM, 2 tablets at 10 AM, 2 tablets at 2 PM and 1 tablet at 6 PM Patient taking differently: Take 2 tablets by mouth See admin instructions. Take 2 tablets by mouth daily at 7 am, 2 tablets by mouth midday (12 pm-2pm), 2 tablets at 2 pm, and 2 tablets at 6 pm 09/24/21   Penumalli, Earlean Polka, MD  cephALEXin (KEFLEX) 500 MG capsule Take 1 capsule (500 mg total) by mouth every 12 (twelve) hours. 08/08/22   [name], MD  Cholecalciferol (VITAMIN D3) 1.25 MG (50000 UT) capsule Take 50,000 Units by mouth every 30 (thirty) days.    [provider]  glycopyrrolate (ROBINUL) 1 MG tablet Take 1 tablet (1 mg total) by mouth 2 (two) times daily. 09/24/21   Penumalli, Earlean Polka, MD  nystatin powder Apply 1 Application topically 3  (three) times daily as needed (skin folds).    [provider]  sertraline (ZOLOFT) 25 MG tablet Take 1 tablet (25 mg total) by mouth daily. 09/24/21   Penumalli, Earlean Polka, MD  traZODone (DESYREL) 50 MG tablet Take 50 mg by mouth at bedtime. 08/17/20   [provider]      Allergies    Patient has no known allergies.    Review of Systems   Review of Systems  All other systems reviewed and are negative.   Physical Exam Updated Vital Signs BP (!) 179/65 (BP Location: Left Arm)   Pulse (!) 59   Temp 98.9 F (37.2 C) (Oral)   Resp 16   SpO2 91%  Physical Exam Vitals and nursing note reviewed.  Constitutional:      Appearance: Normal appearance.  HENT:     Head: Normocephalic and atraumatic.     Right Ear: External ear normal.     Left Ear: External ear normal.     Nose: Nose normal.     Mouth/Throat:     Mouth: Mucous membranes are moist.     Pharynx: Oropharynx is clear.  Eyes:     Extraocular Movements: Extraocular movements intact.     Conjunctiva/sclera: Conjunctivae normal.     Pupils: Pupils are equal, round, and reactive to light.  Cardiovascular:  Rate and Rhythm: Normal rate and regular rhythm.     Pulses: Normal pulses.     Heart sounds: Normal heart sounds.  Pulmonary:     Effort: Pulmonary effort is normal.     Breath sounds: Normal breath sounds.  Abdominal:     General: Abdomen is flat. Bowel sounds are normal.     Palpations: Abdomen is soft.  Genitourinary:    Comments: Indwelling fc Musculoskeletal:        General: Normal range of motion.     Cervical back: Normal range of motion and neck supple.  Skin:    General: Skin is warm.     Capillary Refill: Capillary refill takes less than 2 seconds.  Neurological:     Mental Status: He is alert. Mental status is at baseline.  Psychiatric:        Mood and Affect: Mood normal.        Behavior: Behavior normal.     ED Results / Procedures / Treatments   Labs (all labs ordered are  listed, but only abnormal results are displayed) Labs Reviewed - No data to display  EKG None  Radiology CT Head Wo Contrast  Result Date: 08/13/2022 CLINICAL DATA:  Sudden onset severe headache. EXAM: CT HEAD WITHOUT CONTRAST TECHNIQUE: Contiguous axial images were obtained from the base of the skull through the vertex without intravenous contrast. RADIATION DOSE REDUCTION: This exam was performed according to the departmental dose-optimization program which includes automated exposure control, adjustment of the mA and/or kV according to patient size and/or use of iterative reconstruction technique. COMPARISON:  CT 08/05/2022. FINDINGS: Brain: There is mild global atrophy, mild atrophic ventriculomegaly without midline shift and moderately advanced small-vessel disease in the cerebral white matter. No asymmetry is seen concerning for an acute infarct, hemorrhage or mass. There are benign dural calcifications scattered along the falx. Basal cisterns are clear. Vascular: Scattered calcific plaque distal left vertebral artery and both siphons. No hyperdense central vessels. Skull: Negative for fractures or focal lesions. Sinuses/Orbits: Clear sinuses and mastoids. Negative orbits apart from old lens replacements. S shaped nasal septum. Other: None. IMPRESSION: No acute intracranial CT findings.  Stable chronic changes. Electronically Signed   By: Telford Nab M.D.   On: 08/13/2022 20:07   DG Chest 2 View  Result Date: 08/13/2022 CLINICAL DATA:  Trauma, fall EXAM: CHEST - 2 VIEW COMPARISON:  08/05/2022 FINDINGS: Transverse diameter of heart is increased. There is marked elevation of left hemidiaphragm. Lung fields are clear of any infiltrates or pulmonary edema. Lateral CP angles are clear. There is no pneumothorax. Patient's chin is partly obscuring the apices. IMPRESSION: Cardiomegaly. There are no signs of pulmonary edema or focal pulmonary consolidation. Electronically Signed   By: Elmer Picker  M.D.   On: 08/13/2022 18:43   DG Pelvis 1-2 Views  Result Date: 08/13/2022 CLINICAL DATA:  Trauma, fall EXAM: PELVIS - 1-2 VIEW COMPARISON:  Abdomen radiographs done on 10/25/2021 FINDINGS: No displaced fracture or dislocation is seen. Joint spaces in both hips appear symmetrical. Scattered vascular calcifications are seen. Degenerative changes are noted in visualized lower lumbar spine. Coarse calcifications are seen in prostate. IMPRESSION: No recent displaced fracture or dislocation is seen. Lumbar spondylosis. Electronically Signed   By: Elmer Picker M.D.   On: 08/13/2022 18:42    Procedures Procedures    Medications Ordered in ED Medications - No data to display  ED Course/ Medical Decision Making/ A&P  Medical Decision Making Amount and/or Complexity of Data Reviewed Radiology: ordered.   This patient presents to the ED for concern of fall, this involves an extensive number of treatment options, and is a complaint that carries with it a high risk of complications and morbidity.  The differential diagnosis includes multiple trauma   Co morbidities that complicate the patient evaluation  ementia, parkinson's disease, hld, bph, urinary retention requiring indwelling foley and aortic atherosclerosis   Additional history obtained:  Additional history obtained from epic chart review External records from outside source obtained and reviewed including EMS report  Imaging Studies ordered:  I ordered imaging studies including ct head, cxr, pelvis  I independently visualized and interpreted imaging which showed  CT head: No acute intracranial CT findings.  Stable chronic changes.  CXR: Cardiomegaly. There are no signs of pulmonary edema or focal  pulmonary consolidation.  Pelvis: No recent displaced fracture or dislocation is seen. Lumbar  spondylosis.   I agree with the radiologist interpretation   Cardiac Monitoring:  The patient was  maintained on a cardiac monitor.  I personally viewed and interpreted the cardiac monitored which showed an underlying rhythm of: nsr   Medicines ordered and prescription drug management:   I have reviewed the patients home medicines and have made adjustments as needed   Test Considered:  ct   Problem List / ED Course:  Fall:  no injury.  Pt able to stand with assistance.  I did try to call daughter, but she did not answer, so I left a message for a call back.  I have not heard back.  Pt is stable for d/c.  Return if worse.  F/u with pcp.   Reevaluation:  After the interventions noted above, I reevaluated the patient and found that they have :improved   Social Determinants of Health:  Lives in SNF   Dispostion:  After consideration of the diagnostic results and the patients response to treatment, I feel that the patent would benefit from discharge with outpatient f/u.          Final Clinical Impression(s) / ED Diagnoses Final diagnoses:  Fall, initial encounter    Rx / DC Orders ED Discharge Orders     None         Isla Pence, MD 08/13/22 2142

## 2022-08-13 NOTE — ED Notes (Signed)
Pt being transferred back to facility via stretcher by [facility] with all belongings.  Chronic indwelling Foley remains in place upon discharge.

## 2022-08-13 NOTE — ED Notes (Signed)
Report given to [place], Therapist, sports.

## 2022-08-13 NOTE — ED Triage Notes (Signed)
PT BIB EMS for a witnessed fall at [facility], staff thinks he may have hit his head.  Not on thinners, is supposed to be utilizing a wheelchair but was walking, has dementia and dysphasia at baseline.    BP 138/64 HR 52 100 RA 16 Resp

## 2022-08-13 NOTE — ED Notes (Signed)
Spoke to patients daughter, Son Bonesteel, she said to please call with an update at [phone].

## 2022-09-04 ENCOUNTER — Emergency Department (HOSPITAL_COMMUNITY): Payer: Medicare (Managed Care)

## 2022-09-04 ENCOUNTER — Encounter (HOSPITAL_COMMUNITY): Payer: Self-pay

## 2022-09-04 ENCOUNTER — Other Ambulatory Visit: Payer: Self-pay

## 2022-09-04 ENCOUNTER — Emergency Department (HOSPITAL_COMMUNITY)
Admission: EM | Admit: 2022-09-04 | Discharge: 2022-09-05 | Disposition: A | Discharge status: Home / Self Care | Payer: Medicare (Managed Care) | Attending: Emergency Medicine | Admitting: Emergency Medicine

## 2022-09-04 DIAGNOSIS — Z8616 Personal history of COVID-19: Secondary | ICD-10-CM | POA: Diagnosis not present

## 2022-09-04 DIAGNOSIS — W19XXXA Unspecified fall, initial encounter: Secondary | ICD-10-CM | POA: Diagnosis not present

## 2022-09-04 DIAGNOSIS — R29810 Facial weakness: Secondary | ICD-10-CM | POA: Diagnosis not present

## 2022-09-04 DIAGNOSIS — F03918 Unspecified dementia, unspecified severity, with other behavioral disturbance: Secondary | ICD-10-CM | POA: Diagnosis not present

## 2022-09-04 DIAGNOSIS — I6782 Cerebral ischemia: Secondary | ICD-10-CM | POA: Insufficient documentation

## 2022-09-04 DIAGNOSIS — R4781 Slurred speech: Secondary | ICD-10-CM | POA: Diagnosis not present

## 2022-09-04 DIAGNOSIS — G20C Parkinsonism, unspecified: Secondary | ICD-10-CM | POA: Diagnosis not present

## 2022-09-04 DIAGNOSIS — Z87891 Personal history of nicotine dependence: Secondary | ICD-10-CM | POA: Diagnosis not present

## 2022-09-04 DIAGNOSIS — R4182 Altered mental status, unspecified: Secondary | ICD-10-CM | POA: Diagnosis not present

## 2022-09-04 DIAGNOSIS — R410 Disorientation, unspecified: Secondary | ICD-10-CM | POA: Diagnosis present

## 2022-09-04 LAB — I-STAT CHEM 8, ED
BUN: 26 mg/dL — ABNORMAL HIGH (ref 8–23)
Calcium, Ion: 1.2 mmol/L (ref 1.15–1.40)
Chloride: 102 mmol/L (ref 98–111)
Creatinine, Ser: 1.7 mg/dL — ABNORMAL HIGH (ref 0.61–1.24)
Glucose, Bld: 101 mg/dL — ABNORMAL HIGH (ref 70–99)
HCT: 28 % — ABNORMAL LOW (ref 39.0–52.0)
Hemoglobin: 9.5 g/dL — ABNORMAL LOW (ref 13.0–17.0)
Potassium: 4.1 mmol/L (ref 3.5–5.1)
Sodium: 139 mmol/L (ref 135–145)
TCO2: 28 mmol/L (ref 22–32)

## 2022-09-04 LAB — COMPREHENSIVE METABOLIC PANEL
ALT: 7 U/L (ref 0–44)
AST: 25 U/L (ref 15–41)
Albumin: 3 g/dL — ABNORMAL LOW (ref 3.5–5.0)
Alkaline Phosphatase: 80 U/L (ref 38–126)
Anion gap: 10 (ref 5–15)
BUN: 26 mg/dL — ABNORMAL HIGH (ref 8–23)
CO2: 25 mmol/L (ref 22–32)
Calcium: 8.9 mg/dL (ref 8.9–10.3)
Chloride: 103 mmol/L (ref 98–111)
Creatinine, Ser: 1.55 mg/dL — ABNORMAL HIGH (ref 0.61–1.24)
GFR, Estimated: 46 mL/min — ABNORMAL LOW (ref >60)
Glucose, Bld: 103 mg/dL — ABNORMAL HIGH (ref 70–99)
Potassium: 4.1 mmol/L (ref 3.5–5.1)
Sodium: 138 mmol/L (ref 135–145)
Total Bilirubin: 0.5 mg/dL (ref 0.3–1.2)
Total Protein: 6.9 g/dL (ref 6.5–8.1)

## 2022-09-04 LAB — DIFFERENTIAL
Abs Immature Granulocytes: 0.02 10*3/uL (ref 0.00–0.07)
Basophils Absolute: 0 10*3/uL (ref 0.0–0.1)
Basophils Relative: 1 %
Eosinophils Absolute: 0.1 10*3/uL (ref 0.0–0.5)
Eosinophils Relative: 2 %
Immature Granulocytes: 0 %
Lymphocytes Relative: 21 %
Lymphs Abs: 1.6 10*3/uL (ref 0.7–4.0)
Monocytes Absolute: 0.6 10*3/uL (ref 0.1–1.0)
Monocytes Relative: 7 %
Neutro Abs: 5.5 10*3/uL (ref 1.7–7.7)
Neutrophils Relative %: 69 %

## 2022-09-04 LAB — URINALYSIS, ROUTINE W REFLEX MICROSCOPIC
Bacteria, UA: NONE SEEN
Bilirubin Urine: NEGATIVE
Glucose, UA: NEGATIVE mg/dL
Ketones, ur: NEGATIVE mg/dL
Nitrite: NEGATIVE
Protein, ur: 30 mg/dL — AB
Specific Gravity, Urine: 1.018 (ref 1.005–1.030)
WBC, UA: >50 WBC/hpf (ref 0–5)
pH: 7 (ref 5.0–8.0)

## 2022-09-04 LAB — RAPID URINE DRUG SCREEN, HOSP PERFORMED
Amphetamines: NOT DETECTED
Barbiturates: NOT DETECTED
Benzodiazepines: NOT DETECTED
Cocaine: NOT DETECTED
Opiates: NOT DETECTED
Tetrahydrocannabinol: NOT DETECTED

## 2022-09-04 LAB — CBC
HCT: 30.8 % — ABNORMAL LOW (ref 39.0–52.0)
Hemoglobin: 9.7 g/dL — ABNORMAL LOW (ref 13.0–17.0)
MCH: 26.5 pg (ref 26.0–34.0)
MCHC: 31.5 g/dL (ref 30.0–36.0)
MCV: 84.2 fL (ref 80.0–100.0)
Platelets: 165 10*3/uL (ref 150–400)
RBC: 3.66 MIL/uL — ABNORMAL LOW (ref 4.22–5.81)
RDW: 14 % (ref 11.5–15.5)
WBC: 7.9 10*3/uL (ref 4.0–10.5)
nRBC: 0 % (ref 0.0–0.2)

## 2022-09-04 LAB — ETHANOL: Alcohol, Ethyl (B): <10 mg/dL (ref <10)

## 2022-09-04 LAB — PROTIME-INR
INR: 1.2 (ref 0.8–1.2)
Prothrombin Time: 14.9 seconds (ref 11.4–15.2)

## 2022-09-04 LAB — CBG MONITORING, ED: Glucose-Capillary: 103 mg/dL — ABNORMAL HIGH (ref 70–99)

## 2022-09-04 LAB — APTT: aPTT: 41 seconds — ABNORMAL HIGH (ref 24–36)

## 2022-09-04 MED ORDER — IOPAMIDOL (ISOVUE-370) INJECTION 76%
75.0000 mL | Freq: Once | INTRAVENOUS | Status: AC | PRN
  Administered 2022-09-04: 75 mL via INTRAVENOUS

## 2022-09-04 MED ORDER — HALOPERIDOL LACTATE 5 MG/ML IJ SOLN: 5.0000 mg | Freq: Once | INTRAMUSCULAR | Status: AC

## 2022-09-04 MED ORDER — LORAZEPAM 2 MG/ML IJ SOLN
1.0000 mg | Freq: Once | INTRAMUSCULAR | Status: AC
  Administered 2022-09-04: 1 mg via INTRAVENOUS
  Filled 2022-09-04: qty 1

## 2022-09-04 MED ORDER — HALOPERIDOL LACTATE 5 MG/ML IJ SOLN
INTRAMUSCULAR | Status: AC
  Administered 2022-09-04: 5 mg via INTRAVENOUS
  Filled 2022-09-04: qty 1

## 2022-09-04 MED ORDER — LORAZEPAM 2 MG/ML IJ SOLN
1.0000 mg | Freq: Once | INTRAMUSCULAR | Status: AC
  Administered 2022-09-04: 1 mg via INTRAVENOUS

## 2022-09-04 MED ORDER — IOHEXOL 350 MG/ML SOLN
75.0000 mL | Freq: Once | INTRAVENOUS | Status: AC | PRN
  Administered 2022-09-04: 75 mL via INTRAVENOUS

## 2022-09-04 NOTE — Consult Note (Signed)
Neurology Consultation  Reason for Consult: Confusion, left facial droop and slurred speech Referring Physician: Dr. Laverta Baltimore  CC: None  History is obtained from: MS and chart  HPI: Theodore King is a 77 y.o. male with history of hyperlipidemia, Parkinson's disease, dementia and urinary retention with chronic Foley who was admitted from a facility today after a mechanical fall down to 1 knee, acute onset confusion slurred speech and left-sided facial droop.  Upon arrival, patient is oriented to self only with slight dysarthria and subtle left facial droop.   LKW: [id number] TNK given?: no, no stroke seen on MRI IR Thrombectomy? No, no LVO Modified Rankin Scale: 3-Moderate disability-requires help but walks WITHOUT assistance  ROS:  Unable to obtain due to altered mental status.   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 His smoking use included cigarettes. He started smoking about 69 years ago. He has a 10.00 pack-year smoking history. He has never used smokeless tobacco. He reports that he does not currently use alcohol. He reports that he does not use drugs.  Medications  Current Facility-Administered Medications:    haloperidol lactate (HALDOL) 5 MG/ML injection, , , ,    haloperidol lactate (HALDOL) injection 5 mg, 5 mg, Intravenous, Once, de [employer],  E, NP   LORazepam (ATIVAN) injection 1 mg, 1 mg, Intravenous, Once, de [employer],  E, NP   LORazepam (ATIVAN) injection 1 mg, 1 mg, Intravenous, Once, de Yolanda Manges,  E, NP  Current Outpatient Medications:    albuterol (VENTOLIN HFA) 108 (90 Base) MCG/ACT inhaler, Inhale 2 puffs into the lungs every 6 (six) hours as needed for wheezing., Disp: , Rfl:    bisacodyl (DULCOLAX) 10 MG suppository, Place 10 mg rectally daily as needed (constipation not relieved by Milk of Magnesia)., Disp: , Rfl:    carbidopa-levodopa (SINEMET CR) 50-200 MG tablet, Take 1 tablet by mouth at bedtime., Disp: 90 tablet, Rfl: 4   carbidopa-levodopa (SINEMET IR) 25-100 MG tablet, Take 2 tablets at 7 AM, 2 tablets at 10 AM, 2 tablets at 2 PM and 1 tablet at 6 PM (Patient taking differently: Take 2 tablets by mouth See admin instructions. 2 entries on MAR: 1) 2 tablets 3 times  daily (1000, 1400, 1800) 2) 2 tablets once daily (0700)), Disp: 210 tablet, Rfl: 12   glycopyrrolate (ROBINUL) 1 MG tablet, Take 1 tablet (1 mg total) by mouth 2 (two) times daily., Disp: 60 tablet, Rfl: 6   Magnesium Hydroxide (MILK OF MAGNESIA PO), Take 30 mLs by mouth daily as needed (constipation)., Disp: , Rfl:    Nutritional Supplements (NUTRITIONAL SHAKE) LIQD, Take 1 each by mouth 2 (two) times daily., Disp: , Rfl:    nystatin (MYCOSTATIN/NYSTOP) powder, Apply 1 Application topically 3 (three) times daily as needed (moisture associated dermatitis)., Disp: , Rfl:     sertraline (ZOLOFT) 25 MG tablet, Take 1 tablet (25 mg total) by mouth daily., Disp: 30 tablet, Rfl: 6   Sodium Phosphates (ENEMA RE), Place 1 enema rectally daily as needed (constipation not relieved by bisacodyl suppository)., Disp: , Rfl:    traZODone (DESYREL) 50 MG tablet, Take 50 mg by mouth at bedtime., Disp: , Rfl:    cephALEXin (KEFLEX) 500 MG capsule, Take 1 capsule (500 mg total) by mouth every 12 (twelve) hours. (Patient not taking: Reported on 09/04/2022), Disp: 16 capsule, Rfl: 0   Exam: Current vital signs: BP 107/71   Pulse 62   Temp 97.6 F (36.4 C) (Axillary)   Resp 17   SpO2 98%  Vital signs in last 24 hours: Temp:  [97.6 F (36.4 C)] 97.6 F (36.4 C) (03/30 1520) Pulse Rate:  [62] 62 (03/30 1520) Resp:  [16-17] 17 (03/30 1530) BP: (107)/(71) 107/71 (03/30 1520) SpO2:  [98 %] 98 % (03/30 1520)  GENERAL: Awake, alert, in no acute distress Psych: Affect appropriate for situation, patient is calm and cooperative with examination Head: Normocephalic and atraumatic, without obvious abnormality EENT: Normal conjunctivae, moist mucous membranes, no OP obstruction LUNGS: Normal respiratory effort. Non-labored breathing on room air CV: Regular rate and rhythm on telemetry Extremities: warm, well perfused, without obvious deformity  NEURO:  Mental Status: Awake, alert, and oriented to person but disoriented to place time and situation He is not able to provide a clear and coherent history of present illness. Speech/Language: speech is lightly dysarthric.   Naming, repetition, fluency, and comprehension intact without aphasia  No neglect is noted Cranial Nerves:  II: PERRL visual fields full.  III, IV, VI: EOMI. Lid elevation symmetric and full.  V: Sensation is intact to light touch and symmetrical to face.  VII: Subtle left facial droop VIII: Hearing intact to voice IX, X: Voice is slightly dysarthric XI: Normal sternocleidomastoid and trapezius muscle  strength XII: Tongue protrudes midline without fasciculations.   Motor: 5/5 strength is all muscle groups.  Tone is normal. Bulk is normal.  Sensation: Intact to light touch bilaterally in all four extremities. No extinction to DSS present.  Coordination: FTN intact bilaterally. No pronator drift.  Gait: Deferred  NIHSS: 1a Level of Conscious.: 0 1b LOC Questions: 1 1c LOC Commands: 0 2 Best Gaze: 0 3 Visual: 0 4 Facial Palsy: 1 5a Motor Arm - left: 0 5b Motor Arm - Right: 0 6a Motor Leg - Left: 0 6b Motor Leg - Right: 0 7 Limb Ataxia: 0 8 Sensory: 0 9 Best Language: 0 10 Dysarthria: 1 11 Extinct. and Inatten.: 0 TOTAL: 3   Labs I have reviewed labs in epic and the results pertinent to this consultation are:   CBC    Component Value Date/Time   WBC 7.9 09/04/2022 1539   RBC 3.66 (L) 09/04/2022 1539   HGB 9.5 (L) 09/04/2022 1547  HCT 28.0 (L) 09/04/2022 1547   PLT 165 09/04/2022 1539   MCV 84.2 09/04/2022 1539   MCH 26.5 09/04/2022 1539   MCHC 31.5 09/04/2022 1539   RDW 14.0 09/04/2022 1539   LYMPHSABS 1.6 09/04/2022 1539   MONOABS 0.6 09/04/2022 1539   EOSABS 0.1 09/04/2022 1539   BASOSABS 0.0 09/04/2022 1539    CMP     Component Value Date/Time   NA 139 09/04/2022 1547   NA 141 10/22/2016 0000   K 4.1 09/04/2022 1547   CL 102 09/04/2022 1547   CO2 25 09/04/2022 1539   GLUCOSE 101 (H) 09/04/2022 1547   BUN 26 (H) 09/04/2022 1547   BUN 14 10/22/2016 0000   CREATININE 1.70 (H) 09/04/2022 1547   CALCIUM 8.9 09/04/2022 1539   PROT 6.9 09/04/2022 1539   ALBUMIN 3.0 (L) 09/04/2022 1539   AST 25 09/04/2022 1539   ALT 7 09/04/2022 1539   ALKPHOS 80 09/04/2022 1539   BILITOT 0.5 09/04/2022 1539   GFRNONAA 46 (L) 09/04/2022 1539   GFRAA >60 04/10/2019 0405    Lipid Panel     Component Value Date/Time   CHOL 125 04/08/2019 0835   CHOL 176 09/12/2013 0952   TRIG 61 04/08/2019 0835   HDL 32 (L) 04/08/2019 0835   HDL 59 09/12/2013 0952   CHOLHDL 3.9  04/08/2019 0835   VLDL 12 04/08/2019 0835   LDLCALC 81 04/08/2019 0835   LDLCALC 100 (H) 09/12/2013 0952     Imaging I have reviewed the images obtained:  CT-scan of the brain: No acute abnormality  CTA head and neck  MRI examination of the brain: No acute abnormality, motion degraded study  Assessment: 77 year old patient with history of hyperlipidemia, Parkinson's disease, dementia and urinary retention with chronic Foley was admitted with his facility today with acute onset confusion, slurred speech and left-sided facial droop.  He reportedly was reaching for objects that did not exist and appeared more confused than usual.  His baseline is unclear.  On exam, he is oriented to self only with slight dysarthria and very subtle left facial droop.  CT head was negative for acute abnormality, MRI brain was negative for acute stroke.  Luxiq metabolic encephalopathy is likely, however patient is afebrile without leukocytosis.  Given chronic Foley, would be reasonable to obtain urinalysis to rule out UTI.  However, suspect that presentation with acute confusion is due to behavioral fluctuation in the setting of dementia.  Impression: Toxic metabolic encephalopathy versus behavioral fluctuation in patient with dementia  Recommendations: -Recommend urinalysis -Workup for causes of toxic metabolic encephalopathy per primary team -Neurology will follow as needed  Pt seen by NP/Neuro and later by MD. Note/plan to be edited by MD as needed.  Talty , MSN, AGACNP-BC [facility] See Amion for schedule and pager information 09/04/2022 4:59 PM

## 2022-09-04 NOTE — ED Notes (Addendum)
Patient given water, apple sauce, and sandwich. Patient finished apple sauce and water beforee patient went back to sleep. Call light within reach.

## 2022-09-04 NOTE — ED Provider Notes (Signed)
Emergency Department Provider Note   I have reviewed the triage vital signs and the nursing notes.   HISTORY  Chief Complaint Code Stroke   HPI Theodore King is a 77 y.o. male with PMH of parkinson's disease presents to the ED after fall at nursing home with acute change in mental status. Patient roomed after arrival by EMS from his nursing facility.  Apparently last normal at 2:55 PM.  He was normal at that time but according to staff fell landing to his left knee and afterwards seemed acutely confused with speech disturbance and possible left face droop.  Code stroke was not activated by EMS but I was asked to evaluate immediately upon arrival.  Patient does have some baseline dementia and so history is challenging.  Level 5 caveat applies.    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 Following commands.  Eyes: Conjunctivae are normal. PERRL.  Head: Atraumatic. Nose: No  congestion/rhinnorhea. Mouth/Throat: Mucous membranes are moist.  Neck: No stridor.   Cardiovascular: Normal rate, regular rhythm. Good peripheral circulation. Grossly normal heart sounds.   Respiratory: Normal respiratory effort.  No retractions. Lungs CTAB. Gastrointestinal: Soft and nontender. No distention.  Musculoskeletal: No lower extremity tenderness nor edema. No gross deformities of extremities. Neurologic: Dysarthria with hesitancy of speech. Flattening of the left nasolabial fold. 5/5 strength in the bilateral upper and lower extremities.  Skin:  Skin is warm, dry and intact. No rash noted.  ____________________________________________   LABS (all labs ordered are listed, but only abnormal results are displayed)  Labs Reviewed  APTT - Abnormal; Notable for the following components:      Result Value   aPTT 41 (*)    All other components within normal limits  CBC - Abnormal; Notable for the following components:   RBC 3.66 (*)    Hemoglobin 9.7 (*)    HCT 30.8 (*)    All other components within normal limits  COMPREHENSIVE METABOLIC PANEL - Abnormal; Notable for the following components:   Glucose, Bld 103 (*)    BUN 26 (*)    Creatinine, Ser 1.55 (*)    Albumin 3.0 (*)    GFR, Estimated 46 (*)    All other components within normal limits  URINALYSIS, ROUTINE W REFLEX MICROSCOPIC - Abnormal; Notable for the following components:   APPearance HAZY (*)    Hgb urine dipstick MODERATE (*)    Protein, ur 30 (*)  Leukocytes,Ua LARGE (*)    All other components within normal limits  I-STAT CHEM 8, ED - Abnormal; Notable for the following components:   BUN 26 (*)    Creatinine, Ser 1.70 (*)    Glucose, Bld 101 (*)    Hemoglobin 9.5 (*)    HCT 28.0 (*)    All other components within normal limits  CBG MONITORING, ED - Abnormal; Notable for the following components:   Glucose-Capillary 103 (*)    All other components within normal limits  ETHANOL  PROTIME-INR   DIFFERENTIAL  RAPID URINE DRUG SCREEN, HOSP PERFORMED   ____________________________________________  EKG   EKG Interpretation  Date/Time:  Saturday September 04 2022 15:17:17 EDT Ventricular Rate:  63 PR Interval:  208 QRS Duration: 99 QT Interval:  415 QTC Calculation: 425 R Axis:   18 Text Interpretation: Sinus rhythm Probable left ventricular hypertrophy Confirmed by Alona Bene [id number]) on 09/04/2022 3:34:46 PM        ____________________________________________  RADIOLOGY  CT VENOGRAM HEAD  Result Date: 09/04/2022 CLINICAL DATA:  Dural venous sinus thrombosis suspected EXAM: CT VENOGRAM HEAD TECHNIQUE: Venographic phase images of the brain were obtained following the administration of intravenous contrast. Multiplanar reformats and maximum intensity projections were generated. RADIATION DOSE REDUCTION: This exam was performed according to the departmental dose-optimization program which includes automated exposure control, adjustment of the mA and/or kV according to patient size and/or use of iterative reconstruction technique. CONTRAST:  75mL ISOVUE-370 IOPAMIDOL (ISOVUE-370) INJECTION 76% COMPARISON:  CT and MRI studies same day FINDINGS: Superior sagittal sinus is normal. Transverse and sigmoid sinuses are normal. Jugular veins are patent. Deep venous system is patent. No sign of superficial thrombosis. IMPRESSION: Negative CT venogram. Electronically Signed   By: Paulina Fusi M.D.   On: 09/04/2022 18:46   MR BRAIN WO CONTRAST  Result Date: 09/04/2022 CLINICAL DATA:  Neuro deficit, acute, stroke suspected.  Confusion. EXAM: MRI HEAD WITHOUT CONTRAST TECHNIQUE: Multiplanar, multiecho pulse sequences of the brain and surrounding structures were obtained without intravenous contrast. COMPARISON:  CT studies earlier same day FINDINGS: Brain: The study suffers from some motion artifact. Diffusion imaging does not show any acute or subacute infarction. The brainstem and cerebellum are  normal. Cerebral hemispheres show age related volume loss with mild chronic small-vessel ischemic change of the deep white matter. No cortical or large vessel territory infarction. No mass lesion, hemorrhage, hydrocephalus or extra-axial collection. Vascular: Major vessels at the base of the brain show flow. Skull and upper cervical spine: Negative Sinuses/Orbits: Clear/normal Other: None IMPRESSION: Motion degraded study. No acute finding. Age related volume loss. Mild chronic small-vessel ischemic change of the cerebral hemispheric deep white matter. Electronically Signed   By: Paulina Fusi M.D.   On: 09/04/2022 17:14   CT ANGIO HEAD NECK W WO CM (CODE STROKE)  Result Date: 09/04/2022 CLINICAL DATA:  Stroke suspected EXAM: CT ANGIOGRAPHY HEAD AND NECK TECHNIQUE: Multidetector CT imaging of the head and neck was performed using the standard protocol during bolus administration of intravenous contrast. Multiplanar CT image reconstructions and MIPs were obtained to evaluate the vascular anatomy. Carotid stenosis measurements (when applicable) are obtained utilizing NASCET criteria, using the distal internal carotid diameter as the denominator. RADIATION DOSE REDUCTION: This exam was performed according to the departmental dose-optimization program which includes automated exposure control, adjustment of the mA and/or kV according to patient size and/or use of iterative reconstruction technique. CONTRAST:  75mL OMNIPAQUE IOHEXOL 350 MG/ML SOLN COMPARISON:  None Available. FINDINGS: CT  HEAD FINDINGS See same day CT brain for intracranial findings. CTA NECK FINDINGS Aortic arch: Two-vessel arch. Imaged portion shows no evidence of aneurysm or dissection. No significant stenosis of the major arch vessel origins. Right carotid system: No evidence of dissection, stenosis (50% or greater), or occlusion. Left carotid system: No evidence of dissection, stenosis (50% or greater), or occlusion. Vertebral arteries:  Codominant. No evidence of dissection, stenosis (50% or greater), or occlusion.There is mild narrowing of the origin of bilateral vertebral arteries Skeleton: Negative. Other neck: 1.6 cm left thyroid nodule. Recommend further evaluation with a dedicated thyroid ultrasound. Upper chest: Negative. Review of the MIP images confirms the above findings CTA HEAD FINDINGS Anterior circulation: No significant stenosis, proximal occlusion, aneurysm, or vascular malformation. Posterior circulation: No significant stenosis, proximal occlusion, aneurysm, or vascular malformation. Venous sinuses: Focally decreased opacification of the left transverse sinus (series 7, image 92). While this may be artifactual, further evaluation with MRV is recommended to exclude the possibility of dural venous sinus thrombosis. Anatomic variants: None Review of the MIP images confirms the above findings IMPRESSION: 1. No intracranial large vessel occlusion or significant stenosis. 2. No hemodynamically significant stenosis in the neck. 3. Focally decreased opacification of the left transverse sinus. While this may be artifactual, further evaluation with MRV is recommended to exclude the possibility of dural venous sinus thrombosis. 4. 1.6 cm left thyroid nodule. Recommend further evaluation with a dedicated thyroid ultrasound. Electronically Signed   By: Lorenza Cambridge M.D.   On: 09/04/2022 16:33   CT HEAD CODE STROKE WO CONTRAST  Result Date: 09/04/2022 CLINICAL DATA:  Code stroke.  Left-sided facial droop.  Dysarthria EXAM: CT HEAD WITHOUT CONTRAST TECHNIQUE: Contiguous axial images were obtained from the base of the skull through the vertex without intravenous contrast. RADIATION DOSE REDUCTION: This exam was performed according to the departmental dose-optimization program which includes automated exposure control, adjustment of the mA and/or kV according to patient size and/or use of iterative reconstruction technique. COMPARISON:  CT  Head 08/13/22 FINDINGS: Brain: No evidence of acute infarction, hemorrhage, hydrocephalus, extra-axial collection or mass lesion/mass effect. Vascular: No hyperdense vessel or unexpected calcification. Skull: Normal. Negative for fracture or focal lesion. Sinuses/Orbits: No middle ear or mastoid effusion. Paranasal sinuses are clear. Bilateral lens replacement. Orbits are otherwise unremarkable. Other: None. ASPECTS [facility] Stroke Program Early CT Score): 10 IMPRESSION: No hemorrhage or CT evidence of an acute infarct.   Aspects is 10. Findings were paged to Dr. Vance Gather on 09/04/22 at 3:59 PM via [employer] paging system Electronically Signed   By: Lorenza Cambridge M.D.   On: 09/04/2022 16:00    ____________________________________________   PROCEDURES  Procedure(s) performed:   Procedures  CRITICAL CARE Performed by: Maia Plan Total critical care time: 35 minutes Critical care time was exclusive of separately billable procedures and treating other patients. Critical care was necessary to treat or prevent imminent or life-threatening deterioration. Critical care was time spent personally by me on the following activities: development of treatment plan with patient and/or surrogate as well as nursing, discussions with consultants, evaluation of patient's response to treatment, examination of patient, obtaining history from patient or surrogate, ordering and performing treatments and interventions, ordering and review of laboratory studies, ordering and review of radiographic studies, pulse oximetry and re-evaluation of patient's condition.  Alona Bene, MD Emergency Medicine  ____________________________________________   INITIAL IMPRESSION / ASSESSMENT AND PLAN / ED COURSE  Pertinent labs & imaging results that were available during my care of the  patient were reviewed by me and considered in my medical decision making (see chart for details).   This patient is Presenting for Evaluation of  AMS, which does require a range of treatment options, and is a complaint that involves a high risk of morbidity and mortality.  The Differential Diagnoses includes but is not exclusive to alcohol, illicit or prescription medications, intracranial pathology such as stroke, intracerebral hemorrhage, fever or infectious causes including sepsis, hypoxemia, uremia, trauma, endocrine related disorders such as diabetes, hypoglycemia, thyroid-related diseases, etc.   Critical Interventions-    Medications  LORazepam (ATIVAN) injection 1 mg (1 mg Intravenous Given 09/04/22 1630)  iohexol (OMNIPAQUE) 350 MG/ML injection 75 mL (75 mLs Intravenous Contrast Given 09/04/22 1613)  LORazepam (ATIVAN) injection 1 mg (1 mg Intravenous Given 09/04/22 1640)  haloperidol lactate (HALDOL) injection 5 mg (5 mg Intravenous Given 09/04/22 1648)  iopamidol (ISOVUE-370) 76 % injection 75 mL (75 mLs Intravenous Contrast Given 09/04/22 1827)    Reassessment after intervention: Patient sedated by Neurology during CVA workup with Haldol/Ativan for MRI. Returns somnolent.    I did obtain Additional Historical Information from EMS.  I decided to review pertinent External Data, and in summary patient with ED visit on 3/8 for fall.  Apparently conversational at that time. Last admit was 08/10/22 for catheter associated UTI.   Clinical Laboratory Tests Ordered, included CBC without leukocytosis.  UA with no bacteria.  Baseline CKD.  EtOH negative.  Radiologic Tests Ordered, included CT head/MRI brain. I independently interpreted the images and agree with radiology interpretation.   Cardiac Monitor Tracing which shows NSR.    Social Determinants of Health Risk patient is not an active smoker.   Consult complete with Neurology as a Code Stroke.  Patient clear from neurology standpoint.  No evidence of stroke.  Suspected dementia/behavioral disturbance.   Medical Decision Making: Summary:  Presents emergency department for  evaluation of acute onset altered mental status.  Apparently fell to his knee but did not strike his head.  No outward sign of head trauma.  On my assessment he when he arrives in the room he is unable to provide significant history.  He appears dysarthric and appears to also have flattening of the left nasolabial fold.  Given these deficits, code stroke activated as he is within the window for intervention although deficits appear fairly mild at this time.  Differential includes infectious etiology such as catheter associated UTI for which she was admitted earlier this month.  Reevaluation with update and discussion with patient.  He awakens if you speak to him but remains drowsy after sedation for MRI.  Suspect that this is related to dementia related behavior change.  No evidence of UTI.  He will need to metabolize the medications given during the neurology evaluation.  Anticipate discharge once more alert and taking PO. Care transferred to Dr. Blinda Leatherwood.   Considered admission but no evidence of acute stroke.   Patient's presentation is most consistent with acute presentation with potential threat to life or bodily function.   Disposition: pending   ____________________________________________  FINAL CLINICAL IMPRESSION(S) / ED DIAGNOSES  Final diagnoses:  Dementia with other behavioral disturbance, unspecified dementia severity, unspecified dementia type (HCC)    Note:  This document was prepared using Dragon voice recognition software and may include unintentional dictation errors.  Alona Bene, MD, [facility] Emergency Medicine    Neeley Sedivy, Arlyss Repress, MD 09/08/22 [phone]

## 2022-09-04 NOTE — Code Documentation (Addendum)
Stroke Response Nurse Documentation Code Documentation  Theodore King is a 77 y.o. male arriving to [facility]  via [facility] on 09/04/22 with past medical hx of HLD, Parkinson's, urinary retention with chronic foley. On No antithrombotic. Code stroke was activated by ED.   Patient from [facility] when he was eating lunch and staff noticed he had a mechanical fall and then staff noticed slurred speech and confusion at 1445.   Stroke team at the bedside on patient activation. Labs drawn and patient cleared for CT by Dr. Laverta Baltimore. Patient to CT with team. NIHSS 3, see documentation for details and code stroke times. Patient with disoriented, left facial droop, and dysarthria  on exam. The following imaging was completed:  CT Head, CTA, and MRI.   Patient is not a candidate for IV Thrombolytic due to symptoms mild. Patient is not a candidate for IR due to no LVO.   Care Plan: q2h vitals and NIHSS, MRI. Permissive hypertension <220/110.  Bedside handoff with ED RN Vonna Kotyk.    Candace Cruise K  Rapid Response RN

## 2022-09-04 NOTE — ED Notes (Signed)
Ativan and haldol given for sedation to accomplish MRI per neurology

## 2022-09-04 NOTE — ED Triage Notes (Signed)
PT BIB [facility] from [facility]. Per EMS Post witnessed mech.fall to one knee by facility, the pt was eating and staff observed slurred speech, confusion, and reaching for things at approx. 1455.[facility] only noted confusing of left and right when checking for sensation.  No complaints on arrival.  PT has hx of parkinsons, dementia and UTI's.  Pt has chronic foley use.    124/60 99% 70 CBG 131

## 2022-09-05 NOTE — ED Notes (Signed)
Patient depends changed and catheter care completed. Linens changed. Call light within reach. No acute concerns.

## 2022-09-05 NOTE — ED Notes (Signed)
Called [facility] to transport patient to [facility] ?

## 2022-11-17 ENCOUNTER — Encounter (HOSPITAL_COMMUNITY): Payer: Self-pay

## 2022-11-17 ENCOUNTER — Other Ambulatory Visit: Payer: Self-pay

## 2022-11-17 ENCOUNTER — Emergency Department (HOSPITAL_COMMUNITY)
Admission: EM | Admit: 2022-11-17 | Discharge: 2022-11-17 | Disposition: A | Discharge status: Home / Self Care | Payer: Medicare (Managed Care) | Attending: Emergency Medicine | Admitting: Emergency Medicine

## 2022-11-17 ENCOUNTER — Emergency Department (HOSPITAL_COMMUNITY): Payer: Medicare (Managed Care)

## 2022-11-17 DIAGNOSIS — X58XXXA Exposure to other specified factors, initial encounter: Secondary | ICD-10-CM | POA: Insufficient documentation

## 2022-11-17 DIAGNOSIS — M25552 Pain in left hip: Secondary | ICD-10-CM | POA: Diagnosis present

## 2022-11-17 DIAGNOSIS — T83511A Infection and inflammatory reaction due to indwelling urethral catheter, initial encounter: Secondary | ICD-10-CM | POA: Diagnosis not present

## 2022-11-17 DIAGNOSIS — W19XXXA Unspecified fall, initial encounter: Secondary | ICD-10-CM

## 2022-11-17 DIAGNOSIS — N390 Urinary tract infection, site not specified: Secondary | ICD-10-CM | POA: Insufficient documentation

## 2022-11-17 DIAGNOSIS — G20A1 Parkinson's disease without dyskinesia, without mention of fluctuations: Secondary | ICD-10-CM | POA: Diagnosis not present

## 2022-11-17 DIAGNOSIS — S7002XA Contusion of left hip, initial encounter: Secondary | ICD-10-CM | POA: Diagnosis not present

## 2022-11-17 DIAGNOSIS — F0390 Unspecified dementia without behavioral disturbance: Secondary | ICD-10-CM | POA: Diagnosis not present

## 2022-11-17 LAB — URINALYSIS, ROUTINE W REFLEX MICROSCOPIC
Bilirubin Urine: NEGATIVE
Glucose, UA: NEGATIVE mg/dL
Ketones, ur: NEGATIVE mg/dL
Nitrite: NEGATIVE
Protein, ur: 100 mg/dL — AB
RBC / HPF: >50 RBC/hpf (ref 0–5)
Specific Gravity, Urine: 1.016 (ref 1.005–1.030)
pH: 8 (ref 5.0–8.0)

## 2022-11-17 LAB — CBC
HCT: 32.6 % — ABNORMAL LOW (ref 39.0–52.0)
Hemoglobin: 10.5 g/dL — ABNORMAL LOW (ref 13.0–17.0)
MCH: 27.4 pg (ref 26.0–34.0)
MCHC: 32.2 g/dL (ref 30.0–36.0)
MCV: 85.1 fL (ref 80.0–100.0)
Platelets: 172 10*3/uL (ref 150–400)
RBC: 3.83 MIL/uL — ABNORMAL LOW (ref 4.22–5.81)
RDW: 14.9 % (ref 11.5–15.5)
WBC: 8.8 10*3/uL (ref 4.0–10.5)
nRBC: 0 % (ref 0.0–0.2)

## 2022-11-17 LAB — BASIC METABOLIC PANEL
Anion gap: 13 (ref 5–15)
BUN: 28 mg/dL — ABNORMAL HIGH (ref 8–23)
CO2: 24 mmol/L (ref 22–32)
Calcium: 9.2 mg/dL (ref 8.9–10.3)
Chloride: 102 mmol/L (ref 98–111)
Creatinine, Ser: 1.55 mg/dL — ABNORMAL HIGH (ref 0.61–1.24)
GFR, Estimated: 45 mL/min — ABNORMAL LOW (ref >60)
Glucose, Bld: 105 mg/dL — ABNORMAL HIGH (ref 70–99)
Potassium: 4.4 mmol/L (ref 3.5–5.1)
Sodium: 139 mmol/L (ref 135–145)

## 2022-11-17 LAB — CBG MONITORING, ED: Glucose-Capillary: 104 mg/dL — ABNORMAL HIGH (ref 70–99)

## 2022-11-17 MED ORDER — CEPHALEXIN 500 MG PO CAPS: 500.0000 mg | ORAL_CAPSULE | Freq: Two times a day (BID) | ORAL | 0 refills | Status: DC

## 2022-11-17 MED ORDER — CEPHALEXIN 250 MG PO CAPS
500.0000 mg | ORAL_CAPSULE | Freq: Once | ORAL | Status: AC
  Administered 2022-11-17: 500 mg via ORAL
  Filled 2022-11-17: qty 2

## 2022-11-17 NOTE — ED Notes (Signed)
Patient transported to X-ray 

## 2022-11-17 NOTE — Discharge Instructions (Signed)
Take the antibiotics as prescribed.  Make an appointment to follow-up with your primary care doctor.  Return to emergency room if you have any worsening symptoms.

## 2022-11-17 NOTE — ED Triage Notes (Signed)
Pt arrives via EMS from [facility]. Pt had an unwitnessed fall at 1530, pt was found laying on the floor. Pt arrives Ax[facility] to name and dob only, which is his reported baseline. Pt also has difficulty speaking at baseline. No blood thinners. Pt seems to endorse pain in his hips.

## 2022-11-17 NOTE — ED Provider Notes (Signed)
[place] EMERGENCY DEPARTMENT AT [facility] Provider Note   CSN: [id number] Arrival date & time: 11/17/22  1609     History  Chief Complaint  Patient presents with   Theodore King is a 77 y.o. male.  Patient is a 77 year old male who presents after an unwitnessed fall.  He has a history of Parkinson's, dementia, hyperlipidemia.  It does not appear that he is on anticoagulants.  He was at his nursing facility and was found lying on the floor.  It is unclear what happened.  He has baseline confusion and difficulty with his speech which according to EMS report appears to be unchanged from his baseline.  He has been endorsing some hip pain but has not been complaining of other injuries.  This happened about 330 this afternoon.       Home Medications Prior to Admission medications   Medication Sig Start Date End Date Taking? Authorizing Provider  cephALEXin (KEFLEX) 500 MG capsule Take 1 capsule (500 mg total) by mouth 2 (two) times daily. 11/17/22  Yes Rolan Bucco, MD  albuterol (VENTOLIN HFA) 108 (90 Base) MCG/ACT inhaler Inhale 2 puffs into the lungs every 6 (six) hours as needed for wheezing.    [provider]  bisacodyl (DULCOLAX) 10 MG suppository Place 10 mg rectally daily as needed (constipation not relieved by Milk of Magnesia).    [provider]  carbidopa-levodopa (SINEMET CR) 50-200 MG tablet Take 1 tablet by mouth at bedtime. 09/24/21   Penumalli, Glenford Bayley, MD  carbidopa-levodopa (SINEMET IR) 25-100 MG tablet Take 2 tablets at 7 AM, 2 tablets at 10 AM, 2 tablets at 2 PM and 1 tablet at 6 PM Patient taking differently: Take 2 tablets by mouth See admin instructions. 2 entries on MAR: 1) 2 tablets 3 times daily (1000, 1400, 1800) 2) 2 tablets once daily (0700) 09/24/21   Penumalli, Glenford Bayley, MD  glycopyrrolate (ROBINUL) 1 MG tablet Take 1 tablet (1 mg total) by mouth 2 (two) times daily. 09/24/21   Penumalli, Glenford Bayley, MD  Magnesium  Hydroxide (MILK OF MAGNESIA PO) Take 30 mLs by mouth daily as needed (constipation).    [provider]  Nutritional Supplements (NUTRITIONAL SHAKE) LIQD Take 1 each by mouth 2 (two) times daily.    [provider]  nystatin (MYCOSTATIN/NYSTOP) powder Apply 1 Application topically 3 (three) times daily as needed (moisture associated dermatitis).    [provider]  sertraline (ZOLOFT) 25 MG tablet Take 1 tablet (25 mg total) by mouth daily. 09/24/21   Penumalli, Glenford Bayley, MD  Sodium Phosphates (ENEMA RE) Place 1 enema rectally daily as needed (constipation not relieved by bisacodyl suppository).    [provider]  traZODone (DESYREL) 50 MG tablet Take 50 mg by mouth at bedtime.    [provider]      Allergies    Patient has no known allergies.    Review of Systems   Review of Systems  Unable to perform ROS: Mental status change    Physical Exam Updated Vital Signs BP (!) 198/81   Pulse 67   Temp 97.6 F (36.4 C) (Oral)   Resp 18   Ht 6\' 1"  (1.854 m)   Wt 81.6 kg   SpO2 100%   BMI 23.75 kg/m  Physical Exam Constitutional:      Appearance: He is well-developed.  HENT:     Head: Normocephalic and atraumatic.  Eyes:     Pupils:  Pupils are equal, round, and reactive to light.  Neck:     Comments: No obvious tenderness to the cervical, thoracic or lumbosacral spine Cardiovascular:     Rate and Rhythm: Normal rate and regular rhythm.     Heart sounds: Normal heart sounds.  Pulmonary:     Effort: Pulmonary effort is normal. No respiratory distress.     Breath sounds: Normal breath sounds. No wheezing or rales.  Chest:     Chest wall: No tenderness.  Abdominal:     General: Bowel sounds are normal.     Palpations: Abdomen is soft.     Tenderness: There is no abdominal tenderness. There is no guarding or rebound.  Musculoskeletal:        General: Normal range of motion.     Comments: May be some mild discomfort on range of motion  of his left hip but otherwise they have full range of motion of his extremities without complaints of discomfort.  Lymphadenopathy:     Cervical: No cervical adenopathy.  Skin:    General: Skin is warm and dry.     Findings: No rash.  Neurological:     Mental Status: He is alert.     Comments: Oriented to person.  He does have some slurring of his speech which appears to be baseline based on chart review.  He is moving all extremities symmetrically without obvious focal deficits.     ED Results / Procedures / Treatments   Labs (all labs ordered are listed, but only abnormal results are displayed) Labs Reviewed  BASIC METABOLIC PANEL - Abnormal; Notable for the following components:      Result Value   Glucose, Bld 105 (*)    BUN 28 (*)    Creatinine, Ser 1.55 (*)    GFR, Estimated 45 (*)    All other components within normal limits  CBC - Abnormal; Notable for the following components:   RBC 3.83 (*)    Hemoglobin 10.5 (*)    HCT 32.6 (*)    All other components within normal limits  URINALYSIS, ROUTINE W REFLEX MICROSCOPIC - Abnormal; Notable for the following components:   Color, Urine AMBER (*)    APPearance CLOUDY (*)    Hgb urine dipstick MODERATE (*)    Protein, ur 100 (*)    Leukocytes,Ua LARGE (*)    Bacteria, UA MANY (*)    All other components within normal limits  CBG MONITORING, ED - Abnormal; Notable for the following components:   Glucose-Capillary 104 (*)    All other components within normal limits    EKG EKG Interpretation  Date/Time:  Wednesday November 17 2022 16:14:31 EDT Ventricular Rate:  60 PR Interval:  175 QRS Duration: 93 QT Interval:  425 QTC Calculation: 425 R Axis:   23 Text Interpretation: Sinus rhythm Abnormal R-wave progression, early transition Consider left ventricular hypertrophy Baseline wander in lead(s) V3 similar to prior EKG Confirmed by Rolan Bucco [phone]) on 11/17/2022 4:39:39 PM  Radiology CT Cervical Spine Wo  Contrast  Result Date: 11/17/2022 CLINICAL DATA:  Neck trauma (Age >= 65y) Unwitnessed fall. EXAM: CT CERVICAL SPINE WITHOUT CONTRAST TECHNIQUE: Multidetector CT imaging of the cervical spine was performed without intravenous contrast. Multiplanar CT image reconstructions were also generated. RADIATION DOSE REDUCTION: This exam was performed according to the departmental dose-optimization program which includes automated exposure control, adjustment of the mA and/or kV according to patient size and/or use of iterative reconstruction technique. COMPARISON:  02/28/2019. FINDINGS: Alignment:  Straightening of normal lordosis. No traumatic subluxation. Skull base and vertebrae: No acute fracture. Vertebral body heights are maintained. The dens and skull base are intact. Soft tissues and spinal canal: No prevertebral fluid or swelling. No visible canal hematoma. Disc levels: Moderate diffuse degenerative disc disease with disc space narrowing and spurring. Mild multilevel facet hypertrophy. No high-grade canal stenosis. Upper chest: Nonacute. Other: None IMPRESSION: 1. No acute fracture or traumatic subluxation of the cervical spine. 2. Moderate multilevel degenerative disc disease and facet hypertrophy. Electronically Signed   By: Narda Rutherford M.D.   On: 11/17/2022 17:58   CT Head Wo Contrast  Result Date: 11/17/2022 CLINICAL DATA:  Head trauma, minor (Age >= 65y) Unwitnessed fall. EXAM: CT HEAD WITHOUT CONTRAST TECHNIQUE: Contiguous axial images were obtained from the base of the skull through the vertex without intravenous contrast. RADIATION DOSE REDUCTION: This exam was performed according to the departmental dose-optimization program which includes automated exposure control, adjustment of the mA and/or kV according to patient size and/or use of iterative reconstruction technique. COMPARISON:  Head CT 09/04/2022 FINDINGS: Patient had difficulty tolerating the exam. Mild motion artifact. Brain: No  intracranial hemorrhage, mass effect, or midline shift. Stable degree of atrophy and chronic small vessel ischemia. No hydrocephalus. The basilar cisterns are patent. No evidence of territorial infarct or acute ischemia. No extra-axial or intracranial fluid collection. Vascular: Atherosclerosis of skullbase vasculature without hyperdense vessel or abnormal calcification. Skull: No fracture or focal lesion. Sinuses/Orbits: Paranasal sinuses and mastoid air cells are clear. The visualized orbits are unremarkable. Bilateral cataract resection Other: None. IMPRESSION: 1. No acute intracranial abnormality. No skull fracture. 2. Stable atrophy and chronic small vessel ischemia. Electronically Signed   By: Narda Rutherford M.D.   On: 11/17/2022 17:54   DG Hips Bilat W or Wo Pelvis 5 Views  Result Date: 11/17/2022 CLINICAL DATA:  Pain after fall EXAM: DG HIP (WITH OR WITHOUT PELVIS) 5V BILAT COMPARISON:  None Available. FINDINGS: Osteopenia. No acute fracture or dislocation. Hyperostosis. Minimal degenerative changes. Calcifications in the area of the prostate. More prominent degenerative changes along the lower lumbar spine at the edge of the imaging field. With this level of osteopenia subtle nondisplaced injury is difficult to completely exclude and if there is further concern of injury additional workup with CT as clinically directed or MRI. IMPRESSION: Osteopenia. Mild degenerative changes of the pelvis. More prominent degenerative changes of the visualized lumbar spine at the edge of the imaging field. Hyperostosis. Electronically Signed   By: Karen Kays M.D.   On: 11/17/2022 17:29    Procedures Procedures    Medications Ordered in ED Medications  cephALEXin (KEFLEX) capsule 500 mg (has no administration in time range)    ED Course/ Medical Decision Making/ A&P                             Medical Decision Making Amount and/or Complexity of Data Reviewed Labs: ordered. Radiology:  ordered.  Risk Prescription drug management.   Patient is a 77 year old who presents after unwitnessed fall.  He is awake and oriented.  No focal deficits are noted.  He has some slurred speech but per chart review, this seems to be baseline for him.  CT scan of the head and cervical spine show no acute abnormalities.  He had x-rays of both hips which were interpreted by me and confirmed by the radiologist to show no evidence of fracture.  He does not seem  to have any other injuries.  Labs were checked and are nonconcerning.  His urine does show concerns for infection.  Will start Keflex.  His blood pressure has been a bit elevated but on recheck it was 144/72.  I notified patient's daughter, Theodore King of the findings and plan discharge.  Patient was discharged in good condition back to the facility.  Return precautions were given.  {Final Clinical Impression(s) / ED Diagnoses Final diagnoses:  Fall, initial encounter  Contusion of left hip, initial encounter  Urinary tract infection associated with catheterization of urinary tract, unspecified indwelling urinary catheter type, initial encounter [facility])    Rx / DC Orders ED Discharge Orders          Ordered    cephALEXin (KEFLEX) 500 MG capsule  2 times daily        11/17/22 1941              Rolan Bucco, MD 11/17/22 1944

## 2023-01-18 ENCOUNTER — Inpatient Hospital Stay (HOSPITAL_COMMUNITY)
Admission: EM | Admit: 2023-01-18 | Discharge: 2023-01-25 | DRG: 682 | Disposition: A | Discharge status: Skilled Nursing Facility | Payer: Medicare (Managed Care) | Source: Skilled Nursing Facility | Attending: Internal Medicine | Admitting: Internal Medicine

## 2023-01-18 ENCOUNTER — Other Ambulatory Visit: Payer: Self-pay

## 2023-01-18 ENCOUNTER — Emergency Department (HOSPITAL_COMMUNITY): Payer: Medicare (Managed Care)

## 2023-01-18 ENCOUNTER — Encounter (HOSPITAL_COMMUNITY): Payer: Self-pay

## 2023-01-18 DIAGNOSIS — T83518A Infection and inflammatory reaction due to other urinary catheter, initial encounter: Secondary | ICD-10-CM | POA: Diagnosis present

## 2023-01-18 DIAGNOSIS — E872 Acidosis, unspecified: Secondary | ICD-10-CM | POA: Diagnosis present

## 2023-01-18 DIAGNOSIS — Z79899 Other long term (current) drug therapy: Secondary | ICD-10-CM

## 2023-01-18 DIAGNOSIS — Z801 Family history of malignant neoplasm of trachea, bronchus and lung: Secondary | ICD-10-CM | POA: Diagnosis not present

## 2023-01-18 DIAGNOSIS — B952 Enterococcus as the cause of diseases classified elsewhere: Secondary | ICD-10-CM | POA: Diagnosis not present

## 2023-01-18 DIAGNOSIS — N400 Enlarged prostate without lower urinary tract symptoms: Secondary | ICD-10-CM | POA: Diagnosis present

## 2023-01-18 DIAGNOSIS — T83511A Infection and inflammatory reaction due to indwelling urethral catheter, initial encounter: Secondary | ICD-10-CM | POA: Diagnosis not present

## 2023-01-18 DIAGNOSIS — N179 Acute kidney failure, unspecified: Principal | ICD-10-CM | POA: Diagnosis present

## 2023-01-18 DIAGNOSIS — R7881 Bacteremia: Secondary | ICD-10-CM | POA: Diagnosis present

## 2023-01-18 DIAGNOSIS — G20A1 Parkinson's disease without dyskinesia, without mention of fluctuations: Secondary | ICD-10-CM | POA: Diagnosis present

## 2023-01-18 DIAGNOSIS — Z87891 Personal history of nicotine dependence: Secondary | ICD-10-CM | POA: Diagnosis not present

## 2023-01-18 DIAGNOSIS — N390 Urinary tract infection, site not specified: Secondary | ICD-10-CM | POA: Diagnosis present

## 2023-01-18 DIAGNOSIS — N3001 Acute cystitis with hematuria: Secondary | ICD-10-CM | POA: Diagnosis present

## 2023-01-18 DIAGNOSIS — E785 Hyperlipidemia, unspecified: Secondary | ICD-10-CM | POA: Diagnosis present

## 2023-01-18 DIAGNOSIS — J9811 Atelectasis: Secondary | ICD-10-CM | POA: Diagnosis present

## 2023-01-18 DIAGNOSIS — R54 Age-related physical debility: Secondary | ICD-10-CM | POA: Diagnosis present

## 2023-01-18 DIAGNOSIS — Z823 Family history of stroke: Secondary | ICD-10-CM | POA: Diagnosis not present

## 2023-01-18 DIAGNOSIS — N1831 Chronic kidney disease, stage 3a: Secondary | ICD-10-CM | POA: Diagnosis present

## 2023-01-18 DIAGNOSIS — Y846 Urinary catheterization as the cause of abnormal reaction of the patient, or of later complication, without mention of misadventure at the time of the procedure: Secondary | ICD-10-CM | POA: Diagnosis present

## 2023-01-18 DIAGNOSIS — D6959 Other secondary thrombocytopenia: Secondary | ICD-10-CM | POA: Diagnosis present

## 2023-01-18 DIAGNOSIS — Z978 Presence of other specified devices: Secondary | ICD-10-CM

## 2023-01-18 DIAGNOSIS — F0280 Dementia in other diseases classified elsewhere without behavioral disturbance: Secondary | ICD-10-CM | POA: Diagnosis present

## 2023-01-18 DIAGNOSIS — G9341 Metabolic encephalopathy: Secondary | ICD-10-CM | POA: Diagnosis not present

## 2023-01-18 DIAGNOSIS — Z9359 Other cystostomy status: Secondary | ICD-10-CM

## 2023-01-18 DIAGNOSIS — Z6823 Body mass index (BMI) 23.0-23.9, adult: Secondary | ICD-10-CM

## 2023-01-18 DIAGNOSIS — I129 Hypertensive chronic kidney disease with stage 1 through stage 4 chronic kidney disease, or unspecified chronic kidney disease: Secondary | ICD-10-CM | POA: Diagnosis present

## 2023-01-18 DIAGNOSIS — E46 Unspecified protein-calorie malnutrition: Secondary | ICD-10-CM | POA: Diagnosis present

## 2023-01-18 DIAGNOSIS — Z8616 Personal history of COVID-19: Secondary | ICD-10-CM | POA: Diagnosis not present

## 2023-01-18 LAB — CBC WITH DIFFERENTIAL/PLATELET
Abs Immature Granulocytes: 0 10*3/uL (ref 0.00–0.07)
Basophils Absolute: 0 10*3/uL (ref 0.0–0.1)
Basophils Relative: 0 %
Eosinophils Absolute: 0 10*3/uL (ref 0.0–0.5)
Eosinophils Relative: 0 %
HCT: 31.8 % — ABNORMAL LOW (ref 39.0–52.0)
Hemoglobin: 10.5 g/dL — ABNORMAL LOW (ref 13.0–17.0)
Lymphocytes Relative: 2 %
Lymphs Abs: 0.3 10*3/uL — ABNORMAL LOW (ref 0.7–4.0)
MCH: 27.1 pg (ref 26.0–34.0)
MCHC: 33 g/dL (ref 30.0–36.0)
MCV: 82.2 fL (ref 80.0–100.0)
Monocytes Absolute: 0.7 10*3/uL (ref 0.1–1.0)
Monocytes Relative: 5 %
Neutro Abs: 12.8 10*3/uL — ABNORMAL HIGH (ref 1.7–7.7)
Neutrophils Relative %: 93 %
Platelets: 147 10*3/uL — ABNORMAL LOW (ref 150–400)
RBC: 3.87 MIL/uL — ABNORMAL LOW (ref 4.22–5.81)
RDW: 14.1 % (ref 11.5–15.5)
WBC: 13.8 10*3/uL — ABNORMAL HIGH (ref 4.0–10.5)
nRBC: 0 % (ref 0.0–0.2)
nRBC: 0 /100 WBC

## 2023-01-18 LAB — URINALYSIS, ROUTINE W REFLEX MICROSCOPIC
Bilirubin Urine: NEGATIVE
Glucose, UA: NEGATIVE mg/dL
Ketones, ur: NEGATIVE mg/dL
Nitrite: NEGATIVE
Protein, ur: 100 mg/dL — AB
Specific Gravity, Urine: 1.013 (ref 1.005–1.030)
WBC, UA: >50 WBC/hpf (ref 0–5)
pH: 5 (ref 5.0–8.0)

## 2023-01-18 LAB — COMPREHENSIVE METABOLIC PANEL
ALT: 5 U/L (ref 0–44)
AST: 13 U/L — ABNORMAL LOW (ref 15–41)
Albumin: 3 g/dL — ABNORMAL LOW (ref 3.5–5.0)
Alkaline Phosphatase: 84 U/L (ref 38–126)
Anion gap: 15 (ref 5–15)
BUN: 92 mg/dL — ABNORMAL HIGH (ref 8–23)
CO2: 19 mmol/L — ABNORMAL LOW (ref 22–32)
Calcium: 9 mg/dL (ref 8.9–10.3)
Chloride: 102 mmol/L (ref 98–111)
Creatinine, Ser: 5.75 mg/dL — ABNORMAL HIGH (ref 0.61–1.24)
GFR, Estimated: 9 mL/min — ABNORMAL LOW (ref >60)
Glucose, Bld: 110 mg/dL — ABNORMAL HIGH (ref 70–99)
Potassium: 4.8 mmol/L (ref 3.5–5.1)
Sodium: 136 mmol/L (ref 135–145)
Total Bilirubin: 1.7 mg/dL — ABNORMAL HIGH (ref 0.3–1.2)
Total Protein: 6.8 g/dL (ref 6.5–8.1)

## 2023-01-18 LAB — I-STAT CG4 LACTIC ACID, ED: Lactic Acid, Venous: 1 mmol/L (ref 0.5–1.9)

## 2023-01-18 MED ORDER — HEPARIN SODIUM (PORCINE) 5000 UNIT/ML IJ SOLN
5000.0000 [IU] | Freq: Three times a day (TID) | INTRAMUSCULAR | Status: DC
  Administered 2023-01-19 – 2023-01-25 (×18): 5000 [IU] via SUBCUTANEOUS
  Filled 2023-01-18 (×18): qty 1

## 2023-01-18 MED ORDER — POLYETHYLENE GLYCOL 3350 17 G PO PACK: 17.0000 g | PACK | Freq: Every day | ORAL | Status: DC | PRN

## 2023-01-18 MED ORDER — SODIUM CHLORIDE 0.9 % IV SOLN
1.0000 g | INTRAVENOUS | Status: DC
  Administered 2023-01-19: 1 g via INTRAVENOUS
  Filled 2023-01-18: qty 10

## 2023-01-18 MED ORDER — LACTATED RINGERS IV SOLN: INTRAVENOUS | Status: DC

## 2023-01-18 MED ORDER — SODIUM CHLORIDE 0.9% FLUSH
3.0000 mL | Freq: Two times a day (BID) | INTRAVENOUS | Status: DC
  Administered 2023-01-19 – 2023-01-25 (×10): 3 mL via INTRAVENOUS

## 2023-01-18 MED ORDER — SODIUM CHLORIDE 0.9 % IV SOLN
1.0000 g | Freq: Once | INTRAVENOUS | Status: AC
  Administered 2023-01-18: 1 g via INTRAVENOUS
  Filled 2023-01-18: qty 10

## 2023-01-18 MED ORDER — ACETAMINOPHEN 325 MG PO TABS: 650.0000 mg | ORAL_TABLET | Freq: Four times a day (QID) | ORAL | Status: DC | PRN

## 2023-01-18 MED ORDER — ACETAMINOPHEN 650 MG RE SUPP: 650.0000 mg | Freq: Four times a day (QID) | RECTAL | Status: DC | PRN

## 2023-01-18 MED ORDER — SODIUM CHLORIDE 0.9 % IV BOLUS
1000.0000 mL | Freq: Once | INTRAVENOUS | Status: AC
  Administered 2023-01-18: 1000 mL via INTRAVENOUS

## 2023-01-18 NOTE — Assessment & Plan Note (Signed)
Catheter asscociated. With luekocytosis, aki. F/u urine culture. I will add blood culture. Consider urology input in AM to exchange foley catheter. S/p ceftriaxone. Patient has remotely had enterobacter, but not recently. C.w. ceftriaxone.

## 2023-01-18 NOTE — ED Provider Notes (Signed)
[place] EMERGENCY DEPARTMENT AT [facility] Provider Note   CSN: [id number] Arrival date & time: 01/18/23  1817     History  Chief Complaint  Patient presents with   Weakness   Dysuria    Theodore King is a 77 y.o. male with a past medical history significant for Parkinson's disease, chronic indwelling catheter, hypertension, hyperlipidemia who presents to the ED due to generalized weakness and fatigue x 1 day.  Per living facility patient has had dark urine with sediment for the past day.  Patient had labs drawn which resulted yesterday with an elevated creatinine of 5.7.  History of dementia.  Per EMS patient is at his baseline however, is more tired than usual.  Patient denies any complaints. Level 5 caveat secondary to history of dementia  History obtained from patient and past medical records. No interpreter used during encounter.       Home Medications Prior to Admission medications   Medication Sig Start Date End Date Taking? Authorizing Provider  albuterol (VENTOLIN HFA) 108 (90 Base) MCG/ACT inhaler Inhale 2 puffs into the lungs every 6 (six) hours as needed for wheezing.    [provider]  bisacodyl (DULCOLAX) 10 MG suppository Place 10 mg rectally daily as needed (constipation not relieved by Milk of Magnesia).    [provider]  carbidopa-levodopa (SINEMET CR) 50-200 MG tablet Take 1 tablet by mouth at bedtime. 09/24/21   Penumalli, Glenford Bayley, MD  carbidopa-levodopa (SINEMET IR) 25-100 MG tablet Take 2 tablets at 7 AM, 2 tablets at 10 AM, 2 tablets at 2 PM and 1 tablet at 6 PM Patient taking differently: Take 2 tablets by mouth See admin instructions. 2 entries on MAR: 1) 2 tablets 3 times daily (1000, 1400, 1800) 2) 2 tablets once daily (0700) 09/24/21   Penumalli, Glenford Bayley, MD  cephALEXin (KEFLEX) 500 MG capsule Take 1 capsule (500 mg total) by mouth 2 (two) times daily. 11/17/22   Rolan Bucco, MD  glycopyrrolate (ROBINUL) 1 MG  tablet Take 1 tablet (1 mg total) by mouth 2 (two) times daily. 09/24/21   Penumalli, Glenford Bayley, MD  Magnesium Hydroxide (MILK OF MAGNESIA PO) Take 30 mLs by mouth daily as needed (constipation).    [provider]  Nutritional Supplements (NUTRITIONAL SHAKE) LIQD Take 1 each by mouth 2 (two) times daily.    [provider]  nystatin (MYCOSTATIN/NYSTOP) powder Apply 1 Application topically 3 (three) times daily as needed (moisture associated dermatitis).    [provider]  sertraline (ZOLOFT) 25 MG tablet Take 1 tablet (25 mg total) by mouth daily. 09/24/21   Penumalli, Glenford Bayley, MD  Sodium Phosphates (ENEMA RE) Place 1 enema rectally daily as needed (constipation not relieved by bisacodyl suppository).    [provider]  traZODone (DESYREL) 50 MG tablet Take 50 mg by mouth at bedtime.    [provider]      Allergies    Patient has no known allergies.    Review of Systems   Review of Systems  Unable to perform ROS: Dementia    Physical Exam Updated Vital Signs BP (!) 126/52   Pulse (!) 109   Temp 99.2 F (37.3 C) (Rectal)   Resp (!) 28   Ht 6\' 1"  (1.854 m)   Wt 81 kg   SpO2 100%   BMI 23.56 kg/m  Physical Exam Vitals and nursing note reviewed.  Constitutional:      General: He is not in acute  distress.    Appearance: He is not ill-appearing.  HENT:     Head: Normocephalic.  Eyes:     Pupils: Pupils are equal, round, and reactive to light.  Cardiovascular:     Rate and Rhythm: Normal rate and regular rhythm.     Pulses: Normal pulses.     Heart sounds: Normal heart sounds. No murmur heard.    No friction rub. No gallop.  Pulmonary:     Effort: Pulmonary effort is normal.     Breath sounds: Normal breath sounds.  Abdominal:     General: Abdomen is flat. There is no distension.     Palpations: Abdomen is soft.     Tenderness: There is no abdominal tenderness. There is no guarding or rebound.  Genitourinary:    Comments: GU  exam performed with chaperone in room. Indwelling catheter. Dark urine in foley bag.  Musculoskeletal:        General: Normal range of motion.     Cervical back: Neck supple.  Skin:    General: Skin is warm and dry.  Neurological:     General: No focal deficit present.     Mental Status: He is alert.  Psychiatric:        Mood and Affect: Mood normal.        Behavior: Behavior normal.     ED Results / Procedures / Treatments   Labs (all labs ordered are listed, but only abnormal results are displayed) Labs Reviewed  CBC WITH DIFFERENTIAL/PLATELET - Abnormal; Notable for the following components:      Result Value   WBC 13.8 (*)    RBC 3.87 (*)    Hemoglobin 10.5 (*)    HCT 31.8 (*)    Platelets 147 (*)    Neutro Abs 12.8 (*)    Lymphs Abs 0.3 (*)    All other components within normal limits  COMPREHENSIVE METABOLIC PANEL - Abnormal; Notable for the following components:   CO2 19 (*)    Glucose, Bld 110 (*)    BUN 92 (*)    Creatinine, Ser 5.75 (*)    Albumin 3.0 (*)    AST 13 (*)    Total Bilirubin 1.7 (*)    GFR, Estimated 9 (*)    All other components within normal limits  URINALYSIS, ROUTINE W REFLEX MICROSCOPIC - Abnormal; Notable for the following components:   APPearance TURBID (*)    Hgb urine dipstick SMALL (*)    Protein, ur 100 (*)    Leukocytes,Ua MODERATE (*)    Bacteria, UA MANY (*)    Non Squamous Epithelial 6-10 (*)    All other components within normal limits  I-STAT CG4 LACTIC ACID, ED  I-STAT CG4 LACTIC ACID, ED    EKG EKG Interpretation Date/Time:  Tuesday January 18 2023 18:31:14 EDT Ventricular Rate:  82 PR Interval:  211 QRS Duration:  88 QT Interval:  343 QTC Calculation: 401 R Axis:   22  Text Interpretation: Sinus rhythm Probable LVH with secondary repol abnrm No significant change since prior 6/24 Confirmed by Meridee Score [phone]) on 01/18/2023 6:35:09 PM  Radiology CT Renal Stone Study  Result Date: 01/18/2023 CLINICAL DATA:   Abdominal pain EXAM: CT ABDOMEN AND PELVIS WITHOUT CONTRAST TECHNIQUE: Multidetector CT imaging of the abdomen and pelvis was performed following the standard protocol without IV contrast. RADIATION DOSE REDUCTION: This exam was performed according to the departmental dose-optimization program which includes automated exposure control, adjustment of the mA and/or kV  according to patient size and/or use of iterative reconstruction technique. COMPARISON:  01/31/2022 FINDINGS: Lower chest: Small pleural effusion with associated left lower lobe atelectasis. Hepatobiliary: Unenhanced liver is unremarkable. Gallbladder is unremarkable. No intrahepatic or extrahepatic duct dilatation. Pancreas: Grossly unremarkable. Spleen: Within normal limits. Adrenals/Urinary Tract: Adrenal glands are within normal limits. Kidneys are limits. No renal, ureteral, or bladder calculi. No hydronephrosis. Bladder decompressed by an indwelling Foley catheter. Stomach/Bowel: Stomach is within normal limits. No evidence of bowel obstruction. Appendix is not discretely visualized. No colonic wall thickening or inflammatory changes. Vascular/Lymphatic: No evidence of abdominal aortic aneurysm. Atherosclerotic calcifications of the abdominal aorta and branch vessels. No suspicious abdominopelvic lymphadenopathy. Reproductive: Prostatomegaly, suggesting BPH, with dystrophic calcifications. Other: No abdominopelvic ascites. Musculoskeletal: Degenerative changes of the visualized thoracolumbar spine. IMPRESSION: No acute findings in the abdomen/pelvis. Suspected BPH. Small left pleural effusion with associated left lower lobe atelectasis. Electronically Signed   By: Charline Bills M.D.   On: 01/18/2023 23:09   DG Chest Portable 1 View  Result Date: 01/18/2023 CLINICAL DATA:  One day history of fatigue and generalized weakness EXAM: PORTABLE CHEST 1 VIEW COMPARISON:  Chest radiograph dated 08/13/2022 FINDINGS: Unchanged elevation of the left  hemidiaphragm. Low lung volumes with bronchovascular crowding. Left basilar patchy opacities. No pleural effusion or pneumothorax. The heart size and mediastinal contours are within normal limits. No acute osseous abnormality. IMPRESSION: Low lung volumes with left basilar patchy opacities, likely atelectasis. Electronically Signed   By: Agustin Cree M.D.   On: 01/18/2023 19:23    Procedures Procedures    Medications Ordered in ED Medications  cefTRIAXone (ROCEPHIN) 1 g in sodium chloride 0.9 % 100 mL IVPB (0 g Intravenous Stopped 01/18/23 2207)  sodium chloride 0.9 % bolus 1,000 mL (0 mLs Intravenous Stopped 01/18/23 2322)    ED Course/ Medical Decision Making/ A&P Clinical Course as of 01/18/23 2334  Tue Jan 18, 2023  2033 Glori Luis): MODERATE [CA]  2033 Bacteria, UA(!): MANY [CA]  2033 WBC, UA: >50 [CA]  10125 77 year old male here with dark urine increased weakness..  Patient unable to give any significant history.  Labs showing AKI with creatinine of 5 with normal potassium.  Getting urinalysis and CT, IV fluids antibiotics.  Will need admission to the hospital for further workup. [MB]  2255 Creatinine(!): 5.75 [CA]  2255 BUN(!): 92 [CA]  2255 Glucose(!): 110 [CA]    Clinical Course User Index [CA] Mannie Stabile, PA-C [MB] Terrilee Files, MD                                 Medical Decision Making Amount and/or Complexity of Data Reviewed Independent Historian: EMS Labs: ordered. Decision-making details documented in ED Course. Radiology: ordered and independent interpretation performed. Decision-making details documented in ED Course. ECG/medicine tests: ordered and independent interpretation performed. Decision-making details documented in ED Course.  Risk Decision regarding hospitalization.   This patient presents to the ED for concern of weakness, this involves an extensive number of treatment options, and is a complaint that carries with it a high risk of  complications and morbidity.  The differential diagnosis includes UTI, PNA, viral process, metabolic derangement, etc  77 year old male with a history of chronic indwelling catheter presents to the ED due to generalized weakness and fatigue for the past day.  Per living facility patient's had dark urine with sediment for the past day.  History of dementia.  Denies any  complaints.  Upon arrival patient afebrile, not tachycardic or hypoxic.  Patient in no acute distress.  Dark urine in Foley bag.  Routine labs ordered.  UA to rule out UTI.  Chest x-ray given generalized weakness.  CBC significant for mild leukocytosis at 13.8.  Anemia with hemoglobin 10.5.  CMP significant for AKI with creatinine of 5.75 and BUN at 92. Normal potassium. IV fluids started.  UA significant for moderate leukocytes, many bacteria, greater than 50 white blood cells.  CT renal study ordered.  Lactic acid normal. EKG NSR. No signs of acute ischemia. Low suspicion for ACS.  CT renal study negative for any acute abnormalities.  Suspected BPH.  Will discuss with hospitalist for admission for acute cystitis and AKI.  Discussed with Dr. Charm Barges who evaluated patient at bedside who agrees with assessment and plan.  11:34 PM Discussed with Dr. Maryjean Ka with [facility] who agrees to admit patient  Lives in living facility Hx dementia Has PCP       Final Clinical Impression(s) / ED Diagnoses Final diagnoses:  AKI (acute kidney injury) (HCC)  Acute cystitis with hematuria    Rx / DC Orders ED Discharge Orders     None         Jesusita Oka 01/18/23 2334    Terrilee Files, MD 01/19/23 [phone]

## 2023-01-18 NOTE — ED Triage Notes (Signed)
From [facility] via EMS with c/o generalized weakness and fatigue x 1 day; dark urine with sediment x 1 day; and creatinine 5.7. Hx of dementia oriented x 1 at baseline. Per staff, pt is responding per baseline but is "more tired that usual". Patient denies c/o. VSS.

## 2023-01-18 NOTE — Assessment & Plan Note (Signed)
No urologic abnormalities determined on CT renal study.  Monitor response to IV hydration at this time.  Check urine sodium and creatinine.

## 2023-01-18 NOTE — Assessment & Plan Note (Signed)
Of the left lung field, incidentally noted not associated with hypoxia, small pleural effusion at the site.  Incentive spirometry, turning position patient, CoughAssist.  Monitoring clinically

## 2023-01-18 NOTE — H&P (Signed)
History and Physical    Patient: Theodore King:[id number] DOB: 02/11/1946 DOA: 01/18/2023 DOS: the patient was seen and examined on 01/18/2023 PCP: [facility]  Patient coming from:  SNF  Chief Complaint:  Chief Complaint  Patient presents with   Weakness   Dysuria   HPI: Theodore King is a 77 y.o. male with medical history significant of dementia, resident of supervised care facility with chronic Foley.  Patient was noted to have increasing fatigue and generalized weakness over the last several days, patient had a blood drawn yesterday which revealed AKI today.  Patient is transferred to [facility], ER.  There is no report of patient having nausea vomiting diarrhea no report of fever.  Patient is unable to interact meaningfully therefore cannot give symptoms.  ER course is notable for finding of urinary tract infection, started on IV antibiotics and fluids. Review of Systems: unable to review all systems due to the inability of the patient to answer questions. 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   SOB (shortness of breath) 03/23/2018   Unspecified vitamin D deficiency    Unspecified vitamin D deficiency    Vertigo, benign paroxysmal 11/05/2016   Vitamin D deficiency 05/18/2013   Past Surgical History:  Procedure Laterality Date   APPENDECTOMY     COLONOSCOPY WITH PROPOFOL N/A 10/26/2021   Procedure: COLONOSCOPY  WITH PROPOFOL;  Surgeon: Hilarie Fredrickson, MD;  Location: [facility];  Service: Gastroenterology;  Laterality: N/A;   POLYPECTOMY  10/26/2021   Procedure: POLYPECTOMY;  Surgeon: Hilarie Fredrickson, MD;  Location: [facility];  Service: Gastroenterology;;   Mora Appl CUFF REPAIR     right   Social History:  reports that he quit smoking about 48 years ago. His smoking use included cigarettes. He started smoking about 69 years ago. He has a 10.4 pack-year smoking history. He has never used smokeless tobacco. He reports that he does not currently use alcohol. He reports that he does not use drugs.  No Known Allergies  Family History  Problem Relation Age of Onset   Cancer Mother    Cancer Father    Stroke Brother    Cancer Sister    Healthy Daughter    Cancer Sister    Lung cancer Brother    Healthy Daughter    Colon cancer Neg Hx    Esophageal cancer Neg Hx    Rectal cancer Neg Hx    Stomach cancer Neg Hx     Prior to Admission medications   Medication Sig Start Date End Date Taking? Authorizing Provider  albuterol (VENTOLIN HFA) 108 (90 Base) MCG/ACT inhaler Inhale 2 puffs into the lungs every 6 (six) hours as needed for wheezing.    [provider]  bisacodyl (DULCOLAX) 10 MG suppository Place 10 mg rectally daily as needed (constipation not relieved by Milk of Magnesia).    [provider]  carbidopa-levodopa (SINEMET CR) 50-200 MG tablet Take 1 tablet by  mouth at bedtime. 09/24/21   Penumalli, Glenford Bayley, MD  carbidopa-levodopa (SINEMET IR) 25-100 MG tablet Take 2 tablets at 7 AM, 2 tablets at 10 AM, 2 tablets at 2 PM and 1 tablet at 6 PM Patient taking differently: Take 2 tablets by mouth See admin instructions. 2 entries on MAR: 1) 2 tablets 3 times daily (1000, 1400, 1800) 2) 2 tablets once daily (0700) 09/24/21   Penumalli, Glenford Bayley, MD  cephALEXin (KEFLEX) 500 MG capsule Take 1 capsule (500 mg total) by mouth 2 (two) times daily. 11/17/22   Rolan Bucco, MD   glycopyrrolate (ROBINUL) 1 MG tablet Take 1 tablet (1 mg total) by mouth 2 (two) times daily. 09/24/21   Penumalli, Glenford Bayley, MD  Magnesium Hydroxide (MILK OF MAGNESIA PO) Take 30 mLs by mouth daily as needed (constipation).    [provider]  Nutritional Supplements (NUTRITIONAL SHAKE) LIQD Take 1 each by mouth 2 (two) times daily.    [provider]  nystatin (MYCOSTATIN/NYSTOP) powder Apply 1 Application topically 3 (three) times daily as needed (moisture associated dermatitis).    [provider]  sertraline (ZOLOFT) 25 MG tablet Take 1 tablet (25 mg total) by mouth daily. 09/24/21   Penumalli, Glenford Bayley, MD  Sodium Phosphates (ENEMA RE) Place 1 enema rectally daily as needed (constipation not relieved by bisacodyl suppository).    [provider]  traZODone (DESYREL) 50 MG tablet Take 50 mg by mouth at bedtime.    [provider]    Physical Exam: Vitals:   01/18/23 1835 01/18/23 2130 01/18/23 2219 01/18/23 2230  BP:  (!) 140/89  (!) 126/52  Pulse:  (!) 108  (!) 109  Resp:  (!) 28  (!) 28  Temp:   99.2 F (37.3 C)   TempSrc:   Rectal   SpO2:  98%  100%  Weight: 81 kg     Height: 6\' 1"  (1.854 m)      General Patient appeared sleepy on initial encounter, however woke up, makes eye contact, slowly whispered couple of words, however does not hold a conversation beyond that.  Patient's facies and appear symmetric.  Patient attempted to  raised both upper extremities to direction.  Did not follow directions with lower extremity, however withdraws to mild noxious stimulation Respiratory exam: Bilateral intravesicular Cardiovascular exam S1-S2 normal Abdomen soft nontender Extremities warm without edema GU: Foley in situ draining tan-colored urine Data Reviewed:  Labs on Admission:  Results for orders placed or performed during the hospital encounter of 01/18/23 (from the past 24 hour(s))  CBC with Differential     Status: Abnormal    Collection Time: 01/18/23  7:40 PM  Result Value Ref Range   WBC 13.8 (H) 4.0 - 10.5 K/uL   RBC 3.87 (L) 4.22 - 5.81 MIL/uL   Hemoglobin 10.5 (L) 13.0 - 17.0 g/dL   HCT 45.4 (L) 09.8 - 11.9 %   MCV 82.2 80.0 - 100.0 fL   MCH 27.1 26.0 - 34.0 pg   MCHC 33.0 30.0 - 36.0 g/dL   RDW 14.7 82.9 - 56.2 %   Platelets 147 (L) 150 - 400 K/uL   nRBC 0.0 0.0 - 0.2 %   Neutrophils Relative % 93 %   Neutro Abs 12.8 (H) 1.7 - 7.7 K/uL   Lymphocytes Relative 2 %   Lymphs Abs 0.3 (L) 0.7 - 4.0 K/uL   Monocytes Relative 5 %   Monocytes Absolute 0.7 0.1 - 1.0 K/uL   Eosinophils Relative  0 %   Eosinophils Absolute 0.0 0.0 - 0.5 K/uL   Basophils Relative 0 %   Basophils Absolute 0.0 0.0 - 0.1 K/uL   nRBC 0 0 /100 WBC   Abs Immature Granulocytes 0.00 0.00 - 0.07 K/uL  Comprehensive metabolic panel     Status: Abnormal   Collection Time: 01/18/23  7:40 PM  Result Value Ref Range   Sodium 136 135 - 145 mmol/L   Potassium 4.8 3.5 - 5.1 mmol/L   Chloride 102 98 - 111 mmol/L   CO2 19 (L) 22 - 32 mmol/L   Glucose, Bld 110 (H) 70 - 99 mg/dL   BUN 92 (H) 8 - 23 mg/dL   Creatinine, Ser 1.61 (H) 0.61 - 1.24 mg/dL   Calcium 9.0 8.9 - 09.6 mg/dL   Total Protein 6.8 6.5 - 8.1 g/dL   Albumin 3.0 (L) 3.5 - 5.0 g/dL   AST 13 (L) 15 - 41 U/L   ALT 5 0 - 44 U/L   Alkaline Phosphatase 84 38 - 126 U/L   Total Bilirubin 1.7 (H) 0.3 - 1.2 mg/dL   GFR, Estimated 9 (L) >60 mL/min   Anion gap 15 5 - 15  Urinalysis, Routine w reflex microscopic -Urine, Clean Catch     Status: Abnormal   Collection Time: 01/18/23  7:40 PM  Result Value Ref Range   Color, Urine YELLOW YELLOW   APPearance TURBID (A) CLEAR   Specific Gravity, Urine 1.013 1.005 - 1.030   pH 5.0 5.0 - 8.0   Glucose, UA NEGATIVE NEGATIVE mg/dL   Hgb urine dipstick SMALL (A) NEGATIVE   Bilirubin Urine NEGATIVE NEGATIVE   Ketones, ur NEGATIVE NEGATIVE mg/dL   Protein, ur 045 (A) NEGATIVE mg/dL   Nitrite NEGATIVE NEGATIVE   Leukocytes,Ua MODERATE (A)  NEGATIVE   RBC / HPF 0-5 0 - 5 RBC/hpf   WBC, UA >50 0 - 5 WBC/hpf   Bacteria, UA MANY (A) NONE SEEN   Squamous Epithelial / HPF 21-50 0 - 5 /HPF   WBC Clumps PRESENT    Mucus PRESENT    Non Squamous Epithelial 6-10 (A) NONE SEEN  I-Stat CG4 Lactic Acid     Status: None   Collection Time: 01/18/23  8:47 PM  Result Value Ref Range   Lactic Acid, Venous 1.0 0.5 - 1.9 mmol/L   Basic Metabolic Panel: Recent Labs  Lab 01/18/23 1940  NA 136  K 4.8  CL 102  CO2 19*  GLUCOSE 110*  BUN 92*  CREATININE 5.75*  CALCIUM 9.0   Liver Function Tests: Recent Labs  Lab 01/18/23 1940  AST 13*  ALT 5  ALKPHOS 84  BILITOT 1.7*  PROT 6.8  ALBUMIN 3.0*   No results for input(s): "LIPASE", "AMYLASE" in the last 168 hours. No results for input(s): "AMMONIA" in the last 168 hours. CBC: Recent Labs  Lab 01/18/23 1940  WBC 13.8*  NEUTROABS 12.8*  HGB 10.5*  HCT 31.8*  MCV 82.2  PLT 147*   Cardiac Enzymes: No results for input(s): "CKTOTAL", "CKMB", "CKMBINDEX", "TROPONINIHS" in the last 168 hours.  BNP (last 3 results) No results for input(s): "PROBNP" in the last 8760 hours. CBG: No results for input(s): "GLUCAP" in the last 168 hours.  Radiological Exams on Admission:  CT Renal Stone Study  Result Date: 01/18/2023 CLINICAL DATA:  Abdominal pain EXAM: CT ABDOMEN AND PELVIS WITHOUT CONTRAST TECHNIQUE: Multidetector CT imaging of the abdomen and pelvis was performed following the standard protocol without IV  contrast. RADIATION DOSE REDUCTION: This exam was performed according to the departmental dose-optimization program which includes automated exposure control, adjustment of the mA and/or kV according to patient size and/or use of iterative reconstruction technique. COMPARISON:  01/31/2022 FINDINGS: Lower chest: Small pleural effusion with associated left lower lobe atelectasis. Hepatobiliary: Unenhanced liver is unremarkable. Gallbladder is unremarkable. No intrahepatic or  extrahepatic duct dilatation. Pancreas: Grossly unremarkable. Spleen: Within normal limits. Adrenals/Urinary Tract: Adrenal glands are within normal limits. Kidneys are limits. No renal, ureteral, or bladder calculi. No hydronephrosis. Bladder decompressed by an indwelling Foley catheter. Stomach/Bowel: Stomach is within normal limits. No evidence of bowel obstruction. Appendix is not discretely visualized. No colonic wall thickening or inflammatory changes. Vascular/Lymphatic: No evidence of abdominal aortic aneurysm. Atherosclerotic calcifications of the abdominal aorta and branch vessels. No suspicious abdominopelvic lymphadenopathy. Reproductive: Prostatomegaly, suggesting BPH, with dystrophic calcifications. Other: No abdominopelvic ascites. Musculoskeletal: Degenerative changes of the visualized thoracolumbar spine. IMPRESSION: No acute findings in the abdomen/pelvis. Suspected BPH. Small left pleural effusion with associated left lower lobe atelectasis. Electronically Signed   By: Charline Bills M.D.   On: 01/18/2023 23:09   DG Chest Portable 1 View  Result Date: 01/18/2023 CLINICAL DATA:  One day history of fatigue and generalized weakness EXAM: PORTABLE CHEST 1 VIEW COMPARISON:  Chest radiograph dated 08/13/2022 FINDINGS: Unchanged elevation of the left hemidiaphragm. Low lung volumes with bronchovascular crowding. Left basilar patchy opacities. No pleural effusion or pneumothorax. The heart size and mediastinal contours are within normal limits. No acute osseous abnormality. IMPRESSION: Low lung volumes with left basilar patchy opacities, likely atelectasis. Electronically Signed   By: Agustin Cree M.D.   On: 01/18/2023 19:23    EKG: Independently reviewed. NSR   Assessment and Plan: * AKI (acute kidney injury) (HCC) No urologic abnormalities determined on CT renal study.  Monitor response to IV hydration at this time.  Check urine sodium and creatinine.  Atelectasis Of the left lung field,  incidentally noted not associated with hypoxia, small pleural effusion at the site.  Incentive spirometry, turning position patient, CoughAssist.  Monitoring clinically  UTI (urinary tract infection) Catheter asscociated. With luekocytosis, aki. F/u urine culture. I will add blood culture. Consider urology input in AM to exchange foley catheter. S/p ceftriaxone. Patient has remotely had enterobacter, but not recently. C.w. ceftriaxone.      Advance Care Planning:   Code Status: Prior full code based on documentation from SNF and ACP in epic.  Consults: consider urolgy input in AM  Family Communication: very late at this time. Will defer to AM team.  Severity of Illness: The appropriate patient status for this patient is INPATIENT. Inpatient status is judged to be reasonable and necessary in order to provide the required intensity of service to ensure the patient's safety. The patient's presenting symptoms, physical exam findings, and initial radiographic and laboratory data in the context of their chronic comorbidities is felt to place them at high risk for further clinical deterioration. Furthermore, it is not anticipated that the patient will be medically stable for discharge from the hospital within 2 midnights of admission.   * I certify that at the point of admission it is my clinical judgment that the patient will require inpatient hospital care spanning beyond 2 midnights from the point of admission due to high intensity of service, high risk for further deterioration and high frequency of surveillance required.*  Author: Nolberto Hanlon, MD 01/18/2023 11:43 PM  For on call review [email or web address].

## 2023-01-19 DIAGNOSIS — N179 Acute kidney failure, unspecified: Secondary | ICD-10-CM | POA: Diagnosis not present

## 2023-01-19 DIAGNOSIS — G9341 Metabolic encephalopathy: Secondary | ICD-10-CM | POA: Diagnosis not present

## 2023-01-19 DIAGNOSIS — N390 Urinary tract infection, site not specified: Secondary | ICD-10-CM

## 2023-01-19 LAB — GLUCOSE, CAPILLARY: Glucose-Capillary: 93 mg/dL (ref 70–99)

## 2023-01-19 MED ORDER — IPRATROPIUM-ALBUTEROL 0.5-2.5 (3) MG/3ML IN SOLN: 3.0000 mL | RESPIRATORY_TRACT | Status: DC | PRN

## 2023-01-19 MED ORDER — TRAZODONE HCL 50 MG PO TABS
50.0000 mg | ORAL_TABLET | Freq: Every day | ORAL | Status: DC
  Administered 2023-01-19 – 2023-01-24 (×6): 50 mg via ORAL
  Filled 2023-01-19 (×6): qty 1

## 2023-01-19 MED ORDER — LACTATED RINGERS IV SOLN: INTRAVENOUS | Status: DC

## 2023-01-19 MED ORDER — SERTRALINE HCL 25 MG PO TABS
25.0000 mg | ORAL_TABLET | Freq: Every day | ORAL | Status: DC
  Administered 2023-01-19 – 2023-01-25 (×7): 25 mg via ORAL
  Filled 2023-01-19 (×7): qty 1

## 2023-01-19 MED ORDER — CARBIDOPA-LEVODOPA 25-100 MG PO TABS
2.0000 | ORAL_TABLET | Freq: Four times a day (QID) | ORAL | Status: DC
  Administered 2023-01-19 – 2023-01-25 (×24): 2 via ORAL
  Filled 2023-01-19 (×24): qty 2

## 2023-01-19 MED ORDER — CARBIDOPA-LEVODOPA ER 50-200 MG PO TBCR
1.0000 | EXTENDED_RELEASE_TABLET | Freq: Every day | ORAL | Status: DC
  Administered 2023-01-19 – 2023-01-24 (×6): 1 via ORAL
  Filled 2023-01-19 (×8): qty 1

## 2023-01-19 MED ORDER — LACTATED RINGERS IV SOLN: INTRAVENOUS | Status: AC

## 2023-01-19 MED ORDER — CARBIDOPA-LEVODOPA 25-100 MG PO TABS: 2.0000 | ORAL_TABLET | ORAL | Status: DC

## 2023-01-19 MED ORDER — CHLORHEXIDINE GLUCONATE CLOTH 2 % EX PADS
6.0000 | MEDICATED_PAD | Freq: Every day | CUTANEOUS | Status: DC
  Administered 2023-01-19 – 2023-01-25 (×7): 6 via TOPICAL

## 2023-01-19 NOTE — ED Notes (Addendum)
ED TO INPATIENT HANDOFF REPORT  ED Nurse Name and Phone #: / [phone]  S Name/Age/Gender Theodore King 77 y.o. male Room/Bed: 009C/009C  Code Status   Code Status: Full Code  Home/SNF/Other Skilled nursing facility Patient oriented to: self Is this baseline? Yes   Triage Complete: Triage complete  Chief Complaint AKI (acute kidney injury) (HCC) [N17.9]  Triage Note From [facility] via EMS with c/o generalized weakness and fatigue x 1 day; dark urine with sediment x 1 day; and creatinine 5.7. Hx of dementia oriented x 1 at baseline. Per staff, pt is responding per baseline but is "more tired that usual". Patient denies c/o. VSS.    Allergies No Known Allergies  Level of Care/Admitting Diagnosis ED Disposition     ED Disposition  Admit   Condition  --   Comment  Hospital [facility]: [facility] [[id number]]  Level of Care: Telemetry Medical [104]  May [facility] to [facility] or [facility] if equivalent level of care is available:: Yes  Covid Evaluation: Asymptomatic - no recent exposure (last 10 days) testing not required  Diagnosis: AKI (acute kidney injury) [facility]) [454098]  Admitting Physician: Nolberto Hanlon [[id number]]  Attending Physician: Nolberto Hanlon [[id number]]  Certification:: I certify this patient will need inpatient services for at least 2 midnights  Expected Medical Readiness: 01/20/2023          B Medical/Surgery History 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)    Paralysis agitans 05/18/2013   Parkinson's disease    Pneumonia due to COVID-19 virus  04/08/2019   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   SOB (shortness of breath) 03/23/2018   Unspecified vitamin D deficiency    Unspecified vitamin D deficiency    Vertigo, benign paroxysmal 11/05/2016   Vitamin D deficiency 05/18/2013   Past Surgical History:  Procedure Laterality Date   APPENDECTOMY     COLONOSCOPY WITH PROPOFOL N/A 10/26/2021   Procedure: COLONOSCOPY WITH PROPOFOL;  Surgeon: Hilarie Fredrickson, MD;  Location: [facility];  Service: Gastroenterology;  Laterality: N/A;   POLYPECTOMY  10/26/2021   Procedure: POLYPECTOMY;  Surgeon: Hilarie Fredrickson, MD;  Location: [facility];  Service: Gastroenterology;;   Mora Appl CUFF REPAIR     right     A IV Location/Drains/Wounds Patient Lines/Drains/Airways Status     Active Line/Drains/Airways     Name Placement date Placement time Site Days   Peripheral IV 01/18/23 20 G Anterior;Distal;Right;Upper Arm 01/18/23  2152  Arm  1   Urethral Catheter Marline Backbone, RN Double-lumen 20 Fr. 08/05/22  2210  Double-lumen  167   Pressure Injury 01/31/22 Buttocks Mid;Lower Stage 1 -  Intact skin with non-blanchable redness of a localized area usually over a bony prominence. 01/31/22  2325  -- 353   Wound / Incision (Open or Dehisced) 01/31/22 Skin tear Thigh Distal;Left;Posterior Skin Tear: Thigh 01/31/22  2325  Thigh  353            Intake/Output Last 24 hours No intake or output data in the 24 hours ending 01/19/23 0044  Labs/Imaging Results for orders placed or performed during the hospital encounter of  01/18/23 (from the past 48 hour(s))  CBC with Differential     Status: Abnormal   Collection Time: 01/18/23  7:40 PM  Result Value Ref Range   WBC 13.8 (H) 4.0 - 10.5 K/uL   RBC 3.87 (L) 4.22 - 5.81 MIL/uL   Hemoglobin 10.5 (L) 13.0 - 17.0 g/dL   HCT 16.1 (L) 09.6 - 04.5 %   MCV 82.2 80.0 - 100.0 fL   MCH 27.1 26.0 - 34.0 pg   MCHC 33.0 30.0 - 36.0 g/dL   RDW 40.9 81.1 - 91.4 %   Platelets 147 (L) 150 - 400 K/uL    Comment:  REPEATED TO VERIFY FEW CLUMPS NOTED ON THE SLIDE THAT MIGHT FALSELY DECREASE THE COUNT.    nRBC 0.0 0.0 - 0.2 %   Neutrophils Relative % 93 %   Neutro Abs 12.8 (H) 1.7 - 7.7 K/uL   Lymphocytes Relative 2 %   Lymphs Abs 0.3 (L) 0.7 - 4.0 K/uL   Monocytes Relative 5 %   Monocytes Absolute 0.7 0.1 - 1.0 K/uL   Eosinophils Relative 0 %   Eosinophils Absolute 0.0 0.0 - 0.5 K/uL   Basophils Relative 0 %   Basophils Absolute 0.0 0.0 - 0.1 K/uL   nRBC 0 0 /100 WBC   Abs Immature Granulocytes 0.00 0.00 - 0.07 K/uL    Comment: Performed at [facility], [address] [place], [place] [address]  Comprehensive metabolic panel     Status: Abnormal   Collection Time: 01/18/23  7:40 PM  Result Value Ref Range   Sodium 136 135 - 145 mmol/L   Potassium 4.8 3.5 - 5.1 mmol/L   Chloride 102 98 - 111 mmol/L   CO2 19 (L) 22 - 32 mmol/L   Glucose, Bld 110 (H) 70 - 99 mg/dL    Comment: Glucose reference range applies only to samples taken after fasting for at least 8 hours.   BUN 92 (H) 8 - 23 mg/dL   Creatinine, Ser 6.21 (H) 0.61 - 1.24 mg/dL   Calcium 9.0 8.9 - 30.8 mg/dL   Total Protein 6.8 6.5 - 8.1 g/dL   Albumin 3.0 (L) 3.5 - 5.0 g/dL   AST 13 (L) 15 - 41 U/L   ALT 5 0 - 44 U/L   Alkaline Phosphatase 84 38 - 126 U/L   Total Bilirubin 1.7 (H) 0.3 - 1.2 mg/dL   GFR, Estimated 9 (L) >60 mL/min    Comment: (NOTE) Calculated using the CKD-EPI Creatinine Equation (2021)    Anion gap 15 5 - 15    Comment: Performed at [facility], [address] [place], [place] [address]  Urinalysis, Routine w reflex microscopic -Urine, Clean Catch     Status: Abnormal   Collection Time: 01/18/23  7:40 PM  Result Value Ref Range   Color, Urine YELLOW YELLOW   APPearance TURBID (A) CLEAR   Specific Gravity, Urine 1.013 1.005 - 1.030   pH 5.0 5.0 - 8.0   Glucose, UA NEGATIVE NEGATIVE mg/dL   Hgb urine dipstick SMALL (A) NEGATIVE   Bilirubin Urine NEGATIVE NEGATIVE   Ketones, ur NEGATIVE  NEGATIVE mg/dL   Protein, ur 696 (A) NEGATIVE mg/dL   Nitrite NEGATIVE NEGATIVE   Leukocytes,Ua MODERATE (A) NEGATIVE   RBC / HPF 0-5 0 - 5 RBC/hpf   WBC, UA >50 0 - 5 WBC/hpf   Bacteria, UA MANY (A) NONE SEEN   Squamous Epithelial / HPF 21-50 0 - 5 /  HPF   WBC Clumps PRESENT    Mucus PRESENT    Non Squamous Epithelial 6-10 (A) NONE SEEN    Comment: Performed at [facility], [address], [place], [place] [address]  I-Stat CG4 Lactic Acid     Status: None   Collection Time: 01/18/23  8:47 PM  Result Value Ref Range   Lactic Acid, Venous 1.0 0.5 - 1.9 mmol/L  CBC     Status: Abnormal   Collection Time: 01/19/23 12:16 AM  Result Value Ref Range   WBC 12.2 (H) 4.0 - 10.5 K/uL   RBC 3.52 (L) 4.22 - 5.81 MIL/uL   Hemoglobin 9.6 (L) 13.0 - 17.0 g/dL   HCT 60.4 (L) 54.0 - 98.1 %   MCV 83.0 80.0 - 100.0 fL   MCH 27.3 26.0 - 34.0 pg   MCHC 32.9 30.0 - 36.0 g/dL   RDW 19.1 47.8 - 29.5 %   Platelets 119 (L) 150 - 400 K/uL    Comment: REPEATED TO VERIFY   nRBC 0.0 0.0 - 0.2 %    Comment: Performed at [facility], [address], [place], [place] [address]  Creatinine, serum     Status: Abnormal   Collection Time: 01/19/23 12:16 AM  Result Value Ref Range   Creatinine, Ser 5.53 (H) 0.61 - 1.24 mg/dL   GFR, Estimated 10 (L) >60 mL/min    Comment: (NOTE) Calculated using the CKD-EPI Creatinine Equation (2021) Performed at [facility], [address] [place], [place] [address]    CT Renal Stone Study  Result Date: 01/18/2023 CLINICAL DATA:  Abdominal pain EXAM: CT ABDOMEN AND PELVIS WITHOUT CONTRAST TECHNIQUE: Multidetector CT imaging of the abdomen and pelvis was performed following the standard protocol without IV contrast. RADIATION DOSE REDUCTION: This exam was performed according to the departmental dose-optimization program which includes automated exposure control, adjustment of the mA and/or kV according to patient size and/or use of iterative reconstruction  technique. COMPARISON:  01/31/2022 FINDINGS: Lower chest: Small pleural effusion with associated left lower lobe atelectasis. Hepatobiliary: Unenhanced liver is unremarkable. Gallbladder is unremarkable. No intrahepatic or extrahepatic duct dilatation. Pancreas: Grossly unremarkable. Spleen: Within normal limits. Adrenals/Urinary Tract: Adrenal glands are within normal limits. Kidneys are limits. No renal, ureteral, or bladder calculi. No hydronephrosis. Bladder decompressed by an indwelling Foley catheter. Stomach/Bowel: Stomach is within normal limits. No evidence of bowel obstruction. Appendix is not discretely visualized. No colonic wall thickening or inflammatory changes. Vascular/Lymphatic: No evidence of abdominal aortic aneurysm. Atherosclerotic calcifications of the abdominal aorta and branch vessels. No suspicious abdominopelvic lymphadenopathy. Reproductive: Prostatomegaly, suggesting BPH, with dystrophic calcifications. Other: No abdominopelvic ascites. Musculoskeletal: Degenerative changes of the visualized thoracolumbar spine. IMPRESSION: No acute findings in the abdomen/pelvis. Suspected BPH. Small left pleural effusion with associated left lower lobe atelectasis. Electronically Signed   By: Charline Bills M.D.   On: 01/18/2023 23:09   DG Chest Portable 1 View  Result Date: 01/18/2023 CLINICAL DATA:  One day history of fatigue and generalized weakness EXAM: PORTABLE CHEST 1 VIEW COMPARISON:  Chest radiograph dated 08/13/2022 FINDINGS: Unchanged elevation of the left hemidiaphragm. Low lung volumes with bronchovascular crowding. Left basilar patchy opacities. No pleural effusion or pneumothorax. The heart size and mediastinal contours are within normal limits. No acute osseous abnormality. IMPRESSION: Low lung volumes with left basilar patchy opacities, likely atelectasis. Electronically Signed   By: Agustin Cree M.D.   On: 01/18/2023 19:23    Pending Labs [employer] (From  admission,  onward)     Start     Ordered   01/19/23 0500  APTT  Tomorrow morning,   R        01/18/23 2355   01/19/23 0500  Protime-INR  Tomorrow morning,   R        01/18/23 2355   01/19/23 0500  Basic metabolic panel  Tomorrow morning,   R        01/18/23 2355   01/19/23 0500  CBC  Tomorrow morning,   R        01/18/23 2355   01/18/23 2348  Creatinine, urine, random  Add-on,   AD        01/18/23 2347   01/18/23 2348  Sodium, urine, random  Add-on,   AD        01/18/23 2347   01/18/23 2347  Culture, blood (Routine X 2) w Reflex to ID Panel  BLOOD CULTURE X 2,   R (with STAT occurrences)      01/18/23 2346            Vitals/Pain Today's Vitals   01/18/23 2219 01/18/23 2230 01/18/23 2330 01/19/23 0003  BP:  (!) 126/52 (!) 123/51   Pulse:  (!) 109 82   Resp:  (!) 28 (!) 21   Temp: 99.2 F (37.3 C)   99.2 F (37.3 C)  TempSrc: Rectal   Oral  SpO2:  100% 99%   Weight:      Height:        Isolation Precautions No active isolations  Medications Medications  lactated ringers infusion ( Intravenous New Bag/Given 01/18/23 2351)  cefTRIAXone (ROCEPHIN) 1 g in sodium chloride 0.9 % 100 mL IVPB (has no administration in time range)  acetaminophen (TYLENOL) tablet 650 mg (has no administration in time range)    Or  acetaminophen (TYLENOL) suppository 650 mg (has no administration in time range)  polyethylene glycol (MIRALAX / GLYCOLAX) packet 17 g (has no administration in time range)  heparin injection 5,000 Units (has no administration in time range)  sodium chloride flush (NS) 0.9 % injection 3 mL (3 mLs Intravenous Not Given 01/19/23 0041)  carbidopa-levodopa (SINEMET CR) 50-200 MG per tablet controlled release 1 tablet (has no administration in time range)  ipratropium-albuterol (DUONEB) 0.5-2.5 (3) MG/3ML nebulizer solution 3 mL (has no administration in time range)  traZODone (DESYREL) tablet 50 mg (has no administration in time range)  sertraline (ZOLOFT) tablet 25 mg (has no  administration in time range)  carbidopa-levodopa (SINEMET IR) 25-100 MG per tablet immediate release 2 tablet (has no administration in time range)  cefTRIAXone (ROCEPHIN) 1 g in sodium chloride 0.9 % 100 mL IVPB (0 g Intravenous Stopped 01/18/23 2207)  sodium chloride 0.9 % bolus 1,000 mL (0 mLs Intravenous Stopped 01/18/23 2322)    Mobility Unknown of mobility     Focused Assessments     R Recommendations: See Admitting Provider Note  Report given to:   Additional Notes: Pt came in for weakness and dysuria from [facility]. Pt has dementia at baseline. He got rocephin for an AKI. His WBC is 12.2. He has LR going at 100. Pt has a foley in that he came with from the facility.

## 2023-01-19 NOTE — Hospital Course (Addendum)
68yom from [facility], PMH dementia, Parkinson's, chronic indwelling foley catheter, [facility] patient, presenting with generalized weakness and fatigue.  Admitted for AKI and found to have bacteremia.  Consultants ID  Procedures None

## 2023-01-19 NOTE — Assessment & Plan Note (Signed)
Likely secondary to AKI with uremia and a concern of UTI. Improving.  Patient also has advanced Parkinson's at baseline. -Continue to monitor

## 2023-01-19 NOTE — Progress Notes (Signed)
  Progress Note   Patient: Theodore King DOB: 1945-10-10 DOA: 01/18/2023     1 DOS: the patient was seen and examined on 01/19/2023   Brief hospital course: Taken from H&P.   Theodore King is a 77 y.o. male with medical history significant of dementia, resident of supervised care facility with chronic Foley.  Patient was noted to have increasing fatigue and generalized weakness over the last several days, patient had a blood drawn yesterday which revealed AKI today.  Patient is transferred to [facility], ER.   Afebrile and hemodynamically stable in ED, labs pertinent for leukocytosis at 13.6, BUN 92, creatinine 5.75 with baseline seems to be around 1.5-1.7, T. bili 1.7 and bicarb of 19. UA with leukocytosis and many bacteria, he was started on ceftriaxone for concern of UTI.  8/14: Vital stable, added urine culture as add-on.  Ordered exchange Foley catheter. Mentation seems improving.  Mild improvement to AKI    Assessment and Plan: * AKI (acute kidney injury) (HCC) No urologic abnormalities determined on CT renal study.  Monitor response to IV hydration at this time.  Check urine sodium and creatinine.  Slowly improving renal function with stable BUN and creatinine now at 5.15 from 5.73 -Continue IV fluid  Acute metabolic encephalopathy Likely secondary to AKI with uremia and a concern of UTI. Improving.  Patient also has advanced Parkinson's at baseline. -Continue to monitor  UTI (urinary tract infection) Catheter asscociated. With luekocytosis, aki. F/u urine culture-ordered as add-on.  Catheter exchange ordered.  Pending blood cultures. History of Klebsiella pneumonia UTI in February 2024, only resistant to nitrofurantoin and ampicillin. -Continue with ceftriaxone -Follow-up blood and urine cultures  Atelectasis Of the left lung field, incidentally noted not associated with hypoxia, small pleural effusion at the site.  Incentive spirometry, turning position  patient, CoughAssist.  Monitoring clinically  Parkinson's disease (HCC) -Continue home Sinemet      Subjective: Patient was awake and alert, feeling little weak and slow in response.  Denies any pain  Physical Exam: Vitals:   01/19/23 0003 01/19/23 0112 01/19/23 0505 01/19/23 0746  BP:  126/63 (!) 129/58 (!) 141/59  Pulse:  77 71 74  Resp:  16 16 16   Temp: 99.2 F (37.3 C) 98.2 F (36.8 C) 98.5 F (36.9 C) 97.6 F (36.4 C)  TempSrc: Oral Oral Oral Oral  SpO2:  98% 99% 100%  Weight:      Height:       General.  Frail and malnourished elderly man, in no acute distress. Pulmonary.  Lungs clear bilaterally, normal respiratory effort. CV.  Regular rate and rhythm, no JVD, rub or murmur. Abdomen.  Soft, nontender, nondistended, BS positive. CNS.  Alert and oriented x 2.  No focal neurologic deficit. Extremities.  No edema,  pulses intact and symmetrical. Psychiatry.  Judgment and insight appears impaired  Data Reviewed: Prior data reviewed  Family Communication: Talked with daughter on phone.  Disposition: Status is: Inpatient Remains inpatient appropriate because: Severity of illness  Planned Discharge Destination: Skilled nursing facility  DVT prophylaxis.  Subcu heparin Time spent: 45 minutes  This record has been created using Conservation officer, historic buildings. Errors have been sought and corrected,but may not always be located. Such creation errors do not reflect on the standard of care.   Author: Arnetha Courser, MD 01/19/2023 11:42 AM  For on call review [email or web address].

## 2023-01-19 NOTE — TOC Initial Note (Signed)
Transition of Care [facility]) - Initial/Assessment Note    Patient Details  Name: Theodore King MRN: [id number] Date of Birth: 1945/11/07  Transition of Care [facility]) CM/SW Contact:    Janae Bridgeman, RN Phone Number: 01/19/2023, 2:05 PM  Clinical Narrative:                 CM met with the patient at the bedside to discuss TOC needs.  The patient admitted to the hospital with AKI and likely UTI.  I called the patient's daughter since patient has memory issues and she confirmed that the patient lives at [facility] facility with [facility] involved in his care.  The patient's daughter is aware that patient is under Medicare OBservation and letter provided at the bedside.  The daughter states that patient will return to [facility]TC when stable to discharge from the hospital.  I called Quantum, CM at [facility] and she is aware of patient's admission to the hospital.  I called [name], SW at [facility] and she states that the patient missed getting his routine foley change out at the urologist in June.  [name] states that patient can return to [facility] facility when medically stable to discharge.  [place], [place] [phone] at [facility].  Expected Discharge Plan: Long Term Nursing Home Barriers to Discharge: Continued Medical Work up   Patient Goals and CMS Choice Patient states their goals for this hospitalization and ongoing recovery are:: Patient unable to state goals CMS Medicare.gov Compare Post Acute Care list provided to:: Patient Represenative (must comment) (daughter, [name] Milas Gain) Choice offered to / list presented to : [facility] POA / Guardian [place] ownership interest in [facility].provided to:: [facility] POA / Guardian    Expected Discharge Plan and Services   Discharge Planning Services: CM Consult Post Acute Care Choice: Nursing Home Living arrangements for the past 2 months: Skilled Nursing Facility ([facility]TC resident at [facility])                                       Prior Living Arrangements/Services Living arrangements for the past 2 months: Skilled Holiday representative (LTC resident at [facility]) Lives with:: Facility Resident Patient language and need for interpreter reviewed:: Yes Do you feel safe going back to the place where you live?: Yes      Need for Family Participation in Patient Care: Yes (Comment) Care giver support system in place?: Yes (comment) Current home services:  (WC at the nursing home - [facility]) Criminal Activity/Legal Involvement Pertinent to Current Situation/Hospitalization: No - Comment as needed  Activities of Daily Living Home Assistive Devices/Equipment: Other (Comment) ADL Screening (condition at time of admission) Patient's cognitive ability adequate to safely complete daily activities?: No  Permission Sought/Granted Permission sought to share information with : Case Manager, Magazine features editor, Family Supports Permission granted to share information with : Yes, Verbal Permission Granted     Permission granted to share info w AGENCY: Deontea Shill, daughter;  [name], [place] with [facility] - [phone]  Permission granted to share info w Relationship: daughter, Taysir Crummie     Emotional Assessment Appearance:: Appears stated age Attitude/Demeanor/Rapport: Gracious Affect (typically observed): Accepting Orientation: : Oriented to Self, Oriented to Place, Oriented to  Time, Oriented to Situation Alcohol / Substance Use: Not Applicable Psych Involvement: No (comment)  Admission diagnosis:  Acute  cystitis with hematuria [N30.01] AKI (acute kidney injury) [facility]) [N17.9] Patient Active Problem List   Diagnosis Date Noted   Atelectasis 01/18/2023   Catheter-associated urinary tract infection (HCC) 08/06/2022   Chronic indwelling Foley catheter    Sepsis (HCC) 04/04/2022   Pressure injury of skin 02/03/2022   Urinary retention 02/01/2022   Acute metabolic  encephalopathy 02/01/2022   Foley catheter problem, initial encounter (HCC) 01/31/2022   Pulmonary nodule 01/31/2022   Syncope 11/18/2021   UTI (urinary tract infection) 11/12/2021   Essential hypertension 11/11/2021   Bacteremia 11/10/2021   Hypokalemia 11/09/2021   Normocytic anemia 11/09/2021   Benign neoplasm of transverse colon    Abnormal CT of the abdomen    AKI (acute kidney injury) (HCC) 10/23/2021   Thrombocytopenia (HCC) 10/23/2021   BPH (benign prostatic hyperplasia) 10/23/2021   Hyperbilirubinemia 10/23/2021   Colonic mass 10/23/2021   Aortic atherosclerosis (HCC) 10/23/2021   History of 2019 novel coronavirus disease (COVID-19) 04/24/2019   Dysphonia 10/16/2018   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   Former smoker 08/15/2018   SOB (shortness of breath) 03/23/2018   Vertigo, benign paroxysmal 11/05/2016   Abnormal dreams 09/06/2013   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   Hyperglycemia 05/18/2013   Paralysis agitans 05/18/2013   Vitamin D deficiency 05/18/2013   Hyperlipidemia 05/18/2013   Parkinson's disease (HCC) 10/26/2012   PCP:  [facility], [facility]:   [id number] [place], [place] - [address] AT [facility] [address] [place] [address] Phone: [phone] Fax: [phone]  [id number] [place], [place] - [address] AT [facility] OF [facility] & [address] [place] [address] Phone: [phone] Fax: [phone]     Social Determinants of Health (SDOH) Social History: SDOH Screenings   Food Insecurity: No Food Insecurity (01/19/2023)  Housing: Low Risk  (01/19/2023)  Transportation Needs: No Transportation Needs (01/19/2023)  Utilities: Not At Risk (01/19/2023)  Tobacco Use: Medium Risk (01/18/2023)   SDOH Interventions:     Readmission Risk Interventions    01/19/2023    2:05 PM 04/08/2022     9:56 AM 04/07/2022    8:15 AM  Readmission Risk Prevention Plan  Transportation Screening Complete Complete Complete  PCP or Specialist Appt within 3-5 Days Complete    HRI or Home Care Consult Complete    Social Work Consult for Recovery Care Planning/Counseling Complete    Palliative Care Screening Complete    Medication Review Oceanographer) Complete Complete Complete  PCP or Specialist appointment within 3-5 days of discharge  Complete Complete  HRI or Home Care Consult  Complete Complete  SW Recovery Care/Counseling Consult  Complete Complete  Palliative Care Screening   Not Applicable  Skilled Nursing Facility  Not Applicable Not Applicable

## 2023-01-19 NOTE — ED Notes (Signed)
Called the floor and updated them on pt's arrival.

## 2023-01-19 NOTE — Plan of Care (Signed)

## 2023-01-19 NOTE — Assessment & Plan Note (Signed)
Continue home Sinemet °

## 2023-01-19 NOTE — Care Management Obs Status (Cosign Needed)
MEDICARE OBSERVATION STATUS NOTIFICATION   Patient Details  Name: Theodore King MRN: [id number] Date of Birth: [birth year]   Medicare Observation Status Notification Given:  Yes    Janae Bridgeman, RN 01/19/2023, 1:29 PM

## 2023-01-20 DIAGNOSIS — G9341 Metabolic encephalopathy: Secondary | ICD-10-CM | POA: Diagnosis not present

## 2023-01-20 DIAGNOSIS — N390 Urinary tract infection, site not specified: Secondary | ICD-10-CM | POA: Diagnosis not present

## 2023-01-20 DIAGNOSIS — R7881 Bacteremia: Secondary | ICD-10-CM | POA: Diagnosis present

## 2023-01-20 DIAGNOSIS — B952 Enterococcus as the cause of diseases classified elsewhere: Secondary | ICD-10-CM | POA: Diagnosis present

## 2023-01-20 DIAGNOSIS — N179 Acute kidney failure, unspecified: Secondary | ICD-10-CM | POA: Diagnosis not present

## 2023-01-20 DIAGNOSIS — G20A1 Parkinson's disease without dyskinesia, without mention of fluctuations: Secondary | ICD-10-CM | POA: Diagnosis not present

## 2023-01-20 LAB — RENAL FUNCTION PANEL
Albumin: 2.3 g/dL — ABNORMAL LOW (ref 3.5–5.0)
Anion gap: 13 (ref 5–15)
BUN: 87 mg/dL — ABNORMAL HIGH (ref 8–23)
CO2: 19 mmol/L — ABNORMAL LOW (ref 22–32)
Calcium: 8.7 mg/dL — ABNORMAL LOW (ref 8.9–10.3)
Chloride: 108 mmol/L (ref 98–111)
Creatinine, Ser: 3.87 mg/dL — ABNORMAL HIGH (ref 0.61–1.24)
GFR, Estimated: 15 mL/min — ABNORMAL LOW (ref >60)
Glucose, Bld: 115 mg/dL — ABNORMAL HIGH (ref 70–99)
Phosphorus: 3.2 mg/dL (ref 2.5–4.6)
Potassium: 4.3 mmol/L (ref 3.5–5.1)
Sodium: 140 mmol/L (ref 135–145)

## 2023-01-20 LAB — BLOOD CULTURE ID PANEL (REFLEXED) - BCID2
Enterococcus Faecium: NOT DETECTED
Klebsiella oxytoca: NOT DETECTED

## 2023-01-20 LAB — URINE CULTURE

## 2023-01-20 LAB — GLUCOSE, CAPILLARY: Glucose-Capillary: 113 mg/dL — ABNORMAL HIGH (ref 70–99)

## 2023-01-20 MED ORDER — SODIUM CHLORIDE 0.9 % IV SOLN
2.0000 g | Freq: Three times a day (TID) | INTRAVENOUS | Status: DC
  Administered 2023-01-20 – 2023-01-25 (×16): 2 g via INTRAVENOUS
  Filled 2023-01-20 (×17): qty 2000

## 2023-01-20 MED ORDER — HYDRALAZINE HCL 25 MG PO TABS
25.0000 mg | ORAL_TABLET | Freq: Four times a day (QID) | ORAL | Status: DC | PRN
  Administered 2023-01-21 – 2023-01-25 (×4): 25 mg via ORAL
  Filled 2023-01-20 (×4): qty 1

## 2023-01-20 MED ORDER — SODIUM CHLORIDE 0.45 % IV SOLN
INTRAVENOUS | Status: AC
  Filled 2023-01-20 (×2): qty 75

## 2023-01-20 NOTE — Progress Notes (Signed)
PHARMACY - PHYSICIAN COMMUNICATION CRITICAL VALUE ALERT - BLOOD CULTURE IDENTIFICATION (BCID)  Theodore King is an 77 y.o. male who presented to [facility] on 01/18/2023 with a chief complaint of weakness/fatigue   Name of physician (or Provider) Contacted: Dr. Nelson Chimes  Current antibiotics: Ceftriaxone for ?UTI  Changes to prescribed antibiotics recommended:  Add Ampicillin 2g IV q8h  Results for orders placed or performed during the hospital encounter of 01/18/23  Blood Culture ID Panel (Reflexed) (Collected: 01/18/2023 11:47 PM)  Result Value Ref Range   Enterococcus faecalis DETECTED (A) NOT DETECTED   Enterococcus Faecium NOT DETECTED NOT DETECTED   Listeria monocytogenes NOT DETECTED NOT DETECTED   Staphylococcus species NOT DETECTED NOT DETECTED   Staphylococcus aureus (BCID) NOT DETECTED NOT DETECTED   Staphylococcus epidermidis NOT DETECTED NOT DETECTED   Staphylococcus lugdunensis NOT DETECTED NOT DETECTED   Streptococcus species NOT DETECTED NOT DETECTED   Streptococcus agalactiae NOT DETECTED NOT DETECTED   Streptococcus pneumoniae NOT DETECTED NOT DETECTED   Streptococcus pyogenes NOT DETECTED NOT DETECTED   A.calcoaceticus-baumannii NOT DETECTED NOT DETECTED   Bacteroides fragilis NOT DETECTED NOT DETECTED   Enterobacterales NOT DETECTED NOT DETECTED   Enterobacter cloacae complex NOT DETECTED NOT DETECTED   Escherichia coli NOT DETECTED NOT DETECTED   Klebsiella aerogenes NOT DETECTED NOT DETECTED   Klebsiella oxytoca NOT DETECTED NOT DETECTED   Klebsiella pneumoniae NOT DETECTED NOT DETECTED   Proteus species NOT DETECTED NOT DETECTED   Salmonella species NOT DETECTED NOT DETECTED   Serratia marcescens NOT DETECTED NOT DETECTED   Haemophilus influenzae NOT DETECTED NOT DETECTED   Neisseria meningitidis NOT DETECTED NOT DETECTED   Pseudomonas aeruginosa NOT DETECTED NOT DETECTED   Stenotrophomonas maltophilia NOT DETECTED NOT DETECTED   Candida albicans NOT  DETECTED NOT DETECTED   Candida auris NOT DETECTED NOT DETECTED   Candida glabrata NOT DETECTED NOT DETECTED   Candida krusei NOT DETECTED NOT DETECTED   Candida parapsilosis NOT DETECTED NOT DETECTED   Candida tropicalis NOT DETECTED NOT DETECTED   Cryptococcus neoformans/gattii NOT DETECTED NOT DETECTED   Vancomycin resistance NOT DETECTED NOT DETECTED    Abran Duke, PharmD, BCPS Clinical Pharmacist Phone: [phone]

## 2023-01-20 NOTE — Assessment & Plan Note (Signed)
No urologic abnormalities determined on CT renal study.  Monitor response to IV hydration at this time.  Check urine sodium and creatinine.   Improving renal function, creatinine at 3.87 today. Assistant but stable metabolic acidosis likely secondary to renal dysfunction -Continue IV fluid-switched with bicarb infusion for 1 more day -Monitor renal function

## 2023-01-20 NOTE — Consult Note (Signed)
[facility]    Date of Admission:  01/18/2023     Total days of antibiotics 2   Ampicillin 8/15 >>  Ceftriaxone x 2               Reason for Consult: enterococcal bacteremia     Referring Provider: Josie   Primary Care Provider: [facility]   Assessment: Theodore King is a 77 y.o. male admitted from memory care facility for evaluation of recently worsening weakness and lethargy. Found to have enterococcus faecalis bacteremia in 1/2 sites.   Enterococcus Faecalis Bacteremia -  -He has suprapubic bladder tenderness on exam in the setting of chronic foley catheter. Suspect this is UTI related bacteremia. He did have a history of enterococcal + enterobacter bacteremia in Oct 2023 but that was quite some time ago and seems unrelated to current episode (also then deemed urine related).  -According to Denovo score (1) risk for endocarditis seems low and would not consider TEE in this patient. Will check with Dr. Daiva Eves as to whether he would like to order TTE as part of work up.  -will repeat blood cultures in AM to ensure clearance.    Dementia- Parkinson's- -reported to be stable at baseline, more advanced. He was able to demonstrate simple communication today, though not a great understanding of current problem.  -per primary team  Chronic Foley Catheter - -s/p exchange 8/15. Likely contributing to source of bacteremia.   AKI -  Baseline Cr  -Trending down with Cr today 5.15.    Plan: Stop ceftriaxone Continue ampicillin  Defer TEE with low Denovo score of 1 Repeat bcx tomorrow. Hopeful for a short course of oral therapy to treat this for him.     Principal Problem:   AKI (acute kidney injury) (HCC) Active Problems:   Parkinson's disease (HCC)   UTI (urinary tract infection)   Acute metabolic encephalopathy   Atelectasis    carbidopa-levodopa  1 tablet Oral QHS    carbidopa-levodopa  2 tablet Oral QID   Chlorhexidine Gluconate Cloth  6 each Topical Daily   heparin  5,000 Units Subcutaneous Q8H   sertraline  25 mg Oral Daily   sodium chloride flush  3 mL Intravenous Q12H   traZODone  50 mg Oral QHS    HPI: Theodore King is a 77 y.o. male admitted for evaluation of weakness and fatigue per SNF facility staff.   PMHx:  Dementia (not very verbal per chart review), AS, BPH, HLD, Parkinson's disease). Multiple ER visits thsi year for AMS/Falls and complications a/w dementia.   Recent Hx: Provided by facility and obtained in chart review - resident of memory care facility. Noted to have increased fatigue and weakness that has worsened over the last few days. Found to have AKI on labs and was referred to ER for further care. No GI/GU symptoms and no measured fevers.   ER noted to have abnormal U/A in the setting of chronic foley catheter. Leukcytosis 13.7K, urine and blood cultures were sent and started on empiric ceftriaxone for presumed UTI.   Foley catheter has been exchanged. Improved AKI (5.75 >> 5.15).   Review of Systems: Review of Systems  Constitutional:  Negative for chills and fever.  Gastrointestinal:  Positive for abdominal pain.    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   SOB (shortness of breath) 03/23/2018   Unspecified vitamin D deficiency    Unspecified vitamin D deficiency    Vertigo, benign  paroxysmal 11/05/2016   Vitamin D deficiency 05/18/2013    Social History   Tobacco Use   Smoking status: Former    Current packs/day: 0.00    Average packs/day: 0.5 packs/day for 20.8 years (10.4 ttl pk-yrs)    Types: Cigarettes    Start date: 08/14/1953    Quit date: 05/23/1974    Years since quitting: 48.6   Smokeless tobacco: Never  Vaping Use   Vaping status: Never Used  Substance Use Topics   Alcohol use: Not Currently   Drug use: No    Family History  Problem Relation Age of Onset   Cancer Mother    Cancer Father    Stroke Brother    Cancer Sister    Healthy Daughter    Cancer Sister    Lung cancer Brother    Healthy Daughter    Colon cancer Neg Hx    Esophageal cancer Neg Hx    Rectal cancer Neg Hx    Stomach cancer Neg Hx    No Known Allergies  OBJECTIVE: Blood pressure (!) 156/59, pulse 70, temperature 97.9 F (36.6 C), resp. rate 16, height 6\' 1"  (1.854 m), weight 81 kg, SpO2 100%.  Physical Exam Vitals reviewed.  Constitutional:      General: He is not in acute distress.    Appearance: Normal appearance. He is not ill-appearing.  Cardiovascular:     Rate and Rhythm: Normal rate and regular rhythm.  Pulmonary:     Effort: Pulmonary effort is normal.     Breath sounds: Normal breath sounds.  Abdominal:     Tenderness: There is abdominal tenderness (suprapubic tenderness).  Skin:    General: Skin is warm and dry.     Capillary Refill: Capillary refill takes less than 2 seconds.  Neurological:     Mental Status: He is oriented to person, place, and time.     Lab Results Lab Results  Component Value Date   WBC 12.4 (H) 01/19/2023   HGB 10.8 (L) 01/19/2023   HCT 32.9 (L) 01/19/2023   MCV 84.4 01/19/2023   PLT 129 (L) 01/19/2023    Lab Results  Component Value Date   CREATININE 5.15 (H) 01/19/2023   BUN 92 (H) 01/19/2023   NA 139 01/19/2023   K 4.4 01/19/2023   CL 107 01/19/2023   CO2 20 (L) 01/19/2023    Lab Results  Component  Value Date   ALT 5 01/18/2023   AST 13 (L) 01/18/2023   ALKPHOS 84 01/18/2023   BILITOT 1.7 (H) 01/18/2023     Microbiology: Recent Results (from the past 240 hour(s))  Culture, blood (Routine X 2) w Reflex to ID Panel     Status: None (Preliminary result)   Collection Time: 01/18/23 11:47 PM   Specimen: BLOOD  Result Value Ref Range Status   Specimen Description BLOOD SITE NOT SPECIFIED  Final   Special Requests  Final    BOTTLES DRAWN AEROBIC AND ANAEROBIC Blood Culture results may not be optimal due to an excessive volume of blood received in culture bottles   Culture  Setup Time   Final    GRAM POSITIVE COCCI AEROBIC BOTTLE ONLY CRITICAL RESULT CALLED TO, READ BACK BY AND VERIFIED WITH: J LEDFORD,[email or web address]@[id number]  01/20/23 MK Performed at [facility], [address], [place], [place] [address]    Culture GRAM POSITIVE COCCI  Final   Report Status PENDING  Incomplete  Blood Culture ID Panel (Reflexed)     Status: Abnormal   Collection Time: 01/18/23 11:47 PM  Result Value Ref Range Status   Enterococcus faecalis DETECTED (A) NOT DETECTED Final    Comment: CRITICAL RESULT CALLED TO, READ BACK BY AND VERIFIED WITH: J LEDFORD,[email or web address]@[id number]  01/20/23 MK    Enterococcus Faecium NOT DETECTED NOT DETECTED Final   Listeria monocytogenes NOT DETECTED NOT DETECTED Final   Staphylococcus species NOT DETECTED NOT DETECTED Final   Staphylococcus aureus (BCID) NOT DETECTED NOT DETECTED Final   Staphylococcus epidermidis NOT DETECTED NOT DETECTED Final   Staphylococcus lugdunensis NOT DETECTED NOT DETECTED Final   Streptococcus species NOT DETECTED NOT DETECTED Final   Streptococcus agalactiae NOT DETECTED NOT DETECTED Final   Streptococcus pneumoniae NOT DETECTED NOT DETECTED Final   Streptococcus pyogenes NOT DETECTED NOT DETECTED Final   A.calcoaceticus-baumannii NOT DETECTED NOT DETECTED Final   Bacteroides fragilis NOT DETECTED NOT DETECTED Final   Enterobacterales NOT DETECTED NOT  DETECTED Final   Enterobacter cloacae complex NOT DETECTED NOT DETECTED Final   Escherichia coli NOT DETECTED NOT DETECTED Final   Klebsiella aerogenes NOT DETECTED NOT DETECTED Final   Klebsiella oxytoca NOT DETECTED NOT DETECTED Final   Klebsiella pneumoniae NOT DETECTED NOT DETECTED Final   Proteus species NOT DETECTED NOT DETECTED Final   Salmonella species NOT DETECTED NOT DETECTED Final   Serratia marcescens NOT DETECTED NOT DETECTED Final   Haemophilus influenzae NOT DETECTED NOT DETECTED Final   Neisseria meningitidis NOT DETECTED NOT DETECTED Final   Pseudomonas aeruginosa NOT DETECTED NOT DETECTED Final   Stenotrophomonas maltophilia NOT DETECTED NOT DETECTED Final   Candida albicans NOT DETECTED NOT DETECTED Final   Candida auris NOT DETECTED NOT DETECTED Final   Candida glabrata NOT DETECTED NOT DETECTED Final   Candida krusei NOT DETECTED NOT DETECTED Final   Candida parapsilosis NOT DETECTED NOT DETECTED Final   Candida tropicalis NOT DETECTED NOT DETECTED Final   Cryptococcus neoformans/gattii NOT DETECTED NOT DETECTED Final   Vancomycin resistance NOT DETECTED NOT DETECTED Final    Comment: Performed at [facility], [address], [place], [place] [address]  Culture, blood (Routine X 2) w Reflex to ID Panel     Status: None (Preliminary result)   Collection Time: 01/19/23  9:48 AM   Specimen: BLOOD LEFT FOREARM  Result Value Ref Range Status   Specimen Description BLOOD LEFT FOREARM  Final   Special Requests   Final    BOTTLES DRAWN AEROBIC AND ANAEROBIC Blood Culture adequate volume   Culture   Final    NO GROWTH < 24 HOURS Performed at [facility], [address] [place], [place] [address]    Report Status PENDING  Incomplete     Rexene Alberts, MSN, NP-C [facility]  [email or web address] Pager: [phone] Office: [phone] [facility] Main Line: [phone] *Secure Chat  Communication Welcome

## 2023-01-20 NOTE — Assessment & Plan Note (Signed)
Likely secondary to AKI with uremia and a concern of UTI. Improving.  Patient also has advanced Parkinson's at baseline. -Continue to monitor

## 2023-01-20 NOTE — Plan of Care (Signed)

## 2023-01-20 NOTE — Assessment & Plan Note (Signed)
Blood cultures growing Enterococcus faecalis likely secondary to UTI but urine cultures with multiple species. -Antibiotics switched with ampicillin -Repeat blood cultures tomorrow

## 2023-01-20 NOTE — Assessment & Plan Note (Signed)
Catheter asscociated. With luekocytosis, aki. History of Klebsiella pneumonia UTI in February 2024, only resistant to nitrofurantoin and ampicillin. Catheter was exchanged, repeat cultures with multiple species but blood cultures growing Enterococcus faecalis -Antibiotics switched with ampicillin

## 2023-01-20 NOTE — Progress Notes (Signed)
Progress Note   Patient: Theodore King ZOX:[id number] DOB: 02/27/1946 DOA: 01/18/2023     2 DOS: the patient was seen and examined on 01/20/2023   Brief hospital course: Taken from H&P.   Theodore King is a 77 y.o. male with medical history significant of dementia, resident of supervised care facility with chronic Foley.  Patient was noted to have increasing fatigue and generalized weakness over the last several days, patient had a blood drawn yesterday which revealed AKI today.  Patient is transferred to [facility], ER.   Afebrile and hemodynamically stable in ED, labs pertinent for leukocytosis at 13.6, BUN 92, creatinine 5.75 with baseline seems to be around 1.5-1.7, T. bili 1.7 and bicarb of 19. UA with leukocytosis and many bacteria, he was started on ceftriaxone for concern of UTI.  8/14: Vital stable, added urine culture as add-on.  Ordered exchange Foley catheter. Mentation seems improving.  Mild improvement to AKI  8/15: Blood cultures growing Enterococcus faecalis, antibiotics switched with ampicillin.   Likely urinary source with pending urine cultures.  Improving renal function, persistent metabolic acidosis likely secondary to renal dysfunction, giving bicarb infusion for 24-hour.   Assessment and Plan: * AKI (acute kidney injury) (HCC) No urologic abnormalities determined on CT renal study.  Monitor response to IV hydration at this time.  Check urine sodium and creatinine.   Improving renal function, creatinine at 3.87 today. Assistant but stable metabolic acidosis likely secondary to renal dysfunction -Continue IV fluid-switched with bicarb infusion for 1 more day -Monitor renal function  Acute metabolic encephalopathy Likely secondary to AKI with uremia and a concern of UTI. Improving.  Patient also has advanced Parkinson's at baseline. -Continue to monitor  Bacteremia Blood cultures growing Enterococcus faecalis likely secondary to UTI but urine cultures with  multiple species. -Antibiotics switched with ampicillin -Repeat blood cultures tomorrow  UTI (urinary tract infection) Catheter asscociated. With luekocytosis, aki. History of Klebsiella pneumonia UTI in February 2024, only resistant to nitrofurantoin and ampicillin. Catheter was exchanged, repeat cultures with multiple species but blood cultures growing Enterococcus faecalis -Antibiotics switched with ampicillin   Atelectasis Of the left lung field, incidentally noted not associated with hypoxia, small pleural effusion at the site.  Incentive spirometry, turning position patient, CoughAssist.  Monitoring clinically  Parkinson's disease (HCC) -Continue home Sinemet      Subjective: Patient was seen and examined today.  No new concern.  Remained very slow to response which is his baseline due to Parkinson's.  Physical Exam: Vitals:   01/19/23 2007 01/19/23 2356 01/20/23 0509 01/20/23 0804  BP: (!) 139/52 (!) 167/68 (!) 189/78 (!) 156/59  Pulse: 68 81 70 70  Resp: 18 18 18 16   Temp: (!) 97.2 F (36.2 C)  99.6 F (37.6 C) 97.9 F (36.6 C)  TempSrc:      SpO2: 98% 96% 100% 100%  Weight:      Height:       General.  Frail and malnourished elderly man, in no acute distress. Pulmonary.  Lungs clear bilaterally, normal respiratory effort. CV.  Regular rate and rhythm, no JVD, rub or murmur. Abdomen.  Soft, nontender, nondistended, BS positive. CNS.  Alert and oriented .  No focal neurologic deficit. Extremities.  No edema, no cyanosis, pulses intact and symmetrical.    Data Reviewed: Prior data reviewed  Family Communication: Talked with daughter on phone.  Disposition: Status is: Inpatient Remains inpatient appropriate because: Severity of illness  Planned Discharge Destination: Skilled nursing facility  DVT prophylaxis.  Subcu heparin Time spent: 44 minutes  This record has been created using Conservation officer, historic buildings. Errors have been sought and  corrected,but may not always be located. Such creation errors do not reflect on the standard of care.   Author: Arnetha Courser, MD 01/20/2023 2:27 PM  For on call review [email or web address].

## 2023-01-21 DIAGNOSIS — B952 Enterococcus as the cause of diseases classified elsewhere: Secondary | ICD-10-CM

## 2023-01-21 DIAGNOSIS — R7881 Bacteremia: Secondary | ICD-10-CM | POA: Diagnosis not present

## 2023-01-21 DIAGNOSIS — T83511A Infection and inflammatory reaction due to indwelling urethral catheter, initial encounter: Secondary | ICD-10-CM | POA: Diagnosis not present

## 2023-01-21 DIAGNOSIS — N179 Acute kidney failure, unspecified: Secondary | ICD-10-CM | POA: Diagnosis not present

## 2023-01-21 DIAGNOSIS — G20A1 Parkinson's disease without dyskinesia, without mention of fluctuations: Secondary | ICD-10-CM

## 2023-01-21 DIAGNOSIS — G9341 Metabolic encephalopathy: Secondary | ICD-10-CM | POA: Diagnosis not present

## 2023-01-21 LAB — RENAL FUNCTION PANEL
Albumin: 2.3 g/dL — ABNORMAL LOW (ref 3.5–5.0)
Anion gap: 10 (ref 5–15)
BUN: 84 mg/dL — ABNORMAL HIGH (ref 8–23)
CO2: 21 mmol/L — ABNORMAL LOW (ref 22–32)
Calcium: 8.5 mg/dL — ABNORMAL LOW (ref 8.9–10.3)
Chloride: 110 mmol/L (ref 98–111)
Creatinine, Ser: 3.37 mg/dL — ABNORMAL HIGH (ref 0.61–1.24)
GFR, Estimated: 18 mL/min — ABNORMAL LOW (ref >60)
Glucose, Bld: 109 mg/dL — ABNORMAL HIGH (ref 70–99)
Phosphorus: 3.5 mg/dL (ref 2.5–4.6)
Potassium: 4 mmol/L (ref 3.5–5.1)
Sodium: 141 mmol/L (ref 135–145)

## 2023-01-21 NOTE — Plan of Care (Signed)
  Problem: Clinical Measurements: Goal: Ability to maintain clinical measurements within normal limits will improve Outcome: Progressing Goal: Will remain free from infection Outcome: Progressing Goal: Diagnostic test results will improve Outcome: Progressing Goal: Respiratory complications will improve Outcome: Progressing Goal: Cardiovascular complication will be avoided Outcome: Progressing   Problem: Clinical Measurements: Goal: Will remain free from infection Outcome: Progressing   Problem: Activity: Goal: Risk for activity intolerance will decrease Outcome: Progressing   Problem: Nutrition: Goal: Adequate nutrition will be maintained Outcome: Progressing   Problem: Coping: Goal: Level of anxiety will decrease Outcome: Progressing   Problem: Elimination: Goal: Will not experience complications related to bowel motility Outcome: Progressing Goal: Will not experience complications related to urinary retention Outcome: Progressing   Problem: Pain Managment: Goal: General experience of comfort will improve Outcome: Progressing   Problem: Safety: Goal: Ability to remain free from injury will improve Outcome: Progressing   Problem: Skin Integrity: Goal: Risk for impaired skin integrity will decrease Outcome: Progressing

## 2023-01-21 NOTE — Progress Notes (Signed)
  Progress Note   Patient: Theodore King RUE:[id number] DOB: 1946/01/22 DOA: 01/18/2023     3 DOS: the patient was seen and examined on 01/21/2023   Brief hospital course: 68yom from [place], PMH dementia, Parkinson's, chronic indwelling foley catheter, [facility] patient, presenting with generalized weakness and fatigue.  Admitted for AKI and found to have bacteremia.  Consultants ID  Procedures None  Assessment and Plan: * AKI superimposed on CKD stage IIIa Mild metabolic acidosis No urologic abnormalities on CT renal study.   Creatinine down to 3.37, baseline 1.5 Continue IV fluids, recheck BMP in a.m.  Enterococcal bacteremia Presumed enterococcal UTI, catheter associated.  Chronic indwelling Foley catheter. Blood cultures growing Enterococcus faecalis likely secondary to UTI but urine cultures with multiple species. Catheter was exchanged. Afebrile, vital signs stable.  Continue antibiotics per ID.  Follow-up TTE. If no concerns will have him complete course PO high dose amox 1 gm TID for 2 weeks.    Acute metabolic encephalopathy Appears resolved.  Likely secondary to AKI, uremia.      Anemia normocytic, chronic Appears stable  Thrombocytopenia  Appears stable.  Probably secondary to infection.  Parkinson's disease (HCC) -Continue home Sinemet   Likely home in 48 hours.  Subjective:  Feels ok  Physical Exam: Vitals:   01/21/23 0015 01/21/23 0458 01/21/23 0752 01/21/23 0758  BP: (!) 150/60 (!) 147/57  (!) 138/57  Pulse: 65 65 82 66  Resp:  18 19 19   Temp:  98.6 F (37 C)  98.1 F (36.7 C)  TempSrc:  Oral  Oral  SpO2:  100% 94% 99%  Weight:      Height:       Physical Exam Vitals reviewed.  Constitutional:      General: He is not in acute distress.    Appearance: He is not ill-appearing or toxic-appearing.  Cardiovascular:     Rate and Rhythm: Normal rate and regular rhythm.     Heart sounds: No murmur heard. Pulmonary:     Effort: Pulmonary effort  is normal. No respiratory distress.     Breath sounds: No wheezing, rhonchi or rales.  Neurological:     Mental Status: He is alert.  Psychiatric:        Mood and Affect: Mood normal.        Behavior: Behavior normal.     Data Reviewed: Creatinine down to 3.37   Family Communication: none  Disposition: Status is: Inpatient Remains inpatient appropriate because: bacteremia, AI     Time spent: 20 minutes  Author: Brendia Sacks, MD 01/21/2023 1:22 PM  For on call review [email or web address].

## 2023-01-21 NOTE — TOC Progression Note (Addendum)
Transition of Care [facility]) - Progression Note    Patient Details  Name: Theodore King MRN: [id number] Date of Birth: 1946-05-19  [facility] CM/SW Contact  Janae Bridgeman, RN Phone Number: 01/21/2023, 10:13 AM  Clinical Narrative:    CM called and spoke with [name], [place] [phone] with [facility] and I updated her regarding patient's continued IV antibiotics, NaBicarb and likely repeat for cultures today.  [facility] will need to be contacted once patient is medically stable for transportation assistance back to [facility].  8/16- 1521 - MD notes states that patient will be medically stable to return to [facility] on Sunday.  I called [facility] and left a message updating that patient may need WC transport back to the facility Sunday.  [facility] also states that they have saved his bed at the facility and he can return if stable on Sunday.   Expected Discharge Plan: Long Term Nursing Home Barriers to Discharge: Continued Medical Work up  Expected Discharge Plan and Services   Discharge Planning Services: CM Consult Post Acute Care Choice: Nursing Home Living arrangements for the past 2 months: Skilled Nursing Facility (LTC resident at [facility])                                       Social Determinants of Health (SDOH) Interventions SDOH Screenings   Food Insecurity: No Food Insecurity (01/19/2023)  Housing: Low Risk  (01/19/2023)  Transportation Needs: No Transportation Needs (01/19/2023)  Utilities: Not At Risk (01/19/2023)  Tobacco Use: Medium Risk (01/18/2023)    Readmission Risk Interventions    01/19/2023    2:05 PM 04/08/2022    9:56 AM 04/07/2022    8:15 AM  Readmission Risk Prevention Plan  Transportation Screening Complete Complete Complete  PCP or Specialist Appt within 3-5 Days Complete    HRI or Home Care Consult Complete    Social Work Consult for Recovery Care Planning/Counseling Complete     Palliative Care Screening Complete    Medication Review Oceanographer) Complete Complete Complete  PCP or Specialist appointment within 3-5 days of discharge  Complete Complete  HRI or Home Care Consult  Complete Complete  SW Recovery Care/Counseling Consult  Complete Complete  Palliative Care Screening   Not Applicable  Skilled Nursing Facility  Not Applicable Not Applicable

## 2023-01-21 NOTE — Care Management Important Message (Signed)
Important Message  Patient Details  Name: Theodore King MRN: [id number] Date of Birth: [birth year]   Medicare Important Message Given:  Yes     [name] Stefan Church 01/21/2023, 2:44 PM

## 2023-01-21 NOTE — Plan of Care (Signed)

## 2023-01-21 NOTE — Progress Notes (Signed)
Brief ID Note:   TTE ordered - pending study and read.   If no concerns will have him complete course PO high dose amox 1 gm TID for 2 weeks.   Dr. Renold Don will follow remotely over w/e and will be available for questions.    Rexene Alberts, MSN, NP-C [facility] for Infectious Disease [facility]  [email or web address] Pager: [phone] Office: [phone] [facility] Main Line: [phone] *Secure Chat Communication Welcome

## 2023-01-22 ENCOUNTER — Inpatient Hospital Stay (HOSPITAL_COMMUNITY): Payer: Medicare (Managed Care)

## 2023-01-22 DIAGNOSIS — R7881 Bacteremia: Secondary | ICD-10-CM | POA: Diagnosis not present

## 2023-01-22 DIAGNOSIS — B952 Enterococcus as the cause of diseases classified elsewhere: Secondary | ICD-10-CM | POA: Diagnosis not present

## 2023-01-22 DIAGNOSIS — N179 Acute kidney failure, unspecified: Secondary | ICD-10-CM | POA: Diagnosis not present

## 2023-01-22 LAB — ECHOCARDIOGRAM COMPLETE
AR max vel: 2.54 cm2
AV Area VTI: 2.49 cm2
AV Area mean vel: 2.32 cm2
AV Mean grad: 6 mmHg
AV Peak grad: 11.3 mmHg
AV Vena cont: 0.9 cm
Ao pk vel: 1.68 m/s
Area-P 1/2: 2.66 cm2
Height: 73 in
P 1/2 time: 728 ms
S' Lateral: 3 cm
Weight: 3044.11 [oz_av]

## 2023-01-22 LAB — RENAL FUNCTION PANEL
Albumin: 2.2 g/dL — ABNORMAL LOW (ref 3.5–5.0)
Anion gap: 9 (ref 5–15)
BUN: 72 mg/dL — ABNORMAL HIGH (ref 8–23)
CO2: 21 mmol/L — ABNORMAL LOW (ref 22–32)
Calcium: 8.3 mg/dL — ABNORMAL LOW (ref 8.9–10.3)
Chloride: 110 mmol/L (ref 98–111)
Creatinine, Ser: 3.05 mg/dL — ABNORMAL HIGH (ref 0.61–1.24)
GFR, Estimated: 20 mL/min — ABNORMAL LOW (ref >60)
Glucose, Bld: 120 mg/dL — ABNORMAL HIGH (ref 70–99)
Phosphorus: 3.4 mg/dL (ref 2.5–4.6)
Potassium: 3.8 mmol/L (ref 3.5–5.1)
Sodium: 140 mmol/L (ref 135–145)

## 2023-01-22 LAB — CULTURE, BLOOD (ROUTINE X 2)

## 2023-01-22 MED ORDER — LACTATED RINGERS IV SOLN: INTRAVENOUS | Status: DC

## 2023-01-22 NOTE — Plan of Care (Signed)

## 2023-01-22 NOTE — Progress Notes (Signed)
*  PRELIMINARY RESULTS* Echocardiogram 2D Echocardiogram has been performed.  Theodore King 01/22/2023, 8:50 AM

## 2023-01-22 NOTE — Progress Notes (Signed)
  Progress Note   Patient: Theodore King [id number] DOB: 12-05-1945 DOA: 01/18/2023     4 DOS: the patient was seen and examined on 01/22/2023   Brief hospital course: 68yom from [place], PMH dementia, Parkinson's, chronic indwelling foley catheter, PACE patient, presenting with generalized weakness and fatigue.  Admitted for AKI and found to have bacteremia.  Consultants ID  Procedures None  Assessment and Plan: * AKI superimposed on CKD stage IIIa Mild metabolic acidosis No urologic abnormalities on CT renal study.   Creatinine down to 3.05, baseline 1.5 Continue IV fluids, recheck BMP in a.m.   Enterococcal bacteremia Presumed enterococcal UTI, catheter associated.  Chronic indwelling Foley catheter. Blood cultures growing Enterococcus faecalis likely secondary to UTI but urine cultures with multiple species. Catheter was exchanged. Afebrile, vital signs stable.  Continue antibiotics per ID.  TTE reassuring.  Discussed with Dr. Renold Don today, he has requested to continue IV antibiotics over the weekend and will follow-up with the primary infectious disease team on Monday, patient may need long-term IV antibiotics.   Acute metabolic encephalopathy -- resolved Likely secondary to AKI, uremia.      Anemia normocytic, chronic Appears stable   Thrombocytopenia  Appears stable.  Probably secondary to infection.   Parkinson's disease (HCC) Continue home Sinemet      Subjective:  Feels ok  Physical Exam: Vitals:   01/21/23 1914 01/22/23 0446 01/22/23 0500 01/22/23 0746  BP: (!) 149/77 (!) 157/75  (!) 162/72  Pulse: 62 65  69  Resp: 16   16  Temp: 98 F (36.7 C) 98.3 F (36.8 C)  98 F (36.7 C)  TempSrc: Oral Oral  Oral  SpO2: 100% 98%  100%  Weight:   86.3 kg   Height:       Physical Exam Vitals reviewed.  Constitutional:      General: He is not in acute distress.    Appearance: He is not ill-appearing or toxic-appearing.  Cardiovascular:     Rate and  Rhythm: Normal rate and regular rhythm.     Heart sounds: No murmur heard. Pulmonary:     Effort: Pulmonary effort is normal. No respiratory distress.     Breath sounds: No wheezing, rhonchi or rales.  Neurological:     Mental Status: He is alert.  Psychiatric:        Mood and Affect: Mood normal.        Behavior: Behavior normal.     Data Reviewed: UOP 650 Creatinine 3.37 > 3.05  Family Communication: none  Disposition: Status is: Inpatient Remains inpatient appropriate because: bacteremia, AKI     Time spent: 20 minutes  Author: Brendia Sacks, MD 01/22/2023 12:08 PM  For on call review [email or web address].

## 2023-01-23 DIAGNOSIS — B952 Enterococcus as the cause of diseases classified elsewhere: Secondary | ICD-10-CM | POA: Diagnosis not present

## 2023-01-23 DIAGNOSIS — R7881 Bacteremia: Secondary | ICD-10-CM | POA: Diagnosis not present

## 2023-01-23 DIAGNOSIS — N179 Acute kidney failure, unspecified: Secondary | ICD-10-CM | POA: Diagnosis not present

## 2023-01-23 DIAGNOSIS — T83511A Infection and inflammatory reaction due to indwelling urethral catheter, initial encounter: Secondary | ICD-10-CM | POA: Diagnosis not present

## 2023-01-23 LAB — RENAL FUNCTION PANEL
Albumin: 2.2 g/dL — ABNORMAL LOW (ref 3.5–5.0)
Anion gap: 9 (ref 5–15)
BUN: 57 mg/dL — ABNORMAL HIGH (ref 8–23)
CO2: 21 mmol/L — ABNORMAL LOW (ref 22–32)
Calcium: 8.4 mg/dL — ABNORMAL LOW (ref 8.9–10.3)
Chloride: 112 mmol/L — ABNORMAL HIGH (ref 98–111)
Creatinine, Ser: 2.77 mg/dL — ABNORMAL HIGH (ref 0.61–1.24)
GFR, Estimated: 23 mL/min — ABNORMAL LOW (ref >60)
Glucose, Bld: 109 mg/dL — ABNORMAL HIGH (ref 70–99)
Phosphorus: 3.3 mg/dL (ref 2.5–4.6)
Potassium: 4.1 mmol/L (ref 3.5–5.1)
Sodium: 142 mmol/L (ref 135–145)

## 2023-01-23 NOTE — Progress Notes (Signed)
  Progress Note   Patient: Theodore King [id number] DOB: 06-Oct-1945 DOA: 01/18/2023     5 DOS: the patient was seen and examined on 01/23/2023   Brief hospital course: 68yom from [place], PMH dementia, Parkinson's, chronic indwelling foley catheter, PACE patient, presenting with generalized weakness and fatigue.  Admitted for AKI and found to have bacteremia.  Consultants ID  Procedures None  Assessment and Plan: * AKI superimposed on CKD stage IIIa Mild metabolic acidosis No urologic abnormalities on CT renal study.   Creatinine down to 2.77, baseline 1.5 Continue IV fluids, recheck BMP in a.m.   Enterococcal bacteremia Presumed enterococcal UTI, catheter associated.  Chronic indwelling Foley catheter. Blood cultures grew Enterococcus faecalis, likely secondary to UTI but urine culture with multiple species. Catheter was exchanged. Afebrile, vital signs stable.  Continue antibiotics per ID.  TTE reassuring.  Discussed with Dr. Renold Don 8/`7, he has requested to continue IV antibiotics over the weekend and will follow-up with the primary infectious disease team on Monday, patient may need long-term IV antibiotics.   Acute metabolic encephalopathy -- resolved Likely secondary to AKI, uremia.      Anemia normocytic, chronic Appears stable   Thrombocytopenia  Appears stable.  Probably secondary to infection.   Parkinson's disease (HCC) Continue home Sinemet    Subjective:  Feels ok  Physical Exam: Vitals:   01/22/23 1514 01/22/23 1934 01/23/23 0429 01/23/23 0820  BP: (!) 156/59 (!) 161/64 (!) 141/59 (!) 167/59  Pulse: 61 62 62 62  Resp: 17 16 17 16   Temp: 98 F (36.7 C) 97.6 F (36.4 C) (!) 97.4 F (36.3 C) (!) 97.5 F (36.4 C)  TempSrc: Oral Oral Oral Oral  SpO2: 100% 100% 100% 100%  Weight:      Height:       Physical Exam Vitals reviewed.  Constitutional:      General: He is not in acute distress.    Appearance: He is not ill-appearing or  toxic-appearing.  Cardiovascular:     Rate and Rhythm: Normal rate and regular rhythm.     Heart sounds: No murmur heard. Pulmonary:     Effort: Pulmonary effort is normal. No respiratory distress.     Breath sounds: No wheezing, rhonchi or rales.  Neurological:     Mental Status: He is alert.  Psychiatric:        Mood and Affect: Mood normal.        Behavior: Behavior normal.     Data Reviewed: Creatinine down to 2.77  Family Communication: none present  Disposition: Status is: Inpatient Remains inpatient appropriate because: bacteremia, AKI     Time spent: 20 minutes  Author: Brendia Sacks, MD 01/23/2023 1:34 PM  For on call review [email or web address].

## 2023-01-23 NOTE — Plan of Care (Signed)

## 2023-01-24 ENCOUNTER — Inpatient Hospital Stay (HOSPITAL_COMMUNITY): Payer: Medicare (Managed Care)

## 2023-01-24 DIAGNOSIS — B952 Enterococcus as the cause of diseases classified elsewhere: Secondary | ICD-10-CM | POA: Diagnosis not present

## 2023-01-24 DIAGNOSIS — T83511A Infection and inflammatory reaction due to indwelling urethral catheter, initial encounter: Secondary | ICD-10-CM | POA: Diagnosis not present

## 2023-01-24 DIAGNOSIS — N179 Acute kidney failure, unspecified: Secondary | ICD-10-CM | POA: Diagnosis not present

## 2023-01-24 DIAGNOSIS — R7881 Bacteremia: Secondary | ICD-10-CM | POA: Diagnosis not present

## 2023-01-24 LAB — URINALYSIS, ROUTINE W REFLEX MICROSCOPIC
Bilirubin Urine: NEGATIVE
Glucose, UA: NEGATIVE mg/dL
Ketones, ur: NEGATIVE mg/dL
Nitrite: NEGATIVE
Protein, ur: NEGATIVE mg/dL
Specific Gravity, Urine: 1.011 (ref 1.005–1.030)
pH: 6 (ref 5.0–8.0)

## 2023-01-24 LAB — BASIC METABOLIC PANEL
Anion gap: 9 (ref 5–15)
BUN: 43 mg/dL — ABNORMAL HIGH (ref 8–23)
CO2: 22 mmol/L (ref 22–32)
Calcium: 8.3 mg/dL — ABNORMAL LOW (ref 8.9–10.3)
Chloride: 111 mmol/L (ref 98–111)
Creatinine, Ser: 2.83 mg/dL — ABNORMAL HIGH (ref 0.61–1.24)
GFR, Estimated: 22 mL/min — ABNORMAL LOW (ref >60)
Glucose, Bld: 97 mg/dL (ref 70–99)
Potassium: 4.1 mmol/L (ref 3.5–5.1)
Sodium: 142 mmol/L (ref 135–145)

## 2023-01-24 LAB — CREATININE, URINE, RANDOM: Creatinine, Urine: 58 mg/dL

## 2023-01-24 LAB — SODIUM, URINE, RANDOM: Sodium, Ur: 91 mmol/L

## 2023-01-24 NOTE — Plan of Care (Signed)

## 2023-01-24 NOTE — Progress Notes (Signed)
  Progress Note   Patient: Theodore King [id number]:[id number] DOB: [birth year] DOA: 01/18/2023     6 DOS: the patient was seen and examined on 01/24/2023   Brief hospital course: 68yom from [place], PMH dementia, Parkinson's, chronic indwelling foley catheter, PACE patient, presenting with generalized weakness and fatigue.  Admitted for AKI and found to have bacteremia.  Consultants ID  Procedures None  Assessment and Plan: * AKI superimposed on CKD stage IIIa Mild metabolic acidosis No urologic abnormalities on CT renal study.   Creatinine 2.83, no change last 24 hours, but BUN still trending down, baseline 1.5 Continue IV fluids, recheck BMP in a.m. check renal u/s and u/a   Enterococcal bacteremia Presumed enterococcal UTI, catheter associated.  Chronic indwelling Foley catheter. Blood cultures grew Enterococcus faecalis, likely secondary to UTI but urine culture with multiple species. Catheter was exchanged. Afebrile, vital signs stable.  Continue antibiotics per ID.  TTE reassuring.      Acute metabolic encephalopathy -- resolved Likely secondary to AKI, uremia.      Anemia normocytic, chronic Appears stable   Thrombocytopenia  Appears stable.  Probably secondary to infection.   Parkinson's disease (HCC) Continue home Sinemet      Subjective:  Feels ok  Physical Exam: Vitals:   01/24/23 0436 01/24/23 0809 01/24/23 1029 01/24/23 1601  BP: (!) 135/57 (!) 170/64 (!) 134/50 (!) 168/66  Pulse: (!) 121 61 69 64  Resp: 18 16  15   Temp: 97.7 F (36.5 C) 97.6 F (36.4 C)  98 F (36.7 C)  TempSrc: Oral Oral  Oral  SpO2: 100% 100%  100%  Weight:      Height:       Physical Exam Vitals reviewed.  Constitutional:      General: He is not in acute distress.    Appearance: He is not ill-appearing or toxic-appearing.  Cardiovascular:     Rate and Rhythm: Normal rate and regular rhythm.     Heart sounds: No murmur heard. Pulmonary:     Effort: Pulmonary effort is  normal. No respiratory distress.     Breath sounds: No wheezing, rhonchi or rales.  Neurological:     Mental Status: He is alert.  Psychiatric:        Mood and Affect: Mood normal.        Behavior: Behavior normal.     Data Reviewed: Creatinine 2.83, no significant change BUN down to 43 8/16 BC NGTD  Family Communication: none  Disposition: Status is: Inpatient Remains inpatient appropriate because: bacteremia, AKI     Time spent: 35 minutes  Author: Brendia Sacks, MD 01/24/2023 5:24 PM  For on call review [email or web address].

## 2023-01-24 NOTE — Progress Notes (Signed)
[facility]  Date of Admission:  01/18/2023     Total days of antibiotics 7         ASSESSMENT:  Theodore King appears to have cleared his Enterococcal bacteremia with blood cultures from 8/16 remaining without growth in 3 days. Suspected source may be urinary although no significant findings of CT imaging. TTE was negative. Will continue current dose of ampicillin. Continue to monitor cultures for clearance of bacteremia. Duration and route pending. Remaining medical and supportive care per Internal Medicine.   PLAN:  Continue current dose of ampicillin. Monitor cultures for clearance of bacteremia.  Remaining medical and supportive care per Internal Medicine.     I have personally spent 22  minutes involved in face-to-face and non-face-to-face activities for this patient on the day of the visit. Professional time spent includes the following activities: Preparing to see the patient (review of tests), Obtaining and/or reviewing separately obtained history (admission/discharge record), Performing a medically appropriate examination and/or evaluation , Ordering medications/tests/procedures, referring and communicating with other health care professionals, Documenting clinical information in the EMR, Independently interpreting results (not separately reported), Communicating results to the patient/family/caregiver, Counseling and educating the patient/family/caregiver and Care coordination (not separately reported).    Principal Problem:   Enterococcal bacteremia Active Problems:   Parkinson's disease (HCC)   AKI (acute kidney injury) (HCC)   Acute metabolic encephalopathy   Chronic indwelling Foley catheter   Catheter-associated urinary tract infection (HCC)    carbidopa-levodopa  1 tablet Oral QHS   carbidopa-levodopa  2 tablet Oral QID   Chlorhexidine Gluconate Cloth  6 each Topical Daily   heparin  5,000 Units Subcutaneous Q8H   sertraline  25 mg  Oral Daily   sodium chloride flush  3 mL Intravenous Q12H   traZODone  50 mg Oral QHS    SUBJECTIVE:  Afebrile overnight with no acute events.   No Known Allergies   Review of Systems: Review of Systems  Constitutional:  Negative for chills, fever and weight loss.  Respiratory:  Negative for cough, shortness of breath and wheezing.   Cardiovascular:  Negative for chest pain and leg swelling.  Gastrointestinal:  Negative for abdominal pain, constipation, diarrhea, nausea and vomiting.  Skin:  Negative for rash.      OBJECTIVE: Vitals:   01/23/23 1956 01/24/23 0436 01/24/23 0809 01/24/23 1029  BP: (!) 163/60 (!) 135/57 (!) 170/64 (!) 134/50  Pulse: 63 (!) 121 61 69  Resp: 16 18 16    Temp: 98 F (36.7 C) 97.7 F (36.5 C) 97.6 F (36.4 C)   TempSrc: Oral Oral Oral   SpO2: 94% 100% 100%   Weight:      Height:       Body mass index is 25.1 kg/m.  Physical Exam Constitutional:      General: He is not in acute distress.    Appearance: He is well-developed.  Cardiovascular:     Rate and Rhythm: Normal rate and regular rhythm.     Heart sounds: Normal heart sounds.  Pulmonary:     Effort: Pulmonary effort is normal.     Breath sounds: Normal breath sounds.  Skin:    General: Skin is warm and dry.  Neurological:     Mental Status: He is alert.  Psychiatric:        Mood and Affect: Mood normal.     Lab Results Lab Results  Component Value Date   WBC 12.4 (H) 01/19/2023   HGB 10.8 (  L) 01/19/2023   HCT 32.9 (L) 01/19/2023   MCV 84.4 01/19/2023   PLT 129 (L) 01/19/2023    Lab Results  Component Value Date   CREATININE 2.83 (H) 01/24/2023   BUN 43 (H) 01/24/2023   NA 142 01/24/2023   K 4.1 01/24/2023   CL 111 01/24/2023   CO2 22 01/24/2023    Lab Results  Component Value Date   ALT 5 01/18/2023   AST 13 (L) 01/18/2023   ALKPHOS 84 01/18/2023   BILITOT 1.7 (H) 01/18/2023     Microbiology: Recent Results (from the past 240 hour(s))  Remove and  replace urinary cath (placed > 5 days) then obtain urine culture from new indwelling urinary catheter.     Status: Abnormal   Collection Time: 01/18/23  8:08 PM   Specimen: Urine, Catheterized  Result Value Ref Range Status   Specimen Description URINE, CATHETERIZED  Final   Special Requests   Final    NONE Performed at [facility], [address], [place], [place] [address]    Culture MULTIPLE SPECIES PRESENT, SUGGEST RECOLLECTION (A)  Final   Report Status 01/20/2023 FINAL  Final  Culture, blood (Routine X 2) w Reflex to ID Panel     Status: Abnormal   Collection Time: 01/18/23 11:47 PM   Specimen: BLOOD  Result Value Ref Range Status   Specimen Description BLOOD SITE NOT SPECIFIED  Final   Special Requests   Final    BOTTLES DRAWN AEROBIC AND ANAEROBIC Blood Culture results may not be optimal due to an excessive volume of blood received in culture bottles   Culture  Setup Time   Final    GRAM POSITIVE COCCI IN BOTH AEROBIC AND ANAEROBIC BOTTLES CRITICAL RESULT CALLED TO, READ BACK BY AND VERIFIED WITH: J LEDFORD,[email or web address]@[id number]  01/20/23 MK Performed at [facility], [address], [place], [place] [address]    Culture ENTEROCOCCUS FAECALIS (A)  Final   Report Status 01/22/2023 FINAL  Final   Organism ID, Bacteria ENTEROCOCCUS FAECALIS  Final      Susceptibility   Enterococcus faecalis - MIC*    AMPICILLIN <=2 SENSITIVE Sensitive     VANCOMYCIN 1 SENSITIVE Sensitive     GENTAMICIN SYNERGY SENSITIVE Sensitive     * ENTEROCOCCUS FAECALIS  Blood Culture ID Panel (Reflexed)     Status: Abnormal   Collection Time: 01/18/23 11:47 PM  Result Value Ref Range Status   Enterococcus faecalis DETECTED (A) NOT DETECTED Final    Comment: CRITICAL RESULT CALLED TO, READ BACK BY AND VERIFIED WITH: J LEDFORD,[email or web address]@[id number]  01/20/23 MK    Enterococcus Faecium NOT DETECTED NOT DETECTED Final   Listeria monocytogenes NOT DETECTED NOT DETECTED Final   Staphylococcus species NOT DETECTED  NOT DETECTED Final   Staphylococcus aureus (BCID) NOT DETECTED NOT DETECTED Final   Staphylococcus epidermidis NOT DETECTED NOT DETECTED Final   Staphylococcus lugdunensis NOT DETECTED NOT DETECTED Final   Streptococcus species NOT DETECTED NOT DETECTED Final   Streptococcus agalactiae NOT DETECTED NOT DETECTED Final   Streptococcus pneumoniae NOT DETECTED NOT DETECTED Final   Streptococcus pyogenes NOT DETECTED NOT DETECTED Final   A.calcoaceticus-baumannii NOT DETECTED NOT DETECTED Final   Bacteroides fragilis NOT DETECTED NOT DETECTED Final   Enterobacterales NOT DETECTED NOT DETECTED Final   Enterobacter cloacae complex NOT DETECTED NOT DETECTED Final   Escherichia coli NOT DETECTED NOT DETECTED Final   Klebsiella aerogenes NOT DETECTED NOT DETECTED Final   Klebsiella oxytoca NOT DETECTED NOT DETECTED Final  Klebsiella pneumoniae NOT DETECTED NOT DETECTED Final   Proteus species NOT DETECTED NOT DETECTED Final   Salmonella species NOT DETECTED NOT DETECTED Final   Serratia marcescens NOT DETECTED NOT DETECTED Final   Haemophilus influenzae NOT DETECTED NOT DETECTED Final   Neisseria meningitidis NOT DETECTED NOT DETECTED Final   Pseudomonas aeruginosa NOT DETECTED NOT DETECTED Final   Stenotrophomonas maltophilia NOT DETECTED NOT DETECTED Final   Candida albicans NOT DETECTED NOT DETECTED Final   Candida auris NOT DETECTED NOT DETECTED Final   Candida glabrata NOT DETECTED NOT DETECTED Final   Candida krusei NOT DETECTED NOT DETECTED Final   Candida parapsilosis NOT DETECTED NOT DETECTED Final   Candida tropicalis NOT DETECTED NOT DETECTED Final   Cryptococcus neoformans/gattii NOT DETECTED NOT DETECTED Final   Vancomycin resistance NOT DETECTED NOT DETECTED Final    Comment: Performed at [facility], [address], [place], [place] [address]  Culture, blood (Routine X 2) w Reflex to ID Panel     Status: Abnormal   Collection Time: 01/19/23  9:48 AM   Specimen: BLOOD  LEFT FOREARM  Result Value Ref Range Status   Specimen Description BLOOD LEFT FOREARM  Final   Special Requests   Final    BOTTLES DRAWN AEROBIC AND ANAEROBIC Blood Culture adequate volume   Culture  Setup Time   Final    ANAEROBIC BOTTLE ONLY GRAM POSITIVE COCCI CRITICAL VALUE NOTED.  VALUE IS CONSISTENT WITH PREVIOUSLY REPORTED AND CALLED VALUE.    Culture (A)  Final    ENTEROCOCCUS FAECALIS SUSCEPTIBILITIES PERFORMED ON PREVIOUS CULTURE WITHIN THE LAST 5 DAYS. Performed at [facility], [address], [place], [place] [address]    Report Status 01/22/2023 FINAL  Final  Culture, blood (Routine X 2) w Reflex to ID Panel     Status: None (Preliminary result)   Collection Time: 01/21/23  5:21 AM   Specimen: BLOOD LEFT HAND  Result Value Ref Range Status   Specimen Description BLOOD LEFT HAND  Final   Special Requests   Final    BOTTLES DRAWN AEROBIC AND ANAEROBIC Blood Culture adequate volume   Culture   Final    NO GROWTH 3 DAYS Performed at [facility], [address], [place], [place] [address]    Report Status PENDING  Incomplete  Culture, blood (Routine X 2) w Reflex to ID Panel     Status: None (Preliminary result)   Collection Time: 01/21/23  5:29 AM   Specimen: BLOOD  Result Value Ref Range Status   Specimen Description BLOOD BLOOD LEFT ARM  Final   Special Requests   Final    BOTTLES DRAWN AEROBIC AND ANAEROBIC Blood Culture adequate volume   Culture   Final    NO GROWTH 3 DAYS Performed at [facility], [address] [place], [place] [address]    Report Status PENDING  Incomplete     Marcos Eke, NP [facility]  01/24/2023  1:18 PM

## 2023-01-24 NOTE — TOC Progression Note (Signed)
Transition of Care [facility]) - Progression Note    Patient Details  Name: Theodore King MRN: [id number] Date of Birth: [birth year]  Transition of Care [facility]) CM/SW Contact  Sven Pinheiro A [place], [place] Phone Number: 01/24/2023, 4:13 PM  Clinical Narrative:     CSW was contacted by Almira Coaster at [place]. CSW updated her possible dc tomorrow versus Wednesday.   TOC will continue to follow.   Expected Discharge Plan: Long Term Nursing Home Barriers to Discharge: Continued Medical Work up  Expected Discharge Plan and Services   Discharge Planning Services: CM Consult Post Acute Care Choice: Nursing Home Living arrangements for the past 2 months: Skilled Nursing Facility ([facility] at [facility])                                       Social Determinants of Health (SDOH) Interventions SDOH Screenings   Food Insecurity: No Food Insecurity (01/19/2023)  Housing: Low Risk  (01/19/2023)  Transportation Needs: No Transportation Needs (01/19/2023)  Utilities: Not At Risk (01/19/2023)  Tobacco Use: Medium Risk (01/18/2023)    Readmission Risk Interventions    01/19/2023    2:05 PM 04/08/2022    9:56 AM 04/07/2022    8:15 AM  Readmission Risk Prevention Plan  Transportation Screening Complete Complete Complete  PCP or Specialist Appt within 3-5 Days Complete    HRI or Home Care Consult Complete    Social Work Consult for Recovery Care Planning/Counseling Complete    Palliative Care Screening Complete    Medication Review Oceanographer) Complete Complete Complete  PCP or Specialist appointment within 3-5 days of discharge  Complete Complete  HRI or Home Care Consult  Complete Complete  SW Recovery Care/Counseling Consult  Complete Complete  Palliative Care Screening   Not Applicable  Skilled Nursing Facility  Not Applicable Not Applicable

## 2023-01-25 DIAGNOSIS — B952 Enterococcus as the cause of diseases classified elsewhere: Secondary | ICD-10-CM | POA: Diagnosis not present

## 2023-01-25 DIAGNOSIS — G9341 Metabolic encephalopathy: Secondary | ICD-10-CM | POA: Diagnosis not present

## 2023-01-25 DIAGNOSIS — R7881 Bacteremia: Secondary | ICD-10-CM | POA: Diagnosis not present

## 2023-01-25 DIAGNOSIS — T83511A Infection and inflammatory reaction due to indwelling urethral catheter, initial encounter: Secondary | ICD-10-CM | POA: Diagnosis not present

## 2023-01-25 LAB — BASIC METABOLIC PANEL
Anion gap: 9 (ref 5–15)
BUN: 38 mg/dL — ABNORMAL HIGH (ref 8–23)
CO2: 25 mmol/L (ref 22–32)
Calcium: 8.4 mg/dL — ABNORMAL LOW (ref 8.9–10.3)
Chloride: 107 mmol/L (ref 98–111)
Creatinine, Ser: 2.45 mg/dL — ABNORMAL HIGH (ref 0.61–1.24)
GFR, Estimated: 26 mL/min — ABNORMAL LOW (ref >60)
Glucose, Bld: 114 mg/dL — ABNORMAL HIGH (ref 70–99)
Potassium: 3.8 mmol/L (ref 3.5–5.1)
Sodium: 141 mmol/L (ref 135–145)

## 2023-01-25 MED ORDER — AMOXICILLIN 500 MG PO CAPS
1000.0000 mg | ORAL_CAPSULE | Freq: Three times a day (TID) | ORAL | Status: DC
  Administered 2023-01-25: 1000 mg via ORAL
  Filled 2023-01-25: qty 2

## 2023-01-25 MED ORDER — AMOXICILLIN 500 MG PO CAPS: 1000.0000 mg | ORAL_CAPSULE | Freq: Three times a day (TID) | ORAL | Status: AC

## 2023-01-25 NOTE — TOC Transition Note (Addendum)
Transition of Care [facility]) - CM/SW Discharge Note   Patient Details  Name: Theodore King MRN: [id number] Date of Birth: 1945-12-23  Transition of Care [facility]) CM/SW Contact:  Tommy Minichiello A [place], Theresia Majors Phone Number: 01/25/2023, 3:11 PM   Clinical Narrative:     Patient will DC to: [facility]  Anticipated DC date: 01/25/23  Family notified: Laroy Apple  Transport by: [facility] Wheelchair transport      Per MD patient ready for DC to [facility]. RN, patient, patient's family, and facility notified of DC. Discharge Summary and FL2 sent to facility. RN to call report prior to discharge ( [phone]). DC packet on chart. Ambulance transport requested for patient.     CSW will sign off for now as social work intervention is no longer needed. Please consult [place] again if new needs arise.   Final next level of care: Skilled Nursing Facility Barriers to Discharge: Barriers Resolved   Patient Goals and CMS Choice CMS Medicare.gov Compare Post Acute Care list provided to:: Patient Represenative (must comment) (daughter, [name] Milas Gain) Choice offered to / list presented to : [facility] POA / Guardian  Discharge Placement                Patient chooses bed at: [facility] Patient to be transferred to facility by: [facility] wheelchair ambulance Name of family member notified: Leium Dewaele Patient and family notified of of transfer: 01/25/23  Discharge Plan and Services Additional resources added to the After Visit Summary for     Discharge Planning Services: CM Consult Post Acute Care Choice: Nursing Home                               Social Determinants of Health (SDOH) Interventions SDOH Screenings   Food Insecurity: No Food Insecurity (01/19/2023)  Housing: Low Risk  (01/19/2023)  Transportation Needs: No Transportation Needs (01/19/2023)  Utilities: Not At Risk (01/19/2023)  Tobacco Use: Medium Risk (01/18/2023)     Readmission Risk Interventions    01/19/2023    2:05  PM 04/08/2022    9:56 AM 04/07/2022    8:15 AM  Readmission Risk Prevention Plan  Transportation Screening Complete Complete Complete  PCP or Specialist Appt within 3-5 Days Complete    HRI or Home Care Consult Complete    Social Work Consult for Recovery Care Planning/Counseling Complete    Palliative Care Screening Complete    Medication Review Oceanographer) Complete Complete Complete  PCP or Specialist appointment within 3-5 days of discharge  Complete Complete  HRI or Home Care Consult  Complete Complete  SW Recovery Care/Counseling Consult  Complete Complete  Palliative Care Screening   Not Applicable  Skilled Nursing Facility  Not Applicable Not Applicable

## 2023-01-25 NOTE — Plan of Care (Signed)

## 2023-01-25 NOTE — Discharge Summary (Addendum)
Physician Discharge Summary   GARWIN STADELMAN WUJ:[id number] DOB: 30-Dec-1945 DOA: 01/18/2023  PCP[facility]  Admit date: 01/18/2023 Discharge date: 01/25/2023  [facility]: [facility] Disposition:  [facility] Discharging physician: Lewie Chamber, MD Barriers to discharge: none  Recommendations at discharge: Continue amoxicillin course to completion   Discharge Condition: stable CODE STATUS: Full Diet recommendation:  Diet Orders (From admission, onward)     Start     Ordered   01/25/23 0000  Diet general        01/25/23 1139   01/19/23 1228  Diet regular Room service appropriate? Yes; Fluid consistency: Thin  Diet effective now       Question Answer Comment  Room service appropriate? Yes   Fluid consistency: Thin      01/19/23 1227            Hospital Course: 77yom from [facility], [place] dementia, Parkinson's, chronic indwelling foley catheter, [facility] patient, presenting with generalized weakness and fatigue.  Admitted for AKI and found to have bacteremia.  Consultants ID  Procedures None  Assessment and Plan:  AKI superimposed on CKD stage IIIa Mild metabolic acidosis No urologic abnormalities on CT renal study.   Creat improved to 2.45 at discharge; peaked at 5.75; baseline around 1.5  Enterococcal bacteremia Presumed enterococcal UTI, catheter associated.  Chronic indwelling Foley catheter. Blood cultures grew Enterococcus faecalis, likely secondary to UTI but urine culture with multiple species. Followed by ID as well; continue amoxicillin until 8/29 (higher dosing intended and renal function accounted for) Catheter was exchanged    Acute metabolic encephalopathy -- resolved Likely secondary to AKI, uremia.      Anemia normocytic, chronic Appears stable   Thrombocytopenia  Appears stable.  Probably secondary to infection.   Parkinson's disease (HCC) Continue home Sinemet  Principal Diagnosis: Enterococcal  bacteremia  Discharge Diagnoses: Active Hospital Problems   Diagnosis Date Noted   Acute renal failure superimposed on stage 3a chronic kidney disease (HCC) 10/23/2021    Priority: High   Chronic indwelling Foley catheter     Priority: Low   Parkinson's disease (HCC) 10/26/2012    Priority: 2.    Resolved Hospital Problems   Diagnosis Date Noted Date Resolved   Enterococcal bacteremia 01/20/2023 01/25/2023    Priority: High   Catheter-associated urinary tract infection (HCC) 08/06/2022 01/25/2023    Priority: High   Acute metabolic encephalopathy 02/01/2022 01/25/2023    Priority: Medium      Discharge Instructions     Diet general   Complete by: As directed    Increase activity slowly   Complete by: As directed       Allergies as of 01/25/2023   No Known Allergies      Medication List     TAKE these medications    acetaminophen 500 MG tablet Commonly known as: TYLENOL Take 1,000 mg by mouth 2 (two) times daily.   albuterol 108 (90 Base) MCG/ACT inhaler Commonly known as: VENTOLIN HFA Inhale 2 puffs into the lungs every 6 (six) hours as needed for wheezing.   amoxicillin 500 MG capsule Commonly known as: AMOXIL Take 2 capsules (1,000 mg total) by mouth 3 (three) times daily for 9 days.   bisacodyl 10 MG suppository Commonly known as: DULCOLAX Place 10 mg rectally daily as needed (constipation not relieved by Milk of Magnesia).   carbidopa-levodopa 25-100 MG tablet Commonly known as: SINEMET IR Take 2 tablets by mouth daily at 12 noon. 0700 What changed: Another  medication with the same name was changed. Make sure you understand how and when to take each.   carbidopa-levodopa 50-200 MG tablet Commonly known as: SINEMET CR Take 1 tablet by mouth at bedtime. What changed: Another medication with the same name was changed. Make sure you understand how and when to take each.   carbidopa-levodopa 25-100 MG tablet Commonly known as: SINEMET IR Take 2  tablets at 7 AM, 2 tablets at 10 AM, 2 tablets at 2 PM and 1 tablet at 6 PM What changed:  how much to take how to take this when to take this additional instructions   ENEMA RE Place 1 enema rectally daily as needed (constipation not relieved by bisacodyl suppository).   glycopyrrolate 1 MG tablet Commonly known as: Robinul Take 1 tablet (1 mg total) by mouth 2 (two) times daily.   MILK OF MAGNESIA PO Take 30 mLs by mouth daily as needed (constipation).   nystatin powder Commonly known as: MYCOSTATIN/NYSTOP Apply 1 Application topically 3 (three) times daily as needed (moisture associated dermatitis). Apply to groin area and skin folds.   sertraline 25 MG tablet Commonly known as: ZOLOFT Take 1 tablet (25 mg total) by mouth daily.   traZODone 50 MG tablet Commonly known as: DESYREL Take 50 mg by mouth at bedtime.   UNABLE TO FIND Apply 1 application  topically daily. Med Name: Lumiderm Advanced Therapy Apply topically to all extremities daily. Apply topically to all extremities four times daily as needed for dry skin/skin integrity.   Vitamin D (Ergocalciferol) 1.25 MG (50000 UNIT) Caps capsule Commonly known as: DRISDOL Take 50,000 Units by mouth every 30 (thirty) days. Once a month on the 3rd.        No Known Allergies  Consultations: ID  Procedures:   Discharge Exam: BP 136/67 (BP Location: Left Arm)   Pulse 65   Temp 97.7 F (36.5 C) (Oral)   Resp 16   Ht 6\' 1"  (1.854 m)   Wt 86.3 kg   SpO2 100%   BMI 25.10 kg/m  Physical Exam Constitutional:      Comments: Pleasant but demented elderly man in no distress  HENT:     Head: Normocephalic and atraumatic.     Mouth/Throat:     Mouth: Mucous membranes are moist.  Eyes:     Extraocular Movements: Extraocular movements intact.  Cardiovascular:     Rate and Rhythm: Normal rate and regular rhythm.  Pulmonary:     Effort: Pulmonary effort is normal. No respiratory distress.     Breath sounds:  Normal breath sounds. No wheezing.  Abdominal:     General: Bowel sounds are normal. There is no distension.     Palpations: Abdomen is soft.     Tenderness: There is no abdominal tenderness.  Genitourinary:    Comments: Indwelling foley in place with clear yellow urine in bag Musculoskeletal:        General: Normal range of motion.     Cervical back: Normal range of motion and neck supple.  Skin:    General: Skin is warm and dry.  Neurological:     Mental Status: Mental status is at baseline. He is disoriented.     Comments: Not oriented at all on my exam; follows commands and moves all 4 extremites      The results of significant diagnostics from this hospitalization (including imaging, microbiology, ancillary and laboratory) are listed below for reference.   Microbiology: Recent Results (from the past 240 hour(s))  Remove and replace urinary cath (placed > 5 days) then obtain urine culture from new indwelling urinary catheter.     Status: Abnormal   Collection Time: 01/18/23  8:08 PM   Specimen: Urine, Catheterized  Result Value Ref Range Status   Specimen Description URINE, CATHETERIZED  Final   Special Requests   Final    NONE Performed at [facility], [address], [place], [place] [address]    Culture MULTIPLE SPECIES PRESENT, SUGGEST RECOLLECTION (A)  Final   Report Status 01/20/2023 FINAL  Final  Culture, blood (Routine X 2) w Reflex to ID Panel     Status: Abnormal   Collection Time: 01/18/23 11:47 PM   Specimen: BLOOD  Result Value Ref Range Status   Specimen Description BLOOD SITE NOT SPECIFIED  Final   Special Requests   Final    BOTTLES DRAWN AEROBIC AND ANAEROBIC Blood Culture results may not be optimal due to an excessive volume of blood received in culture bottles   Culture  Setup Time   Final    GRAM POSITIVE COCCI IN BOTH AEROBIC AND ANAEROBIC BOTTLES CRITICAL RESULT CALLED TO, READ BACK BY AND VERIFIED WITH: J LEDFORD,[email or web address]@[id number]  01/20/23  MK Performed at [facility], [address], [place], [place] [address]    Culture ENTEROCOCCUS FAECALIS (A)  Final   Report Status 01/22/2023 FINAL  Final   Organism ID, Bacteria ENTEROCOCCUS FAECALIS  Final      Susceptibility   Enterococcus faecalis - MIC*    AMPICILLIN <=2 SENSITIVE Sensitive     VANCOMYCIN 1 SENSITIVE Sensitive     GENTAMICIN SYNERGY SENSITIVE Sensitive     * ENTEROCOCCUS FAECALIS  Blood Culture ID Panel (Reflexed)     Status: Abnormal   Collection Time: 01/18/23 11:47 PM  Result Value Ref Range Status   Enterococcus faecalis DETECTED (A) NOT DETECTED Final    Comment: CRITICAL RESULT CALLED TO, READ BACK BY AND VERIFIED WITH: J LEDFORD,[email or web address]@[id number]  01/20/23 MK    Enterococcus Faecium NOT DETECTED NOT DETECTED Final   Listeria monocytogenes NOT DETECTED NOT DETECTED Final   Staphylococcus species NOT DETECTED NOT DETECTED Final   Staphylococcus aureus (BCID) NOT DETECTED NOT DETECTED Final   Staphylococcus epidermidis NOT DETECTED NOT DETECTED Final   Staphylococcus lugdunensis NOT DETECTED NOT DETECTED Final   Streptococcus species NOT DETECTED NOT DETECTED Final   Streptococcus agalactiae NOT DETECTED NOT DETECTED Final   Streptococcus pneumoniae NOT DETECTED NOT DETECTED Final   Streptococcus pyogenes NOT DETECTED NOT DETECTED Final   A.calcoaceticus-baumannii NOT DETECTED NOT DETECTED Final   Bacteroides fragilis NOT DETECTED NOT DETECTED Final   Enterobacterales NOT DETECTED NOT DETECTED Final   Enterobacter cloacae complex NOT DETECTED NOT DETECTED Final   Escherichia coli NOT DETECTED NOT DETECTED Final   Klebsiella aerogenes NOT DETECTED NOT DETECTED Final   Klebsiella oxytoca NOT DETECTED NOT DETECTED Final   Klebsiella pneumoniae NOT DETECTED NOT DETECTED Final   Proteus species NOT DETECTED NOT DETECTED Final   Salmonella species NOT DETECTED NOT DETECTED Final   Serratia marcescens NOT DETECTED NOT DETECTED Final   Haemophilus influenzae  NOT DETECTED NOT DETECTED Final   Neisseria meningitidis NOT DETECTED NOT DETECTED Final   Pseudomonas aeruginosa NOT DETECTED NOT DETECTED Final   Stenotrophomonas maltophilia NOT DETECTED NOT DETECTED Final   Candida albicans NOT DETECTED NOT DETECTED Final   Candida auris NOT DETECTED NOT DETECTED Final   Candida glabrata NOT DETECTED NOT DETECTED Final   Candida krusei NOT  DETECTED NOT DETECTED Final   Candida parapsilosis NOT DETECTED NOT DETECTED Final   Candida tropicalis NOT DETECTED NOT DETECTED Final   Cryptococcus neoformans/gattii NOT DETECTED NOT DETECTED Final   Vancomycin resistance NOT DETECTED NOT DETECTED Final    Comment: Performed at [facility], [address], [place], [place] [address]  Culture, blood (Routine X 2) w Reflex to ID Panel     Status: Abnormal   Collection Time: 01/19/23  9:48 AM   Specimen: BLOOD LEFT FOREARM  Result Value Ref Range Status   Specimen Description BLOOD LEFT FOREARM  Final   Special Requests   Final    BOTTLES DRAWN AEROBIC AND ANAEROBIC Blood Culture adequate volume   Culture  Setup Time   Final    ANAEROBIC BOTTLE ONLY GRAM POSITIVE COCCI CRITICAL VALUE NOTED.  VALUE IS CONSISTENT WITH PREVIOUSLY REPORTED AND CALLED VALUE.    Culture (A)  Final    ENTEROCOCCUS FAECALIS SUSCEPTIBILITIES PERFORMED ON PREVIOUS CULTURE WITHIN THE LAST 5 DAYS. Performed at [facility], [address], [place], [place] [address]    Report Status 01/22/2023 FINAL  Final  Culture, blood (Routine X 2) w Reflex to ID Panel     Status: None (Preliminary result)   Collection Time: 01/21/23  5:21 AM   Specimen: BLOOD LEFT HAND  Result Value Ref Range Status   Specimen Description BLOOD LEFT HAND  Final   Special Requests   Final    BOTTLES DRAWN AEROBIC AND ANAEROBIC Blood Culture adequate volume   Culture   Final    NO GROWTH 4 DAYS Performed at [facility], [address], [place], [place] [address]    Report Status PENDING   Incomplete  Culture, blood (Routine X 2) w Reflex to ID Panel     Status: None (Preliminary result)   Collection Time: 01/21/23  5:29 AM   Specimen: BLOOD  Result Value Ref Range Status   Specimen Description BLOOD BLOOD LEFT ARM  Final   Special Requests   Final    BOTTLES DRAWN AEROBIC AND ANAEROBIC Blood Culture adequate volume   Culture   Final    NO GROWTH 4 DAYS Performed at [facility], [address] [place], [place] [address]    Report Status PENDING  Incomplete     Labs: BNP (last 3 results) No results for input(s): "BNP" in the last 8760 hours. Basic Metabolic Panel: Recent Labs  Lab 01/20/23 0953 01/21/23 0529 01/22/23 0619 01/23/23 0622 01/24/23 0912 01/25/23 0329  NA 140 141 140 142 142 141  K 4.3 4.0 3.8 4.1 4.1 3.8  CL 108 110 110 112* 111 107  CO2 19* 21* 21* 21* 22 25  GLUCOSE 115* 109* 120* 109* 97 114*  BUN 87* 84* 72* 57* 43* 38*  CREATININE 3.87* 3.37* 3.05* 2.77* 2.83* 2.45*  CALCIUM 8.7* 8.5* 8.3* 8.4* 8.3* 8.4*  PHOS 3.2 3.5 3.4 3.3  --   --    Liver Function Tests: Recent Labs  Lab 01/18/23 1940 01/20/23 0953 01/21/23 0529 01/22/23 0619 01/23/23 0622  AST 13*  --   --   --   --   ALT 5  --   --   --   --   ALKPHOS 84  --   --   --   --   BILITOT 1.7*  --   --   --   --   PROT 6.8  --   --   --   --  ALBUMIN 3.0* 2.3* 2.3* 2.2* 2.2*   No results for input(s): "LIPASE", "AMYLASE" in the last 168 hours. No results for input(s): "AMMONIA" in the last 168 hours. CBC: Recent Labs  Lab 01/18/23 1940 01/19/23 0016 01/19/23 0948  WBC 13.8* 12.2* 12.4*  NEUTROABS 12.8*  --   --   HGB 10.5* 9.6* 10.8*  HCT 31.8* 29.2* 32.9*  MCV 82.2 83.0 84.4  PLT 147* 119* 129*   Cardiac Enzymes: No results for input(s): "CKTOTAL", "CKMB", "CKMBINDEX", "TROPONINI" in the last 168 hours. BNP: Invalid input(s): "POCBNP" CBG: Recent Labs  Lab 01/19/23 0531 01/20/23 2050  GLUCAP 93 113*   D-Dimer No results for input(s): "DDIMER" in  the last 72 hours. Hgb A1c No results for input(s): "HGBA1C" in the last 72 hours. Lipid Profile No results for input(s): "CHOL", "HDL", "LDLCALC", "TRIG", "CHOLHDL", "LDLDIRECT" in the last 72 hours. Thyroid function studies No results for input(s): "TSH", "T4TOTAL", "T3FREE", "THYROIDAB" in the last 72 hours.  Invalid input(s): "FREET3" Anemia work up No results for input(s): "VITAMINB12", "FOLATE", "FERRITIN", "TIBC", "IRON", "RETICCTPCT" in the last 72 hours. Urinalysis    Component Value Date/Time   COLORURINE YELLOW 01/24/2023 1719   APPEARANCEUR CLEAR 01/24/2023 1719   LABSPEC 1.011 01/24/2023 1719   PHURINE 6.0 01/24/2023 1719   GLUCOSEU NEGATIVE 01/24/2023 1719   HGBUR SMALL (A) 01/24/2023 1719   BILIRUBINUR NEGATIVE 01/24/2023 1719   KETONESUR NEGATIVE 01/24/2023 1719   PROTEINUR NEGATIVE 01/24/2023 1719   UROBILINOGEN 1.0 04/16/2020 2048   NITRITE NEGATIVE 01/24/2023 1719   LEUKOCYTESUR MODERATE (A) 01/24/2023 1719   Sepsis Labs Recent Labs  Lab 01/18/23 1940 01/19/23 0016 01/19/23 0948  WBC 13.8* 12.2* 12.4*   Microbiology Recent Results (from the past 240 hour(s))  Remove and replace urinary cath (placed > 5 days) then obtain urine culture from new indwelling urinary catheter.     Status: Abnormal   Collection Time: 01/18/23  8:08 PM   Specimen: Urine, Catheterized  Result Value Ref Range Status   Specimen Description URINE, CATHETERIZED  Final   Special Requests   Final    NONE Performed at [facility], [address], [place], [place] [address]    Culture MULTIPLE SPECIES PRESENT, SUGGEST RECOLLECTION (A)  Final   Report Status 01/20/2023 FINAL  Final  Culture, blood (Routine X 2) w Reflex to ID Panel     Status: Abnormal   Collection Time: 01/18/23 11:47 PM   Specimen: BLOOD  Result Value Ref Range Status   Specimen Description BLOOD SITE NOT SPECIFIED  Final   Special Requests   Final    BOTTLES DRAWN AEROBIC AND ANAEROBIC Blood Culture  results may not be optimal due to an excessive volume of blood received in culture bottles   Culture  Setup Time   Final    GRAM POSITIVE COCCI IN BOTH AEROBIC AND ANAEROBIC BOTTLES CRITICAL RESULT CALLED TO, READ BACK BY AND VERIFIED WITH: J LEDFORD,[email or web address]@[id number]  01/20/23 MK Performed at [facility], [address], [place], [place] [address]    Culture ENTEROCOCCUS FAECALIS (A)  Final   Report Status 01/22/2023 FINAL  Final   Organism ID, Bacteria ENTEROCOCCUS FAECALIS  Final      Susceptibility   Enterococcus faecalis - MIC*    AMPICILLIN <=2 SENSITIVE Sensitive     VANCOMYCIN 1 SENSITIVE Sensitive     GENTAMICIN SYNERGY SENSITIVE Sensitive     * ENTEROCOCCUS FAECALIS  Blood Culture ID Panel (Reflexed)     Status:  Abnormal   Collection Time: 01/18/23 11:47 PM  Result Value Ref Range Status   Enterococcus faecalis DETECTED (A) NOT DETECTED Final    Comment: CRITICAL RESULT CALLED TO, READ BACK BY AND VERIFIED WITH: J LEDFORD,[email or web address]@[id number]  01/20/23 MK    Enterococcus Faecium NOT DETECTED NOT DETECTED Final   Listeria monocytogenes NOT DETECTED NOT DETECTED Final   Staphylococcus species NOT DETECTED NOT DETECTED Final   Staphylococcus aureus (BCID) NOT DETECTED NOT DETECTED Final   Staphylococcus epidermidis NOT DETECTED NOT DETECTED Final   Staphylococcus lugdunensis NOT DETECTED NOT DETECTED Final   Streptococcus species NOT DETECTED NOT DETECTED Final   Streptococcus agalactiae NOT DETECTED NOT DETECTED Final   Streptococcus pneumoniae NOT DETECTED NOT DETECTED Final   Streptococcus pyogenes NOT DETECTED NOT DETECTED Final   A.calcoaceticus-baumannii NOT DETECTED NOT DETECTED Final   Bacteroides fragilis NOT DETECTED NOT DETECTED Final   Enterobacterales NOT DETECTED NOT DETECTED Final   Enterobacter cloacae complex NOT DETECTED NOT DETECTED Final   Escherichia coli NOT DETECTED NOT DETECTED Final   Klebsiella aerogenes NOT DETECTED NOT DETECTED Final   Klebsiella  oxytoca NOT DETECTED NOT DETECTED Final   Klebsiella pneumoniae NOT DETECTED NOT DETECTED Final   Proteus species NOT DETECTED NOT DETECTED Final   Salmonella species NOT DETECTED NOT DETECTED Final   Serratia marcescens NOT DETECTED NOT DETECTED Final   Haemophilus influenzae NOT DETECTED NOT DETECTED Final   Neisseria meningitidis NOT DETECTED NOT DETECTED Final   Pseudomonas aeruginosa NOT DETECTED NOT DETECTED Final   Stenotrophomonas maltophilia NOT DETECTED NOT DETECTED Final   Candida albicans NOT DETECTED NOT DETECTED Final   Candida auris NOT DETECTED NOT DETECTED Final   Candida glabrata NOT DETECTED NOT DETECTED Final   Candida krusei NOT DETECTED NOT DETECTED Final   Candida parapsilosis NOT DETECTED NOT DETECTED Final   Candida tropicalis NOT DETECTED NOT DETECTED Final   Cryptococcus neoformans/gattii NOT DETECTED NOT DETECTED Final   Vancomycin resistance NOT DETECTED NOT DETECTED Final    Comment: Performed at [facility], [address] [place], [place] [address]  Culture, blood (Routine X 2) w Reflex to ID Panel     Status: Abnormal   Collection Time: 01/19/23  9:48 AM   Specimen: BLOOD LEFT FOREARM  Result Value Ref Range Status   Specimen Description BLOOD LEFT FOREARM  Final   Special Requests   Final    BOTTLES DRAWN AEROBIC AND ANAEROBIC Blood Culture adequate volume   Culture  Setup Time   Final    ANAEROBIC BOTTLE ONLY GRAM POSITIVE COCCI CRITICAL VALUE NOTED.  VALUE IS CONSISTENT WITH PREVIOUSLY REPORTED AND CALLED VALUE.    Culture (A)  Final    ENTEROCOCCUS FAECALIS SUSCEPTIBILITIES PERFORMED ON PREVIOUS CULTURE WITHIN THE LAST 5 DAYS. Performed at [facility], [address] [place], [place] [address]    Report Status 01/22/2023 FINAL  Final  Culture, blood (Routine X 2) w Reflex to ID Panel     Status: None (Preliminary result)   Collection Time: 01/21/23  5:21 AM   Specimen: BLOOD LEFT HAND  Result Value Ref Range Status    Specimen Description BLOOD LEFT HAND  Final   Special Requests   Final    BOTTLES DRAWN AEROBIC AND ANAEROBIC Blood Culture adequate volume   Culture   Final    NO GROWTH 4 DAYS Performed at [facility], [address], [place], [place] [address]    Report Status PENDING  Incomplete  Culture, blood (Routine X 2) w Reflex to ID Panel     Status: None (Preliminary result)   Collection Time: 01/21/23  5:29 AM   Specimen: BLOOD  Result Value Ref Range Status   Specimen Description BLOOD BLOOD LEFT ARM  Final   Special Requests   Final    BOTTLES DRAWN AEROBIC AND ANAEROBIC Blood Culture adequate volume   Culture   Final    NO GROWTH 4 DAYS Performed at [facility], [address] [place], [place] [address]    Report Status PENDING  Incomplete    Procedures/Studies: US RENAL  Result Date: 01/24/2023 CLINICAL DATA:  Acute kidney injury EXAM: RENAL / URINARY TRACT ULTRASOUND COMPLETE COMPARISON:  CT abdomen and pelvis 01/18/2023 FINDINGS: Right Kidney: Renal measurements: 10.3 x 3.2 x 3.8 cm = volume: 65 mL. Echogenicity within normal limits. No mass or hydronephrosis visualized. Left Kidney: Renal measurements: 12.6 x 6.6 x 4.8 cm = volume: 176 mL. Echogenicity within normal limits. No mass or hydronephrosis visualized. There is fullness of the left renal pelvis, unchanged. Bladder: Appears normal for degree of bladder distention. Other: None. IMPRESSION: 1. No acute sonographic findings. Electronically Signed   By: Darliss Cheney M.D.   On: 01/24/2023 21:54   ECHOCARDIOGRAM COMPLETE  Result Date: 01/22/2023    ECHOCARDIOGRAM REPORT   Patient Name:   Theodore King Date of Exam: 01/22/2023 Medical Rec #:  [id number]         Height:       73.0 in Accession #:    [id number]        Weight:       190.3 lb Date of Birth:  1946-03-08         BSA:          2.106 m Patient Age:    77 years          BP:           162/72 mmHg Patient Gender: M                 HR:           62 bpm. Exam  Location:  Inpatient Procedure: 2D Echo, Cardiac Doppler and Color Doppler Indications:    Bacteremia R78.81  History:        Patient has prior history of Echocardiogram examinations, most                 recent 11/19/2021. Risk Factors:Hypertension, Dyslipidemia and                 Former Smoker.  Sonographer:    Dondra Prader RVT RCS Referring Phys: [id number] [facility] [place]  Sonographer Comments: Suboptimal parasternal window. Image acquisition challenging due to respiratory motion. IMPRESSIONS  1. Left ventricular ejection fraction, by estimation, is 60 to 65%. The left ventricle has normal function. The left ventricle has no regional wall motion abnormalities. Left ventricular diastolic parameters are consistent with Grade I diastolic dysfunction (impaired relaxation).  2. Right ventricular systolic function is normal. The right ventricular size is normal.  3. The mitral valve is normal in structure. Trivial mitral valve regurgitation. No evidence of mitral stenosis.  4. The aortic valve is tricuspid. Aortic valve regurgitation is moderate. Aortic valve sclerosis/calcification is present, without any evidence of aortic stenosis.  5. The inferior vena cava is normal in size with greater than 50% respiratory variability, suggesting right atrial pressure of 3 mmHg. FINDINGS  Left Ventricle: Left ventricular  ejection fraction, by estimation, is 60 to 65%. The left ventricle has normal function. The left ventricle has no regional wall motion abnormalities. The left ventricular internal cavity size was normal in size. There is  no left ventricular hypertrophy. Left ventricular diastolic parameters are consistent with Grade I diastolic dysfunction (impaired relaxation). Indeterminate filling pressures. Right Ventricle: The right ventricular size is normal. Right ventricular systolic function is normal. Left Atrium: Left atrial size was normal in size. Right Atrium: Right atrial size was normal in size. Pericardium: Trivial  pericardial effusion is present. Mitral Valve: The mitral valve is normal in structure. Mild mitral annular calcification. Trivial mitral valve regurgitation. No evidence of mitral valve stenosis. Tricuspid Valve: The tricuspid valve is normal in structure. Tricuspid valve regurgitation is trivial. No evidence of tricuspid stenosis. Aortic Valve: The aortic valve is tricuspid. Aortic valve regurgitation is moderate. Aortic regurgitation PHT measures 728 msec. Aortic valve sclerosis/calcification is present, without any evidence of aortic stenosis. Aortic valve mean gradient measures  6.0 mmHg. Aortic valve peak gradient measures 11.3 mmHg. Aortic valve area, by VTI measures 2.49 cm. Pulmonic Valve: The pulmonic valve was not well visualized. Pulmonic valve regurgitation is not visualized. No evidence of pulmonic stenosis. Aorta: The aortic root is normal in size and structure. Venous: The inferior vena cava is normal in size with greater than 50% respiratory variability, suggesting right atrial pressure of 3 mmHg. IAS/Shunts: No atrial level shunt detected by color flow Doppler.  LEFT VENTRICLE PLAX 2D LVIDd:         4.80 cm   Diastology LVIDs:         3.00 cm   LV e' medial:    4.70 cm/s LV PW:         1.15 cm   LV E/e' medial:  14.3 LV IVS:        0.95 cm   LV e' lateral:   7.22 cm/s LVOT diam:     1.90 cm   LV E/e' lateral: 9.3 LV SV:         91 LV SV Index:   43 LVOT Area:     2.84 cm  RIGHT VENTRICLE             IVC RV Basal diam:  4.30 cm     IVC diam: 1.70 cm RV Mid diam:    2.80 cm RV S prime:     19.90 cm/s TAPSE (M-mode): 2.7 cm LEFT ATRIUM           Index        RIGHT ATRIUM           Index LA diam:      3.60 cm 1.71 cm/m   RA Area:     21.10 cm LA Vol (A2C): 78.0 ml 37.03 ml/m  RA Volume:   57.30 ml  27.20 ml/m LA Vol (A4C): 86.2 ml 40.92 ml/m  AORTIC VALVE AV Area (Vmax):    2.54 cm AV Area (Vmean):   2.32 cm AV Area (VTI):     2.49 cm AV Vmax:           168.00 cm/s AV Vmean:          113.500  cm/s AV VTI:            0.364 m AV Peak Grad:      11.3 mmHg AV Mean Grad:      6.0 mmHg LVOT Vmax:         150.50 cm/s  LVOT Vmean:        92.950 cm/s LVOT VTI:          0.320 m LVOT/AV VTI ratio: 0.88 AI PHT:            728 msec AR Vena Contracta: 0.90 cm  AORTA Ao Root diam: 3.00 cm MITRAL VALVE MV Area (PHT): 2.66 cm    SHUNTS MV Decel Time: 285 msec    Systemic VTI:  0.32 m MV E velocity: 67.00 cm/s  Systemic Diam: 1.90 cm MV A velocity: 98.50 cm/s MV E/A ratio:  0.68 Olga Millers MD Electronically signed by Olga Millers MD Signature Date/Time: 01/22/2023/1:35:22 PM    Final    CT Renal Stone Study  Result Date: 01/18/2023 CLINICAL DATA:  Abdominal pain EXAM: CT ABDOMEN AND PELVIS WITHOUT CONTRAST TECHNIQUE: Multidetector CT imaging of the abdomen and pelvis was performed following the standard protocol without IV contrast. RADIATION DOSE REDUCTION: This exam was performed according to the departmental dose-optimization program which includes automated exposure control, adjustment of the mA and/or kV according to patient size and/or use of iterative reconstruction technique. COMPARISON:  01/31/2022 FINDINGS: Lower chest: Small pleural effusion with associated left lower lobe atelectasis. Hepatobiliary: Unenhanced liver is unremarkable. Gallbladder is unremarkable. No intrahepatic or extrahepatic duct dilatation. Pancreas: Grossly unremarkable. Spleen: Within normal limits. Adrenals/Urinary Tract: Adrenal glands are within normal limits. Kidneys are limits. No renal, ureteral, or bladder calculi. No hydronephrosis. Bladder decompressed by an indwelling Foley catheter. Stomach/Bowel: Stomach is within normal limits. No evidence of bowel obstruction. Appendix is not discretely visualized. No colonic wall thickening or inflammatory changes. Vascular/Lymphatic: No evidence of abdominal aortic aneurysm. Atherosclerotic calcifications of the abdominal aorta and branch vessels. No suspicious abdominopelvic  lymphadenopathy. Reproductive: Prostatomegaly, suggesting BPH, with dystrophic calcifications. Other: No abdominopelvic ascites. Musculoskeletal: Degenerative changes of the visualized thoracolumbar spine. IMPRESSION: No acute findings in the abdomen/pelvis. Suspected BPH. Small left pleural effusion with associated left lower lobe atelectasis. Electronically Signed   By: Charline Bills M.D.   On: 01/18/2023 23:09   DG Chest Portable 1 View  Result Date: 01/18/2023 CLINICAL DATA:  One day history of fatigue and generalized weakness EXAM: PORTABLE CHEST 1 VIEW COMPARISON:  Chest radiograph dated 08/13/2022 FINDINGS: Unchanged elevation of the left hemidiaphragm. Low lung volumes with bronchovascular crowding. Left basilar patchy opacities. No pleural effusion or pneumothorax. The heart size and mediastinal contours are within normal limits. No acute osseous abnormality. IMPRESSION: Low lung volumes with left basilar patchy opacities, likely atelectasis. Electronically Signed   By: Agustin Cree M.D.   On: 01/18/2023 19:23     Time coordinating discharge: Over 30 minutes    Lewie Chamber, MD  [facility] 01/25/2023, 11:47 AM

## 2023-01-25 NOTE — TOC Progression Note (Signed)
Transition of Care [facility]) - Progression Note    Patient Details  Name: Theodore King MRN: [id number] Date of Birth: 1945-12-02  [facility]) CM/SW Contact  Nettie Wyffels A [place], [place] Phone Number: 01/25/2023, 12:00 PM  Clinical Narrative:     CSW contacted pt's social worker, [name], [phone] regarding discharge to [facility] today. Facility is notified and can take pt back today. CSW left voicemail with [name] with contact information to return CSW's phone call.  TOC will continue to follow.   Expected Discharge Plan: Long Term Nursing Home Barriers to Discharge: Continued Medical Work up  Expected Discharge Plan and Services   Discharge Planning Services: CM Consult Post Acute Care Choice: Nursing Home Living arrangements for the past 2 months: Skilled Nursing Facility (LTC resident at [facility]) Expected Discharge Date: 01/25/23                                     Social Determinants of Health (SDOH) Interventions SDOH Screenings   Food Insecurity: No Food Insecurity (01/19/2023)  Housing: Low Risk  (01/19/2023)  Transportation Needs: No Transportation Needs (01/19/2023)  Utilities: Not At Risk (01/19/2023)  Tobacco Use: Medium Risk (01/18/2023)    Readmission Risk Interventions    01/19/2023    2:05 PM 04/08/2022    9:56 AM 04/07/2022    8:15 AM  Readmission Risk Prevention Plan  Transportation Screening Complete Complete Complete  PCP or Specialist Appt within 3-5 Days Complete    HRI or Home Care Consult Complete    Social Work Consult for Recovery Care Planning/Counseling Complete    Palliative Care Screening Complete    Medication Review [employer]) Complete Complete Complete  PCP or Specialist appointment within 3-5 days of discharge  Complete Complete  HRI or Home Care Consult  Complete Complete  SW Recovery Care/Counseling Consult  Complete Complete  Palliative Care Screening   Not Applicable  Skilled Nursing Facility  Not  Applicable Not Applicable

## 2023-01-26 LAB — CULTURE, BLOOD (ROUTINE X 2)
Culture: NO GROWTH
Culture: NO GROWTH
Special Requests: ADEQUATE
Special Requests: ADEQUATE

## 2023-05-21 IMAGING — CT CT HEAD W/O CM
3 series · 15 of 47 positions shown, 18 images · non-contrast
Comparison: 05/28/2021

CLINICAL DATA: Fall, head and neck trauma



[Series 1: head wo · axial · 0.47mm/px · z∈[+168,+293]mm · 9 of 30 slices shown, 12 images]
[im 3/30  brain]
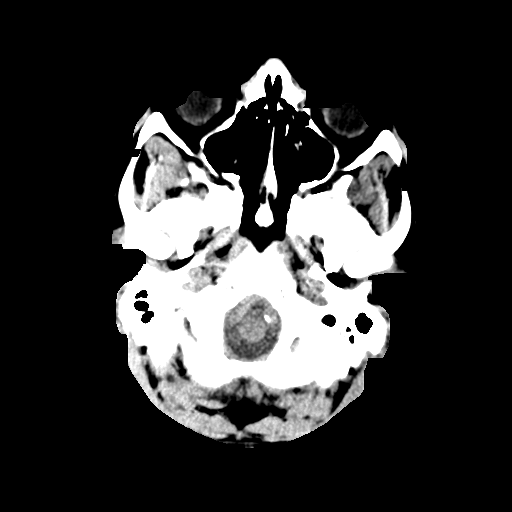
[im 3/30  bone]
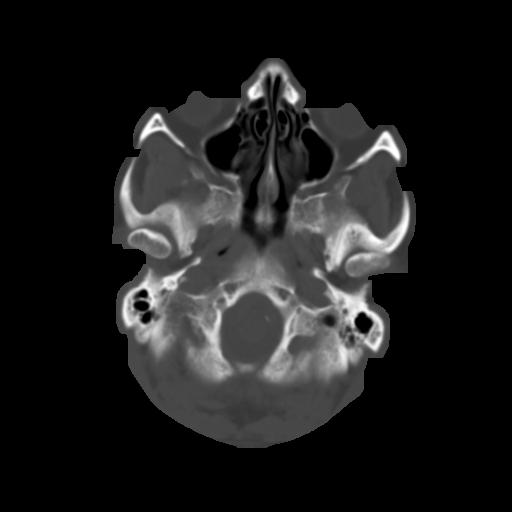
[im 6/30  brain]
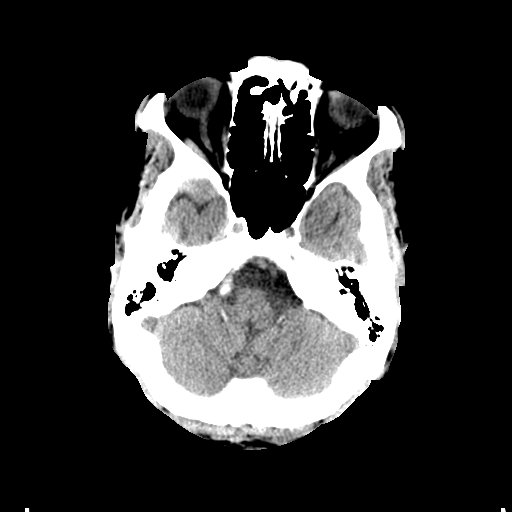
[im 9/30  brain]
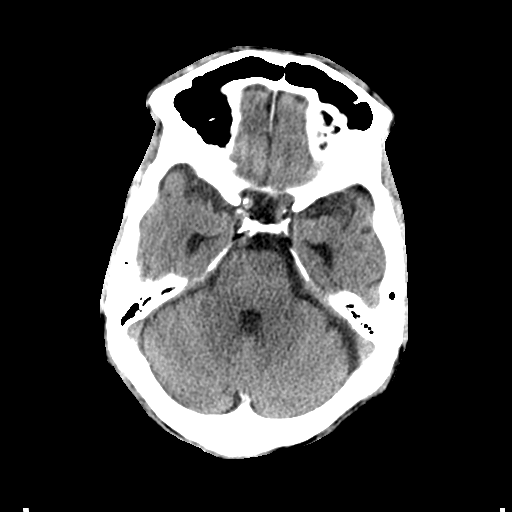
[im 12/30  brain]
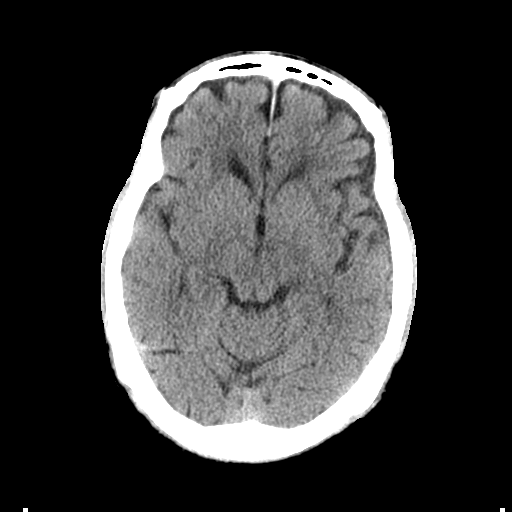
[im 16/30  brain]
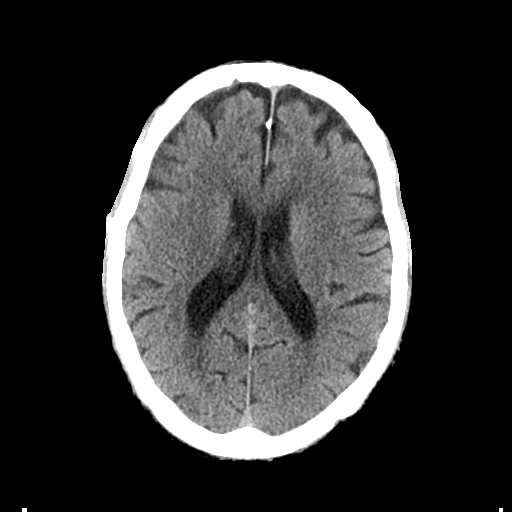
[im 16/30  bone]
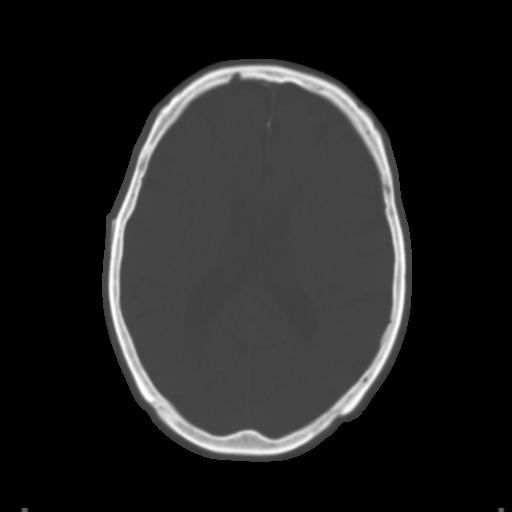
[im 19/30  brain]
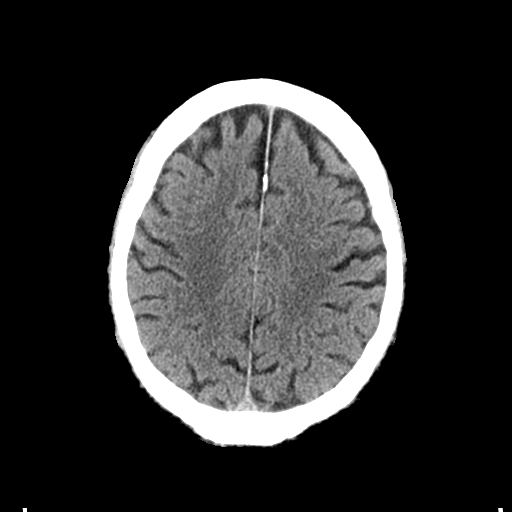
[im 22/30  brain]
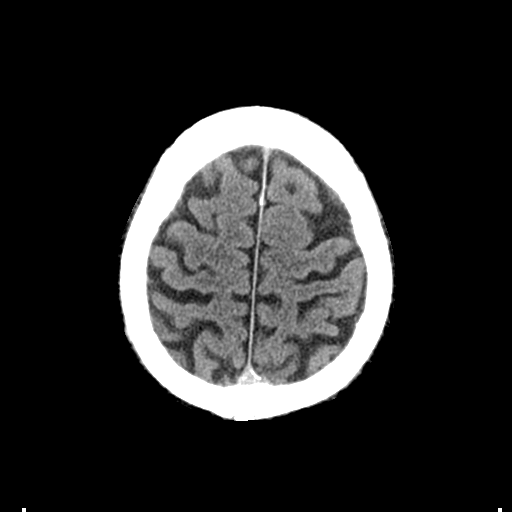
[im 25/30  brain]
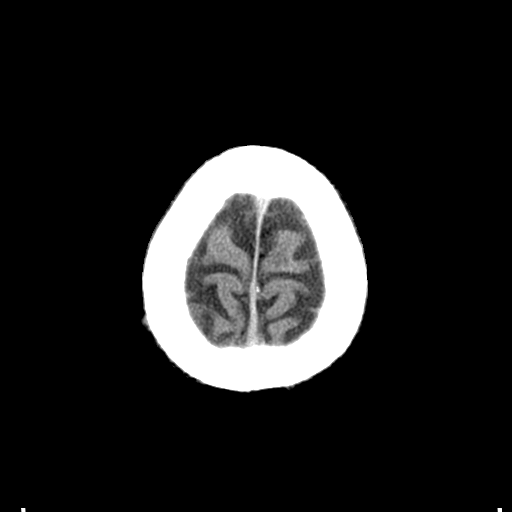
[im 28/30  brain]
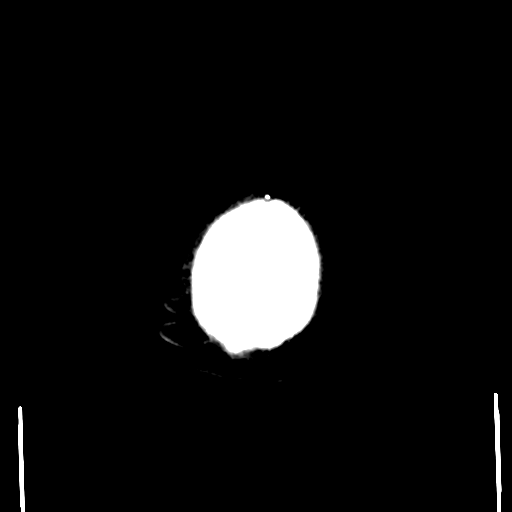
[im 28/30  bone]
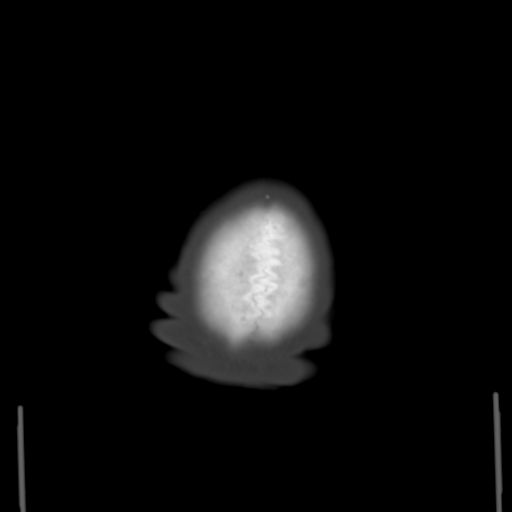

[Series 5: coronal soft tissue · coronal · 0.32mm/px · 3 of 73 slices shown]
[im 25/73  brain]
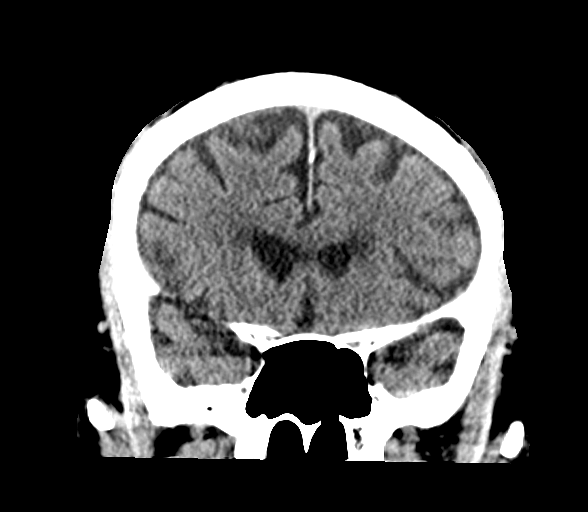
[im 33/73  brain]
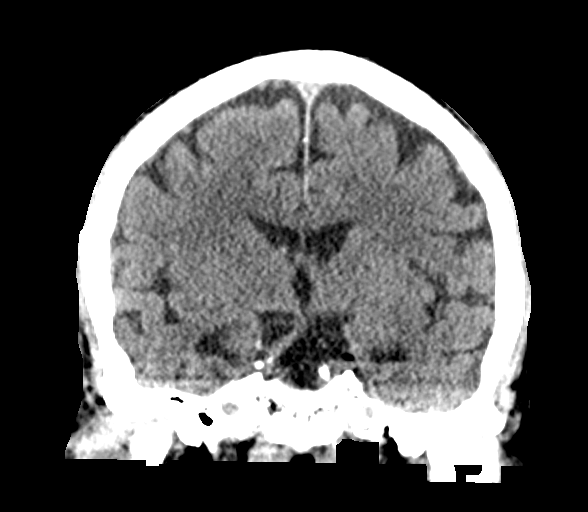
[im 41/73  brain]
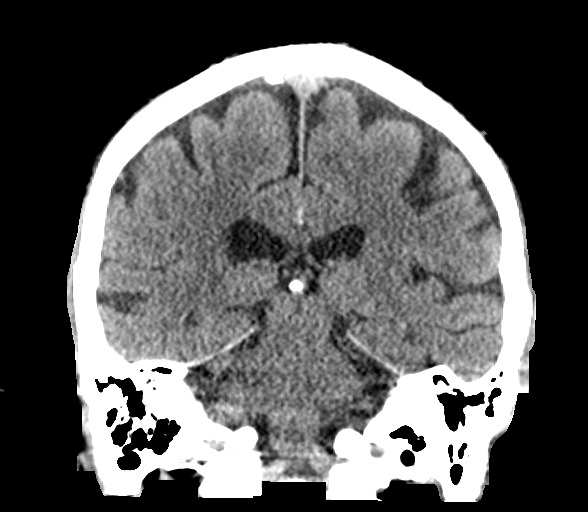

[Series 6: sagittal soft tissue · sagittal · 0.32mm/px · 3 of 63 slices shown]
[im 21/63  brain]
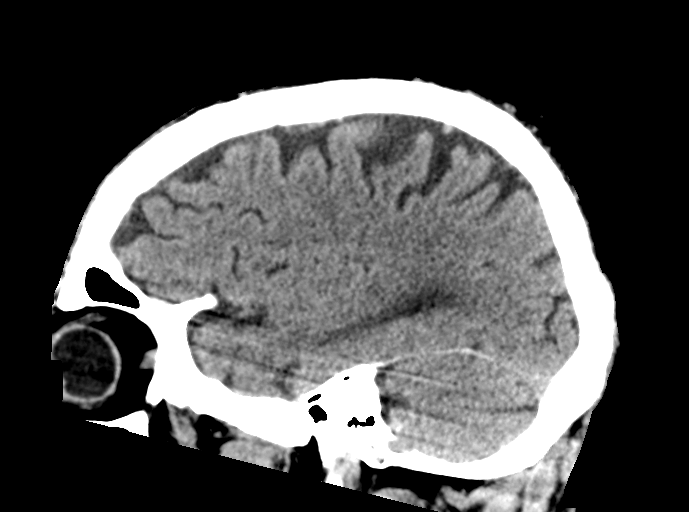
[im 32/63  brain]
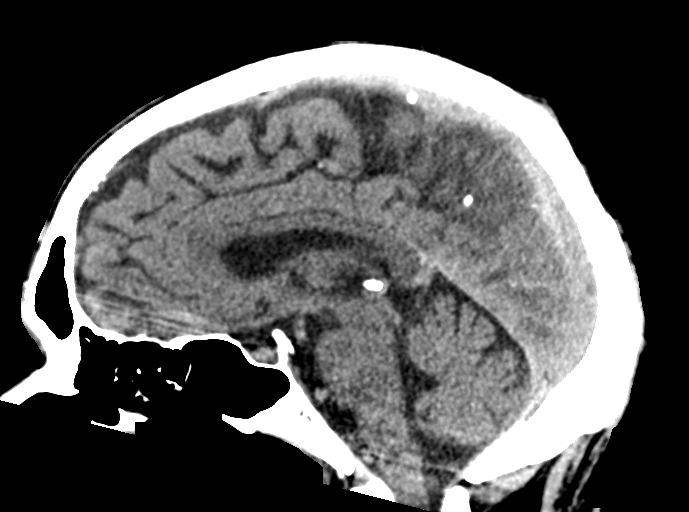
[im 42/63  brain]
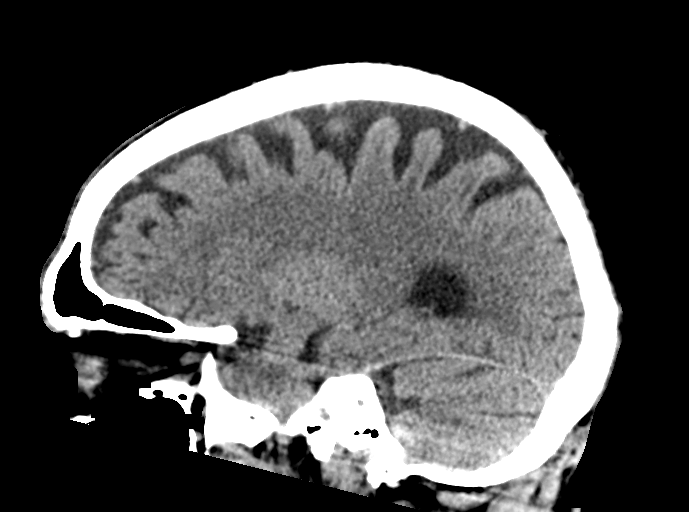

[15 of 47 positions shown; findings below may reference images not displayed]

FINDINGS: CT HEAD FINDINGS

Brain: No evidence of acute infarction, hemorrhage, hydrocephalus,
extra-axial collection or mass lesion/mass effect. Periventricular
white matter hypodensity.

Vascular: No hyperdense vessel or unexpected calcification.

Skull: Normal. Negative for fracture or focal lesion.

Sinuses/Orbits: No acute finding.

Other: None.

CT CERVICAL SPINE FINDINGS

Alignment: Normal.

Skull base and vertebrae: No acute fracture. No primary bone lesion
or focal pathologic process.

Soft tissues and spinal canal: No prevertebral fluid or swelling. No
visible canal hematoma.

Disc levels: Moderate to severe multilevel disc space height loss
and osteophytosis throughout, with sizable anterior bridging
osteophytes.

Upper chest: Negative.

Other: None.
IMPRESSION: 1. No acute intracranial pathology. Small-vessel white matter
disease.
2. No fracture or static subluxation of the cervical spine.
3. Moderate to severe multilevel cervical disc degenerative disease.

## 2023-05-21 IMAGING — CT CT CERVICAL SPINE W/O CM
3 of 4 series · 12 of 33 positions shown, 14 images · non-contrast
Comparison: 05/28/2021

CLINICAL DATA: Fall, head and neck trauma



[Series 5: orthogonal bone · axial · 0.23mm/px · z∈[+37,+146]mm · 4 of 91 slices shown, 5 images]
[im 13/91  soft-tissue]
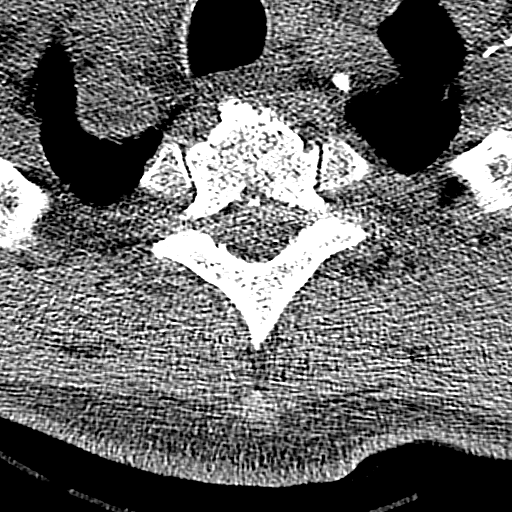
[im 13/91  bone]
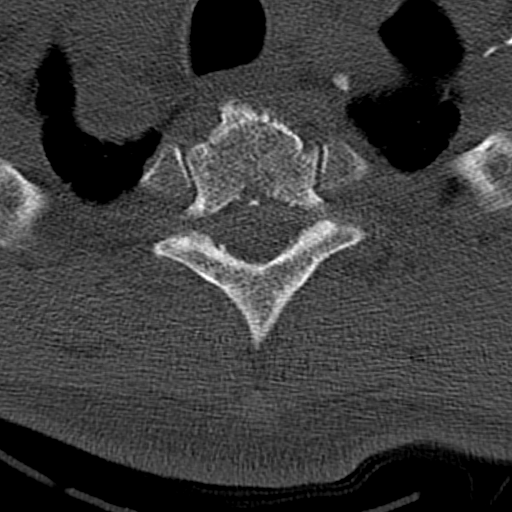
[im 39/91  bone]
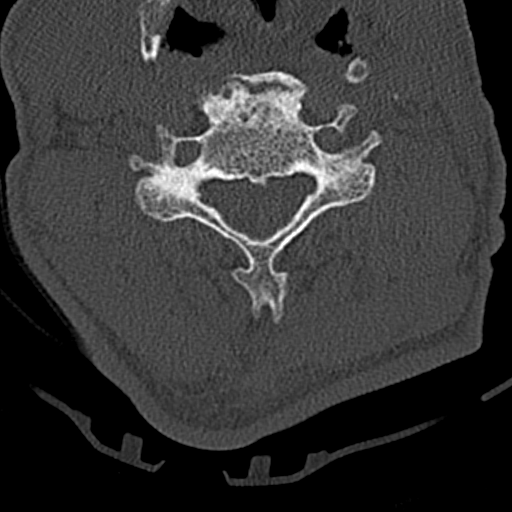
[im 52/91  bone]
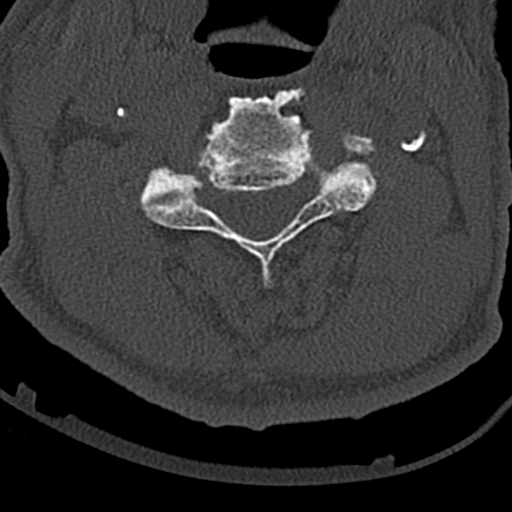
[im 78/91  bone]
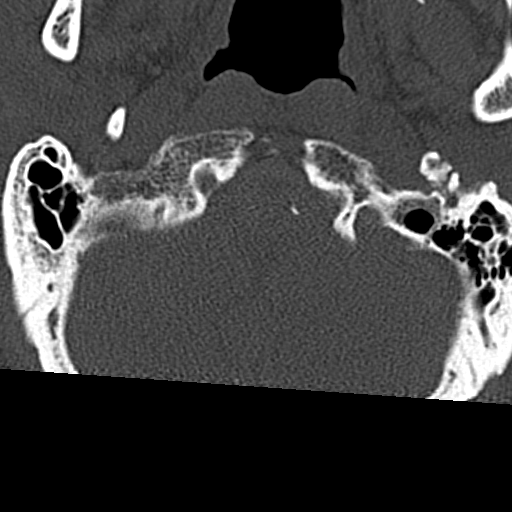

[Series 6: coronal bone · coronal · 0.23mm/px · 3 of 61 slices shown]
[im 17/61  bone]
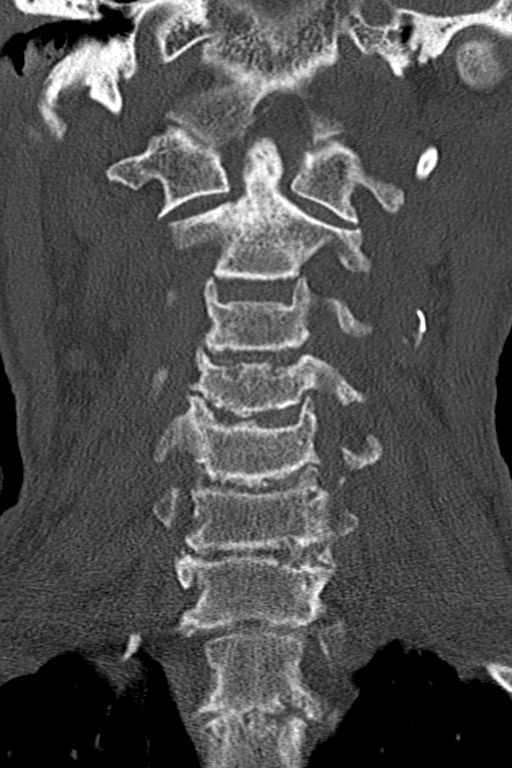
[im 26/61  bone]
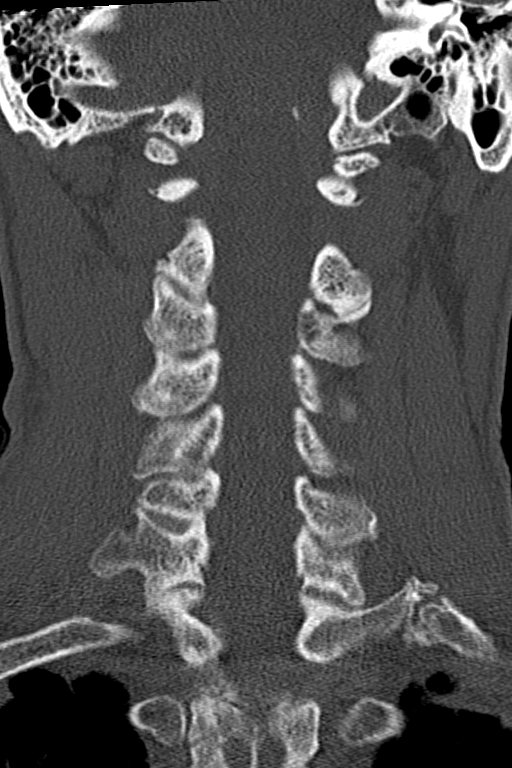
[im 35/61  bone]
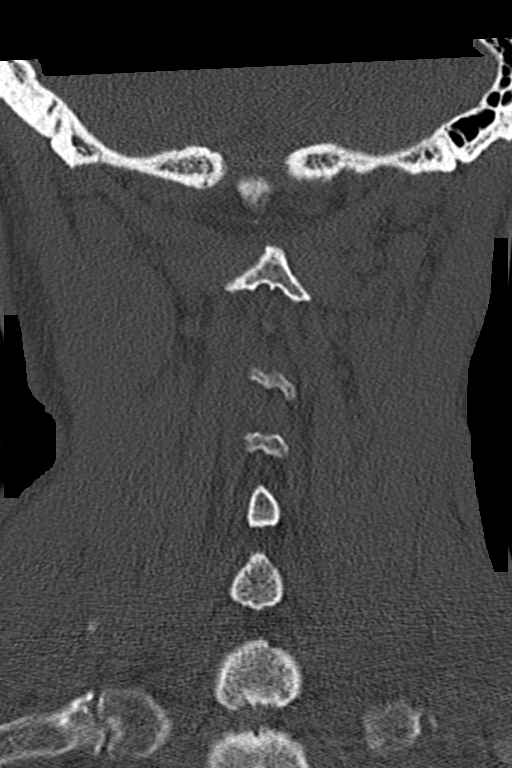

[Series 7: sagittal bone · sagittal · 0.23mm/px · 5 of 61 slices shown, 6 images]
[im 21/61  bone]
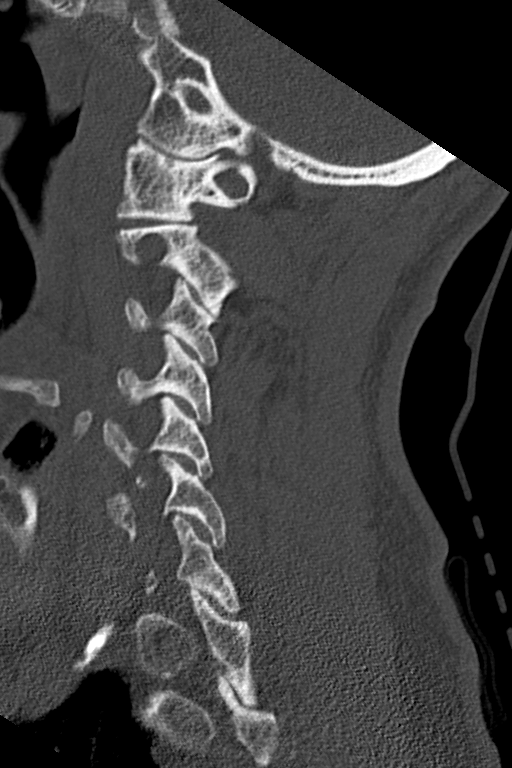
[im 26/61  bone]
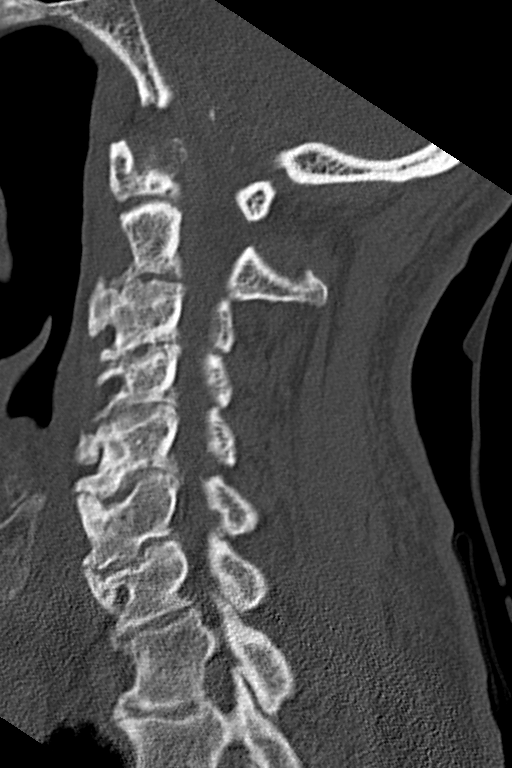
[im 31/61  soft-tissue]
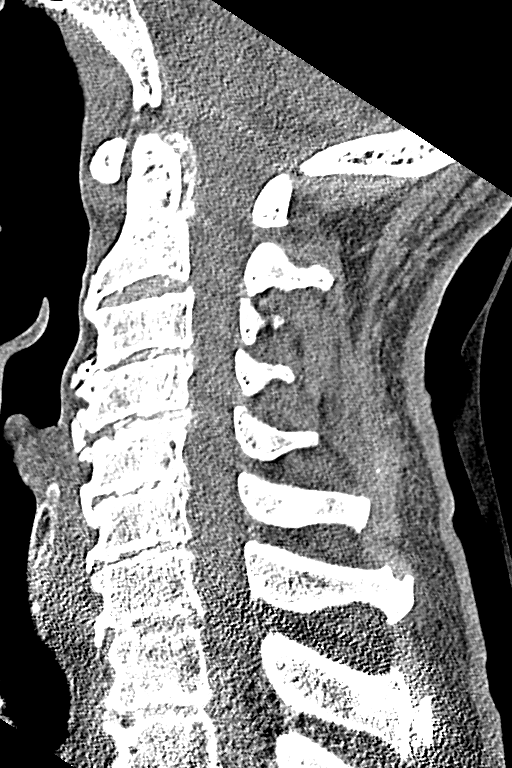
[im 31/61  bone]
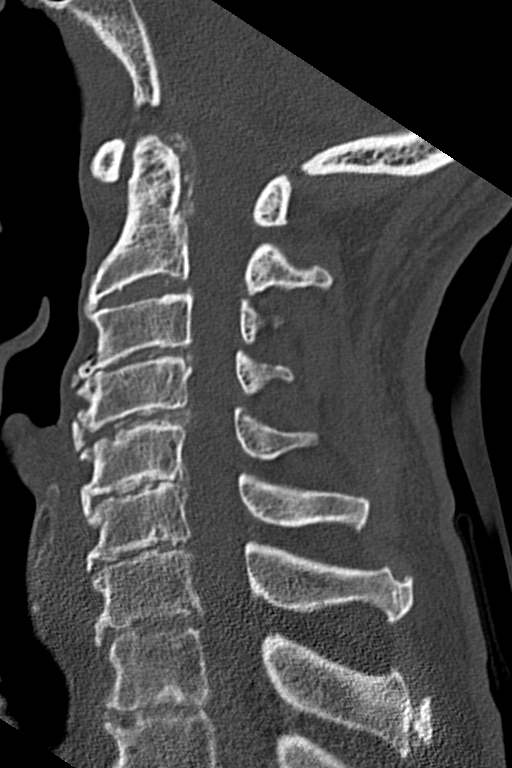
[im 36/61  bone]
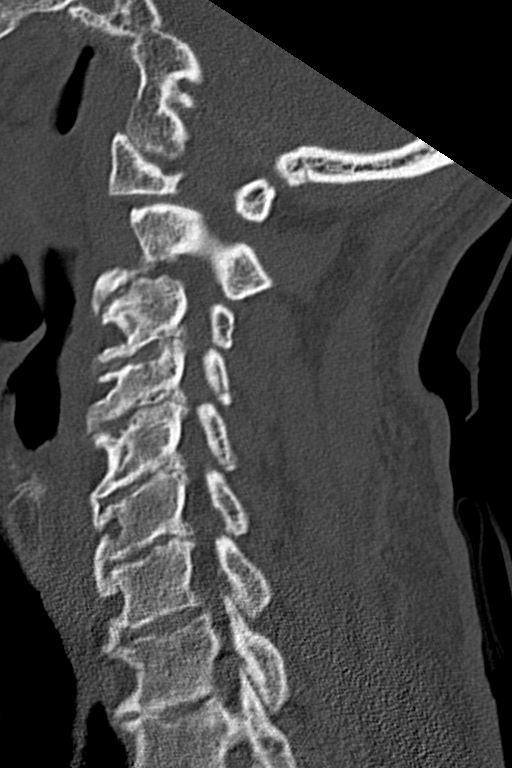
[im 41/61  bone]
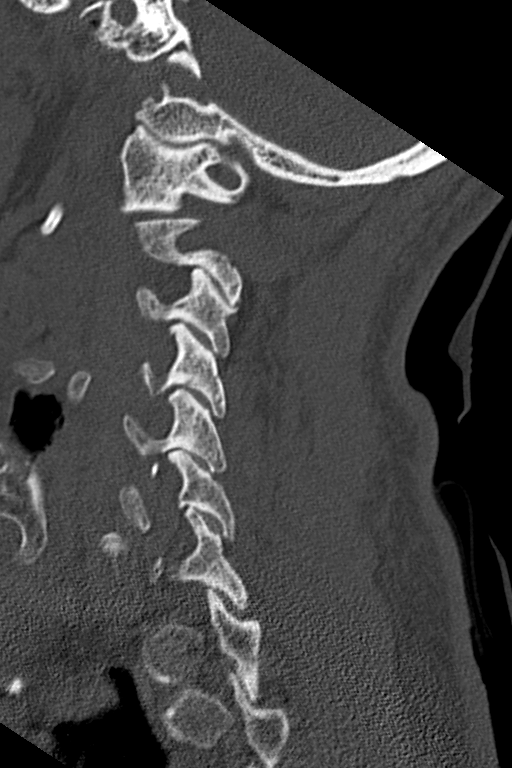

[12 of 33 positions shown; findings below may reference images not displayed]

FINDINGS: CT HEAD FINDINGS

Brain: No evidence of acute infarction, hemorrhage, hydrocephalus,
extra-axial collection or mass lesion/mass effect. Periventricular
white matter hypodensity.

Vascular: No hyperdense vessel or unexpected calcification.

Skull: Normal. Negative for fracture or focal lesion.

Sinuses/Orbits: No acute finding.

Other: None.

CT CERVICAL SPINE FINDINGS

Alignment: Normal.

Skull base and vertebrae: No acute fracture. No primary bone lesion
or focal pathologic process.

Soft tissues and spinal canal: No prevertebral fluid or swelling. No
visible canal hematoma.

Disc levels: Moderate to severe multilevel disc space height loss
and osteophytosis throughout, with sizable anterior bridging
osteophytes.

Upper chest: Negative.

Other: None.
IMPRESSION: 1. No acute intracranial pathology. Small-vessel white matter
disease.
2. No fracture or static subluxation of the cervical spine.
3. Moderate to severe multilevel cervical disc degenerative disease.

## 2023-07-16 ENCOUNTER — Other Ambulatory Visit: Payer: Self-pay

## 2023-07-16 ENCOUNTER — Inpatient Hospital Stay (HOSPITAL_COMMUNITY)
Admission: EM | Admit: 2023-07-16 | Discharge: 2023-07-22 | DRG: 699 | Disposition: A | Discharge status: Skilled Nursing Facility | Payer: Medicare (Managed Care) | Source: Skilled Nursing Facility | Attending: Internal Medicine | Admitting: Internal Medicine

## 2023-07-16 DIAGNOSIS — N401 Enlarged prostate with lower urinary tract symptoms: Secondary | ICD-10-CM | POA: Diagnosis present

## 2023-07-16 DIAGNOSIS — Z555 Less than a high school diploma: Secondary | ICD-10-CM | POA: Diagnosis not present

## 2023-07-16 DIAGNOSIS — D62 Acute posthemorrhagic anemia: Secondary | ICD-10-CM | POA: Diagnosis not present

## 2023-07-16 DIAGNOSIS — I129 Hypertensive chronic kidney disease with stage 1 through stage 4 chronic kidney disease, or unspecified chronic kidney disease: Secondary | ICD-10-CM | POA: Diagnosis present

## 2023-07-16 DIAGNOSIS — Y846 Urinary catheterization as the cause of abnormal reaction of the patient, or of later complication, without mention of misadventure at the time of the procedure: Secondary | ICD-10-CM | POA: Diagnosis not present

## 2023-07-16 DIAGNOSIS — Y92239 Unspecified place in hospital as the place of occurrence of the external cause: Secondary | ICD-10-CM | POA: Diagnosis not present

## 2023-07-16 DIAGNOSIS — N1832 Chronic kidney disease, stage 3b: Secondary | ICD-10-CM | POA: Diagnosis present

## 2023-07-16 DIAGNOSIS — Z79899 Other long term (current) drug therapy: Secondary | ICD-10-CM

## 2023-07-16 DIAGNOSIS — N368 Other specified disorders of urethra: Secondary | ICD-10-CM | POA: Diagnosis present

## 2023-07-16 DIAGNOSIS — D631 Anemia in chronic kidney disease: Secondary | ICD-10-CM | POA: Diagnosis present

## 2023-07-16 DIAGNOSIS — E860 Dehydration: Secondary | ICD-10-CM | POA: Diagnosis present

## 2023-07-16 DIAGNOSIS — N35919 Unspecified urethral stricture, male, unspecified site: Secondary | ICD-10-CM | POA: Diagnosis present

## 2023-07-16 DIAGNOSIS — N179 Acute kidney failure, unspecified: Secondary | ICD-10-CM | POA: Diagnosis present

## 2023-07-16 DIAGNOSIS — Z8616 Personal history of COVID-19: Secondary | ICD-10-CM | POA: Diagnosis not present

## 2023-07-16 DIAGNOSIS — R339 Retention of urine, unspecified: Secondary | ICD-10-CM | POA: Diagnosis present

## 2023-07-16 DIAGNOSIS — I10 Essential (primary) hypertension: Secondary | ICD-10-CM | POA: Diagnosis present

## 2023-07-16 DIAGNOSIS — G20A1 Parkinson's disease without dyskinesia, without mention of fluctuations: Secondary | ICD-10-CM | POA: Diagnosis present

## 2023-07-16 DIAGNOSIS — Z860100 Personal history of colon polyps, unspecified: Secondary | ICD-10-CM

## 2023-07-16 DIAGNOSIS — T83021A Displacement of indwelling urethral catheter, initial encounter: Principal | ICD-10-CM | POA: Diagnosis present

## 2023-07-16 DIAGNOSIS — R338 Other retention of urine: Secondary | ICD-10-CM | POA: Diagnosis present

## 2023-07-16 DIAGNOSIS — F0280 Dementia in other diseases classified elsewhere without behavioral disturbance: Secondary | ICD-10-CM | POA: Diagnosis not present

## 2023-07-16 DIAGNOSIS — Z9049 Acquired absence of other specified parts of digestive tract: Secondary | ICD-10-CM

## 2023-07-16 DIAGNOSIS — Z87891 Personal history of nicotine dependence: Secondary | ICD-10-CM

## 2023-07-16 DIAGNOSIS — E785 Hyperlipidemia, unspecified: Secondary | ICD-10-CM | POA: Diagnosis present

## 2023-07-16 DIAGNOSIS — G4700 Insomnia, unspecified: Secondary | ICD-10-CM | POA: Diagnosis present

## 2023-07-16 DIAGNOSIS — G20B1 Parkinson's disease with dyskinesia, without mention of fluctuations: Secondary | ICD-10-CM | POA: Diagnosis not present

## 2023-07-16 DIAGNOSIS — F02818 Dementia in other diseases classified elsewhere, unspecified severity, with other behavioral disturbance: Secondary | ICD-10-CM | POA: Diagnosis present

## 2023-07-16 DIAGNOSIS — T8383XA Hemorrhage of genitourinary prosthetic devices, implants and grafts, initial encounter: Secondary | ICD-10-CM | POA: Diagnosis not present

## 2023-07-16 DIAGNOSIS — Z978 Presence of other specified devices: Principal | ICD-10-CM

## 2023-07-16 DIAGNOSIS — Y738 Miscellaneous gastroenterology and urology devices associated with adverse incidents, not elsewhere classified: Secondary | ICD-10-CM | POA: Diagnosis present

## 2023-07-16 DIAGNOSIS — D638 Anemia in other chronic diseases classified elsewhere: Secondary | ICD-10-CM | POA: Diagnosis not present

## 2023-07-16 DIAGNOSIS — I700 Atherosclerosis of aorta: Secondary | ICD-10-CM | POA: Diagnosis present

## 2023-07-16 LAB — CBC WITH DIFFERENTIAL/PLATELET
Abs Immature Granulocytes: 0.03 10*3/uL (ref 0.00–0.07)
Basophils Absolute: 0 10*3/uL (ref 0.0–0.1)
Basophils Relative: 1 %
Eosinophils Absolute: 0.1 10*3/uL (ref 0.0–0.5)
Eosinophils Relative: 2 %
HCT: 37 % — ABNORMAL LOW (ref 39.0–52.0)
Hemoglobin: 11.3 g/dL — ABNORMAL LOW (ref 13.0–17.0)
Immature Granulocytes: 0 %
Lymphocytes Relative: 26 %
Lymphs Abs: 1.8 10*3/uL (ref 0.7–4.0)
MCH: 26.5 pg (ref 26.0–34.0)
MCHC: 30.5 g/dL (ref 30.0–36.0)
MCV: 86.7 fL (ref 80.0–100.0)
Monocytes Absolute: 0.5 10*3/uL (ref 0.1–1.0)
Monocytes Relative: 7 %
Neutro Abs: 4.3 10*3/uL (ref 1.7–7.7)
Neutrophils Relative %: 64 %
Platelets: 175 10*3/uL (ref 150–400)
RBC: 4.27 MIL/uL (ref 4.22–5.81)
RDW: 13.7 % (ref 11.5–15.5)
WBC: 6.7 10*3/uL (ref 4.0–10.5)
nRBC: 0 % (ref 0.0–0.2)

## 2023-07-16 LAB — BASIC METABOLIC PANEL
Anion gap: 6 (ref 5–15)
BUN: 32 mg/dL — ABNORMAL HIGH (ref 8–23)
CO2: 23 mmol/L (ref 22–32)
Calcium: 9.2 mg/dL (ref 8.9–10.3)
Chloride: 109 mmol/L (ref 98–111)
Creatinine, Ser: 1.89 mg/dL — ABNORMAL HIGH (ref 0.61–1.24)
GFR, Estimated: 36 mL/min — ABNORMAL LOW (ref >60)
Glucose, Bld: 146 mg/dL — ABNORMAL HIGH (ref 70–99)
Potassium: 4.6 mmol/L (ref 3.5–5.1)
Sodium: 138 mmol/L (ref 135–145)

## 2023-07-16 MED ORDER — ALBUTEROL SULFATE HFA 108 (90 BASE) MCG/ACT IN AERS: 2.0000 | INHALATION_SPRAY | Freq: Four times a day (QID) | RESPIRATORY_TRACT | Status: DC | PRN

## 2023-07-16 MED ORDER — SENNOSIDES-DOCUSATE SODIUM 8.6-50 MG PO TABS: 1.0000 | ORAL_TABLET | Freq: Every evening | ORAL | Status: DC | PRN

## 2023-07-16 MED ORDER — OXYCODONE HCL 5 MG PO TABS
5.0000 mg | ORAL_TABLET | Freq: Four times a day (QID) | ORAL | Status: DC | PRN
  Filled 2023-07-16: qty 1

## 2023-07-16 MED ORDER — TRAZODONE HCL 50 MG PO TABS
50.0000 mg | ORAL_TABLET | Freq: Every day | ORAL | Status: DC
  Administered 2023-07-16 – 2023-07-21 (×6): 50 mg via ORAL
  Filled 2023-07-16 (×6): qty 1

## 2023-07-16 MED ORDER — SENNOSIDES-DOCUSATE SODIUM 8.6-50 MG PO TABS
1.0000 | ORAL_TABLET | Freq: Every day | ORAL | Status: DC
  Administered 2023-07-16 – 2023-07-21 (×6): 1 via ORAL
  Filled 2023-07-16 (×6): qty 1

## 2023-07-16 MED ORDER — ALBUTEROL SULFATE (2.5 MG/3ML) 0.083% IN NEBU: 2.5000 mg | INHALATION_SOLUTION | Freq: Four times a day (QID) | RESPIRATORY_TRACT | Status: DC | PRN

## 2023-07-16 MED ORDER — ACETAMINOPHEN 650 MG RE SUPP: 650.0000 mg | Freq: Four times a day (QID) | RECTAL | Status: DC | PRN

## 2023-07-16 MED ORDER — CARBIDOPA-LEVODOPA 25-100 MG PO TABS
1.0000 | ORAL_TABLET | Freq: Every day | ORAL | Status: DC
  Administered 2023-07-18 – 2023-07-21 (×4): 1 via ORAL
  Filled 2023-07-16 (×5): qty 1

## 2023-07-16 MED ORDER — ACETAMINOPHEN 325 MG PO TABS
650.0000 mg | ORAL_TABLET | Freq: Four times a day (QID) | ORAL | Status: DC | PRN
  Administered 2023-07-16 – 2023-07-21 (×2): 650 mg via ORAL
  Filled 2023-07-16 (×2): qty 2

## 2023-07-16 MED ORDER — ENOXAPARIN SODIUM 40 MG/0.4ML IJ SOSY
40.0000 mg | PREFILLED_SYRINGE | Freq: Every day | INTRAMUSCULAR | Status: DC
  Administered 2023-07-16 – 2023-07-19 (×4): 40 mg via SUBCUTANEOUS
  Filled 2023-07-16 (×4): qty 0.4

## 2023-07-16 MED ORDER — ONDANSETRON HCL 4 MG/2ML IJ SOLN: 4.0000 mg | Freq: Four times a day (QID) | INTRAMUSCULAR | Status: DC | PRN

## 2023-07-16 MED ORDER — CARBIDOPA-LEVODOPA 25-100 MG PO TABS
2.0000 | ORAL_TABLET | Freq: Two times a day (BID) | ORAL | Status: DC
  Administered 2023-07-16 – 2023-07-22 (×12): 2 via ORAL
  Filled 2023-07-16 (×13): qty 2

## 2023-07-16 MED ORDER — ONDANSETRON HCL 4 MG PO TABS: 4.0000 mg | ORAL_TABLET | Freq: Four times a day (QID) | ORAL | Status: DC | PRN

## 2023-07-16 MED ORDER — SODIUM CHLORIDE 0.9 % IV BOLUS
500.0000 mL | Freq: Once | INTRAVENOUS | Status: AC
  Administered 2023-07-16: 500 mL via INTRAVENOUS

## 2023-07-16 MED ORDER — LIDOCAINE HCL URETHRAL/MUCOSAL 2 % EX GEL
1.0000 | Freq: Once | CUTANEOUS | Status: AC
  Administered 2023-07-16: 1 via URETHRAL
  Filled 2023-07-16: qty 11

## 2023-07-16 NOTE — Consult Note (Signed)
 Urology Consult Note   Requesting Attending Physician:  Randol Simmonds, MD Service Providing Consult: Urology  Consulting Attending: Dr. 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 male with past medical history significant for Parkinson disease.  He also has an enlarged prostate which was seen on imaging back 2023.  [facility] given his ongoing urinary retention.  He has been managed with an 29 French catheter for the past year.  Unfortunately, he has developed significant urethral erosion.  At his living facility, his catheter was noted to have fallen out.  It appears that it was most recently exchanged in January 2025.  In the emergency department, the ED team and nursing staff was unable to place Foley catheter, and urology was called to assist.  Note, his bladder scan in the ED was 102.  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   SOB (shortness of breath) 03/23/2018   Unspecified vitamin D  deficiency    Unspecified vitamin D  deficiency    Vertigo, benign paroxysmal 11/05/2016   Vitamin D  deficiency 05/18/2013    Past Surgical History:  Past Surgical History:  Procedure Laterality Date   APPENDECTOMY      COLONOSCOPY WITH PROPOFOL  N/A 10/26/2021   Procedure: COLONOSCOPY WITH PROPOFOL ;  Surgeon: Abran Norleen SAILOR, MD;  Location: [facility];  Service: Gastroenterology;  Laterality: N/A;   POLYPECTOMY  10/26/2021   Procedure: POLYPECTOMY;  Surgeon: Abran Norleen SAILOR, MD;  Location: [facility];  Service: Gastroenterology;;   MARCELINE CUFF REPAIR     right    Medication: No current facility-administered medications for this encounter.   Current Outpatient Medications  Medication Sig Dispense Refill   acetaminophen  (TYLENOL ) 500 MG tablet Take 1,000 mg by mouth 2 (two) times daily.     albuterol  (VENTOLIN  HFA) 108 (90 Base) MCG/ACT inhaler Inhale 2 puffs into the lungs every 6 (six) hours as needed for wheezing.     bisacodyl  (DULCOLAX) 10 MG suppository Place 10 mg rectally daily as needed (constipation not relieved by Milk of Magnesia).     carbidopa -levodopa  (SINEMET  CR) 50-200 MG tablet Take 1 tablet by mouth at bedtime. 90 tablet 4   carbidopa -levodopa  (SINEMET  IR) 25-100 MG tablet Take 2 tablets at 7 AM, 2 tablets at 10 AM, 2 tablets at 2 PM and 1 tablet at 6 PM (Patient taking differently: Take 2 tablets by mouth 3 (three) times daily. 1000, 1400, 1800) 210 tablet 12   carbidopa -levodopa  (SINEMET  IR) 25-100 MG tablet Take 2 tablets by mouth daily at 12 noon. 0700     glycopyrrolate  (ROBINUL ) 1 MG tablet Take 1 tablet (1 mg total) by mouth 2 (two) times daily. 60 tablet 6   Magnesium  Hydroxide (MILK OF MAGNESIA  PO) Take 30 mLs by mouth daily as needed (constipation).     nystatin  (MYCOSTATIN /NYSTOP ) powder Apply 1 Application topically 3 (three) times daily as needed (moisture associated dermatitis). Apply to groin area and skin folds.     sertraline  (ZOLOFT ) 25 MG tablet Take 1 tablet (25 mg total) by mouth daily. 30 tablet 6   Sodium Phosphates  (ENEMA RE) Place 1 enema rectally daily as needed (constipation not relieved by bisacodyl  suppository).     traZODone  (DESYREL ) 50 MG tablet Take 50 mg by  mouth at bedtime.     UNABLE TO FIND Apply 1 application  topically daily. Med Name: Lumiderm Advanced Therapy Apply topically to all extremities daily. Apply topically to all extremities four times daily as needed for dry skin/skin integrity.     Vitamin D , Ergocalciferol , (DRISDOL) 1.25 MG (50000 UNIT) CAPS capsule Take 50,000 Units by mouth every 30 (thirty) days. Once a month on the 3rd.      Allergies: No Known Allergies  Social History: Social History   Tobacco Use   Smoking status: Former    Current packs/day: 0.00    Average packs/day: 0.5 packs/day for 20.8 years (10.4 ttl pk-yrs)    Types: Cigarettes    Start date: 08/14/1953    Quit date: 05/23/1974    Years since quitting: 49.1   Smokeless tobacco: Never  Vaping Use   Vaping status: Never Used  Substance Use Topics   Alcohol use: Not Currently   Drug use: No    Family History Family History  Problem Relation Age of Onset   Cancer Mother    Cancer Father    Stroke Brother    Cancer Sister    Healthy Daughter    Cancer Sister    Lung cancer Brother    Healthy Daughter    Colon cancer Neg Hx    Esophageal cancer Neg Hx    Rectal cancer Neg Hx    Stomach cancer Neg Hx     Review of Systems 10 systems were reviewed and are negative except as noted specifically in the HPI.  Objective   Vital signs in last 24 hours: BP (!) 163/56   Pulse 60   Temp 97.8 F (36.6 C) (Oral)   Resp 17   SpO2 97%   Physical Exam General: NAD,  resting. HEENT: [place]/AT, EOMI, MMM Pulmonary: Normal work of breathing Cardiovascular: HDS, adequate peripheral perfusion Abdomen: Soft, NTTP, nondistended. 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, No CVA tenderness Extremities: warm and well perfused Neuro: Appropriate, no focal neurological deficits  Most Recent Labs: Lab Results  Component Value Date   WBC 6.7 07/16/2023   HGB 11.3 (L) 07/16/2023   HCT 37.0 (L) 07/16/2023   PLT 175 07/16/2023     Lab Results  Component Value Date   NA 138 07/16/2023   K 4.6 07/16/2023   CL 109 07/16/2023   CO2 23 07/16/2023   BUN 32 (H) 07/16/2023   CREATININE 1.89 (H) 07/16/2023   CALCIUM 9.2 07/16/2023   MG 1.8 02/03/2022   PHOS 3.3 01/23/2023    Lab Results  Component Value Date   INR 1.3 (H) 01/19/2023   APTT 35 01/19/2023     Urine Culture: @[id number] (laburin,org,[id number])@   IMAGING: No results found.  ------  Assessment:  78 y.o. male with PMH significant for Parkinson's, enlarged prostate, and urinary retention requiring chronic urethral catheterization that presents from his facility after his urinary catheter fell out.  Urology was consulted given  difficulty with catheter placement.  Please see procedure note for full details.  Unfortunate, I was unable to easily replace the catheter, even after confirming placement of a sensor wire and ureteral catheter into the bladder.  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  Normally, I would perform flexible cystoscopy to visualize the stricture, and dilate the stricture to accommodate Foley catheter.  However, his urethral erosion is so significant, and given his ongoing urinary catheter needs, would favor a suprapubic tube placement instead.  This would lower his risk of urinary tract infections compared to a chronic indwelling urethral catheter.  Reassuringly, the patient does not have a very high bladder scan currently.  We will contact interventional radiology to perform a nonemergent suprapubic tube.  Patient does not appear to be on any blood thinners, and will need to be n.p.o. appropriate.  I did have extensive discussion with the patient's daughter about her thought process, more this may be better long-term setting.  I also discussed that if the urethral erosion continue, it could impact his external urinary center, and could create long term incontinence.  Recommendations: -Urology recommend suprapubic  catheter placement in the setting of chronic urethral erosion and likely long-term need for indwelling Foley catheter -Please obtain every 8 bladder scan -Recommend obtaining urine analysis and dedicated urine culture  Thank you for this consult. Please contact the urology consult pager with any further questions/concerns.  Lynwood LELON Decamp, MD Resident Physician [facility]

## 2023-07-16 NOTE — ED Triage Notes (Signed)
 BIBA from [facility] after catheter was accidentally removed. Catheter has been out for 24 hours, staff was unable to replace due to it being too painful for pt.

## 2023-07-16 NOTE — H&P (Signed)
 History and Physical  Theodore King FMW:[id number] DOB: 1945/09/22 DOA: 07/16/2023  PCP: [facility]   Chief Complaint: Urinary retention, displacement of Foley catheter  HPI: Theodore King is a 78 y.o. male with medical history significant for dementia, paralysis agitans, Parkinson's disease, HTN, HLD, CKD 3B, urinary retention and BPH s/p chronic indwelling urinary catheter who presents from SNF after his urinary catheter was accidentally removed. Per SNF staff, patient's catheter accidentally fell out yesterday but staff were unable to replace it due to significant penile pain. Patient was sent to the ED today due to concern for urinary retention. Patient endorsed some pain with urination but denies any recent fevers, chills, abdominal pain, nausea, vomiting or hematuria.  Patient does report he has episodes of tremors 2-4 times a day mostly in his lower extremities.  ED Course: Initial vitals showed hypertensive with SBP in the 160s otherwise stable.  Unremarkable CBC and BMP.  Patient's RN and the ED provider attempted to replace Foley catheter but were unsuccessful.  Urology was consulted for evaluation.  They attempted to replace the catheter but was unsuccessful due to significant urethral erosion.  They recommended admission for suprapubic catheter placement by IR. [facility] was consulted for admission  Review of Systems: Please see HPI for pertinent positives and negatives. A complete 10 system review of systems are otherwise negative.  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)    Paralysis agitans 05/18/2013   Parkinson's disease    Pneumonia due to COVID-19 virus 04/08/2019   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   SOB (shortness of breath) 03/23/2018   Unspecified vitamin D  deficiency    Unspecified vitamin D  deficiency    Vertigo, benign paroxysmal 11/05/2016   Vitamin D  deficiency 05/18/2013   Past Surgical History:  Procedure Laterality Date   APPENDECTOMY     COLONOSCOPY WITH PROPOFOL  N/A 10/26/2021   Procedure: COLONOSCOPY WITH PROPOFOL ;  Surgeon: Abran Norleen SAILOR, MD;  Location: [facility];  Service: Gastroenterology;  Laterality: N/A;   POLYPECTOMY  10/26/2021   Procedure: POLYPECTOMY;  Surgeon: Abran Norleen SAILOR, MD;  Location: [facility];  Service: Gastroenterology;;   ROTATOR CUFF REPAIR     right   Social History:  reports that he quit smoking about 49 years ago. His smoking use included cigarettes. He started smoking about 69 years ago. He has a 10.4 pack-year smoking history. He has never used smokeless tobacco. He reports that he does not currently use alcohol. He reports that he does not use drugs.  No Known Allergies  Family History  Problem Relation Age of Onset   Cancer Mother    Cancer Father    Stroke Brother    Cancer Sister    Healthy Daughter    Cancer Sister    Lung cancer Brother    Healthy Daughter    Colon cancer Neg Hx    Esophageal cancer Neg Hx    Rectal cancer Neg Hx    Stomach cancer Neg Hx      Prior to Admission medications   Medication Sig Start Date End Date Taking? Authorizing Provider  acetaminophen  (TYLENOL ) 500 MG tablet Take 1,000 mg by mouth 2 (two) times daily.    [provider]  albuterol  (VENTOLIN  HFA) 108 (90 Base) MCG/ACT inhaler Inhale 2 puffs into the lungs every 6 (six) hours as needed for wheezing.    [provider]  bisacodyl  (DULCOLAX) 10 MG suppository Place 10 mg rectally daily as needed (constipation not relieved by Milk of Magnesia).    [provider]  carbidopa -levodopa  (SINEMET  CR) 50-200 MG tablet Take 1 tablet by mouth at bedtime. 09/24/21   Penumalli, Vikram R, MD  carbidopa -levodopa  (SINEMET  IR) 25-100 MG tablet Take 2 tablets at 7 AM, 2 tablets at 10 AM, 2 tablets at 2 PM and 1 tablet at 6 PM Patient taking differently: Take 2 tablets by mouth 3 (three) times daily. 1000, 1400, 1800 09/24/21   Penumalli, Vikram R, MD  carbidopa -levodopa  (SINEMET  IR) 25-100 MG tablet Take 2 tablets by mouth daily at 12 noon. 0700    [provider]  glycopyrrolate  (ROBINUL ) 1 MG tablet Take 1 tablet (1 mg total) by mouth 2 (two) times daily. 09/24/21   Penumalli, Vikram R, MD  Magnesium  Hydroxide (MILK OF MAGNESIA PO) Take 30 mLs by mouth daily as needed (constipation).    [provider]  nystatin  (MYCOSTATIN /NYSTOP ) powder Apply 1 Application topically 3 (three) times daily as needed (moisture associated dermatitis). Apply to groin area and skin folds.    [provider]  sertraline  (ZOLOFT ) 25 MG tablet Take 1 tablet (25 mg total) by mouth daily. 09/24/21   Penumalli, Vikram R, MD  Sodium Phosphates  (ENEMA RE) Place 1 enema rectally daily as needed (constipation not relieved by bisacodyl  suppository).    [provider]  traZODone  (DESYREL ) 50 MG tablet Take 50 mg by mouth at bedtime.    [provider]  UNABLE TO FIND Apply 1 application  topically daily. Med Name: Lumiderm Advanced Therapy Apply topically to all extremities daily. Apply topically to all extremities four times daily as needed for dry skin/skin integrity.    [provider]  Vitamin D , Ergocalciferol , (DRISDOL) 1.25 MG (50000 UNIT) CAPS capsule Take 50,000 Units by mouth every 30 (thirty) days. Once a month on the 3rd.    [provider]    Physical Exam: BP (!) 163/56   Pulse 60   Temp 97.8 F (36.6 C) (Oral)   Resp 17   SpO2 97%  General: Pleasant, well-appearing elderly man laying in bed. No acute  distress. HEENT: [place]/AT. Anicteric sclera CV: RRR. No murmurs, rubs, or gallops. No LE edema Pulmonary: Lungs CTAB. Normal effort. No wheezing or rales. Abdominal: Soft, nontender, nondistended. Normal bowel sounds. Extremities: Palpable radial and DP pulses. Normal ROM. Skin: Warm and dry. No obvious rash or lesions. Neuro: Alert and oriented to self, person and place but not to time. Brief episode of tremors more pronounced on his BLE during evaluation. Normal sensation to light touch.  Psych: Normal mood and affect          Labs on Admission:  Basic Metabolic Panel: Recent Labs  Lab 07/16/23 1334  NA 138  K 4.6  CL 109  CO2 23  GLUCOSE 146*  BUN 32*  CREATININE 1.89*  CALCIUM 9.2   Liver Function Tests: No results for input(s): AST, ALT, ALKPHOS, BILITOT, PROT, ALBUMIN in the last 168 hours. No results for input(s): LIPASE, AMYLASE in the last 168 hours. No results for input(s): AMMONIA in the last 168 hours. CBC: Recent Labs  Lab 07/16/23 1334  WBC 6.7  NEUTROABS 4.3  HGB 11.3*  HCT 37.0*  MCV 86.7  PLT 175   Cardiac Enzymes: No results for input(s): CKTOTAL, CKMB, CKMBINDEX, TROPONINI in the last 168 hours. BNP (last 3 results) No results for input(s): BNP in the last 8760 hours.  ProBNP (last 3 results) No results for input(s): PROBNP in the last 8760 hours.  CBG: No results for input(s): GLUCAP in the last 168 hours.  Radiological Exams on Admission: No results found. Assessment/Plan Theodore King is a 78 y.o. male with medical history significant for dementia, paralysis agitans, Parkinson's disease, HTN, HLD, CKD 3B, urinary retention and BPH s/p chronic indwelling urinary catheter who presents from SNF after his urinary catheter was accidentally removed and admitted for placement of suprapubic catheter.  # Urinary retention # Displacement of Foley catheter Patient with history of chronic indwelling Foley catheter  due to BPH and Parkinson's disease presenting from her SNF after Foley catheter was accidentally removed 1 day prior to admission. Unsuccessful attempted but ED RN and ED provider to replace Foley. Urology consulted for evaluation.  On assessment, patient found to have urethral erosion due to the chronic Foley. Unsuccessful attempt at placing Foley so recommended placement of suprapubic catheter.  Bladder scan only showing 102 cc of urine. -Urology following, appreciate recs -IR consulted for CT-guided suprapubic catheter placement, appreciate assistance -Bladder scan every 8 hours -Follow-up UA and urine culture  # Parkinson's disease # Dementia Patient alert and oriented to self, person and place but not to time at baseline.  He has intermittent tremors more pronounced in his lower extremities. -Continue on carbidopa -levodopa  -Follow-up vitamin B12 levels -Fall and delirium precautions  # CKD 3B BMP on admission shows improvement in kidney function with creatinine of 1.89, down from 2.45 in August 2024. -Avoid nephrotoxic's agents -Trend renal function  # Insomnia -Continue trazodone  at bedtime  # Generalized weakness -PT/OT eval -Check vitamin D  levels  DVT prophylaxis: Lovenox      Code Status: Full Code  Consults called: Urology, interventional radiology  Family Communication: Discussed admission with daughter on the phone  Severity of Illness: The appropriate patient status for this patient is INPATIENT. Inpatient status is judged to be reasonable and necessary in order to provide the required intensity of service to ensure the patient's safety. The patient's presenting symptoms, physical exam findings, and initial radiographic and laboratory data in the context of their chronic comorbidities is felt to place them at high risk for further clinical deterioration. Furthermore, it is not anticipated that the patient will be medically stable for discharge from the hospital within 2  midnights of admission.   * I certify that at the point of admission it is my clinical judgment that the patient will require inpatient hospital care spanning beyond 2 midnights from the point of admission due to high intensity of service, high risk for further deterioration and high frequency of surveillance required.*  Level of care: Med-Surg   Theodore Claretta HERO, MD 07/16/2023, 5:05 PM [facility] Pager: [phone] Isaiah 41:10   If 7PM-7AM, please contact night-coverage [email or web address] Password [email or web address]

## 2023-07-16 NOTE — ED Provider Notes (Signed)
 [place] EMERGENCY DEPARTMENT AT [facility] Provider Note   CSN: [id number] Arrival date & time: 07/16/23  1320     History  No chief complaint on file.   Theodore King is a 78 y.o. male.  Pt is a 78 yo male with pmhx significant for dementia, Parkinson's disease, hld, hld, and urinary retention.  Pt has an indwelling foley and it fell out yesterday.  Apparently, the staff tried to replace it, but were unsuccessful.  It is unclear why they did not send him yesterday or if he's been able to urinate on his own.  Pt is a very poor historian due to the dementia.         Home Medications Prior to Admission medications   Medication Sig Start Date End Date Taking? Authorizing Provider  acetaminophen  (TYLENOL ) 500 MG tablet Take 1,000 mg by mouth 2 (two) times daily.    [provider]  albuterol  (VENTOLIN  HFA) 108 (90 Base) MCG/ACT inhaler Inhale 2 puffs into the lungs every 6 (six) hours as needed for wheezing.    [provider]  bisacodyl  (DULCOLAX) 10 MG suppository Place 10 mg rectally daily as needed (constipation not relieved by Milk of Magnesia).    [provider]  carbidopa -levodopa  (SINEMET  CR) 50-200 MG tablet Take 1 tablet by mouth at bedtime. 09/24/21   Penumalli, Vikram R, MD  carbidopa -levodopa  (SINEMET  IR) 25-100 MG tablet Take 2 tablets at 7 AM, 2 tablets at 10 AM, 2 tablets at 2 PM and 1 tablet at 6 PM Patient taking differently: Take 2 tablets by mouth 3 (three) times daily. 1000, 1400, 1800 09/24/21   Penumalli, Vikram R, MD  carbidopa -levodopa  (SINEMET  IR) 25-100 MG tablet Take 2 tablets by mouth daily at 12 noon. 0700    [provider]  glycopyrrolate  (ROBINUL ) 1 MG tablet Take 1 tablet (1 mg total) by mouth 2 (two) times daily. 09/24/21   Penumalli, Vikram R, MD  Magnesium  Hydroxide (MILK OF MAGNESIA PO) Take 30 mLs by mouth daily as needed (constipation).    [provider]  nystatin  (MYCOSTATIN /NYSTOP )  powder Apply 1 Application topically 3 (three) times daily as needed (moisture associated dermatitis). Apply to groin area and skin folds.    [provider]  sertraline  (ZOLOFT ) 25 MG tablet Take 1 tablet (25 mg total) by mouth daily. 09/24/21   Penumalli, Vikram R, MD  Sodium Phosphates  (ENEMA RE) Place 1 enema rectally daily as needed (constipation not relieved by bisacodyl  suppository).    [provider]  traZODone  (DESYREL ) 50 MG tablet Take 50 mg by mouth at bedtime.    [provider]  UNABLE TO FIND Apply 1 application  topically daily. Med Name: Lumiderm Advanced Therapy Apply topically to all extremities daily. Apply topically to all extremities four times daily as needed for dry skin/skin integrity.    [provider]  Vitamin D , Ergocalciferol , (DRISDOL) 1.25 MG (50000 UNIT) CAPS capsule Take 50,000 Units by mouth every 30 (thirty) days. Once a month on the 3rd.    [provider]      Allergies    Patient has no known allergies.    Review of Systems   Review of Systems  Genitourinary:  Positive for difficulty urinating.  All other systems reviewed and are negative.   Physical Exam Updated Vital Signs BP (!) 163/56   Pulse 60   Temp 97.8 F (36.6 C) (Oral)   Resp 17   SpO2 97%  Physical Exam  Vitals and nursing note reviewed.  Constitutional:      Appearance: Normal appearance.  HENT:     Head: Normocephalic and atraumatic.     Right Ear: External ear normal.     Left Ear: External ear normal.     Nose: Nose normal.     Mouth/Throat:     Mouth: Mucous membranes are moist.     Pharynx: Oropharynx is clear.  Eyes:     Extraocular Movements: Extraocular movements intact.     Conjunctiva/sclera: Conjunctivae normal.     Pupils: Pupils are equal, round, and reactive to light.  Cardiovascular:     Rate and Rhythm: Normal rate and regular rhythm.     Pulses: Normal pulses.     Heart sounds: Normal heart sounds.   Pulmonary:     Effort: Pulmonary effort is normal.     Breath sounds: Normal breath sounds.  Abdominal:     General: Abdomen is flat. Bowel sounds are normal.     Palpations: Abdomen is soft.     Tenderness: There is abdominal tenderness in the suprapubic area.  Genitourinary:    Comments: Penile fissure from chronic foley cath use.  I am unable to find urethra. Musculoskeletal:     Cervical back: Normal range of motion and neck supple.  Skin:    General: Skin is warm.     Capillary Refill: Capillary refill takes less than 2 seconds.  Neurological:     General: No focal deficit present.     Mental Status: He is alert and oriented to person, place, and time.  Psychiatric:        Mood and Affect: Mood normal.        Behavior: Behavior normal.     ED Results / Procedures / Treatments   Labs (all labs ordered are listed, but only abnormal results are displayed) Labs Reviewed  BASIC METABOLIC PANEL - Abnormal; Notable for the following components:      Result Value   Glucose, Bld 146 (*)    BUN 32 (*)    Creatinine, Ser 1.89 (*)    GFR, Estimated 36 (*)    All other components within normal limits  CBC WITH DIFFERENTIAL/PLATELET - Abnormal; Notable for the following components:   Hemoglobin 11.3 (*)    HCT 37.0 (*)    All other components within normal limits  URINE CULTURE  URINALYSIS, W/ REFLEX TO CULTURE (INFECTION SUSPECTED)    EKG None  Radiology No results found.  Procedures Procedures    Medications Ordered in ED Medications  lidocaine  (XYLOCAINE ) 2 % jelly 1 Application (1 Application Urethral Given 07/16/23 1416)  sodium chloride  0.9 % bolus 500 mL (500 mLs Intravenous New Bag/Given 07/16/23 1350)    ED Course/ Medical Decision Making/ A&P                                 Medical Decision Making Amount and/or Complexity of Data Reviewed Labs: ordered.   This patient presents to the ED for concern of urinary retention, this involves an extensive  number of treatment options, and is a complaint that carries with it a high risk of complications and morbidity.  The differential diagnosis includes retention, infection   Co morbidities that complicate the patient evaluation  dementia, Parkinson's disease, hld, hld, and urinary retention   Additional history obtained:  Additional history obtained from epic chart  External records from outside source obtained  and reviewed including EMS report   Lab Tests:  I Ordered, and personally interpreted labs.  The pertinent results include:  cbc with hgb 11.3 (stable), bmp with bun 32 and cr 1.89 (stable)   Medicines ordered and prescription drug management:  I have reviewed the patients home medicines and have made adjustments as needed  Consultations Obtained:  I requested consultation with the urologist (Dr. Ulis),  and discussed lab and imaging findings as well as pertinent plan - he will see pt   Problem List / ED Course:  Chronic foley catheter use with hx urinary retention:  neither the nurse nor myself were able to pass a foley.  Urology consulted.  I anticipate d/c home if urology is able to pass the foley.   Reevaluation:  After the interventions noted above, I reevaluated the patient and found that they have :stayed the same   Social Determinants of Health:  Lives in facility   Dispostion: Pending at shift change        Final Clinical Impression(s) / ED Diagnoses Final diagnoses:  Chronic indwelling Foley catheter    Rx / DC Orders ED Discharge Orders     None         Dean Clarity, MD 07/16/23 1521

## 2023-07-16 NOTE — ED Provider Notes (Signed)
 Patient initially seen by Dr. Dean.  Please see her note.  Patient reportedly has an indwelling Foley catheter and it fell out yesterday.  Patient has developed urethral erosion from his chronic indwelling Foley catheter.  Patient was evaluated by urology, Dr Fleeta.  He was not able to place a catheter.  Patient is not currently in retention. patient will require a suprapubic catheter as the patient will continue to have severe worsening urethral injury if he continues with a Foley catheter and may also develop bladder outlet obstruction.  He recommends admission to the hospital and IR placement of a suprapubic catheter.  Ok to wait until tomorrow.  I will consult the medical service for admission.  Case discussed with Dr Lou regarding admission   Randol Simmonds, MD 07/16/23 [phone]

## 2023-07-16 NOTE — ED Notes (Signed)
 2 unsuccessful attempts at urinary catheter

## 2023-07-16 NOTE — Procedures (Signed)
 Foley Catheter Placement Note  Indications: 78 y.o. male with urethral erosion 2/2 chronic indwelling foley catheter 2/2 parkinson's disease and enlarged prostate  Pre-operative Diagnosis: Urinary retention  Post-operative Diagnosis: Same  Surgeon: Lynwood Decamp, MD  Assistants: None  Procedure Details  Patient was placed in the supine position, prepped with Betadine and draped in the usual sterile fashion.  I first attempted catheter placement with a regular 16 French there is immediate resistance that was noted at approximately mid to the sensor wire, which appeared to be able to be placed beyond this to the bladder.  I placed a ureteral catheter over the sensor wire into the bladder and removed the sensor wire.  I then confirmed placement of the ureteral catheter into the bladder, and using a 10 cc syrgine, was able to draw back light pink urine.  I then replaced the sensor wire, remove the ureteral catheter, and attempted to place a 16 French council tip catheter over the sensor wire.  Resistance was noted at the same and mid to proximal penile urethra.  After this, I ended the procedure and had a discussion with the patient's daughter over the phone.  He was mutually decided that a suprapubic catheter would be more beneficial in this setting given that he was not currently explored retention, but would require an ongoing catheter given his functional status.  Complications: None; patient tolerated the procedure well.  Plan:   Please see consult note for full plan.    Attending Attestation: Dr. Rosalynn Likens was available.  Lynwood LELON Decamp, MD Resident Physician [facility]

## 2023-07-17 ENCOUNTER — Encounter (HOSPITAL_COMMUNITY): Payer: Self-pay | Admitting: Student

## 2023-07-17 ENCOUNTER — Inpatient Hospital Stay (HOSPITAL_COMMUNITY): Payer: Medicare (Managed Care)

## 2023-07-17 DIAGNOSIS — N1832 Chronic kidney disease, stage 3b: Secondary | ICD-10-CM | POA: Insufficient documentation

## 2023-07-17 DIAGNOSIS — T83021A Displacement of indwelling urethral catheter, initial encounter: Secondary | ICD-10-CM | POA: Diagnosis not present

## 2023-07-17 DIAGNOSIS — D638 Anemia in other chronic diseases classified elsewhere: Secondary | ICD-10-CM | POA: Insufficient documentation

## 2023-07-17 DIAGNOSIS — F0280 Dementia in other diseases classified elsewhere without behavioral disturbance: Secondary | ICD-10-CM

## 2023-07-17 DIAGNOSIS — R338 Other retention of urine: Secondary | ICD-10-CM

## 2023-07-17 DIAGNOSIS — N401 Enlarged prostate with lower urinary tract symptoms: Secondary | ICD-10-CM

## 2023-07-17 DIAGNOSIS — G20A1 Parkinson's disease without dyskinesia, without mention of fluctuations: Secondary | ICD-10-CM

## 2023-07-17 DIAGNOSIS — G20B1 Parkinson's disease with dyskinesia, without mention of fluctuations: Secondary | ICD-10-CM | POA: Diagnosis not present

## 2023-07-17 DIAGNOSIS — I10 Essential (primary) hypertension: Secondary | ICD-10-CM

## 2023-07-17 LAB — BASIC METABOLIC PANEL
Anion gap: 9 (ref 5–15)
BUN: 33 mg/dL — ABNORMAL HIGH (ref 8–23)
CO2: 22 mmol/L (ref 22–32)
Calcium: 9 mg/dL (ref 8.9–10.3)
Chloride: 110 mmol/L (ref 98–111)
Creatinine, Ser: 1.99 mg/dL — ABNORMAL HIGH (ref 0.61–1.24)
GFR, Estimated: 34 mL/min — ABNORMAL LOW (ref >60)
Glucose, Bld: 106 mg/dL — ABNORMAL HIGH (ref 70–99)
Potassium: 4.4 mmol/L (ref 3.5–5.1)
Sodium: 141 mmol/L (ref 135–145)

## 2023-07-17 LAB — CBC
HCT: 32.1 % — ABNORMAL LOW (ref 39.0–52.0)
Hemoglobin: 10.2 g/dL — ABNORMAL LOW (ref 13.0–17.0)
MCH: 27.3 pg (ref 26.0–34.0)
MCHC: 31.8 g/dL (ref 30.0–36.0)
MCV: 85.8 fL (ref 80.0–100.0)
Platelets: 138 10*3/uL — ABNORMAL LOW (ref 150–400)
RBC: 3.74 MIL/uL — ABNORMAL LOW (ref 4.22–5.81)
RDW: 13.8 % (ref 11.5–15.5)
WBC: 10.3 10*3/uL (ref 4.0–10.5)
nRBC: 0 % (ref 0.0–0.2)

## 2023-07-17 LAB — PROTIME-INR
INR: 1.2 (ref 0.8–1.2)
Prothrombin Time: 15 s (ref 11.4–15.2)

## 2023-07-17 LAB — VITAMIN D 25 HYDROXY (VIT D DEFICIENCY, FRACTURES): Vit D, 25-Hydroxy: 38.49 ng/mL (ref 30–100)

## 2023-07-17 LAB — VITAMIN B12: Vitamin B-12: 379 pg/mL (ref 180–914)

## 2023-07-17 MED ORDER — HYDRALAZINE HCL 20 MG/ML IJ SOLN
10.0000 mg | Freq: Four times a day (QID) | INTRAMUSCULAR | Status: DC | PRN
Start: 2023-07-17 — End: 2023-07-18

## 2023-07-17 MED ORDER — CARBIDOPA-LEVODOPA ER 50-200 MG PO TBCR
1.0000 | EXTENDED_RELEASE_TABLET | Freq: Every day | ORAL | Status: DC
  Administered 2023-07-18 – 2023-07-21 (×5): 1 via ORAL
  Filled 2023-07-17 (×5): qty 1

## 2023-07-17 MED ORDER — CARBIDOPA-LEVODOPA ER 50-200 MG PO TBCR
1.0000 | EXTENDED_RELEASE_TABLET | Freq: Every day | ORAL | Status: DC
Start: 2023-07-18 — End: 2023-07-17

## 2023-07-17 MED ORDER — LACTATED RINGERS IV BOLUS
500.0000 mL | Freq: Once | INTRAVENOUS | Status: AC
  Administered 2023-07-17: 500 mL via INTRAVENOUS

## 2023-07-17 MED ORDER — LACTATED RINGERS IV SOLN
INTRAVENOUS | Status: AC
Start: 2023-07-17 — End: 2023-07-18

## 2023-07-17 MED ORDER — AMLODIPINE BESYLATE 10 MG PO TABS
10.0000 mg | ORAL_TABLET | Freq: Every day | ORAL | Status: DC
Start: 2023-07-18 — End: 2023-07-22
  Administered 2023-07-18 – 2023-07-22 (×3): 10 mg via ORAL
  Filled 2023-07-17 (×3): qty 1

## 2023-07-17 NOTE — Assessment & Plan Note (Signed)
·   Please see assessment and plan above °

## 2023-07-17 NOTE — Assessment & Plan Note (Signed)
Longstanding known history of dementia and cognitive deficit Minimizing uncomfortable stimuli Minimizing mood altering and sedating agents Frequent redirection by staff Fall precautions  

## 2023-07-17 NOTE — Consult Note (Signed)
 Chief Complaint: Patient was seen in consultation today for urinary retention  Referring Physician(s): Dr. Cordella Nanny  Supervising Physician: Karalee Beat  Patient Status: [facility] - In-pt  History of Present Illness: Theodore King is a 78 y.o. male with history of Parkinson's dementia, HTD, HLD, urinary retention who is foley catheter dependent. His foley catheter was inadvertently removed as his care facility and he was referred to [facility] ED for evaluation.  Replacement of foley catheter has thus far been unsuccessful due to penile erosion.  Urology recommending suprapubic catheter placement.   Case reviewed by Dr. Karalee who approves the patient for the procedure.   PA to room to assess patient who is sleeping soundly.  He does not participate in evaluation.   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   SOB (shortness of breath) 03/23/2018   Unspecified vitamin D  deficiency    Unspecified vitamin D  deficiency    Vertigo, benign paroxysmal 11/05/2016   Vitamin D  deficiency 05/18/2013    Past Surgical History:  Procedure Laterality Date   APPENDECTOMY     COLONOSCOPY WITH PROPOFOL  N/A 10/26/2021   Procedure: COLONOSCOPY WITH PROPOFOL ;  Surgeon: Abran Norleen SAILOR, MD;  Location: [facility];  Service: Gastroenterology;  Laterality: N/A;   POLYPECTOMY  10/26/2021   Procedure: POLYPECTOMY;  Surgeon: Abran Norleen SAILOR, MD;  Location: [facility];  Service: Gastroenterology;;   ROTATOR CUFF REPAIR     right    Allergies: Patient has no known allergies.  Medications: Prior to Admission medications   Medication Sig Start Date End Date Taking? Authorizing Provider  acetaminophen  (TYLENOL ) 500 MG tablet Take 1,000 mg by mouth 2 (two) times daily.   Yes [provider]  albuterol  (VENTOLIN  HFA) 108 (90 Base) MCG/ACT inhaler Inhale 2 puffs into the lungs every 6 (six) hours as needed for wheezing.   Yes [provider]  bisacodyl  (DULCOLAX) 10 MG suppository Place 10 mg rectally daily as needed (constipation not relieved by Milk of Magnesia).   Yes [provider]  carbidopa -levodopa  (SINEMET  CR) 50-200 MG tablet Take 1 tablet by mouth at bedtime. 09/24/21  Yes Penumalli, Eduard SAUNDERS, MD  carbidopa -levodopa  (SINEMET  IR) 25-100 MG tablet Take 2 tablets at 7 AM, 2 tablets at 10 AM, 2 tablets at 2 PM and 1 tablet at 6 PM Patient taking differently: Take 1-2 tablets by mouth See admin instructions. Take 2 tablets by mouth two times a day and 1 tablet in the evening 09/24/21  Yes Penumalli, Vikram R, MD  Emollient (LUBRIDERM ADVANCED THERAPY) LOTN Apply 1 application  topically See admin instructions. Apply to all extremities every day shift and every 6 hours as needed for dryness   Yes [provider]  glycopyrrolate  (ROBINUL ) 1 MG tablet Take 1 tablet (1 mg total) by mouth 2 (two) times daily. 09/24/21  Yes Penumalli, Vikram R, MD  haloperidol  lactate (HALDOL ) 5 MG/ML injection Inject 5 mg into the muscle See admin instructions. Inject 5  mg into the muscle once when re-inserting Foley catheter; wait 1 hour, then attempt reinsertion of catheter   Yes [provider]  magnesium  hydroxide (GOODSENSE MILK OF MAGNESIA) 400 MG/5ML suspension Take 30 mLs by mouth daily as needed (if no B/M after 3 days).   Yes [provider]  nystatin  (MYCOSTATIN /NYSTOP ) powder Apply 1 Application topically  every 8 (eight) hours as needed (moisture-associated dermatitis). Apply to groin area and skin folds.   Yes [provider]  OVER THE COUNTER MEDICATION Take 120 mLs by mouth See admin instructions. MedPass- Drink 120 ml's by mouth once a day if Ensure is not available   Yes [provider]  Sodium Phosphates  (ENEMA RE) Place 1 enema rectally daily as needed (constipation not relieved by bisacodyl  suppository).   Yes [provider]  traZODone  (DESYREL ) 50 MG tablet Take 50 mg by mouth at bedtime.   Yes [provider]  Vitamin D , Ergocalciferol , (DRISDOL) 1.25 MG (50000 UNIT) CAPS capsule Take 50,000 Units by mouth See admin instructions. Take 50,000 units by mouth on the 3rd of every month   Yes [provider]     Family History  Problem Relation Age of Onset   Cancer Mother    Cancer Father    Stroke Brother    Cancer Sister    Healthy Daughter    Cancer Sister    Lung cancer Brother    Healthy Daughter    Colon cancer Neg Hx    Esophageal cancer Neg Hx    Rectal cancer Neg Hx    Stomach cancer Neg Hx     Social History   Socioeconomic History   Marital status: Single    Spouse name: Not on file   Number of children: 3   Years of education: 11th   Highest education level: 11th grade  Occupational History    Employer: RETIRED  Tobacco Use   Smoking status: Former    Current packs/day: 0.00    Average packs/day: 0.5 packs/day for 20.8 years (10.4 ttl pk-yrs)    Types: Cigarettes    Start date: 08/14/1953    Quit date: 05/23/1974    Years since quitting: 49.1   Smokeless tobacco: Never  Vaping Use   Vaping status: Never Used  Substance and Sexual Activity   Alcohol use: Not Currently   Drug use: No   Sexual activity: Never  Other Topics Concern   Not on file  Social History Narrative   09/24/21 lives with dgtr [name]   Social Drivers of Health   Financial Resource Strain: Not on file  Food Insecurity: No Food Insecurity  (07/17/2023)   Hunger Vital Sign    Worried About Running Out of Food in the Last Year: Never true    Ran Out of Food in the Last Year: Never true  Transportation Needs: No Transportation Needs (07/17/2023)   PRAPARE - Administrator, Civil Service (Medical): No    Lack of Transportation (Non-Medical): No  Physical Activity: Not on file  Stress: Not on file  Social Connections: Not on file     Review of Systems: A 12 point ROS discussed and pertinent positives are indicated in the HPI above.  All other systems are negative.  Review of Systems  Unable to perform ROS: Dementia    Vital Signs: BP (!) 114/57 (BP Location: Right Arm)   Pulse 71   Temp 99.1 F (37.3 C) (Oral)   Resp 18   SpO2 100%  Physical Exam Vitals and nursing note reviewed.  Constitutional:      General: He is not in acute distress.    Appearance: Normal appearance. He is not ill-appearing.  Cardiovascular:     Rate and Rhythm: Normal rate and regular rhythm.  Pulmonary:     Effort: Pulmonary effort is normal.     Breath sounds: Normal breath sounds.  Abdominal:     General: Abdomen is flat. There is no distension.     Palpations: Abdomen is soft.  Skin:    General: Skin is warm and dry.  Neurological:     Mental Status: He is alert. Mental status is at baseline.  Psychiatric:     Comments: dementia      MD Evaluation Airway: WNL Heart: WNL Abdomen: WNL Chest/ Lungs: WNL ASA  Classification: 3 Mallampati/Airway Score: Two   Imaging: No results found.  Labs:  CBC: Recent Labs    01/19/23 0016 01/19/23 0948 07/16/23 1334 07/17/23 0538  WBC 12.2* 12.4* 6.7 10.3  HGB 9.6* 10.8* 11.3* 10.2*  HCT 29.2* 32.9* 37.0* 32.1*  PLT 119* 129* 175 138*    COAGS: Recent Labs    08/05/22 2041 09/04/22 1539 01/19/23 0948 07/17/23 0812  INR 1.3* 1.2 1.3* 1.2  APTT 44* 41* 35  --     BMP: Recent Labs    01/24/23 0912 01/25/23 0329 07/16/23 1334 07/17/23 0538  NA 142  141 138 141  K 4.1 3.8 4.6 4.4  CL 111 107 109 110  CO2 22 25 23 22   GLUCOSE 97 114* 146* 106*  BUN 43* 38* 32* 33*  CALCIUM 8.3* 8.4* 9.2 9.0  CREATININE 2.83* 2.45* 1.89* 1.99*  GFRNONAA 22* 26* 36* 34*    LIVER FUNCTION TESTS: Recent Labs    08/05/22 2041 08/07/22 0302 09/04/22 1539 01/18/23 1940 01/20/23 0953 01/21/23 0529 01/22/23 0619 01/23/23 0622  BILITOT 1.4* 0.9 0.5 1.7*  --   --   --   --   AST 30 32 25 13*  --   --   --   --   ALT 7 11 7 5   --   --   --   --   ALKPHOS 60 64 80 84  --   --   --   --   PROT 7.4 6.5 6.9 6.8  --   --   --   --   ALBUMIN 3.2* 2.8* 3.0* 3.0* 2.3* 2.3* 2.2* 2.2*    TUMOR MARKERS: No results for input(s): AFPTM, CEA, CA199, CHROMGRNA in the last 8760 hours.  Assessment and Plan: Urinary retention Patient foley catheter dependent at baseline.  Foley was inadvertently removed at care facility. Unsuccessful attempts at bedside replacement by EDP and Urology due to patient's difficult anatomy.   IR consulted for SPT placement.  Case reviewed by Dr. Karalee who approves patient for the procedure.   Patient has very small episodes of urinary leakage making bladder filling difficult.  Bladder scan this AM shows 200 mL.  Team encouraging fluid/boluses to facilitate bladder filling.   Called to discuss with family however both daughter and sister unavailable in room or by phone.   Anticipate procedure in IR tomorrow schedule permitting and once consent obtained.    Thank you for this interesting consult.  I greatly enjoyed meeting Theodore King and look forward to participating in their care.  A copy of this report was sent to the requesting provider on this date.  Electronically Signed: Cheyne Boulden Sue-Ellen Nakenya Theall,  PA 07/17/2023, 12:56 PM   I spent a total of 40 Minutes    in face to face in clinical consultation, greater than 50% of which was counseling/coordinating care for urinary retention.

## 2023-07-17 NOTE — Assessment & Plan Note (Signed)
 Creatinine is apparently near baseline Monitoring renal function and electrolytes with serial chemistries

## 2023-07-17 NOTE — Assessment & Plan Note (Signed)
 Displacement of urinary catheter while at skilled nursing facility Unfortunately, both nursing and urology unable to place urethral catheter upon arrival to [facility] Due to patient's severe dehydration, bladder was not distended enough to perform suprapubic catheter placement today. Per my discussions with both IR and urology, hydrating patient gently with intravenous fluids with plans to undergo suprapubic catheter tomorrow morning with IR Patient still able to pass some urine reducing risk of severe pain or bladder rupture.

## 2023-07-17 NOTE — Plan of Care (Signed)
  Problem: Clinical Measurements: Goal: Ability to maintain clinical measurements within normal limits will improve 07/17/2023 1957 by Raynaldo Vina GAILS, RN Outcome: Progressing 07/17/2023 1954 by Raynaldo Vina GAILS, RN Outcome: Progressing Goal: Will remain free from infection 07/17/2023 1957 by Raynaldo Vina GAILS, RN Outcome: Progressing 07/17/2023 1954 by Raynaldo Vina GAILS, RN Outcome: Progressing Goal: Respiratory complications will improve Outcome: Progressing Goal: Cardiovascular complication will be avoided 07/17/2023 1957 by Raynaldo Vina GAILS, RN Outcome: Progressing 07/17/2023 1954 by Raynaldo Vina GAILS, RN Outcome: Progressing   Problem: Coping: Goal: Level of anxiety will decrease 07/17/2023 1957 by Raynaldo Vina GAILS, RN Outcome: Progressing 07/17/2023 1954 by Raynaldo Vina GAILS, RN Outcome: Progressing   Problem: Elimination: Goal: Will not experience complications related to urinary retention Outcome: Progressing   Problem: Skin Integrity: Goal: Risk for impaired skin integrity will decrease Outcome: Progressing

## 2023-07-17 NOTE — Hospital Course (Addendum)
 Brief Narrative:  78 y.o. male with medical history significant for dementia, Parkinson's disease, HTN, HLD, CKD 3B, urinary retention and BPH s/p chronic indwelling urinary catheter who present to [facility] ED  from SNF after his urinary catheter was accidentally removed.    Catheter accidentally removed at SNF.  Difficult to place Foley by urology due to stricture.  Eventually CT-guided supra catheter placed 2/10, 53F.  Follow up outpatient with IR and urology.   Hemoglobin around baseline of 10, slowly dropped down to 7.3.  1 unit PRBC transfusion 2/12, will give another unit today.  Hb is now stable around 8, without any signs of bleeding.   Medically stable for dc today.   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  Difficult to place Foley by urology due to stricture.  Eventually CT-guided supra catheter placed 2/10, 53F.  Follow up outpatient with IR and urology.   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 Please see assessment and plan above  AKI on chronic kidney disease, stage 3b (HCC) Creatinine around baseline of 1.8, creatinine peaked at 2.45.  Gentle hydration.  Anemia of chronic disease Hemoglobin around baseline of 10, slowly dropped down to 7.3.  1 unit PRBC transfusion 2/12, will give another unit today.  Hb is now stable around 8, without any signs of bleeding.    Essential hypertension Continue Norvasc .  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  Continue Sinemet   DVT prophylaxis: Lovenox     Code Status: Full Code Family Communication:  Called [name].  Status is: Inpatient Remains inpatient appropriate because:  Discharge back to long-term care facility hopefully tomorrow   Subjective: Feeling okay no complaints. No signs of bleeding.  Examination:  General exam: Appears calm and comfortable  Respiratory system: Clear to auscultation. Respiratory effort normal. Cardiovascular system: S1 & S2 heard, RRR. No JVD, murmurs, rubs, gallops or clicks. No pedal edema. Gastrointestinal system: Abdomen is nondistended, soft and nontender. No organomegaly or masses felt. Normal bowel sounds heard. Central nervous system: Alert and oriented. No focal neurological deficits. Extremities: Symmetric 5 x 5 power. Skin: No rashes, lesions or ulcers Psychiatry: Judgement and insight appear poor Super pubic catheter in place

## 2023-07-17 NOTE — Progress Notes (Signed)
 PT Cancellation Note  Patient Details Name: Theodore King MRN: [id number] DOB: 1945/10/22   Cancelled Treatment:    Reason Eval/Treat Not Completed: PT screened, no needs identified, will sign off  Pt is a long term SNF resident.  Will defer needs to facility.  Acute PT to sign off.   Tari CROME Payson 07/17/2023, 9:12 AM Tari KLEIN, DPT Physical Therapist Acute Rehabilitation Services Office: [phone]

## 2023-07-17 NOTE — Plan of Care (Signed)
   Problem: Coping: Goal: Level of anxiety will decrease Outcome: Progressing   Problem: Pain Managment: Goal: General experience of comfort will improve and/or be controlled Outcome: Progressing

## 2023-07-17 NOTE — Assessment & Plan Note (Signed)
 Resume home regimen of Sinemet 

## 2023-07-17 NOTE — Progress Notes (Signed)
 PROGRESS NOTE   Theodore King  FMW:[id number] DOB: Feb 08, 1946 DOA: 07/16/2023 PCP: [facility]   Date of Service: the patient was seen and examined on 07/17/2023  Brief Narrative:  78 y.o. male with medical history significant for dementia, Parkinson's disease, HTN, HLD, CKD 3B, urinary retention and BPH s/p chronic indwelling urinary catheter who present to [facility] hospital ED  from SNF after his urinary catheter was accidentally removed.    There has been significant difficulty with inserting a urethral catheter and therefore upon conversing with urology and interventional radiology plan is for patient to undergo suprapubic catheter placement on 2/10.     Assessment & Plan Displacement of Foley catheter, initial encounter (HCC) Displacement of urinary catheter while at skilled nursing facility Unfortunately, both nursing and urology unable to place urethral catheter upon arrival to [facility] Due to patient's severe dehydration, bladder was not distended enough to perform suprapubic catheter placement today. Per my discussions with both IR and urology, hydrating patient gently with intravenous fluids with plans to undergo suprapubic catheter tomorrow morning with IR Patient still able to pass some urine reducing risk of severe pain or bladder rupture. 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 Please see assessment and plan above Chronic kidney disease, stage 3b (HCC) Creatinine is apparently near baseline Monitoring renal function and electrolytes with serial chemistries Anemia of chronic disease Notable substantial anemia No clinical evidence of bleeding Monitoring hemoglobin and hematocrit with serial CBCs  Essential hypertension Markedly uncontrolled blood pressures Placing patient on amlodipine  10 mg daily As needed intravenous hydralazine  for markedly elevated blood pressure Dementia due to Parkinson disease without  behavioral disturbance (HCC) Longstanding known history of dementia and cognitive deficit Minimizing uncomfortable stimuli Minimizing mood altering and sedating agents Frequent redirection by staff Fall precautions Parkinson's disease (HCC) Resume home regimen of Sinemet     Subjective:  Patient unable to answer questions appropriately due to dementia.  Patient currently denying pain.  Physical Exam:  Vitals:   07/17/23 0533 07/17/23 1409 07/17/23 1410 07/17/23 2140  BP: (!) 114/57 (!) 190/48  (!) 168/62  Pulse: 71 61  62  Resp: 18 18  17   Temp: 99.1 F (37.3 C)  98.6 F (37 C) 98.7 F (37.1 C)  TempSrc: Oral  Oral Oral  SpO2: 100% 100%  100%    Constitutional: lethargic and arousable, oriented x 1,  no associated distress.   Skin: no rashes, no lesions, poor skin turgor noted. Eyes: Pupils are equally reactive to light.  No evidence of scleral icterus or conjunctival pallor.  ENMT: dry mucous membranes noted.  Posterior pharynx clear of any exudate or lesions.   Respiratory: clear to auscultation bilaterally, no wheezing, no crackles. Normal respiratory effort. No accessory muscle use.  Cardiovascular: Regular rate and rhythm, no murmurs / rubs / gallops. No extremity edema. 2+ pedal pulses. No carotid bruits.  Abdomen: some lower abdominal tenderness noted.   No evidence of intra-abdominal masses.  Positive bowel sounds noted in all quadrants.   Musculoskeletal: No joint deformity upper and lower extremities. Good ROM, no contractures. Normal muscle tone.    Data Reviewed:  I have personally reviewed and interpreted labs, imaging.  Significant findings are   CBC: Recent Labs  Lab 07/16/23 1334 07/17/23 0538  WBC 6.7 10.3  NEUTROABS 4.3  --   HGB 11.3* 10.2*  HCT 37.0* 32.1*  MCV 86.7 85.8  PLT 175 138*   Basic Metabolic Panel: Recent Labs  Lab  07/16/23 1334 07/17/23 0538  NA 138 141  K 4.6 4.4  CL 109 110  CO2 23 22  GLUCOSE 146* 106*  BUN 32* 33*   CREATININE 1.89* 1.99*  CALCIUM 9.2 9.0   GFR: CrCl cannot be calculated (Unknown ideal weight.). Liver Function Tests: No results for input(s): AST, ALT, ALKPHOS, BILITOT, PROT, ALBUMIN in the last 168 hours.  Coagulation Profile: Recent Labs  Lab 07/17/23 0812  INR 1.2   Code Status:  Full code.   Family Communication:  Daughter updated on plan of care via phone evening of 2/8.    Severity of Illness:  The appropriate patient status for this patient is INPATIENT. Inpatient status is judged to be reasonable and necessary in order to provide the required intensity of service to ensure the patient's safety. The patient's presenting symptoms, physical exam findings, and initial radiographic and laboratory data in the context of their chronic comorbidities is felt to place them at high risk for further clinical deterioration. Furthermore, it is not anticipated that the patient will be medically stable for discharge from the hospital within 2 midnights of admission.   * I certify that at the point of admission it is my clinical judgment that the patient will require inpatient hospital care spanning beyond 2 midnights from the point of admission due to high intensity of service, high risk for further deterioration and high frequency of surveillance required.*  Time spent:  51 minutes  Author:  Zachary JINNY Ba MD  07/17/2023 11:00 PM

## 2023-07-17 NOTE — Plan of Care (Signed)

## 2023-07-17 NOTE — Assessment & Plan Note (Signed)
   Notable substantial anemia  No clinical evidence of bleeding  Monitoring hemoglobin and hematocrit with serial CBCs  

## 2023-07-17 NOTE — Assessment & Plan Note (Signed)
 Markedly uncontrolled blood pressures Placing patient on amlodipine  10 mg daily As needed intravenous hydralazine  for markedly elevated blood pressure

## 2023-07-17 NOTE — Consult Note (Signed)
 Urology Consult Note   Requesting Attending Physician:  Kenard Zachary PARAS, MD Service Providing Consult: Urology  Consulting Attending: Dr. Rosalynn Likens  Reason for Consult:  Urinary retention   HPI: Theodore King is seen in consultation for reasons noted above at the request of Shalhoub, Zachary PARAS, MD for evaluation of urethral origin in the setting of chronic urinary retention  This is a 78 y.o. male with past medical history significant for Parkinson disease.  He also has an enlarged prostate which was seen on imaging back 2023.  [facility] given his ongoing urinary retention.  He has been managed with an 16 French catheter for the past year.  Unfortunately, he has developed significant urethral erosion.  At his living facility, his catheter was noted to have fallen out.  It appears that it was most recently exchanged in January 2025.  In the emergency department, the ED team and nursing staff was unable to place Foley catheter, and urology was called to assist.  Note, his bladder scan in the ED was 102.  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   SOB (shortness of breath) 03/23/2018   Unspecified vitamin D  deficiency    Unspecified vitamin D  deficiency    Vertigo, benign paroxysmal 11/05/2016   Vitamin D  deficiency 05/18/2013    Past Surgical History:  Past Surgical History:  Procedure Laterality  Date   APPENDECTOMY     COLONOSCOPY WITH PROPOFOL  N/A 10/26/2021   Procedure: COLONOSCOPY WITH PROPOFOL ;  Surgeon: Abran Norleen SAILOR, MD;  Location: [facility];  Service: Gastroenterology;  Laterality: N/A;   POLYPECTOMY  10/26/2021   Procedure: POLYPECTOMY;  Surgeon: Abran Norleen SAILOR, MD;  Location: [facility];  Service: Gastroenterology;;   MARCELINE CUFF REPAIR     right    Medication: Current Facility-Administered Medications  Medication Dose Route Frequency Provider Last Rate Last Admin   acetaminophen  (TYLENOL ) tablet 650 mg  650 mg Oral Q6H PRN Amponsah, Prosper M, MD   650 mg at 07/16/23 2052   Or   acetaminophen  (TYLENOL ) suppository 650 mg  650 mg Rectal Q6H PRN Amponsah, Prosper M, MD       albuterol  (PROVENTIL ) (2.5 MG/3ML) 0.083% nebulizer solution 2.5 mg  2.5 mg Nebulization Q6H PRN Amponsah, Prosper M, MD       carbidopa -levodopa  (SINEMET  IR) 25-100 MG per tablet immediate release 1 tablet  1 tablet Oral Q2000 Amponsah, Claretta HERO, MD       carbidopa -levodopa  (SINEMET  IR) 25-100 MG per tablet immediate release 2 tablet  2 tablet Oral BID Lou Claretta HERO, MD   2 tablet at 07/17/23 0816   enoxaparin  (LOVENOX ) injection 40 mg  40 mg Subcutaneous QHS Amponsah, Prosper M, MD   40 mg at 07/16/23 2050   lactated ringers  infusion   Intravenous Continuous Kenard Zachary PARAS, MD 125 mL/hr at 07/17/23 1453 Rate Change at 07/17/23 1453   ondansetron  (ZOFRAN ) tablet 4 mg  4 mg Oral Q6H PRN Amponsah, Prosper M, MD       Or   ondansetron  (ZOFRAN ) injection 4 mg  4 mg Intravenous Q6H PRN Amponsah, Prosper M, MD       oxyCODONE  (Oxy IR/ROXICODONE ) immediate release tablet 5 mg  5 mg Oral Q6H PRN Lou Claretta HERO, MD       senna-docusate (Senokot-S) tablet 1 tablet  1 tablet Oral QHS Lou Claretta HERO, MD   1 tablet at 07/16/23 2048   traZODone  (DESYREL ) tablet 50 mg  50 mg Oral QHS Amponsah, Prosper M, MD   50 mg at 07/16/23 2048    Allergies: No Known Allergies  Social  History: Social History   Tobacco Use   Smoking status: Former    Current packs/day: 0.00    Average packs/day: 0.5 packs/day for 20.8 years (10.4 ttl pk-yrs)    Types: Cigarettes    Start date: 08/14/1953    Quit date: 05/23/1974    Years since quitting: 49.1   Smokeless tobacco: Never  Vaping Use   Vaping status: Never Used  Substance Use Topics   Alcohol use: Not Currently   Drug use: No    Family History Family History  Problem Relation Age of Onset   Cancer Mother    Cancer Father    Stroke Brother    Cancer Sister    Healthy Daughter    Cancer Sister    Lung cancer Brother    Healthy Daughter    Colon cancer Neg Hx    Esophageal cancer Neg Hx    Rectal cancer Neg Hx    Stomach cancer Neg Hx     Review of Systems 10 systems were reviewed and are negative except as noted specifically in the HPI.  Objective   Vital signs in last 24 hours: BP (!) 190/48 (BP Location: Right Arm)   Pulse 61   Temp 98.6 F (37 C) (Oral)   Resp 18   SpO2 100%   Physical Exam General: NAD,  resting. HEENT: [place]/AT, EOMI, MMM Pulmonary: Normal work of breathing Cardiovascular: HDS, adequate peripheral perfusion Abdomen: Soft, NTTP, nondistended. 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, No CVA tenderness Extremities: warm and well perfused Neuro: Appropriate, no focal neurological deficits  Most Recent Labs: Lab Results  Component Value Date   WBC 10.3 07/17/2023   HGB 10.2 (L) 07/17/2023   HCT 32.1 (L) 07/17/2023   PLT 138 (L) 07/17/2023    Lab Results  Component Value Date   NA 141 07/17/2023   K 4.4 07/17/2023   CL 110 07/17/2023   CO2 22 07/17/2023   BUN 33 (H) 07/17/2023   CREATININE 1.99 (H) 07/17/2023   CALCIUM 9.0 07/17/2023   MG 1.8 02/03/2022   PHOS 3.3 01/23/2023    Lab Results  Component Value Date   INR 1.2 07/17/2023   APTT 35 01/19/2023   IMAGING: No results found.  ------  Assessment:  78 y.o. male with PMH  significant for Parkinson's, enlarged prostate, and urinary retention requiring chronic urethral catheterization that presents from his facility after his urinary catheter fell out.  Urology was consulted given difficulty with catheter placement.  Bedside evaluation yielded difficulty with catheterization, likely 2/2 urethra stricture. Upon speaking with family, opting for SPT given significant urethral erosion and need for long term catheterization given pt functional status.    Update 07/17/2023:  Pt with no acute complaints. Lying comfortably in room, mildly confused, appears to be at baseline.  IR unable to place SPT given Bladder Scans of ~200, not large enough to provide safe window. Primary team doing gentle fluid resuscitation to help increase bladder scans, will attempt SPT placement tomorrow.   Recommendations: -Urology recommend suprapubic catheter placement in the setting of chronic urethral erosion and likely long-term need for indwelling Foley catheter -Please ensure pt NPO @ [facility] on 07/18/2023 -If IR still unable to place SPT tomorrow, can consider alternatives for distending bladder, including placement of pediatric sized catheter, urethral dilation, etc  Thank you for this consult. Please contact the urology consult pager with any further questions/concerns.  Lynwood LELON Decamp, MD Resident Physician [facility]

## 2023-07-17 NOTE — Plan of Care (Signed)
  Problem: Clinical Measurements: Goal: Ability to maintain clinical measurements within normal limits will improve Outcome: Progressing Goal: Will remain free from infection Outcome: Progressing Goal: Cardiovascular complication will be avoided Outcome: Progressing   Problem: Coping: Goal: Level of anxiety will decrease Outcome: Progressing   Problem: Elimination: Goal: Will not experience complications related to urinary retention Outcome: Progressing   Problem: Skin Integrity: Goal: Risk for impaired skin integrity will decrease Outcome: Progressing

## 2023-07-18 ENCOUNTER — Inpatient Hospital Stay (HOSPITAL_COMMUNITY): Payer: Medicare (Managed Care)

## 2023-07-18 DIAGNOSIS — T83021A Displacement of indwelling urethral catheter, initial encounter: Secondary | ICD-10-CM | POA: Diagnosis not present

## 2023-07-18 LAB — CBC WITH DIFFERENTIAL/PLATELET
Abs Immature Granulocytes: 0.03 10*3/uL (ref 0.00–0.07)
Basophils Absolute: 0 10*3/uL (ref 0.0–0.1)
Basophils Relative: 0 %
Eosinophils Absolute: 0.1 10*3/uL (ref 0.0–0.5)
Eosinophils Relative: 1 %
HCT: 33.5 % — ABNORMAL LOW (ref 39.0–52.0)
Hemoglobin: 10.4 g/dL — ABNORMAL LOW (ref 13.0–17.0)
Immature Granulocytes: 1 %
Lymphocytes Relative: 23 %
Lymphs Abs: 1.3 10*3/uL (ref 0.7–4.0)
MCH: 27 pg (ref 26.0–34.0)
MCHC: 31 g/dL (ref 30.0–36.0)
MCV: 87 fL (ref 80.0–100.0)
Monocytes Absolute: 0.6 10*3/uL (ref 0.1–1.0)
Monocytes Relative: 11 %
Neutro Abs: 3.7 10*3/uL (ref 1.7–7.7)
Neutrophils Relative %: 64 %
Platelets: 142 10*3/uL — ABNORMAL LOW (ref 150–400)
RBC: 3.85 MIL/uL — ABNORMAL LOW (ref 4.22–5.81)
RDW: 13.6 % (ref 11.5–15.5)
WBC: 5.7 10*3/uL (ref 4.0–10.5)
nRBC: 0 % (ref 0.0–0.2)

## 2023-07-18 LAB — COMPREHENSIVE METABOLIC PANEL
ALT: <5 U/L (ref 0–44)
AST: 22 U/L (ref 15–41)
Albumin: 3.1 g/dL — ABNORMAL LOW (ref 3.5–5.0)
Alkaline Phosphatase: 63 U/L (ref 38–126)
Anion gap: 9 (ref 5–15)
BUN: 33 mg/dL — ABNORMAL HIGH (ref 8–23)
CO2: 21 mmol/L — ABNORMAL LOW (ref 22–32)
Calcium: 8.9 mg/dL (ref 8.9–10.3)
Chloride: 108 mmol/L (ref 98–111)
Creatinine, Ser: 1.84 mg/dL — ABNORMAL HIGH (ref 0.61–1.24)
GFR, Estimated: 37 mL/min — ABNORMAL LOW (ref >60)
Glucose, Bld: 79 mg/dL (ref 70–99)
Potassium: 4.6 mmol/L (ref 3.5–5.1)
Sodium: 138 mmol/L (ref 135–145)
Total Bilirubin: 2.2 mg/dL — ABNORMAL HIGH (ref 0.0–1.2)
Total Protein: 7.1 g/dL (ref 6.5–8.1)

## 2023-07-18 LAB — MAGNESIUM: Magnesium: 1.9 mg/dL (ref 1.7–2.4)

## 2023-07-18 MED ORDER — FENTANYL CITRATE (PF) 100 MCG/2ML IJ SOLN
INTRAMUSCULAR | Status: AC | PRN
  Administered 2023-07-18: 50 ug via INTRAVENOUS

## 2023-07-18 MED ORDER — FENTANYL CITRATE (PF) 100 MCG/2ML IJ SOLN
INTRAMUSCULAR | Status: AC
  Filled 2023-07-18: qty 2

## 2023-07-18 MED ORDER — MIDAZOLAM HCL 2 MG/2ML IJ SOLN
INTRAMUSCULAR | Status: AC
Start: 2023-07-18 — End: ?
  Filled 2023-07-18: qty 2

## 2023-07-18 MED ORDER — METOPROLOL TARTRATE 5 MG/5ML IV SOLN: 5.0000 mg | INTRAVENOUS | Status: DC | PRN

## 2023-07-18 MED ORDER — HYDRALAZINE HCL 20 MG/ML IJ SOLN: 10.0000 mg | INTRAMUSCULAR | Status: DC | PRN

## 2023-07-18 MED ORDER — IPRATROPIUM-ALBUTEROL 0.5-2.5 (3) MG/3ML IN SOLN: 3.0000 mL | RESPIRATORY_TRACT | Status: DC | PRN

## 2023-07-18 MED ORDER — GUAIFENESIN 100 MG/5ML PO LIQD: 5.0000 mL | ORAL | Status: DC | PRN

## 2023-07-18 MED ORDER — SODIUM CHLORIDE 0.9 % IV SOLN
INTRAVENOUS | Status: AC
Start: 2023-07-18 — End: ?
  Filled 2023-07-18: qty 500

## 2023-07-18 MED ORDER — MIDAZOLAM HCL 2 MG/2ML IJ SOLN
INTRAMUSCULAR | Status: AC | PRN
  Administered 2023-07-18: 1 mg via INTRAVENOUS

## 2023-07-18 MED ORDER — MIDAZOLAM HCL 2 MG/2ML IJ SOLN
INTRAMUSCULAR | Status: AC
  Filled 2023-07-18: qty 2

## 2023-07-18 NOTE — Procedures (Signed)
  Procedure:  CT suprapubic bladder drain catheter placement 35f Preprocedure diagnosis: The encounter diagnosis was Chronic indwelling Foley catheter. Postprocedure diagnosis: same EBL:    minimal Complications:   none immediate  See full dictation in [employer].  Nicky Barrack MD Main # [phone] Pager  [phone] Mobile [phone]

## 2023-07-18 NOTE — Progress Notes (Signed)
 MEDICATION-RELATED CONSULT NOTE   IR Procedure Consult - Anticoagulant/Antiplatelet PTA/Inpatient Med List Review by Pharmacist    Procedure: CT suprapubic bladder drain catheter placement    Completed: 2/10 ~1600  Post-Procedural bleeding risk per IR MD assessment:  low  Antithrombotic medications on inpatient or PTA profile prior to procedure:   Lovenox  40mg  qhs    Recommended restart time per IR Post-Procedure Guidelines:  Day 0 (at least 4 hours or at next standard dose interval)   Other considerations:      Plan:    Continue Lovenox  to begin at 2200 this PM as already scheduled   Theodore King, PharmD, BCPS Secure Chat if ?s 07/18/2023 4:36 PM

## 2023-07-18 NOTE — Progress Notes (Signed)
     Subjective: Pt was sleeping on rounds. I did not wake him. Nursing reports 6:00am bladder scan of  Objective: Vital signs in last 24 hours: Temp:  [98.4 F (36.9 C)-98.7 F (37.1 C)] 98.4 F (36.9 C) (02/10 0422) Pulse Rate:  [55-62] 55 (02/10 0422) Resp:  [17-18] 18 (02/10 0422) BP: (164-190)/(48-74) 164/74 (02/10 0422) SpO2:  [100 %] 100 % (02/10 0422)  Assessment/Plan: #enlarged prostate #urinary retention #chronic indwelling foley catheter #urethral erosion  Foley catheter was unable to be placed by nursing and Urology. Urethral stricture.  Considering his urethral erosion and reported need for chronic bladder decompression, he would be best served by a SPT. Unfortunately IR was unable to place due to low bladder capacity. Only was recorded on bladder scan. Scan this morning was even lower at . His ED scan was . At this point he does not appear to be retaining enough to require urgent decompression. Scr is 1.84, unsure of baseline. He is scheduled with Dr. Clarke Crouch this Thurs, 9/13. Reassessment of foley indication can be made then.  Intake/Output from previous day: 02/09 0701 - 02/10 0700 In: 1884.6 [P.O.:120; I.V.:1764.6] Out: 200 [Urine:200]  Intake/Output this shift: No intake/output data recorded.  Physical Exam:  General: Sleep CV: No cyanosis Lungs: equal chest rise   Lab Results: Recent Labs    07/16/23 1334 07/17/23 0538 07/18/23 0445  HGB 11.3* 10.2* 10.4*  HCT 37.0* 32.1* 33.5*   BMET Recent Labs    07/17/23 0538 07/18/23 0445  NA 141 138  K 4.4 4.6  CL 110 108  CO2 22 21*  GLUCOSE 106* 79  BUN 33* 33*  CREATININE 1.99* 1.84*  CALCIUM 9.0 8.9     Studies/Results: No results found.    LOS: 2 days   Alla Ar, NP [facility] Pager: [phone]  07/18/2023, 9:25 AM

## 2023-07-18 NOTE — Progress Notes (Signed)
 [facility] Child psychotherapist contacted RN. Contact number below:   [phone]  SW Leary Provencal MD Dr. Hansel Ley

## 2023-07-18 NOTE — Progress Notes (Addendum)
 Suprapubic catheter site noted bleeding. Dsg soaked with blood. Dsg changed x2 already. Pressure dsg placed, Ice pack placed.Notified on call Joycie Noble. He stated to notify IR about it. Paged on call IR Dr Marlena Sima awaiting call back. At [phone] Secure chat Dr Marlena Sima stated will round pt in AM.

## 2023-07-18 NOTE — Progress Notes (Signed)
 OT Cancellation Note  Patient Details Name: Theodore King MRN: [id number] DOB: 1946-02-06   Cancelled Treatment:    Reason Eval/Treat Not Completed: OT screened, no needs identified, will sign off Patient is a long term care resident at SNF. Patient to transition back to SNF at time of d/c from hospital. OT to sign off.  Wynette Heckler, MS Acute Rehabilitation Department Office# [phone]  07/18/2023, 10:24 AM

## 2023-07-18 NOTE — Sedation Documentation (Signed)
 Case cancelled per md due to nondistended bladder.

## 2023-07-18 NOTE — Progress Notes (Signed)
 PROGRESS NOTE    Theodore King  ZOX:[id number] DOB: [birth year] DOA: 07/16/2023 PCP: [facility]    Brief Narrative:  78 y.o. male with medical history significant for dementia, Parkinson's disease, HTN, HLD, CKD 3B, urinary retention and BPH s/p chronic indwelling urinary catheter who present to [facility] hospital ED  from SNF after his urinary catheter was accidentally removed.     There has been significant difficulty with inserting a urethral catheter and therefore upon conversing with urology and interventional radiology plan is for patient to undergo suprapubic catheter placement on 2/10.      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  Seen by urology, IR eventually planning on probably suprapubic catheter.  Bladder currently not significantly distended for  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 Please see assessment and plan above  Chronic kidney disease, stage 3b (HCC) Creatinine around baseline of 1.8  Anemia of chronic disease Hemoglobin around baseline of 10   Essential hypertension Continue Norvasc .  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  Continue Sinemet   DVT prophylaxis: Lovenox     Code Status: Full Code Family Communication:   Status is: Inpatient Remains inpatient appropriate because:     Subjective:  Seen at bedside, no complaints  Examination:  General exam: Appears calm and  comfortable  Respiratory system: Clear to auscultation. Respiratory effort normal. Cardiovascular system: S1 & S2 heard, RRR. No JVD, murmurs, rubs, gallops or clicks. No pedal edema. Gastrointestinal system: Abdomen is nondistended, soft and nontender. No organomegaly or masses felt. Normal bowel sounds heard. Central nervous system: Alert and oriented. No focal neurological deficits. Extremities: Symmetric 5 x 5 power. Skin: No rashes, lesions or ulcers Psychiatry: Judgement and insight appear poor               Diet Orders (From admission, onward)     Start     Ordered   07/18/23 0001  Diet NPO time specified Except for: Sips with Meds  Diet effective midnight       Question:  Except for  Answer:  Sips with Meds   07/17/23 1230            Objective: Vitals:   07/17/23 1409 07/17/23 1410 07/17/23 2140 07/18/23 0422  BP: (!) 190/48  (!) 168/62 (!) 164/74  Pulse: 61  62 (!) 55  Resp: 18  17 18   Temp:  98.6 F (37 C) 98.7 F (37.1 C) 98.4 F (36.9 C)  TempSrc:  Oral Oral Oral  SpO2: 100%  100% 100%    Intake/Output Summary (Last 24 hours) at 07/18/2023 1259 Last data filed at 07/18/2023 1000 Gross per 24 hour  Intake 1884.58 ml  Output 200 ml  Net 1684.58 ml   There were no vitals filed for this visit.  Scheduled Meds:  amLODipine   10 mg Oral Daily   carbidopa -levodopa   1 tablet Oral QHS   carbidopa -levodopa   1 tablet Oral Q2000   carbidopa -levodopa   2 tablet Oral BID   enoxaparin  (LOVENOX )  injection  40 mg Subcutaneous QHS   senna-docusate  1 tablet Oral QHS   traZODone   50 mg Oral QHS   Continuous Infusions:  Nutritional status     There is no height or weight on file to calculate BMI.  Data Reviewed:   CBC: Recent Labs  Lab 07/16/23 1334 07/17/23 0538 07/18/23 0445  WBC 6.7 10.3 5.7  NEUTROABS 4.3  --  3.7  HGB 11.3* 10.2* 10.4*  HCT 37.0* 32.1* 33.5*  MCV 86.7 85.8 87.0  PLT 175 138* 142*   Basic Metabolic Panel: Recent Labs   Lab 07/16/23 1334 07/17/23 0538 07/18/23 0445  NA 138 141 138  K 4.6 4.4 4.6  CL 109 110 108  CO2 23 22 21*  GLUCOSE 146* 106* 79  BUN 32* 33* 33*  CREATININE 1.89* 1.99* 1.84*  CALCIUM 9.2 9.0 8.9  MG  --   --  1.9   GFR: CrCl cannot be calculated (Unknown ideal weight.). Liver Function Tests: Recent Labs  Lab 07/18/23 0445  AST 22  ALT <5  ALKPHOS 63  BILITOT 2.2*  PROT 7.1  ALBUMIN 3.1*   No results for input(s): "LIPASE", "AMYLASE" in the last 168 hours. No results for input(s): "AMMONIA" in the last 168 hours. Coagulation Profile: Recent Labs  Lab 07/17/23 0812  INR 1.2   Cardiac Enzymes: No results for input(s): "CKTOTAL", "CKMB", "CKMBINDEX", "TROPONINI" in the last 168 hours. BNP (last 3 results) No results for input(s): "PROBNP" in the last 8760 hours. HbA1C: No results for input(s): "HGBA1C" in the last 72 hours. CBG: No results for input(s): "GLUCAP" in the last 168 hours. Lipid Profile: No results for input(s): "CHOL", "HDL", "LDLCALC", "TRIG", "CHOLHDL", "LDLDIRECT" in the last 72 hours. Thyroid  Function Tests: No results for input(s): "TSH", "T4TOTAL", "FREET4", "T3FREE", "THYROIDAB" in the last 72 hours. Anemia Panel: Recent Labs    07/17/23 0538  VITAMINB12 379   Sepsis Labs: No results for input(s): "PROCALCITON", "LATICACIDVEN" in the last 168 hours.  No results found for this or any previous visit (from the past 240 hours).       Radiology Studies: No results found.         LOS: 2 days   Time spent= 35 mins    Maggie Schooner, MD [facility]  If 7PM-7AM, please contact night-coverage  07/18/2023, 12:59 PM

## 2023-07-19 DIAGNOSIS — T83021A Displacement of indwelling urethral catheter, initial encounter: Secondary | ICD-10-CM | POA: Diagnosis not present

## 2023-07-19 LAB — CBC
HCT: 27.8 % — ABNORMAL LOW (ref 39.0–52.0)
Hemoglobin: 8.9 g/dL — ABNORMAL LOW (ref 13.0–17.0)
MCH: 27.6 pg (ref 26.0–34.0)
MCHC: 32 g/dL (ref 30.0–36.0)
MCV: 86.1 fL (ref 80.0–100.0)
Platelets: 129 10*3/uL — ABNORMAL LOW (ref 150–400)
RBC: 3.23 MIL/uL — ABNORMAL LOW (ref 4.22–5.81)
RDW: 13.3 % (ref 11.5–15.5)
WBC: 6.1 10*3/uL (ref 4.0–10.5)
nRBC: 0 % (ref 0.0–0.2)

## 2023-07-19 LAB — MAGNESIUM: Magnesium: 1.7 mg/dL (ref 1.7–2.4)

## 2023-07-19 LAB — BASIC METABOLIC PANEL
Anion gap: 11 (ref 5–15)
BUN: 36 mg/dL — ABNORMAL HIGH (ref 8–23)
CO2: 22 mmol/L (ref 22–32)
Calcium: 8.9 mg/dL (ref 8.9–10.3)
Chloride: 104 mmol/L (ref 98–111)
Creatinine, Ser: 1.89 mg/dL — ABNORMAL HIGH (ref 0.61–1.24)
GFR, Estimated: 36 mL/min — ABNORMAL LOW (ref >60)
Glucose, Bld: 103 mg/dL — ABNORMAL HIGH (ref 70–99)
Potassium: 4 mmol/L (ref 3.5–5.1)
Sodium: 137 mmol/L (ref 135–145)

## 2023-07-19 LAB — PHOSPHORUS: Phosphorus: 3 mg/dL (ref 2.5–4.6)

## 2023-07-19 MED ORDER — SENNOSIDES-DOCUSATE SODIUM 8.6-50 MG PO TABS: 1.0000 | ORAL_TABLET | Freq: Every evening | ORAL | Status: DC | PRN

## 2023-07-19 MED ORDER — LIDOCAINE-EPINEPHRINE 1 %-1:100000 IJ SOLN
INTRAMUSCULAR | Status: AC
  Filled 2023-07-19: qty 1

## 2023-07-19 MED ORDER — AMLODIPINE BESYLATE 10 MG PO TABS
10.0000 mg | ORAL_TABLET | Freq: Every day | ORAL | Status: AC
Start: 2023-07-20 — End: ?

## 2023-07-19 MED ORDER — SODIUM CHLORIDE 0.9 % IV BOLUS
500.0000 mL | Freq: Once | INTRAVENOUS | Status: AC
  Administered 2023-07-19: 500 mL via INTRAVENOUS

## 2023-07-19 MED ORDER — OXYCODONE HCL 5 MG PO TABS: 5.0000 mg | ORAL_TABLET | Freq: Four times a day (QID) | ORAL | 0 refills | Status: AC | PRN

## 2023-07-19 MED ORDER — OXYCODONE HCL 5 MG PO TABS: 5.0000 mg | ORAL_TABLET | Freq: Four times a day (QID) | ORAL | 0 refills | Status: DC | PRN

## 2023-07-19 NOTE — Progress Notes (Signed)
Dressing placed by night shift RN saturated with blood. When dressing taken down, continuous bleeding noted from cath site. Manual pressure applied x 10 minutes. Bleeding stopped. IR PA contacted.

## 2023-07-19 NOTE — Progress Notes (Signed)
20mL xylocaine w epi injected around suprapubic insertion site by IR MD d/t bleeding. Clean dressing applied.

## 2023-07-19 NOTE — Progress Notes (Signed)
Reviewed chart Subcoronal hypospadias- unable to reinsert foley due to ? Stricture IR s/p tube yesterday draining clear urine Dressing saturated red blood Hb 8.9 Change dressing and observe Likely from abdominal wall

## 2023-07-19 NOTE — Progress Notes (Signed)
Subjective: Pt was sleeping on rounds.  S/p SPT with IR - some bloody drainage around SPT site, SPT site draining well with good UOP  Objective: Vital signs in last 24 hours: Temp:  [97.6 F (36.4 C)-98.8 F (37.1 C)] 98.8 F (37.1 C) (02/11 0535) Pulse Rate:  [55-65] 64 (02/11 0535) Resp:  [12-19] 18 (02/11 0535) BP: (119-221)/(52-84) 119/52 (02/11 0535) SpO2:  [100 %] 100 % (02/11 0535)  Assessment/Plan: #enlarged prostate #urinary retention #chronic indwelling foley catheter #urethral erosion  Foley catheter was unable to be placed by nursing and Urology. Urethral stricture initially POD1 from SPT. Recommend continued gauze and bandages around SPT during healing, ensure patency of foley catheter Pt with scheduled follow-up on 07/21/2023 with Dr. Sherron Monday if discharges in time - will need monthly exchanges   Intake/Output from previous day: 02/10 0701 - 02/11 0700 In: 840 [P.O.:840] Out: 950 [Urine:950]  Intake/Output this shift: No intake/output data recorded.  Physical Exam:  General: Sleep CV: No cyanosis Lungs: equal chest rise   Lab Results: Recent Labs    07/17/23 0538 07/18/23 0445 07/19/23 0459  HGB 10.2* 10.4* 8.9*  HCT 32.1* 33.5* 27.8*   BMET Recent Labs    07/18/23 0445 07/19/23 0459  NA 138 137  K 4.6 4.0  CL 108 104  CO2 21* 22  GLUCOSE 79 103*  BUN 33* 36*  CREATININE 1.84* 1.89*  CALCIUM 8.9 8.9     Studies/Results: CT PELVIS LIMITED WO CONTRAST Result Date: 07/18/2023 CLINICAL DATA:  BPH, history of chronic Foley catheter, urethral erosion EXAM: CT GUIDED SUPRAPUBIC CATHETER PLACEMENT COMPARISON:  01/18/2023 ANESTHESIA/SEDATION: Intravenous Fentanyl and Versed 1mg  were administered by RN during a total moderate (conscious) sedation time of 30 minutes; the patient's level of consciousness and physiological / cardiorespiratory status were monitored continuously by radiology RN under my direct supervision. PROCEDURE:  Informed written consent was obtained from the patient after a thorough discussion of the procedural risks, benefits and alternatives. All questions were addressed. Maximal Sterile Barrier Technique was utilized including caps, mask, sterile gowns, sterile gloves, sterile drape, hand hygiene and skin antiseptic. A timeout was performed prior to the initiation of the procedure. Select axial CT scans were obtained through the urinary bladder. No Foley catheter was present. Patient was returned before, but after discussion with the floor, Foley catheter could not be placed even with a urology consultation. Therefore, patient returned to the CT gantry and localized. An appropriate skin entry site was determined and marked. Skin site was prepped with chlorhexidine, draped in usual sterile fashion, infiltrated locally with 1% lidocaine. Under CT fluoroscopic guidance, a 7 cm 5 French multi sidehole Yueh sheath needle was advanced into the urinary bladder from a suprapubic midline approach. Urine returned through the needle hub. The bladder was distended with 300 mL sterile saline. Amplatz guidewire advanced easily through the [place], position confirmed on CT. Tract dilated to facilitate placement of a 14 French pigtail catheter, formed within the lumen of the urinary bladder. Position confirmed on CT. Catheter secured externally with 0 Prolene suture and placed to Foley drain bag. The patient tolerated the procedure well. COMPLICATIONS: None immediate. IMPRESSION: 1. Technically successful CT-guided suprapubic catheter placement. Electronically Signed   By: Corlis Leak M.D.   On: 07/18/2023 16:32   CT GUIDED SUPERPUBIC CATHETER PLMT Result Date: 07/18/2023 CLINICAL DATA:  BPH, history of chronic Foley catheter, urethral erosion EXAM: CT GUIDED SUPRAPUBIC CATHETER PLACEMENT COMPARISON:  01/18/2023 ANESTHESIA/SEDATION: Intravenous Fentanyl and Versed  1mg  were administered by RN during a total moderate (conscious)  sedation time of 30 minutes; the patient's level of consciousness and physiological / cardiorespiratory status were monitored continuously by radiology RN under my direct supervision. PROCEDURE: Informed written consent was obtained from the patient after a thorough discussion of the procedural risks, benefits and alternatives. All questions were addressed. Maximal Sterile Barrier Technique was utilized including caps, mask, sterile gowns, sterile gloves, sterile drape, hand hygiene and skin antiseptic. A timeout was performed prior to the initiation of the procedure. Select axial CT scans were obtained through the urinary bladder. No Foley catheter was present. Patient was returned before, but after discussion with the floor, Foley catheter could not be placed even with a urology consultation. Therefore, patient returned to the CT gantry and localized. An appropriate skin entry site was determined and marked. Skin site was prepped with chlorhexidine, draped in usual sterile fashion, infiltrated locally with 1% lidocaine. Under CT fluoroscopic guidance, a 7 cm 5 French multi sidehole Yueh sheath needle was advanced into the urinary bladder from a suprapubic midline approach. Urine returned through the needle hub. The bladder was distended with 300 mL sterile saline. Amplatz guidewire advanced easily through the [place], position confirmed on CT. Tract dilated to facilitate placement of a 14 French pigtail catheter, formed within the lumen of the urinary bladder. Position confirmed on CT. Catheter secured externally with 0 Prolene suture and placed to Foley drain bag. The patient tolerated the procedure well. COMPLICATIONS: None immediate. IMPRESSION: 1. Technically successful CT-guided suprapubic catheter placement. Electronically Signed   By: Corlis Leak M.D.   On: 07/18/2023 16:32      LOS: 3 days   Roby Lofts, MD Resident Physician [facility]   07/19/2023, 7:20 AM

## 2023-07-19 NOTE — Progress Notes (Signed)
Patient ID: Theodore King, male   DOB: March 24, 1946, 78 y.o.   MRN: [id number] Called by patient's nurse regarding persistent bleeding from patient's suprapubic tube which was placed yesterday.  On exam suprapubic tube is intact, insertion site okay.  Nurse had been holding manual pressure at site for 15 minutes prior to my arrival with Dr. Milford Cage.  Lidocaine with epinephrine was injected subcutaneously around suprapubic catheter entry point.  No further bleeding noted.  New gauze dressing applied over site.  Quick clot hemostasis pad was given to nurse should further bleeding occur.  Dr. Loreta Ave is IR on call tonight if further questions arise.  Pager number is [phone].

## 2023-07-19 NOTE — Progress Notes (Signed)
PROGRESS NOTE    Theodore King  ZOX:[id number] DOB: 1946/03/16 DOA: 07/16/2023 PCP: [facility]    Brief Narrative:  78 y.o. male with medical history significant for dementia, Parkinson's disease, HTN, HLD, CKD 3B, urinary retention and BPH s/p chronic indwelling urinary catheter who present to [facility] hospital ED  from SNF after his urinary catheter was accidentally removed.     There has been significant difficulty with inserting a urethral catheter and therefore upon conversing with urology and interventional radiology plan is for patient to undergo suprapubic catheter placement on 2/10.      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  Difficult to place Foley by urology due to stricture.  Eventually CT-guided supra catheter placed 2/10, 39F.  Will need monthly changes.  Urology to arrange for outpatient follow-up. 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  Continue Sinemet  DVT prophylaxis: Lovenox    Code Status: Full Code Family Communication:   Status is: Inpatient Remains inpatient appropriate because:  Discharge back to  long-term care facility hopefully tomorrow   Subjective: Overnight had some bleeding around suprapubic catheter site.  Having poor oral intake with slightly dark urine.  Examination:  General exam: Appears calm and comfortable  Respiratory system: Clear to auscultation. Respiratory effort normal. Cardiovascular system: S1 & S2 heard, RRR. No JVD, murmurs, rubs, gallops or clicks. No pedal edema. Gastrointestinal system: Abdomen is nondistended, soft and nontender. No organomegaly or masses felt. Normal bowel sounds heard. Central nervous system: Alert and oriented. No focal neurological deficits. Extremities: Symmetric 5 x 5 power. Skin: No rashes, lesions or ulcers Psychiatry: Judgement and insight appear poor Super pubic catheter in place               Diet Orders (From admission, onward)     Start     Ordered   07/18/23 1639  Diet regular Room service appropriate? Yes; Fluid consistency: Thin  Diet effective now       Question Answer Comment  Room service appropriate? Yes   Fluid consistency: Thin      07/18/23 1638            Objective: Vitals:   07/18/23 2053 07/19/23 0159 07/19/23 0535 07/19/23 1228  BP: 132/74 (!) 168/82 (!) 119/52 (!) 142/86  Pulse: 62 65 64 74  Resp: 18 18 18 18   Temp: 98.5 F (36.9 C) 97.9 F (36.6 C) 98.8 F (37.1 C) 98.5 F (36.9 C)  TempSrc: Oral Oral Oral Oral  SpO2: 100% 100% 100% 98%    Intake/Output Summary (Last 24 hours) at 07/19/2023 1234 Last data filed at 07/19/2023 1000 Gross per 24 hour  Intake 960 ml  Output 1050 ml  Net -90 ml  There were no vitals filed for this visit.  Scheduled Meds:  amLODipine  10 mg Oral Daily   carbidopa-levodopa  1 tablet Oral QHS   carbidopa-levodopa  1 tablet Oral Q2000   carbidopa-levodopa  2 tablet Oral BID   enoxaparin (LOVENOX) injection  40 mg Subcutaneous QHS   senna-docusate  1 tablet Oral QHS   traZODone  50 mg Oral QHS   Continuous Infusions:  Nutritional  status     There is no height or weight on file to calculate BMI.  Data Reviewed:   CBC: Recent Labs  Lab 07/16/23 1334 07/17/23 0538 07/18/23 0445 07/19/23 0459  WBC 6.7 10.3 5.7 6.1  NEUTROABS 4.3  --  3.7  --   HGB 11.3* 10.2* 10.4* 8.9*  HCT 37.0* 32.1* 33.5* 27.8*  MCV 86.7 85.8 87.0 86.1  PLT 175 138* 142* 129*   Basic Metabolic Panel: Recent Labs  Lab 07/16/23 1334 07/17/23 0538 07/18/23 0445 07/19/23 0459  NA 138 141 138 137  K 4.6 4.4 4.6 4.0  CL 109 110 108 104  CO2 23 22 21* 22  GLUCOSE 146* 106* 79 103*  BUN 32* 33* 33* 36*  CREATININE 1.89* 1.99* 1.84* 1.89*  CALCIUM 9.2 9.0 8.9 8.9  MG  --   --  1.9 1.7  PHOS  --   --   --  3.0   GFR: CrCl cannot be calculated (Unknown ideal weight.). Liver Function Tests: Recent Labs  Lab 07/18/23 0445  AST 22  ALT <5  ALKPHOS 63  BILITOT 2.2*  PROT 7.1  ALBUMIN 3.1*   No results for input(s): "LIPASE", "AMYLASE" in the last 168 hours. No results for input(s): "AMMONIA" in the last 168 hours. Coagulation Profile: Recent Labs  Lab 07/17/23 0812  INR 1.2   Cardiac Enzymes: No results for input(s): "CKTOTAL", "CKMB", "CKMBINDEX", "TROPONINI" in the last 168 hours. BNP (last 3 results) No results for input(s): "PROBNP" in the last 8760 hours. HbA1C: No results for input(s): "HGBA1C" in the last 72 hours. CBG: No results for input(s): "GLUCAP" in the last 168 hours. Lipid Profile: No results for input(s): "CHOL", "HDL", "LDLCALC", "TRIG", "CHOLHDL", "LDLDIRECT" in the last 72 hours. Thyroid Function Tests: No results for input(s): "TSH", "T4TOTAL", "FREET4", "T3FREE", "THYROIDAB" in the last 72 hours. Anemia Panel: Recent Labs    07/17/23 0538  VITAMINB12 379   Sepsis Labs: No results for input(s): "PROCALCITON", "LATICACIDVEN" in the last 168 hours.  No results found for this or any previous visit (from the past 240 hours).       Radiology Studies: CT PELVIS LIMITED WO CONTRAST Result  Date: 07/18/2023 CLINICAL DATA:  BPH, history of chronic Foley catheter, urethral erosion EXAM: CT GUIDED SUPRAPUBIC CATHETER PLACEMENT COMPARISON:  01/18/2023 ANESTHESIA/SEDATION: Intravenous Fentanyl and Versed 1mg  were administered by RN during a total moderate (conscious) sedation time of 30 minutes; the patient's level of consciousness and physiological / cardiorespiratory status were monitored continuously by radiology RN under my direct supervision. PROCEDURE: Informed written consent was obtained from the patient after a thorough discussion of the procedural risks, benefits and alternatives. All questions were addressed. Maximal Sterile Barrier Technique was utilized including caps, mask, sterile gowns, sterile gloves, sterile drape, hand hygiene and skin antiseptic. A timeout was performed prior to the initiation of the procedure. Select axial CT scans were obtained through the urinary bladder. No Foley catheter was present. Patient was returned before, but after discussion with the floor, Foley catheter could not be placed  even with a urology consultation. Therefore, patient returned to the CT gantry and localized. An appropriate skin entry site was determined and marked. Skin site was prepped with chlorhexidine, draped in usual sterile fashion, infiltrated locally with 1% lidocaine. Under CT fluoroscopic guidance, a 7 cm 5 French multi sidehole Yueh sheath needle was advanced into the urinary bladder from a suprapubic midline approach. Urine returned through the needle hub. The bladder was distended with 300 mL sterile saline. Amplatz guidewire advanced easily through the [place], position confirmed on CT. Tract dilated to facilitate placement of a 14 French pigtail catheter, formed within the lumen of the urinary bladder. Position confirmed on CT. Catheter secured externally with 0 Prolene suture and placed to Foley drain bag. The patient tolerated the procedure well. COMPLICATIONS: None immediate.  IMPRESSION: 1. Technically successful CT-guided suprapubic catheter placement. Electronically Signed   By: Corlis Leak M.D.   On: 07/18/2023 16:32   CT GUIDED SUPERPUBIC CATHETER PLMT Result Date: 07/18/2023 CLINICAL DATA:  BPH, history of chronic Foley catheter, urethral erosion EXAM: CT GUIDED SUPRAPUBIC CATHETER PLACEMENT COMPARISON:  01/18/2023 ANESTHESIA/SEDATION: Intravenous Fentanyl and Versed 1mg  were administered by RN during a total moderate (conscious) sedation time of 30 minutes; the patient's level of consciousness and physiological / cardiorespiratory status were monitored continuously by radiology RN under my direct supervision. PROCEDURE: Informed written consent was obtained from the patient after a thorough discussion of the procedural risks, benefits and alternatives. All questions were addressed. Maximal Sterile Barrier Technique was utilized including caps, mask, sterile gowns, sterile gloves, sterile drape, hand hygiene and skin antiseptic. A timeout was performed prior to the initiation of the procedure. Select axial CT scans were obtained through the urinary bladder. No Foley catheter was present. Patient was returned before, but after discussion with the floor, Foley catheter could not be placed even with a urology consultation. Therefore, patient returned to the CT gantry and localized. An appropriate skin entry site was determined and marked. Skin site was prepped with chlorhexidine, draped in usual sterile fashion, infiltrated locally with 1% lidocaine. Under CT fluoroscopic guidance, a 7 cm 5 French multi sidehole Yueh sheath needle was advanced into the urinary bladder from a suprapubic midline approach. Urine returned through the needle hub. The bladder was distended with 300 mL sterile saline. Amplatz guidewire advanced easily through the [place], position confirmed on CT. Tract dilated to facilitate placement of a 14 French pigtail catheter, formed within the lumen of the  urinary bladder. Position confirmed on CT. Catheter secured externally with 0 Prolene suture and placed to Foley drain bag. The patient tolerated the procedure well. COMPLICATIONS: None immediate. IMPRESSION: 1. Technically successful CT-guided suprapubic catheter placement. Electronically Signed   By: Corlis Leak M.D.   On: 07/18/2023 16:32           LOS: 3 days   Time spent= 35 mins    Miguel Rota, MD [facility]  If 7PM-7AM, please contact night-coverage  07/19/2023, 12:34 PM

## 2023-07-20 DIAGNOSIS — T83021A Displacement of indwelling urethral catheter, initial encounter: Secondary | ICD-10-CM | POA: Diagnosis not present

## 2023-07-20 LAB — CBC
HCT: 23 % — ABNORMAL LOW (ref 39.0–52.0)
Hemoglobin: 7.3 g/dL — ABNORMAL LOW (ref 13.0–17.0)
MCH: 27 pg (ref 26.0–34.0)
MCHC: 31.7 g/dL (ref 30.0–36.0)
MCV: 85.2 fL (ref 80.0–100.0)
Platelets: 123 10*3/uL — ABNORMAL LOW (ref 150–400)
RBC: 2.7 MIL/uL — ABNORMAL LOW (ref 4.22–5.81)
RDW: 13.7 % (ref 11.5–15.5)
WBC: 6.5 10*3/uL (ref 4.0–10.5)
nRBC: 0 % (ref 0.0–0.2)

## 2023-07-20 LAB — BASIC METABOLIC PANEL
Anion gap: 10 (ref 5–15)
BUN: 41 mg/dL — ABNORMAL HIGH (ref 8–23)
CO2: 23 mmol/L (ref 22–32)
Calcium: 8.5 mg/dL — ABNORMAL LOW (ref 8.9–10.3)
Chloride: 106 mmol/L (ref 98–111)
Creatinine, Ser: 2.45 mg/dL — ABNORMAL HIGH (ref 0.61–1.24)
GFR, Estimated: 26 mL/min — ABNORMAL LOW (ref >60)
Glucose, Bld: 102 mg/dL — ABNORMAL HIGH (ref 70–99)
Potassium: 4 mmol/L (ref 3.5–5.1)
Sodium: 139 mmol/L (ref 135–145)

## 2023-07-20 LAB — PREPARE RBC (CROSSMATCH)

## 2023-07-20 LAB — HEMOGLOBIN AND HEMATOCRIT, BLOOD
HCT: 26.1 % — ABNORMAL LOW (ref 39.0–52.0)
Hemoglobin: 8.1 g/dL — ABNORMAL LOW (ref 13.0–17.0)

## 2023-07-20 LAB — CREATININE, SERUM
Creatinine, Ser: 2.12 mg/dL — ABNORMAL HIGH (ref 0.61–1.24)
GFR, Estimated: 31 mL/min — ABNORMAL LOW (ref >60)

## 2023-07-20 LAB — MAGNESIUM: Magnesium: 1.9 mg/dL (ref 1.7–2.4)

## 2023-07-20 MED ORDER — SODIUM CHLORIDE 0.9 % IV BOLUS
500.0000 mL | Freq: Once | INTRAVENOUS | Status: AC
  Administered 2023-07-20: 500 mL via INTRAVENOUS

## 2023-07-20 MED ORDER — ENOXAPARIN SODIUM 30 MG/0.3ML IJ SOSY
30.0000 mg | PREFILLED_SYRINGE | Freq: Every day | INTRAMUSCULAR | Status: DC
  Administered 2023-07-20 – 2023-07-21 (×2): 30 mg via SUBCUTANEOUS
  Filled 2023-07-20 (×2): qty 0.3

## 2023-07-20 MED ORDER — SODIUM CHLORIDE 0.9 % IV SOLN: INTRAVENOUS | Status: AC

## 2023-07-20 MED ORDER — SODIUM CHLORIDE 0.9% IV SOLUTION: Freq: Once | INTRAVENOUS | Status: AC

## 2023-07-20 NOTE — Progress Notes (Signed)
Subjective: Pt resting comfortably NO acute complaints, foley draining well, minimal saturation around sPT site  Objective: Vital signs in last 24 hours: Temp:  [97.7 F (36.5 C)-98.5 F (36.9 C)] 97.7 F (36.5 C) (02/12 0455) Pulse Rate:  [57-74] 57 (02/12 0455) Resp:  [18] 18 (02/12 0455) BP: (119-142)/(49-86) 119/49 (02/12 0455) SpO2:  [98 %-100 %] 100 % (02/12 0455)  Assessment/Plan: #enlarged prostate #urinary retention #chronic indwelling foley catheter #urethral erosion  Foley catheter was unable to be placed by nursing and Urology. Urethral stricture initially Mild drop in Hg today - likely dilutional with combination of some abdominal oozing around SPT site, corrected by IR with injection of lido with epi. Now dry.  POD2 from SPT. Recommend continued gauze and bandages around SPT during healing, ensure patency of foley catheter Pt with scheduled follow-up on 07/21/2023 with Dr. Sherron Monday if discharges in time - will need monthly exchanges  Urology will sign off at this time  Intake/Output from previous day: 02/11 0701 - 02/12 0700 In: 1220.2 [P.O.:720; IV Piggyback:500.2] Out: 1300 [Urine:1300]  Intake/Output this shift: No intake/output data recorded.  Physical Exam:  General: Sleep CV: No cyanosis Lungs: equal chest rise   Lab Results: Recent Labs    07/18/23 0445 07/19/23 0459 07/20/23 0519  HGB 10.4* 8.9* 7.3*  HCT 33.5* 27.8* 23.0*   BMET Recent Labs    07/19/23 0459 07/20/23 0519  NA 137 139  K 4.0 4.0  CL 104 106  CO2 22 23  GLUCOSE 103* 102*  BUN 36* 41*  CREATININE 1.89* 2.45*  CALCIUM 8.9 8.5*     Studies/Results: CT PELVIS LIMITED WO CONTRAST Result Date: 07/18/2023 CLINICAL DATA:  BPH, history of chronic Foley catheter, urethral erosion EXAM: CT GUIDED SUPRAPUBIC CATHETER PLACEMENT COMPARISON:  01/18/2023 ANESTHESIA/SEDATION: Intravenous Fentanyl and Versed 1mg  were administered by RN during a total moderate  (conscious) sedation time of 30 minutes; the patient's level of consciousness and physiological / cardiorespiratory status were monitored continuously by radiology RN under my direct supervision. PROCEDURE: Informed written consent was obtained from the patient after a thorough discussion of the procedural risks, benefits and alternatives. All questions were addressed. Maximal Sterile Barrier Technique was utilized including caps, mask, sterile gowns, sterile gloves, sterile drape, hand hygiene and skin antiseptic. A timeout was performed prior to the initiation of the procedure. Select axial CT scans were obtained through the urinary bladder. No Foley catheter was present. Patient was returned before, but after discussion with the floor, Foley catheter could not be placed even with a urology consultation. Therefore, patient returned to the CT gantry and localized. An appropriate skin entry site was determined and marked. Skin site was prepped with chlorhexidine, draped in usual sterile fashion, infiltrated locally with 1% lidocaine. Under CT fluoroscopic guidance, a 7 cm 5 French multi sidehole Yueh sheath needle was advanced into the urinary bladder from a suprapubic midline approach. Urine returned through the needle hub. The bladder was distended with 300 mL sterile saline. Amplatz guidewire advanced easily through the [place], position confirmed on CT. Tract dilated to facilitate placement of a 14 French pigtail catheter, formed within the lumen of the urinary bladder. Position confirmed on CT. Catheter secured externally with 0 Prolene suture and placed to Foley drain bag. The patient tolerated the procedure well. COMPLICATIONS: None immediate. IMPRESSION: 1. Technically successful CT-guided suprapubic catheter placement. Electronically Signed   By: Corlis Leak M.D.   On: 07/18/2023 16:32   CT GUIDED SUPERPUBIC CATHETER PLMT  Result Date: 07/18/2023 CLINICAL DATA:  BPH, history of chronic Foley catheter,  urethral erosion EXAM: CT GUIDED SUPRAPUBIC CATHETER PLACEMENT COMPARISON:  01/18/2023 ANESTHESIA/SEDATION: Intravenous Fentanyl and Versed 1mg  were administered by RN during a total moderate (conscious) sedation time of 30 minutes; the patient's level of consciousness and physiological / cardiorespiratory status were monitored continuously by radiology RN under my direct supervision. PROCEDURE: Informed written consent was obtained from the patient after a thorough discussion of the procedural risks, benefits and alternatives. All questions were addressed. Maximal Sterile Barrier Technique was utilized including caps, mask, sterile gowns, sterile gloves, sterile drape, hand hygiene and skin antiseptic. A timeout was performed prior to the initiation of the procedure. Select axial CT scans were obtained through the urinary bladder. No Foley catheter was present. Patient was returned before, but after discussion with the floor, Foley catheter could not be placed even with a urology consultation. Therefore, patient returned to the CT gantry and localized. An appropriate skin entry site was determined and marked. Skin site was prepped with chlorhexidine, draped in usual sterile fashion, infiltrated locally with 1% lidocaine. Under CT fluoroscopic guidance, a 7 cm 5 French multi sidehole Yueh sheath needle was advanced into the urinary bladder from a suprapubic midline approach. Urine returned through the needle hub. The bladder was distended with 300 mL sterile saline. Amplatz guidewire advanced easily through the [place], position confirmed on CT. Tract dilated to facilitate placement of a 14 French pigtail catheter, formed within the lumen of the urinary bladder. Position confirmed on CT. Catheter secured externally with 0 Prolene suture and placed to Foley drain bag. The patient tolerated the procedure well. COMPLICATIONS: None immediate. IMPRESSION: 1. Technically successful CT-guided suprapubic catheter  placement. Electronically Signed   By: Corlis Leak M.D.   On: 07/18/2023 16:32      LOS: 4 days   Roby Lofts, MD Resident Physician [facility]   07/20/2023, 8:11 AM

## 2023-07-20 NOTE — Progress Notes (Signed)
PROGRESS NOTE    Theodore King  ZOX:[id number] DOB: 27-Feb-1946 DOA: 07/16/2023 PCP: [facility]    Brief Narrative:  78 y.o. male with medical history significant for dementia, Parkinson's disease, HTN, HLD, CKD 3B, urinary retention and BPH s/p chronic indwelling urinary catheter who present to [facility] ED  from SNF after his urinary catheter was accidentally removed.     There has been significant difficulty with inserting a urethral catheter and therefore upon conversing with urology and interventional radiology plan is for patient to undergo suprapubic catheter placement on 2/10.      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  Difficult to place Foley by urology due to stricture.  Eventually CT-guided supra catheter placed 2/10, 29F.  Will need monthly changes.  Urology to arrange for outpatient follow-up. 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  Likely some blood loss related to the procedure and dilution in nature. Will give 1U PRBC   Essential hypertension Continue Norvasc.  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  Continue Sinemet  DVT prophylaxis: Lovenox    Code Status:  Full Code Family Communication:  Called [name].  Status is: Inpatient Remains inpatient appropriate because:  Discharge back to long-term care facility hopefully tomorrow   Subjective: Overnight had some bleeding around suprapubic catheter site.  Having poor oral intake with slightly dark urine.  Examination:  General exam: Appears calm and comfortable  Respiratory system: Clear to auscultation. Respiratory effort normal. Cardiovascular system: S1 & S2 heard, RRR. No JVD, murmurs, rubs, gallops or clicks. No pedal edema. Gastrointestinal system: Abdomen is nondistended, soft and nontender. No organomegaly or masses felt. Normal bowel sounds heard. Central nervous system: Alert and oriented. No focal neurological deficits. Extremities: Symmetric 5 x 5 power. Skin: No rashes, lesions or ulcers Psychiatry: Judgement and insight appear poor Super pubic catheter in place               Diet Orders (From admission, onward)     Start     Ordered   07/18/23 1639  Diet regular Room service appropriate? Yes; Fluid consistency: Thin  Diet effective now       Question Answer Comment  Room service appropriate? Yes   Fluid consistency: Thin      07/18/23 1638            Objective: Vitals:   07/19/23 1228 07/19/23 1320 07/19/23 2114 07/20/23 0455  BP: (!) 142/86 (!) 123/49 (!) 124/52 (!) 119/49  Pulse: 74 70 66 (!) 57  Resp: 18 18 18 18   Temp: 98.5 F (36.9 C) 97.9 F (36.6 C) 98.3 F (36.8 C) 97.7 F (36.5 C)  TempSrc: Oral Oral Oral Oral  SpO2: 98% 100% 100% 100%    Intake/Output Summary (Last 24  hours) at 07/20/2023 1142 Last data filed at 07/20/2023 1002 Gross per 24 hour  Intake 1580.16 ml  Output 1500 ml  Net 80.16 ml   There were no vitals filed for this visit.  Scheduled Meds:  amLODipine  10 mg Oral Daily   carbidopa-levodopa  1 tablet Oral QHS   carbidopa-levodopa  1 tablet Oral Q2000   carbidopa-levodopa  2 tablet Oral BID   enoxaparin (LOVENOX)  injection  40 mg Subcutaneous QHS   senna-docusate  1 tablet Oral QHS   traZODone  50 mg Oral QHS   Continuous Infusions:  sodium chloride Stopped (07/20/23 1058)    Nutritional status     There is no height or weight on file to calculate BMI.  Data Reviewed:   CBC: Recent Labs  Lab 07/16/23 1334 07/17/23 0538 07/18/23 0445 07/19/23 0459 07/20/23 0519  WBC 6.7 10.3 5.7 6.1 6.5  NEUTROABS 4.3  --  3.7  --   --   HGB 11.3* 10.2* 10.4* 8.9* 7.3*  HCT 37.0* 32.1* 33.5* 27.8* 23.0*  MCV 86.7 85.8 87.0 86.1 85.2  PLT 175 138* 142* 129* 123*   Basic Metabolic Panel: Recent Labs  Lab 07/16/23 1334 07/17/23 0538 07/18/23 0445 07/19/23 0459 07/20/23 0519  NA 138 141 138 137 139  K 4.6 4.4 4.6 4.0 4.0  CL 109 110 108 104 106  CO2 23 22 21* 22 23  GLUCOSE 146* 106* 79 103* 102*  BUN 32* 33* 33* 36* 41*  CREATININE 1.89* 1.99* 1.84* 1.89* 2.45*  CALCIUM 9.2 9.0 8.9 8.9 8.5*  MG  --   --  1.9 1.7 1.9  PHOS  --   --   --  3.0  --    GFR: CrCl cannot be calculated (Unknown ideal weight.). Liver Function Tests: Recent Labs  Lab 07/18/23 0445  AST 22  ALT <5  ALKPHOS 63  BILITOT 2.2*  PROT 7.1  ALBUMIN 3.1*   No results for input(s): "LIPASE", "AMYLASE" in the last 168 hours. No results for input(s): "AMMONIA" in the last 168 hours. Coagulation Profile: Recent Labs  Lab 07/17/23 0812  INR 1.2   Cardiac Enzymes: No results for input(s): "CKTOTAL", "CKMB", "CKMBINDEX", "TROPONINI" in the last 168 hours. BNP (last 3 results) No results for input(s): "PROBNP" in the last 8760 hours. HbA1C: No results for input(s): "HGBA1C" in the last 72 hours. CBG: No results for input(s): "GLUCAP" in the last 168 hours. Lipid Profile: No results for input(s): "CHOL", "HDL", "LDLCALC", "TRIG", "CHOLHDL", "LDLDIRECT" in the last 72 hours. Thyroid Function Tests: No results for input(s): "TSH", "T4TOTAL", "FREET4", "T3FREE", "THYROIDAB" in the last 72 hours. Anemia  Panel: No results for input(s): "VITAMINB12", "FOLATE", "FERRITIN", "TIBC", "IRON", "RETICCTPCT" in the last 72 hours. Sepsis Labs: No results for input(s): "PROCALCITON", "LATICACIDVEN" in the last 168 hours.  No results found for this or any previous visit (from the past 240 hours).       Radiology Studies: CT PELVIS LIMITED WO CONTRAST Result Date: 07/18/2023 CLINICAL DATA:  BPH, history of chronic Foley catheter, urethral erosion EXAM: CT GUIDED SUPRAPUBIC CATHETER PLACEMENT COMPARISON:  01/18/2023 ANESTHESIA/SEDATION: Intravenous Fentanyl and Versed 1mg  were administered by RN during a total moderate (conscious) sedation time of 30 minutes; the patient's level of consciousness and physiological / cardiorespiratory status were monitored continuously by radiology RN under my direct supervision. PROCEDURE: Informed written consent was obtained from the patient after a thorough discussion of the procedural risks, benefits and alternatives. All questions were  addressed. Maximal Sterile Barrier Technique was utilized including caps, mask, sterile gowns, sterile gloves, sterile drape, hand hygiene and skin antiseptic. A timeout was performed prior to the initiation of the procedure. Select axial CT scans were obtained through the urinary bladder. No Foley catheter was present. Patient was returned before, but after discussion with the floor, Foley catheter could not be placed even with a urology consultation. Therefore, patient returned to the CT gantry and localized. An appropriate skin entry site was determined and marked. Skin site was prepped with chlorhexidine, draped in usual sterile fashion, infiltrated locally with 1% lidocaine. Under CT fluoroscopic guidance, a 7 cm 5 French multi sidehole Yueh sheath needle was advanced into the urinary bladder from a suprapubic midline approach. Urine returned through the needle hub. The bladder was distended with 300 mL sterile saline. Amplatz guidewire  advanced easily through the [place], position confirmed on CT. Tract dilated to facilitate placement of a 14 French pigtail catheter, formed within the lumen of the urinary bladder. Position confirmed on CT. Catheter secured externally with 0 Prolene suture and placed to Foley drain bag. The patient tolerated the procedure well. COMPLICATIONS: None immediate. IMPRESSION: 1. Technically successful CT-guided suprapubic catheter placement. Electronically Signed   By: Corlis Leak M.D.   On: 07/18/2023 16:32   CT GUIDED SUPERPUBIC CATHETER PLMT Result Date: 07/18/2023 CLINICAL DATA:  BPH, history of chronic Foley catheter, urethral erosion EXAM: CT GUIDED SUPRAPUBIC CATHETER PLACEMENT COMPARISON:  01/18/2023 ANESTHESIA/SEDATION: Intravenous Fentanyl and Versed 1mg  were administered by RN during a total moderate (conscious) sedation time of 30 minutes; the patient's level of consciousness and physiological / cardiorespiratory status were monitored continuously by radiology RN under my direct supervision. PROCEDURE: Informed written consent was obtained from the patient after a thorough discussion of the procedural risks, benefits and alternatives. All questions were addressed. Maximal Sterile Barrier Technique was utilized including caps, mask, sterile gowns, sterile gloves, sterile drape, hand hygiene and skin antiseptic. A timeout was performed prior to the initiation of the procedure. Select axial CT scans were obtained through the urinary bladder. No Foley catheter was present. Patient was returned before, but after discussion with the floor, Foley catheter could not be placed even with a urology consultation. Therefore, patient returned to the CT gantry and localized. An appropriate skin entry site was determined and marked. Skin site was prepped with chlorhexidine, draped in usual sterile fashion, infiltrated locally with 1% lidocaine. Under CT fluoroscopic guidance, a 7 cm 5 French multi sidehole Yueh sheath  needle was advanced into the urinary bladder from a suprapubic midline approach. Urine returned through the needle hub. The bladder was distended with 300 mL sterile saline. Amplatz guidewire advanced easily through the [place], position confirmed on CT. Tract dilated to facilitate placement of a 14 French pigtail catheter, formed within the lumen of the urinary bladder. Position confirmed on CT. Catheter secured externally with 0 Prolene suture and placed to Foley drain bag. The patient tolerated the procedure well. COMPLICATIONS: None immediate. IMPRESSION: 1. Technically successful CT-guided suprapubic catheter placement. Electronically Signed   By: Corlis Leak M.D.   On: 07/18/2023 16:32           LOS: 4 days   Time spent= 35 mins    Miguel Rota, MD [facility]  If 7PM-7AM, please contact night-coverage  07/20/2023, 11:42 AM

## 2023-07-20 NOTE — TOC Initial Note (Signed)
 Transition of Care [facility]) - Initial/Assessment Note    Patient Details  Name: Theodore King MRN: [id number] Date of Birth: 01-21-1946  [facility]) CM/SW Contact:    Amada Jupiter, LCSW Phone Number: 07/20/2023, 3:55 PM  Clinical Narrative:                  Met with pt to introduce CSW/ TOC role with discharge planning.  Pt pleasant and confirms plan to return to [facility] at discharge. Daughter aware that pt may be medically ready for dc tomorrow and I have alerted facility as well.  Continue to follow to assist with dc process.  Expected Discharge Plan: Long Term Nursing Home Barriers to Discharge: Continued Medical Work up   Patient Goals and CMS Choice Patient states their goals for this hospitalization and ongoing recovery are:: return to SNF          Expected Discharge Plan and Services In-house Referral: Clinical Social Work     Living arrangements for the past 2 months: Skilled Nursing Facility                                      Prior Living Arrangements/Services Living arrangements for the past 2 months: Skilled Nursing Facility Lives with:: Facility Resident Patient language and need for interpreter reviewed:: Yes Do you feel safe going back to the place where you live?: Yes      Need for Family Participation in Patient Care: No (Comment) Care giver support system in place?: Yes (comment)   Criminal Activity/Legal Involvement Pertinent to Current Situation/Hospitalization: No - Comment as needed  Activities of Daily Living      Permission Sought/Granted Permission sought to share information with : Facility Medical sales representative, Family Supports Permission granted to share information with : Yes, Verbal Permission Granted  Share Information with NAME: daughter, Horst Ostermiller @ [phone]  Permission granted to share info w AGENCY: [facility] SNF        Emotional Assessment Appearance:: Appears stated  age Attitude/Demeanor/Rapport: Gracious Affect (typically observed): Accepting Orientation: : Oriented to Self, Oriented to Place, Oriented to  Time, Oriented to Situation Alcohol / Substance Use: Not Applicable Psych Involvement: No (comment)  Admission diagnosis:  Chronic indwelling Foley catheter [Z97.8] Displacement of Foley catheter, initial encounter [facility]) [W29.562Z] Patient Active Problem List   Diagnosis Date Noted   Chronic kidney disease, stage 3b (HCC) 07/17/2023   Anemia of chronic disease 07/17/2023   Displacement of Foley catheter, initial encounter (HCC) 07/16/2023   Atelectasis 01/18/2023   Chronic indwelling Foley catheter    Sepsis (HCC) 04/04/2022   Pressure injury of skin 02/03/2022   Urinary retention 02/01/2022   Foley catheter problem, initial encounter (HCC) 01/31/2022   Pulmonary nodule 01/31/2022   Syncope 11/18/2021   UTI (urinary tract infection) 11/12/2021   Essential hypertension 11/11/2021   Bacteremia 11/10/2021   Hypokalemia 11/09/2021   Normocytic anemia 11/09/2021   Benign neoplasm of transverse colon    Abnormal CT of the abdomen    Acute renal failure superimposed on stage 3a chronic kidney disease (HCC) 10/23/2021   Thrombocytopenia (HCC) 10/23/2021   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 10/23/2021   Hyperbilirubinemia 10/23/2021   Colonic mass 10/23/2021   Aortic atherosclerosis (HCC) 10/23/2021   History of 2019 novel coronavirus disease (COVID-19) 04/24/2019   Dysphonia 10/16/2018   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 [place] [address] Phone: [phone] Fax: [phone]     Social Drivers of Health (SDOH) Social History: SDOH Screenings   Food Insecurity: No Food Insecurity (07/17/2023)  Housing: Unknown (07/17/2023)  Transportation Needs: No Transportation Needs (07/17/2023)  Utilities: Not At Risk (07/17/2023)  Social Connections: Unknown (07/17/2023)  Tobacco Use: Medium Risk (07/17/2023)   SDOH Interventions:     Readmission Risk Interventions    07/20/2023    3:48 PM 01/19/2023    2:05 PM 04/08/2022    9:56 AM  Readmission Risk Prevention Plan  Transportation Screening Complete Complete Complete  PCP or Specialist Appt within 3-5 Days Complete Complete   HRI or Home Care Consult Complete Complete   Social Work Consult for Recovery Care Planning/Counseling Complete Complete   Palliative Care Screening Complete Complete   Medication Review Oceanographer) Complete Complete Complete  PCP or Specialist appointment within 3-5 days of discharge   Complete  HRI or Home Care Consult   Complete  SW Recovery Care/Counseling Consult   Complete  Skilled Nursing Facility   Not Applicable

## 2023-07-21 DIAGNOSIS — T83021A Displacement of indwelling urethral catheter, initial encounter: Secondary | ICD-10-CM | POA: Diagnosis not present

## 2023-07-21 LAB — BASIC METABOLIC PANEL
Anion gap: 8 (ref 5–15)
BUN: 32 mg/dL — ABNORMAL HIGH (ref 8–23)
CO2: 21 mmol/L — ABNORMAL LOW (ref 22–32)
Calcium: 8.3 mg/dL — ABNORMAL LOW (ref 8.9–10.3)
Chloride: 109 mmol/L (ref 98–111)
Creatinine, Ser: 1.61 mg/dL — ABNORMAL HIGH (ref 0.61–1.24)
GFR, Estimated: 43 mL/min — ABNORMAL LOW (ref >60)
Glucose, Bld: 96 mg/dL (ref 70–99)
Potassium: 3.8 mmol/L (ref 3.5–5.1)
Sodium: 138 mmol/L (ref 135–145)

## 2023-07-21 LAB — CBC
HCT: 23.2 % — ABNORMAL LOW (ref 39.0–52.0)
Hemoglobin: 7.3 g/dL — ABNORMAL LOW (ref 13.0–17.0)
MCH: 27 pg (ref 26.0–34.0)
MCHC: 31.5 g/dL (ref 30.0–36.0)
MCV: 85.9 fL (ref 80.0–100.0)
Platelets: 115 10*3/uL — ABNORMAL LOW (ref 150–400)
RBC: 2.7 MIL/uL — ABNORMAL LOW (ref 4.22–5.81)
RDW: 13.7 % (ref 11.5–15.5)
WBC: 6.1 10*3/uL (ref 4.0–10.5)
nRBC: 0 % (ref 0.0–0.2)

## 2023-07-21 LAB — PREPARE RBC (CROSSMATCH)

## 2023-07-21 LAB — MAGNESIUM: Magnesium: 1.7 mg/dL (ref 1.7–2.4)

## 2023-07-21 MED ORDER — SODIUM CHLORIDE 0.9% IV SOLUTION: Freq: Once | INTRAVENOUS | Status: AC

## 2023-07-21 NOTE — Progress Notes (Signed)
Reviewed chart Surprised Hb down again Belly benign and flat  No pain Urine clear No evidence of bleeding Observe for 24 hours - ? Leave to primary team to transfuse If  hb decreased more will order CT scan - looks very benign Will follow

## 2023-07-21 NOTE — [place] FL2 (Signed)
[place] MEDICAID FL2 LEVEL OF CARE FORM     IDENTIFICATION  Patient Name: Theodore King Birthdate: 10/27/1945 Sex: male Admission Date (Current Location): 07/16/2023  [facility] and [place] Number:  Producer, television/film/video and Address:  [facility],  [address] [place], [place] [address]      Provider Number: [id number]  Attending Physician Name and Address:  Miguel Rota, MD  Relative Name and Phone Number:  daughter, Demtrius Rounds @ [phone]    Current Level of Care: Hospital Recommended Level of Care: Skilled Nursing Facility Prior Approval Number:    Date Approved/Denied:   PASRR Number: [id number]  Discharge Plan: SNF    Current Diagnoses: Patient Active Problem List   Diagnosis Date Noted   Chronic kidney disease, stage 3b (HCC) 07/17/2023   Anemia of chronic disease 07/17/2023   Displacement of Foley catheter, initial encounter (HCC) 07/16/2023   Atelectasis 01/18/2023   Chronic indwelling Foley catheter    Sepsis (HCC) 04/04/2022   Pressure injury of skin 02/03/2022   Urinary retention 02/01/2022   Foley catheter problem, initial encounter (HCC) 01/31/2022   Pulmonary nodule 01/31/2022   Syncope 11/18/2021   UTI (urinary tract infection) 11/12/2021   Essential hypertension 11/11/2021   Bacteremia 11/10/2021   Hypokalemia 11/09/2021   Normocytic anemia 11/09/2021   Benign neoplasm of transverse colon    Abnormal CT of the abdomen    Acute renal failure superimposed on stage 3a chronic kidney disease (HCC) 10/23/2021   Thrombocytopenia (HCC) 10/23/2021   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 10/23/2021   Hyperbilirubinemia 10/23/2021   Colonic mass 10/23/2021   Aortic atherosclerosis (HCC) 10/23/2021   History of 2019 novel coronavirus disease (COVID-19) 04/24/2019   Dysphonia 10/16/2018   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   Former smoker 08/15/2018   SOB (shortness of breath)  03/23/2018   Vertigo, benign paroxysmal 11/05/2016   Abnormal dreams 09/06/2013   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   Hyperglycemia 05/18/2013   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) 05/18/2013   Vitamin D deficiency 05/18/2013   Hyperlipidemia 05/18/2013   Parkinson's disease (HCC) 10/26/2012    Orientation RESPIRATION BLADDER Height & Weight     Self, Time, Situation, Place  Normal  (supra pubic catheter) Weight:   Height:     BEHAVIORAL SYMPTOMS/MOOD NEUROLOGICAL BOWEL NUTRITION STATUS      Incontinent Diet  AMBULATORY STATUS COMMUNICATION OF NEEDS Skin   Extensive Assist Verbally Normal                       Personal Care Assistance Level of Assistance  Bathing, Dressing Bathing Assistance: Limited assistance   Dressing Assistance: Limited assistance     Functional Limitations Info  Sight, Hearing, Speech Sight Info: Adequate Hearing Info: Impaired Speech Info: Adequate    SPECIAL CARE FACTORS FREQUENCY  PT (By licensed PT)     PT Frequency: 2x/wk              Contractures Contractures Info: Not present    Additional Factors Info  Code Status, Allergies Code Status Info: full Allergies Info: NKDA           Current Medications (07/21/2023):  This is the current hospital active medication list Current Facility-Administered Medications  Medication Dose Route Frequency Provider Last Rate Last Admin   0.9 %  sodium chloride infusion (Manually program via Guardrails IV Fluids)   Intravenous Once Amin, Ankit  C, MD       acetaminophen (TYLENOL) tablet 650 mg  650 mg Oral Q6H PRN Steffanie Rainwater, MD   650 mg at 07/16/23 2052   Or   acetaminophen (TYLENOL) suppository 650 mg  650 mg Rectal Q6H PRN Steffanie Rainwater, MD       amLODipine (NORVASC) tablet 10 mg  10 mg Oral Daily Shalhoub, Deno Lunger, MD   10 mg at 07/21/23 0811   carbidopa-levodopa (SINEMET CR) 50-200 MG per tablet controlled release 1 tablet  1  tablet Oral QHS Shalhoub, Deno Lunger, MD   1 tablet at 07/20/23 2214   carbidopa-levodopa (SINEMET IR) 25-100 MG per tablet immediate release 1 tablet  1 tablet Oral Q2000 Steffanie Rainwater, MD   1 tablet at 07/20/23 2223   carbidopa-levodopa (SINEMET IR) 25-100 MG per tablet immediate release 2 tablet  2 tablet Oral BID Steffanie Rainwater, MD   2 tablet at 07/21/23 0811   enoxaparin (LOVENOX) injection 30 mg  30 mg Subcutaneous QHS Danford Bad, RPH   30 mg at 07/20/23 2214   guaiFENesin (ROBITUSSIN) 100 MG/5ML liquid 5 mL  5 mL Oral Q4H PRN Amin, Ankit C, MD       hydrALAZINE (APRESOLINE) injection 10 mg  10 mg Intravenous Q4H PRN Amin, Ankit C, MD       ipratropium-albuterol (DUONEB) 0.5-2.5 (3) MG/3ML nebulizer solution 3 mL  3 mL Nebulization Q4H PRN Amin, Ankit C, MD       metoprolol tartrate (LOPRESSOR) injection 5 mg  5 mg Intravenous Q4H PRN Amin, Ankit C, MD       ondansetron (ZOFRAN) tablet 4 mg  4 mg Oral Q6H PRN Steffanie Rainwater, MD       Or   ondansetron (ZOFRAN) injection 4 mg  4 mg Intravenous Q6H PRN Steffanie Rainwater, MD       oxyCODONE (Oxy IR/ROXICODONE) immediate release tablet 5 mg  5 mg Oral Q6H PRN Steffanie Rainwater, MD       senna-docusate (Senokot-S) tablet 1 tablet  1 tablet Oral QHS Steffanie Rainwater, MD   1 tablet at 07/20/23 2214   traZODone (DESYREL) tablet 50 mg  50 mg Oral QHS Steffanie Rainwater, MD   50 mg at 07/20/23 2214     Discharge Medications: Please see discharge summary for a list of discharge medications.  Relevant Imaging Results:  Relevant Lab Results:   Additional Information SSN: [id number]  Amada Jupiter, LCSW

## 2023-07-21 NOTE — Progress Notes (Signed)
Spoke with nurse and clinically doing well Gave my phone number to speak with brother

## 2023-07-21 NOTE — Progress Notes (Signed)
PROGRESS NOTE    Theodore King  ZHY:[id number] DOB: [birth year] DOA: 07/16/2023 PCP: [facility]    Brief Narrative:  78 y.o. male with medical history significant for dementia, Parkinson's disease, HTN, HLD, CKD 3B, urinary retention and BPH s/p chronic indwelling urinary catheter who present to [facility] hospital ED  from SNF after his urinary catheter was accidentally removed.     There has been significant difficulty with inserting a urethral catheter and therefore upon conversing with urology and interventional radiology plan is for patient to undergo suprapubic catheter placement on 2/10.    Postprocedure had anemia requiring PRBC transfusion.  Due to dehydration also received IV fluids.  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  Difficult to place Foley by urology due to stricture.  Eventually CT-guided supra catheter placed 2/10, 74F.  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  Gentle hydration.  Anemia of chronic disease Hemoglobin around baseline of 10, slowly dropped down to 7.3.  1 unit PRBC transfusion 2/12, will give another unit today.  If further continues to drop will get CT abdomen pelvis.  I think this is mostly hemodilution   Essential hypertension Continue Norvasc.  IV as needed  Dementia due to  Parkinson disease without behavioral disturbance (HCC) Supportive care.  Continue Sinemet  DVT prophylaxis: Lovenox    Code Status: Full Code Family Communication:  Called [name].  Status is: Inpatient Remains inpatient appropriate because:  Discharge back to long-term care facility hopefully tomorrow   Subjective: Feeling okay no complaints.  Examination:  General exam: Appears calm and comfortable  Respiratory system: Clear to auscultation. Respiratory effort normal. Cardiovascular system: S1 & S2 heard, RRR. No JVD, murmurs, rubs, gallops or clicks. No pedal edema. Gastrointestinal system: Abdomen is nondistended, soft and nontender. No organomegaly or masses felt. Normal bowel sounds heard. Central nervous system: Alert and oriented. No focal neurological deficits. Extremities: Symmetric 5 x 5 power. Skin: No rashes, lesions or ulcers Psychiatry: Judgement and insight appear poor Super pubic catheter in place               Diet Orders (From admission, onward)     Start     Ordered   07/18/23 1639  Diet regular Room service appropriate? Yes; Fluid consistency: Thin  Diet effective now       Question Answer Comment  Room service appropriate? Yes   Fluid consistency: Thin      07/18/23 1638            Objective: Vitals:   07/20/23 2059 07/21/23 0557 07/21/23 1051 07/21/23 1111  BP: (!) 117/43 (!) 129/56 (!) 123/54 (!) 146/57  Pulse: (!) 58 (!) 57 (!) 58 61  Resp: 17 15 16 16   Temp: 98.9 F (37.2 C) 99.1 F (37.3 C) 97.6 F (36.4 C) 98.5 F (36.9 C)  TempSrc: Oral  Oral Oral  SpO2: 100% 99%  100% 100%    Intake/Output Summary (Last 24 hours) at 07/21/2023 1217 Last data filed at 07/21/2023 1031 Gross per 24 hour  Intake 1155 ml  Output 1750 ml  Net -595 ml   There were no vitals filed for this visit.  Scheduled Meds:  amLODipine  10 mg Oral Daily   carbidopa-levodopa  1 tablet Oral QHS   carbidopa-levodopa  1 tablet Oral Q2000    carbidopa-levodopa  2 tablet Oral BID   enoxaparin (LOVENOX) injection  30 mg Subcutaneous QHS   senna-docusate  1 tablet Oral QHS   traZODone  50 mg Oral QHS   Continuous Infusions:  Nutritional status     There is no height or weight on file to calculate BMI.  Data Reviewed:   CBC: Recent Labs  Lab 07/16/23 1334 07/17/23 0538 07/18/23 0445 07/19/23 0459 07/20/23 0519 07/20/23 1729 07/21/23 0453  WBC 6.7 10.3 5.7 6.1 6.5  --  6.1  NEUTROABS 4.3  --  3.7  --   --   --   --   HGB 11.3* 10.2* 10.4* 8.9* 7.3* 8.1* 7.3*  HCT 37.0* 32.1* 33.5* 27.8* 23.0* 26.1* 23.2*  MCV 86.7 85.8 87.0 86.1 85.2  --  85.9  PLT 175 138* 142* 129* 123*  --  115*   Basic Metabolic Panel: Recent Labs  Lab 07/17/23 0538 07/18/23 0445 07/19/23 0459 07/20/23 0519 07/20/23 1157 07/21/23 0453  NA 141 138 137 139  --  138  K 4.4 4.6 4.0 4.0  --  3.8  CL 110 108 104 106  --  109  CO2 22 21* 22 23  --  21*  GLUCOSE 106* 79 103* 102*  --  96  BUN 33* 33* 36* 41*  --  32*  CREATININE 1.99* 1.84* 1.89* 2.45* 2.12* 1.61*  CALCIUM 9.0 8.9 8.9 8.5*  --  8.3*  MG  --  1.9 1.7 1.9  --  1.7  PHOS  --   --  3.0  --   --   --    GFR: CrCl cannot be calculated (Unknown ideal weight.). Liver Function Tests: Recent Labs  Lab 07/18/23 0445  AST 22  ALT <5  ALKPHOS 63  BILITOT 2.2*  PROT 7.1  ALBUMIN 3.1*   No results for input(s): "LIPASE", "AMYLASE" in the last 168 hours. No results for input(s): "AMMONIA" in the last 168 hours. Coagulation Profile: Recent Labs  Lab 07/17/23 0812  INR 1.2   Cardiac Enzymes: No results for input(s): "CKTOTAL", "CKMB", "CKMBINDEX", "TROPONINI" in the last 168 hours. BNP (last 3 results) No results for input(s): "PROBNP" in the last 8760 hours. HbA1C: No results for input(s): "HGBA1C" in the last 72 hours. CBG: No results for input(s): "GLUCAP" in the last 168 hours. Lipid Profile: No results for input(s): "CHOL", "HDL", "LDLCALC", "TRIG", "CHOLHDL",  "LDLDIRECT" in the last 72 hours. Thyroid Function Tests: No results for input(s): "TSH", "T4TOTAL", "FREET4", "T3FREE", "THYROIDAB" in the last 72 hours. Anemia Panel: No results for input(s): "VITAMINB12", "FOLATE", "FERRITIN", "TIBC", "IRON", "RETICCTPCT" in the last 72 hours. Sepsis Labs: No results for input(s): "PROCALCITON", "LATICACIDVEN" in the last 168 hours.  No results found for this or any previous visit (from the past 240 hours).       Radiology Studies: No results found.         LOS: 5 days   Time spent= 35 mins    Miguel Rota, MD [facility]  If 7PM-7AM, please contact night-coverage  07/21/2023,  12:17 PM

## 2023-07-21 NOTE — Progress Notes (Signed)
Mobility Specialist - Progress Note   07/21/23 1000  Mobility  Activity Stood at bedside  Level of Assistance Moderate assist, patient does 50-74%  Assistive Device Front wheel walker  Activity Response Tolerated well  Mobility Referral No  Mobility visit 1 Mobility  Mobility Specialist Start Time (ACUTE ONLY) [id number]  Mobility Specialist Stop Time (ACUTE ONLY) 0959  Mobility Specialist Time Calculation (min) (ACUTE ONLY) 15 min   Pt received in bed and agreeable to mobility. Pt was minA from supine>sitting, needing assistance to scoot EOB. Pt was modA from STS. Pt tolerated x1 STS. Pt found to be soiled, NT made aware. No complaints during session. Pt to bed after session with all needs met. RN & NT in room.    [facility]

## 2023-07-22 DIAGNOSIS — T83021A Displacement of indwelling urethral catheter, initial encounter: Secondary | ICD-10-CM | POA: Diagnosis not present

## 2023-07-22 LAB — BASIC METABOLIC PANEL
Anion gap: 11 (ref 5–15)
BUN: 25 mg/dL — ABNORMAL HIGH (ref 8–23)
CO2: 22 mmol/L (ref 22–32)
Calcium: 8.7 mg/dL — ABNORMAL LOW (ref 8.9–10.3)
Chloride: 107 mmol/L (ref 98–111)
Creatinine, Ser: 1.83 mg/dL — ABNORMAL HIGH (ref 0.61–1.24)
GFR, Estimated: 37 mL/min — ABNORMAL LOW (ref >60)
Glucose, Bld: 82 mg/dL (ref 70–99)
Potassium: 3.8 mmol/L (ref 3.5–5.1)
Sodium: 140 mmol/L (ref 135–145)

## 2023-07-22 LAB — TYPE AND SCREEN
ABO/RH(D): O POS
Antibody Screen: NEGATIVE
Unit division: 0
Unit division: 0

## 2023-07-22 LAB — CBC
HCT: 24.8 % — ABNORMAL LOW (ref 39.0–52.0)
Hemoglobin: 8 g/dL — ABNORMAL LOW (ref 13.0–17.0)
MCH: 27.4 pg (ref 26.0–34.0)
MCHC: 32.3 g/dL (ref 30.0–36.0)
MCV: 84.9 fL (ref 80.0–100.0)
Platelets: 123 10*3/uL — ABNORMAL LOW (ref 150–400)
RBC: 2.92 MIL/uL — ABNORMAL LOW (ref 4.22–5.81)
RDW: 13.8 % (ref 11.5–15.5)
WBC: 6.3 10*3/uL (ref 4.0–10.5)
nRBC: 0 % (ref 0.0–0.2)

## 2023-07-22 LAB — BPAM RBC
Blood Product Expiration Date: 202503142359
Blood Product Expiration Date: 202503152359
ISSUE DATE / TIME: 202502121139
ISSUE DATE / TIME: 202502131037
Unit Type and Rh: 5100
Unit Type and Rh: 5100

## 2023-07-22 LAB — MAGNESIUM: Magnesium: 1.8 mg/dL (ref 1.7–2.4)

## 2023-07-22 MED ORDER — SODIUM CHLORIDE 0.9 % IV BOLUS
500.0000 mL | Freq: Once | INTRAVENOUS | Status: AC
  Administered 2023-07-22: 500 mL via INTRAVENOUS

## 2023-07-22 NOTE — Progress Notes (Signed)
Report given  to nurse at [facility]. Pt discharged via wc to front entrance via wc to meet [facility] transport van. Accompanied by NT and nurse.

## 2023-07-22 NOTE — Progress Notes (Signed)
Patient took off Suprapubic catheter dressing. Bathed and dressing was replaced.

## 2023-07-22 NOTE — Discharge Summary (Signed)
Physician Discharge Summary  Theodore King WJX:[id number] DOB: 08/15/1945 DOA: 07/16/2023  PCP: [facility]  Admit date: 07/16/2023 Discharge date: 07/22/2023  Admitted From: SNF Disposition:  SNF  Recommendations for Outpatient Follow-up:  Follow up with PCP in 1-2 weeks Please obtain BMP/CBC in one week your next doctors visit.  Outptn IR and Urology  follow up   Discharge Condition: Stable CODE STATUS: Full Diet recommendation: Regular  Brief/Interim Summary: Brief Narrative:  78 y.o. male with medical history significant for dementia, Parkinson's disease, HTN, HLD, CKD 3B, urinary retention and BPH s/p chronic indwelling urinary catheter who present to [facility] hospital ED  from SNF after his urinary catheter was accidentally removed.    Catheter accidentally removed at SNF.  Difficult to place Foley by urology due to stricture.  Eventually CT-guided supra catheter placed 2/10, 56F.  Follow up outpatient with IR and urology.   Hemoglobin around baseline of 10, slowly dropped down to 7.3.  1 unit PRBC transfusion 2/12, will give another unit today.  Hb is now stable around 8, without any signs of bleeding.   Medically stable for dc today.   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  Difficult to place Foley by urology due to stricture.  Eventually CT-guided supra catheter placed 2/10, 56F.  Follow up outpatient with IR and urology.   Benign localized hyperplasia of prostate with urinary  retention Please see assessment and plan above  AKI on chronic kidney disease, stage 3b (HCC) Creatinine around baseline of 1.8, creatinine peaked at 2.45.  Gentle hydration.  Anemia of chronic disease Hemoglobin around baseline of 10, slowly dropped down to 7.3.  1 unit PRBC transfusion 2/12, will give another unit today.  Hb is now stable around 8, without any signs of bleeding.    Essential hypertension Continue Norvasc.  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  Continue Sinemet  DVT prophylaxis: Lovenox    Code Status: Full Code Family Communication:  Called [name].  Status is: Inpatient Remains inpatient appropriate because:  Discharge back to long-term care facility hopefully tomorrow   Subjective: Feeling okay no complaints. No signs of bleeding.   Examination:  General exam: Appears calm and comfortable  Respiratory system: Clear to auscultation. Respiratory effort normal. Cardiovascular system: S1 & S2 heard, RRR. No JVD, murmurs, rubs, gallops or clicks. No pedal edema. Gastrointestinal system: Abdomen is nondistended, soft and nontender. No organomegaly or masses felt. Normal bowel sounds heard. Central nervous system: Alert and oriented. No focal neurological deficits. Extremities: Symmetric 5 x 5 power. 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   Essential hypertension   Urinary retention   Chronic kidney disease, stage 3b (HCC)  Anemia of chronic disease      Discharge Exam: Vitals:   07/22/23 0646 07/22/23 0837  BP: (!) 136/52 (!) 136/52   Pulse: (!) 56   Resp: 18   Temp: 97.7 F (36.5 C)   SpO2: 100%    Vitals:   07/21/23 1326 07/21/23 2028 07/22/23 0646 07/22/23 0837  BP: (!) 146/56 (!) 137/51 (!) 136/52 (!) 136/52  Pulse: 61 (!) 58 (!) 56   Resp: 18 18 18    Temp: (!) 97.5 F (36.4 C) 98.8 F (37.1 C) 97.7 F (36.5 C)   TempSrc: Oral Oral Oral   SpO2: 100% 100% 100%       Discharge Instructions   Allergies as of 07/22/2023   No Known Allergies      Medication List     STOP taking these medications    haloperidol lactate 5 MG/ML injection Commonly known as: HALDOL       TAKE these medications    acetaminophen 500 MG tablet Commonly known as: TYLENOL Take 1,000 mg by mouth 2 (two) times daily.   albuterol 108 (90 Base) MCG/ACT inhaler Commonly known as: VENTOLIN HFA Inhale 2 puffs into the lungs every 6 (six) hours as needed for wheezing.   amLODipine 10 MG tablet Commonly known as: NORVASC Take 1 tablet (10 mg total) by mouth daily.   bisacodyl 10 MG suppository Commonly known as: DULCOLAX Place 10 mg rectally daily as needed (constipation not relieved by Milk of Magnesia).   carbidopa-levodopa 50-200 MG tablet Commonly known as: SINEMET CR Take 1 tablet by mouth at bedtime. What changed: Another medication with the same name was changed. Make sure you understand how and when to take each.   carbidopa-levodopa 25-100 MG tablet Commonly known as: SINEMET IR Take 2 tablets at 7 AM, 2 tablets at 10 AM, 2 tablets at 2 PM and 1 tablet at 6 PM What changed:  how much to take how to take this when to take this additional instructions   ENEMA RE Place 1 enema rectally daily as needed (constipation not relieved by bisacodyl suppository).   glycopyrrolate 1 MG tablet Commonly known as: Robinul Take 1 tablet (1 mg total) by mouth 2 (two) times daily.   GoodSense Milk of Magnesia 1200 MG/15ML suspension Generic drug: magnesium hydroxide Take 30 mLs by mouth daily as needed (if no  B/M after 3 days).   Lubriderm Advanced Therapy Lotn Apply 1 application  topically See admin instructions. Apply to all extremities every day shift and every 6 hours as needed for dryness   nystatin powder Commonly known as: MYCOSTATIN/NYSTOP Apply 1 Application topically every 8 (eight) hours as needed (moisture-associated dermatitis). Apply to groin area and skin folds.   OVER THE COUNTER MEDICATION Take 120 mLs by mouth See admin instructions. MedPass- Drink 120 ml's by mouth once a day if Ensure is not available   oxyCODONE 5 MG immediate release tablet Commonly known as: Oxy IR/ROXICODONE Take 1 tablet (5 mg total) by mouth every 6 (six) hours as needed for severe pain (pain score 7-10).   senna-docusate 8.6-50 MG tablet Commonly known as: Senokot-S Take 1 tablet by mouth at bedtime as needed for mild constipation or moderate constipation.   traZODone 50 MG tablet Commonly known as: DESYREL Take 50 mg by mouth at bedtime.   Vitamin D (Ergocalciferol) 1.25 MG (50000 UNIT) Caps capsule Commonly known as: DRISDOL Take 50,000 Units by mouth See admin instructions. Take 50,000 units by mouth on the 3rd of  every month        No Known Allergies  You were cared for by a hospitalist during your hospital stay. If you have any questions about your discharge medications or the care you received while you were in the hospital after you are discharged, you can call the unit and asked to speak with the hospitalist on call if the hospitalist that took care of you is not available. Once you are discharged, your primary care physician will handle any further medical issues. Please note that no refills for any discharge medications will be authorized once you are discharged, as it is imperative that you return to your primary care physician (or establish a relationship with a primary care physician if you do not have one) for your aftercare needs so that they can reassess your need for  medications and monitor your lab values.  You were cared for by a hospitalist during your hospital stay. If you have any questions about your discharge medications or the care you received while you were in the hospital after you are discharged, you can call the unit and asked to speak with the hospitalist on call if the hospitalist that took care of you is not available. Once you are discharged, your primary care physician will handle any further medical issues. Please note that NO REFILLS for any discharge medications will be authorized once you are discharged, as it is imperative that you return to your primary care physician (or establish a relationship with a primary care physician if you do not have one) for your aftercare needs so that they can reassess your need for medications and monitor your lab values.  Please request your Prim.MD to go over all Hospital Tests and Procedure/Radiological results at the follow up, please get all Hospital records sent to your Prim MD by signing hospital release before you go home.  Get CBC, CMP, 2 view Chest X ray checked  by Primary MD during your next visit or SNF MD in 5-7 days ( we routinely change or add medications that can affect your baseline labs and fluid status, therefore we recommend that you get the mentioned basic workup next visit with your PCP, your PCP may decide not to get them or add new tests based on their clinical decision)  On your next visit with your primary care physician please Get Medicines reviewed and adjusted.  If you experience worsening of your admission symptoms, develop shortness of breath, life threatening emergency, suicidal or homicidal thoughts you must seek medical attention immediately by calling 911 or calling your MD immediately  if symptoms less severe.  You Must read complete instructions/literature along with all the possible adverse reactions/side effects for all the Medicines you take and that have been prescribed  to you. Take any new Medicines after you have completely understood and accpet all the possible adverse reactions/side effects.   Do not drive, operate heavy machinery, perform activities at heights, swimming or participation in water activities or provide baby sitting services if your were admitted for syncope or siezures until you have seen by Primary MD or a Neurologist and advised to do so again.  Do not drive when taking Pain medications.   Procedures/Studies: CT PELVIS LIMITED WO CONTRAST Result Date: 07/18/2023 CLINICAL DATA:  BPH, history of chronic Foley catheter, urethral erosion EXAM: CT GUIDED SUPRAPUBIC CATHETER PLACEMENT COMPARISON:  01/18/2023 ANESTHESIA/SEDATION: Intravenous Fentanyl and Versed 1mg  were administered by RN during a total moderate (conscious) sedation time of 30 minutes;  the patient's level of consciousness and physiological / cardiorespiratory status were monitored continuously by radiology RN under my direct supervision. PROCEDURE: Informed written consent was obtained from the patient after a thorough discussion of the procedural risks, benefits and alternatives. All questions were addressed. Maximal Sterile Barrier Technique was utilized including caps, mask, sterile gowns, sterile gloves, sterile drape, hand hygiene and skin antiseptic. A timeout was performed prior to the initiation of the procedure. Select axial CT scans were obtained through the urinary bladder. No Foley catheter was present. Patient was returned before, but after discussion with the floor, Foley catheter could not be placed even with a urology consultation. Therefore, patient returned to the CT gantry and localized. An appropriate skin entry site was determined and marked. Skin site was prepped with chlorhexidine, draped in usual sterile fashion, infiltrated locally with 1% lidocaine. Under CT fluoroscopic guidance, a 7 cm 5 French multi sidehole Yueh sheath needle was advanced into the urinary  bladder from a suprapubic midline approach. Urine returned through the needle hub. The bladder was distended with 300 mL sterile saline. Amplatz guidewire advanced easily through the [place], position confirmed on CT. Tract dilated to facilitate placement of a 14 French pigtail catheter, formed within the lumen of the urinary bladder. Position confirmed on CT. Catheter secured externally with 0 Prolene suture and placed to Foley drain bag. The patient tolerated the procedure well. COMPLICATIONS: None immediate. IMPRESSION: 1. Technically successful CT-guided suprapubic catheter placement. Electronically Signed   By: Corlis Leak M.D.   On: 07/18/2023 16:32   CT GUIDED SUPERPUBIC CATHETER PLMT Result Date: 07/18/2023 CLINICAL DATA:  BPH, history of chronic Foley catheter, urethral erosion EXAM: CT GUIDED SUPRAPUBIC CATHETER PLACEMENT COMPARISON:  01/18/2023 ANESTHESIA/SEDATION: Intravenous Fentanyl and Versed 1mg  were administered by RN during a total moderate (conscious) sedation time of 30 minutes; the patient's level of consciousness and physiological / cardiorespiratory status were monitored continuously by radiology RN under my direct supervision. PROCEDURE: Informed written consent was obtained from the patient after a thorough discussion of the procedural risks, benefits and alternatives. All questions were addressed. Maximal Sterile Barrier Technique was utilized including caps, mask, sterile gowns, sterile gloves, sterile drape, hand hygiene and skin antiseptic. A timeout was performed prior to the initiation of the procedure. Select axial CT scans were obtained through the urinary bladder. No Foley catheter was present. Patient was returned before, but after discussion with the floor, Foley catheter could not be placed even with a urology consultation. Therefore, patient returned to the CT gantry and localized. An appropriate skin entry site was determined and marked. Skin site was prepped with  chlorhexidine, draped in usual sterile fashion, infiltrated locally with 1% lidocaine. Under CT fluoroscopic guidance, a 7 cm 5 French multi sidehole Yueh sheath needle was advanced into the urinary bladder from a suprapubic midline approach. Urine returned through the needle hub. The bladder was distended with 300 mL sterile saline. Amplatz guidewire advanced easily through the [place], position confirmed on CT. Tract dilated to facilitate placement of a 14 French pigtail catheter, formed within the lumen of the urinary bladder. Position confirmed on CT. Catheter secured externally with 0 Prolene suture and placed to Foley drain bag. The patient tolerated the procedure well. COMPLICATIONS: None immediate. IMPRESSION: 1. Technically successful CT-guided suprapubic catheter placement. Electronically Signed   By: Corlis Leak M.D.   On: 07/18/2023 16:32     The results of significant diagnostics from this hospitalization (including imaging, microbiology, ancillary and laboratory) are listed below  for reference.     Microbiology: No results found for this or any previous visit (from the past 240 hours).   Labs: BNP (last 3 results) No results for input(s): "BNP" in the last 8760 hours. Basic Metabolic Panel: Recent Labs  Lab 07/18/23 0445 07/19/23 0459 07/20/23 0519 07/20/23 1157 07/21/23 0453 07/22/23 0459  NA 138 137 139  --  138 140  K 4.6 4.0 4.0  --  3.8 3.8  CL 108 104 106  --  109 107  CO2 21* 22 23  --  21* 22  GLUCOSE 79 103* 102*  --  96 82  BUN 33* 36* 41*  --  32* 25*  CREATININE 1.84* 1.89* 2.45* 2.12* 1.61* 1.83*  CALCIUM 8.9 8.9 8.5*  --  8.3* 8.7*  MG 1.9 1.7 1.9  --  1.7 1.8  PHOS  --  3.0  --   --   --   --    Liver Function Tests: Recent Labs  Lab 07/18/23 0445  AST 22  ALT <5  ALKPHOS 63  BILITOT 2.2*  PROT 7.1  ALBUMIN 3.1*   No results for input(s): "LIPASE", "AMYLASE" in the last 168 hours. No results for input(s): "AMMONIA" in the last 168  hours. CBC: Recent Labs  Lab 07/16/23 1334 07/17/23 0538 07/18/23 0445 07/19/23 0459 07/20/23 0519 07/20/23 1729 07/21/23 0453 07/22/23 0459  WBC 6.7   < > 5.7 6.1 6.5  --  6.1 6.3  NEUTROABS 4.3  --  3.7  --   --   --   --   --   HGB 11.3*   < > 10.4* 8.9* 7.3* 8.1* 7.3* 8.0*  HCT 37.0*   < > 33.5* 27.8* 23.0* 26.1* 23.2* 24.8*  MCV 86.7   < > 87.0 86.1 85.2  --  85.9 84.9  PLT 175   < > 142* 129* 123*  --  115* 123*   < > = values in this interval not displayed.   Cardiac Enzymes: No results for input(s): "CKTOTAL", "CKMB", "CKMBINDEX", "TROPONINI" in the last 168 hours. BNP: Invalid input(s): "POCBNP" CBG: No results for input(s): "GLUCAP" in the last 168 hours. D-Dimer No results for input(s): "DDIMER" in the last 72 hours. Hgb A1c No results for input(s): "HGBA1C" in the last 72 hours. Lipid Profile No results for input(s): "CHOL", "HDL", "LDLCALC", "TRIG", "CHOLHDL", "LDLDIRECT" in the last 72 hours. Thyroid function studies No results for input(s): "TSH", "T4TOTAL", "T3FREE", "THYROIDAB" in the last 72 hours.  Invalid input(s): "FREET3" Anemia work up No results for input(s): "VITAMINB12", "FOLATE", "FERRITIN", "TIBC", "IRON", "RETICCTPCT" in the last 72 hours. Urinalysis    Component Value Date/Time   COLORURINE YELLOW 01/24/2023 1719   APPEARANCEUR CLEAR 01/24/2023 1719   LABSPEC 1.011 01/24/2023 1719   PHURINE 6.0 01/24/2023 1719   GLUCOSEU NEGATIVE 01/24/2023 1719   HGBUR SMALL (A) 01/24/2023 1719   BILIRUBINUR NEGATIVE 01/24/2023 1719   KETONESUR NEGATIVE 01/24/2023 1719   PROTEINUR NEGATIVE 01/24/2023 1719   UROBILINOGEN 1.0 04/16/2020 2048   NITRITE NEGATIVE 01/24/2023 1719   LEUKOCYTESUR MODERATE (A) 01/24/2023 1719   Sepsis Labs Recent Labs  Lab 07/19/23 0459 07/20/23 0519 07/21/23 0453 07/22/23 0459  WBC 6.1 6.5 6.1 6.3   Microbiology No results found for this or any previous visit (from the past 240 hours).   Time coordinating  discharge:  I have spent 35 minutes face to face with the patient and on the ward discussing the patients care, assessment, plan and  disposition with other care givers. >50% of the time was devoted counseling the patient about the risks and benefits of treatment/Discharge disposition and coordinating care.   SIGNED:   Miguel Rota, MD  [facility] 07/22/2023, 10:54 AM   If 7PM-7AM, please contact night-coverage

## 2023-07-22 NOTE — TOC Transition Note (Signed)
Transition of Care [facility]) - Discharge Note   Patient Details  Name: Theodore King MRN: [id number] Date of Birth: [birth year]  [facility] CM/SW Contact:  Amada Jupiter, LCSW Phone Number: 07/22/2023, 11:56 AM   Clinical Narrative:     Pt is medically cleared for return to [facility] today. Pt and daughter aware/ agreeable.  Have alerted [facility] and they will provide their [name] for pt's wheelchair transport back to facility.  [facility] [name] to be here for pick up at 12:45pm.  RN to call report to [phone]. No further TOC needs.  Final next level of care: Long Term Nursing Home Barriers to Discharge: Barriers Resolved   Patient Goals and CMS Choice Patient states their goals for this hospitalization and ongoing recovery are:: return to SNF          Discharge Placement                       Discharge Plan and Services Additional resources added to the After Visit Summary for   In-house Referral: Clinical Social Work              DME Arranged: N/A DME Agency: NA                  Social Drivers of Health (SDOH) Interventions SDOH Screenings   Food Insecurity: No Food Insecurity (07/17/2023)  Housing: Unknown (07/17/2023)  Transportation Needs: No Transportation Needs (07/17/2023)  Utilities: Not At Risk (07/17/2023)  Social Connections: Unknown (07/17/2023)  Tobacco Use: Medium Risk (07/17/2023)     Readmission Risk Interventions    07/20/2023    3:48 PM 01/19/2023    2:05 PM 04/08/2022    9:56 AM  Readmission Risk Prevention Plan  Transportation Screening Complete Complete Complete  PCP or Specialist Appt within 3-5 Days Complete Complete   HRI or Home Care Consult Complete Complete   Social Work Consult for Recovery Care Planning/Counseling Complete Complete   Palliative Care Screening Complete Complete   Medication Review Oceanographer) Complete Complete Complete  PCP or Specialist appointment within 3-5 days of discharge   Complete  HRI or  Home Care Consult   Complete  SW Recovery Care/Counseling Consult   Complete  Skilled Nursing Facility   Not Applicable

## 2023-07-22 NOTE — Progress Notes (Signed)
     Subjective: Pt resting comfortably, sleeping Pt s/p additional 1u RBC on 2/13.  Appropriate response on AM labs Catheter draining Clear yellow urine  Objective: Vital signs in last 24 hours: Temp:  [97.5 F (36.4 C)-98.8 F (37.1 C)] 97.7 F (36.5 C) (02/14 0646) Pulse Rate:  [56-61] 56 (02/14 0646) Resp:  [16-18] 18 (02/14 0646) BP: (123-146)/(51-57) 136/52 (02/14 0646) SpO2:  [100 %] 100 % (02/14 0646)  Assessment/Plan: #enlarged prostate #urinary retention #chronic indwelling foley catheter #urethral erosion  Foley catheter was unable to be placed by nursing and Urology. Urethral stricture initially Hg now stable - believe downtrend over past couple of days more hemodilution POD4 from SPT. Recommend continued gauze and bandages around SPT during healing, ensure patency of foley catheter Do not believe CT imaging needed at this time Okay for discharge from urology standpoint   Intake/Output from previous day: 02/13 0701 - 02/14 0700 In: 968.8 [P.O.:600; Blood:368.8] Out: 2151 [Urine:2150; Stool:1]  Intake/Output this shift: Total I/O In: 360 [P.O.:360] Out: 1001 [Urine:1000; Stool:1]  Physical Exam:  General: Sleep CV: No cyanosis Lungs: equal chest rise   Lab Results: Recent Labs    07/20/23 1729 07/21/23 0453 07/22/23 0459  HGB 8.1* 7.3* 8.0*  HCT 26.1* 23.2* 24.8*   BMET Recent Labs    07/21/23 0453 07/22/23 0459  NA 138 140  K 3.8 3.8  CL 109 107  CO2 21* 22  GLUCOSE 96 82  BUN 32* 25*  CREATININE 1.61* 1.83*  CALCIUM 8.3* 8.7*     Studies/Results: No results found.     LOS: 6 days   Roby Lofts, MD Resident Physician [facility]   07/22/2023, 6:57 AM

## 2023-07-25 ENCOUNTER — Encounter (HOSPITAL_COMMUNITY): Payer: Self-pay | Admitting: Radiology

## 2023-08-14 ENCOUNTER — Emergency Department (HOSPITAL_COMMUNITY): Payer: Medicare (Managed Care)

## 2023-08-14 ENCOUNTER — Inpatient Hospital Stay (HOSPITAL_COMMUNITY)
Admission: EM | Admit: 2023-08-14 | Discharge: 2023-08-18 | DRG: 682 | Disposition: A | Discharge status: Skilled Nursing Facility | Payer: Medicare (Managed Care) | Source: Skilled Nursing Facility | Attending: Internal Medicine | Admitting: Internal Medicine

## 2023-08-14 ENCOUNTER — Encounter (HOSPITAL_COMMUNITY): Payer: Self-pay

## 2023-08-14 ENCOUNTER — Other Ambulatory Visit: Payer: Self-pay

## 2023-08-14 DIAGNOSIS — D509 Iron deficiency anemia, unspecified: Secondary | ICD-10-CM | POA: Diagnosis present

## 2023-08-14 DIAGNOSIS — D631 Anemia in chronic kidney disease: Secondary | ICD-10-CM | POA: Diagnosis present

## 2023-08-14 DIAGNOSIS — E869 Volume depletion, unspecified: Secondary | ICD-10-CM | POA: Diagnosis present

## 2023-08-14 DIAGNOSIS — G4700 Insomnia, unspecified: Secondary | ICD-10-CM | POA: Diagnosis present

## 2023-08-14 DIAGNOSIS — Z801 Family history of malignant neoplasm of trachea, bronchus and lung: Secondary | ICD-10-CM

## 2023-08-14 DIAGNOSIS — G9341 Metabolic encephalopathy: Secondary | ICD-10-CM | POA: Diagnosis present

## 2023-08-14 DIAGNOSIS — R410 Disorientation, unspecified: Secondary | ICD-10-CM

## 2023-08-14 DIAGNOSIS — Z87891 Personal history of nicotine dependence: Secondary | ICD-10-CM

## 2023-08-14 DIAGNOSIS — G20A1 Parkinson's disease without dyskinesia, without mention of fluctuations: Secondary | ICD-10-CM | POA: Diagnosis present

## 2023-08-14 DIAGNOSIS — I129 Hypertensive chronic kidney disease with stage 1 through stage 4 chronic kidney disease, or unspecified chronic kidney disease: Secondary | ICD-10-CM | POA: Diagnosis present

## 2023-08-14 DIAGNOSIS — Z823 Family history of stroke: Secondary | ICD-10-CM

## 2023-08-14 DIAGNOSIS — F02818 Dementia in other diseases classified elsewhere, unspecified severity, with other behavioral disturbance: Secondary | ICD-10-CM | POA: Diagnosis present

## 2023-08-14 DIAGNOSIS — N179 Acute kidney failure, unspecified: Principal | ICD-10-CM | POA: Diagnosis present

## 2023-08-14 DIAGNOSIS — R8271 Bacteriuria: Secondary | ICD-10-CM | POA: Diagnosis present

## 2023-08-14 DIAGNOSIS — I10 Essential (primary) hypertension: Secondary | ICD-10-CM | POA: Diagnosis present

## 2023-08-14 DIAGNOSIS — M4802 Spinal stenosis, cervical region: Secondary | ICD-10-CM | POA: Diagnosis present

## 2023-08-14 DIAGNOSIS — N400 Enlarged prostate without lower urinary tract symptoms: Secondary | ICD-10-CM | POA: Diagnosis present

## 2023-08-14 DIAGNOSIS — S01111A Laceration without foreign body of right eyelid and periocular area, initial encounter: Secondary | ICD-10-CM | POA: Diagnosis present

## 2023-08-14 DIAGNOSIS — F0280 Dementia in other diseases classified elsewhere without behavioral disturbance: Secondary | ICD-10-CM | POA: Diagnosis present

## 2023-08-14 DIAGNOSIS — I700 Atherosclerosis of aorta: Secondary | ICD-10-CM | POA: Diagnosis present

## 2023-08-14 DIAGNOSIS — Z860100 Personal history of colon polyps, unspecified: Secondary | ICD-10-CM

## 2023-08-14 DIAGNOSIS — N1832 Chronic kidney disease, stage 3b: Secondary | ICD-10-CM | POA: Diagnosis present

## 2023-08-14 DIAGNOSIS — W19XXXA Unspecified fall, initial encounter: Principal | ICD-10-CM | POA: Diagnosis present

## 2023-08-14 DIAGNOSIS — D638 Anemia in other chronic diseases classified elsewhere: Secondary | ICD-10-CM | POA: Diagnosis present

## 2023-08-14 DIAGNOSIS — Z9359 Other cystostomy status: Secondary | ICD-10-CM

## 2023-08-14 DIAGNOSIS — Z79899 Other long term (current) drug therapy: Secondary | ICD-10-CM

## 2023-08-14 DIAGNOSIS — Z809 Family history of malignant neoplasm, unspecified: Secondary | ICD-10-CM

## 2023-08-14 DIAGNOSIS — E785 Hyperlipidemia, unspecified: Secondary | ICD-10-CM | POA: Diagnosis present

## 2023-08-14 DIAGNOSIS — Z8616 Personal history of COVID-19: Secondary | ICD-10-CM

## 2023-08-14 DIAGNOSIS — N3000 Acute cystitis without hematuria: Secondary | ICD-10-CM

## 2023-08-14 DIAGNOSIS — N390 Urinary tract infection, site not specified: Secondary | ICD-10-CM

## 2023-08-14 DIAGNOSIS — K5900 Constipation, unspecified: Secondary | ICD-10-CM | POA: Diagnosis present

## 2023-08-14 LAB — URINALYSIS, W/ REFLEX TO CULTURE (INFECTION SUSPECTED)
Bilirubin Urine: NEGATIVE
Glucose, UA: NEGATIVE mg/dL
Hgb urine dipstick: NEGATIVE
Ketones, ur: NEGATIVE mg/dL
Nitrite: NEGATIVE
Protein, ur: 100 mg/dL — AB
Specific Gravity, Urine: 1.011 (ref 1.005–1.030)
pH: 9 — ABNORMAL HIGH (ref 5.0–8.0)

## 2023-08-14 LAB — COMPREHENSIVE METABOLIC PANEL
ALT: 5 U/L (ref 0–44)
AST: 13 U/L — ABNORMAL LOW (ref 15–41)
Albumin: 3.5 g/dL (ref 3.5–5.0)
Alkaline Phosphatase: 64 U/L (ref 38–126)
Anion gap: 8 (ref 5–15)
BUN: 28 mg/dL — ABNORMAL HIGH (ref 8–23)
CO2: 25 mmol/L (ref 22–32)
Calcium: 9.2 mg/dL (ref 8.9–10.3)
Chloride: 105 mmol/L (ref 98–111)
Creatinine, Ser: 2.07 mg/dL — ABNORMAL HIGH (ref 0.61–1.24)
GFR, Estimated: 32 mL/min — ABNORMAL LOW (ref >60)
Glucose, Bld: 100 mg/dL — ABNORMAL HIGH (ref 70–99)
Potassium: 4.4 mmol/L (ref 3.5–5.1)
Sodium: 138 mmol/L (ref 135–145)
Total Bilirubin: 1.6 mg/dL — ABNORMAL HIGH (ref 0.0–1.2)
Total Protein: 7.3 g/dL (ref 6.5–8.1)

## 2023-08-14 LAB — LIPASE, BLOOD: Lipase: 40 U/L (ref 11–51)

## 2023-08-14 LAB — CBC WITH DIFFERENTIAL/PLATELET
Abs Immature Granulocytes: 0.03 10*3/uL (ref 0.00–0.07)
Basophils Absolute: 0 10*3/uL (ref 0.0–0.1)
Basophils Relative: 0 %
Eosinophils Absolute: 0.1 10*3/uL (ref 0.0–0.5)
Eosinophils Relative: 2 %
HCT: 33.7 % — ABNORMAL LOW (ref 39.0–52.0)
Hemoglobin: 10.4 g/dL — ABNORMAL LOW (ref 13.0–17.0)
Immature Granulocytes: 0 %
Lymphocytes Relative: 18 %
Lymphs Abs: 1.4 10*3/uL (ref 0.7–4.0)
MCH: 27.2 pg (ref 26.0–34.0)
MCHC: 30.9 g/dL (ref 30.0–36.0)
MCV: 88 fL (ref 80.0–100.0)
Monocytes Absolute: 0.4 10*3/uL (ref 0.1–1.0)
Monocytes Relative: 5 %
Neutro Abs: 5.6 10*3/uL (ref 1.7–7.7)
Neutrophils Relative %: 75 %
Platelets: 174 10*3/uL (ref 150–400)
RBC: 3.83 MIL/uL — ABNORMAL LOW (ref 4.22–5.81)
RDW: 13.8 % (ref 11.5–15.5)
WBC: 7.5 10*3/uL (ref 4.0–10.5)
nRBC: 0 % (ref 0.0–0.2)

## 2023-08-14 MED ORDER — SODIUM CHLORIDE 0.9 % IV SOLN
1.0000 g | INTRAVENOUS | Status: DC
  Administered 2023-08-15 – 2023-08-16 (×2): 1 g via INTRAVENOUS
  Filled 2023-08-14 (×2): qty 10

## 2023-08-14 MED ORDER — SODIUM CHLORIDE 0.9 % IV SOLN
1.0000 g | Freq: Once | INTRAVENOUS | Status: AC
  Administered 2023-08-14: 1 g via INTRAVENOUS
  Filled 2023-08-14: qty 10

## 2023-08-14 MED ORDER — LORAZEPAM 1 MG PO TABS
0.5000 mg | ORAL_TABLET | Freq: Once | ORAL | Status: AC
  Administered 2023-08-14: 0.5 mg via ORAL
  Filled 2023-08-14: qty 1

## 2023-08-14 NOTE — ED Provider Triage Note (Signed)
 Emergency Medicine Provider Triage Evaluation Note  ALPHONZO DEVERA , a 78 y.o. male  was evaluated in triage.  Pt complains of fall.  Has a laceration to the right eyebrow.  C-collar in place by EMS..  Review of Systems  Positive: As above Negative: As above  Physical Exam  BP (!) 104/55 (BP Location: Right Arm)   Pulse 60   Temp 97.9 F (36.6 C) (Oral)   Resp 19   SpO2 100%  Gen:   Awake, no distress   Resp:  Normal effort  MSK:   Moves extremities without difficulty  Other:    Medical Decision Making  Medically screening exam initiated at 10:16 AM.  Appropriate orders placed.  KOA ZOELLER was informed that the remainder of the evaluation will be completed by another provider, this initial triage assessment does not replace that evaluation, and the importance of remaining in the ED until their evaluation is complete.  CT head and cervical spine ordered.   Marita Kansas, PA-C 08/14/23 1017

## 2023-08-14 NOTE — H&P (Signed)
 History and Physical    Theodore King ZOX:[id number] DOB: [birth year] DOA: 08/14/2023  PCP: [facility]   Patient coming from: [facility] facility    Chief Complaint: fall  HPI: Theodore King is a 78 y.o. male with a pertinent history of dementia, Parkinson's, HTN, thrombocytopenia, chronic suprapubic catheter and presents to the ER after a fall.  EMS placed him in a c-collar.  With a laceration to the right eyebrow.  History was noted to be limited  Patient is not able to provide much of the history. Apparently was talking about a stomachache recently and he was found facedown on the ground, less responsive than usual this morning at his facility.  He points to his suprapubic catheter area that is chronically hurting he states.  He still wasn't feeling well after ED work up and wanted to be admitted.  The suprapubic catheter placed on 3/3 and had urine draining into bag, yellow  He was noted to be pulling on it.    Per report, patient was noted to not be himself, more confused and slurring his words moreso   In the ED, 141/73, HR 70, RR 16, WBC 7.5, Hgb 10.4, PLT 174, NA 138, K4.4, SCR 2.07, CO2 25.  WBC 7.5, Hgb 10.4, PLT 174, UA shows large leuks, negative nitrite, WBC is 11-20 CT head showed soft tissue swelling but NAICA CT cervical without fracture but chronic speinal stenosis at c3-c4, c4-c5 MRI brain without stroke  rocephin    He states that his suprapubic catheter area has been hurting for a while.  He states it is chronic.  He knows his name and thinks that it is April   Difficult to perform review of systems with patients mental status, difficult getting straight answers. Review of Systems: As per HPI otherwise 10 point review of systems negative.  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)    Paralysis agitans (HCC) 05/18/2013   Parkinson's disease (HCC)    Pneumonia due to COVID-19 virus 04/08/2019   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   SOB (shortness of breath) 03/23/2018   Unspecified vitamin D deficiency    Unspecified vitamin D deficiency    Vertigo, benign paroxysmal 11/05/2016   Vitamin D deficiency 05/18/2013    Past Surgical History:  Procedure Laterality Date   APPENDECTOMY     COLONOSCOPY WITH PROPOFOL N/A 10/26/2021   Procedure: COLONOSCOPY WITH PROPOFOL;  Surgeon: Hilarie Fredrickson, MD;  Location: [facility];  Service: Gastroenterology;  Laterality: N/A;   POLYPECTOMY  10/26/2021   Procedure: POLYPECTOMY;  Surgeon: Hilarie Fredrickson, MD;  Location: [facility];  Service: Gastroenterology;;   ROTATOR CUFF REPAIR     right     reports that he quit smoking about 49 years ago. His smoking use included cigarettes. He started smoking about 70 years ago. He has a 10.4 pack-year smoking history. He has never  used smokeless tobacco. He reports that he does not currently use alcohol. He reports that he does not use drugs.  No Known Allergies  Family History  Problem Relation Age of Onset   Cancer Mother    Cancer Father    Stroke Brother    Cancer Sister    Healthy Daughter    Cancer Sister    Lung cancer Brother    Healthy Daughter    Colon cancer Neg Hx    Esophageal cancer Neg Hx    Rectal cancer Neg Hx    Stomach cancer Neg Hx      Prior to Admission medications   Medication Sig Start Date End Date Taking? Authorizing Provider  acetaminophen (TYLENOL) 500 MG tablet Take 1,000 mg by  mouth 2 (two) times daily.    [provider]  albuterol (VENTOLIN HFA) 108 (90 Base) MCG/ACT inhaler Inhale 2 puffs into the lungs every 6 (six) hours as needed for wheezing.    [provider]  amLODipine (NORVASC) 10 MG tablet Take 1 tablet (10 mg total) by mouth daily. 07/20/23   Amin, Ankit C, MD  bisacodyl (DULCOLAX) 10 MG suppository Place 10 mg rectally daily as needed (constipation not relieved by Milk of Magnesia).    [provider]  carbidopa-levodopa (SINEMET CR) 50-200 MG tablet Take 1 tablet by mouth at bedtime. 09/24/21   Penumalli, Glenford Bayley, MD  carbidopa-levodopa (SINEMET IR) 25-100 MG tablet Take 2 tablets at 7 AM, 2 tablets at 10 AM, 2 tablets at 2 PM and 1 tablet at 6 PM Patient taking differently: Take 1-2 tablets by mouth See admin instructions. Take 2 tablets by mouth two times a day and 1 tablet in the evening 09/24/21   Penumalli, Glenford Bayley, MD  Emollient [facility] ADVANCED THERAPY) LOTN Apply 1 application  topically See admin instructions. Apply to all extremities every day shift and every 6 hours as needed for dryness    [provider]  glycopyrrolate (ROBINUL) 1 MG tablet Take 1 tablet (1 mg total) by mouth 2 (two) times daily. 09/24/21   Penumalli, Glenford Bayley, MD  magnesium hydroxide (GOODSENSE MILK OF MAGNESIA) 400 MG/5ML suspension Take 30 mLs by mouth daily as needed (if no B/M after 3 days).    [provider]  nystatin (MYCOSTATIN/NYSTOP) powder Apply 1 Application topically every 8 (eight) hours as needed (moisture-associated dermatitis). Apply to groin area and skin folds.    [provider]  OVER THE COUNTER MEDICATION Take 120 mLs by mouth See admin instructions. MedPass- Drink 120 ml's by mouth once a day if Ensure is not available    [provider]  oxyCODONE (OXY IR/ROXICODONE) 5 MG immediate release tablet Take 1 tablet (5 mg total) by mouth every 6 (six) hours as needed for severe pain (pain score  7-10). 07/19/23   Amin, Ankit C, MD  senna-docusate (SENOKOT-S) 8.6-50 MG tablet Take 1 tablet by mouth at bedtime as needed for mild constipation or moderate constipation. 07/19/23   Miguel Rota, MD  Sodium Phosphates (ENEMA RE) Place 1 enema rectally daily as needed (constipation not relieved by bisacodyl suppository).    [provider]  traZODone (DESYREL) 50 MG tablet Take 50 mg by mouth at bedtime.    [provider]  Vitamin D, Ergocalciferol, (DRISDOL) 1.25 MG (50000 UNIT) CAPS capsule Take 50,000 Units by mouth See admin instructions. Take 50,000 units by mouth on the 3rd of every month    [provider]  Physical Exam: Vitals:   08/14/23 1002 08/14/23 1502 08/14/23 1823 08/15/23 0125  BP: (!) 104/55 (!) 167/7 (!) 141/73 (!) 151/68  Pulse: 60 66 70 69  Resp: 19 15 16 14   Temp: 97.9 F (36.6 C) 98 F (36.7 C) 98.6 F (37 C) 98 F (36.7 C)  TempSrc: Oral Oral Oral Oral  SpO2: 100% 100% 97% 100%    Constitutional: NAD, comfortable, disoriented appearing Eyes: pupils equal  ENMT: dmm somewhat Neck: atraumatic Respiratory: CTAB, nwob  Cardiovascular: no murmurs rubs or gallops heard Abdomen: NBS, NT,   Musculoskeletal: moving all 4 extremities, strength grossly intact  GU: suprapubic catheter placed, no drainage from place. Skin: no rashes, lesions, ulcers. No induration Neurologic: pupils intact Psychiatric: answers some questions appropriately.    Labs on Admission: I have personally reviewed following labs and imaging studies  CBC: Recent Labs  Lab 08/14/23 1139  WBC 7.5  NEUTROABS 5.6  HGB 10.4*  HCT 33.7*  MCV 88.0  PLT 174   Basic Metabolic Panel: Recent Labs  Lab 08/14/23 1139  NA 138  K 4.4  CL 105  CO2 25  GLUCOSE 100*  BUN 28*  CREATININE 2.07*  CALCIUM 9.2   GFR: CrCl cannot be calculated (Unknown ideal weight.). Liver Function Tests: Recent Labs  Lab 08/14/23 1139  AST 13*  ALT 5  ALKPHOS 64  BILITOT  1.6*  PROT 7.3  ALBUMIN 3.5   Recent Labs  Lab 08/14/23 1139  LIPASE 40   No results for input(s): "AMMONIA" in the last 168 hours. Coagulation Profile: No results for input(s): "INR", "PROTIME" in the last 168 hours. Cardiac Enzymes: No results for input(s): "CKTOTAL", "CKMB", "CKMBINDEX", "TROPONINI" in the last 168 hours. BNP (last 3 results) No results for input(s): "PROBNP" in the last 8760 hours. HbA1C: No results for input(s): "HGBA1C" in the last 72 hours. CBG: No results for input(s): "GLUCAP" in the last 168 hours. Lipid Profile: No results for input(s): "CHOL", "HDL", "LDLCALC", "TRIG", "CHOLHDL", "LDLDIRECT" in the last 72 hours. Thyroid Function Tests: No results for input(s): "TSH", "T4TOTAL", "FREET4", "T3FREE", "THYROIDAB" in the last 72 hours. Anemia Panel: No results for input(s): "VITAMINB12", "FOLATE", "FERRITIN", "TIBC", "IRON", "RETICCTPCT" in the last 72 hours. Urine analysis:    Component Value Date/Time   COLORURINE AMBER (A) 08/14/2023 1206   APPEARANCEUR CLOUDY (A) 08/14/2023 1206   LABSPEC 1.011 08/14/2023 1206   PHURINE 9.0 (H) 08/14/2023 1206   GLUCOSEU NEGATIVE 08/14/2023 1206   HGBUR NEGATIVE 08/14/2023 1206   BILIRUBINUR NEGATIVE 08/14/2023 1206   KETONESUR NEGATIVE 08/14/2023 1206   PROTEINUR 100 (A) 08/14/2023 1206   UROBILINOGEN 1.0 04/16/2020 2048   NITRITE NEGATIVE 08/14/2023 1206   LEUKOCYTESUR LARGE (A) 08/14/2023 1206    Radiological Exams on Admission: MR Brain Wo Contrast (neuro protocol) Result Date: 08/14/2023 CLINICAL DATA:  Neuro deficit, acute, stroke suspected. Fall. Laceration. EXAM: MRI HEAD WITHOUT CONTRAST TECHNIQUE: Multiplanar, multiecho pulse sequences of the brain and surrounding structures were obtained without intravenous contrast. COMPARISON:  CT head without contrast 08/14/2023. MR head without contrast 09/04/2022. FINDINGS: Brain: No acute infarct, hemorrhage, or mass lesion is present. Mild periventricular  white matter changes bilaterally are stable. Deep brain nuclei are within normal limits. The ventricles are of normal size. No significant extraaxial fluid collection is present. The brainstem and cerebellum are within normal limits. The internal auditory canals are within normal limits. Midline structures are within normal limits. Vascular: Flow is present in the major intracranial arteries. Skull and upper  cervical spine: The craniocervical junction is normal. Upper cervical spine is within normal limits. Marrow signal is unremarkable. Sinuses/Orbits: The paranasal sinuses and mastoid air cells are clear. Bilateral lens replacements are noted. Globes and orbits are otherwise unremarkable. IMPRESSION: 1. No acute intracranial abnormality or significant interval change. 2. Stable mild periventricular white matter disease. This likely reflects the sequela of chronic microvascular ischemia. Electronically Signed   By: Marin Roberts M.D.   On: 08/14/2023 17:40   CT Cervical Spine Wo Contrast Result Date: 08/14/2023 CLINICAL DATA:  78 year old male status post fall. Eye brow laceration. EXAM: CT CERVICAL SPINE WITHOUT CONTRAST TECHNIQUE: Multidetector CT imaging of the cervical spine was performed without intravenous contrast. Multiplanar CT image reconstructions were also generated. RADIATION DOSE REDUCTION: This exam was performed according to the departmental dose-optimization program which includes automated exposure control, adjustment of the mA and/or kV according to patient size and/or use of iterative reconstruction technique. COMPARISON:  Cervical spine CT 11/17/2022. Head CT today reported separately. FINDINGS: Alignment: Improved cervical lordosis. Cervicothoracic junction alignment is within normal limits. Bilateral posterior element alignment is within normal limits. Skull base and vertebrae: Bone mineralization is within normal limits for age. Visualized skull base is intact. No atlanto-occipital  dissociation. C1 and C2 are severely degenerated, but appears stable, intact. No acute osseous abnormality identified. Soft tissues and spinal canal: No prevertebral fluid or swelling. No visible canal hematoma. Extensive carotid calcified atherosclerosis throughout the bilateral neck. Negative visible noncontrast neck soft tissues otherwise. 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 Superimposed chronic C1-C2 instability. Multifactorial chronic cervical spinal stenosis at C3-C4 and C4-C5 is probably stable from a 2022 MRI (please see that report) Upper chest: Visible upper thoracic levels appear intact. Negative lung apices. IMPRESSION: 1. No acute traumatic injury identified in the cervical spine. 2. Advanced chronic cervical spine degeneration with chronic spinal stenosis at C3-C4 and C4-C5. 3. Carotid atherosclerosis. Electronically Signed   By: Odessa Fleming M.D.   On: 08/14/2023 11:52   CT Head Wo Contrast Result Date: 08/14/2023 CLINICAL DATA:  78 year old male status post fall. Eye brow laceration. EXAM: CT HEAD WITHOUT CONTRAST TECHNIQUE: Contiguous axial images were obtained from the base of the skull through the vertex without intravenous contrast. RADIATION DOSE REDUCTION: This exam was performed according to the departmental dose-optimization program which includes automated exposure control, adjustment of the mA and/or kV according to patient size and/or use of iterative reconstruction technique. COMPARISON:  Brain MRI 09/04/2022.  Head CT 11/17/2022. FINDINGS: Brain: Stable cerebral volume. Stable dural calcifications. No midline shift, ventriculomegaly, mass effect, evidence of mass lesion, intracranial hemorrhage or evidence of cortically based acute infarction. Patchy moderate for age periventricular white matter hypodensity, gray-white differentiation is stable. Vascular: Calcified atherosclerosis at the skull base. No suspicious intracranial vascular hyperdensity.  Skull: No acute osseous abnormality identified. Sinuses/Orbits: Visualized paranasal sinuses and mastoids are stable and well aerated. Other: Mild right lateral orbit soft tissue swelling series 3, image 35. No other acute orbit or scalp soft tissue injury identified. IMPRESSION: 1. Mild right lateral orbit soft tissue swelling. 2. No acute intracranial abnormality or acute skull fracture identified. Chronic cerebral white matter disease. Electronically Signed   By: Odessa Fleming M.D.   On: 08/14/2023 11:25    EKG: Independently reviewed.    Assessment/Plan Principal Problem:   Fall  Still needs to be med recced as of 4:13am  #abd pain, think from his suprapbuic which he states is chronic, consider constipation as well --noted to  be pulling on it.  #concern for complicated UTI adding to his confusion by report.  Saying that he was more confused than usual. #borderline UA with a suprapubic catheter but will continue ceftriaxone and consider getting another sample  --consider placing suprapubic catheter flushes to ensure patency or even having urology place another before leaving ?with a fresh sample of urine? --I asked if he felt like he needed to urinate and he didn't think so (hinting a backing up) --PT in the am and then maybe back to [facility]   Concern for encephalopathy, or difference in mentation and MRI of the brain was reassuringly okay.   --was not able to get in touch with daughter for baseline --HTN, holding bp meds until the am --insomnia, will cont trazodone for now --constipation- just miralax prn, titrate them accordingly pls --ckd --Dementia, Parkinsons, continue carbidopa/levodopa regimens   ckd anemia  Patient and/or Family completely agreed with the plan, expressed understanding and I answered all questions.  DVT prophylaxis: Heparin SQ Code Status: Full code Full code noted on [facility]'s documentation, not able to decide for himself currently.   Family Communication:  call daughter when able Disposition Plan: I suspect back to [facility] once stabilized Consults called: na Admission status: observation for now     A total of 80  minutes utilized during this admission.  Charlane Ferretti DO [facility]   If 7PM-7AM, please contact night-coverage [email or web address] Password [email or web address]  08/15/2023, 4:11 AM

## 2023-08-14 NOTE — ED Triage Notes (Signed)
 Patient arrived by [facility] from [place] with reported fall. Reported no thinners and patient has small laceration to rright eyebrow. Arrived with c-collar placed by ems. Wound cleaned. Hx of parkinsons and only alert to person

## 2023-08-14 NOTE — ED Provider Notes (Signed)
 [place] EMERGENCY DEPARTMENT AT [facility] Provider Note   CSN: [id number] Arrival date & time: 08/14/23  7829     History  No chief complaint on file.   Theodore King is a 78 y.o. male.  Patient is a 78 year old male with PMH dementia, Parkinson's, HTN, thrombocytopenia and chronic suprapubic catheter in place presenting to the ER after a fall.  Per EMS report, the patient is not on any blood thinners and is ANO x 1 at baseline.  They placed a c-collar prior to arrival.  They noted that he had a laceration to his right eyebrow.  History is otherwise limited due to patient's dementia.  I attempted to call [facility] and him pending callback at this time.    The history is provided by the EMS personnel. The history is limited by the condition of the patient (Level 5 caveat for dementia).       Home Medications Prior to Admission medications   Medication Sig Start Date End Date Taking? Authorizing Provider  acetaminophen (TYLENOL) 500 MG tablet Take 1,000 mg by mouth 2 (two) times daily.    [provider]  albuterol (VENTOLIN HFA) 108 (90 Base) MCG/ACT inhaler Inhale 2 puffs into the lungs every 6 (six) hours as needed for wheezing.    [provider]  amLODipine (NORVASC) 10 MG tablet Take 1 tablet (10 mg total) by mouth daily. 07/20/23   Amin, Ankit C, MD  bisacodyl (DULCOLAX) 10 MG suppository Place 10 mg rectally daily as needed (constipation not relieved by Milk of Magnesia).    [provider]  carbidopa-levodopa (SINEMET CR) 50-200 MG tablet Take 1 tablet by mouth at bedtime. 09/24/21   Penumalli, Glenford Bayley, MD  carbidopa-levodopa (SINEMET IR) 25-100 MG tablet Take 2 tablets at 7 AM, 2 tablets at 10 AM, 2 tablets at 2 PM and 1 tablet at 6 PM Patient taking differently: Take 1-2 tablets by mouth See admin instructions. Take 2 tablets by mouth two times a day and 1 tablet in the evening 09/24/21   Penumalli, Glenford Bayley, MD  Emollient  [facility] ADVANCED THERAPY) LOTN Apply 1 application  topically See admin instructions. Apply to all extremities every day shift and every 6 hours as needed for dryness    [provider]  glycopyrrolate (ROBINUL) 1 MG tablet Take 1 tablet (1 mg total) by mouth 2 (two) times daily. 09/24/21   Penumalli, Glenford Bayley, MD  magnesium hydroxide (GOODSENSE MILK OF MAGNESIA) 400 MG/5ML suspension Take 30 mLs by mouth daily as needed (if no B/M after 3 days).    [provider]  nystatin (MYCOSTATIN/NYSTOP) powder Apply 1 Application topically every 8 (eight) hours as needed (moisture-associated dermatitis). Apply to groin area and skin folds.    [provider]  OVER THE COUNTER MEDICATION Take 120 mLs by mouth See admin instructions. MedPass- Drink 120 ml's by mouth once a day if Ensure is not available    [provider]  oxyCODONE (OXY IR/ROXICODONE) 5 MG immediate release tablet Take 1 tablet (5 mg total) by mouth every 6 (six) hours as needed for severe pain (pain score 7-10). 07/19/23   Amin, Ankit C, MD  senna-docusate (SENOKOT-S) 8.6-50 MG tablet Take 1 tablet by mouth at bedtime as needed for mild constipation or moderate constipation. 07/19/23   Miguel Rota, MD  Sodium Phosphates (ENEMA RE) Place 1 enema rectally daily as needed (constipation not relieved by bisacodyl suppository).    [provider]  traZODone (DESYREL) 50 MG tablet Take 50 mg by mouth at bedtime.    [provider]  Vitamin D, Ergocalciferol, (DRISDOL) 1.25 MG (50000 UNIT) CAPS capsule Take 50,000 Units by mouth See admin instructions. Take 50,000 units by mouth on the 3rd of every month    [provider]      Allergies    Patient has no known allergies.    Review of Systems   Review of Systems  Physical Exam Updated Vital Signs BP (!) 167/7 (BP Location: Right Arm)   Pulse 66   Temp 98 F (36.7 C) (Oral)   Resp 15   SpO2 100%  Physical Exam Vitals and  nursing note reviewed.  Constitutional:      General: He is not in acute distress.    Appearance: Normal appearance.  HENT:     Head: Normocephalic.     Comments: Small non-bleeding, non-gaping laceration vs abrasion to R eyebrow    Nose: Nose normal.     Mouth/Throat:     Mouth: Mucous membranes are moist.  Eyes:     Extraocular Movements: Extraocular movements intact.     Conjunctiva/sclera: Conjunctivae normal.  Neck:     Comments: No midline neck tenderness, c-collar in place Cardiovascular:     Rate and Rhythm: Normal rate and regular rhythm.     Heart sounds: Normal heart sounds.  Pulmonary:     Effort: Pulmonary effort is normal.     Breath sounds: Normal breath sounds.  Abdominal:     General: Abdomen is flat.     Palpations: Abdomen is soft.     Tenderness: There is no abdominal tenderness.     Comments: Suprapubic catheter in place, bagged tinged purple, urine is yellow. Suprapubic site appears clean and dry with no surrounding erythema or warmth  Musculoskeletal:        General: Normal range of motion.     Comments: No midline back tenderness, pelvis stable, nontender, no bony tenderness to bilateral upper or lower extremities  Skin:    General: Skin is warm and dry.  Neurological:     General: No focal deficit present.     Mental Status: He is alert.     Comments: Oriented to person only, following commands in all 4 extremities, somewhat garbled speech.  Psychiatric:        Mood and Affect: Mood normal.        Behavior: Behavior normal.     ED Results / Procedures / Treatments   Labs (all labs ordered are listed, but only abnormal results are displayed) Labs Reviewed  COMPREHENSIVE METABOLIC PANEL - Abnormal; Notable for the following components:      Result Value   Glucose, Bld 100 (*)    BUN 28 (*)    Creatinine, Ser 2.07 (*)    AST 13 (*)    Total Bilirubin 1.6 (*)    GFR, Estimated 32 (*)    All other components within normal limits  CBC WITH  DIFFERENTIAL/PLATELET - Abnormal; Notable for the following components:   RBC 3.83 (*)    Hemoglobin 10.4 (*)    HCT 33.7 (*)    All other components within normal limits  URINALYSIS, W/ REFLEX TO CULTURE (INFECTION SUSPECTED) - Abnormal; Notable for the following components:   Color, Urine AMBER (*)    APPearance CLOUDY (*)    pH 9.0 (*)    Protein, ur 100 (*)    Leukocytes,Ua LARGE (*)  Bacteria, UA RARE (*)    All other components within normal limits  URINE CULTURE  LIPASE, BLOOD    EKG None  Radiology CT Cervical Spine Wo Contrast Result Date: 08/14/2023 CLINICAL DATA:  78 year old male status post fall. Eye brow laceration. EXAM: CT CERVICAL SPINE WITHOUT CONTRAST TECHNIQUE: Multidetector CT imaging of the cervical spine was performed without intravenous contrast. Multiplanar CT image reconstructions were also generated. RADIATION DOSE REDUCTION: This exam was performed according to the departmental dose-optimization program which includes automated exposure control, adjustment of the mA and/or kV according to patient size and/or use of iterative reconstruction technique. COMPARISON:  Cervical spine CT 11/17/2022. Head CT today reported separately. FINDINGS: Alignment: Improved cervical lordosis. Cervicothoracic junction alignment is within normal limits. Bilateral posterior element alignment is within normal limits. Skull base and vertebrae: Bone mineralization is within normal limits for age. Visualized skull base is intact. No atlanto-occipital dissociation. C1 and C2 are severely degenerated, but appears stable, intact. No acute osseous abnormality identified. Soft tissues and spinal canal: No prevertebral fluid or swelling. No visible canal hematoma. Extensive carotid calcified atherosclerosis throughout the bilateral neck. Negative visible noncontrast neck soft tissues otherwise. 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 Superimposed chronic C1-C2  instability. Multifactorial chronic cervical spinal stenosis at C3-C4 and C4-C5 is probably stable from a 2022 MRI (please see that report) Upper chest: Visible upper thoracic levels appear intact. Negative lung apices. IMPRESSION: 1. No acute traumatic injury identified in the cervical spine. 2. Advanced chronic cervical spine degeneration with chronic spinal stenosis at C3-C4 and C4-C5. 3. Carotid atherosclerosis. Electronically Signed   By: Odessa Fleming M.D.   On: 08/14/2023 11:52   CT Head Wo Contrast Result Date: 08/14/2023 CLINICAL DATA:  78 year old male status post fall. Eye brow laceration. EXAM: CT HEAD WITHOUT CONTRAST TECHNIQUE: Contiguous axial images were obtained from the base of the skull through the vertex without intravenous contrast. RADIATION DOSE REDUCTION: This exam was performed according to the departmental dose-optimization program which includes automated exposure control, adjustment of the mA and/or kV according to patient size and/or use of iterative reconstruction technique. COMPARISON:  Brain MRI 09/04/2022.  Head CT 11/17/2022. FINDINGS: Brain: Stable cerebral volume. Stable dural calcifications. No midline shift, ventriculomegaly, mass effect, evidence of mass lesion, intracranial hemorrhage or evidence of cortically based acute infarction. Patchy moderate for age periventricular white matter hypodensity, gray-white differentiation is stable. Vascular: Calcified atherosclerosis at the skull base. No suspicious intracranial vascular hyperdensity. Skull: No acute osseous abnormality identified. Sinuses/Orbits: Visualized paranasal sinuses and mastoids are stable and well aerated. Other: Mild right lateral orbit soft tissue swelling series 3, image 35. No other acute orbit or scalp soft tissue injury identified. IMPRESSION: 1. Mild right lateral orbit soft tissue swelling. 2. No acute intracranial abnormality or acute skull fracture identified. Chronic cerebral white matter disease.  Electronically Signed   By: Odessa Fleming M.D.   On: 08/14/2023 11:25    Procedures Procedures    Medications Ordered in ED Medications  LORazepam (ATIVAN) tablet 0.5 mg (has no administration in time range)  cefTRIAXone (ROCEPHIN) 1 g in sodium chloride 0.9 % 100 mL IVPB (0 g Intravenous Stopped 08/14/23 1521)    ED Course/ Medical Decision Making/ A&P Clinical Course as of 08/14/23 1552  Sun Aug 14, 2023  1121 Fall unwitnessed. Complained of stomach ache this morning and gave morning meds. When the aid came back he was found face down on the ground. Normally is oriented to person, place and  time. Was less responsive than usual this morning. Normally can say if he's hurting or in pain and has clear speech. Was normal when he woke up this morning and AMS appeared to start after the fall. Will add on additional labs and urine. [VK]  1246 Large leuks, rare bacteria in urine. Patient unable to specify symptoms and with mental status change will plan treating for UTI. Urine culture will be sent. Mild increased Cr from baseline. No traumatic injury on imaging. [VK]  1321 Patient's speech sounds improved from when he arrived. Oriented to person, place, year but not month. Able to identify objects. Speech still slightly garbled sounding. Per NH staff it does seem like speech if off from baseline. Will obtain MRI to eval for stroke. [VK]  1551 Patient signed out to Dr Tegeler pending MRI in stable condition. [VK]    Clinical Course User Index [VK] Rexford Maus, DO                                 Medical Decision Making This patient presents to the ED with chief complaint(s) of fall with pertinent past medical history of dementia, Parkinson's, HTN, thrombocytopenia which further complicates the presenting complaint. The complaint involves an extensive differential diagnosis and also carries with it a high risk of complications and morbidity.    The differential diagnosis includes due to patient's  age and history of thrombocytopenia concern for ICH, mass effect, cervical spine injury, no other traumatic injury seen on exam, unclear based on history if this was a possible syncopal fall, patient otherwise appears to be at his neurologic baseline, abrasion, laceration  Additional history obtained: Additional history obtained from EMS  Records reviewed Nursing Home Documents  ED Course and Reassessment: On patient's arrival he is hemodynamically stable in no acute distress.  Was initially evaluated by provider in triage and had CT head and C-spine performed.  Patient no other traumatic injury seen on exam.  Attempting to obtain additional information from nursing home staff regarding fall.  He will have his wound irrigated and evaluated.  Show management and laceration repairs.  Independent labs interpretation:  The following labs were independently interpreted: UA with leuks and bacteria concerning for UTI, otherwise no acute abnoramlity  Independent visualization of imaging: - I independently visualized the following imaging with scope of interpretation limited to determining acute life threatening conditions related to emergency care: CTH/C-spine, which revealed no acute traumatic injury    Amount and/or Complexity of Data Reviewed Labs: ordered. Radiology: ordered.  Risk Prescription drug management.          Final Clinical Impression(s) / ED Diagnoses Final diagnoses:  Fall, initial encounter  Disorientation  Acute cystitis without hematuria    Rx / DC Orders ED Discharge Orders     None         Rexford Maus, DO 08/14/23 1552

## 2023-08-14 NOTE — ED Notes (Signed)
 Patient transported to CT

## 2023-08-14 NOTE — ED Provider Notes (Signed)
 Care assumed by Dr. Theresia Lo.  At time of transfer care, patient is waiting for results of MRI to look for evidence of stroke related to this altered mental status.  Patient was found to have likely urinary tract infection and was given antibiotics.  Plan of care is to reassess to look for evidence of stroke and if it is negative, will reassess to determine if patient is well-appearing enough for discharge home for outpatient treatment of UTI or if any to admitted for inpatient management of UTI with altered mental status and falls.  6:14 PM MRI returned without acute stroke.  I reassessed patient and he reports he is still feeling tired and fatigued and "off.  He still feels ill.  He says he does not feel safe going home with the urinary tract infection and still feeling like he could fall and feeling bad.  His creatinine was similar but was several weeks ago.  Will admit for further management of fatigue, altered renal status, fall in the setting of UTI.   Oneill Bais, Canary Brim, MD 08/14/23 2126

## 2023-08-15 DIAGNOSIS — W19XXXA Unspecified fall, initial encounter: Secondary | ICD-10-CM | POA: Diagnosis not present

## 2023-08-15 DIAGNOSIS — N390 Urinary tract infection, site not specified: Secondary | ICD-10-CM | POA: Diagnosis not present

## 2023-08-15 LAB — COMPREHENSIVE METABOLIC PANEL
ALT: 10 U/L (ref 0–44)
AST: 15 U/L (ref 15–41)
Albumin: 3.4 g/dL — ABNORMAL LOW (ref 3.5–5.0)
Alkaline Phosphatase: 66 U/L (ref 38–126)
Anion gap: 11 (ref 5–15)
BUN: 29 mg/dL — ABNORMAL HIGH (ref 8–23)
CO2: 21 mmol/L — ABNORMAL LOW (ref 22–32)
Calcium: 9.2 mg/dL (ref 8.9–10.3)
Chloride: 106 mmol/L (ref 98–111)
Creatinine, Ser: 1.86 mg/dL — ABNORMAL HIGH (ref 0.61–1.24)
GFR, Estimated: 36 mL/min — ABNORMAL LOW (ref >60)
Glucose, Bld: 81 mg/dL (ref 70–99)
Potassium: 4.4 mmol/L (ref 3.5–5.1)
Sodium: 138 mmol/L (ref 135–145)
Total Bilirubin: 1.9 mg/dL — ABNORMAL HIGH (ref 0.0–1.2)
Total Protein: 7.1 g/dL (ref 6.5–8.1)

## 2023-08-15 LAB — CBC
HCT: 34.5 % — ABNORMAL LOW (ref 39.0–52.0)
Hemoglobin: 10.8 g/dL — ABNORMAL LOW (ref 13.0–17.0)
MCH: 27.7 pg (ref 26.0–34.0)
MCHC: 31.3 g/dL (ref 30.0–36.0)
MCV: 88.5 fL (ref 80.0–100.0)
Platelets: 159 10*3/uL (ref 150–400)
RBC: 3.9 MIL/uL — ABNORMAL LOW (ref 4.22–5.81)
RDW: 13.8 % (ref 11.5–15.5)
WBC: 8.4 10*3/uL (ref 4.0–10.5)
nRBC: 0 % (ref 0.0–0.2)

## 2023-08-15 LAB — URINE CULTURE

## 2023-08-15 MED ORDER — HEPARIN SODIUM (PORCINE) 5000 UNIT/ML IJ SOLN
5000.0000 [IU] | Freq: Three times a day (TID) | INTRAMUSCULAR | Status: DC
  Administered 2023-08-15 – 2023-08-18 (×10): 5000 [IU] via SUBCUTANEOUS
  Filled 2023-08-15 (×10): qty 1

## 2023-08-15 MED ORDER — SODIUM CHLORIDE 0.9 % IV SOLN
INTRAVENOUS | Status: DC
Start: 2023-08-15 — End: 2023-08-16

## 2023-08-15 MED ORDER — TRAZODONE HCL 50 MG PO TABS
50.0000 mg | ORAL_TABLET | Freq: Every day | ORAL | Status: DC
  Administered 2023-08-15 – 2023-08-17 (×4): 50 mg via ORAL
  Filled 2023-08-15 (×4): qty 1

## 2023-08-15 MED ORDER — CARBIDOPA-LEVODOPA ER 50-200 MG PO TBCR
1.0000 | EXTENDED_RELEASE_TABLET | Freq: Every day | ORAL | Status: DC
  Administered 2023-08-15 – 2023-08-17 (×3): 1 via ORAL
  Filled 2023-08-15 (×5): qty 1

## 2023-08-15 MED ORDER — POLYETHYLENE GLYCOL 3350 17 G PO PACK: 17.0000 g | PACK | Freq: Every day | ORAL | Status: DC | PRN

## 2023-08-15 MED ORDER — CARBIDOPA-LEVODOPA 25-100 MG PO TABS
2.0000 | ORAL_TABLET | Freq: Three times a day (TID) | ORAL | Status: DC
  Administered 2023-08-16 – 2023-08-18 (×8): 2 via ORAL
  Filled 2023-08-15 (×9): qty 2

## 2023-08-15 MED ORDER — BISACODYL 5 MG PO TBEC: 5.0000 mg | DELAYED_RELEASE_TABLET | Freq: Every day | ORAL | Status: DC | PRN

## 2023-08-15 MED ORDER — CARBIDOPA-LEVODOPA 25-100 MG PO TABS
2.0000 | ORAL_TABLET | Freq: Three times a day (TID) | ORAL | Status: DC
  Administered 2023-08-15 (×2): 2 via ORAL
  Filled 2023-08-15 (×4): qty 2

## 2023-08-15 MED ORDER — HEPARIN SODIUM (PORCINE) 5000 UNIT/ML IJ SOLN: 5000.0000 [IU] | Freq: Three times a day (TID) | INTRAMUSCULAR | Status: DC

## 2023-08-15 MED ORDER — CARBIDOPA-LEVODOPA 25-100 MG PO TABS: 1.0000 | ORAL_TABLET | ORAL | Status: DC

## 2023-08-15 NOTE — ED Notes (Signed)
 Dr, Rancour looked at patient lac over right eye, 2 steri strips applied.

## 2023-08-15 NOTE — Progress Notes (Signed)
 PROGRESS NOTE    Theodore King  ZOX:[id number] DOB: [birth year] DOA: 08/14/2023 PCP: [facility]  Outpatient Specialists:     Brief Narrative:  Patient is a 79 year old male with past medical history significant for dementia, Parkinson's, hypertension, thrombocytopenia and chronic suprapubic catheterization.  Patient presented to the emergency room following a fall.  Patient is not able to give any significant history.  Collateral information revealed that patient may have been less responsive than usual.  UA done on presentation revealed large leukocyte esterase, pH of 9, protein of 100, specific gravity of 1.011 and WBC of 11-20.  Triple phosphate crystals was found on urinalysis.  Urine culture is growing multiple species, likely contaminated urine.  Will repeat urine culture, however, patient is already on antibiotics.  CT head revealed mild right lateral orbit soft tissue swelling but no acute intracranial abnormality or acute skull fracture.  Chronic cerebral white matter disease was reported.  CT cervical spine did not reveal any acute traumatic injury to the cervical spine.  Advanced chronic cervical spine degeneration with chronic spinal stenosis at C3-C4 and C4-C5, with carotid atherosclerosis reported.  08/15/2023: Patient seen.  No history from patient.  Patient is sleeping quietly.  I suspect the patient has altered sleep pattern from the dementia.   Assessment & Plan:   Principal Problem:   Fall   Abdominal pain: -Patient has chronic suprapubic Foley catheter. -Abdominal pain may be related to possible UTI. -Will continue to assess.     Concern for complicated UTI: -Urine culture grew multiple species, likely contaminant. -Repeat urine culture. -Worthy to mention the patient started on IV Rocephin. -Low threshold to exchange suprapubic catheter.     Possible acute metabolic encephalopathy: -Hydrate patient adequately. -Treat possible  UTI. -Avoid mind altering medications. -Optimize Parkinson's management. -CT head has not shown any significant findings. -Further management depend on hospital course.  Hypertension: -Reasonably controlled.  Chronic kidney disease stage IIIb: -Stable.  Dementia: -No behavioral problems.  Parkinson's disease: -Continue Sinemet.  Anemia: -Hemoglobin of 10.8 g/dL. -Normal MCV (88.5). -Patient is iron deficient. -Relatively low vitamin B12 (379). -Will check folate level.    DVT prophylaxis: Subcutaneous heparin Code Status: Code Family Communication:  Disposition Plan: Observation.   Consultants:  None.  Procedures:  None.  Antimicrobials:  IV Rocephin.   Subjective: No history from patient.  Objective: Vitals:   08/15/23 0125 08/15/23 0438 08/15/23 0806 08/15/23 1604  BP: (!) 151/68 (!) 134/90 (!) 137/54 130/77  Pulse: 69 62 63 (!) 59  Resp: 14 14 18 18   Temp: 98 F (36.7 C) 98 F (36.7 C) 98 F (36.7 C) 97.7 F (36.5 C)  TempSrc: Oral Oral Oral Oral  SpO2: 100% 100% 100% 100%    Intake/Output Summary (Last 24 hours) at 08/15/2023 1910 Last data filed at 08/15/2023 1603 Gross per 24 hour  Intake 120 ml  Output 1575 ml  Net -1455 ml   There were no vitals filed for this visit.  Examination:  General exam: Appears calm and comfortable.  Patient is sleeping. Respiratory system: Clear to auscultation.  Cardiovascular system: S1 & S2 heard Gastrointestinal system: Abdomen is soft  Central nervous system: Sleeping quietly.     Data Reviewed: I have personally reviewed following labs and imaging studies  CBC: Recent Labs  Lab 08/14/23 1139 08/15/23 0617  WBC 7.5 8.4  NEUTROABS 5.6  --   HGB 10.4* 10.8*  HCT 33.7* 34.5*  MCV 88.0 88.5  PLT 174  159   Basic Metabolic Panel: Recent Labs  Lab 08/14/23 1139 08/15/23 0617  NA 138 138  K 4.4 4.4  CL 105 106  CO2 25 21*  GLUCOSE 100* 81  BUN 28* 29*  CREATININE 2.07* 1.86*  CALCIUM  9.2 9.2   GFR: CrCl cannot be calculated (Unknown ideal weight.). Liver Function Tests: Recent Labs  Lab 08/14/23 1139 08/15/23 0617  AST 13* 15  ALT 5 10  ALKPHOS 64 66  BILITOT 1.6* 1.9*  PROT 7.3 7.1  ALBUMIN 3.5 3.4*   Recent Labs  Lab 08/14/23 1139  LIPASE 40   No results for input(s): "AMMONIA" in the last 168 hours. Coagulation Profile: No results for input(s): "INR", "PROTIME" in the last 168 hours. Cardiac Enzymes: No results for input(s): "CKTOTAL", "CKMB", "CKMBINDEX", "TROPONINI" in the last 168 hours. BNP (last 3 results) No results for input(s): "PROBNP" in the last 8760 hours. HbA1C: No results for input(s): "HGBA1C" in the last 72 hours. CBG: No results for input(s): "GLUCAP" in the last 168 hours. Lipid Profile: No results for input(s): "CHOL", "HDL", "LDLCALC", "TRIG", "CHOLHDL", "LDLDIRECT" in the last 72 hours. Thyroid Function Tests: No results for input(s): "TSH", "T4TOTAL", "FREET4", "T3FREE", "THYROIDAB" in the last 72 hours. Anemia Panel: No results for input(s): "VITAMINB12", "FOLATE", "FERRITIN", "TIBC", "IRON", "RETICCTPCT" in the last 72 hours. Urine analysis:    Component Value Date/Time   COLORURINE AMBER (A) 08/14/2023 1206   APPEARANCEUR CLOUDY (A) 08/14/2023 1206   LABSPEC 1.011 08/14/2023 1206   PHURINE 9.0 (H) 08/14/2023 1206   GLUCOSEU NEGATIVE 08/14/2023 1206   HGBUR NEGATIVE 08/14/2023 1206   BILIRUBINUR NEGATIVE 08/14/2023 1206   KETONESUR NEGATIVE 08/14/2023 1206   PROTEINUR 100 (A) 08/14/2023 1206   UROBILINOGEN 1.0 04/16/2020 2048   NITRITE NEGATIVE 08/14/2023 1206   LEUKOCYTESUR LARGE (A) 08/14/2023 1206   Sepsis Labs: @LABRCNTIP (procalcitonin:4,lacticidven:4)  ) Recent Results (from the past 240 hours)  Urine Culture     Status: Abnormal   Collection Time: 08/14/23 12:06 PM   Specimen: Urine, Random  Result Value Ref Range Status   Specimen Description URINE, RANDOM  Final   Special Requests   Final    NONE  Reflexed from [id number] Performed at [facility], [address] [place], [place] [address]    Culture MULTIPLE SPECIES PRESENT, SUGGEST RECOLLECTION (A)  Final   Report Status 08/15/2023 FINAL  Final         Radiology Studies: MR Brain Wo Contrast (neuro protocol) Result Date: 08/14/2023 CLINICAL DATA:  Neuro deficit, acute, stroke suspected. Fall. Laceration. EXAM: MRI HEAD WITHOUT CONTRAST TECHNIQUE: Multiplanar, multiecho pulse sequences of the brain and surrounding structures were obtained without intravenous contrast. COMPARISON:  CT head without contrast 08/14/2023. MR head without contrast 09/04/2022. FINDINGS: Brain: No acute infarct, hemorrhage, or mass lesion is present. Mild periventricular white matter changes bilaterally are stable. Deep brain nuclei are within normal limits. The ventricles are of normal size. No significant extraaxial fluid collection is present. The brainstem and cerebellum are within normal limits. The internal auditory canals are within normal limits. Midline structures are within normal limits. Vascular: Flow is present in the major intracranial arteries. Skull and upper cervical spine: The craniocervical junction is normal. Upper cervical spine is within normal limits. Marrow signal is unremarkable. Sinuses/Orbits: The paranasal sinuses and mastoid air cells are clear. Bilateral lens replacements are noted. Globes and orbits are otherwise unremarkable. IMPRESSION: 1. No acute intracranial abnormality or significant interval change. 2. Stable mild periventricular  white matter disease. This likely reflects the sequela of chronic microvascular ischemia. Electronically Signed   By: Marin Roberts M.D.   On: 08/14/2023 17:40   CT Cervical Spine Wo Contrast Result Date: 08/14/2023 CLINICAL DATA:  78 year old male status post fall. Eye brow laceration. EXAM: CT CERVICAL SPINE WITHOUT CONTRAST TECHNIQUE: Multidetector CT imaging of the cervical spine was  performed without intravenous contrast. Multiplanar CT image reconstructions were also generated. RADIATION DOSE REDUCTION: This exam was performed according to the departmental dose-optimization program which includes automated exposure control, adjustment of the mA and/or kV according to patient size and/or use of iterative reconstruction technique. COMPARISON:  Cervical spine CT 11/17/2022. Head CT today reported separately. FINDINGS: Alignment: Improved cervical lordosis. Cervicothoracic junction alignment is within normal limits. Bilateral posterior element alignment is within normal limits. Skull base and vertebrae: Bone mineralization is within normal limits for age. Visualized skull base is intact. No atlanto-occipital dissociation. C1 and C2 are severely degenerated, but appears stable, intact. No acute osseous abnormality identified. Soft tissues and spinal canal: No prevertebral fluid or swelling. No visible canal hematoma. Extensive carotid calcified atherosclerosis throughout the bilateral neck. Negative visible noncontrast neck soft tissues otherwise. 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 Superimposed chronic C1-C2 instability. Multifactorial chronic cervical spinal stenosis at C3-C4 and C4-C5 is probably stable from a 2022 MRI (please see that report) Upper chest: Visible upper thoracic levels appear intact. Negative lung apices. IMPRESSION: 1. No acute traumatic injury identified in the cervical spine. 2. Advanced chronic cervical spine degeneration with chronic spinal stenosis at C3-C4 and C4-C5. 3. Carotid atherosclerosis. Electronically Signed   By: Odessa Fleming M.D.   On: 08/14/2023 11:52   CT Head Wo Contrast Result Date: 08/14/2023 CLINICAL DATA:  78 year old male status post fall. Eye brow laceration. EXAM: CT HEAD WITHOUT CONTRAST TECHNIQUE: Contiguous axial images were obtained from the base of the skull through the vertex without intravenous contrast.  RADIATION DOSE REDUCTION: This exam was performed according to the departmental dose-optimization program which includes automated exposure control, adjustment of the mA and/or kV according to patient size and/or use of iterative reconstruction technique. COMPARISON:  Brain MRI 09/04/2022.  Head CT 11/17/2022. FINDINGS: Brain: Stable cerebral volume. Stable dural calcifications. No midline shift, ventriculomegaly, mass effect, evidence of mass lesion, intracranial hemorrhage or evidence of cortically based acute infarction. Patchy moderate for age periventricular white matter hypodensity, gray-white differentiation is stable. Vascular: Calcified atherosclerosis at the skull base. No suspicious intracranial vascular hyperdensity. Skull: No acute osseous abnormality identified. Sinuses/Orbits: Visualized paranasal sinuses and mastoids are stable and well aerated. Other: Mild right lateral orbit soft tissue swelling series 3, image 35. No other acute orbit or scalp soft tissue injury identified. IMPRESSION: 1. Mild right lateral orbit soft tissue swelling. 2. No acute intracranial abnormality or acute skull fracture identified. Chronic cerebral white matter disease. Electronically Signed   By: Odessa Fleming M.D.   On: 08/14/2023 11:25        Scheduled Meds:  carbidopa-levodopa  1 tablet Oral QHS   carbidopa-levodopa  2 tablet Oral TID   heparin  5,000 Units Subcutaneous Q8H   traZODone  50 mg Oral QHS   Continuous Infusions:  cefTRIAXone (ROCEPHIN)  IV 1 g (08/15/23 1710)     LOS: 0 days    Time spent: 55 minutes    Berton Mount, MD  [facility] Pager #: [phone] 7PM-7AM contact night coverage as above

## 2023-08-15 NOTE — Progress Notes (Signed)
 Unable to complete admission documentation, patient very aphasic, per Primary RN, this is patient's baseline

## 2023-08-15 NOTE — Plan of Care (Signed)

## 2023-08-15 NOTE — NC FL2 (Signed)
 [place] MEDICAID FL2 LEVEL OF CARE FORM     IDENTIFICATION  Patient Name: Theodore King Birthdate: [birth year] Sex: male Admission Date (Current Location): 08/14/2023  [facility] and [place] Number:  Producer, television/film/video and Address:  [facility], [address], [place], [place] [address]      Provider Number: [id number]  Attending Physician Name and Address:  Barnetta Chapel, MD  Relative Name and Phone Number:  Ayson Cherubini; Daughter; [phone]    Current Level of Care: Hospital Recommended Level of Care: Skilled Nursing Facility Prior Approval Number:    Date Approved/Denied:   PASRR Number: [id number]  Discharge Plan: SNF    Current Diagnoses: Patient Active Problem List   Diagnosis Date Noted   Fall 08/14/2023   Chronic kidney disease, stage 3b (HCC) 07/17/2023   Anemia of chronic disease 07/17/2023   Displacement of Foley catheter, initial encounter (HCC) 07/16/2023   Atelectasis 01/18/2023   Chronic indwelling Foley catheter    Sepsis (HCC) 04/04/2022   Pressure injury of skin 02/03/2022   Urinary retention 02/01/2022   Foley catheter problem, initial encounter (HCC) 01/31/2022   Pulmonary nodule 01/31/2022   Syncope 11/18/2021   UTI (urinary tract infection) 11/12/2021   Essential hypertension 11/11/2021   Bacteremia 11/10/2021   Hypokalemia 11/09/2021   Normocytic anemia 11/09/2021   Benign neoplasm of transverse colon    Abnormal CT of the abdomen    Acute renal failure superimposed on stage 3a chronic kidney disease (HCC) 10/23/2021   Thrombocytopenia (HCC) 10/23/2021   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 10/23/2021   Hyperbilirubinemia 10/23/2021   Colonic mass 10/23/2021   Aortic atherosclerosis (HCC) 10/23/2021   History of 2019 novel coronavirus disease (COVID-19) 04/24/2019   Dysphonia 10/16/2018   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   Former smoker  08/15/2018   SOB (shortness of breath) 03/23/2018   Vertigo, benign paroxysmal 11/05/2016   Abnormal dreams 09/06/2013   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   Hyperglycemia 05/18/2013   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) 05/18/2013   Vitamin D deficiency 05/18/2013   Hyperlipidemia 05/18/2013   Parkinson's disease (HCC) 10/26/2012    Orientation RESPIRATION BLADDER Height & Weight     Self  Normal (Room Air) Incontinent, External catheter Weight:   Height:     BEHAVIORAL SYMPTOMS/MOOD NEUROLOGICAL BOWEL NUTRITION STATUS      Continent Diet (Please see dc summary)  AMBULATORY STATUS COMMUNICATION OF NEEDS Skin     Verbally Normal                       Personal Care Assistance Level of Assistance              Functional Limitations Info             SPECIAL CARE FACTORS FREQUENCY  PT (By licensed PT), OT (By licensed OT)     PT Frequency: 2x OT Frequency: Please see PT Evaluation            Contractures Contractures Info: Not present    Additional Factors Info  Code Status, Allergies, Psychotropic, Insulin Sliding Scale Code Status Info: Full Code Allergies Info: NKA Psychotropic Info: Please see dc summary Insulin Sliding Scale Info: Please see dc summary       Current Medications (08/15/2023):  This is the current hospital active medication list Current Facility-Administered Medications  Medication Dose Route Frequency Provider Last  Rate Last Admin   bisacodyl (DULCOLAX) EC tablet 5 mg  5 mg Oral Daily PRN Charlane Ferretti, DO       carbidopa-levodopa (SINEMET CR) 50-200 MG per tablet controlled release 1 tablet  1 tablet Oral QHS Charlane Ferretti, DO       carbidopa-levodopa (SINEMET IR) 25-100 MG per tablet immediate release 2 tablet  2 tablet Oral TID Berton Mount I, MD   2 tablet at 08/15/23 1048   cefTRIAXone (ROCEPHIN) 1 g in sodium chloride 0.9 % 100 mL IVPB  1 g Intravenous Q24H Charlane Ferretti, DO        heparin injection 5,000 Units  5,000 Units Subcutaneous Q8H Charlane Ferretti, DO   5,000 Units at 08/15/23 0547   polyethylene glycol (MIRALAX / GLYCOLAX) packet 17 g  17 g Oral Daily PRN Charlane Ferretti, DO       traZODone (DESYREL) tablet 50 mg  50 mg Oral QHS Charlane Ferretti, DO   50 mg at 08/15/23 0227     Discharge Medications: Please see discharge summary for a list of discharge medications.  Relevant Imaging Results:  Relevant Lab Results:   Additional Information SSN: [id number]  Marliss Coots, LCSW

## 2023-08-15 NOTE — Evaluation (Signed)
 Physical Therapy Evaluation Patient Details Name: Theodore King MRN: [id number] DOB: 05-05-1946 Today's Date: 08/15/2023  History of Present Illness  Theodore King is a 78 y.o. male who resides at [facility] who presented to the ER after a fall with a laceration to the right eyebrow. PMH: dementia, Parkinson's, HTN, thrombocytopenia, chronic suprapubic catheter  Clinical Impression  Pt admitted with above. PTA pt resided at [facility] and was able to transfer into w/c and propel self around some at the facility. Pt only oriented to self at baseline due to h/o dementia. Pt participated with PT this date and initiated transfer to EOB however did not initiate or respond to cues to complete sit to stand. Once medically ready pt appropriate to return to [facility] and resume care. Acute PT to cont to follow.        If plan is discharge home, recommend the following:     Can travel by private vehicle   No    Equipment Recommendations None recommended by PT  Recommendations for Other Services       Functional Status Assessment Patient has had a recent decline in their functional status and demonstrates the ability to make significant improvements in function in a reasonable and predictable amount of time.     Precautions / Restrictions Precautions Precautions: Fall Precaution/Restrictions Comments: dementia, parkinson's, supra pubic catheter Restrictions Weight Bearing Restrictions Per Provider Order: No      Mobility  Bed Mobility Overal bed mobility: Needs Assistance Bed Mobility: Supine to Sit, Sit to Supine     Supine to sit: Min assist Sit to supine: Min assist   General bed mobility comments: HOB elevated, pt initiated LE movement, minA for trunk elevation and to scoot to EOB    Transfers                   General transfer comment: pt did not complete sit to stand transfer or initiate when PT asked    Ambulation/Gait               General Gait  Details: unable at baseline  Stairs            Wheelchair Mobility     Tilt Bed    Modified Rankin (Stroke Patients Only)       Balance Overall balance assessment: Needs assistance Sitting-balance support: Feet supported, No upper extremity supported Sitting balance-Leahy Scale: Poor Sitting balance - Comments: pt with posterior lean with prolonged sitting requiring minA to bring pt back to midline                                     Pertinent Vitals/Pain Pain Assessment Pain Assessment: No/denies pain    Home Living Family/patient expects to be discharged to:: Skilled nursing facility                   Additional Comments: pt resident at [facility], plan is to return there    Prior Function Prior Level of Function : Needs assist             Mobility Comments: per RN who talked to [facility], pt was able to transfer in/out of w/c with minimal help and would propel self around ADLs Comments: assist needed for all ADLS     Extremity/Trunk Assessment   Upper Extremity Assessment Upper Extremity Assessment: Generalized weakness    Lower Extremity Assessment Lower Extremity  Assessment: Generalized weakness    Cervical / Trunk Assessment Cervical / Trunk Assessment: Normal  Communication   Communication Communication: No apparent difficulties    Cognition Arousal: Alert (sleeping upon arrival but easily aroused) Behavior During Therapy: Flat affect   PT - Cognitive impairments: History of cognitive impairments                       PT - Cognition Comments: pt with dementia, only oriented to name, did follow simple commands majority of time Following commands: Intact (majority of time, with increased tiem)       Cueing Cueing Techniques: Verbal cues, Tactile cues     General Comments General comments (skin integrity, edema, etc.): VSS, pt able to eat grapes with set up by PT, pt with noted fine motor deficits as pt  dropped a few grapes    Exercises     Assessment/Plan    PT Assessment Patient needs continued PT services  PT Problem List Decreased strength;Decreased activity tolerance;Decreased balance;Decreased mobility       PT Treatment Interventions DME instruction;Gait training;Functional mobility training;Therapeutic activities;Therapeutic exercise    PT Goals (Current goals can be found in the Care Plan section)  Acute Rehab PT Goals Patient Stated Goal: didn't state PT Goal Formulation: Patient unable to participate in goal setting Time For Goal Achievement: 08/29/23 Potential to Achieve Goals: Fair    Frequency Min 2X/week     Co-evaluation               AM-PAC PT "6 Clicks" Mobility  Outcome Measure Help needed turning from your back to your side while in a flat bed without using bedrails?: A Little Help needed moving from lying on your back to sitting on the side of a flat bed without using bedrails?: A Little Help needed moving to and from a bed to a chair (including a wheelchair)?: A Lot Help needed standing up from a chair using your arms (e.g., wheelchair or bedside chair)?: A Lot Help needed to walk in hospital room?: Total Help needed climbing 3-5 steps with a railing? : Total 6 Click Score: 12    End of Session   Activity Tolerance: Patient tolerated treatment well Patient left: in bed;with call bell/phone within reach;with bed alarm set Nurse Communication: Mobility status (need for assist to eat lunch) PT Visit Diagnosis: Unsteadiness on feet (R26.81)    Time: 1610-9604 PT Time Calculation (min) (ACUTE ONLY): 16 min   Charges:   PT Evaluation $PT Eval Low Complexity: 1 Low   PT General Charges $$ ACUTE PT VISIT: 1 Visit         Lewis Shock, PT, DPT Acute Rehabilitation Services Secure chat preferred Office #: [phone]   Iona Hansen 08/15/2023, 2:33 PM

## 2023-08-15 NOTE — TOC Initial Note (Addendum)
 Transition of Care [facility]) - Initial/Assessment Note    Patient Details  Name: Theodore King MRN: [id number] Date of Birth: May 30, 1946  Transition of [facility]) CM/SW Contact:    Marliss Coots, LCSW Phone Number: 08/15/2023, 1:46 PM  Clinical Narrative:                  1:46 PM CSW spoke with patient's daughter/legal guardian, Theodore, regarding disposition. Theodore confirmed patient is to return to [facility] via [facility] transportation when medically ready. CSW relayed the information to SNF admissions.  3:04 PM Per bedside, [facility] CSW, [name], called [phone]).  Expected Discharge Plan: Skilled Nursing Facility Barriers to Discharge: Continued Medical Work up   Patient Goals and CMS Choice            Expected Discharge Plan and Services In-house Referral: Clinical Social Work   Post Acute Care Choice: Skilled Nursing Facility Living arrangements for the past 2 months: Skilled Nursing Facility                                      Prior Living Arrangements/Services Living arrangements for the past 2 months: Skilled Nursing Facility Lives with:: Facility Resident Patient language and need for interpreter reviewed:: Yes        Need for Family Participation in Patient Care: Yes (Comment) Care giver support system in place?: Yes (comment)   Criminal Activity/Legal Involvement Pertinent to Current Situation/Hospitalization: No - Comment as needed  Activities of Daily Living      Permission Sought/Granted Permission sought to share information with : Family Supports, Oceanographer granted to share information with : No (Contact information on chart)  Share Information with NAME: Theodore King  Permission granted to share info w AGENCY: [facility]  Permission granted to share info w Relationship: Daughter  Permission granted to share info w Contact Information: [phone]  Emotional Assessment    Attitude/Demeanor/Rapport: Unable to Assess Affect (typically observed): Unable to Assess   Alcohol / Substance Use: Not Applicable Psych Involvement: No (comment)  Admission diagnosis:  Disorientation [R41.0] Fall [W19.XXXA] Acute cystitis without hematuria [N30.00] Fall, initial encounter [W19.XXXA] Patient Active Problem List   Diagnosis Date Noted   Fall 08/14/2023   Chronic kidney disease, stage 3b (HCC) 07/17/2023   Anemia of chronic disease 07/17/2023   Displacement of Foley catheter, initial encounter (HCC) 07/16/2023   Atelectasis 01/18/2023   Chronic indwelling Foley catheter    Sepsis (HCC) 04/04/2022   Pressure injury of skin 02/03/2022   Urinary retention 02/01/2022   Foley catheter problem, initial encounter (HCC) 01/31/2022   Pulmonary nodule 01/31/2022   Syncope 11/18/2021   UTI (urinary tract infection) 11/12/2021   Essential hypertension 11/11/2021   Bacteremia 11/10/2021   Hypokalemia 11/09/2021   Normocytic anemia 11/09/2021   Benign neoplasm of transverse colon    Abnormal CT of the abdomen    Acute renal failure superimposed on stage 3a chronic kidney disease (HCC) 10/23/2021   Thrombocytopenia (HCC) 10/23/2021   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 10/23/2021   Hyperbilirubinemia 10/23/2021   Colonic mass 10/23/2021   Aortic atherosclerosis (HCC) 10/23/2021   History of 2019 novel coronavirus disease (COVID-19) 04/24/2019   Dysphonia 10/16/2018   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 [place] [address] Phone: [phone] Fax: [phone]     Social Drivers of Health (SDOH) Social History: SDOH Screenings   Food Insecurity: No Food Insecurity (07/17/2023)  Housing: Unknown (07/17/2023)  Transportation Needs: No Transportation Needs (07/17/2023)  Utilities: Not At Risk (07/17/2023)  Social Connections: Unknown (07/17/2023)  Tobacco Use: Medium Risk (08/14/2023)   SDOH Interventions:     Readmission Risk Interventions    07/20/2023    3:48 PM 01/19/2023    2:05 PM 04/08/2022    9:56 AM  Readmission Risk Prevention Plan  Transportation Screening Complete Complete Complete  PCP or Specialist Appt within 3-5 Days Complete Complete   HRI or Home Care Consult Complete Complete   Social Work Consult for Recovery Care Planning/Counseling Complete Complete   Palliative Care Screening Complete Complete   Medication Review Oceanographer) Complete Complete Complete  PCP or Specialist appointment within 3-5 days of discharge   Complete  HRI or Home Care Consult   Complete  SW Recovery Care/Counseling Consult   Complete  Skilled Nursing Facility   Not Applicable

## 2023-08-15 NOTE — Care Management Obs Status (Signed)
 MEDICARE OBSERVATION STATUS NOTIFICATION   Patient Details  Name: DONYAE KILNER MRN: [id number] Date of Birth: April 12, 1946   Medicare Observation Status Notification Given:  Yes    Harriet Masson, RN 08/15/2023, 2:15 PM

## 2023-08-16 DIAGNOSIS — D509 Iron deficiency anemia, unspecified: Secondary | ICD-10-CM | POA: Diagnosis present

## 2023-08-16 DIAGNOSIS — I10 Essential (primary) hypertension: Secondary | ICD-10-CM | POA: Diagnosis not present

## 2023-08-16 DIAGNOSIS — Z809 Family history of malignant neoplasm, unspecified: Secondary | ICD-10-CM | POA: Diagnosis not present

## 2023-08-16 DIAGNOSIS — Z79899 Other long term (current) drug therapy: Secondary | ICD-10-CM | POA: Diagnosis not present

## 2023-08-16 DIAGNOSIS — Z860100 Personal history of colon polyps, unspecified: Secondary | ICD-10-CM | POA: Diagnosis not present

## 2023-08-16 DIAGNOSIS — W19XXXA Unspecified fall, initial encounter: Secondary | ICD-10-CM | POA: Diagnosis present

## 2023-08-16 DIAGNOSIS — G4700 Insomnia, unspecified: Secondary | ICD-10-CM | POA: Diagnosis present

## 2023-08-16 DIAGNOSIS — Z87891 Personal history of nicotine dependence: Secondary | ICD-10-CM | POA: Diagnosis not present

## 2023-08-16 DIAGNOSIS — I129 Hypertensive chronic kidney disease with stage 1 through stage 4 chronic kidney disease, or unspecified chronic kidney disease: Secondary | ICD-10-CM | POA: Diagnosis present

## 2023-08-16 DIAGNOSIS — G9341 Metabolic encephalopathy: Secondary | ICD-10-CM | POA: Diagnosis present

## 2023-08-16 DIAGNOSIS — M4802 Spinal stenosis, cervical region: Secondary | ICD-10-CM | POA: Diagnosis present

## 2023-08-16 DIAGNOSIS — Z8616 Personal history of COVID-19: Secondary | ICD-10-CM | POA: Diagnosis not present

## 2023-08-16 DIAGNOSIS — R8271 Bacteriuria: Secondary | ICD-10-CM | POA: Diagnosis present

## 2023-08-16 DIAGNOSIS — N400 Enlarged prostate without lower urinary tract symptoms: Secondary | ICD-10-CM | POA: Diagnosis present

## 2023-08-16 DIAGNOSIS — Z823 Family history of stroke: Secondary | ICD-10-CM | POA: Diagnosis not present

## 2023-08-16 DIAGNOSIS — Z9359 Other cystostomy status: Secondary | ICD-10-CM | POA: Diagnosis not present

## 2023-08-16 DIAGNOSIS — R410 Disorientation, unspecified: Secondary | ICD-10-CM | POA: Diagnosis present

## 2023-08-16 DIAGNOSIS — G20A1 Parkinson's disease without dyskinesia, without mention of fluctuations: Secondary | ICD-10-CM | POA: Diagnosis present

## 2023-08-16 DIAGNOSIS — N179 Acute kidney failure, unspecified: Secondary | ICD-10-CM | POA: Diagnosis present

## 2023-08-16 DIAGNOSIS — W19XXXD Unspecified fall, subsequent encounter: Secondary | ICD-10-CM | POA: Diagnosis not present

## 2023-08-16 DIAGNOSIS — S01111A Laceration without foreign body of right eyelid and periocular area, initial encounter: Secondary | ICD-10-CM | POA: Diagnosis present

## 2023-08-16 DIAGNOSIS — D631 Anemia in chronic kidney disease: Secondary | ICD-10-CM | POA: Diagnosis present

## 2023-08-16 DIAGNOSIS — Z801 Family history of malignant neoplasm of trachea, bronchus and lung: Secondary | ICD-10-CM | POA: Diagnosis not present

## 2023-08-16 DIAGNOSIS — K5900 Constipation, unspecified: Secondary | ICD-10-CM | POA: Diagnosis present

## 2023-08-16 DIAGNOSIS — N1832 Chronic kidney disease, stage 3b: Secondary | ICD-10-CM | POA: Diagnosis present

## 2023-08-16 DIAGNOSIS — I700 Atherosclerosis of aorta: Secondary | ICD-10-CM | POA: Diagnosis present

## 2023-08-16 DIAGNOSIS — E785 Hyperlipidemia, unspecified: Secondary | ICD-10-CM | POA: Diagnosis present

## 2023-08-16 DIAGNOSIS — F02818 Dementia in other diseases classified elsewhere, unspecified severity, with other behavioral disturbance: Secondary | ICD-10-CM | POA: Diagnosis present

## 2023-08-16 DIAGNOSIS — Y92129 Unspecified place in nursing home as the place of occurrence of the external cause: Secondary | ICD-10-CM | POA: Diagnosis not present

## 2023-08-16 LAB — CBC WITH DIFFERENTIAL/PLATELET
Abs Immature Granulocytes: 0.03 10*3/uL (ref 0.00–0.07)
Basophils Absolute: 0 10*3/uL (ref 0.0–0.1)
Basophils Relative: 0 %
Eosinophils Absolute: 0.1 10*3/uL (ref 0.0–0.5)
Eosinophils Relative: 1 %
HCT: 33.2 % — ABNORMAL LOW (ref 39.0–52.0)
Hemoglobin: 10.9 g/dL — ABNORMAL LOW (ref 13.0–17.0)
Immature Granulocytes: 0 %
Lymphocytes Relative: 15 %
Lymphs Abs: 1.2 10*3/uL (ref 0.7–4.0)
MCH: 27.7 pg (ref 26.0–34.0)
MCHC: 32.8 g/dL (ref 30.0–36.0)
MCV: 84.3 fL (ref 80.0–100.0)
Monocytes Absolute: 0.4 10*3/uL (ref 0.1–1.0)
Monocytes Relative: 5 %
Neutro Abs: 6.3 10*3/uL (ref 1.7–7.7)
Neutrophils Relative %: 79 %
Platelets: 172 10*3/uL (ref 150–400)
RBC: 3.94 MIL/uL — ABNORMAL LOW (ref 4.22–5.81)
RDW: 13.5 % (ref 11.5–15.5)
WBC: 8.1 10*3/uL (ref 4.0–10.5)
nRBC: 0.2 % (ref 0.0–0.2)

## 2023-08-16 LAB — RENAL FUNCTION PANEL
Albumin: 3.5 g/dL (ref 3.5–5.0)
Anion gap: 9 (ref 5–15)
BUN: 35 mg/dL — ABNORMAL HIGH (ref 8–23)
CO2: 21 mmol/L — ABNORMAL LOW (ref 22–32)
Calcium: 9.1 mg/dL (ref 8.9–10.3)
Chloride: 107 mmol/L (ref 98–111)
Creatinine, Ser: 2.15 mg/dL — ABNORMAL HIGH (ref 0.61–1.24)
GFR, Estimated: 31 mL/min — ABNORMAL LOW (ref >60)
Glucose, Bld: 124 mg/dL — ABNORMAL HIGH (ref 70–99)
Phosphorus: 2.8 mg/dL (ref 2.5–4.6)
Potassium: 4.1 mmol/L (ref 3.5–5.1)
Sodium: 137 mmol/L (ref 135–145)

## 2023-08-16 LAB — MAGNESIUM: Magnesium: 1.9 mg/dL (ref 1.7–2.4)

## 2023-08-16 LAB — CULTURE, OB URINE

## 2023-08-16 MED ORDER — SODIUM CHLORIDE 0.9 % IV SOLN: INTRAVENOUS | Status: AC

## 2023-08-16 MED ORDER — PROCHLORPERAZINE EDISYLATE 10 MG/2ML IJ SOLN: 5.0000 mg | Freq: Four times a day (QID) | INTRAMUSCULAR | Status: DC | PRN

## 2023-08-16 MED ORDER — ACETAMINOPHEN 325 MG PO TABS
650.0000 mg | ORAL_TABLET | Freq: Four times a day (QID) | ORAL | Status: DC | PRN
  Administered 2023-08-16 – 2023-08-17 (×2): 650 mg via ORAL
  Filled 2023-08-16 (×2): qty 2

## 2023-08-16 NOTE — Plan of Care (Signed)

## 2023-08-16 NOTE — Progress Notes (Signed)
 PROGRESS NOTE    Theodore King  ZOX:[id number] DOB: November 10, 1945 DOA: 08/14/2023 PCP: [facility]  Outpatient Specialists:     Brief Narrative:  Patient is a 78 year old male with past medical history significant for dementia, Parkinson's, hypertension, thrombocytopenia and chronic suprapubic catheterization.  Patient presented to the emergency room following a fall.  Patient is not able to give any significant history.  Collateral information revealed that patient may have been less responsive than usual.  UA done on presentation revealed large leukocyte esterase, pH of 9, protein of 100, specific gravity of 1.011 and WBC of 11-20.  Triple phosphate crystals was found on urinalysis.  Urine culture is growing multiple species, likely contaminated urine.  Will repeat urine culture, however, patient is already on antibiotics.  CT head revealed mild right lateral orbit soft tissue swelling but no acute intracranial abnormality or acute skull fracture.  Chronic cerebral white matter disease was reported.  CT cervical spine did not reveal any acute traumatic injury to the cervical spine.  Advanced chronic cervical spine degeneration with chronic spinal stenosis at C3-C4 and C4-C5, with carotid atherosclerosis reported.  08/16/2023: Patient seen.  Patient is awake and alert.  No significant history from patient.  Mild worsening of renal function.  Continue IV fluids.  Repeat UA ordered.  Follow repeat urine culture.  Continue IV antibiotics.  Likely discharge in the next 1 to 2 days.    Assessment & Plan:   Principal Problem:   Fall   Abdominal pain: -Resolved.  No complaints of abdominal pain today. -Patient has chronic suprapubic Foley catheter. -Possible UTI is being worked up. -Initial urine culture grew contaminants. -Repeat urine culture and urinalysis are pending.     Concern for complicated UTI: -Urine culture grew multiple species, likely  contaminant. -Repeat urine culture. -Continue IV Rocephin. -Low threshold to exchange suprapubic catheter.     Possible acute metabolic encephalopathy: -Hydrate patient adequately. -Treat possible UTI. -Avoid mind altering medications. -Optimize Parkinson's disease management. -CT head has not shown any significant findings. -Further management depend on hospital course. 08/16/2023: Resolving.  Hypertension: -Reasonably controlled.  Chronic kidney disease stage IIIb: -Stable. -Repeat renal panel in the morning.  Dementia: -No behavioral problems.  Parkinson's disease: -Continue Sinemet.  Anemia: -Hemoglobin of 10.8 g/dL. -Normal MCV (88.5). -Patient is iron deficient. -Relatively low vitamin B12 (379). -Will check folate level.  Volume depletion: -Increase IVF NS to 75 cc/hour -Renal panel and magnesium level in am.    DVT prophylaxis: Subcutaneous heparin Code Status: Code Family Communication:  Disposition Plan: Observation.   Consultants:  None.  Procedures:  None.  Antimicrobials:  IV Rocephin.   Subjective: No history from patient.  Objective: Vitals:   08/15/23 2022 08/15/23 2355 08/16/23 0448 08/16/23 0745  BP: (!) 112/56 (!) 168/99 (!) 151/77 133/66  Pulse: (!) 59 73 82 78  Resp: 18 18 18 18   Temp: 97.7 F (36.5 C)  98.2 F (36.8 C) 98.4 F (36.9 C)  TempSrc: Oral  Oral Oral  SpO2: 100%  93% 100%    Intake/Output Summary (Last 24 hours) at 08/16/2023 1057 Last data filed at 08/16/2023 0700 Gross per 24 hour  Intake 767.23 ml  Output 200 ml  Net 567.23 ml   There were no vitals filed for this visit.  Examination:  General exam: Appears calm and comfortable.  Patient is awake and alert. Respiratory system: Clear to auscultation.  Cardiovascular system: S1 & S2, soft systolic murmur and increased intensity  of S2 component of the heart sound. Gastrointestinal system: Abdomen is soft  Central nervous system: Awake and alert.   Moves extremities     Data Reviewed: I have personally reviewed following labs and imaging studies  CBC: Recent Labs  Lab 08/14/23 1139 08/15/23 0617 08/16/23 0527  WBC 7.5 8.4 8.1  NEUTROABS 5.6  --  6.3  HGB 10.4* 10.8* 10.9*  HCT 33.7* 34.5* 33.2*  MCV 88.0 88.5 84.3  PLT 174 159 172   Basic Metabolic Panel: Recent Labs  Lab 08/14/23 1139 08/15/23 0617 08/16/23 0527  NA 138 138 137  K 4.4 4.4 4.1  CL 105 106 107  CO2 25 21* 21*  GLUCOSE 100* 81 124*  BUN 28* 29* 35*  CREATININE 2.07* 1.86* 2.15*  CALCIUM 9.2 9.2 9.1  MG  --   --  1.9  PHOS  --   --  2.8   GFR: CrCl cannot be calculated (Unknown ideal weight.). Liver Function Tests: Recent Labs  Lab 08/14/23 1139 08/15/23 0617 08/16/23 0527  AST 13* 15  --   ALT 5 10  --   ALKPHOS 64 66  --   BILITOT 1.6* 1.9*  --   PROT 7.3 7.1  --   ALBUMIN 3.5 3.4* 3.5   Recent Labs  Lab 08/14/23 1139  LIPASE 40   No results for input(s): "AMMONIA" in the last 168 hours. Coagulation Profile: No results for input(s): "INR", "PROTIME" in the last 168 hours. Cardiac Enzymes: No results for input(s): "CKTOTAL", "CKMB", "CKMBINDEX", "TROPONINI" in the last 168 hours. BNP (last 3 results) No results for input(s): "PROBNP" in the last 8760 hours. HbA1C: No results for input(s): "HGBA1C" in the last 72 hours. CBG: No results for input(s): "GLUCAP" in the last 168 hours. Lipid Profile: No results for input(s): "CHOL", "HDL", "LDLCALC", "TRIG", "CHOLHDL", "LDLDIRECT" in the last 72 hours. Thyroid Function Tests: No results for input(s): "TSH", "T4TOTAL", "FREET4", "T3FREE", "THYROIDAB" in the last 72 hours. Anemia Panel: No results for input(s): "VITAMINB12", "FOLATE", "FERRITIN", "TIBC", "IRON", "RETICCTPCT" in the last 72 hours. Urine analysis:    Component Value Date/Time   COLORURINE AMBER (A) 08/14/2023 1206   APPEARANCEUR CLOUDY (A) 08/14/2023 1206   LABSPEC 1.011 08/14/2023 1206   PHURINE 9.0 (H)  08/14/2023 1206   GLUCOSEU NEGATIVE 08/14/2023 1206   HGBUR NEGATIVE 08/14/2023 1206   BILIRUBINUR NEGATIVE 08/14/2023 1206   KETONESUR NEGATIVE 08/14/2023 1206   PROTEINUR 100 (A) 08/14/2023 1206   UROBILINOGEN 1.0 04/16/2020 2048   NITRITE NEGATIVE 08/14/2023 1206   LEUKOCYTESUR LARGE (A) 08/14/2023 1206   Sepsis Labs: @LABRCNTIP (procalcitonin:4,lacticidven:4)  ) Recent Results (from the past 240 hours)  Urine Culture     Status: Abnormal   Collection Time: 08/14/23 12:06 PM   Specimen: Urine, Random  Result Value Ref Range Status   Specimen Description URINE, RANDOM  Final   Special Requests   Final    NONE Reflexed from [id number] Performed at [facility], [address], [place], [place] [address]    Culture MULTIPLE SPECIES PRESENT, SUGGEST RECOLLECTION (A)  Final   Report Status 08/15/2023 FINAL  Final         Radiology Studies: MR Brain Wo Contrast (neuro protocol) Result Date: 08/14/2023 CLINICAL DATA:  Neuro deficit, acute, stroke suspected. Fall. Laceration. EXAM: MRI HEAD WITHOUT CONTRAST TECHNIQUE: Multiplanar, multiecho pulse sequences of the brain and surrounding structures were obtained without intravenous contrast. COMPARISON:  CT head without contrast 08/14/2023. MR head without contrast 09/04/2022.  FINDINGS: Brain: No acute infarct, hemorrhage, or mass lesion is present. Mild periventricular white matter changes bilaterally are stable. Deep brain nuclei are within normal limits. The ventricles are of normal size. No significant extraaxial fluid collection is present. The brainstem and cerebellum are within normal limits. The internal auditory canals are within normal limits. Midline structures are within normal limits. Vascular: Flow is present in the major intracranial arteries. Skull and upper cervical spine: The craniocervical junction is normal. Upper cervical spine is within normal limits. Marrow signal is unremarkable. Sinuses/Orbits: The paranasal sinuses  and mastoid air cells are clear. Bilateral lens replacements are noted. Globes and orbits are otherwise unremarkable. IMPRESSION: 1. No acute intracranial abnormality or significant interval change. 2. Stable mild periventricular white matter disease. This likely reflects the sequela of chronic microvascular ischemia. Electronically Signed   By: Marin Roberts M.D.   On: 08/14/2023 17:40   CT Cervical Spine Wo Contrast Result Date: 08/14/2023 CLINICAL DATA:  78 year old male status post fall. Eye brow laceration. EXAM: CT CERVICAL SPINE WITHOUT CONTRAST TECHNIQUE: Multidetector CT imaging of the cervical spine was performed without intravenous contrast. Multiplanar CT image reconstructions were also generated. RADIATION DOSE REDUCTION: This exam was performed according to the departmental dose-optimization program which includes automated exposure control, adjustment of the mA and/or kV according to patient size and/or use of iterative reconstruction technique. COMPARISON:  Cervical spine CT 11/17/2022. Head CT today reported separately. FINDINGS: Alignment: Improved cervical lordosis. Cervicothoracic junction alignment is within normal limits. Bilateral posterior element alignment is within normal limits. Skull base and vertebrae: Bone mineralization is within normal limits for age. Visualized skull base is intact. No atlanto-occipital dissociation. C1 and C2 are severely degenerated, but appears stable, intact. No acute osseous abnormality identified. Soft tissues and spinal canal: No prevertebral fluid or swelling. No visible canal hematoma. Extensive carotid calcified atherosclerosis throughout the bilateral neck. Negative visible noncontrast neck soft tissues otherwise. 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 Superimposed chronic C1-C2 instability. Multifactorial chronic cervical spinal stenosis at C3-C4 and C4-C5 is probably stable from a 2022 MRI (please see that  report) Upper chest: Visible upper thoracic levels appear intact. Negative lung apices. IMPRESSION: 1. No acute traumatic injury identified in the cervical spine. 2. Advanced chronic cervical spine degeneration with chronic spinal stenosis at C3-C4 and C4-C5. 3. Carotid atherosclerosis. Electronically Signed   By: Odessa Fleming M.D.   On: 08/14/2023 11:52   CT Head Wo Contrast Result Date: 08/14/2023 CLINICAL DATA:  78 year old male status post fall. Eye brow laceration. EXAM: CT HEAD WITHOUT CONTRAST TECHNIQUE: Contiguous axial images were obtained from the base of the skull through the vertex without intravenous contrast. RADIATION DOSE REDUCTION: This exam was performed according to the departmental dose-optimization program which includes automated exposure control, adjustment of the mA and/or kV according to patient size and/or use of iterative reconstruction technique. COMPARISON:  Brain MRI 09/04/2022.  Head CT 11/17/2022. FINDINGS: Brain: Stable cerebral volume. Stable dural calcifications. No midline shift, ventriculomegaly, mass effect, evidence of mass lesion, intracranial hemorrhage or evidence of cortically based acute infarction. Patchy moderate for age periventricular white matter hypodensity, gray-white differentiation is stable. Vascular: Calcified atherosclerosis at the skull base. No suspicious intracranial vascular hyperdensity. Skull: No acute osseous abnormality identified. Sinuses/Orbits: Visualized paranasal sinuses and mastoids are stable and well aerated. Other: Mild right lateral orbit soft tissue swelling series 3, image 35. No other acute orbit or scalp soft tissue injury identified. IMPRESSION: 1. Mild right lateral orbit soft tissue  swelling. 2. No acute intracranial abnormality or acute skull fracture identified. Chronic cerebral white matter disease. Electronically Signed   By: Odessa Fleming M.D.   On: 08/14/2023 11:25        Scheduled Meds:  carbidopa-levodopa  1 tablet Oral QHS    carbidopa-levodopa  2 tablet Oral TID   heparin  5,000 Units Subcutaneous Q8H   traZODone  50 mg Oral QHS   Continuous Infusions:  sodium chloride     cefTRIAXone (ROCEPHIN)  IV 1 g (08/15/23 1710)     LOS: 0 days    Time spent: 35 minutes    Berton Mount, MD  [facility] Pager #: [phone] 7PM-7AM contact night coverage as above

## 2023-08-17 DIAGNOSIS — I10 Essential (primary) hypertension: Secondary | ICD-10-CM | POA: Diagnosis not present

## 2023-08-17 DIAGNOSIS — Y92129 Unspecified place in nursing home as the place of occurrence of the external cause: Secondary | ICD-10-CM | POA: Diagnosis not present

## 2023-08-17 DIAGNOSIS — W19XXXA Unspecified fall, initial encounter: Secondary | ICD-10-CM | POA: Diagnosis not present

## 2023-08-17 LAB — RENAL FUNCTION PANEL
Albumin: 3.1 g/dL — ABNORMAL LOW (ref 3.5–5.0)
Anion gap: 4 — ABNORMAL LOW (ref 5–15)
BUN: 36 mg/dL — ABNORMAL HIGH (ref 8–23)
CO2: 21 mmol/L — ABNORMAL LOW (ref 22–32)
Calcium: 8.8 mg/dL — ABNORMAL LOW (ref 8.9–10.3)
Chloride: 111 mmol/L (ref 98–111)
Creatinine, Ser: 2.69 mg/dL — ABNORMAL HIGH (ref 0.61–1.24)
GFR, Estimated: 23 mL/min — ABNORMAL LOW (ref >60)
Glucose, Bld: 94 mg/dL (ref 70–99)
Phosphorus: 2.8 mg/dL (ref 2.5–4.6)
Potassium: 4.4 mmol/L (ref 3.5–5.1)
Sodium: 136 mmol/L (ref 135–145)

## 2023-08-17 LAB — MAGNESIUM: Magnesium: 1.9 mg/dL (ref 1.7–2.4)

## 2023-08-17 MED ORDER — ALBUTEROL SULFATE (2.5 MG/3ML) 0.083% IN NEBU: 2.5000 mg | INHALATION_SOLUTION | Freq: Four times a day (QID) | RESPIRATORY_TRACT | Status: DC | PRN

## 2023-08-17 MED ORDER — AMLODIPINE BESYLATE 10 MG PO TABS
10.0000 mg | ORAL_TABLET | Freq: Every day | ORAL | Status: DC
  Administered 2023-08-17 – 2023-08-18 (×2): 10 mg via ORAL
  Filled 2023-08-17 (×2): qty 1

## 2023-08-17 MED ORDER — LACTATED RINGERS IV SOLN: INTRAVENOUS | Status: DC

## 2023-08-17 NOTE — Plan of Care (Signed)
  Problem: Education: Goal: Knowledge of General Education information will improve Description: Including pain rating scale, medication(s)/side effects and non-pharmacologic comfort measures 08/17/2023 1851 by Lacretia Nicks D, LPN Outcome: Progressing 08/17/2023 1841 by Lacretia Nicks D, LPN Outcome: Progressing   Problem: Health Behavior/Discharge Planning: Goal: Ability to manage health-related needs will improve 08/17/2023 1851 by Lacretia Nicks D, LPN Outcome: Progressing 08/17/2023 1841 by Lacretia Nicks D, LPN Outcome: Progressing   Problem: Clinical Measurements: Goal: Ability to maintain clinical measurements within normal limits will improve 08/17/2023 1851 by Lacretia Nicks D, LPN Outcome: Progressing 08/17/2023 1841 by Lacretia Nicks D, LPN Outcome: Progressing Goal: Will remain free from infection 08/17/2023 1851 by Lacretia Nicks D, LPN Outcome: Progressing 08/17/2023 1841 by Lacretia Nicks D, LPN Outcome: Progressing Goal: Diagnostic test results will improve 08/17/2023 1851 by Lacretia Nicks D, LPN Outcome: Progressing 08/17/2023 1841 by Lacretia Nicks D, LPN Outcome: Progressing Goal: Respiratory complications will improve 08/17/2023 1851 by Lacretia Nicks D, LPN Outcome: Progressing 08/17/2023 1841 by Lacretia Nicks D, LPN Outcome: Progressing Goal: Cardiovascular complication will be avoided 08/17/2023 1851 by Lacretia Nicks D, LPN Outcome: Progressing 08/17/2023 1841 by Lacretia Nicks D, LPN Outcome: Progressing   Problem: Activity: Goal: Risk for activity intolerance will decrease 08/17/2023 1851 by Lacretia Nicks D, LPN Outcome: Progressing 08/17/2023 1841 by Lacretia Nicks D, LPN Outcome: Progressing   Problem: Nutrition: Goal: Adequate nutrition will be maintained 08/17/2023 1851 by Lacretia Nicks D, LPN Outcome: Progressing 08/17/2023 1841 by Lacretia Nicks D, LPN Outcome: Progressing   Problem: Coping: Goal: Level  of anxiety will decrease 08/17/2023 1851 by Lacretia Nicks D, LPN Outcome: Progressing 08/17/2023 1841 by Lacretia Nicks D, LPN Outcome: Progressing   Problem: Elimination: Goal: Will not experience complications related to bowel motility 08/17/2023 1851 by Lacretia Nicks D, LPN Outcome: Progressing 08/17/2023 1841 by Lacretia Nicks D, LPN Outcome: Progressing Goal: Will not experience complications related to urinary retention 08/17/2023 1851 by Lacretia Nicks D, LPN Outcome: Progressing 08/17/2023 1841 by Lacretia Nicks D, LPN Outcome: Progressing   Problem: Pain Managment: Goal: General experience of comfort will improve and/or be controlled 08/17/2023 1851 by Lacretia Nicks D, LPN Outcome: Progressing 08/17/2023 1841 by Lacretia Nicks D, LPN Outcome: Progressing   Problem: Safety: Goal: Ability to remain free from injury will improve 08/17/2023 1851 by Lacretia Nicks D, LPN Outcome: Progressing 08/17/2023 1841 by Lacretia Nicks D, LPN Outcome: Progressing   Problem: Skin Integrity: Goal: Risk for impaired skin integrity will decrease 08/17/2023 1851 by Lacretia Nicks D, LPN Outcome: Progressing 08/17/2023 1841 by Lacretia Nicks D, LPN Outcome: Progressing

## 2023-08-17 NOTE — Progress Notes (Signed)
 Progress Note   Patient: Theodore King ZOX:[id number] DOB: 1945/10/23 DOA: 08/14/2023     1 DOS: the patient was seen and examined on 08/17/2023   Brief hospital course: 77yo with h/o Parkinson's with dementia (has legal guardian), HTN, thrombocytopenia, and chronic suprapubic catheter who presented on 3/9 from [place] with a fall.  Unremarkable evaluation other than concern for complicated UTI.  He is medically stable for discharge back to [place].  Assessment and Plan:  Fall Patient with apparent fall at his facility  Concern for complicated UTI as the cause, but he appears to have bacteriuria instead Current issue is AKI Hopefully ready to return to [facility] on 3/13    Concern for complicated UTI Urine culture grew multiple species, likely contaminant, x 2 Patient has chronic suprapubic Foley catheter Will stop Ceftriaxone given low current suspicion for active infection    AKI on stage 3b CKD Baseline stage 3b (approaching stage 4) CKD Creatinine 2.69/GFR 23, up from 2.07/32 on 3/9 Will continue to hydrate overnight and recheck in AM   Possible acute metabolic encephalopathy vs. Underlying dementia CT head has not shown any significant findings Concern for MCI as the cause (known dementia) rather than acute issues Delirium precautions   Hypertension BP 133/66-165/74 Resume amlodipine   Parkinson's disease Continue Sinemet   Anemia Stable, likely chronic disease-associated   Once AKI is improved, he will be stable to return to [place].      Consultants: PT TOC team   Procedures: None   Antibiotics: Ceftriaxone 3/9-12  30 Day Unplanned Readmission Risk Score    Flowsheet Row ED to Hosp-Admission (Current) from 08/14/2023 in [facility] GENERAL MED/SURG UNIT  30 Day Unplanned Readmission Risk Score (%) 25.36 Filed at 08/17/2023 0801       This score is the patient's risk of an unplanned readmission within 30 days of being discharged (0  -100%). The score is based on dignosis, age, lab data, medications, orders, and past utilization.   Low:  0-14.9   Medium: 15-21.9   High: 22-29.9   Extreme: 30 and above           Subjective: No specific concerns.   Objective: Vitals:   08/17/23 0348 08/17/23 0752  BP: (!) 135/58 (!) 134/45  Pulse: 61 (!) 54  Resp: 18   Temp: 98.1 F (36.7 C) 98 F (36.7 C)  SpO2: 100% 100%    Intake/Output Summary (Last 24 hours) at 08/17/2023 1702 Last data filed at 08/16/2023 2136 Gross per 24 hour  Intake 120 ml  Output --  Net 120 ml   There were no vitals filed for this visit.  Exam:  General:  Appears calm and comfortable and is in NAD Eyes:  EOMI, normal lids, iris ENT:  grossly normal hearing, lips & tongue, mmm Neck:  no LAD, masses or thyromegaly Cardiovascular:  RRR, no m/r/g. No LE edema.  Respiratory:   CTA bilaterally with no wheezes/rales/rhonchi.  Normal respiratory effort. Abdomen:  soft, NT, ND; + suprapubic catheter Skin:  no rash or induration seen on limited exam Musculoskeletal:  grossly normal tone BUE/BLE, good ROM, no bony abnormality Psychiatric:  blunted mood and affect, speech sparse but appropriate Neurologic:  CN 2-12 grossly intact, moves all extremities in coordinated fashion  Data Reviewed: I have reviewed the patient's lab results since admission.  Pertinent labs for today include:   BUN 36/Creatinine 2.69/GFR 23; 29/2.07/32 on 3/9 Albumin 3.1 WBC 8.1  Hgb 10.9     Family Communication:  None present; I left a voice mail for his daughter  Disposition: Status is: Inpatient Remains inpatient appropriate because: AKI     Time spent: 50 minutes  Unresulted Labs (From admission, onward)     Start     Ordered   08/18/23 0500  CBC with Differential/Platelet  Tomorrow morning,   R        08/17/23 0829   08/18/23 0500  Basic metabolic panel  Tomorrow morning,   R        08/17/23 1610             Author: Jonah Blue,  MD 08/17/2023 5:02 PM  For on call review [email or web address].

## 2023-08-17 NOTE — Plan of Care (Signed)
 Patient is cooperative and progressing towards goals.

## 2023-08-17 NOTE — TOC Progression Note (Signed)
 Transition of Care [facility]) - Progression Note    Patient Details  Name: Theodore King MRN: [id number] Date of Birth: 11-01-1945  Transition of Care [facility]) CM/SW Contact  Marliss Coots, LCSW Phone Number: 08/17/2023, 9:28 AM  Clinical Narrative:     9:28 AM Per hospitalist, patient is not medically ready to discharge today due to AKI. Patient is able to return to [facility] via [facility] when medically ready for discharge.  Expected Discharge Plan: Skilled Nursing Facility Barriers to Discharge: Continued Medical Work up  Expected Discharge Plan and Services In-house Referral: Clinical Social Work   Post Acute Care Choice: Skilled Nursing Facility Living arrangements for the past 2 months: Skilled Nursing Facility                                       Social Determinants of Health (SDOH) Interventions SDOH Screenings   Food Insecurity: Patient Unable To Answer (08/15/2023)  Housing: Patient Unable To Answer (08/15/2023)  Transportation Needs: Patient Unable To Answer (08/15/2023)  Utilities: Patient Unable To Answer (08/15/2023)  Social Connections: Patient Unable To Answer (08/15/2023)  Tobacco Use: Medium Risk (08/14/2023)    Readmission Risk Interventions    07/20/2023    3:48 PM 01/19/2023    2:05 PM 04/08/2022    9:56 AM  Readmission Risk Prevention Plan  Transportation Screening Complete Complete Complete  PCP or Specialist Appt within 3-5 Days Complete Complete   HRI or Home Care Consult Complete Complete   Social Work Consult for Recovery Care Planning/Counseling Complete Complete   Palliative Care Screening Complete Complete   Medication Review Oceanographer) Complete Complete Complete  PCP or Specialist appointment within 3-5 days of discharge   Complete  HRI or Home Care Consult   Complete  SW Recovery Care/Counseling Consult   Complete  Skilled Nursing Facility   Not Applicable

## 2023-08-17 NOTE — Hospital Course (Signed)
 78yo with h/o Parkinson's with dementia (has legal guardian), HTN, thrombocytopenia, and chronic suprapubic catheter who presented on 3/9 from [place] with a fall.  Unremarkable evaluation other than concern for complicated UTI.  He is medically stable for discharge back to [place].

## 2023-08-18 DIAGNOSIS — W19XXXA Unspecified fall, initial encounter: Secondary | ICD-10-CM | POA: Diagnosis not present

## 2023-08-18 DIAGNOSIS — Y92129 Unspecified place in nursing home as the place of occurrence of the external cause: Secondary | ICD-10-CM | POA: Diagnosis not present

## 2023-08-18 DIAGNOSIS — I10 Essential (primary) hypertension: Secondary | ICD-10-CM | POA: Diagnosis not present

## 2023-08-18 LAB — BASIC METABOLIC PANEL
Anion gap: 9 (ref 5–15)
BUN: 27 mg/dL — ABNORMAL HIGH (ref 8–23)
CO2: 21 mmol/L — ABNORMAL LOW (ref 22–32)
Calcium: 9.1 mg/dL (ref 8.9–10.3)
Chloride: 110 mmol/L (ref 98–111)
Creatinine, Ser: 1.81 mg/dL — ABNORMAL HIGH (ref 0.61–1.24)
GFR, Estimated: 38 mL/min — ABNORMAL LOW (ref >60)
Glucose, Bld: 105 mg/dL — ABNORMAL HIGH (ref 70–99)
Potassium: 4.4 mmol/L (ref 3.5–5.1)
Sodium: 140 mmol/L (ref 135–145)

## 2023-08-18 LAB — CBC WITH DIFFERENTIAL/PLATELET
Abs Immature Granulocytes: 0.02 10*3/uL (ref 0.00–0.07)
Basophils Absolute: 0 10*3/uL (ref 0.0–0.1)
Basophils Relative: 1 %
Eosinophils Absolute: 0.2 10*3/uL (ref 0.0–0.5)
Eosinophils Relative: 3 %
HCT: 32.7 % — ABNORMAL LOW (ref 39.0–52.0)
Hemoglobin: 10.6 g/dL — ABNORMAL LOW (ref 13.0–17.0)
Immature Granulocytes: 0 %
Lymphocytes Relative: 23 %
Lymphs Abs: 1.4 10*3/uL (ref 0.7–4.0)
MCH: 27.6 pg (ref 26.0–34.0)
MCHC: 32.4 g/dL (ref 30.0–36.0)
MCV: 85.2 fL (ref 80.0–100.0)
Monocytes Absolute: 0.4 10*3/uL (ref 0.1–1.0)
Monocytes Relative: 7 %
Neutro Abs: 3.9 10*3/uL (ref 1.7–7.7)
Neutrophils Relative %: 66 %
Platelets: 163 10*3/uL (ref 150–400)
RBC: 3.84 MIL/uL — ABNORMAL LOW (ref 4.22–5.81)
RDW: 13.7 % (ref 11.5–15.5)
WBC: 5.9 10*3/uL (ref 4.0–10.5)
nRBC: 0 % (ref 0.0–0.2)

## 2023-08-18 MED ORDER — SENNOSIDES-DOCUSATE SODIUM 8.6-50 MG PO TABS: 1.0000 | ORAL_TABLET | Freq: Every evening | ORAL | Status: AC | PRN

## 2023-08-18 MED ORDER — BISACODYL 5 MG PO TBEC
5.0000 mg | DELAYED_RELEASE_TABLET | Freq: Every day | ORAL | Status: AC | PRN
Start: 2023-08-18 — End: ?

## 2023-08-18 NOTE — Plan of Care (Signed)
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
## 2023-08-18 NOTE — TOC Transition Note (Signed)
 Transition of Care [facility]) - Discharge Note   Patient Details  Name: Theodore King MRN: [id number] Date of Birth: July 01, 1945  [facility]) CM/SW Contact:  Marliss Coots, LCSW Phone Number: 08/18/2023, 1:37 PM   Clinical Narrative:     Patient will DC to: [facility] Anticipated DC date: 08/18/2023 Family notified: Abdalrahman Clementson; Daughter; [phone] Transport by: [facility] transportation   Per MD patient ready for DC to  [facility]. RN to call report prior to discharge [phone]). RN, patient's family, and facility notified of DC (patient disoriented x3). Discharge Summary and FL2 sent to facility. DC packet on chart. [facility] transportation requested for patient.   CSW will sign off for now as social work intervention is no longer needed. Please consult [place] again if new needs arise.    Final next level of care: Skilled Nursing Facility Barriers to Discharge: Barriers Resolved   Patient Goals and CMS Choice            Discharge Placement              Patient chooses bed at: [facility] Patient to be transferred to facility by: [facility] Name of family member notified: Brayln Duque; Daughter; [phone] Patient and family notified of of transfer: 08/18/23  Discharge Plan and Services Additional resources added to the After Visit Summary for   In-house Referral: Clinical Social Work   Post Acute Care Choice: Skilled Nursing Facility                               Social Drivers of Health (SDOH) Interventions SDOH Screenings   Food Insecurity: Patient Unable To Answer (08/15/2023)  Housing: Patient Unable To Answer (08/15/2023)  Transportation Needs: Patient Unable To Answer (08/15/2023)  Utilities: Patient Unable To Answer (08/15/2023)  Social Connections: Patient Unable To Answer (08/15/2023)  Tobacco Use: Medium Risk (08/14/2023)     Readmission Risk Interventions    07/20/2023    3:48 PM 01/19/2023     2:05 PM 04/08/2022    9:56 AM  Readmission Risk Prevention Plan  Transportation Screening Complete Complete Complete  PCP or Specialist Appt within 3-5 Days Complete Complete   HRI or Home Care Consult Complete Complete   Social Work Consult for Recovery Care Planning/Counseling Complete Complete   Palliative Care Screening Complete Complete   Medication Review Oceanographer) Complete Complete Complete  PCP or Specialist appointment within 3-5 days of discharge   Complete  HRI or Home Care Consult   Complete  SW Recovery Care/Counseling Consult   Complete  Skilled Nursing Facility   Not Applicable

## 2023-08-24 ENCOUNTER — Other Ambulatory Visit: Payer: Self-pay | Admitting: Interventional Radiology

## 2023-08-24 DIAGNOSIS — Z01818 Encounter for other preprocedural examination: Secondary | ICD-10-CM

## 2023-08-24 NOTE — H&P (Shared)
 Chief Complaint: Patient was seen in consultation today for urinary retention in the setting of neurogenic bladder, with consideration for suprapubic catheter upsizing, and at the request of Dr. Stephania Fragmin.  Referring Physician(s): Dr. Stephania Fragmin, MD   Supervising Physician: Ruel Favors  Patient Status: [facility] - Out-pt  History of Present Illness: Theodore King is a 78 y.o. male with PMHx notable for HLD, hyperglycemia, Parkinson's disease, BPH, paralysis agitans, REM sleep behavior disorder, vitamin B deficiency, and urinary retention with chronic indwelling catheter.  Patient is know to IR service, having most recently undergone Suprapubic catheter placement on 2/10, with Dr. Deanne Coffer.  Initially, his foley catheter was inadvertently removed as his care facility and he was referred to [facility] ED for evaluation.  Replacement of foley catheter was unsuccessful due to penile erosion.  Urology recommending suprapubic catheter placement.   Patient presents today for suprapubic catheter upsizing.  *** All labs and medications are within acceptable parameters. No pertinent allergies. Patient has been NPO since midnight.    Currently without any significant complaints. Patient alert and laying in bed,calm. Denies any fevers, headache, chest pain, SOB, cough, abdominal pain, nausea, vomiting or bleeding.  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    SOB (shortness of breath) 03/23/2018   Unspecified vitamin D deficiency    Unspecified vitamin D deficiency    Vertigo, benign paroxysmal 11/05/2016   Vitamin D deficiency 05/18/2013    Past Surgical History:  Procedure Laterality Date   APPENDECTOMY     COLONOSCOPY WITH PROPOFOL N/A 10/26/2021   Procedure: COLONOSCOPY WITH PROPOFOL;  Surgeon: Hilarie Fredrickson, MD;  Location: [facility];  Service: Gastroenterology;  Laterality: N/A;   POLYPECTOMY  10/26/2021   Procedure: POLYPECTOMY;  Surgeon: Hilarie Fredrickson, MD;  Location: [facility];  Service: Gastroenterology;;   ROTATOR CUFF REPAIR     right    Allergies: Patient has no known allergies.  Medications: Prior to Admission medications   Medication Sig Start Date End Date Taking? Authorizing Provider  acetaminophen (TYLENOL) 500 MG tablet Take 1,000 mg by mouth 2 (two) times daily.    [provider]  albuterol (VENTOLIN HFA) 108 (90 Base) MCG/ACT inhaler Inhale 2 puffs into the lungs every 6 (six) hours as needed for wheezing.    [provider]  amLODipine (NORVASC) 10 MG tablet Take 1 tablet (10 mg total) by mouth daily. 07/20/23   Amin, Ankit C, MD  bisacodyl (DULCOLAX) 5 MG EC tablet Take 1 tablet (5 mg total) by mouth daily as needed for moderate constipation. 08/18/23   Jonah Blue, MD  carbidopa-levodopa (SINEMET IR) 25-100 MG tablet Take 2 tablets at 7 AM, 2 tablets at 10 AM, 2 tablets at 2 PM and 1 tablet at 6 PM Patient taking differently: Take 2 tablets by mouth 3 (three) times daily. Take 2 tablets in the a.m; 2 tablets in the afternoon; and 2 tablets at night/before bed. 09/24/21   Penumalli,  Glenford Bayley, MD  Cholecalciferol 1.25 MG (50000 UT) capsule Take 50,000 Units by mouth daily.    [provider]  glycopyrrolate (ROBINUL) 1 MG tablet Take 1 tablet (1 mg total) by mouth 2 (two) times daily. 09/24/21   Penumalli, Glenford Bayley, MD  OVER THE COUNTER MEDICATION Take 120 mLs by mouth See admin  instructions. MedPass- Drink 120 ml's by mouth once a day if Ensure is not available    [provider]  oxyCODONE (OXY IR/ROXICODONE) 5 MG immediate release tablet Take 1 tablet (5 mg total) by mouth every 6 (six) hours as needed for severe pain (pain score 7-10). 07/19/23   Amin, Ankit C, MD  senna-docusate (SENOKOT-S) 8.6-50 MG tablet Take 1 tablet by mouth at bedtime as needed for mild constipation. 08/18/23   Jonah Blue, MD  traZODone (DESYREL) 50 MG tablet Take 50 mg by mouth at bedtime.    [provider]  Vitamin D, Ergocalciferol, (DRISDOL) 1.25 MG (50000 UNIT) CAPS capsule Take 50,000 Units by mouth See admin instructions. Take 50,000 units by mouth on the 3rd of every month    [provider]     Family History  Problem Relation Age of Onset   Cancer Mother    Cancer Father    Stroke Brother    Cancer Sister    Healthy Daughter    Cancer Sister    Lung cancer Brother    Healthy Daughter    Colon cancer Neg Hx    Esophageal cancer Neg Hx    Rectal cancer Neg Hx    Stomach cancer Neg Hx     Social History   Socioeconomic History   Marital status: Single    Spouse name: Not on file   Number of children: 3   Years of education: 11th   Highest education level: 11th grade  Occupational History    Employer: RETIRED  Tobacco Use   Smoking status: Former    Current packs/day: 0.00    Average packs/day: 0.5 packs/day for 20.8 years (10.4 ttl pk-yrs)    Types: Cigarettes    Start date: 08/14/1953    Quit date: 05/23/1974    Years since quitting: 49.2   Smokeless tobacco: Never  Vaping Use   Vaping status: Never Used  Substance and Sexual Activity   Alcohol use: Not Currently   Drug use: No   Sexual activity: Never  Other Topics Concern   Not on file  Social History Narrative   09/24/21 lives with dgtr [name]   Social Drivers of Health   Financial Resource Strain: Not on file  Food Insecurity: Patient Unable To Answer (08/15/2023)    Hunger Vital Sign    Worried About Running Out of Food in the Last Year: Patient unable to answer    Ran Out of Food in the Last Year: Patient unable to answer  Transportation Needs: Patient Unable To Answer (08/15/2023)   PRAPARE - Transportation    Lack of Transportation (Medical): Patient unable to answer    Lack of Transportation (Non-Medical): Patient unable to answer  Physical Activity: Not on file  Stress: Not on file  Social Connections: Patient Unable To Answer (08/15/2023)   Social Connection and Isolation Panel [NHANES]    Frequency of Communication with Friends and Family: Patient unable to answer    Frequency of Social Gatherings with Friends and Family: Patient unable to answer    Attends Religious Services: Patient unable to answer    Active Member of Clubs  or Organizations: Patient unable to answer    Attends Club or Organization Meetings: Patient unable to answer    Marital Status: Patient unable to answer    Review of Systems: A 12 point ROS discussed and pertinent positives are indicated in the HPI above.  All other systems are negative.  Review of Systems  Vital Signs: There were no vitals taken for this visit.  Advance Care Plan: {Advance Care ZOXW:[id number]}    Physical Exam  Imaging: MR Brain Wo Contrast (neuro protocol) Result Date: 08/14/2023 CLINICAL DATA:  Neuro deficit, acute, stroke suspected. Fall. Laceration. EXAM: MRI HEAD WITHOUT CONTRAST TECHNIQUE: Multiplanar, multiecho pulse sequences of the brain and surrounding structures were obtained without intravenous contrast. COMPARISON:  CT head without contrast 08/14/2023. MR head without contrast 09/04/2022. FINDINGS: Brain: No acute infarct, hemorrhage, or mass lesion is present. Mild periventricular white matter changes bilaterally are stable. Deep brain nuclei are within normal limits. The ventricles are of normal size. No significant extraaxial fluid collection is present. The brainstem and cerebellum are  within normal limits. The internal auditory canals are within normal limits. Midline structures are within normal limits. Vascular: Flow is present in the major intracranial arteries. Skull and upper cervical spine: The craniocervical junction is normal. Upper cervical spine is within normal limits. Marrow signal is unremarkable. Sinuses/Orbits: The paranasal sinuses and mastoid air cells are clear. Bilateral lens replacements are noted. Globes and orbits are otherwise unremarkable. IMPRESSION: 1. No acute intracranial abnormality or significant interval change. 2. Stable mild periventricular white matter disease. This likely reflects the sequela of chronic microvascular ischemia. Electronically Signed   By: Marin Roberts M.D.   On: 08/14/2023 17:40   CT Cervical Spine Wo Contrast Result Date: 08/14/2023 CLINICAL DATA:  78 year old male status post fall. Eye brow laceration. EXAM: CT CERVICAL SPINE WITHOUT CONTRAST TECHNIQUE: Multidetector CT imaging of the cervical spine was performed without intravenous contrast. Multiplanar CT image reconstructions were also generated. RADIATION DOSE REDUCTION: This exam was performed according to the departmental dose-optimization program which includes automated exposure control, adjustment of the mA and/or kV according to patient size and/or use of iterative reconstruction technique. COMPARISON:  Cervical spine CT 11/17/2022. Head CT today reported separately. FINDINGS: Alignment: Improved cervical lordosis. Cervicothoracic junction alignment is within normal limits. Bilateral posterior element alignment is within normal limits. Skull base and vertebrae: Bone mineralization is within normal limits for age. Visualized skull base is intact. No atlanto-occipital dissociation. C1 and C2 are severely degenerated, but appears stable, intact. No acute osseous abnormality identified. Soft tissues and spinal canal: No prevertebral fluid or swelling. No visible canal hematoma.  Extensive carotid calcified atherosclerosis throughout the bilateral neck. Negative visible noncontrast neck soft tissues otherwise. 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 Superimposed chronic C1-C2 instability. Multifactorial chronic cervical spinal stenosis at C3-C4 and C4-C5 is probably stable from a 2022 MRI (please see that report) Upper chest: Visible upper thoracic levels appear intact. Negative lung apices. IMPRESSION: 1. No acute traumatic injury identified in the cervical spine. 2. Advanced chronic cervical spine degeneration with chronic spinal stenosis at C3-C4 and C4-C5. 3. Carotid atherosclerosis. Electronically Signed   By: Odessa Fleming M.D.   On: 08/14/2023 11:52   CT Head Wo Contrast Result Date: 08/14/2023 CLINICAL DATA:  78 year old male status post fall. Eye brow laceration. EXAM: CT HEAD WITHOUT CONTRAST TECHNIQUE: Contiguous axial images were obtained from the base of the skull through the vertex without intravenous contrast. RADIATION DOSE REDUCTION: This exam was performed  according to the departmental dose-optimization program which includes automated exposure control, adjustment of the mA and/or kV according to patient size and/or use of iterative reconstruction technique. COMPARISON:  Brain MRI 09/04/2022.  Head CT 11/17/2022. FINDINGS: Brain: Stable cerebral volume. Stable dural calcifications. No midline shift, ventriculomegaly, mass effect, evidence of mass lesion, intracranial hemorrhage or evidence of cortically based acute infarction. Patchy moderate for age periventricular white matter hypodensity, gray-white differentiation is stable. Vascular: Calcified atherosclerosis at the skull base. No suspicious intracranial vascular hyperdensity. Skull: No acute osseous abnormality identified. Sinuses/Orbits: Visualized paranasal sinuses and mastoids are stable and well aerated. Other: Mild right lateral orbit soft tissue swelling series 3, image 35. No  other acute orbit or scalp soft tissue injury identified. IMPRESSION: 1. Mild right lateral orbit soft tissue swelling. 2. No acute intracranial abnormality or acute skull fracture identified. Chronic cerebral white matter disease. Electronically Signed   By: Odessa Fleming M.D.   On: 08/14/2023 11:25    Labs:  CBC: Recent Labs    08/14/23 1139 08/15/23 0617 08/16/23 0527 08/18/23 0932  WBC 7.5 8.4 8.1 5.9  HGB 10.4* 10.8* 10.9* 10.6*  HCT 33.7* 34.5* 33.2* 32.7*  PLT 174 159 172 163    COAGS: Recent Labs    09/04/22 1539 01/19/23 0948 07/17/23 0812  INR 1.2 1.3* 1.2  APTT 41* 35  --     BMP: Recent Labs    08/15/23 0617 08/16/23 0527 08/17/23 0435 08/18/23 0932  NA 138 137 136 140  K 4.4 4.1 4.4 4.4  CL 106 107 111 110  CO2 21* 21* 21* 21*  GLUCOSE 81 124* 94 105*  BUN 29* 35* 36* 27*  CALCIUM 9.2 9.1 8.8* 9.1  CREATININE 1.86* 2.15* 2.69* 1.81*  GFRNONAA 36* 31* 23* 38*    LIVER FUNCTION TESTS: Recent Labs    01/18/23 1940 01/20/23 0953 07/18/23 0445 08/14/23 1139 08/15/23 0617 08/16/23 0527 08/17/23 0435  BILITOT 1.7*  --  2.2* 1.6* 1.9*  --   --   AST 13*  --  22 13* 15  --   --   ALT 5  --  5 5 10   --   --   ALKPHOS 84  --  63 64 66  --   --   PROT 6.8  --  7.1 7.3 7.1  --   --   ALBUMIN 3.0*   < > 3.1* 3.5 3.4* 3.5 3.1*   < > = values in this interval not displayed.    TUMOR MARKERS: No results for input(s): "AFPTM", "CEA", "CA199", "CHROMGRNA" in the last 8760 hours.  Assessment and Plan: Patient is know to IR service, having most recently undergone suprapubic catheter placement on 2/10, with Dr. Deanne Coffer.  Initially, his foley catheter was inadvertently removed as his care facility and he was referred to [facility] ED for evaluation.  Replacement of foley catheter was unsuccessful due to penile erosion.  Urology recommending suprapubic catheter placement.   Today, patient presents for suprapubic catheter upsizing.  Risks and benefits of suprapubic  catheter up-sizing were discussed with the patient including bleeding, infection, damage to adjacent structures, bladder perforation/fistula connection, and sepsis.  All of the patient's questions were answered, patient is agreeable to proceed.  Consent signed and in chart.     Thank you for this interesting consult.  I greatly enjoyed meeting VANDY TSUCHIYA and look forward to participating in their care.  A copy of this report was sent to the requesting provider on  this date.  Electronically Signed: Sable Feil, PA-C 08/24/2023, 12:55 PM   I spent a total of  25 Minutes in face to face in clinical consultation, greater than 50% of which was counseling/coordinating care for urinary retention in the setting of neurogenic bladder, with consideration for suprapubic catheter upsizing, and at the request of Dr. Stephania Fragmin.

## 2023-08-25 ENCOUNTER — Inpatient Hospital Stay (HOSPITAL_COMMUNITY)
Admit: 2023-08-25 | Discharge: 2023-08-25 | Disposition: A | Discharge status: Home / Self Care | Payer: Medicare (Managed Care) | Attending: Radiology | Admitting: Radiology

## 2023-08-25 ENCOUNTER — Inpatient Hospital Stay (HOSPITAL_COMMUNITY)
Admit: 2023-08-25 | Discharge: 2023-08-25 | Disposition: A | Discharge status: Home / Self Care | Payer: Medicare (Managed Care)

## 2023-09-05 ENCOUNTER — Other Ambulatory Visit: Payer: Self-pay | Admitting: Radiology

## 2023-09-05 DIAGNOSIS — R339 Retention of urine, unspecified: Secondary | ICD-10-CM

## 2023-09-05 NOTE — H&P (Signed)
 Chief Complaint: Urinary retention; s/p suprapubic catheter placement 07/18/23; now referred for exchange/upsizing of SP catheter  Referring Provider(s): Shaloub,G  Supervising Physician: Roanna Banning  Patient Status: [facility] - Out-pt  History of Present Illness: Theodore King is a 78 y.o. 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 He presents today for image guided suprapubic cath exchange/upsizing.   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   SOB (shortness of breath) 03/23/2018   Unspecified vitamin D deficiency    Unspecified vitamin D deficiency    Vertigo, benign paroxysmal 11/05/2016   Vitamin D deficiency 05/18/2013    Past Surgical History:  Procedure Laterality Date   APPENDECTOMY     COLONOSCOPY WITH PROPOFOL N/A 10/26/2021   Procedure: COLONOSCOPY WITH PROPOFOL;  Surgeon: Hilarie Fredrickson, MD;  Location: [facility];  Service: Gastroenterology;  Laterality: N/A;   POLYPECTOMY  10/26/2021   Procedure: POLYPECTOMY;  Surgeon: Hilarie Fredrickson, MD;  Location: [facility];  Service: Gastroenterology;;   ROTATOR CUFF REPAIR     right    Allergies: Patient has no known allergies.  Medications: Prior to Admission medications   Medication Sig Start Date End Date Taking?  Authorizing Provider  acetaminophen (TYLENOL) 500 MG tablet Take 1,000 mg by mouth 2 (two) times daily.    [provider]  albuterol (VENTOLIN HFA) 108 (90 Base) MCG/ACT inhaler Inhale 2 puffs into the lungs every 6 (six) hours as needed for wheezing.    [provider]  amLODipine (NORVASC) 10 MG tablet Take 1 tablet (10 mg total) by mouth daily. 07/20/23   Amin, Ankit C, MD  bisacodyl (DULCOLAX) 5 MG EC tablet Take 1 tablet (5 mg total) by mouth daily as needed for moderate constipation. 08/18/23   Jonah Blue, MD  carbidopa-levodopa (SINEMET IR) 25-100 MG tablet Take 2 tablets at 7 AM, 2 tablets at 10 AM, 2 tablets at 2 PM and 1 tablet at 6 PM Patient taking differently: Take 2 tablets by mouth 3 (three) times daily. Take 2 tablets in the a.m; 2 tablets in the afternoon; and 2 tablets at night/before bed. 09/24/21   Penumalli, Glenford Bayley, MD  Cholecalciferol 1.25 MG (50000 UT) capsule Take 50,000 Units by mouth daily.    [provider]  glycopyrrolate (ROBINUL) 1 MG tablet Take 1 tablet (1 mg total) by mouth 2 (two) times daily. 09/24/21   Penumalli, Glenford Bayley, MD  OVER THE COUNTER MEDICATION Take 120 mLs by mouth See admin instructions. MedPass- Drink 120 ml's by mouth once a day if Ensure is not available    [provider]  oxyCODONE (OXY IR/ROXICODONE) 5 MG immediate release tablet Take 1 tablet (5 mg total) by mouth every 6 (six) hours as needed for severe pain (pain score 7-10). 07/19/23   Miguel Rota, MD  senna-docusate (SENOKOT-S) 8.6-50 MG tablet Take 1 tablet by mouth at bedtime as needed for mild constipation. 08/18/23   Jonah Blue, MD  traZODone (DESYREL) 50 MG tablet Take 50 mg by mouth at bedtime.    [provider]  Vitamin D, Ergocalciferol, (DRISDOL) 1.25 MG (50000 UNIT) CAPS capsule Take 50,000 Units by mouth See admin instructions. Take 50,000 units by mouth on the 3rd of every month    [provider]     Family History   Problem Relation Age of Onset   Cancer Mother    Cancer Father    Stroke Brother    Cancer Sister    Healthy Daughter    Cancer Sister    Lung cancer Brother    Healthy Daughter    Colon cancer Neg Hx    Esophageal cancer Neg Hx    Rectal cancer Neg Hx    Stomach cancer Neg Hx     Social History   Socioeconomic History   Marital status: Single    Spouse name: Not on file   Number of children: 3   Years of education: 11th   Highest education level: 11th grade  Occupational History    Employer: RETIRED  Tobacco Use   Smoking status: Former    Current packs/day: 0.00    Average packs/day: 0.5 packs/day for 20.8 years (10.4 ttl pk-yrs)    Types: Cigarettes    Start date: 08/14/1953    Quit date: 05/23/1974    Years since quitting: 49.3   Smokeless tobacco: Never  Vaping Use   Vaping status: Never Used  Substance and Sexual Activity   Alcohol use: Not Currently   Drug use: No   Sexual activity: Never  Other Topics Concern   Not on file  Social History Narrative   09/24/21 lives with dgtr [name]   Social Drivers of Health   Financial Resource Strain: Not on file  Food Insecurity: Patient Unable To Answer (08/15/2023)   Hunger Vital Sign    Worried About Running Out of Food in the Last Year: Patient unable to answer    Ran Out of Food in the Last Year: Patient unable to answer  Transportation Needs: Patient Unable To Answer (08/15/2023)   PRAPARE - Transportation    Lack of Transportation (Medical): Patient unable to answer    Lack of Transportation (Non-Medical): Patient unable to answer  Physical Activity: Not on file  Stress: Not on file  Social Connections: Patient Unable To Answer (08/15/2023)   Social Connection and Isolation Panel [NHANES]    Frequency of Communication with Friends and Family: Patient unable to answer    Frequency of Social Gatherings with Friends and Family: Patient unable to answer    Attends Religious Services: Patient unable to answer     Active Member of Clubs or Organizations: Patient unable to answer    Attends Banker Meetings: Patient unable to answer    Marital Status: Patient unable to answer      Review of Systems  Vital Signs:   Advance Care Plan: no documents on file.  Physical Exam  Imaging: MR Brain Wo Contrast (neuro protocol) Result Date: 08/14/2023 CLINICAL DATA:  Neuro deficit, acute, stroke suspected. Fall. Laceration. EXAM: MRI HEAD WITHOUT CONTRAST TECHNIQUE: Multiplanar, multiecho pulse sequences of the brain and surrounding structures were obtained without intravenous contrast. COMPARISON:  CT head without contrast 08/14/2023. MR head without contrast 09/04/2022. FINDINGS: Brain: No acute infarct, hemorrhage, or mass lesion is present. Mild periventricular  white matter changes bilaterally are stable. Deep brain nuclei are within normal limits. The ventricles are of normal size. No significant extraaxial fluid collection is present. The brainstem and cerebellum are within normal limits. The internal auditory canals are within normal limits. Midline structures are within normal limits. Vascular: Flow is present in the major intracranial arteries. Skull and upper cervical spine: The craniocervical junction is normal. Upper cervical spine is within normal limits. Marrow signal is unremarkable. Sinuses/Orbits: The paranasal sinuses and mastoid air cells are clear. Bilateral lens replacements are noted. Globes and orbits are otherwise unremarkable. IMPRESSION: 1. No acute intracranial abnormality or significant interval change. 2. Stable mild periventricular white matter disease. This likely reflects the sequela of chronic microvascular ischemia. Electronically Signed   By: Marin Roberts M.D.   On: 08/14/2023 17:40   CT Cervical Spine Wo Contrast Result Date: 08/14/2023 CLINICAL DATA:  78 year old male status post fall. Eye brow laceration. EXAM: CT CERVICAL SPINE WITHOUT CONTRAST TECHNIQUE:  Multidetector CT imaging of the cervical spine was performed without intravenous contrast. Multiplanar CT image reconstructions were also generated. RADIATION DOSE REDUCTION: This exam was performed according to the departmental dose-optimization program which includes automated exposure control, adjustment of the mA and/or kV according to patient size and/or use of iterative reconstruction technique. COMPARISON:  Cervical spine CT 11/17/2022. Head CT today reported separately. FINDINGS: Alignment: Improved cervical lordosis. Cervicothoracic junction alignment is within normal limits. Bilateral posterior element alignment is within normal limits. Skull base and vertebrae: Bone mineralization is within normal limits for age. Visualized skull base is intact. No atlanto-occipital dissociation. C1 and C2 are severely degenerated, but appears stable, intact. No acute osseous abnormality identified. Soft tissues and spinal canal: No prevertebral fluid or swelling. No visible canal hematoma. Extensive carotid calcified atherosclerosis throughout the bilateral neck. Negative visible noncontrast neck soft tissues otherwise. 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 Superimposed chronic C1-C2 instability. Multifactorial chronic cervical spinal stenosis at C3-C4 and C4-C5 is probably stable from a 2022 MRI (please see that report) Upper chest: Visible upper thoracic levels appear intact. Negative lung apices. IMPRESSION: 1. No acute traumatic injury identified in the cervical spine. 2. Advanced chronic cervical spine degeneration with chronic spinal stenosis at C3-C4 and C4-C5. 3. Carotid atherosclerosis. Electronically Signed   By: Odessa Fleming M.D.   On: 08/14/2023 11:52   CT Head Wo Contrast Result Date: 08/14/2023 CLINICAL DATA:  78 year old male status post fall. Eye brow laceration. EXAM: CT HEAD WITHOUT CONTRAST TECHNIQUE: Contiguous axial images were obtained from the base of the skull  through the vertex without intravenous contrast. RADIATION DOSE REDUCTION: This exam was performed according to the departmental dose-optimization program which includes automated exposure control, adjustment of the mA and/or kV according to patient size and/or use of iterative reconstruction technique. COMPARISON:  Brain MRI 09/04/2022.  Head CT 11/17/2022. FINDINGS: Brain: Stable cerebral volume. Stable dural calcifications. No midline shift, ventriculomegaly, mass effect, evidence of mass lesion, intracranial hemorrhage or evidence of cortically based acute infarction. Patchy moderate for age periventricular white matter hypodensity, gray-white differentiation is stable. Vascular: Calcified atherosclerosis at the skull base. No suspicious intracranial vascular hyperdensity. Skull: No acute osseous abnormality identified. Sinuses/Orbits: Visualized paranasal sinuses and mastoids are stable and well aerated. Other: Mild right lateral orbit soft tissue swelling series 3, image 35. No other acute orbit or scalp soft tissue injury identified. IMPRESSION: 1. Mild right lateral orbit soft tissue swelling. 2. No acute intracranial abnormality or acute skull fracture identified. Chronic cerebral  white matter disease. Electronically Signed   By: Odessa Fleming M.D.   On: 08/14/2023 11:25    Labs:  CBC: Recent Labs    08/14/23 1139 08/15/23 0617 08/16/23 0527 08/18/23 0932  WBC 7.5 8.4 8.1 5.9  HGB 10.4* 10.8* 10.9* 10.6*  HCT 33.7* 34.5* 33.2* 32.7*  PLT 174 159 172 163    COAGS: Recent Labs    01/19/23 0948 07/17/23 0812  INR 1.3* 1.2  APTT 35  --     BMP: Recent Labs    08/15/23 0617 08/16/23 0527 08/17/23 0435 08/18/23 0932  NA 138 137 136 140  K 4.4 4.1 4.4 4.4  CL 106 107 111 110  CO2 21* 21* 21* 21*  GLUCOSE 81 124* 94 105*  BUN 29* 35* 36* 27*  CALCIUM 9.2 9.1 8.8* 9.1  CREATININE 1.86* 2.15* 2.69* 1.81*  GFRNONAA 36* 31* 23* 38*    LIVER FUNCTION TESTS: Recent Labs     01/18/23 1940 01/20/23 0953 07/18/23 0445 08/14/23 1139 08/15/23 0617 08/16/23 0527 08/17/23 0435  BILITOT 1.7*  --  2.2* 1.6* 1.9*  --   --   AST 13*  --  22 13* 15  --   --   ALT 5  --  5 5 10   --   --   ALKPHOS 84  --  63 64 66  --   --   PROT 6.8  --  7.1 7.3 7.1  --   --   ALBUMIN 3.0*   < > 3.1* 3.5 3.4* 3.5 3.1*   < > = values in this interval not displayed.    TUMOR MARKERS: No results for input(s): "AFPTM", "CEA", "CA199", "CHROMGRNA" in the last 8760 hours.  Assessment and Plan: 78 y.o. 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 He presents today for image guided suprapubic cath exchange/upsizing. Details/risks of procedure, incl but not limited to, internal bleeding, infection, injury to adjacent structures d/w pt's family with their understanding and consent.    Thank you for allowing our service to participate in Theodore King 's care.  Electronically Signed: D. Jeananne Rama, PA-C   09/05/2023, 1:59 PM      I spent a total of 20 minutes    in face to face in clinical consultation, greater than 50% of which was counseling/coordinating care for image guided suprapubic catheter exchange/upsizing

## 2023-09-06 ENCOUNTER — Ambulatory Visit (HOSPITAL_COMMUNITY)
Admission: RE | Admit: 2023-09-06 | Discharge: 2023-09-06 | Disposition: A | Discharge status: Home / Self Care | Payer: Medicare (Managed Care) | Source: Ambulatory Visit | Attending: Radiology | Admitting: Radiology

## 2023-09-06 ENCOUNTER — Ambulatory Visit (HOSPITAL_COMMUNITY)
Admission: RE | Admit: 2023-09-06 | Discharge: 2023-09-06 | Disposition: A | Discharge status: Home / Self Care | Payer: Medicare (Managed Care) | Source: Ambulatory Visit | Attending: Neurology | Admitting: Neurology

## 2023-09-06 ENCOUNTER — Other Ambulatory Visit: Payer: Self-pay

## 2023-09-06 ENCOUNTER — Encounter (HOSPITAL_COMMUNITY): Payer: Self-pay

## 2023-09-06 DIAGNOSIS — D631 Anemia in chronic kidney disease: Secondary | ICD-10-CM | POA: Insufficient documentation

## 2023-09-06 DIAGNOSIS — G20A1 Parkinson's disease without dyskinesia, without mention of fluctuations: Secondary | ICD-10-CM | POA: Diagnosis not present

## 2023-09-06 DIAGNOSIS — E785 Hyperlipidemia, unspecified: Secondary | ICD-10-CM | POA: Insufficient documentation

## 2023-09-06 DIAGNOSIS — F0280 Dementia in other diseases classified elsewhere without behavioral disturbance: Secondary | ICD-10-CM | POA: Diagnosis not present

## 2023-09-06 DIAGNOSIS — Z8616 Personal history of COVID-19: Secondary | ICD-10-CM | POA: Insufficient documentation

## 2023-09-06 DIAGNOSIS — N401 Enlarged prostate with lower urinary tract symptoms: Secondary | ICD-10-CM | POA: Insufficient documentation

## 2023-09-06 DIAGNOSIS — N189 Chronic kidney disease, unspecified: Secondary | ICD-10-CM | POA: Diagnosis not present

## 2023-09-06 DIAGNOSIS — I129 Hypertensive chronic kidney disease with stage 1 through stage 4 chronic kidney disease, or unspecified chronic kidney disease: Secondary | ICD-10-CM | POA: Insufficient documentation

## 2023-09-06 DIAGNOSIS — Z01818 Encounter for other preprocedural examination: Secondary | ICD-10-CM

## 2023-09-06 DIAGNOSIS — R339 Retention of urine, unspecified: Secondary | ICD-10-CM | POA: Diagnosis present

## 2023-09-06 DIAGNOSIS — Z466 Encounter for fitting and adjustment of urinary device: Secondary | ICD-10-CM | POA: Insufficient documentation

## 2023-09-06 HISTORY — PX: IR CATHETER TUBE CHANGE: IMG717

## 2023-09-06 LAB — BASIC METABOLIC PANEL WITH GFR
Anion gap: 8 (ref 5–15)
BUN: 31 mg/dL — ABNORMAL HIGH (ref 8–23)
CO2: 21 mmol/L — ABNORMAL LOW (ref 22–32)
Calcium: 9.1 mg/dL (ref 8.9–10.3)
Chloride: 107 mmol/L (ref 98–111)
Creatinine, Ser: 1.9 mg/dL — ABNORMAL HIGH (ref 0.61–1.24)
GFR, Estimated: 35 mL/min — ABNORMAL LOW (ref >60)
Glucose, Bld: 86 mg/dL (ref 70–99)
Potassium: 5 mmol/L (ref 3.5–5.1)
Sodium: 136 mmol/L (ref 135–145)

## 2023-09-06 LAB — CBC
HCT: 35.5 % — ABNORMAL LOW (ref 39.0–52.0)
Hemoglobin: 10.8 g/dL — ABNORMAL LOW (ref 13.0–17.0)
MCH: 27.6 pg (ref 26.0–34.0)
MCHC: 30.4 g/dL (ref 30.0–36.0)
MCV: 90.6 fL (ref 80.0–100.0)
Platelets: 156 10*3/uL (ref 150–400)
RBC: 3.92 MIL/uL — ABNORMAL LOW (ref 4.22–5.81)
RDW: 13.7 % (ref 11.5–15.5)
WBC: 7.8 10*3/uL (ref 4.0–10.5)
nRBC: 0 % (ref 0.0–0.2)

## 2023-09-06 LAB — PROTIME-INR
INR: 1.1 (ref 0.8–1.2)
Prothrombin Time: 14.1 s (ref 11.4–15.2)

## 2023-09-06 MED ORDER — IOHEXOL 300 MG/ML  SOLN
50.0000 mL | Freq: Once | INTRAMUSCULAR | Status: AC | PRN
  Administered 2023-09-06: 20 mL

## 2023-09-06 MED ORDER — SODIUM CHLORIDE 0.9 % IV SOLN
2.0000 g | Freq: Once | INTRAVENOUS | Status: AC
  Administered 2023-09-06: 2 g via INTRAVENOUS
  Filled 2023-09-06: qty 20

## 2023-09-06 MED ORDER — MIDAZOLAM HCL 2 MG/2ML IJ SOLN
INTRAMUSCULAR | Status: AC
Start: 2023-09-06 — End: ?
  Filled 2023-09-06: qty 2

## 2023-09-06 MED ORDER — LIDOCAINE VISCOUS HCL 2 % MT SOLN
OROMUCOSAL | Status: AC
  Filled 2023-09-06: qty 15

## 2023-09-06 MED ORDER — FENTANYL CITRATE (PF) 100 MCG/2ML IJ SOLN
INTRAMUSCULAR | Status: AC | PRN
  Administered 2023-09-06: 50 ug via INTRAVENOUS

## 2023-09-06 MED ORDER — MIDAZOLAM HCL 2 MG/2ML IJ SOLN
INTRAMUSCULAR | Status: AC | PRN
  Administered 2023-09-06: 1 mg via INTRAVENOUS

## 2023-09-06 MED ORDER — SODIUM CHLORIDE 0.9 % IV SOLN
INTRAVENOUS | Status: AC
  Filled 2023-09-06: qty 20

## 2023-09-06 MED ORDER — SODIUM CHLORIDE 0.9% FLUSH: 5.0000 mL | Freq: Three times a day (TID) | INTRAVENOUS | Status: DC

## 2023-09-06 MED ORDER — SODIUM CHLORIDE 0.9 % IV SOLN: INTRAVENOUS | Status: DC

## 2023-09-06 MED ORDER — POTASSIUM CHLORIDE IN NACL 20-0.9 MEQ/L-% IV SOLN
INTRAVENOUS | Status: DC
  Filled 2023-09-06: qty 1000

## 2023-09-06 MED ORDER — LIDOCAINE HCL 1 % IJ SOLN
INTRAMUSCULAR | Status: AC
  Filled 2023-09-06: qty 20

## 2023-09-06 MED ORDER — FENTANYL CITRATE (PF) 100 MCG/2ML IJ SOLN
INTRAMUSCULAR | Status: AC | PRN
Start: 2023-09-06 — End: 2023-09-06
  Administered 2023-09-06: 50 ug via INTRAVENOUS

## 2023-09-06 MED ORDER — LIDOCAINE HCL 1 % IJ SOLN
20.0000 mL | Freq: Once | INTRAMUSCULAR | Status: AC
  Administered 2023-09-06: 10 mL via INTRADERMAL

## 2023-09-06 MED ORDER — FENTANYL CITRATE (PF) 100 MCG/2ML IJ SOLN
INTRAMUSCULAR | Status: AC
  Filled 2023-09-06: qty 2

## 2023-09-06 NOTE — Progress Notes (Signed)
 1130 Spoke with the Scientist, research (physical sciences) for Shelley to review medications and get other medical history information.  She also assisted in getting his POA to call. 1140 Spoke with daughter,POA, she will wait for a call from the PA to give consent over the telephone for the procedure today.

## 2023-09-06 NOTE — Discharge Instructions (Signed)
Discharge Instructions:   Please call Interventional Radiology clinic [phone] with any questions or concerns.  You may remove your dressing and shower tomorrow.      Moderate Conscious Sedation, Adult, Care After This sheet gives you information about how to care for yourself after your procedure. Your health care provider may also give you more specific instructions. If you have problems or questions, contact your health care provider. What can I expect after the procedure? After the procedure, it is common to have: Sleepiness for several hours. Impaired judgment for several hours. Difficulty with balance. Vomiting if you eat too soon. Follow these instructions at home: For the time period you were told by your health care provider:  Rest. Do not participate in activities where you could fall or become injured. Do not drive or use machinery. Do not drink alcohol. Do not take sleeping pills or medicines that cause drowsiness. Do not make important decisions or sign legal documents. Do not take care of children on your own. Eating and drinking  Follow the diet recommended by your health care provider. Drink enough fluid to keep your urine pale yellow. If you vomit: Drink water, juice, or soup when you can drink without vomiting. Make sure you have little or no nausea before eating solid foods. General instructions Take over-the-counter and prescription medicines only as told by your health care provider. Have a responsible adult stay with you for the time you are told. It is important to have someone help care for you until you are awake and alert. Do not smoke. Keep all follow-up visits as told by your health care provider. This is important. Contact a health care provider if: You are still sleepy or having trouble with balance after 24 hours. You feel light-headed. You keep feeling nauseous or you keep vomiting. You develop a rash. You have a fever. You have redness  or swelling around the IV site. Get help right away if: You have trouble breathing. You have new-onset confusion at home. Summary After the procedure, it is common to feel sleepy, have impaired judgment, or feel nauseous if you eat too soon. Rest after you get home. Know the things you should not do after the procedure. Follow the diet recommended by your health care provider and drink enough fluid to keep your urine pale yellow. Get help right away if you have trouble breathing or new-onset confusion at home. This information is not intended to replace advice given to you by your health care provider. Make sure you discuss any questions you have with your health care provider. Document Revised: 09/21/2019 Document Reviewed: 04/19/2019 Elsevier Patient Education  2023 Elsevier Inc.  

## 2023-09-06 NOTE — Progress Notes (Signed)
 [address] Theodore King daughter [place] [phone].

## 2023-09-06 NOTE — Procedures (Signed)
 Vascular and Interventional Radiology Procedure Note  Patient: Theodore King DOB: 1945/06/24 Medical Record Number: [id number] Note Date/Time: 09/06/23 12:42 PM   Performing Physician: Roanna Banning, MD Assistant(s): None  Diagnosis: Routine exchange. and upsize to Council catheter  Procedure: SUPRAPUBIC CYSTOSTOMY TUBE UPSIZE and EXCHANGE  Anesthesia: Conscious Sedation Complications: None Estimated Blood Loss: Minimal  Findings:  Successful exchange and upsize to a 84F Council suprapubic cystostomy tube under fluoroscopy.  Plan: Pt to follow up with Urology for routine SP catheter exchanges.   See detailed procedure note with images in PACS. The patient tolerated the procedure well without incident or complication and was returned to Recovery in stable condition.    Roanna Banning, MD Vascular and Interventional Radiology [facility] Radiology   Pager. [phone] Clinic. [phone]

## 2023-09-06 NOTE — Progress Notes (Signed)
 1145 NS hung not NS with potassium for the procedure.

## 2024-01-17 ENCOUNTER — Emergency Department (HOSPITAL_COMMUNITY): Payer: Medicare (Managed Care)

## 2024-01-17 ENCOUNTER — Encounter (HOSPITAL_COMMUNITY): Payer: Self-pay

## 2024-01-17 ENCOUNTER — Other Ambulatory Visit: Payer: Self-pay

## 2024-01-17 ENCOUNTER — Emergency Department (HOSPITAL_COMMUNITY)
Admission: EM | Admit: 2024-01-17 | Discharge: 2024-01-17 | Disposition: A | Discharge status: Home / Self Care | Payer: Medicare (Managed Care) | Attending: Emergency Medicine | Admitting: Emergency Medicine

## 2024-01-17 DIAGNOSIS — T83091A Other mechanical complication of indwelling urethral catheter, initial encounter: Secondary | ICD-10-CM | POA: Insufficient documentation

## 2024-01-17 DIAGNOSIS — F0390 Unspecified dementia without behavioral disturbance: Secondary | ICD-10-CM | POA: Insufficient documentation

## 2024-01-17 DIAGNOSIS — T839XXA Unspecified complication of genitourinary prosthetic device, implant and graft, initial encounter: Secondary | ICD-10-CM

## 2024-01-17 DIAGNOSIS — Z87891 Personal history of nicotine dependence: Secondary | ICD-10-CM | POA: Diagnosis not present

## 2024-01-17 DIAGNOSIS — Y732 Prosthetic and other implants, materials and accessory gastroenterology and urology devices associated with adverse incidents: Secondary | ICD-10-CM | POA: Diagnosis not present

## 2024-01-17 DIAGNOSIS — G20C Parkinsonism, unspecified: Secondary | ICD-10-CM | POA: Insufficient documentation

## 2024-01-17 HISTORY — PX: IR CYSTOSTOMY TUBE CHANGE COMPLICATED W IMG: IMG1098

## 2024-01-17 MED ORDER — LIDOCAINE HCL 1 % IJ SOLN
INTRAMUSCULAR | Status: AC
  Filled 2024-01-17: qty 20

## 2024-01-17 MED ORDER — LIDOCAINE HCL 1 % IJ SOLN
20.0000 mL | Freq: Once | INTRAMUSCULAR | Status: AC
  Administered 2024-01-17 (×2): 10 mL via INTRADERMAL
  Filled 2024-01-17: qty 20

## 2024-01-17 MED ORDER — FENTANYL CITRATE (PF) 100 MCG/2ML IJ SOLN
INTRAMUSCULAR | Status: AC
  Filled 2024-01-17: qty 2

## 2024-01-17 MED ORDER — LIDOCAINE HCL 1 % IJ SOLN
INTRAMUSCULAR | Status: AC
Start: 2024-01-17 — End: 2024-01-17
  Filled 2024-01-17: qty 20

## 2024-01-17 MED ORDER — IOHEXOL 300 MG/ML  SOLN
50.0000 mL | Freq: Once | INTRAMUSCULAR | Status: AC | PRN
  Administered 2024-01-17 (×2): 15 mL

## 2024-01-17 MED ORDER — LIDOCAINE VISCOUS HCL 2 % MT SOLN
OROMUCOSAL | Status: AC
  Filled 2024-01-17: qty 15

## 2024-01-17 MED ORDER — MIDAZOLAM HCL 2 MG/2ML IJ SOLN
INTRAMUSCULAR | Status: AC
  Filled 2024-01-17: qty 2

## 2024-01-17 MED ORDER — MIDAZOLAM HCL 2 MG/2ML IJ SOLN
INTRAMUSCULAR | Status: AC | PRN
Start: 2024-01-17 — End: 2024-01-17
  Administered 2024-01-17: 1 mg via INTRAVENOUS
  Administered 2024-01-17 (×2): .5 mg via INTRAVENOUS
  Administered 2024-01-17: 1 mg via INTRAVENOUS

## 2024-01-17 MED ORDER — FENTANYL CITRATE (PF) 100 MCG/2ML IJ SOLN
INTRAMUSCULAR | Status: AC | PRN
  Administered 2024-01-17: 50 ug via INTRAVENOUS
  Administered 2024-01-17: 25 ug via INTRAVENOUS
  Administered 2024-01-17: 50 ug via INTRAVENOUS
  Administered 2024-01-17: 25 ug via INTRAVENOUS

## 2024-01-17 NOTE — ED Triage Notes (Signed)
 Patient BIB [facility] from [facility] where they report his suprapubic catheter has been removed, unsure when this occurred. Patient has dementia and is not providing history.  BP 172/74 HR 62 98% RA RR 18

## 2024-01-17 NOTE — Discharge Instructions (Signed)
 Foley has been replaced.

## 2024-01-17 NOTE — ED Notes (Signed)
 Pt transported to purple zone at this time by Lucie, RN via wheelchair and assisted into bed

## 2024-01-17 NOTE — ED Notes (Signed)
 Pt to IR with IR staff on stretcher for suprapubic catheter replacement

## 2024-01-17 NOTE — Procedures (Signed)
 Interventional Radiology Procedure Note  Procedure: FLUORO 16 FR SP TUBE REPLACEMENT    Complications: None  Estimated Blood Loss:  0  Findings: 16 FR COUNCIL TIP TUBE    M. FREDERIC SPECKING, MD

## 2024-01-17 NOTE — Consult Note (Signed)
 Chief Complaint: Patient was seen in consultation today for  Chief Complaint  Patient presents with   Suprapubic Catheter Problem    Referring Physician(s): Dr. Ruthe  Supervising Physician: Vanice Revel  Patient Status: [facility] - ED  History of Present Illness: Theodore King is a 78 y.o. male with a history of urinary retention s/p placement of a suprapubic catheter in IR July 18, 2023. This tube was exchanged 09/06/23. Patient presents to the ED after the suprapubic catheter was inadvertently removed. He is poor historian, but says the catheter came out yesterday. IR asked to evaluate patient for replacement. IR tech attempted to place foley catheter in the suprapubic site in the  ED without success, pt now to IR dept for replacement. Pt has not eaten since 0800 today.  Today pt with tenderness around prior suprapubic catheter insertion site. No other complaints.    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   SOB (shortness of breath) 03/23/2018   Unspecified vitamin D  deficiency    Unspecified vitamin D  deficiency    Vertigo, benign paroxysmal 11/05/2016   Vitamin D  deficiency 05/18/2013    Past Surgical History:  Procedure Laterality Date   APPENDECTOMY     COLONOSCOPY WITH PROPOFOL  N/A 10/26/2021   Procedure: COLONOSCOPY WITH PROPOFOL ;  Surgeon: Abran Norleen SAILOR, MD;  Location: [facility];  Service: Gastroenterology;  Laterality:  N/A;   IR CATHETER TUBE CHANGE  09/06/2023   POLYPECTOMY  10/26/2021   Procedure: POLYPECTOMY;  Surgeon: Abran Norleen SAILOR, MD;  Location: [facility];  Service: Gastroenterology;;   MARCELINE CUFF REPAIR     right    Allergies: Patient has no known allergies.  Medications: Prior to Admission medications   Medication Sig Start Date End Date Taking? Authorizing Provider  acetaminophen  (TYLENOL ) 500 MG tablet Take 1,000 mg by mouth 2 (two) times daily.    [provider]  albuterol  (VENTOLIN  HFA) 108 (90 Base) MCG/ACT inhaler Inhale 2 puffs into the lungs every 6 (six) hours as needed for wheezing.    [provider]  amLODipine  (NORVASC ) 10 MG tablet Take 1 tablet (10 mg total) by mouth daily. 07/20/23   Amin, Ankit C, MD  bisacodyl  (DULCOLAX) 5 MG EC tablet Take 1 tablet (5 mg total) by mouth daily as needed for moderate constipation. 08/18/23   Barbarann Nest, MD  carbidopa -levodopa  (SINEMET  IR) 25-100 MG tablet Take 2 tablets at 7 AM, 2 tablets at 10 AM, 2 tablets at 2 PM and 1 tablet at 6 PM Patient taking differently: Take 2 tablets by mouth 3 (three) times daily. Take 2 tablets in the a.m; 2 tablets in the afternoon; and 2 tablets at night/before bed. 09/24/21   Penumalli, Vikram R, MD  Cholecalciferol 1.25 MG (50000 UT) capsule Take 50,000 Units by mouth daily.    [provider]  glycopyrrolate  (ROBINUL ) 1 MG tablet Take 1 tablet (1 mg total) by mouth 2 (two) times daily. 09/24/21   Penumalli, Vikram  R, MD  OVER THE COUNTER MEDICATION Take 120 mLs by mouth See admin instructions. MedPass- Drink 120 ml's by mouth once a day if Ensure is not available    [provider]  oxyCODONE  (OXY IR/ROXICODONE ) 5 MG immediate release tablet Take 1 tablet (5 mg total) by mouth every 6 (six) hours as needed for severe pain (pain score 7-10). 07/19/23   Amin, Ankit C, MD  senna-docusate (SENOKOT-S) 8.6-50 MG tablet Take 1 tablet by mouth at bedtime as needed for mild  constipation. 08/18/23   Barbarann Nest, MD  traZODone  (DESYREL ) 50 MG tablet Take 50 mg by mouth at bedtime.    [provider]  Vitamin D , Ergocalciferol , (DRISDOL) 1.25 MG (50000 UNIT) CAPS capsule Take 50,000 Units by mouth See admin instructions. Take 50,000 units by mouth on the 3rd of every month    [provider]     Family History  Problem Relation Age of Onset   Cancer Mother    Cancer Father    Stroke Brother    Cancer Sister    Healthy Daughter    Cancer Sister    Lung cancer Brother    Healthy Daughter    Colon cancer Neg Hx    Esophageal cancer Neg Hx    Rectal cancer Neg Hx    Stomach cancer Neg Hx     Social History   Socioeconomic History   Marital status: Single    Spouse name: Not on file   Number of children: 3   Years of education: 11th   Highest education level: 11th grade  Occupational History    Employer: RETIRED  Tobacco Use   Smoking status: Former    Current packs/day: 0.00    Average packs/day: 0.5 packs/day for 20.8 years (10.4 ttl pk-yrs)    Types: Cigarettes    Start date: 08/14/1953    Quit date: 05/23/1974    Years since quitting: 49.6   Smokeless tobacco: Never  Vaping Use   Vaping status: Never Used  Substance and Sexual Activity   Alcohol use: Not Currently   Drug use: No   Sexual activity: Never  Other Topics Concern   Not on file  Social History Narrative   09/24/21 lives with dgtr [name]   Social Drivers of Health   Financial Resource Strain: Not on file  Food Insecurity: Patient Unable To Answer (08/15/2023)   Hunger Vital Sign    Worried About Running Out of Food in the Last Year: Patient unable to answer    Ran Out of Food in the Last Year: Patient unable to answer  Transportation Needs: Patient Unable To Answer (08/15/2023)   PRAPARE - Transportation    Lack of Transportation (Medical): Patient unable to answer    Lack of Transportation (Non-Medical): Patient unable to answer  Physical Activity: Not  on file  Stress: Not on file  Social Connections: Patient Unable To Answer (08/15/2023)   Social Connection and Isolation Panel    Frequency of Communication with Friends and Family: Patient unable to answer    Frequency of Social Gatherings with Friends and Family: Patient unable to answer    Attends Religious Services: Patient unable to answer    Active Member of Clubs or Organizations: Patient unable to answer    Attends Banker Meetings: Patient unable to answer    Marital Status: Patient unable to answer    Review of Systems: A 12 point ROS discussed and pertinent positives are indicated in the HPI  above.  All other systems are negative.    Vital Signs: BP (!) 130/55 (BP Location: Left Arm)   Pulse 63   Temp 98.2 F (36.8 C)   Resp 18   SpO2 100%   Physical Exam Vitals and nursing note reviewed.  Constitutional:      General: He is not in acute distress. HENT:     Mouth/Throat:     Mouth: Mucous membranes are moist.     Pharynx: Oropharynx is clear.  Cardiovascular:     Rate and Rhythm: Normal rate and regular rhythm.  Pulmonary:     Effort: Pulmonary effort is normal.     Breath sounds: Normal breath sounds.  Abdominal:     Palpations: Abdomen is soft.     Comments: + ttp around prior suprapubic catheter insertion site. Small amount of bloody fluid surrounding.   Musculoskeletal:     Right lower leg: No edema.     Left lower leg: No edema.  Skin:    General: Skin is warm and dry.  Neurological:     Mental Status: He is alert. Mental status is at baseline.     Comments: Oriented to person and time, not place. Hx of dementia     Imaging: No results found.  Labs:  CBC: Recent Labs    08/15/23 0617 08/16/23 0527 08/18/23 0932 09/06/23 1143  WBC 8.4 8.1 5.9 7.8  HGB 10.8* 10.9* 10.6* 10.8*  HCT 34.5* 33.2* 32.7* 35.5*  PLT 159 172 163 156    COAGS: Recent Labs    01/19/23 0948 07/17/23 0812 09/06/23 1143  INR 1.3* 1.2 1.1  APTT  35  --   --     BMP: Recent Labs    08/16/23 0527 08/17/23 0435 08/18/23 0932 09/06/23 1144  NA 137 136 140 136  K 4.1 4.4 4.4 5.0  CL 107 111 110 107  CO2 21* 21* 21* 21*  GLUCOSE 124* 94 105* 86  BUN 35* 36* 27* 31*  CALCIUM 9.1 8.8* 9.1 9.1  CREATININE 2.15* 2.69* 1.81* 1.90*  GFRNONAA 31* 23* 38* 35*    LIVER FUNCTION TESTS: Recent Labs    01/18/23 1940 01/20/23 0953 07/18/23 0445 08/14/23 1139 08/15/23 0617 08/16/23 0527 08/17/23 0435  BILITOT 1.7*  --  2.2* 1.6* 1.9*  --   --   AST 13*  --  22 13* 15  --   --   ALT 5  --  5 5 10   --   --   ALKPHOS 84  --  63 64 66  --   --   PROT 6.8  --  7.1 7.3 7.1  --   --   ALBUMIN 3.0*   < > 3.1* 3.5 3.4* 3.5 3.1*   < > = values in this interval not displayed.    TUMOR MARKERS: No results for input(s): AFPTM, CEA, CA199, CHROMGRNA in the last 8760 hours.  Assessment and Plan:  Urinary Retention s/p suprapubic catheter placement; tube inadvertently removed: Theodore King, 78 year old male, is tentatively scheduled today for an image-guided suprapubic catheter placement. Procedure was discussed over the phone with the patient's daughter, Ladarian Bonczek.   Risks and benefits discussed with the patient including bleeding, infection, damage to adjacent structures, bowel perforation/fistula connection, and sepsis.  All of the patient's and family questions were answered, patient and family is agreeable to proceed.  Consent obtained from family, signed and in IR.   Thank you for this interesting consult.  I greatly  enjoyed meeting Theodore King and look forward to participating in their care.  A copy of this report was sent to the requesting provider on this date.  Electronically Signed: Kimble Clas, PA-C 01/17/2024, 4:24 PM   I spent a total of 20 Minutes    in face to face in clinical consultation, greater than 50% of which was counseling/coordinating care for urinary retention.

## 2024-01-17 NOTE — ED Provider Notes (Signed)
 [place] EMERGENCY DEPARTMENT AT [facility] Provider Note   CSN: [id number] Arrival date & time: 01/17/24  1150     Patient presents with: Suprapubic Catheter Problem    Theodore King is a 78 y.o. male.   Patient here for the supra Foley catheter placement.  Somehow came out overnight and his living facility.  History of dementia.  Patient comes from [place].  Unremarkable vitals with EMS.  Has history of Parkinson's BPH.  The history is provided by the patient.       Prior to Admission medications   Medication Sig Start Date End Date Taking? Authorizing Provider  acetaminophen  (TYLENOL ) 500 MG tablet Take 1,000 mg by mouth 2 (two) times daily.    [provider]  albuterol  (VENTOLIN  HFA) 108 (90 Base) MCG/ACT inhaler Inhale 2 puffs into the lungs every 6 (six) hours as needed for wheezing.    [provider]  amLODipine  (NORVASC ) 10 MG tablet Take 1 tablet (10 mg total) by mouth daily. 07/20/23   Amin, Ankit C, MD  bisacodyl  (DULCOLAX) 5 MG EC tablet Take 1 tablet (5 mg total) by mouth daily as needed for moderate constipation. 08/18/23   Barbarann Nest, MD  carbidopa -levodopa  (SINEMET  IR) 25-100 MG tablet Take 2 tablets at 7 AM, 2 tablets at 10 AM, 2 tablets at 2 PM and 1 tablet at 6 PM Patient taking differently: Take 2 tablets by mouth 3 (three) times daily. Take 2 tablets in the a.m; 2 tablets in the afternoon; and 2 tablets at night/before bed. 09/24/21   Penumalli, Vikram R, MD  Cholecalciferol 1.25 MG (50000 UT) capsule Take 50,000 Units by mouth daily.    [provider]  glycopyrrolate  (ROBINUL ) 1 MG tablet Take 1 tablet (1 mg total) by mouth 2 (two) times daily. 09/24/21   Penumalli, Vikram R, MD  OVER THE COUNTER MEDICATION Take 120 mLs by mouth See admin instructions. MedPass- Drink 120 ml's by mouth once a day if Ensure is not available    [provider]  oxyCODONE  (OXY IR/ROXICODONE ) 5 MG immediate release tablet Take  1 tablet (5 mg total) by mouth every 6 (six) hours as needed for severe pain (pain score 7-10). 07/19/23   Amin, Ankit C, MD  senna-docusate (SENOKOT-S) 8.6-50 MG tablet Take 1 tablet by mouth at bedtime as needed for mild constipation. 08/18/23   Barbarann Nest, MD  traZODone  (DESYREL ) 50 MG tablet Take 50 mg by mouth at bedtime.    [provider]  Vitamin D , Ergocalciferol , (DRISDOL) 1.25 MG (50000 UNIT) CAPS capsule Take 50,000 Units by mouth See admin instructions. Take 50,000 units by mouth on the 3rd of every month    [provider]    Allergies: Patient has no known allergies.    Review of Systems  Updated Vital Signs BP (!) 166/88   Pulse 68   Temp 98.2 F (36.8 C)   Resp 16   SpO2 99%   Physical Exam Vitals and nursing note reviewed.  Constitutional:      General: He is not in acute distress.    Appearance: He is well-developed. He is not ill-appearing.  HENT:     Head: Normocephalic and atraumatic.  Eyes:     Conjunctiva/sclera: Conjunctivae normal.     Pupils: Pupils are equal, round, and reactive to light.  Cardiovascular:     Rate and Rhythm: Normal rate and regular rhythm.     Pulses: Normal pulses.     Heart  sounds: Normal heart sounds. No murmur heard. Pulmonary:     Effort: Pulmonary effort is normal. No respiratory distress.     Breath sounds: Normal breath sounds.  Abdominal:     General: Abdomen is flat.     Palpations: Abdomen is soft.     Tenderness: There is no abdominal tenderness.     Comments: Suprapubic Foley catheter site is clean dry and intact  Musculoskeletal:        General: No swelling.     Cervical back: Normal range of motion and neck supple.  Skin:    General: Skin is warm and dry.     Capillary Refill: Capillary refill takes less than 2 seconds.  Neurological:     General: No focal deficit present.     Mental Status: He is alert.  Psychiatric:        Mood and Affect: Mood normal.     (all labs ordered are  listed, but only abnormal results are displayed) Labs Reviewed - No data to display  EKG: None  Radiology: IR CYSTOSTOMY TUBE CHANGE COMPLICATED W IMG Result Date: 01/17/2024 INDICATION: CHRONIC SUPRAPUBIC CATHETER, ACCIDENTALLY REMOVED EXAM: FLUOROSCOPIC REPLACEMENT OF THE 16 [place] SOFT BALLOON TIP SUPRAPUBIC CATHETER COMPARISON:  09/06/2023 MEDICATIONS: 1% LIDOCAINE  LOCAL ANESTHESIA/SEDATION: Moderate (conscious) sedation was employed during this procedure. A total of Versed  1.5 mg and Fentanyl  75 mcg was administered intravenously by the radiology nurse. Total intra-service moderate Sedation Time: 11 minutes. The patient's level of consciousness and vital signs were monitored continuously by radiology nursing throughout the procedure under my direct supervision. CONTRAST:  15 cc omni 300-administered into the collecting system(s) FLUOROSCOPY: Radiation Exposure Index (as provided by the fluoroscopic device): 6.0 mGy Kerma COMPLICATIONS: None immediate. PROCEDURE: Informed written consent was obtained from the patient after a thorough discussion of the procedural risks, benefits and alternatives. All questions were addressed. Maximal Sterile Barrier Technique was utilized including caps, mask, sterile gowns, sterile gloves, sterile drape, hand hygiene and skin antiseptic. A timeout was performed prior to the initiation of the procedure. Under sterile conditions and local anesthesia, the existing percutaneous tract was recannulated with a 5 [place] Kumpe catheter. Glidewire utilized to advance the access into the bladder. Contrast injection confirms position in the bladder. Images obtained for documentation. Amplatz guidewire inserted into the bladder. 16 [place] balloon soft tip catheter was loaded onto the 8 mm x 8 cm balloon. Percutaneous tract dilatation performed from the skin into the bladder by inflating the balloon. Balloon was then deflated and the Council tip catheter was advanced  into the bladder. Retention balloon inflated with 10 cc saline. This was retracted against anterior bladder wall. 8 mm balloon was deflated and removed along with the Amplatz guidewire. Contrast injection confirms position of the 16 [place] tip catheter within the bladder. Sterile dressing applied. Gravity drainage bag connected. No immediate complication. Patient tolerated the procedure well. IMPRESSION: Successful fluoroscopic replacement of the 23 [place] soft balloon tip suprapubic catheter. Electronically Signed   By: CHRISTELLA.  Shick M.D.   On: 01/17/2024 16:57     Procedures   Medications Ordered in the ED  iohexol  (OMNIPAQUE ) 300 MG/ML solution 50 mL (15 mLs Per Tube Contrast Given 01/17/24 1642)  lidocaine  (XYLOCAINE ) 1 % (with pres) injection 20 mL (10 mLs Intradermal Given 01/17/24 1642)  midazolam  (VERSED ) injection (0.5 mg Intravenous Given 01/17/24 1639)  fentaNYL  (SUBLIMAZE ) injection (25 mcg Intravenous Given 01/17/24 1639)  Medical Decision Making Amount and/or Complexity of Data Reviewed Radiology: ordered.   Theodore King is here with suprapubic Foley catheter needing to be replaced.  He has a history of Parkinson's and dementia.  Somehow his Foley catheter came out overnight.  Unable to pass a Foley catheter.  Contacted IR who will take him for replacement.  He is overall comfortable.  Unremarkable vitals.  Well-appearing.  No abdominal pain.  New Foley catheter has been placed by IR.  Patient safe for discharge back to facility.  This chart was dictated using voice recognition software.  Despite best efforts to proofread,  errors can occur which can change the documentation meaning.      Final diagnoses:  Problem with Foley catheter, initial encounter [facility])    ED Discharge Orders     None          Ruthe Cornet, DO 01/17/24 1741

## 2024-01-17 NOTE — ED Notes (Signed)
 PTAR at bedside to have pt transported
